# Patient Record
Sex: Male | Born: 1945
Health system: Southern US, Community
[De-identification: ages and names within clinical notes are randomized; demographics above are authoritative.]

## PROBLEM LIST (undated history)

## (undated) DIAGNOSIS — E1142 Type 2 diabetes mellitus with diabetic polyneuropathy: Secondary | ICD-10-CM

## (undated) DIAGNOSIS — G4730 Sleep apnea, unspecified: Secondary | ICD-10-CM

## (undated) DIAGNOSIS — IMO0002 Reserved for concepts with insufficient information to code with codable children: Secondary | ICD-10-CM

## (undated) DIAGNOSIS — I10 Essential (primary) hypertension: Secondary | ICD-10-CM

## (undated) DIAGNOSIS — F32A Depression, unspecified: Secondary | ICD-10-CM

## (undated) DIAGNOSIS — I2510 Atherosclerotic heart disease of native coronary artery without angina pectoris: Secondary | ICD-10-CM

## (undated) DIAGNOSIS — E669 Obesity, unspecified: Secondary | ICD-10-CM

## (undated) DIAGNOSIS — K219 Gastro-esophageal reflux disease without esophagitis: Secondary | ICD-10-CM

## (undated) DIAGNOSIS — E785 Hyperlipidemia, unspecified: Secondary | ICD-10-CM

## (undated) DIAGNOSIS — I4892 Unspecified atrial flutter: Secondary | ICD-10-CM

## (undated) DIAGNOSIS — F4329 Adjustment disorder with other symptoms: Secondary | ICD-10-CM

## (undated) DIAGNOSIS — M1990 Unspecified osteoarthritis, unspecified site: Secondary | ICD-10-CM

## (undated) DIAGNOSIS — E1165 Type 2 diabetes mellitus with hyperglycemia: Secondary | ICD-10-CM

## (undated) DIAGNOSIS — F329 Major depressive disorder, single episode, unspecified: Secondary | ICD-10-CM

## (undated) DIAGNOSIS — N529 Male erectile dysfunction, unspecified: Secondary | ICD-10-CM

## (undated) DIAGNOSIS — B0229 Other postherpetic nervous system involvement: Secondary | ICD-10-CM

## (undated) HISTORY — DX: Unspecified atrial flutter: I48.92

## (undated) HISTORY — DX: Reserved for concepts with insufficient information to code with codable children: IMO0002

## (undated) HISTORY — DX: Hyperlipidemia, unspecified: E78.5

## (undated) HISTORY — DX: Type 2 diabetes mellitus with hyperglycemia: E11.65

## (undated) HISTORY — PX: SHOULDER SURGERY: SHX246

## (undated) HISTORY — DX: Adjustment disorder with other symptoms: F43.29

## (undated) HISTORY — DX: Major depressive disorder, single episode, unspecified: F32.9

## (undated) HISTORY — DX: Other postherpetic nervous system involvement: B02.29

## (undated) HISTORY — DX: Male erectile dysfunction, unspecified: N52.9

## (undated) HISTORY — PX: REPLACEMENT TOTAL KNEE BILATERAL: SUR1225

## (undated) HISTORY — DX: Depression, unspecified: F32.A

## (undated) HISTORY — DX: Sleep apnea, unspecified: G47.30

## (undated) HISTORY — DX: Unspecified osteoarthritis, unspecified site: M19.90

## (undated) HISTORY — DX: Type 2 diabetes mellitus with diabetic polyneuropathy: E11.42

## (undated) HISTORY — DX: Obesity, unspecified: E66.9

## (undated) HISTORY — DX: Gastro-esophageal reflux disease without esophagitis: K21.9

## (undated) HISTORY — DX: Essential (primary) hypertension: I10

## (undated) HISTORY — DX: Atherosclerotic heart disease of native coronary artery without angina pectoris: I25.10

---

## 1976-11-06 ENCOUNTER — Encounter: Payer: Self-pay | Admitting: Cardiology

## 1994-03-19 HISTORY — PX: CORONARY ARTERY BYPASS GRAFT: SHX141

## 1995-03-07 ENCOUNTER — Encounter (INDEPENDENT_AMBULATORY_CARE_PROVIDER_SITE_OTHER): Payer: Self-pay | Admitting: *Deleted

## 1998-03-07 ENCOUNTER — Ambulatory Visit (HOSPITAL_COMMUNITY): Admission: RE | Admit: 1998-03-07 | Discharge: 1998-03-07 | Payer: Self-pay | Admitting: Cardiovascular Disease

## 1998-03-15 ENCOUNTER — Encounter: Payer: Self-pay | Admitting: Orthopedic Surgery

## 1998-03-22 ENCOUNTER — Inpatient Hospital Stay (HOSPITAL_COMMUNITY): Admission: RE | Admit: 1998-03-22 | Discharge: 1998-03-24 | Payer: Self-pay | Admitting: Orthopedic Surgery

## 1998-03-29 ENCOUNTER — Ambulatory Visit (HOSPITAL_COMMUNITY): Admission: RE | Admit: 1998-03-29 | Discharge: 1998-03-29 | Payer: Self-pay | Admitting: Orthopedic Surgery

## 1998-04-06 ENCOUNTER — Encounter: Admission: RE | Admit: 1998-04-06 | Discharge: 1998-07-05 | Payer: Self-pay | Admitting: Orthopedic Surgery

## 2000-03-28 LAB — HM COLONOSCOPY

## 2000-05-09 ENCOUNTER — Ambulatory Visit (HOSPITAL_COMMUNITY): Admission: RE | Admit: 2000-05-09 | Discharge: 2000-05-09 | Payer: Self-pay | Admitting: Gastroenterology

## 2000-05-09 ENCOUNTER — Encounter: Payer: Self-pay | Admitting: Gastroenterology

## 2001-07-07 ENCOUNTER — Encounter: Payer: Self-pay | Admitting: Orthopedic Surgery

## 2001-07-07 ENCOUNTER — Ambulatory Visit (HOSPITAL_COMMUNITY): Admission: RE | Admit: 2001-07-07 | Discharge: 2001-07-07 | Payer: Self-pay | Admitting: Cardiovascular Disease

## 2001-07-14 ENCOUNTER — Inpatient Hospital Stay (HOSPITAL_COMMUNITY): Admission: RE | Admit: 2001-07-14 | Discharge: 2001-07-18 | Payer: Self-pay | Admitting: Orthopedic Surgery

## 2001-08-05 ENCOUNTER — Encounter: Admission: RE | Admit: 2001-08-05 | Discharge: 2001-09-26 | Payer: Self-pay | Admitting: Orthopedic Surgery

## 2002-01-29 ENCOUNTER — Ambulatory Visit (HOSPITAL_COMMUNITY): Admission: RE | Admit: 2002-01-29 | Discharge: 2002-01-29 | Payer: Self-pay | Admitting: Cardiology

## 2002-01-29 ENCOUNTER — Encounter: Payer: Self-pay | Admitting: Cardiology

## 2002-03-04 ENCOUNTER — Encounter: Payer: Self-pay | Admitting: Internal Medicine

## 2002-03-04 ENCOUNTER — Encounter: Admission: RE | Admit: 2002-03-04 | Discharge: 2002-03-04 | Payer: Self-pay | Admitting: Internal Medicine

## 2004-05-17 HISTORY — PX: KNEE ARTHROSCOPY: SHX127

## 2004-06-05 ENCOUNTER — Ambulatory Visit: Payer: Self-pay | Admitting: Internal Medicine

## 2004-06-06 ENCOUNTER — Ambulatory Visit: Payer: Self-pay

## 2004-06-13 ENCOUNTER — Ambulatory Visit: Payer: Self-pay | Admitting: *Deleted

## 2004-06-14 ENCOUNTER — Ambulatory Visit (HOSPITAL_COMMUNITY): Admission: RE | Admit: 2004-06-14 | Discharge: 2004-06-14 | Payer: Self-pay | Admitting: Orthopedic Surgery

## 2005-02-13 ENCOUNTER — Ambulatory Visit: Payer: Self-pay | Admitting: Internal Medicine

## 2005-02-16 ENCOUNTER — Ambulatory Visit: Payer: Self-pay | Admitting: Internal Medicine

## 2005-03-14 ENCOUNTER — Ambulatory Visit: Payer: Self-pay | Admitting: *Deleted

## 2005-03-15 ENCOUNTER — Ambulatory Visit: Payer: Self-pay | Admitting: *Deleted

## 2005-03-16 ENCOUNTER — Ambulatory Visit: Payer: Self-pay | Admitting: Cardiology

## 2005-03-16 ENCOUNTER — Ambulatory Visit (HOSPITAL_COMMUNITY): Admission: RE | Admit: 2005-03-16 | Discharge: 2005-03-16 | Payer: Self-pay | Admitting: Cardiology

## 2005-03-21 ENCOUNTER — Ambulatory Visit: Payer: Self-pay | Admitting: Internal Medicine

## 2005-03-30 ENCOUNTER — Ambulatory Visit: Payer: Self-pay

## 2005-04-02 ENCOUNTER — Ambulatory Visit: Payer: Self-pay | Admitting: Internal Medicine

## 2005-04-02 ENCOUNTER — Ambulatory Visit: Admission: RE | Admit: 2005-04-02 | Discharge: 2005-04-02 | Payer: Self-pay | Admitting: Orthopedic Surgery

## 2005-04-16 ENCOUNTER — Ambulatory Visit: Payer: Self-pay | Admitting: Internal Medicine

## 2005-04-17 ENCOUNTER — Ambulatory Visit: Payer: Self-pay | Admitting: Internal Medicine

## 2005-04-26 ENCOUNTER — Ambulatory Visit: Payer: Self-pay | Admitting: Internal Medicine

## 2005-05-02 ENCOUNTER — Ambulatory Visit: Payer: Self-pay | Admitting: Internal Medicine

## 2005-05-02 ENCOUNTER — Inpatient Hospital Stay (HOSPITAL_COMMUNITY): Admission: RE | Admit: 2005-05-02 | Discharge: 2005-05-09 | Payer: Self-pay | Admitting: Orthopedic Surgery

## 2005-05-23 ENCOUNTER — Ambulatory Visit: Payer: Self-pay | Admitting: Internal Medicine

## 2005-06-05 ENCOUNTER — Ambulatory Visit: Payer: Self-pay | Admitting: Internal Medicine

## 2005-06-15 ENCOUNTER — Ambulatory Visit: Payer: Self-pay | Admitting: Internal Medicine

## 2005-07-18 ENCOUNTER — Encounter: Payer: Self-pay | Admitting: Cardiovascular Disease

## 2006-02-20 ENCOUNTER — Ambulatory Visit: Payer: Self-pay | Admitting: Internal Medicine

## 2006-02-25 ENCOUNTER — Ambulatory Visit: Payer: Self-pay | Admitting: Internal Medicine

## 2006-04-05 ENCOUNTER — Ambulatory Visit: Payer: Self-pay | Admitting: Internal Medicine

## 2006-06-10 DIAGNOSIS — E1139 Type 2 diabetes mellitus with other diabetic ophthalmic complication: Secondary | ICD-10-CM | POA: Insufficient documentation

## 2006-06-10 DIAGNOSIS — E1165 Type 2 diabetes mellitus with hyperglycemia: Secondary | ICD-10-CM

## 2006-06-17 ENCOUNTER — Ambulatory Visit: Payer: Self-pay | Admitting: Internal Medicine

## 2006-06-17 LAB — CONVERTED CEMR LAB
BUN: 13 mg/dL (ref 6–23)
CO2: 29 meq/L (ref 19–32)
Calcium: 9.1 mg/dL (ref 8.4–10.5)
Chloride: 106 meq/L (ref 96–112)
Cholesterol: 187 mg/dL (ref 0–200)
Creatinine, Ser: 0.8 mg/dL (ref 0.4–1.5)
Creatinine,U: 319.1 mg/dL
GFR calc Af Amer: 126 mL/min
GFR calc non Af Amer: 104 mL/min
Glucose, Bld: 196 mg/dL — ABNORMAL HIGH (ref 70–99)
HDL: 55.5 mg/dL (ref >39.0)
Hgb A1c MFr Bld: 12 % — ABNORMAL HIGH (ref 4.6–6.0)
LDL Cholesterol: 116 mg/dL — ABNORMAL HIGH (ref 0–99)
Microalb Creat Ratio: 13.2 mg/g (ref 0.0–30.0)
Microalb, Ur: 4.2 mg/dL — ABNORMAL HIGH (ref 0.0–1.9)
Potassium: 3.5 meq/L (ref 3.5–5.1)
Sodium: 142 meq/L (ref 135–145)
Total CHOL/HDL Ratio: 3.4
Triglycerides: 80 mg/dL (ref 0–149)
VLDL: 16 mg/dL (ref 0–40)

## 2006-10-09 ENCOUNTER — Encounter: Payer: Self-pay | Admitting: Internal Medicine

## 2006-10-09 DIAGNOSIS — E669 Obesity, unspecified: Secondary | ICD-10-CM | POA: Insufficient documentation

## 2006-10-09 DIAGNOSIS — K219 Gastro-esophageal reflux disease without esophagitis: Secondary | ICD-10-CM | POA: Insufficient documentation

## 2006-10-09 DIAGNOSIS — I10 Essential (primary) hypertension: Secondary | ICD-10-CM | POA: Insufficient documentation

## 2006-10-09 DIAGNOSIS — I2510 Atherosclerotic heart disease of native coronary artery without angina pectoris: Secondary | ICD-10-CM | POA: Insufficient documentation

## 2006-10-09 DIAGNOSIS — E785 Hyperlipidemia, unspecified: Secondary | ICD-10-CM | POA: Insufficient documentation

## 2007-02-10 ENCOUNTER — Ambulatory Visit: Payer: Self-pay | Admitting: Internal Medicine

## 2007-02-10 DIAGNOSIS — F528 Other sexual dysfunction not due to a substance or known physiological condition: Secondary | ICD-10-CM | POA: Insufficient documentation

## 2007-02-10 LAB — CONVERTED CEMR LAB
ALT: 24 units/L (ref 0–53)
AST: 19 units/L (ref 0–37)
Albumin: 4.1 g/dL (ref 3.5–5.2)
Alkaline Phosphatase: 74 units/L (ref 39–117)
BUN: 16 mg/dL (ref 6–23)
Bilirubin, Direct: 0.2 mg/dL (ref 0.0–0.3)
CO2: 29 meq/L (ref 19–32)
Calcium: 9.7 mg/dL (ref 8.4–10.5)
Chloride: 105 meq/L (ref 96–112)
Cholesterol: 169 mg/dL (ref 0–200)
Creatinine, Ser: 0.9 mg/dL (ref 0.4–1.5)
Creatinine,U: 195.4 mg/dL
GFR calc Af Amer: 110 mL/min
GFR calc non Af Amer: 91 mL/min
Glucose, Bld: 259 mg/dL — ABNORMAL HIGH (ref 70–99)
HDL: 54.9 mg/dL (ref >39.0)
Hgb A1c MFr Bld: 10 % — ABNORMAL HIGH (ref 4.6–6.0)
LDL Cholesterol: 103 mg/dL — ABNORMAL HIGH (ref 0–99)
Microalb Creat Ratio: 22.5 mg/g (ref 0.0–30.0)
Microalb, Ur: 4.4 mg/dL — ABNORMAL HIGH (ref 0.0–1.9)
Potassium: 4.7 meq/L (ref 3.5–5.1)
Sodium: 140 meq/L (ref 135–145)
TSH: 0.94 microintl units/mL (ref 0.35–5.50)
Testosterone: 466.74 ng/dL (ref 350.00–890)
Total Bilirubin: 1.1 mg/dL (ref 0.3–1.2)
Total CHOL/HDL Ratio: 3.1
Total Protein: 6.8 g/dL (ref 6.0–8.3)
Triglycerides: 54 mg/dL (ref 0–149)
VLDL: 11 mg/dL (ref 0–40)

## 2007-02-11 ENCOUNTER — Encounter: Payer: Self-pay | Admitting: Internal Medicine

## 2007-03-18 ENCOUNTER — Ambulatory Visit: Payer: Self-pay | Admitting: Internal Medicine

## 2007-05-27 ENCOUNTER — Ambulatory Visit: Payer: Self-pay | Admitting: Internal Medicine

## 2007-05-27 LAB — CONVERTED CEMR LAB
BUN: 19 mg/dL (ref 6–23)
Bacteria, UA: NEGATIVE
Bilirubin Urine: NEGATIVE
CO2: 32 meq/L (ref 19–32)
Calcium: 9.5 mg/dL (ref 8.4–10.5)
Chloride: 106 meq/L (ref 96–112)
Creatinine, Ser: 0.9 mg/dL (ref 0.4–1.5)
Crystals: NEGATIVE
GFR calc Af Amer: 110 mL/min
GFR calc non Af Amer: 91 mL/min
Glucose, Bld: 167 mg/dL — ABNORMAL HIGH (ref 70–99)
Hemoglobin, Urine: NEGATIVE
Hgb A1c MFr Bld: 8.3 % — ABNORMAL HIGH (ref 4.6–6.0)
Ketones, ur: NEGATIVE mg/dL
Leukocytes, UA: NEGATIVE
Nitrite: NEGATIVE
Potassium: 3.8 meq/L (ref 3.5–5.1)
RBC / HPF: NONE SEEN
Sodium: 143 meq/L (ref 135–145)
Specific Gravity, Urine: >=1.03 (ref 1.000–1.03)
Squamous Epithelial / LPF: NEGATIVE /lpf
Total Protein, Urine: NEGATIVE mg/dL
Urine Glucose: NEGATIVE mg/dL
Urobilinogen, UA: 0.2 (ref 0.0–1.0)
pH: 5.5 (ref 5.0–8.0)

## 2007-06-03 ENCOUNTER — Ambulatory Visit: Payer: Self-pay | Admitting: Internal Medicine

## 2007-06-12 ENCOUNTER — Telehealth: Payer: Self-pay | Admitting: Internal Medicine

## 2007-08-18 ENCOUNTER — Ambulatory Visit: Payer: Self-pay | Admitting: Internal Medicine

## 2007-08-20 ENCOUNTER — Encounter (INDEPENDENT_AMBULATORY_CARE_PROVIDER_SITE_OTHER): Payer: Self-pay | Admitting: *Deleted

## 2007-11-18 ENCOUNTER — Ambulatory Visit: Payer: Self-pay | Admitting: Internal Medicine

## 2007-11-18 DIAGNOSIS — H109 Unspecified conjunctivitis: Secondary | ICD-10-CM | POA: Insufficient documentation

## 2007-11-18 LAB — CONVERTED CEMR LAB
ALT: 28 units/L (ref 0–53)
AST: 22 units/L (ref 0–37)
BUN: 22 mg/dL (ref 6–23)
CO2: 29 meq/L (ref 19–32)
Calcium: 9.5 mg/dL (ref 8.4–10.5)
Chloride: 107 meq/L (ref 96–112)
Cholesterol: 158 mg/dL (ref 0–200)
Creatinine, Ser: 1 mg/dL (ref 0.4–1.5)
Creatinine,U: 167.5 mg/dL
GFR calc Af Amer: 97 mL/min
GFR calc non Af Amer: 80 mL/min
Glucose, Bld: 261 mg/dL — ABNORMAL HIGH (ref 70–99)
HDL: 51.5 mg/dL (ref >39.0)
LDL Cholesterol: 92 mg/dL (ref 0–99)
Microalb Creat Ratio: 4.8 mg/g (ref 0.0–30.0)
Microalb, Ur: 0.8 mg/dL (ref 0.0–1.9)
Potassium: 4.6 meq/L (ref 3.5–5.1)
Sodium: 140 meq/L (ref 135–145)
TSH: 0.9 microintl units/mL (ref 0.35–5.50)
Total CHOL/HDL Ratio: 3.1
Triglycerides: 75 mg/dL (ref 0–149)
VLDL: 15 mg/dL (ref 0–40)

## 2007-11-19 ENCOUNTER — Encounter: Payer: Self-pay | Admitting: Internal Medicine

## 2007-11-20 ENCOUNTER — Telehealth: Payer: Self-pay | Admitting: Internal Medicine

## 2008-02-09 ENCOUNTER — Telehealth (INDEPENDENT_AMBULATORY_CARE_PROVIDER_SITE_OTHER): Payer: Self-pay | Admitting: *Deleted

## 2008-02-11 ENCOUNTER — Ambulatory Visit: Payer: Self-pay | Admitting: Internal Medicine

## 2008-02-11 LAB — CONVERTED CEMR LAB: Hgb A1c MFr Bld: 8.7 % — ABNORMAL HIGH (ref 4.6–6.0)

## 2008-02-24 ENCOUNTER — Ambulatory Visit: Payer: Self-pay | Admitting: Internal Medicine

## 2008-02-24 DIAGNOSIS — E1165 Type 2 diabetes mellitus with hyperglycemia: Secondary | ICD-10-CM | POA: Insufficient documentation

## 2008-06-22 ENCOUNTER — Telehealth: Payer: Self-pay | Admitting: Internal Medicine

## 2008-07-01 ENCOUNTER — Encounter: Payer: Self-pay | Admitting: Internal Medicine

## 2008-07-02 ENCOUNTER — Encounter: Payer: Self-pay | Admitting: Internal Medicine

## 2008-08-23 ENCOUNTER — Telehealth: Payer: Self-pay | Admitting: Internal Medicine

## 2008-08-24 ENCOUNTER — Ambulatory Visit: Payer: Self-pay | Admitting: Internal Medicine

## 2008-08-24 LAB — CONVERTED CEMR LAB
ALT: 25 units/L (ref 0–53)
AST: 27 units/L (ref 0–37)
BUN: 19 mg/dL (ref 6–23)
CO2: 30 meq/L (ref 19–32)
Calcium: 8.9 mg/dL (ref 8.4–10.5)
Chloride: 113 meq/L — ABNORMAL HIGH (ref 96–112)
Cholesterol: 132 mg/dL (ref 0–200)
Creatinine, Ser: 0.8 mg/dL (ref 0.4–1.5)
GFR calc non Af Amer: 103.38 mL/min (ref >60)
Glucose, Bld: 107 mg/dL — ABNORMAL HIGH (ref 70–99)
HDL: 54.2 mg/dL (ref >39.00)
Hgb A1c MFr Bld: 8.4 % — ABNORMAL HIGH (ref 4.6–6.5)
LDL Cholesterol: 69 mg/dL (ref 0–99)
Potassium: 3.8 meq/L (ref 3.5–5.1)
Sodium: 144 meq/L (ref 135–145)
Total CHOL/HDL Ratio: 2
Triglycerides: 46 mg/dL (ref 0.0–149.0)
VLDL: 9.2 mg/dL (ref 0.0–40.0)

## 2008-08-25 ENCOUNTER — Ambulatory Visit: Payer: Self-pay | Admitting: Internal Medicine

## 2008-08-25 DIAGNOSIS — F4323 Adjustment disorder with mixed anxiety and depressed mood: Secondary | ICD-10-CM | POA: Insufficient documentation

## 2008-08-25 LAB — CONVERTED CEMR LAB
Creatinine,U: 174.4 mg/dL
Microalb Creat Ratio: 6.9 mg/g (ref 0.0–30.0)
Microalb, Ur: 1.2 mg/dL (ref 0.0–1.9)

## 2008-09-22 ENCOUNTER — Ambulatory Visit: Payer: Self-pay | Admitting: Internal Medicine

## 2008-09-22 ENCOUNTER — Ambulatory Visit (HOSPITAL_BASED_OUTPATIENT_CLINIC_OR_DEPARTMENT_OTHER): Admission: RE | Admit: 2008-09-22 | Discharge: 2008-09-22 | Payer: Self-pay | Admitting: Internal Medicine

## 2008-09-22 ENCOUNTER — Ambulatory Visit: Payer: Self-pay | Admitting: Diagnostic Radiology

## 2008-09-22 ENCOUNTER — Telehealth: Payer: Self-pay | Admitting: Internal Medicine

## 2008-09-22 DIAGNOSIS — S91109A Unspecified open wound of unspecified toe(s) without damage to nail, initial encounter: Secondary | ICD-10-CM | POA: Insufficient documentation

## 2008-11-19 ENCOUNTER — Ambulatory Visit: Payer: Self-pay | Admitting: Internal Medicine

## 2008-11-19 LAB — CONVERTED CEMR LAB
BUN: 18 mg/dL (ref 6–23)
CO2: 26 meq/L (ref 19–32)
Calcium: 9.5 mg/dL (ref 8.4–10.5)
Chloride: 104 meq/L (ref 96–112)
Creatinine, Ser: 0.88 mg/dL (ref 0.40–1.50)
Glucose, Bld: 146 mg/dL — ABNORMAL HIGH (ref 70–99)
Hgb A1c MFr Bld: 8.2 % — ABNORMAL HIGH (ref 4.6–6.1)
Microalb, Ur: 1.36 mg/dL (ref 0.00–1.89)
Potassium: 4.5 meq/L (ref 3.5–5.3)
Sodium: 142 meq/L (ref 135–145)
TSH: 1.4 microintl units/mL (ref 0.350–4.500)

## 2008-11-23 ENCOUNTER — Telehealth: Payer: Self-pay | Admitting: Internal Medicine

## 2008-11-25 ENCOUNTER — Ambulatory Visit: Payer: Self-pay | Admitting: Internal Medicine

## 2008-12-21 ENCOUNTER — Ambulatory Visit: Payer: Self-pay | Admitting: Internal Medicine

## 2008-12-21 DIAGNOSIS — L259 Unspecified contact dermatitis, unspecified cause: Secondary | ICD-10-CM | POA: Insufficient documentation

## 2008-12-24 ENCOUNTER — Ambulatory Visit: Payer: Self-pay | Admitting: Internal Medicine

## 2008-12-24 DIAGNOSIS — B029 Zoster without complications: Secondary | ICD-10-CM | POA: Insufficient documentation

## 2009-01-07 ENCOUNTER — Ambulatory Visit: Payer: Self-pay | Admitting: Internal Medicine

## 2009-01-13 ENCOUNTER — Ambulatory Visit: Payer: Self-pay | Admitting: Family

## 2009-01-13 DIAGNOSIS — B0229 Other postherpetic nervous system involvement: Secondary | ICD-10-CM | POA: Insufficient documentation

## 2009-01-27 ENCOUNTER — Telehealth: Payer: Self-pay | Admitting: Internal Medicine

## 2009-01-28 ENCOUNTER — Encounter: Admission: RE | Admit: 2009-01-28 | Discharge: 2009-01-28 | Payer: Self-pay | Admitting: Neurology

## 2009-01-30 ENCOUNTER — Emergency Department (HOSPITAL_COMMUNITY): Admission: EM | Admit: 2009-01-30 | Discharge: 2009-01-30 | Payer: Self-pay | Admitting: Emergency Medicine

## 2009-02-04 ENCOUNTER — Ambulatory Visit: Payer: Self-pay | Admitting: Internal Medicine

## 2009-02-15 ENCOUNTER — Encounter: Payer: Self-pay | Admitting: Internal Medicine

## 2009-02-16 ENCOUNTER — Encounter: Payer: Self-pay | Admitting: Internal Medicine

## 2009-02-17 ENCOUNTER — Ambulatory Visit: Payer: Self-pay | Admitting: Internal Medicine

## 2009-02-18 ENCOUNTER — Encounter: Payer: Self-pay | Admitting: Internal Medicine

## 2009-02-18 LAB — CONVERTED CEMR LAB
BUN: 16 mg/dL (ref 6–23)
CO2: 29 meq/L (ref 19–32)
Calcium: 8.9 mg/dL (ref 8.4–10.5)
Chloride: 104 meq/L (ref 96–112)
Creatinine, Ser: 0.87 mg/dL (ref 0.40–1.50)
Glucose, Bld: 178 mg/dL — ABNORMAL HIGH (ref 70–99)
Hgb A1c MFr Bld: 8.9 % — ABNORMAL HIGH (ref 4.6–6.1)
Potassium: 4.8 meq/L (ref 3.5–5.3)
Sodium: 142 meq/L (ref 135–145)

## 2009-02-24 ENCOUNTER — Ambulatory Visit: Payer: Self-pay | Admitting: Internal Medicine

## 2009-02-24 LAB — HM DIABETES FOOT EXAM

## 2009-03-15 ENCOUNTER — Telehealth: Payer: Self-pay | Admitting: Internal Medicine

## 2009-04-01 ENCOUNTER — Telehealth: Payer: Self-pay | Admitting: Internal Medicine

## 2009-05-13 ENCOUNTER — Encounter: Payer: Self-pay | Admitting: Internal Medicine

## 2009-05-17 ENCOUNTER — Ambulatory Visit: Payer: Self-pay | Admitting: Internal Medicine

## 2009-05-17 LAB — CONVERTED CEMR LAB
BUN: 17 mg/dL (ref 6–23)
CO2: 29 meq/L (ref 19–32)
Calcium: 9.2 mg/dL (ref 8.4–10.5)
Chloride: 103 meq/L (ref 96–112)
Creatinine, Ser: 0.84 mg/dL (ref 0.40–1.50)
Glucose, Bld: 89 mg/dL (ref 70–99)
Hgb A1c MFr Bld: 7.7 % — ABNORMAL HIGH (ref 4.6–6.1)
Potassium: 4.1 meq/L (ref 3.5–5.3)
Sodium: 143 meq/L (ref 135–145)

## 2009-05-24 ENCOUNTER — Ambulatory Visit: Payer: Self-pay | Admitting: Internal Medicine

## 2009-05-24 DIAGNOSIS — R0609 Other forms of dyspnea: Secondary | ICD-10-CM

## 2009-05-24 DIAGNOSIS — M79609 Pain in unspecified limb: Secondary | ICD-10-CM | POA: Insufficient documentation

## 2009-05-30 ENCOUNTER — Encounter: Payer: Self-pay | Admitting: Internal Medicine

## 2009-05-30 DIAGNOSIS — M1990 Unspecified osteoarthritis, unspecified site: Secondary | ICD-10-CM | POA: Insufficient documentation

## 2009-05-31 ENCOUNTER — Encounter: Payer: Self-pay | Admitting: Internal Medicine

## 2009-06-06 ENCOUNTER — Encounter: Payer: Self-pay | Admitting: Cardiology

## 2009-06-08 ENCOUNTER — Encounter: Payer: Self-pay | Admitting: Cardiology

## 2009-06-08 ENCOUNTER — Ambulatory Visit: Payer: Self-pay | Admitting: Cardiology

## 2009-06-08 DIAGNOSIS — R079 Chest pain, unspecified: Secondary | ICD-10-CM | POA: Insufficient documentation

## 2009-06-13 ENCOUNTER — Telehealth (INDEPENDENT_AMBULATORY_CARE_PROVIDER_SITE_OTHER): Payer: Self-pay | Admitting: *Deleted

## 2009-06-14 ENCOUNTER — Ambulatory Visit: Payer: Self-pay | Admitting: Cardiology

## 2009-06-14 ENCOUNTER — Ambulatory Visit (HOSPITAL_COMMUNITY): Admission: RE | Admit: 2009-06-14 | Discharge: 2009-06-14 | Payer: Self-pay | Admitting: Internal Medicine

## 2009-06-14 ENCOUNTER — Encounter: Payer: Self-pay | Admitting: Internal Medicine

## 2009-06-14 ENCOUNTER — Encounter (HOSPITAL_COMMUNITY): Admission: RE | Admit: 2009-06-14 | Discharge: 2009-08-17 | Payer: Self-pay | Admitting: Cardiology

## 2009-06-14 ENCOUNTER — Ambulatory Visit: Payer: Self-pay

## 2009-06-15 LAB — CONVERTED CEMR LAB: Pro B Natriuretic peptide (BNP): 31.1 pg/mL (ref 0.0–100.0)

## 2009-07-01 ENCOUNTER — Encounter: Payer: Self-pay | Admitting: Internal Medicine

## 2009-07-27 ENCOUNTER — Ambulatory Visit: Payer: Self-pay | Admitting: Internal Medicine

## 2009-07-27 ENCOUNTER — Encounter (INDEPENDENT_AMBULATORY_CARE_PROVIDER_SITE_OTHER): Payer: Self-pay | Admitting: *Deleted

## 2009-07-27 ENCOUNTER — Encounter: Payer: Self-pay | Admitting: Cardiology

## 2009-07-27 ENCOUNTER — Ambulatory Visit: Payer: Self-pay | Admitting: Cardiology

## 2009-07-27 DIAGNOSIS — N400 Enlarged prostate without lower urinary tract symptoms: Secondary | ICD-10-CM | POA: Insufficient documentation

## 2009-07-27 DIAGNOSIS — R51 Headache: Secondary | ICD-10-CM | POA: Insufficient documentation

## 2009-07-27 DIAGNOSIS — R9430 Abnormal result of cardiovascular function study, unspecified: Secondary | ICD-10-CM | POA: Insufficient documentation

## 2009-07-27 DIAGNOSIS — R519 Headache, unspecified: Secondary | ICD-10-CM | POA: Insufficient documentation

## 2009-07-29 ENCOUNTER — Encounter: Payer: Self-pay | Admitting: Cardiology

## 2009-08-12 ENCOUNTER — Ambulatory Visit (HOSPITAL_BASED_OUTPATIENT_CLINIC_OR_DEPARTMENT_OTHER): Admission: RE | Admit: 2009-08-12 | Discharge: 2009-08-12 | Payer: Self-pay | Admitting: Cardiology

## 2009-08-12 ENCOUNTER — Encounter: Payer: Self-pay | Admitting: Internal Medicine

## 2009-08-12 ENCOUNTER — Ambulatory Visit: Payer: Self-pay | Admitting: Diagnostic Radiology

## 2009-08-12 LAB — HM DIABETES EYE EXAM: HM Diabetic Eye Exam: NORMAL

## 2009-08-17 ENCOUNTER — Inpatient Hospital Stay (HOSPITAL_COMMUNITY)
Admission: RE | Admit: 2009-08-17 | Discharge: 2009-08-18 | Payer: Self-pay | Source: Home / Self Care | Admitting: Cardiovascular Disease

## 2009-08-17 ENCOUNTER — Ambulatory Visit: Payer: Self-pay | Admitting: Cardiovascular Disease

## 2009-08-17 ENCOUNTER — Encounter: Payer: Self-pay | Admitting: Cardiology

## 2009-08-17 ENCOUNTER — Encounter: Payer: Self-pay | Admitting: Internal Medicine

## 2009-08-17 ENCOUNTER — Inpatient Hospital Stay (HOSPITAL_BASED_OUTPATIENT_CLINIC_OR_DEPARTMENT_OTHER): Admission: RE | Admit: 2009-08-17 | Discharge: 2009-08-17 | Payer: Self-pay | Admitting: Cardiovascular Disease

## 2009-08-19 LAB — CONVERTED CEMR LAB
BUN: 22 mg/dL (ref 6–23)
CO2: 26 meq/L (ref 19–32)
Calcium: 9 mg/dL (ref 8.4–10.5)
Chloride: 104 meq/L (ref 96–112)
Creatinine, Ser: 0.88 mg/dL (ref 0.40–1.50)
Glucose, Bld: 241 mg/dL — ABNORMAL HIGH (ref 70–99)
HCT: 43.1 % (ref 39.0–52.0)
Hemoglobin: 14.7 g/dL (ref 13.0–17.0)
INR: 1.08 (ref <1.50)
MCHC: 34.1 g/dL (ref 30.0–36.0)
MCV: 101.9 fL — ABNORMAL HIGH (ref 78.0–100.0)
Platelets: 140 10*3/uL — ABNORMAL LOW (ref 150–400)
Potassium: 4.6 meq/L (ref 3.5–5.3)
Prothrombin Time: 13.9 s (ref 11.6–15.2)
RBC: 4.23 M/uL (ref 4.22–5.81)
RDW: 13.2 % (ref 11.5–15.5)
Sodium: 142 meq/L (ref 135–145)
WBC: 7 10*3/uL (ref 4.0–10.5)

## 2009-08-22 ENCOUNTER — Telehealth: Payer: Self-pay | Admitting: Internal Medicine

## 2009-08-22 ENCOUNTER — Telehealth: Payer: Self-pay | Admitting: Cardiovascular Disease

## 2009-08-22 ENCOUNTER — Telehealth: Payer: Self-pay | Admitting: Cardiology

## 2009-08-23 ENCOUNTER — Encounter: Payer: Self-pay | Admitting: Cardiovascular Disease

## 2009-08-26 ENCOUNTER — Ambulatory Visit: Payer: Self-pay | Admitting: Internal Medicine

## 2009-08-26 DIAGNOSIS — L723 Sebaceous cyst: Secondary | ICD-10-CM | POA: Insufficient documentation

## 2009-09-08 ENCOUNTER — Encounter (HOSPITAL_COMMUNITY): Admission: RE | Admit: 2009-09-08 | Discharge: 2009-12-07 | Payer: Self-pay | Admitting: Cardiovascular Disease

## 2009-09-12 ENCOUNTER — Ambulatory Visit (HOSPITAL_BASED_OUTPATIENT_CLINIC_OR_DEPARTMENT_OTHER): Admission: RE | Admit: 2009-09-12 | Discharge: 2009-09-12 | Payer: Self-pay | Admitting: Internal Medicine

## 2009-09-12 ENCOUNTER — Encounter: Payer: Self-pay | Admitting: Internal Medicine

## 2009-09-14 ENCOUNTER — Encounter: Payer: Self-pay | Admitting: Cardiology

## 2009-09-14 ENCOUNTER — Ambulatory Visit: Payer: Self-pay | Admitting: Cardiology

## 2009-09-14 ENCOUNTER — Encounter: Payer: Self-pay | Admitting: Internal Medicine

## 2009-09-14 DIAGNOSIS — G4733 Obstructive sleep apnea (adult) (pediatric): Secondary | ICD-10-CM | POA: Insufficient documentation

## 2009-09-23 ENCOUNTER — Telehealth (INDEPENDENT_AMBULATORY_CARE_PROVIDER_SITE_OTHER): Payer: Self-pay | Admitting: *Deleted

## 2009-09-24 ENCOUNTER — Ambulatory Visit: Payer: Self-pay | Admitting: Pulmonary Disease

## 2009-09-26 ENCOUNTER — Encounter: Payer: Self-pay | Admitting: Internal Medicine

## 2009-09-26 ENCOUNTER — Telehealth: Payer: Self-pay | Admitting: Cardiology

## 2009-10-18 ENCOUNTER — Telehealth: Payer: Self-pay | Admitting: Internal Medicine

## 2009-11-01 ENCOUNTER — Ambulatory Visit: Payer: Self-pay | Admitting: Pulmonary Disease

## 2009-11-04 ENCOUNTER — Encounter: Payer: Self-pay | Admitting: Pulmonary Disease

## 2009-11-22 ENCOUNTER — Telehealth: Payer: Self-pay | Admitting: Internal Medicine

## 2009-11-25 ENCOUNTER — Encounter: Payer: Self-pay | Admitting: Cardiology

## 2009-11-30 ENCOUNTER — Encounter: Payer: Self-pay | Admitting: Internal Medicine

## 2009-11-30 LAB — CONVERTED CEMR LAB
ALT: 17 units/L (ref 0–53)
AST: 18 units/L (ref 0–37)
BUN: 13 mg/dL (ref 6–23)
CO2: 28 meq/L (ref 19–32)
Calcium: 9 mg/dL (ref 8.4–10.5)
Chloride: 106 meq/L (ref 96–112)
Cholesterol: 141 mg/dL (ref 0–200)
Creatinine, Ser: 0.82 mg/dL (ref 0.40–1.50)
Glucose, Bld: 95 mg/dL (ref 70–99)
HDL: 46 mg/dL (ref >39)
Hgb A1c MFr Bld: 7.1 % — ABNORMAL HIGH (ref <5.7)
LDL Cholesterol: 79 mg/dL (ref 0–99)
Potassium: 4.2 meq/L (ref 3.5–5.3)
Sodium: 142 meq/L (ref 135–145)
Total CHOL/HDL Ratio: 3.1
Triglycerides: 78 mg/dL (ref <150)
VLDL: 16 mg/dL (ref 0–40)

## 2009-12-06 ENCOUNTER — Ambulatory Visit: Payer: Self-pay | Admitting: Internal Medicine

## 2009-12-08 ENCOUNTER — Encounter (HOSPITAL_COMMUNITY): Admission: RE | Admit: 2009-12-08 | Discharge: 2009-12-16 | Payer: Self-pay | Admitting: Cardiology

## 2009-12-09 ENCOUNTER — Telehealth: Payer: Self-pay | Admitting: Internal Medicine

## 2009-12-12 ENCOUNTER — Encounter: Payer: Self-pay | Admitting: Pulmonary Disease

## 2009-12-12 ENCOUNTER — Encounter: Payer: Self-pay | Admitting: Internal Medicine

## 2009-12-12 ENCOUNTER — Encounter: Payer: Self-pay | Admitting: Cardiology

## 2009-12-13 ENCOUNTER — Ambulatory Visit: Payer: Self-pay | Admitting: Pulmonary Disease

## 2009-12-17 ENCOUNTER — Encounter (HOSPITAL_COMMUNITY)
Admission: RE | Admit: 2009-12-17 | Discharge: 2010-03-17 | Payer: Self-pay | Source: Home / Self Care | Attending: Cardiology | Admitting: Cardiology

## 2009-12-19 ENCOUNTER — Telehealth (INDEPENDENT_AMBULATORY_CARE_PROVIDER_SITE_OTHER): Payer: Self-pay | Admitting: *Deleted

## 2010-01-22 ENCOUNTER — Encounter: Payer: Self-pay | Admitting: Physician Assistant

## 2010-01-25 ENCOUNTER — Encounter: Payer: Self-pay | Admitting: Pulmonary Disease

## 2010-01-26 ENCOUNTER — Telehealth: Payer: Self-pay | Admitting: Internal Medicine

## 2010-01-28 ENCOUNTER — Encounter: Payer: Self-pay | Admitting: Pulmonary Disease

## 2010-02-03 ENCOUNTER — Ambulatory Visit: Payer: Self-pay | Admitting: Internal Medicine

## 2010-02-07 ENCOUNTER — Encounter: Payer: Self-pay | Admitting: Physician Assistant

## 2010-02-07 ENCOUNTER — Ambulatory Visit: Payer: Self-pay | Admitting: Cardiovascular Disease

## 2010-02-16 ENCOUNTER — Telehealth (INDEPENDENT_AMBULATORY_CARE_PROVIDER_SITE_OTHER): Payer: Self-pay | Admitting: *Deleted

## 2010-02-16 ENCOUNTER — Telehealth: Payer: Self-pay | Admitting: Pulmonary Disease

## 2010-02-22 ENCOUNTER — Telehealth: Payer: Self-pay | Admitting: Internal Medicine

## 2010-02-27 ENCOUNTER — Encounter: Payer: Self-pay | Admitting: Internal Medicine

## 2010-02-28 ENCOUNTER — Encounter (INDEPENDENT_AMBULATORY_CARE_PROVIDER_SITE_OTHER): Payer: Self-pay | Admitting: *Deleted

## 2010-02-28 ENCOUNTER — Ambulatory Visit: Payer: Self-pay

## 2010-02-28 ENCOUNTER — Encounter: Payer: Self-pay | Admitting: Cardiology

## 2010-02-28 ENCOUNTER — Encounter (HOSPITAL_COMMUNITY)
Admission: RE | Admit: 2010-02-28 | Discharge: 2010-04-18 | Payer: Self-pay | Source: Home / Self Care | Attending: Cardiology | Admitting: Cardiology

## 2010-03-01 ENCOUNTER — Ambulatory Visit: Payer: Self-pay | Admitting: Cardiology

## 2010-03-02 ENCOUNTER — Ambulatory Visit
Admission: RE | Admit: 2010-03-02 | Discharge: 2010-03-02 | Disposition: A | Discharge status: Other Acute Inpatient Hospital | Payer: Self-pay | Source: Home / Self Care | Attending: Cardiology | Admitting: Cardiology

## 2010-03-02 ENCOUNTER — Inpatient Hospital Stay (HOSPITAL_COMMUNITY)
Admission: AD | Admit: 2010-03-02 | Discharge: 2010-03-04 | Disposition: A | Discharge status: Still In Hospital | Payer: Self-pay | Source: Home / Self Care | Attending: Cardiology | Admitting: Cardiology

## 2010-03-02 LAB — CONVERTED CEMR LAB
BUN: 21 mg/dL (ref 6–23)
Basophils Absolute: 0 10*3/uL (ref 0.0–0.1)
Basophils Relative: 0.1 % (ref 0.0–3.0)
CO2: 31 meq/L (ref 19–32)
Calcium: 9.7 mg/dL (ref 8.4–10.5)
Chloride: 103 meq/L (ref 96–112)
Creatinine, Ser: 0.8 mg/dL (ref 0.4–1.5)
Eosinophils Absolute: 0.2 10*3/uL (ref 0.0–0.7)
Eosinophils Relative: 2.5 % (ref 0.0–5.0)
GFR calc non Af Amer: 101.42 mL/min (ref >60.00)
Glucose, Bld: 259 mg/dL — ABNORMAL HIGH (ref 70–99)
HCT: 42.5 % (ref 39.0–52.0)
Hemoglobin: 14.4 g/dL (ref 13.0–17.0)
INR: 1 (ref 0.8–1.0)
Lymphocytes Relative: 20.2 % (ref 12.0–46.0)
Lymphs Abs: 1.4 10*3/uL (ref 0.7–4.0)
MCHC: 34 g/dL (ref 30.0–36.0)
MCV: 96 fL (ref 78.0–100.0)
Monocytes Absolute: 0.5 10*3/uL (ref 0.1–1.0)
Monocytes Relative: 6.9 % (ref 3.0–12.0)
Neutro Abs: 4.7 10*3/uL (ref 1.4–7.7)
Neutrophils Relative %: 70.3 % (ref 43.0–77.0)
Platelets: 131 10*3/uL — ABNORMAL LOW (ref 150.0–400.0)
Potassium: 4.5 meq/L (ref 3.5–5.1)
Prothrombin Time: 10.6 s (ref 9.7–11.8)
RBC: 4.43 M/uL (ref 4.22–5.81)
RDW: 15.4 % — ABNORMAL HIGH (ref 11.5–14.6)
Sodium: 140 meq/L (ref 135–145)
WBC: 6.7 10*3/uL (ref 4.5–10.5)

## 2010-03-03 ENCOUNTER — Encounter: Payer: Self-pay | Admitting: Cardiology

## 2010-03-08 ENCOUNTER — Encounter: Payer: Self-pay | Admitting: Cardiology

## 2010-03-08 ENCOUNTER — Ambulatory Visit: Payer: Self-pay | Admitting: Cardiology

## 2010-03-09 ENCOUNTER — Telehealth: Payer: Self-pay | Admitting: Cardiology

## 2010-03-09 ENCOUNTER — Encounter: Payer: Self-pay | Admitting: Cardiology

## 2010-03-09 ENCOUNTER — Ambulatory Visit: Payer: Self-pay | Admitting: Internal Medicine

## 2010-03-09 ENCOUNTER — Telehealth: Payer: Self-pay | Admitting: Internal Medicine

## 2010-03-09 LAB — CONVERTED CEMR LAB: Blood Glucose, Fingerstick: 255

## 2010-03-22 ENCOUNTER — Telehealth: Payer: Self-pay | Admitting: Internal Medicine

## 2010-03-23 ENCOUNTER — Telehealth: Payer: Self-pay | Admitting: Internal Medicine

## 2010-03-27 ENCOUNTER — Ambulatory Visit: Admit: 2010-03-27 | Payer: Self-pay | Admitting: Internal Medicine

## 2010-03-30 ENCOUNTER — Encounter: Payer: Self-pay | Admitting: Internal Medicine

## 2010-03-30 ENCOUNTER — Encounter: Payer: Self-pay | Admitting: Cardiology

## 2010-04-03 ENCOUNTER — Encounter: Payer: Self-pay | Admitting: Cardiology

## 2010-04-04 ENCOUNTER — Telehealth: Payer: Self-pay | Admitting: Internal Medicine

## 2010-04-07 ENCOUNTER — Telehealth: Payer: Self-pay | Admitting: Internal Medicine

## 2010-04-07 LAB — CONVERTED CEMR LAB
BUN: 17 mg/dL (ref 6–23)
CO2: 27 meq/L (ref 19–32)
Calcium: 9.2 mg/dL (ref 8.4–10.5)
Chloride: 106 meq/L (ref 96–112)
Cholesterol: 154 mg/dL (ref 0–200)
Creatinine, Ser: 0.86 mg/dL (ref 0.40–1.50)
Creatinine, Urine: 120 mg/dL
Glucose, Bld: 129 mg/dL — ABNORMAL HIGH (ref 70–99)
HDL: 44 mg/dL (ref >39)
Hgb A1c MFr Bld: 8.3 % — ABNORMAL HIGH (ref <5.7)
LDL Cholesterol: 95 mg/dL (ref 0–99)
Microalb Creat Ratio: 33.5 mg/g — ABNORMAL HIGH (ref 0.0–30.0)
Microalb, Ur: 4.02 mg/dL — ABNORMAL HIGH (ref 0.00–1.89)
Potassium: 4.6 meq/L (ref 3.5–5.3)
Sodium: 144 meq/L (ref 135–145)
Total CHOL/HDL Ratio: 3.5
Triglycerides: 75 mg/dL (ref <150)
VLDL: 15 mg/dL (ref 0–40)

## 2010-04-10 ENCOUNTER — Encounter: Payer: Self-pay | Admitting: Pulmonary Disease

## 2010-04-11 ENCOUNTER — Ambulatory Visit
Admission: RE | Admit: 2010-04-11 | Discharge: 2010-04-11 | Payer: Self-pay | Source: Home / Self Care | Attending: Internal Medicine | Admitting: Internal Medicine

## 2010-04-11 ENCOUNTER — Encounter: Payer: Self-pay | Admitting: Internal Medicine

## 2010-04-11 ENCOUNTER — Ambulatory Visit
Admission: RE | Admit: 2010-04-11 | Discharge: 2010-04-11 | Payer: Self-pay | Source: Home / Self Care | Attending: Pulmonary Disease | Admitting: Pulmonary Disease

## 2010-04-11 LAB — CONVERTED CEMR LAB: PSA: 0.24 ng/mL (ref <=4.00)

## 2010-04-19 ENCOUNTER — Encounter: Payer: Self-pay | Admitting: Cardiology

## 2010-04-19 ENCOUNTER — Ambulatory Visit (INDEPENDENT_AMBULATORY_CARE_PROVIDER_SITE_OTHER): Payer: Medicare Other | Admitting: Cardiology

## 2010-04-19 ENCOUNTER — Ambulatory Visit: Admit: 2010-04-19 | Payer: Self-pay | Admitting: Cardiology

## 2010-04-19 DIAGNOSIS — E7800 Pure hypercholesterolemia, unspecified: Secondary | ICD-10-CM

## 2010-04-19 DIAGNOSIS — I10 Essential (primary) hypertension: Secondary | ICD-10-CM

## 2010-04-19 DIAGNOSIS — I2510 Atherosclerotic heart disease of native coronary artery without angina pectoris: Secondary | ICD-10-CM

## 2010-04-20 NOTE — Assessment & Plan Note (Signed)
Summary: [facility]   Visit Type:  2 months follow up Primary Provider:  Dondra Spry DO  CC:  Some Sob.  History of Present Illness: 65 year old male with past medical history of coronary artery disease status post coronary artery bypass graft that I initially saw in March of 2011 for evaluation of dyspnea. Last cardiac catheterization in 2006 revealed an ejection fraction of 50%. There was a 50% left main. The LAD was occluded and the diagonal was severely diseased proximally. The LIMA to the LAD was patent. The circumflex had a marginal that was occluded. There was a second marginal with an 80-90% lesion. There is a totally occluded fourth marginal. The right coronary was occluded. The saphenous vein graft to the right coronary artery had a 40% lesion. The saphenous vein graft to the OM showed mild disease. Note previous carotid Dopplers in October of 2001 showed no significant stenosis. Previous abdominal ultrasound in 2003 showed no aneurysm. When I saw him previously a BNP was normal at 31. A Myoview in March of 2011revealed an ejection fraction of 47% and inferior ischemia. The RV was prominent. An echocardiogram in March of 2011 revealed normal LV function and mild left atrial enlargement. Since then he continues to have dyspnea on exertion relieved with rest. There is no orthopnea, PND or pedal edema. There is no chest pain. His dyspnea is slowly progressive.  Current Medications (verified): 1)  Lipitor 80 Mg  Tabs (Atorvastatin Calcium) .... Take 1 Tablet By Mouth Once A Day 2)  Metformin Hcl 1000 Mg  Tabs (Metformin Hcl) .... One By Mouth Bid 3)  Lantus Solostar 100 Unit/ml  Soln (Insulin Glargine) .... 30 Units in Am and 20 Units in Pm Subcutaneously 4)  Garlique 400 Mg  Tbec (Garlic) .[name].. 1 Once Daily 5)  Bd Ultra-Fine Pen Needles 29g X 12.37mm  Misc (Insulin Pen Needle) .... Use Three Times A Day As Directed 6)  Low-Dose Aspirin 81 Mg  Tabs (Aspirin) .... Take 1 Tablet By Mouth  Once A Day 7)  Lisinopril 20 Mg Tabs (Lisinopril) .... One By Mouth Once Daily 8)  Apidra Solostar 100 Unit/ml Soln (Insulin Glulisine) .... Use 10 - 15 Units Subcutaneously   Three Times A Day Before Meals 9)  Accu-Chek Aviva  Strp (Glucose Blood) .... For Three Times A Day Testing 10)  Omeprazole 20 Mg Cpdr (Omeprazole) .... One By Mouth Once Daily 11)  Cialis 20 Mg Tabs (Tadalafil) .... One By Mouth Once Daily As Needed 12)  Tamsulosin Hcl 0.4 Mg Caps (Tamsulosin Hcl) .... One By Mouth Qhs 13)  Lyrica 100 Mg Caps (Pregabalin) .... One By Mouth Three Times A Day 14)  Desipramine Hcl 25 Mg Tabs (Desipramine Hcl) .... Two At Bedtime 15)  Carbamazepine 200 Mg Tabs (Carbamazepine) .... Two Two Times A Day 16)  Miralax  Powd (Polyethylene Glycol 3350) .Marland KitchenMarland KitchenMarland Kitchen 17 Grams Mixed With Water Two Times A Day 17)  Lidoderm 5 % Ptch (Lidocaine) .... Apply Patch Q12 Hrs Once Daily At 9:00 Pm (Take Off 9:00 Am) 18)  Doxycycline Hyclate 100 Mg Tabs (Doxycycline Hyclate) .... One By Mouth Two Times A Day 19)  Finasteride 5 Mg Tabs (Finasteride) .... One By Mouth Once Daily  Allergies (verified): No Known Drug Allergies  Past History:  Past Medical History: HYPERTENSION (ICD-401.9) HYPERLIPIDEMIA (ICD-272.4) CORONARY ARTERY DISEASE (ICD-414.00) OSTEOARTHRITIS (ICD-715.90) POSTHERPETIC NEURALGIA (ICD-053.19) HERPES ZOSTER (ICD-053.9) CONTACT DERMATITIS (ICD-692.9) ADJUSTMENT DISORDER WITH MIXED FEATURES (ICD-309.28) DIABETIC PERIPHERAL NEUROPATHY (ICD-250.60) ERECTILE DYSFUNCTION (ICD-302.72) DIABETES  MELLITUS, TYPE II, UNCONTROLLED (ICD-250.02) OBESITY (ICD-278.00) GERD (ICD-530.81)   Past Surgical History: Reviewed history from 06/08/2009 and no changes required. coronary artery bypass graft in 1996 with a LIMA to the LAD, saphenous vein graft to the acute marginal, saphenous vein graft to the PDA and a saphenous vein graft to the circumflex. Right knee arthroscopic surgery in March of 2006 s/p  bilateral knee replacement Shoulder surgery  Social History: Reviewed history from 05/24/2009 and no changes required. Never Smoked Alcohol use-yes   Married Occupation: truck driver            Review of Systems       no fevers or chills, productive cough, hemoptysis, dysphasia, odynophagia, melena, hematochezia, dysuria, hematuria, rash, seizure activity, orthopnea, PND, pedal edema, claudication. Remaining systems are negative.   Vital Signs:  Patient profile:   65 year old male Height:      75 inches Weight:      253 pounds BMI:     31.74 Pulse rate:   96 / minute Pulse rhythm:   regular Resp:     18 per minute BP sitting:   136 / 80  (left arm) Cuff size:   large  Vitals Entered By: Vikki Ports (Jul 27, 2009 11:26 AM)  Physical Exam  General:  Well-developed well-nourished in no acute distress.  Skin is warm and dry.  HEENT is normal.  Neck is supple. No thyromegaly.  Chest is clear to auscultation with normal expansion.  Cardiovascular exam is regular rate and rhythm.  Abdominal exam nontender or distended. No masses palpated. Extremities show no edema. neuro grossly intact    Impression & Recommendations:  Problem # 1:  CARDIOVASCULAR STUDIES, ABNORMAL (ICD-794.30) The patient's nuclear study showed inferior ischemia. He continues to have significant dyspnea on exertion. Concerned about the possibility of anginal equivalent. I will schedule him for a right and left cardiac catheterization. The risks and benefits have been discussed and he agrees to proceed.  Problem # 2:  DYSPNEA (ICD-786.05)  Schedule right and left heart cardiac catheterization as described above. If no source identified then the patient may require pulmonary evaluation. He is being evaluated for sleep apnea. His updated medication list for this problem includes:    Low-dose Aspirin 81 Mg Tabs (Aspirin) .[name]... Take 1 tablet by mouth once a day    Lisinopril 20 Mg Tabs (Lisinopril) .....  One by mouth once daily  Orders: T-CBC No Diff ([id number]) T-Protime, Auto ([id number]) T-2 View CXR (71020TC) Cardiac Catheterization (Cardiac Cath)  Problem # 3:  HYPERTENSION (ICD-401.9)  Blood pressure controlled on present medications. Will continue. His updated medication list for this problem includes:    Low-dose Aspirin 81 Mg Tabs (Aspirin) .[name]... Take 1 tablet by mouth once a day    Lisinopril 20 Mg Tabs (Lisinopril) ..... One by mouth once daily  Orders: T-Basic Metabolic Panel [phone])  Problem # 4:  HYPERLIPIDEMIA (ICD-272.4) Continue statin. Lipids and liver monitored by primary care. His updated medication list for this problem includes:    Lipitor 80 Mg Tabs (Atorvastatin calcium) .[name]... Take 1 tablet by mouth once a day  Problem # 5:  CORONARY ARTERY DISEASE (ICD-414.00) Continue aspirin, ACE inhibitor and statin. His updated medication list for this problem includes:    Low-dose Aspirin 81 Mg Tabs (Aspirin) .[name]... Take 1 tablet by mouth once a day    Lisinopril 20 Mg Tabs (Lisinopril) ..... One by mouth once daily  Problem # 6:  DIABETES MELLITUS, TYPE II,  UNCONTROLLED (ICD-250.02) Management per primary care. His updated medication list for this problem includes:    Metformin Hcl 1000 Mg Tabs (Metformin hcl) ..... One by mouth bid    Lantus Solostar 100 Unit/ml Soln (Insulin glargine) .Marland KitchenMarland KitchenMarland KitchenMarland Kitchen 30 units in am and 20 units in pm subcutaneously    Low-dose Aspirin 81 Mg Tabs (Aspirin) .[name]... Take 1 tablet by mouth once a day    Lisinopril 20 Mg Tabs (Lisinopril) ..... One by mouth once daily    Apidra Solostar 100 Unit/ml Soln (Insulin glulisine) ..... Use 10 - 15 units subcutaneously   three times a day before meals  Patient Instructions: 1)  Your physician recommends that you schedule a follow-up appointment in: 3 MONTHS 2)  Your physician recommends that you return for lab work in: MAY 26TH 3)  A chest x-Privette takes a picture of the organs and structures  inside the chest, including the heart, lungs, and blood vessels. This test can show several things, including, whether the heart is enlarged; whether fluid is building up in the lungs; and whether pacemaker / defibrillator leads are still in place.MAY 26TH 4)  Your physician has requested that you have a cardiac catheterization.  Cardiac catheterization is used to diagnose and/or treat various heart conditions. Doctors may recommend this procedure for a number of different reasons. The most common reason is to evaluate chest pain. Chest pain can be a symptom of coronary artery disease (CAD), and cardiac catheterization can show whether plaque is narrowing or blocking your heart's arteries. This procedure is also used to evaluate the valves, as well as measure the blood flow and oxygen levels in different parts of your heart.  For further information please visit [email or web address]  Please follow instruction sheet, as given.

## 2010-04-20 NOTE — Progress Notes (Signed)
Summary: Phone note  Phone Note Call from Patient   Caller: Spouse Details for Reason: ? labs does he need to make a apt. to have done ? Summary of Call: Pt.'s wife called and made a f/u apt. but said they also need lab apt. a week before but I do not know what labs to put in the computer for. Tell me what labs to put in on this pt. and I will call pt. back and set up lab apt. or you can at [phone]. Thank you Initial call taken by: Michaelle Copas,  February 09, 2008 3:09 PM  Follow-up for Phone Call        hemoglobin A1c. Dx code: 76.02. Patient does not need to be fasting for this test. Follow-up by: Darra Lis RMA,  February 09, 2008 3:16 PM  Additional Follow-up for Phone Call Additional follow up Details #1::        I called pt. to tell him what labs he does need done and told him he does not have to fast. I seen in the computer where he has a lab apt. at [facility] office. Additional Follow-up by: Michaelle Copas,  February 10, 2008 8:42 AM    Additional Follow-up for Phone Call Additional follow up Details #2::    Noted. Follow-up by: Darra Lis RMA,  February 10, 2008 9:17 AM

## 2010-04-20 NOTE — Medication Information (Signed)
Summary: [facility] Supplies/[facility]  [facility]   Imported By: Lanelle Bal 04/12/2010 08:57:49  _____________________________________________________________________  External Attachment:    Type:   Image     Comment:   External Document

## 2010-04-20 NOTE — Progress Notes (Signed)
Summary: cant take beta blocker  Phone Note From Other Clinic Call back at [phone]   Caller: Nurse Request: Talk with Nurse, Talk with Provider Summary of Call: pt will be at cardiac rehab until 2:30 pt dosen't want to take beta blocker because it will cause erectile disfuction  Initial call taken by: Omer Jack,  September 26, 2009 1:32 PM  Follow-up for Phone Call        Patient does not want to take beta blocker. Ferman Hamming, MD, [facility]  September 26, 2009 5:15 PM  spoke with pt, he states he has taken metoprolol in the past and had trouble then. will cont to watch and call if any problems Deliah Goody, RN  September 26, 2009 5:48 PM

## 2010-04-20 NOTE — Assessment & Plan Note (Signed)
Summary: 1 month follow up/mhf--Rm 3   Vital Signs:  Patient profile:   65 year old male Height:      75 inches Weight:      245.50 pounds BMI:     30.80 Temp:     98.0 degrees F oral Pulse rate:   90 / minute Pulse rhythm:   regular Resp:     16 per minute BP sitting:   126 / 78  (left arm) Cuff size:   regular  Vitals Entered By: Mervin Kung CMA (August 26, 2009 3:12 PM) CC: 1 month follow up.  Hard knot on back x years. Needs 90 day supply on Lisinopril. Is Patient Diabetic? Yes   Primary Care Provider:  Dondra Spry DO  CC:  1 month follow up.  Hard knot on back x years. Needs 90 day supply on Lisinopril.[name]  History of Present Illness: 65 y/o white male f/u . ASA has changed to 325mg  daily.  Has started Omeprazole 20mg  1 daily and Effient 10mg  1 daily. Pt has stopped Lyrica due to headaches. States he stopped Carbamazapine because he is not having seizure symptoms any longer. post herpetic neuralgia is much better  reviewed cardiology notes  he complains knot on the middle of his back  Allergies: No Known Drug Allergies  Past History:  Past Medical History: HYPERTENSION (ICD-401.9) HYPERLIPIDEMIA (ICD-272.4) CORONARY ARTERY DISEASE (ICD-414.00)  OSTEOARTHRITIS (ICD-715.90) POSTHERPETIC NEURALGIA (ICD-053.19) HERPES ZOSTER (ICD-053.9) CONTACT DERMATITIS (ICD-692.9) ADJUSTMENT DISORDER WITH MIXED FEATURES (ICD-309.28) DIABETIC PERIPHERAL NEUROPATHY (ICD-250.60) ERECTILE DYSFUNCTION (ICD-302.72) DIABETES MELLITUS, TYPE II, UNCONTROLLED (ICD-250.02) OBESITY (ICD-278.00) GERD (ICD-530.81)   Past Surgical History: coronary artery bypass graft in 1996 with a LIMA to the LAD, saphenous vein graft to the acute marginal, saphenous vein graft to the PDA and a saphenous vein graft to the circumflex. Right knee arthroscopic surgery in March of 2006 s/p bilateral knee replacement  Shoulder surgery   Family History: Mother died of coronary artery disease at age  35 Father is alive at age 56 with coronary artery disease Half-brother who has a history of stroke              Social History: Never Smoked Alcohol use-yes    Married Occupation: truck Counsellor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   Neck:  supple and no carotid bruits.   Lungs:  normal respiratory effort and normal breath sounds.   Heart:  normal rate, regular rhythm, and no gallop.   Extremities:  trace left pedal edema and trace right pedal edema.   Neurologic:  cranial nerves II-XII intact and gait normal.   Skin:  2-3 cm cyst middle of back   Impression & Recommendations:  Problem # 1:  SEBACEOUS CYST (ICD-706.2) pt with sebaceous cyst middle of his back.  I advised to defer I & D for now considering pt on effient and aspirin.  call office if area appears infected  Complete Medication List: 1)  Crestor 40 Mg Tabs (Rosuvastatin calcium) .... One by mouth once daily 2)  Metformin Hcl 1000 Mg Tabs (Metformin hcl) .... One by mouth bid 3)  Lantus Solostar 100 Unit/ml Soln (Insulin glargine) .... 30 units in am and 20 units in pm subcutaneously 4)  Garlique 400 Mg Tbec (Garlic) .[name].. 1 once daily 5)  Bd Ultra-fine Pen Needles 29g X 12.60mm Misc (Insulin pen needle) .... Use three times a day as directed 6)  Low-dose Aspirin 81 Mg Tabs (Aspirin) .... Take 1 tablet by mouth once a day 7)  Lisinopril 20 Mg Tabs (Lisinopril) .... One by mouth once daily 8)  Apidra Solostar 100 Unit/ml Soln (Insulin glulisine) .... Use 10 - 15 units subcutaneously   three times a day before meals 9)  Accu-chek Aviva Strp (Glucose blood) .... For three times a day testing 10)  Omeprazole 20 Mg Cpdr (Omeprazole) .... One by mouth once daily 11)  Cialis 20 Mg Tabs (Tadalafil) .... One by mouth once daily as needed 12)  Tamsulosin Hcl 0.4 Mg Caps (Tamsulosin hcl) .... One by mouth qhs 13)  Desipramine Hcl 25 Mg Tabs (Desipramine hcl) .... Two at bedtime 14)   Miralax Powd (Polyethylene glycol 3350) .Marland KitchenMarland KitchenMarland Kitchen 17 grams mixed with water two times a day 15)  Lidoderm 5 % Ptch (Lidocaine) .... Apply patch q12 hrs once daily at 9:00 pm (take off 9:00 am) 16)  Doxycycline Hyclate 100 Mg Tabs (Doxycycline hyclate) .... One by mouth two times a day 17)  Finasteride 5 Mg Tabs (Finasteride) .... One by mouth once daily  Patient Instructions: 1)  Please schedule a follow-up appointment in 3 months. 2)  BMP prior to visit, ICD-9: 401.9 3)  AST, ALT Lipid Panel prior to visit, ICD-9: 272.4 4)  HbgA1C prior to visit, ICD-9: 250.02 5)  Please return for lab work one (1) week before your next appointment.  Prescriptions: CRESTOR 40 MG TABS (ROSUVASTATIN CALCIUM) one by mouth once daily  #90 x 1   Entered and Authorized by:   D. Thomos Lemons DO   Signed by:   D. Thomos Lemons DO on 08/26/2009   Method used:   Print then Give to Patient   RxID:   [id number] APIDRA SOLOSTAR 100 UNIT/ML SOLN (INSULIN GLULISINE) use 10 - 15 units subcutaneously   three times a day before meals  #90 days x 1   Entered and Authorized by:   D. Thomos Lemons DO   Signed by:   D. Thomos Lemons DO on 08/26/2009   Method used:   Print then Give to Patient   RxID:   [id number] LISINOPRIL 20 MG TABS (LISINOPRIL) one by mouth once daily  #90 x 1   Entered and Authorized by:   D. Thomos Lemons DO   Signed by:   D. Thomos Lemons DO on 08/26/2009   Method used:   Print then Give to Patient   RxID:   [id number] LANTUS SOLOSTAR 100 UNIT/ML  SOLN (INSULIN GLARGINE) 30 units in AM and 20 units in PM subcutaneously  #90 day x 1   Entered and Authorized by:   D. Thomos Lemons DO   Signed by:   D. Thomos Lemons DO on 08/26/2009   Method used:   Print then Give to Patient   RxID:   [id number]

## 2010-04-20 NOTE — Miscellaneous (Signed)
Summary: Orders Update  Clinical Lists Changes  Orders: Added new Test order of T-Basic Metabolic Panel [phone]) - Signed Added new Test order of TLB-ALT (SGPT) ([id number]) - Signed Added new Test order of TLB-AST (SGOT) ([id number]-[id number]) - Signed Added new Test order of T-Lipid Profile ([id number]) - Signed Added new Test order of T- Hemoglobin A1C ([id number]) - Signed

## 2010-04-20 NOTE — Progress Notes (Signed)
Summary: Lantus 90 Day Supply  Phone Note Refill Request Message from:  Fax from Pharmacy on August 22, 2009 12:14 PM  Refills Requested: Medication #1:  LANTUS SOLOSTAR 100 UNIT/ML  SOLN 30 units in AM and 20 units in PM subcutaneously   Dosage confirmed as above?Dosage Confirmed   Brand Name Necessary? No   Supply Requested: 3 months  Method Requested: Electronic Next Appointment Scheduled: 08/26/2009 @ [address] Artist Pais Initial call taken by: Glendell Docker CMA,  August 22, 2009 12:14 PM  Follow-up for Phone Call        Rx completed in Dr. Tiajuana Amass Follow-up by: Glendell Docker CMA,  August 22, 2009 12:15 PM    Prescriptions: LANTUS SOLOSTAR 100 UNIT/ML  SOLN (INSULIN GLARGINE) 30 units in AM and 20 units in PM subcutaneously  #90 x 1   Entered by:   Glendell Docker CMA   Authorized by:   D. Thomos Lemons DO   Signed by:   Glendell Docker CMA on 08/22/2009   Method used:   Electronically to        [facility] [address] (retail)       [address]       [place], [place]  [address]       Ph: [phone]       Fax: [phone]   RxID:   [id number]

## 2010-04-20 NOTE — Progress Notes (Signed)
Summary: Lantus Refill  Phone Note Refill Request Message from:  Fax from Pharmacy on November 23, 2008 11:58 AM  Refills Requested: Medication #1:  LANTUS SOLOSTAR 100 UNIT/ML  SOLN 30 Units in AM   Dosage confirmed as above?Dosage Confirmed   Brand Name Necessary? No   Supply Requested: 3 months  Method Requested: Electronic Next Appointment Scheduled: 11/26/2008 Initial call taken by: Glendell Docker CMA,  November 23, 2008 11:58 AM    Prescriptions: LANTUS SOLOSTAR 100 UNIT/ML  SOLN (INSULIN GLARGINE) 30 Units in AM,  20 units PM  #3 x 3   Entered by:   Glendell Docker CMA   Authorized by:   D. Thomos Lemons DO   Signed by:   Glendell Docker CMA on 11/23/2008   Method used:   Electronically to        [facility] [address] (retail)       [address]       [place], [place]  [address]       Ph: [phone]       Fax: [phone]   RxID:   [id number]

## 2010-04-20 NOTE — Assessment & Plan Note (Signed)
Summary: [facility]   Visit Type:  Post-hospital Primary Provider:  Dondra Spry DO  CC:  chest pain today.  History of Present Illness: 65 year old male with past medical history of coronary artery disease status post coronary artery bypass graft that I have seen recently for evaluation of dyspnea. Note previous carotid Dopplers in October of 2001 showed no significant stenosis. Previous abdominal ultrasound in 2003 showed no aneurysm. A recent BNP was normal at 31. A Myoview in March of 2011 revealed an ejection fraction of 47% and inferior ischemia. The RV was prominent. An echocardiogram in March of 2011 revealed normal LV function and mild left atrial enlargement. I last saw him in May of 2011 and we scheduled a cardiac catheterization which revealed triple-vessel coronary artery disease status post three-vessel CABG with 3/3 patent grafts. Severe stenosis in the proximal and distal body of the saphenous vein graft to the left-sided PDA. Normal left ventricular systolic function. Patient had PCI with a drug-eluting stent at that time. Since then he continues to have dyspnea on exertion relieved with rest. It is not associated with chest pain. There is no orthopnea, PND or pedal edema. He had a brief tingling in her left upper chest area but otherwise has not had exertional chest pain. He has been diagnosed with sleep apnea.  Current Medications (verified): 1)  Lipitor 80 Mg Tabs (Atorvastatin Calcium) .... Take One Tablet By Mouth Daily. 2)  Metformin Hcl 1000 Mg  Tabs (Metformin Hcl) .... One By Mouth Bid 3)  Lantus Solostar 100 Unit/ml  Soln (Insulin Glargine) .... 30 Units in Am and 20 Units in Pm Subcutaneously 4)  Garlique 400 Mg  Tbec (Garlic) .[name].. 1 Once Daily 5)  Bd Ultra-Fine Pen Needles 29g X 12.36mm  Misc (Insulin Pen Needle) .... Use Three Times A Day As Directed 6)  Aspirin Ec 325 Mg Tbec (Aspirin) .... Take One Tablet By Mouth Daily 7)  Lisinopril 20 Mg Tabs (Lisinopril)  .... One By Mouth Once Daily 8)  Apidra Solostar 100 Unit/ml Soln (Insulin Glulisine) .... Use 10 - 15 Units Subcutaneously   Three Times A Day Before Meals 9)  Accu-Chek Aviva  Strp (Glucose Blood) .... For Three Times A Day Testing 10)  Omeprazole 20 Mg Cpdr (Omeprazole) .... One By Mouth Once Daily 11)  Tamsulosin Hcl 0.4 Mg Caps (Tamsulosin Hcl) .... One By Mouth Qhs 12)  Desipramine Hcl 25 Mg Tabs (Desipramine Hcl) .... Two At Bedtime 13)  Miralax  Powd (Polyethylene Glycol 3350) .Marland KitchenMarland KitchenMarland Kitchen 17 Grams Mixed With Water Two Times A Day 14)  Finasteride 5 Mg Tabs (Finasteride) .... One By Mouth Once Daily 15)  Effient 10 Mg Tabs (Prasugrel Hcl) .... Take 1 Tablet By Mouth Once A Day 16)  Daily Multi  Tabs (Multiple Vitamins-Minerals) .... Take 1 Tablet By Mouth Once A Day  Allergies (verified): No Known Drug Allergies  Past History:  Past Medical History: HYPERTENSION (ICD-401.9) HYPERLIPIDEMIA (ICD-272.4) CORONARY ARTERY DISEASE (ICD-414.00)  OSTEOARTHRITIS (ICD-715.90) POSTHERPETIC NEURALGIA (ICD-053.19) HERPES ZOSTER (ICD-053.9) ADJUSTMENT DISORDER WITH MIXED FEATURES (ICD-309.28) DIABETIC PERIPHERAL NEUROPATHY (ICD-250.60) ERECTILE DYSFUNCTION (ICD-302.72) DIABETES MELLITUS, TYPE II, UNCONTROLLED (ICD-250.02) OBESITY (ICD-278.00) GERD (ICD-530.81)   Past Surgical History: Coronary artery bypass graft in 1996 with a LIMA to the LAD, saphenous vein graft to the acute marginal, saphenous vein graft to the PDA and a saphenous vein graft to the circumflex. Right knee arthroscopic surgery in March of 2006 s/p bilateral knee replacement  Shoulder surgery  Cardiac catheterization 5/11 revealed  60-70 LM and all native vessels occluded.cSaphenous vein graft to the first obtuse marginal was patent with mild irregularities in the body of the saphenous vein graft. The saphenous vein graft to the left-sided PDA was patent.  There was a severe 95% stenosis in the proximal body of the saphenous vein  graft.  There was a 90% stenosis in the distal body of the saphenous vein graft. The left internal mammary artery to the mid LAD was patent. The ejection fraction of 50%. Right heart cath revealed central aortic pressure 157/86, left ventricular pressure 150/16, left ventricular end-diastolic pressure 17, right atrial pressure 9, right ventricular pressure 29/7, right ventricular end-diastolic pressure 11, pulmonary artery pressure 31/13 with a mean of 22, pulmonary capillary wedge pressure 13, central aortic saturation 96%, pulmonary artery saturation 65%, cardiac output 4.9 L per minute. Cardiac index 2 L per minute per meter squared.  Social History: Reviewed history from 08/26/2009 and no changes required. Never Smoked Alcohol use-yes    Married Occupation: truck driver             Review of Systems       Problems with anxiety but no fevers or chills, productive cough, hemoptysis, dysphasia, odynophagia, melena, hematochezia, dysuria, hematuria, rash, seizure activity, orthopnea, PND, pedal edema, claudication. Remaining systems are negative.   Vital Signs:  Patient profile:   65 year old male Height:      75 inches Weight:      243.50 pounds BMI:     30.55 Pulse rate:   74 / minute Pulse rhythm:   regular Resp:     18 per minute BP sitting:   130 / 80  (left arm) Cuff size:   large  Vitals Entered By: Vikki Ports (September 14, 2009 12:20 PM)  Physical Exam  General:  Well-developed well-nourished in no acute distress.  Skin is warm and dry.  HEENT is normal.  Neck is supple. No thyromegaly.  Chest is clear to auscultation with normal expansion.  Cardiovascular exam is regular rate and rhythm.  Abdominal exam nontender or distended. No masses palpated. Right groin with no hematoma and no bruit. Extremities show no edema. neuro grossly intact    EKG  Procedure date:  09/14/2009  Findings:      Normal sinus rhythm at a rate of 74. Axis normal. Nonspecific ST  changes.  Impression & Recommendations:  Problem # 1:  DYSPNEA (ICD-786.05) Etiology unclear. We have now corrected with disease in his saphenous vein graft. Note his pulmonary wedge pressure was not elevated at the time of his right heart catheterization. There may be a component related to his sleep apnea. He will followup with Dr. Artist Pais for further management and may need a pulmonary evaluation. His updated medication list for this problem includes:    Aspirin Ec 325 Mg Tbec (Aspirin) .[name]... Take one tablet by mouth daily    Lisinopril 20 Mg Tabs (Lisinopril) ..... One by mouth once daily  Problem # 2:  HYPERTENSION (ICD-401.9) Blood pressure controlled on present medications. Will continue. His updated medication list for this problem includes:    Aspirin Ec 325 Mg Tbec (Aspirin) .[name]... Take one tablet by mouth daily    Lisinopril 20 Mg Tabs (Lisinopril) ..... One by mouth once daily  Problem # 3:  HYPERLIPIDEMIA (ICD-272.4) Continue statin. Lipids were monitored by primary care. His updated medication list for this problem includes:    Lipitor 80 Mg Tabs (Atorvastatin calcium) .[name]... Take one tablet by mouth daily.  Problem # 4:  CORONARY ARTERY DISEASE (ICD-414.00) Continue present medications including aspirin, effient, ACE inhibitor and statin. His updated medication list for this problem includes:    Aspirin Ec 325 Mg Tbec (Aspirin) .[name]... Take one tablet by mouth daily    Lisinopril 20 Mg Tabs (Lisinopril) ..... One by mouth once daily    Effient 10 Mg Tabs (Prasugrel hcl) .[name]... Take 1 tablet by mouth once a day  Problem # 5:  DIABETES MELLITUS, TYPE II, UNCONTROLLED (ICD-250.02)  His updated medication list for this problem includes:    Metformin Hcl 1000 Mg Tabs (Metformin hcl) ..... One by mouth bid    Lantus Solostar 100 Unit/ml Soln (Insulin glargine) .Marland KitchenMarland KitchenMarland KitchenMarland Kitchen 30 units in am and 20 units in pm subcutaneously    Aspirin Ec 325 Mg Tbec (Aspirin) .[name]... Take one tablet by mouth  daily    Lisinopril 20 Mg Tabs (Lisinopril) ..... One by mouth once daily    Apidra Solostar 100 Unit/ml Soln (Insulin glulisine) ..... Use 10 - 15 units subcutaneously   three times a day before meals  Problem # 6:  SLEEP APNEA, OBSTRUCTIVE (ICD-327.23) Will most likely need a pulmonary evaluation. He is scheduled to see primary care back.  Patient Instructions: 1)  Your physician recommends that you schedule a follow-up appointment in: 6 MONTHS Prescriptions: EFFIENT 10 MG TABS (PRASUGREL HCL) Take 1 tablet by mouth once a day  #90 x 3   Entered by:   Deliah Goody, RN   Authorized by:   Ferman Hamming, MD, [facility]   Signed by:   Deliah Goody, RN on 09/14/2009   Method used:   Print then Give to Patient   RxID:   [id number]

## 2010-04-20 NOTE — Assessment & Plan Note (Signed)
Summary: 3 mon f/u/hea   Vital Signs:  Patient profile:   65 year old male Weight:      250.25 pounds BMI:     31.39 Temp:     97.9 degrees F oral Pulse rate:   80 / minute Pulse rhythm:   regular Resp:     20 per minute BP sitting:   142 / 80  (right arm) Cuff size:   large  Vitals Entered By: Glendell Docker CMA (August 25, 2008 8:27 AM)  Primary Care Provider:  Dondra Spry DO  CC:  3 Month follow up disease management and Type 2 diabetes mellitus follow-up.  History of Present Illness: Type 2 Diabetes Mellitus Follow-Up      This is a 65 year old man who presents for Type 2 diabetes mellitus follow-up.  The patient reports self managed hypoglycemia, but denies polyuria, polydipsia, and hypoglycemia requiring help.  The patient denies the following symptoms: chest pain.  Since the last visit the patient reports poor dietary compliance, noncompliance with medications, and not exercising regularly.  The patient has been measuring capillary blood glucose before breakfast, before lunch, and at bedtime.    Hypertension Follow-Up      The patient also presents for Hypertension follow-up.  The patient denies lightheadedness, urinary frequency, and headaches.  The patient denies the following associated symptoms: chest pain.  Compliance with medications (by patient report) has been near 100%.  The patient reports that dietary compliance has been fair.  The patient reports no exercise.    Wife notes increased stress at home.    He is having trouble with tenants.  He is irritable.   He has impulsive behavior.   Wife is not happy.  ED -stable   Allergies (verified): No Known Drug Allergies  Past History:  Past Medical History: Coronary artery disease GERD  Hyperlipidemia Hypertension Diabetes mellitus, type II Erectile dysfunction    Past Surgical History: Coronary artery bypass graft Rotator cuff repair    Family History: Mother died of coronary artery disease at age 67  Father is alive at age 57 with coronary artery disease Half-brother who has a history of stroke   Social History: Never Smoked Alcohol use-yes Married Occupation: truck Hospital doctor      Review of Systems       The patient complains of weight gain.  The patient denies weight loss and chest pain.    Physical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   Head:  normocephalic and atraumatic.   Neck:  supple and no carotid bruits.   Lungs:  normal respiratory effort and normal breath sounds.   Heart:  normal rate, regular rhythm, and no gallop.   Abdomen:  soft and non-tender.   Extremities:  No lower extremity edema   Diabetes Management Exam:    Foot Exam (with socks and/or shoes not present):       Inspection:          Left foot: normal          Right foot: normal   Impression & Recommendations:  Problem # 1:  DIABETES MELLITUS, TYPE II, UNCONTROLLED (ICD-250.02) A1c is siightly improved.   He is having hypoglycemic episodes.   He does not have regular eating schedule.   He frequently indulges in sweets.  He is also not using Novolg regularly.   His wife has noticed increased irritability which may be related to hypoglycemia.    Decrease PM lantus dose.  I urged pt to improve dietary compliance and take short acting insulin before meals. His updated medication list for this problem includes:    Metformin Hcl 1000 Mg Tabs (Metformin hcl) ..... One by mouth bid    Lantus Solostar 100 Unit/ml Soln (Insulin glargine) .Marland KitchenMarland KitchenMarland KitchenMarland Kitchen 30 units in am,  20 units pm    Low-dose Aspirin 81 Mg Tabs (Aspirin) .[name]... Take 1 tablet by mouth once a day    Lisinopril 10 Mg Tabs (Lisinopril) ..... One by mouth qd    Novolog Flexpen 100 Unit/ml Soln (Insulin aspart) .Marland KitchenMarland KitchenMarland KitchenMarland Kitchen 10-15 units before dinner  Orders: Specimen Handling (04540) TLB-Microalbumin/Creat Ratio, Urine (82043-MALB)  Labs Reviewed: Creat: 0.8 (08/24/2008)     Last Eye Exam: Background diabetic retinopathy (11/19/2007)  Reviewed HgBA1c results: 8.4 (08/24/2008)  8.7 (02/11/2008)  Problem # 2:  HYPERTENSION (ICD-401.9) Pt reports BP better at home.   Maintain current medication regimen.  His updated medication list for this problem includes:    Lisinopril 10 Mg Tabs (Lisinopril) ..... One by mouth qd  BP today: 142/80 Prior BP: 128/80 (02/24/2008)  Labs Reviewed: K+: 3.8 (08/24/2008) Creat: : 0.8 (08/24/2008)   Chol: 132 (08/24/2008)   HDL: 54.20 (08/24/2008)   LDL: 69 (08/24/2008)   TG: 46.0 (08/24/2008)  Problem # 3:  ADJUSTMENT DISORDER WITH MIXED FEATURES (ICD-309.28) Assessment: Unchanged Patient experiencing increased stress due to  financial difficulty.   He has trouble sleeping. He also struggles with impulsive behavior. Patient may have attention deficit disorder. Consider start of Wellbutrin.  He has poor sleep.   I suggest he use gabapentin regularly for neuropathy symptoms which may improve sleep quality.  Problem # 4:  HYPERLIPIDEMIA (ICD-272.4) LDL at goal.   Maintain current medication regimen.  His updated medication list for this problem includes:    Lipitor 80 Mg Tabs (Atorvastatin calcium) .[name]... Take 1 tablet by mouth once a day  Labs Reviewed: SGOT: 27 (08/24/2008)   SGPT: 25 (08/24/2008)   HDL:54.20 (08/24/2008), 51.5 (11/18/2007)  LDL:69 (08/24/2008), 92 (11/18/2007)  Chol:132 (08/24/2008), 158 (11/18/2007)  Trig:46.0 (08/24/2008), 75 (11/18/2007)  Problem # 5:  ERECTILE DYSFUNCTION (ICD-302.72) Stable.   His updated medication list for this problem includes:    Cialis 20 Mg Tabs (Tadalafil) ..... One by mouth once daily as needed  Complete Medication List: 1)  Lipitor 80 Mg Tabs (Atorvastatin calcium) .... Take 1 tablet by mouth once a day 2)  Metformin Hcl 1000 Mg Tabs (Metformin hcl) .... One by mouth bid 3)  Lantus Solostar 100 Unit/ml Soln (Insulin glargine) .... 30 units in am,  20 units pm 4)  Garlique 400 Mg Tbec (Garlic) .[name].. 1 once daily 5)  Multivitamins Tabs  (Multiple vitamin) .... One by mouth once daily 6)  Bd Ultra-fine Pen Needles 29g X 12.68mm Misc (Insulin pen needle) .... Test blood sugar three times a day 7)  Cinnamon 500 Mg Caps (Cinnamon) .... Take 1 tablet by mouth two times a day 8)  Low-dose Aspirin 81 Mg Tabs (Aspirin) .... Take 1 tablet by mouth once a day 9)  Lisinopril 10 Mg Tabs (Lisinopril) .... One by mouth qd 10)  Novolog Flexpen 100 Unit/ml Soln (Insulin aspart) .[name].. 10-15 units before dinner 11)  Accu-chek Aviva Strp (Glucose blood) .... For three times a day testing 12)  Benefiber Tabs (Wheat dextrin) .... Take 1 tablet by mouth once a day 13)  Fish Oil 1000 Mg Caps (Omega-3 fatty acids) .... Take 1 tablet by mouth once a day 14)  Omeprazole  20 Mg Cpdr (Omeprazole) .... One by mouth once daily 15)  Gabapentin 300 Mg Caps (Gabapentin) .... One by mouth at bedtime prn 16)  Cialis 20 Mg Tabs (Tadalafil) .... One by mouth once daily as needed  Patient Instructions: 1)  Please schedule a follow-up appointment in 3 months. 2)  BMP prior to visit, ICD-9: 401.9 3)  HbgA1C prior to visit, ICD-9: 250.02 4)  Urine Microalbumin prior to visit, ICD-9: 250.02 5)  TSH:  272.4 6)  Please return for lab work one (1) week before your next appointment.  Prescriptions: NOVOLOG FLEXPEN 100 UNIT/ML  SOLN (INSULIN ASPART) 10-15 units before dinner  #3 x 3   Entered by:   Glendell Docker CMA   Authorized by:   D. Thomos Lemons DO   Signed by:   Glendell Docker CMA on 08/25/2008   Method used:   Faxed to ...       [facility] [address] (retail)       [address]       [place], [place]  [address]       Ph: [phone]       Fax: [phone]   RxID:   [id number] GABAPENTIN 300 MG CAPS (GABAPENTIN) one by mouth at bedtime prn  #90 x 1   Entered by:   Glendell Docker CMA   Authorized by:   D. Thomos Lemons DO   Signed by:   Glendell Docker CMA on 08/25/2008   Method used:   Faxed to ...       [facility] [address] (retail)       [address]       [place], [place]  [address]       Ph: [phone]       Fax: [phone]   RxID:   [id number] LANTUS SOLOSTAR 100 UNIT/ML  SOLN (INSULIN GLARGINE) 30 Units in AM,  20 units PM  #3 x 3   Entered by:   Glendell Docker CMA   Authorized by:   D. Thomos Lemons DO   Signed by:   Glendell Docker CMA on 08/25/2008   Method used:   Faxed to ...       [facility] [address] (retail)       [address]       [place], [place]  [address]       Ph: [phone]       Fax: [phone]   RxID:   [id number] NOVOLOG FLEXPEN 100 UNIT/ML  SOLN (INSULIN ASPART) 10-15 units before dinner  #1 month x 3   Entered and Authorized by:   D. Thomos Lemons DO   Signed by:   D. Thomos Lemons DO on 08/25/2008   Method used:   Print then Give to Patient   RxID:   [id number] LANTUS SOLOSTAR 100 UNIT/ML  SOLN (INSULIN GLARGINE) 30 Units in AM,  20 units PM  #1 month x 5   Entered and Authorized by:   D. Thomos Lemons DO   Signed by:   D. Thomos Lemons DO on 08/25/2008   Method used:   Print then Give to Patient   RxID:   [id number] CIALIS 20 MG TABS (TADALAFIL) one by mouth once daily as needed  #10 x 3   Entered and Authorized by:   D. Thomos Lemons DO   Signed by:   D. Thomos Lemons DO on 08/25/2008   Method used:   Print then Give to Patient   RxID:  [id number] OMEPRAZOLE 20 MG CPDR (OMEPRAZOLE) one by mouth once daily  #90 x 3   Entered and Authorized by:   D. Thomos Lemons DO   Signed by:   D. Thomos Lemons DO on 08/25/2008   Method used:   Print then Give to Patient   RxID:   [id number] GABAPENTIN 300 MG CAPS (GABAPENTIN) one by mouth at bedtime prn  #30 x 3   Entered and Authorized by:   D. Thomos Lemons DO   Signed by:   D. Thomos Lemons DO on 08/25/2008   Method used:   Print then Give to Patient   RxID:   [id number] LISINOPRIL 10 MG  TABS (LISINOPRIL) one by mouth qd  #90 x 3   Entered and Authorized by:   D. Thomos Lemons DO   Signed by:   D. Thomos Lemons DO on 08/25/2008   Method  used:   Print then Give to Patient   RxID:   [id number] ULTRA-FINE PEN NEEDLES 29G X 12.7MM  MISC (INSULIN PEN NEEDLE) test blood sugar three times a day  #120 x 5   Entered and Authorized by:   D. Thomos Lemons DO   Signed by:   D. Thomos Lemons DO on 08/25/2008   Method used:   Print then Give to Patient   RxID:   [id number] METFORMIN HCL 1000 MG  TABS (METFORMIN HCL) one by mouth bid  #180 x 3   Entered and Authorized by:   D. Thomos Lemons DO   Signed by:   D. Thomos Lemons DO on 08/25/2008   Method used:   Print then Give to Patient   RxID:   [id number] LIPITOR 80 MG  TABS (ATORVASTATIN CALCIUM) Take 1 tablet by mouth once a day  #90 x 3   Entered and Authorized by:   D. Thomos Lemons DO   Signed by:   D. Thomos Lemons DO on 08/25/2008   Method used:   Print then Give to Patient   RxID:   [id number]   Current Allergies (reviewed today): No known allergies

## 2010-04-20 NOTE — Medication Information (Signed)
Summary: [facility]  [facility]   Imported By: Lanelle Bal 07/06/2008 12:52:53  _____________________________________________________________________  External Attachment:    Type:   Image     Comment:   External Document

## 2010-04-20 NOTE — Assessment & Plan Note (Signed)
Summary: Cardiology Nuclear Testing  Nuclear Med Background Indications for Stress Test: Evaluation for Ischemia, Graft Patency, Stent Patency   History: CABG, Heart Catheterization, Myocardial Perfusion Study, Stents  History Comments: '96 CABG; 3/11 Echo:normal LVF; 3/11MPS:Infero-basal ischemia, EF=47%;  6/11 Stent>SVG-PDA  Symptoms: DOE, Rapid HR    Nuclear Pre-Procedure Cardiac Risk Factors: Family History - CAD, Hypertension, IDDM Type 2, Lipids, Obesity Caffeine/Decaff Intake: none NPO After: 10:00 PM IV 0.9% NS with Angio Cath: 22g     IV Site: R Wrist IV Started by: Cathlyn Parsons, RN Chest Size (in) 50     Height (in): 74 Weight (lb): 247 BMI: 31.83 Tech Comments: Patient held insulin this am but took Metformin.  BS 167 at 1215.  Nuclear Med Study 1 or 2 day study:  1 day     Stress Test Type:  Stress Reading MD:  Olga Millers, MD     Referring MD:  Olga Millers, MD Resting Radionuclide:  Technetium 22m Tetrofosmin     Resting Radionuclide Dose:  11.0 mCi  Stress Radionuclide:  Technetium 52m Tetrofosmin     Stress Radionuclide Dose:  33.0 mCi   Stress Protocol Exercise Time (min):  5:30 min     Max HR:  164 bpm     Predicted Max HR:  155 bpm  Max Systolic BP: 154 mm Hg     Percent Max HR:  105.81 %     METS: 7.2 Rate Pressure Product:  60454    Stress Test Technologist:  [employer]a [employer], CMA-N     Nuclear Technologist:  Doyne Keel, CNMT  Rest Procedure  Myocardial perfusion imaging was performed at rest 45 minutes following the intravenous administration of Technetium 73m Tetrofosmin.  Stress Procedure  The patient exercised for 5:30.  The patient stopped due to fatigue and dyspnea.  He denied any chest pain.  There were no diagnostic ST-T wave changes; only occasional PVC's and PAC's.  Technetium 28m Tetrofosmin was injected at peak exercise and myocardial perfusion imaging was performed after a brief delay.  QPS Raw Data Images:  There is no  interference from other nuclear activity. Stress Images:  There is decreased uptake in the inferior wall Rest Images:  There is decreased uptake in the inferior wall, less prominent compared to the stress images. Subtraction (SDS):  These findings are consistent with prior inferior infarct and mild peri-infarct ischemia. Transient Ischemic Dilatation:  0.97  (Normal <1.22)  Lung/Heart Ratio:  0.41  (Normal <0.45)  Quantitative Gated Spect Images QGS EDV:  124 ml QGS ESV:  65 ml QGS EF:  48 % QGS cine images:  Inferior akinesis; left ventricular enlargement.   Overall Impression  Exercise Capacity: Fair exercise capacity. BP Response: Normal blood pressure response. Clinical Symptoms: No chest pain ECG Impression: Significant ST abnormalities consistent with ischemia (transient ST elevation in the inferior leads). Overall Impression: Abnormal stress nuclear study with prior inferior infarct and mild peri-infarct ischemia.

## 2010-04-20 NOTE — Letter (Signed)
Summary: [facility]  [facility]   Imported By: Lanelle Bal 02/21/2009 12:26:44  _____________________________________________________________________  External Attachment:    Type:   Image     Comment:   External Document

## 2010-04-20 NOTE — Progress Notes (Signed)
  Phone Note Refill Request   Refills Requested: Medication #1:  BD ULTRA-FINE PEN NEEDLES 29G X 12.7MM  MISC test blood sugar three times a day  Medication #2:  NOVOLOG FLEXPEN 100 UNIT/ML  SOLN 10-15 units before dinner Initial call taken by: Lamar Sprinkles,  June 12, 2007 4:14 PM      Prescriptions: BD ULTRA-FINE PEN NEEDLES 29G X 12.7MM  MISC (INSULIN PEN NEEDLE) test blood sugar three times a day  #1 mth x 0   Entered by:   Lamar Sprinkles   Authorized by:   D. Thomos Lemons DO   Signed by:   Lamar Sprinkles on 06/12/2007   Method used:   Telephoned to ...       [facility] [phone]*       [address]       [place], [place]  [address]       Ph: [phone] or [phone]       Fax: [phone]   RxID:   [id number] NOVOLOG FLEXPEN 100 UNIT/ML  SOLN (INSULIN ASPART) 10-15 units before dinner  #53mth x 0   Entered by:   Lamar Sprinkles   Authorized by:   D. Thomos Lemons DO   Signed by:   Lamar Sprinkles on 06/12/2007   Method used:   Telephoned to ...       [facility] [phone]*       [address]       [place], [place]  [address]       Ph: [phone] or [phone]       Fax: [phone]   RxID:   [id number]

## 2010-04-20 NOTE — Assessment & Plan Note (Signed)
Summary: F/U DIABETES/$50/JK   Vital Signs:  Patient Profile:   65 Years Old Male Height:     75 inches Weight:      245.25 pounds BMI:     30.76 Temp:     97.3 degrees F oral Pulse rate:   67 / minute BP sitting:   130 / 79  (right arm)  Vitals Entered By: Glendell Docker (August 18, 2007 8:14 AM)                 Chief Complaint:  FOLLOW UP DIABETES MANAGEMENT and Type 2 diabetes mellitus follow-up.  History of Present Illness:  Type 2 Diabetes Mellitus Follow-Up      This is a 65 year old man who presents for Type 2 diabetes mellitus follow-up.  The patient reports self managed hypoglycemia, but denies hypoglycemia requiring help, weight loss, and weight gain.  The patient denies the following symptoms: neuropathic pain and chest pain.  Since the last visit the patient reports fair dietary compliance and monitoring blood glucose.  The patient has been measuring capillary blood glucose before breakfast.  His usual reading are between 150-180.    Current Allergies (reviewed today): No known allergies   Past Medical History:    Reviewed history from 03/18/2007 and no changes required:       Coronary artery disease       GERD       Hyperlipidemia       Hypertension       Diabetes mellitus, type II       Erectile dysfunction   Social History:    Reviewed history from 03/18/2007 and no changes required:       Never Smoked       Alcohol use-yes       Married       Occupation: truck Hospital doctor    Review of Systems      See HPI   Physical Exam  General:     alert, well-developed, and well-nourished.   Neck:     supple and no carotid bruits.   Lungs:     normal respiratory effort and normal breath sounds.   Heart:     normal rate, regular rhythm, and no gallop.   Abdomen:     soft and non-tender.   Extremities:     No lower extremity edema  Neurologic:     alert & oriented X3 and cranial nerves II-XII intact.   Skin:     turgor normal and color normal.    Psych:     normally interactive and good eye contact.      Impression & Recommendations:  Problem # 1:  DIABETES MELLITUS, TYPE II, UNCONTROLLED (ICD-250.02) Pt not using Novolog on a consistent basis.  I urged compliance.  Refer to Dr. Lahoma Crocker for routine diabetic eye exam.  (He does not want to go back to [name])  His updated medication list for this problem includes:    Metformin Hcl 1000 Mg Tabs (Metformin hcl) ..... One by mouth bid    Lantus Solostar 100 Unit/ml Soln (Insulin glargine) .Marland KitchenMarland KitchenMarland KitchenMarland Kitchen 30 u two times a day    Low-dose Aspirin 81 Mg Tabs (Aspirin) .[name]... Take 1 tablet by mouth once a day    Lisinopril 10 Mg Tabs (Lisinopril) ..... One by mouth qd    Novolog Flexpen 100 Unit/ml Soln (Insulin aspart) .Marland KitchenMarland KitchenMarland KitchenMarland Kitchen 10-15 units before dinner  Orders: Ophthalmology Referral (Ophthalmology)  Labs Reviewed: HgBA1c: 8.3 (05/27/2007)   Creat:  0.9 (05/27/2007)      Problem # 2:  HYPERTENSION (ICD-401.9) BP stable. Maintain current medication regimen.  His updated medication list for this problem includes:    Lisinopril 10 Mg Tabs (Lisinopril) ..... One by mouth qd  BP today: 130/79 Prior BP: 129/80 (06/03/2007)  Labs Reviewed: Creat: 0.9 (05/27/2007) Chol: 169 (02/10/2007)   HDL: 54.9 (02/10/2007)   LDL: 103 (02/10/2007)   TG: 54 (02/10/2007)   Problem # 3:  HYPERLIPIDEMIA (ICD-272.4) Continue lipitor.  LFT's and FLP before next visit.   His updated medication list for this problem includes:    Lipitor 80 Mg Tabs (Atorvastatin calcium) .[name]... Take 1 tablet by mouth once a day  Labs Reviewed: Chol: 169 (02/10/2007)   HDL: 54.9 (02/10/2007)   LDL: 103 (02/10/2007)   TG: 54 (02/10/2007) SGOT: 19 (02/10/2007)   SGPT: 24 (02/10/2007)   Complete Medication List: 1)  Lipitor 80 Mg Tabs (Atorvastatin calcium) .... Take 1 tablet by mouth once a day 2)  Metformin Hcl 1000 Mg Tabs (Metformin hcl) .... One by mouth bid 3)  Lantus Solostar 100 Unit/ml Soln (Insulin  glargine) .... 30 u two times a day 4)  Garlique 400 Mg Tbec (Garlic) .[name].. 1 once daily 5)  Multivitamins Tabs (Multiple vitamin) .... One by mouth once daily 6)  Bd Ultra-fine Pen Needles 29g X 12.1mm Misc (Insulin pen needle) .... Test blood sugar three times a day 7)  Cinnamon 500 Mg Caps (Cinnamon) .... Take 1 tablet by mouth two times a day 8)  Low-dose Aspirin 81 Mg Tabs (Aspirin) .... Take 1 tablet by mouth once a day 9)  Lisinopril 10 Mg Tabs (Lisinopril) .... One by mouth qd 10)  Novolog Flexpen 100 Unit/ml Soln (Insulin aspart) .[name].. 10-15 units before dinner 11)  Freestyle Lite Strp (Glucose blood) .... For three times a day testing   Patient Instructions: 1)  Please schedule a follow-up appointment in 3 months. 2)  BMP prior to visit, ICD-9:  401.9 3)  AST, ALTl prior to visit, ICD-9:  272.4 4)  Lipid Panel prior to visit, ICD-9: 272.4 5)  HbgA1C prior to visit, ICD-9:  250.02 6)  Urine Microalbumin prior to visit, ICD-9: 250.02   Prescriptions: NOVOLOG FLEXPEN 100 UNIT/ML  SOLN (INSULIN ASPART) 10-15 units before dinner  #35mth x 5   Entered and Authorized by:   D. Thomos Lemons DO   Signed by:   D. Thomos Lemons DO on 08/18/2007   Method used:   Print then Give to Patient   RxID:   [id number] FREESTYLE LITE   STRP (GLUCOSE BLOOD) for three times a day testing  #100 x 5   Entered and Authorized by:   D. Thomos Lemons DO   Signed by:   D. Thomos Lemons DO on 08/18/2007   Method used:   Print then Give to Patient   RxID:   [id number] LISINOPRIL 10 MG  TABS (LISINOPRIL) one by mouth qd  #90 x 3   Entered and Authorized by:   D. Thomos Lemons DO   Signed by:   D. Thomos Lemons DO on 08/18/2007   Method used:   Print then Give to Patient   RxID:   [id number] BD ULTRA-FINE PEN NEEDLES 29G X 12.7MM  MISC (INSULIN PEN NEEDLE) test blood sugar three times a day  #1 mth x 5   Entered and Authorized by:   D. Thomos Lemons DO   Signed by:   D. Thomos Lemons DO on 08/18/2007  Method  used:   Print then Give to Patient   RxID:   [id number] LANTUS SOLOSTAR 100 UNIT/ML  SOLN (INSULIN GLARGINE) 30 U two times a day  #3 boxes x 3   Entered and Authorized by:   D. Thomos Lemons DO   Signed by:   D. Thomos Lemons DO on 08/18/2007   Method used:   Print then Give to Patient   RxID:   [id number] METFORMIN HCL 1000 MG  TABS (METFORMIN HCL) one by mouth bid  #180 x 3   Entered and Authorized by:   D. Thomos Lemons DO   Signed by:   D. Thomos Lemons DO on 08/18/2007   Method used:   Print then Give to Patient   RxID:   [id number]  ] Current Allergies (reviewed today): No known allergies

## 2010-04-20 NOTE — Progress Notes (Signed)
Summary: Lisinopril Refill  Phone Note Refill Request Message from:  Fax from Pharmacy on February 22, 2010 8:04 AM  Refills Requested: Medication #1:  LISINOPRIL 20 MG TABS one by mouth once daily   Dosage confirmed as above?Dosage Confirmed   Brand Name Necessary? No   Supply Requested: 3 months   [facility]: [facility] [address] [place] [facility] [address] FAX [phone]   Method Requested: Electronic Next Appointment Scheduled: 02-28-10 CARDIOLOGY  Initial call taken by: Roselle Locus,  February 22, 2010 8:05 AM  Follow-up for Phone Call        Rx completed in Dr. Tiajuana Amass Follow-up by: Glendell Docker CMA,  February 22, 2010 8:29 AM    Prescriptions: LISINOPRIL 20 MG TABS (LISINOPRIL) one by mouth once daily  #90 x 1   Entered by:   Glendell Docker CMA   Authorized by:   D. Thomos Lemons DO   Signed by:   Glendell Docker CMA on 02/22/2010   Method used:   Electronically to        [facility] [address] [facility] [address] (retail)       [address]       [place], [place]  [address]       Ph: [phone]       Fax: [phone]   RxID:   [id number]

## 2010-04-20 NOTE — Assessment & Plan Note (Signed)
Summary: f/u - Dr. Dione Booze ( Eye Dr.)shingles in eye & face- jr   Vital Signs:  Patient profile:   65 year old male Weight:      241 pounds BMI:     30.23 Temp:     98.3 degrees F oral Pulse rate:   88 / minute Pulse rhythm:   regular Resp:     18 per minute BP sitting:   120 / 80  (right arm) Cuff size:   large  Vitals Entered By: Glendell Docker CMA (December 24, 2008 9:50 AM)  Primary Care Provider:  Dondra Spry DO  CC:  Follow up from Dr Dione Booze.  History of Present Illness: 65 y/o seen by Dr. Dione Booze.  Pt later develop right facial rash and was diagnosed with shingles. His right eye much more swollen.  He reports significant right sided fascial pain. He is on gabapentin but still having pain.   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   Head:  right sided facial rash and swelling Neck:  supple and no carotid bruits.   Lungs:  normal respiratory effort and normal breath sounds.   Heart:  normal rate, regular rhythm, and no gallop.     Impression & Recommendations:  Problem # 1:  HERPES ZOSTER (ICD-053.9) Herpes zoster of right side of face.  He developed fascial rash between last Tues and Wed.   He was started on Valtrex by Dr. Dione Booze.  Pt having significant fascial pain.  Start lyrica 75 mg by mouth two times a day.  Titrate dose q weekly as needed.  Rx for percocet provided in case of severe  unremitting pain.  He has f/u with Dr. Dione Booze next week.  Complete Medication List: 1)  Lipitor 80 Mg Tabs (Atorvastatin calcium) .... Take 1 tablet by mouth once a day 2)  Metformin Hcl 1000 Mg Tabs (Metformin hcl) .... One by mouth bid 3)  Lantus Solostar 100 Unit/ml Soln (Insulin glargine) .... 30 units in am,  20 units pm 4)  Garlique 400 Mg Tbec (Garlic) .[name].. 1 once daily 5)  Multivitamins Tabs (Multiple vitamin) .... One by mouth once daily 6)  Bd Ultra-fine Pen Needles 29g X 12.88mm Misc (Insulin pen needle) .... Use three times a day as directed 7)  Cinnamon 500 Mg Caps (Cinnamon) .... Take 1 tablet by mouth two times a day 8)  Low-dose Aspirin 81 Mg Tabs (Aspirin) .... Take 1 tablet by mouth once a day 9)  Lisinopril 10 Mg Tabs (Lisinopril) .... One by mouth qd 10)  Apidra Solostar 100 Unit/ml Soln (Insulin glulisine) .... Use 10-20 units before evening meal 11)  Accu-chek Aviva Strp (Glucose blood) .... For three times a day testing 12)  Fish Oil 1000 Mg Caps (Omega-3 fatty acids) .... Take 1 tablet by mouth once a day 13)  Omeprazole 20 Mg  Cpdr (Omeprazole) .... One by mouth once daily 14)  Cialis 20 Mg Tabs (Tadalafil) .... One by mouth once daily as needed 15)  Tamsulosin Hcl 0.4 Mg Caps (Tamsulosin hcl) .... One by mouth qhs 16)  Neomycin-polymyxin-dexameth 5-10000-0.1 Susp (Neomycin-polymyxin-dexameth) .... 2 gtts to right eye 4 x daily 17)  Triamcinolone Acetonide 0.5 % Crea (Triamcinolone acetonide) .... Apply two times a day 18)  Hydrocodone-acetaminophen 5-500 Mg Tabs (Hydrocodone-acetaminophen) .... One tablet by mouth every 4 hours as needed pain 19)  Darvocet-n 100 100-650 Mg Tabs (Propoxyphene n-apap) .[name].. 1 tablet every 4 hours by mouth as needed pain 20)  Valtrex 500 Mg Tabs (Valacyclovir hcl) .... 2 pills by mouth three times a day x 7 days 21)  Lyrica 75 Mg Caps (Pregabalin) .... One by mouth two times a day 22)  Oxycodone-acetaminophen 5-325 Mg Tabs  (Oxycodone-acetaminophen) .... One by mouth two times a day prn  Patient Instructions: 1)  Please schedule a follow-up appointment in 2 weeks. 2)  Call our office if your symptoms do not  improve or gets worse. Prescriptions: OXYCODONE-ACETAMINOPHEN 5-325 MG TABS (OXYCODONE-ACETAMINOPHEN) one by mouth two times a day prn  #30 x 0   Entered and Authorized by:   D. Thomos Lemons DO   Signed by:   D. Thomos Lemons DO on 12/24/2008   Method used:   Print then Give to Patient   RxID:   [id number] LYRICA 75 MG CAPS (PREGABALIN) one by mouth two times a day  #60 x 2   Entered and Authorized by:   D. Thomos Lemons DO   Signed by:   D. Thomos Lemons DO on 12/24/2008   Method used:   Print then Give to Patient   RxID:   [id number]   Current Allergies (reviewed today): No known allergies

## 2010-04-20 NOTE — Letter (Signed)
Summary: Cardiac Catheterization Instructions- [facility]  [facility]  [address]   [place], [place] [address]   Phone: [phone]  Fax:      07/27/2009 MRN: [id number]  [facility] [address] [place], [place]  [address]  Dear Mr. LIBERATORE,   You are scheduled for a Cardiac Catheterization on [facility] 08-17-09 with Dr. Sanjuana Kava  Please arrive to the 1st floor of the Heart and V[facility] Center at [facility] at   9:30 am  on the day of your procedure. Please do not arrive before 6:30 a.m. Call the Heart and Vascular Center at [phone] if you are unable to make your appointmnet. The Code to get into the parking garage under the building is 0100. Take the elevators to the 1st floor. You must have someone to drive you home. Someone must be with you for the first 24 hours after you arrive home. Please wear clothes that are easy to get on and off and wear slip-on shoes. Do not eat or drink after midnight except water with your medications that morning. Bring all your medications and current insurance cards with you.  ___ DO NOT take these medications before your procedure: ________________________________________________________________  ___ Make sure you take your aspirin.  ___ You may take ALL of your medications with water that morning. ________________________________________________________________________________________________________________________________  ___ DO NOT take ANY medications before your procedure.  ___ Pre-med instructions:  ________________________________________________________________________________________________________________________________  The usual length of stay after your procedure is 2 to 3 hours. This can vary.  If you have any questions, please call the office at the number listed above.   Deliah Goody, RN

## 2010-04-20 NOTE — Miscellaneous (Signed)
Summary: [facility] Cardiac Progress Note   [facility] Cardiac Progress Note   Imported By: Roderic Ovens 11/30/2009 12:07:48  _____________________________________________________________________  External Attachment:    Type:   Image     Comment:   External Document

## 2010-04-20 NOTE — Letter (Signed)
Summary: CMN for Diabetes Supplies & Shoes/[facility]  CMN for Diabetes Supplies & Shoes/[facility]   Imported By: Lanelle Bal 07/12/2009 11:05:56  _____________________________________________________________________  External Attachment:    Type:   Image     Comment:   External Document

## 2010-04-20 NOTE — Miscellaneous (Signed)
Summary: [facility] Note   [facility] Progress Note   Imported By: Roderic Ovens 09/27/2009 15:05:58  _____________________________________________________________________  External Attachment:    Type:   Image     Comment:   External Document

## 2010-04-20 NOTE — Letter (Signed)
Summary: Outpatient Coinsurance Notice  Outpatient Coinsurance Notice   Imported By: Marylou Mccoy 03/06/2010 18:44:14  _____________________________________________________________________  External Attachment:    Type:   Image     Comment:   External Document

## 2010-04-20 NOTE — Letter (Signed)
Summary: CPAP form/[facility]  CPAP form/ HealthCare   Imported By: Sherian Rein 02/01/2010 11:10:20  _____________________________________________________________________  External Attachment:    Type:   Image     Comment:   External Document

## 2010-04-20 NOTE — Letter (Signed)
Summary: [facility] - ER Instructions  [facility] - ER Instructions   Imported By: Marylou Mccoy 02/17/2010 11:12:35  _____________________________________________________________________  External Attachment:    Type:   Image     Comment:   External Document

## 2010-04-20 NOTE — Assessment & Plan Note (Signed)
Summary: 3 month follow up/mhf   Vital Signs:  Patient profile:   65 year old male Height:      75 inches Weight:      251.25 pounds BMI:     31.52 O2 Sat:      97 % on Room air Temp:     97.8 degrees F oral Pulse rate:   83 / minute Pulse rhythm:   regular Resp:     18 per minute BP sitting:   126 / 70  (left arm) Cuff size:   large  Vitals Entered By: Glendell Docker CMA (May 24, 2009 2:59 PM)  O2 Flow:  Room air CC: Rm 2- Follow up disease management Is Patient Diabetic? Yes   Primary Care Provider:  Dondra Spry DO  CC:  Rm 2- Follow up disease management.  History of Present Illness: 65 y/o white male with uncontrolled DM II, CAD, and post herpetic neuralgia for f/u low blood sugar 67 high 30  avg 140, seen by Dr Dione Booze last week and was advised that he will need blisters scraped off related to Shingles wife reports pts eating habits are not regular,  he is evening snacker  c/o increase in sob and low energy level-  it has been years since he was evaluated by cardiologist  Htn - stable     Allergies (verified): No Known Drug Allergies  Past History:  Past Medical History: Coronary artery disease GERD  Hyperlipidemia     Hypertension   Diabetes mellitus, type II Erectile dysfunction       Past Surgical History: Coronary artery bypass graft  Rotator cuff repair             Family History: Mother died of coronary artery disease at age 41 Father is alive at age 80 with coronary artery disease Half-brother who has a history of stroke            Social History: Never Smoked Alcohol use-yes   Married Occupation: truck Hospital doctor            Review of Systems       occ left heel pain  Physical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   Eyes:  mild redness of right lower eye lid Lungs:  normal respiratory effort and normal breath sounds.   Heart:  normal rate, regular rhythm, and no gallop.   Abdomen:  soft, non-tender, and  normal bowel sounds.   Msk:  left heel tenderness Extremities:  trace left pedal edema and trace right pedal edema.   Neurologic:  cranial nerves II-XII intact and gait normal.     Impression & Recommendations:  Problem # 1:  DIABETES MELLITUS, TYPE II, UNCONTROLLED (ICD-250.02) Pt still having occasional hypoglycemia.   reduce evening dose of lantus.  we discussed adjusting apidra dose based upon carb intake.  His updated medication list for this problem includes:    Metformin Hcl 1000 Mg Tabs (Metformin hcl) ..... One by mouth bid    Lantus Solostar 100 Unit/ml Soln (Insulin glargine) .Marland KitchenMarland KitchenMarland KitchenMarland Kitchen 30 units in am and 20 units in pm subcutaneously    Low-dose Aspirin 81 Mg Tabs (Aspirin) .[name]... Take 1 tablet by mouth once a day    Lisinopril 10 Mg Tabs (Lisinopril) ..... One by mouth qd    Apidra Solostar 100 Unit/ml Soln (Insulin glulisine) ..... Use 10 - 15 units subcutaneously   three times a day before meals  Problem # 2:  CORONARY ARTERY DISEASE (  ICD-414.00)  Pt having dyspnea with exertion.   He has seen cardiologist for f/u in yrs.  Arrange referral to Dr. Jens Som His updated medication list for this problem includes:    Low-dose Aspirin 81 Mg Tabs (Aspirin) .[name]... Take 1 tablet by mouth once a day    Lisinopril 10 Mg Tabs (Lisinopril) ..... One by mouth qd  Orders: Cardiology Referral (Cardiology)  Problem # 3:  POSTHERPETIC NEURALGIA (ICD-053.19) Assessment: Improved Severe facial pain is better but pt still struggling with night time symptoms.  use lidoderm patch at night. samples provided.  Problem # 4:  HYPERTENSION (ICD-401.9) well controlled.  Maintain current medication regimen.  His updated medication list for this problem includes:    Lisinopril 10 Mg Tabs (Lisinopril) ..... One by mouth qd  BP today: 126/70 Prior BP: 146/90 (02/24/2009)  Labs Reviewed: K+: 4.1 (05/17/2009) Creat: : 0.84 (05/17/2009)   Chol: 132 (08/24/2008)   HDL: 54.20 (08/24/2008)   LDL: 69  (08/24/2008)   TG: 46.0 (08/24/2008)  Problem # 5:  DYSPNEA ON EXERTION (ICD-786.09) Check 2D Echo  His updated medication list for this problem includes:    Lisinopril 10 Mg Tabs (Lisinopril) ..... One by mouth qd  Orders: Echo Referral (Echo)  Problem # 6:  HEEL PAIN, LEFT (ICD-729.5) Probable plantar fascitis.  we reviewed stretching exercises and wearing shoe inserts.  If no improvement, refer to ortho.  Complete Medication List: 1)  Lipitor 80 Mg Tabs (Atorvastatin calcium) .... Take 1 tablet by mouth once a day 2)  Metformin Hcl 1000 Mg Tabs (Metformin hcl) .... One by mouth bid 3)  Lantus Solostar 100 Unit/ml Soln (Insulin glargine) .... 30 units in am and 20 units in pm subcutaneously 4)  Garlique 400 Mg Tbec (Garlic) .[name].. 1 once daily 5)  Multivitamins Tabs (Multiple vitamin) .... One by mouth once daily 6)  Bd Ultra-fine Pen Needles 29g X 12.57mm Misc (Insulin pen needle) .... Use three times a day as directed 7)  Low-dose Aspirin 81 Mg Tabs (Aspirin) .... Take 1 tablet by mouth once a day 8)  Lisinopril 10 Mg Tabs (Lisinopril) .... One by mouth qd 9)  Apidra Solostar 100 Unit/ml Soln (Insulin glulisine) .... Use 10 - 15 units subcutaneously   three times a day before meals 10)  Accu-chek Aviva Strp (Glucose blood) .... For three times a day testing 11)  Fish Oil 1000 Mg Caps (Omega-3 fatty acids) .... Take 1 tablet by mouth once a day 12)  Omeprazole 20 Mg Cpdr (Omeprazole) .... One by mouth once daily 13)  Cialis 20 Mg Tabs (Tadalafil) .... One by mouth once daily as needed 14)  Tamsulosin Hcl 0.4 Mg Caps (Tamsulosin hcl) .... One by mouth qhs 15)  Valtrex 1 Gm Tabs (Valacyclovir hcl) .... Take 1 tablet by mouth once a day 16)  Lyrica 100 Mg Caps (Pregabalin) .... One by mouth three times a day 17)  Desipramine Hcl 25 Mg Tabs (Desipramine hcl) .... Two at bedtime 18)  Carbamazepine 200 Mg Tabs (Carbamazepine) .... Two two times a day 19)  Miralax Powd (Polyethylene glycol  3350) .Marland KitchenMarland KitchenMarland Kitchen 17 grams mixed with water two times a day 20)  Zostavax 16109 Unt/0.33ml Solr (Zoster vaccine live) .... Administer vaccine x 1 21)  Lidoderm 5 % Ptch (Lidocaine) .... Apply patch q12 hrs once daily at 9:00 pm (take off 9:00 am)  Patient Instructions: 1)  Please schedule a follow-up appointment in 2 months. Prescriptions: LIDODERM 5 % PTCH (LIDOCAINE) apply patch q12  hrs once daily at 9:00 PM (take off 9:00 AM)  #30 x 2   Entered and Authorized by:   D. Thomos Lemons DO   Signed by:   D. Thomos Lemons DO on 05/24/2009   Method used:   Print then Give to Patient   RxID:   [id number] ZOSTAVAX 32951 UNT/0.65ML SOLR (ZOSTER VACCINE LIVE) administer vaccine x 1  #1 x 0   Entered and Authorized by:   D. Thomos Lemons DO   Signed by:   D. Thomos Lemons DO on 05/24/2009   Method used:   Print then Give to Patient   RxID:   [id number] APIDRA SOLOSTAR 100 UNIT/ML SOLN (INSULIN GLULISINE) use 10 - 15 units subcutaneously   three times a day before meals  #1 month x 5   Entered and Authorized by:   D. Thomos Lemons DO   Signed by:   D. Thomos Lemons DO on 05/24/2009   Method used:   Electronically to        [facility] (retail)       [address]       [place], [place]  [address]       Ph: [phone]       Fax: [phone]   RxID:   [id number] LANTUS SOLOSTAR 100 UNIT/ML  SOLN (INSULIN GLARGINE) 30 units in AM and 20 units in PM subcutaneously  #1 month x 5   Entered and Authorized by:   D. Thomos Lemons DO   Signed by:   D. Thomos Lemons DO on 05/24/2009   Method used:   Electronically to        [facility] ([facility]       [address]       [place], [place]  [address]       Ph: [phone]       Fax: [phone]   RxID:   [id number]   Current Allergies (reviewed today): No known allergies

## 2010-04-20 NOTE — Progress Notes (Signed)
Summary: refill request  Phone Note Refill Request Message from:  Fax from Pharmacy on March 15, 2009 7:51 AM  Refills Requested: Medication #1:  LYRICA 100 MG CAPS one by mouth three times a day   Dosage confirmed as above?Dosage Confirmed   Brand Name Necessary? No   Supply Requested: 3 months  Medication #2:  BD ULTRA-FINE PEN NEEDLES 29G X 12.7MM  MISC use three times a day as directed   Dosage confirmed as above?Dosage Confirmed   Brand Name Necessary? No   Supply Requested: 3 months  Method Requested: Electronic Next Appointment Scheduled: 05-17-09 8:30 Lab  Initial call taken by: Roselle Locus,  March 15, 2009 7:52 AM  Follow-up for Phone Call        ok to refill x 3 Follow-up by: D. Thomos Lemons DO,  March 15, 2009 8:42 AM  Additional Follow-up for Phone Call Additional follow up Details #1::        Rx called to pharmacy Additional Follow-up by: Alfred Levins, CMA,  March 15, 2009 10:21 AM    Prescriptions: LYRICA 100 MG CAPS (PREGABALIN) one by mouth three times a day  #90 x 3   Entered by:   Alfred Levins, CMA   Authorized by:   D. Thomos Lemons DO   Signed by:   Alfred Levins, CMA on 03/15/2009   Method used:   Telephoned to ...       [facility] [address] (retail[facility]       [address]       [place], [place]  [address]       Ph: [phone]       Fax: [phone]   RxID:   [id number] BD ULTRA-FINE PEN NEEDLES 29G X 12.7MM  MISC (INSULIN PEN NEEDLE) use three times a day as directed  #100 x 3   Entered by:   Alfred Levins, CMA   Authorized by:   D. Thomos Lemons DO   Signed by:   Alfred Levins, CMA on 03/15/2009   Method used:   [facility] [address] [facility]       [address]       [place], [place]  [address]       Ph: [phone]       Fax: [phone]   RxID:   [id number]

## 2010-04-20 NOTE — Miscellaneous (Signed)
Summary: [facility] Cardiac Physician Order/Treatment Plan  [facility] Physician Order/Treatment Plan   Imported By: Roderic Ovens 09/06/2009 14:57:31  _____________________________________________________________________  External Attachment:    Type:   Image     Comment:   External Document

## 2010-04-20 NOTE — Cardiovascular Report (Signed)
Summary: Pre Cath Orders   Pre Cath Orders   Imported By: Roderic Ovens 09/06/2009 14:54:51  _____________________________________________________________________  External Attachment:    Type:   Image     Comment:   External Document

## 2010-04-20 NOTE — Progress Notes (Signed)
Summary: does not know whether to see you or [facility]   Phone Note Call from Patient Call back at Home Phone [phone]   Caller: patient wife Call For: Artist Pais  Summary of Call: He has been having some moderate arm pain.  He thinks maybe he hit a muscle when he put in his insulin.   He is having some mild pains in his chest.  He feels like he can get up and get around.  He is really depressed and cant get over the last episode and she does not know whether to book an appt with you or [facility] or go to the ED.   Initial call taken by: Roselle Locus,  March 23, 2010 9:42 AM  Follow-up for Phone Call        call returned to patient at [phone], patient states that the he is having pain in his arm, and toward his chest. He states its not throbbing, states that it is constant, and has been bothering him for the past 2 days. He denies other symptoms, he is not having shortness of breath, and not light headed or dizzy Follow-up by: Glendell Docker CMA,  March 23, 2010 9:55 AM  Additional Follow-up for Phone Call Additional follow up Details #1::        patient wife called back  states she rubbed biofreeze on his arm and chest and he feels better.  He wants to wait and see Roselle Locus  March 23, 2010 11:23 AM    Additional Follow-up for Phone Call Additional follow up Details #2::    I suggest pt go to ER re:  chest pain and arm pain Follow-up by: D. Thomos Lemons DO,  March 23, 2010 4:47 PM  Additional Follow-up for Phone Call Additional follow up Details #3:: Details for Additional Follow-up Action Taken: call was returned to patient he has been advised per Dr Artist Pais instructions. He states that he is whole lot better, and his symptoms, have resolved. Additional Follow-up by: Glendell Docker CMA,  March 23, 2010 4:50 PM

## 2010-04-20 NOTE — Progress Notes (Signed)
  Phone Note Outgoing Call   Summary of Call: call pt - x Coppedge of foot is negative. Initial call taken by: D. Thomos Lemons DO,  September 22, 2008 8:21 PM  Follow-up for Phone Call        called pt. and informed him that foot Xray was negative. Thank you, Michaelle Copas Follow-up by: Michaelle Copas,  September 24, 2008 9:15 AM

## 2010-04-20 NOTE — Assessment & Plan Note (Signed)
Summary: rov/fell at [facility] - er done ct scan / gd   Referring Provider:  pcp Primary Provider:  Dondra Spry DO   History of Present Illness: Primary Cardiologist:  Dr. Olga Millers  65 year old male with past medical history of CAD s/p CABG.  Prior abdominal ultrasound in 2003 showed no aneurysm.   A Myoview in March of 2011 revealed an EF of 47% and inferior ischemia.  An echo  in March of 2011 revealed normal LV function and mild left atrial enlargement.  Cath in June 2011 demonstrated 3/3 patent grafts with severe stenosis in the proximal and distal body of the SVG-PDA which was treated with PCI with a drug-eluting stent.  He continued to have dyspnea and is followed by Dr. Vassie Loll for sleep apnea.    He was in [place], [place] on 11/6.  He tripped and fell and broke rib 10 on the right.  He apparently had a chest CT and was told he needed to follow up here today for this.  We have not received any reports yet and I am having the patient sign a ROI form now to request the CT report.    The patient reports a h/o DOE for the last several mos.  He had an abnormal myoview as outlined above that prompted his PCI.  He felt much better after his PCI for a couple days.  However, since then, he has DOE with mild to mod activities.  He denies chest pain.  No arm or jaw pain.  No diaphoresis or nausea.  No syncope.  No orthopnea or PND.  No edema.  He has not noted any difference with his CPAP therapy. He does not feel like his symptoms are getting worse.    Current Medications (verified): 1)  Lipitor 80 Mg Tabs (Atorvastatin Calcium) .... Take One Tablet By Mouth Daily. 2)  Metformin Hcl 1000 Mg  Tabs (Metformin Hcl) .... One By Mouth Bid 3)  Lantus Solostar 100 Unit/ml  Soln (Insulin Glargine) .[name].. 15 Units in Am and 10 Units in Pm Subcutaneously 4)  Pen Needles 5/16" 31g X 8 Mm Misc (Insulin Pen Needle) .... Use As Directed 6 X Daily 5)  Aspirin Ec 325 Mg Tbec (Aspirin) .... Take One Tablet  By Mouth Daily 6)  Lisinopril 20 Mg Tabs (Lisinopril) .... One By Mouth Once Daily 7)  Apidra Solostar 100 Unit/ml Soln (Insulin Glulisine) .... Use 5 - 10 Units Subcutaneously   Three Times A Day Before Meals 8)  Accu-Chek Aviva  Strp (Glucose Blood) .... For Three Times A Day Testing 9)  Omeprazole 20 Mg Cpdr (Omeprazole) .... One By Mouth Once Daily 10)  Miralax  Powd (Polyethylene Glycol 3350) .Marland KitchenMarland KitchenMarland Kitchen 17 Grams Mixed With Water Two Times A Day 11)  Finasteride 5 Mg Tabs (Finasteride) .... One By Mouth Once Daily 12)  Effient 10 Mg Tabs (Prasugrel Hcl) .... Take 1 Tablet By Mouth Once A Day 13)  Bupropion Hcl 150 Mg Xr24h-Tab (Bupropion Hcl) .... One By Mouth Once Daily  Allergies (verified): No Known Drug Allergies  Past History:  Past Medical History: CORONARY ARTERY DISEASE (ICD-414.00) DIABETES MELLITUS, TYPE II, UNCONTROLLED (ICD-250.02) HYPERTENSION (ICD-401.9) HYPERLIPIDEMIA (ICD-272.4)   OSTEOARTHRITIS (ICD-715.90) POSTHERPETIC NEURALGIA (ICD-053.19)  HERPES ZOSTER (ICD-053.9) ADJUSTMENT DISORDER WITH MIXED FEATURES (ICD-309.28) DIABETIC PERIPHERAL NEUROPATHY (ICD-250.60) ERECTILE DYSFUNCTION (ICD-302.72) OBESITY (ICD-278.00) GERD (ICD-530.81)   Social History: Reviewed history from 12/06/2009 and no changes required. Never Smoked Alcohol use-yes     Married Occupation: truck  driver, retired             Review of Systems       As per  the HPI.  All other systems reviewed and negative.   Vital Signs:  Patient profile:   65 year old male Height:      74 inches Weight:      244.50 pounds BMI:     31.51 Pulse rate:   77 / minute BP sitting:   136 / 84  (right arm)  Vitals Entered By: Ollen Gross, RN, BSN (February 07, 2010 12:34 PM)  Physical Exam  General:  Well nourished, well developed, in no acute distress HEENT: normal Neck: no JVD Cardiac:  normal S1, S2; RRR; no murmur Lungs:  clear to auscultation bilaterally, no wheezing, rhonchi or rales Abd:  soft, nontender, no hepatomegaly Ext: no edema Vascular: no carotid  bruits Skin: warm and dry Neuro:  CNs 2-12 intact, no focal abnormalities noted    EKG  Procedure date:  02/07/2010  Findings:      Normal Sinus Rhythm Heart rate 77 Normal axis Q-wave in lead 3 Nonspecific ST-T wave changes No significant change since previous tracing  Impression & Recommendations:  Problem # 1:  DYSPNEA (ICD-786.05)  He continues to have dyspnea with exertion.  As noted, he noted significant improvement in his symptoms in the 2-3 days after his PCI.  However, since then he has continued to have DOE with mild to mod activities (e.g. yard work will cause significant dyspnea).  He has not noted any change in symptoms with treatment of his OSA.  He denies any significant lung disease or h/o smoking.    Orders: Nuclear Stress Test (Nuc Stress Test)  Problem # 2:  CORONARY ARTERY DISEASE (ICD-414.00)  3/3 grafts patent with PCI with DES to S-PDA in 08/2009. He remains on Effient and ASA. With continued symptoms of DOE in a diabetic, will set him up for a myoview to assess for ischemia.  He had an abnormal myoview before his stent.  So, if his myoview is negative for ischemia, this should be reassuring in r/o progression of CAD.  Orders: Nuclear Stress Test (Nuc Stress Test)  Problem # 3:  Abnormal Chest CT  Rec'd CD of CT and report. He has dense calcification of his abdominal aorta. I reviewed with Dr. Clifton James.   Images of his coronaries are nondiagnostic given the diffuse nature of his severe native vessel disease. There does not seem to be any aneurysmal dilation of his abdominal aorta. He denies any claudication symptoms. No further testing is required at this time.  Problem # 4:  SLEEP APNEA, OBSTRUCTIVE (ICD-327.23) Continue CPAP.  Problem # 5:  HYPERTENSION (ICD-401.9)  Borderline control  Problem # 6:  HYPERLIPIDEMIA (ICD-272.4) Followed by his PCP.  His updated  medication list for this problem includes:    Lipitor 80 Mg Tabs (Atorvastatin calcium) .[name]... Take one tablet by mouth daily.  Problem # 7:  DIABETES MELLITUS, TYPE II, UNCONTROLLED (ICD-250.02)  Last A1C under 8.  Patient Instructions: 1)  Your physician recommends that you schedule a follow-up appointment in: after stress myoviw with Dr. Jens Som. 2)  Your physician has requested that you have an exercise stress myoview.  For further information please visit [email or web address]  Please follow instruction sheet, as given. After Dec. 2nd/11

## 2010-04-20 NOTE — Progress Notes (Signed)
Summary: returning call  Phone Note Call from Patient Call back at Home Phone [phone]   Caller: Spouse Reason for Call: Talk to Nurse Summary of Call: returning call Initial call taken by: Migdalia Dk,  August 22, 2009 10:32 AM  Follow-up for Phone Call        Wife and patient are aware of lab results.  Mr. Mcgaugh has an appt with Dr. Artist Pais this Friday and will talk with him about lab work. Lisabeth Devoid RN

## 2010-04-20 NOTE — Progress Notes (Signed)
Summary: cardiac rehab  Phone Note Call from Patient Call back at [phone]   Caller: Patient Reason for Call: Talk to Nurse Summary of Call: needs referral for cardiac rehab, please fax [facility] [phone] or call @ [phone] Initial call taken by: Migdalia Dk,  August 22, 2009 1:39 PM  Follow-up for Phone Call        I spoke with Mr. Vroom and he would like to start Cardiac Rehab at the [facility] facility in [place], [place]. He called them and was told he needed a referral first.  Their fax # is [phone]. Phone # is [phone].  He would like to start rehab as soon as he can. I told him I would send this to Dr. Clifton James as Dr. Jens Som is out of the office this week. Lisabeth Devoid RN     Appended Document: cardiac rehab Pat, Can we help arrange this? thanks, chris  Appended Document: cardiac rehab I called and spoke with Rehab in [place] at above number. They do not provide Cardiac Rehab. I called and spoke with pt and wife and gave them this information. Pt would like to proceed with Cardiac Rehab at [facility]. Referral form completed and faxed to cardiac rehab. Pt aware cardiac rehab will call him about setting up appt times.

## 2010-04-20 NOTE — Progress Notes (Signed)
  Phone Note Outgoing Call   Reason for Call: Discuss lab or test results Summary of Call: cpap download 10/12- 01/28/10 >> excellent compliance , some residual events  (AHI 21/h, centrals 5/h ) on 10 cm, will increase to 12 cm & rechk download Initial call taken by: Comer Locket. Vassie Loll MD,  February 16, 2010 5:41 PM     Appended Document:  LMOMTCB X 1  Appended Document:  Pt spouse aware.

## 2010-04-20 NOTE — Assessment & Plan Note (Signed)
Summary: 3 month follow up/mhf   Vital Signs:  Patient profile:   65 year old male Height:      75 inches Weight:      249 pounds BMI:     31.24 O2 Sat:      96 % on Room air Temp:     97.6 degrees F oral Pulse rate:   90 / minute BP sitting:   140 / 80  (left arm) Cuff size:   large  Vitals Entered By: Payton Spark CMA (February 03, 2010 3:08 PM)  O2 Flow:  Room air CC: F/U. Discuss findings from UC last month and also cut lip while shaving 2 hours ago.    Primary Care Provider:  Dondra Spry DO  CC:  F/U. Discuss findings from UC last month and also cut lip while shaving 2 hours ago. [name]  History of Present Illness: 65 y/o white male for f/u int hx  fell while he was at [employer] missed step in dark parking lot landed on right ribs resulting in rib fracture xray findings noted incidental calcification of arteries - med records not avail for review  DM II- still having occ low blood sugar  mood swings - much better since starting wellbutrin wife has noticed signficant improvment   Current Medications (verified): 1)  Lipitor 80 Mg Tabs (Atorvastatin Calcium) .... Take One Tablet By Mouth Daily. 2)  Metformin Hcl 1000 Mg  Tabs (Metformin Hcl) .... One By Mouth Bid 3)  Lantus Solostar 100 Unit/ml  Soln (Insulin Glargine) .... 20 Units in Am and 10 Units in Pm Subcutaneously 4)  Garlique 400 Mg  Tbec (Garlic) .[name].. 1 Once Daily 5)  Bd Ultra-Fine Pen Needles 29g X 12.70mm  Misc (Insulin Pen Needle) .... Use Three Times A Day As Directed 6)  Aspirin Ec 325 Mg Tbec (Aspirin) .... Take One Tablet By Mouth Daily 7)  Lisinopril 20 Mg Tabs (Lisinopril) .... One By Mouth Once Daily 8)  Apidra Solostar 100 Unit/ml Soln (Insulin Glulisine) .... Use 10 - 15 Units Subcutaneously   Three Times A Day Before Meals 9)  Accu-Chek Aviva  Strp (Glucose Blood) .... For Three Times A Day Testing 10)  Omeprazole 20 Mg Cpdr (Omeprazole) .... One By Mouth Once Daily 11)  Miralax  Powd  (Polyethylene Glycol 3350) .Marland KitchenMarland KitchenMarland Kitchen 17 Grams Mixed With Water Two Times A Day 12)  Finasteride 5 Mg Tabs (Finasteride) .... One By Mouth Once Daily 13)  Effient 10 Mg Tabs (Prasugrel Hcl) .... Take 1 Tablet By Mouth Once A Day 14)  Daily Multi  Tabs (Multiple Vitamins-Minerals) .... Take 1 Tablet By Mouth Once A Day 15)  Bupropion Hcl 150 Mg Xr24h-Tab (Bupropion Hcl) .... One By Mouth Once Daily  Allergies (verified): No Known Drug Allergies  Past History:  Past Medical History: HYPERTENSION (ICD-401.9) HYPERLIPIDEMIA (ICD-272.4)  CORONARY ARTERY DISEASE (ICD-414.00)  OSTEOARTHRITIS (ICD-715.90) POSTHERPETIC NEURALGIA (ICD-053.19)  HERPES ZOSTER (ICD-053.9)  ADJUSTMENT DISORDER WITH MIXED FEATURES (ICD-309.28) DIABETIC PERIPHERAL NEUROPATHY (ICD-250.60) ERECTILE DYSFUNCTION (ICD-302.72) DIABETES MELLITUS, TYPE II, UNCONTROLLED (ICD-250.02) OBESITY (ICD-278.00) GERD (ICD-530.81)   Past Surgical History: Coronary artery bypass graft in 1996 with a LIMA to the LAD, saphenous vein graft to the acute marginal, saphenous vein graft to the PDA and a saphenous vein graft to the circumflex. Right knee arthroscopic surgery in March of 2006 s/p bilateral knee replacement   Shoulder surgery   Cardiac catheterization 5/11 revealed 60-70 LM and all native vessels occluded.cSaphenous vein graft to the  first obtuse marginal was patent with mild irregularities in the body of the saphenous vein graft. The saphenous vein graft to the left-sided PDA was patent.  There was a severe 95% stenosis in the proximal body of the saphenous vein graft.  There was a 90% stenosis in the distal body of the saphenous vein graft. The left internal mammary artery to the mid LAD was patent. The ejection fraction of 50%. Right heart cath revealed central aortic pressure 157/86, left ventricular pressure 150/16, left ventricular end-diastolic pressure 17, right atrial pressure 9, right ventricular pressure 29/7, right  ventricular end-diastolic pressure 11, pulmonary artery pressure 31/13 with a mean of 22, pulmonary capillary wedge pressure 13, central aortic saturation 96%, pulmonary artery saturation 65%, cardiac output 4.9 L per minute. Cardiac index 2 L per minute per meter squared.  Family History: Mother died of coronary artery disease at age 42 Father is alive at age 53 with coronary artery disease Half-brother who has a history of stroke                Social History: Never Smoked Alcohol use-yes     Married Occupation: truck Hospital doctor, retired              Physical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   Mouth:  small cut lower lip oozing blood Neck:  supple and no carotid bruits.   Lungs:  normal respiratory effort and normal breath sounds.   Heart:  normal rate, regular rhythm, and no gallop.   Extremities:  trace left pedal edema and trace right pedal edema.   Neurologic:  cranial nerves II-XII intact and gait normal.     Impression & Recommendations:  Problem # 1:  ADJUSTMENT DISORDER WITH MIXED FEATURES (ICD-309.28) Assessment Improved good reponse to wellbutrin no side effects noted continue 150 mg  Problem # 2:  DIABETES MELLITUS, TYPE II, UNCONTROLLED (ICD-250.02) pt advised to use lower lantus dose esp in AM if he plans on physical activity  His updated medication list for this problem includes:    Metformin Hcl 1000 Mg Tabs (Metformin hcl) ..... One by mouth bid    Lantus Solostar 100 Unit/ml Soln (Insulin glargine) .Marland KitchenMarland KitchenMarland KitchenMarland Kitchen 15 units in am and 10 units in pm subcutaneously    Aspirin Ec 325 Mg Tbec (Aspirin) .[name]... Take one tablet by mouth daily    Lisinopril 20 Mg Tabs (Lisinopril) ..... One by mouth once daily    Apidra Solostar 100 Unit/ml Soln (Insulin glulisine) ..... Use 5 - 10 units subcutaneously   three times a day before meals  Labs Reviewed: Creat: 0.82 (11/30/2009)     Last Eye Exam: normal (08/12/2009) Reviewed HgBA1c results: 7.1  (11/30/2009)  7.7 (05/17/2009)  Complete Medication List: 1)  Lipitor 80 Mg Tabs (Atorvastatin calcium) .... Take one tablet by mouth daily. 2)  Metformin Hcl 1000 Mg Tabs (Metformin hcl) .... One by mouth bid 3)  Lantus Solostar 100 Unit/ml Soln (Insulin glargine) .[name].. 15 units in am and 10 units in pm subcutaneously 4)  Pen Needles 5/16" 31g X 8 Mm Misc (Insulin pen needle) .... Use as directed 6 x daily 5)  Aspirin Ec 325 Mg Tbec (Aspirin) .... Take one tablet by mouth daily 6)  Lisinopril 20 Mg Tabs (Lisinopril) .... One by mouth once daily 7)  Apidra Solostar 100 Unit/ml Soln (Insulin glulisine) .... Use 5 - 10 units subcutaneously   three times a day before meals 8)  Accu-chek Aviva Strp (Glucose blood) .Marland KitchenMarland KitchenMarland Kitchen  For three times a day testing 9)  Omeprazole 20 Mg Cpdr (Omeprazole) .... One by mouth once daily 10)  Miralax Powd (Polyethylene glycol 3350) .Marland KitchenMarland KitchenMarland Kitchen 17 grams mixed with water two times a day 11)  Finasteride 5 Mg Tabs (Finasteride) .... One by mouth once daily 12)  Effient 10 Mg Tabs (Prasugrel hcl) .... Take 1 tablet by mouth once a day 13)  Bupropion Hcl 150 Mg Xr24h-tab (Bupropion hcl) .... One by mouth once daily  Patient Instructions: 1)  Please schedule a follow-up appointment in 2 months. 2)  BMP prior to visit, ICD-9:  401.9 3)  HbgA1C prior to visit, ICD-9:  250.02 4)  Please return for lab work one (1) week before your next appointment.  Prescriptions: BUPROPION HCL 150 MG XR24H-TAB (BUPROPION HCL) one by mouth once daily  #90 x 1   Entered and Authorized by:   D. Thomos Lemons DO   Signed by:   D. Thomos Lemons DO on 02/03/2010   Method used:   Print then Give to Patient   RxID:   [id number] PEN NEEDLES 5/16" 31G X 8 MM MISC (INSULIN PEN NEEDLE) use as directed 6 x daily  #200 x 5   Entered and Authorized by:   D. Thomos Lemons DO   Signed by:   D. Thomos Lemons DO on 02/03/2010   Method used:   Print then Give to Patient   RxID:   [id number] BUPROPION HCL 150 MG  XR24H-TAB (BUPROPION HCL) one by mouth once daily  #90 x 1   Entered and Authorized by:   D. Thomos Lemons DO   Signed by:   D. Thomos Lemons DO on 02/03/2010   Method used:   Electronically to        [facility] ([facility])       [address]       [place], [place]  [address]       Ph: [phone]       Fax: [phone]   RxID:   [id number] PEN NEEDLES 5/16" 31G X 8 MM MISC (INSULIN PEN NEEDLE) use as directed 6 x daily  #200 x 5   Entered and Authorized by:   D. Thomos Lemons DO   Signed by:   D. Thomos Lemons DO on 02/03/2010   Method used:   Electronically to        [facility]       [address]       [place], [place]  [address]       Ph: [phone]       Fax: [phone]   RxID:   [id number]    Orders Added: 1)  Est. Patient Level III [69485]

## 2010-04-20 NOTE — Progress Notes (Signed)
Summary: Omeprazole Refill  Phone Note Refill Request Message from:  Fax from Pharmacy on March 09, 2010 8:12 AM  Refills Requested: Medication #1:  OMEPRAZOLE 20 MG CPDR one by mouth once daily   Dosage confirmed as above?Dosage Confirmed   Brand Name Necessary? No   Supply Requested: 3 months   [facility] [address] [place] [address] fax [phone]   Method Requested: Electronic Next Appointment Scheduled: 03-09-10 11:30 Dr Artist Pais  Initial call taken by: Roselle Locus,  March 09, 2010 8:13 AM    Prescriptions: OMEPRAZOLE 20 MG CPDR (OMEPRAZOLE) one by mouth once daily  #90 x 0   Entered by:   Glendell Docker CMA   Authorized by:   D. Thomos Lemons DO   Signed by:   Glendell Docker CMA on 03/09/2010   Method used:   Electronically to        [facility] [address] [facility]       [address]       [place], [place]  [address]       Ph: [phone]       Fax: [phone]   RxID:   [id number]

## 2010-04-20 NOTE — Letter (Signed)
     February 17, 2007   [facility] [address] [place], [place] [address]  RE:  LAB RESULTS  Dear  [name],  The following is an interpretation of your most recent lab tests.  Please take note of any instructions provided or changes to medications that have resulted from your lab work.  ELECTROLYTES:  Good - no changes needed  KIDNEY FUNCTION TESTS:  Stable - no changes needed  LIVER FUNCTION TESTS:  Good - no changes needed  LIPID PANEL:  Stable - no changes needed Triglyceride: 54   Cholesterol: 169   LDL: 103   HDL: 54.9   Chol/HDL%:  3.1 CALC  THYROID STUDIES:  Thyroid studies normal TSH: 0.94     DIABETIC STUDIES:  Poor - schedule a follow-up appointment soon Blood Glucose: 259   HgbA1C: 10.0   Microalbumin/Creatinine Ratio: 22.5      Sincerely Yours,    Dr. Thomos Lemons

## 2010-04-20 NOTE — Assessment & Plan Note (Signed)
Summary: blood sugar up and down/mhf   Vital Signs:  Patient profile:   65 year old male Height:      74 inches Weight:      245 pounds BMI:     31.57 O2 Sat:      96 % on Room air Temp:     97.8 degrees F oral Pulse rate:   89 / minute Resp:     96 per minute BP sitting:   100 / 62  (right arm) Cuff size:   large  Vitals Entered By: Glendell Docker CMA (March 09, 2010 11:34 AM)  O2 Flow:  Room air CC: concerned about blood sugar Is Patient Diabetic? Yes Pain Assessment Patient in pain? no      CBG Result 255   Primary Care Provider:  Dondra Spry DO  CC:  concerned about blood sugar.  History of Present Illness:  65 y/o white male with hx of CAD, DM II for f/u int hx: "Had repeat cardiac cath in Dec of 2011 for dyspnea and f/u abnormal myoview. This revealed  and EF of 50. Continued patency internal mammary to the LAD. Occluded saphenous vein graft to the recent percutaneous intervention to the distal circumflex/PDA territory. Progressive disease in the saphenous vein graft to the OM. Patient evaluated by CVTS. Distal vessels felt to be poor targets. CABG could be considered in the future if PCI options were exhausted."  pt upset CAD got worse. he is concerned uncontrolled DM II contributed to worsening of CAD  DM II - frustrated with glucometer.  told current was defective.   can't rely on readings    Preventive Screening-Counseling & Management  Alcohol-Tobacco     Smoking Status: never  Allergies (verified): No Known Drug Allergies  Past History:  Past Medical History: CORONARY ARTERY DISEASE (ICD-414.00) DIABETES MELLITUS, TYPE II, UNCONTROLLED (ICD-250.02) HYPERTENSION (ICD-401.9) HYPERLIPIDEMIA (ICD-272.4)   OSTEOARTHRITIS (ICD-715.90) POSTHERPETIC NEURALGIA (ICD-053.19)  HERPES ZOSTER (ICD-053.9)  ADJUSTMENT DISORDER WITH MIXED FEATURES (ICD-309.28) DIABETIC PERIPHERAL NEUROPATHY (ICD-250.60) ERECTILE DYSFUNCTION (ICD-302.72) OBESITY  (ICD-278.00) GERD (ICD-530.81)   Review of Systems  The patient denies weight loss, weight gain, and chest pain.    Physical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   Head:  normocephalic and atraumatic.   Neck:  supple and no carotid bruits.   Lungs:  normal respiratory effort and normal breath sounds.   Heart:  normal rate, regular rhythm, and no gallop.   Extremities:  trace left pedal edema and trace right pedal edema.   Neurologic:  cranial nerves II-XII intact and gait normal.   Psych:  slightly anxious.     Impression & Recommendations:  Problem # 1:  DIABETES MELLITUS, TYPE II, UNCONTROLLED (ICD-250.02) Assessment Unchanged provided new glucometer. continue current insulin regimen avoid hypglycemia continue working on titrating short acting insulin dose based on carb intake  His updated medication list for this problem includes:    Metformin Hcl 1000 Mg Tabs (Metformin hcl) ..... One by mouth bid    Lantus Solostar 100 Unit/ml Soln (Insulin glargine) .Marland KitchenMarland KitchenMarland KitchenMarland Kitchen 15 units in am and 10 units in pm subcutaneously    Aspirin Ec 325 Mg Tbec (Aspirin) .[name]... Take one tablet by mouth daily    Lisinopril 10 Mg Tabs (Lisinopril) ..... One by mouth once daily    Apidra Solostar 100 Unit/ml Soln (Insulin glulisine) ..... Use 5 - 10 units subcutaneously   three times a day before meals  Labs Reviewed: Creat: 0.8 (03/01/2010)  Last Eye Exam: normal (08/12/2009) Reviewed HgBA1c results: 7.1 (11/30/2009)  7.7 (05/17/2009)  Problem # 2:  CORONARY ARTERY DISEASE (ICD-414.00) Assessment: Deteriorated pt has been reluctant to take B Blocker due to worsening of ED Trial of bystolic  The following medications were removed from the medication list:    Diltiazem Hcl Er Beads 180 Mg Xr24h-cap (Diltiazem hcl er beads) ..... One by mouth once daily His updated medication list for this problem includes:    Aspirin Ec 325 Mg Tbec (Aspirin) .[name]... Take one tablet by mouth  daily    Lisinopril 10 Mg Tabs (Lisinopril) ..... One by mouth once daily    Effient 10 Mg Tabs (Prasugrel hcl) .[name]... Take 1 tablet by mouth once a day    Bystolic 5 Mg Tabs (Nebivolol hcl) ..... One by mouth once daily  Problem # 3:  HYPERLIPIDEMIA (ICD-272.4) Assessment: Unchanged  His updated medication list for this problem includes:    Lipitor 80 Mg Tabs (Atorvastatin calcium) .[name]... Take one tablet by mouth daily.  Labs Reviewed: SGOT: 18 (11/30/2009)   SGPT: 17 (11/30/2009)   HDL:46 (11/30/2009), 54.20 (08/24/2008)  LDL:79 (11/30/2009), 69 (08/24/2008)  Chol:141 (11/30/2009), 132 (08/24/2008)  Trig:78 (11/30/2009), 46.0 (08/24/2008)  Complete Medication List: 1)  Lipitor 80 Mg Tabs (Atorvastatin calcium) .... Take one tablet by mouth daily. 2)  Metformin Hcl 1000 Mg Tabs (Metformin hcl) .... One by mouth bid 3)  Lantus Solostar 100 Unit/ml Soln (Insulin glargine) .[name].. 15 units in am and 10 units in pm subcutaneously 4)  Pen Needles 5/16" 31g X 8 Mm Misc (Insulin pen needle) .... Use as directed 6 x daily 5)  Aspirin Ec 325 Mg Tbec (Aspirin) .... Take one tablet by mouth daily 6)  Lisinopril 10 Mg Tabs (Lisinopril) .... One by mouth once daily 7)  Apidra Solostar 100 Unit/ml Soln (Insulin glulisine) .... Use 5 - 10 units subcutaneously   three times a day before meals 8)  Accu-chek Aviva Strp (Glucose blood) .... For three times a day testing 9)  Omeprazole 20 Mg Cpdr (Omeprazole) .... One by mouth once daily 10)  Miralax Powd (Polyethylene glycol 3350) .Marland KitchenMarland KitchenMarland Kitchen 17 grams mixed with water two times a day 11)  Effient 10 Mg Tabs (Prasugrel hcl) .... Take 1 tablet by mouth once a day 12)  Bupropion Hcl 150 Mg Xr24h-tab (Bupropion hcl) .... One by mouth once daily 13)  Accu-chek Aviva Strp (Glucose blood) .... Use up pt 5 x per day as directed 14)  Accu-chek Multiclix Lancets Misc (Lancets) .... Use up to 5 x daily as directed 15)  Bystolic 5 Mg Tabs (Nebivolol hcl) .... One by mouth once  daily  Patient Instructions: 1)  Please schedule a follow-up appointment in 1 month. Prescriptions: LISINOPRIL 10 MG TABS (LISINOPRIL) one by mouth once daily  #90 x 1   Entered and Authorized by:   D. Thomos Lemons DO   Signed by:   D. Thomos Lemons DO on 03/09/2010   Method used:   Print then Give to Patient   RxID:   [id number] BYSTOLIC 5 MG TABS (NEBIVOLOL HCL) one by mouth once daily  #90 x 1   Entered and Authorized by:   D. Thomos Lemons DO   Signed by:   D. Thomos Lemons DO on 03/09/2010   Method used:   Print then Give to Patient   RxID:   [id number] ACCU-CHEK MULTICLIX LANCETS  MISC (LANCETS) use up to 5 x daily as directed  #100 x 5  Entered and Authorized by:   D. Thomos Lemons DO   Signed by:   D. Thomos Lemons DO on 03/09/2010   Method used:   Print then Give to Patient   RxID:   [id number] ACCU-CHEK AVIVA  STRP (GLUCOSE BLOOD) use up pt 5 x per day as directed  #100 x 5   Entered and Authorized by:   D. Thomos Lemons DO   Signed by:   D. Thomos Lemons DO on 03/09/2010   Method used:   Print then Give to Patient   RxID:   [id number] DILTIAZEM HCL ER BEADS 180 MG XR24H-CAP (DILTIAZEM HCL ER BEADS) one by mouth once daily  #90 x 1   Entered and Authorized by:   D. Thomos Lemons DO   Signed by:   D. Thomos Lemons DO on 03/09/2010   Method used:   Print then Give to Patient   RxID:   [id number]    Orders Added: 1)  Est. Patient Level IV [01027]    Current Allergies (reviewed today): No known allergies   Laboratory Results   Blood Tests     CBG Random:: 255mg /dL

## 2010-04-20 NOTE — Progress Notes (Signed)
Summary: clinical review  Phone Note From Pharmacy   Summary of Call: [facility] message that [facility] would like to go over some clinical notes. [facility] to match with follow up appt, and to verify ifwhen pt is to rto, and to get a current dx code.. please give them a call back ref#[phone] call back # [phone] orignal rx written 02-24-08 #90 pt still has 1 refill left......................................Marland KitchenFelecia Deloach CMA  August 23, 2008 5:07 PM  Follow-up for Phone Call        refill is denied patient needs office visit Follow-up by: Glendell Docker CMA,  August 24, 2008 10:01 AM

## 2010-04-20 NOTE — Miscellaneous (Signed)
Summary: [facility]   [facility]   Imported By: Lanelle Bal 09/22/2009 12:25:12  _____________________________________________________________________  External Attachment:    Type:   Image     Comment:   External Document

## 2010-04-20 NOTE — Assessment & Plan Note (Signed)
Summary: Facial pain  right side/hea   Vital Signs:  Patient profile:   65 year old male Weight:      248 pounds BMI:     31.11 Temp:     98.3 degrees F oral Pulse rate:   20 / minute Pulse rhythm:   regular Resp:     18 per minute BP sitting:   130 / 80  (right arm) Cuff size:   large  Vitals Entered By: Glendell Docker CMA (December 21, 2008 11:52 AM)  Primary Care Provider:  Dondra Spry DO  CC:  Right Eye Pain & Poison oak, Red eye, and Rash.  History of Present Illness: Red Eye      This is a 65 year old man who presents with Red eye.  The patient reports redness of the right eye and pain, but denies blurring of vision, loss of vision, and foreign body sensation.  Sunday -  he was watching a ball game and got a real sharp pain in his right eye socket and since then he has a constant pain that has worsened since Sunday.   Rash      The patient also presents with Rash.  The patient reports macules and papules, but denies pustules and blisters.  The rash is located on the right arm and left arm.  The patient denies the following symptoms: fever, headache, facial swelling, and tongue swelling.  The patient reports a history of new topical exposure.  (he was clearing out his back yard with possible exposure to poison oak)  Allergies (verified): No Known Drug Allergies  Past History:  Past Medical History: Coronary artery disease GERD  Hyperlipidemia  Hypertension  Diabetes mellitus, type II Erectile dysfunction     Past Surgical History: Coronary artery bypass graft  Rotator cuff repair       Family History: Mother died of coronary artery disease at age 48 Father is alive at age 35 with coronary artery disease Half-brother who has a history of stroke       Social History: Never Smoked Alcohol use-yes Married Occupation: truck Administrator, [employer]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   Eyes:  right eye conjunctival injection,   pupils equal, pupils round, and pupils reactive to light.  normal visual acuity Lungs:  normal respiratory effort and normal breath sounds.   Heart:  normal rate, regular rhythm, and no gallop.   Skin:  scatterd maculopapular rash over both forearms   Impression & Recommendations:  Problem # 1:  CONJUNCTIVITIS (ICD-372.30) Right eye conjunctivitis.  Use eye drops as directed.  Red flag symptoms reviewed.  Arrange f/u with eye doctor.  He is also overdue for diabetic eye exam. Orders: Ophthalmology Referral (Ophthalmology)  Problem # 2:  CONTACT DERMATITIS (ICD-692.9) Bilateral forearm rash after cleaning out back yard.  Use topical steroids as directed.  Patient advised to call office if symptoms persist or worsen.  His updated medication list for this problem includes:    Triamcinolone Acetonide 0.5 % Crea (Triamcinolone acetonide) .[name]... Apply two times a day  Complete Medication List: 1)  Lipitor 80 Mg Tabs (Atorvastatin calcium) .... Take 1 tablet by mouth once a day 2)  Metformin Hcl 1000 Mg Tabs (Metformin hcl) .... One by mouth bid 3)  Lantus Solostar 100 Unit/ml Soln (Insulin glargine) .... 30 units in am,  20 units pm 4)  Garlique 400 Mg Tbec (Garlic) .[name].. 1 once daily  5)  Multivitamins Tabs (Multiple vitamin) .... One by mouth once daily 6)  Bd Ultra-fine Pen Needles 29g X 12.37mm Misc (Insulin pen needle) .... Use three times a day as directed 7)  Cinnamon 500 Mg Caps (Cinnamon) .... Take 1 tablet by mouth two times a day 8)  Low-dose Aspirin 81 Mg Tabs (Aspirin) .... Take 1 tablet by mouth once a day 9)  Lisinopril 10 Mg Tabs (Lisinopril) .... One by mouth qd 10)  Apidra Solostar 100 Unit/ml Soln (Insulin glulisine) .... Use 10-20 units before evening meal 11)  Accu-chek Aviva Strp (Glucose blood) .... For three times a day testing 12)  Fish Oil 1000 Mg Caps (Omega-3 fatty acids) .... Take 1 tablet by mouth once a day 13)  Omeprazole 20 Mg Cpdr (Omeprazole) .... One by mouth  once daily 14)  Gabapentin 300 Mg Caps (Gabapentin) .... One by mouth at bedtime prn 15)  Cialis 20 Mg Tabs (Tadalafil) .... One by mouth once daily as needed 16)  Tamsulosin Hcl 0.4 Mg Caps (Tamsulosin hcl) .... One by mouth qhs 17)  Neomycin-polymyxin-dexameth 5-10000-0.1 Susp (Neomycin-polymyxin-dexameth) .... 2 gtts to right eye 4 x daily 18)  Triamcinolone Acetonide 0.5 % Crea (Triamcinolone acetonide) .... Apply two times a day  Other Orders: Influenza Vaccine MCR (81191) Admin 1st Vaccine (47829)  Patient Instructions: 1)  Call our office if your symptoms do not  improve or gets worse. Prescriptions: TRIAMCINOLONE ACETONIDE 0.5 % CREA (TRIAMCINOLONE ACETONIDE) apply two times a day  #30 grams x 0   Entered and Authorized by:   D. Thomos Lemons DO   Signed by:   D. Thomos Lemons DO on 12/21/2008   Method used:   Print then Give to Patient   RxID:   [id number] NEOMYCIN-POLYMYXIN-DEXAMETH 5-10000-0.1 SUSP [facility]) 2 gtts to right eye 4 x daily  #1 week x 0   Entered and Authorized by:   D. Thomos Lemons DO   Signed by:   D. Thomos Lemons DO on 12/21/2008   Method used:   Print then Give to Patient   RxID:   [id number]   Current Allergies (reviewed today): No known allergies      Immunizations Administered:  Influenza Vaccine # 1:    Vaccine Type: Fluvax MCR    Site: left deltoid    Mfr: Gl[employer]oSmithKline    Dose: 0.5 ml    Route: IM    Given by: Glendell Docker CMA    Exp. Date: 09/15/2009    Lot #: [id number]    VIS given: 10/26/2008  Flu Vaccine Consent Questions:    Do you have a history of severe allergic reactions to this vaccine? no    Any prior history of allergic reactions to egg and/or gelatin? no    Do you have a sensitivity to the preservative Thimersol? no    Do you have a past history of Guillan-Barre Syndrome? no    Do you currently have an acute febrile illness? no    Have you ever had a severe reaction to latex? no    Vaccine  information given and explained to patient? yes

## 2010-04-20 NOTE — Progress Notes (Signed)
  Phone Note Outgoing Call   Summary of Call: call pt - I rec referral to sleep specialist .  Dr. Vassie Loll Initial call taken by: D. Thomos Lemons DO,  October 18, 2009 6:24 PM  Follow-up for Phone Call        Appt  Dr Vassie Loll   [facility]   August 18  Follow-up by: Darral Dash,  October 20, 2009 3:58 PM

## 2010-04-20 NOTE — Progress Notes (Signed)
Summary: Nuclear Pre-Procedure  Phone Note Outgoing Call   Call placed by: Milana Na, EMT-P,  June 13, 2009 3:23 PM Summary of Call: Reviewed information on Myoview Information Sheet (see scanned document for further details).  Spoke with patient.     Nuclear Med Background Indications for Stress Test: Evaluation for Ischemia, Graft Patency   History: CABG, Heart Catheterization, Myocardial Perfusion Study  History Comments: '96 CABG x3 03/06 MPS mild apical ischemia EF 51% 12/06 Heart Cath EF 50% mild vein graft DZ Med [place]  Symptoms: DOE, Fatigue, SOB    Nuclear Pre-Procedure Cardiac Risk Factors: Family History - CAD, Hypertension, IDDM Type 2, Lipids Height (in): 75  Nuclear Med Study Referring MD:  B.Crenshaw

## 2010-04-20 NOTE — Letter (Signed)
Summary: [facility]   Imported By: Debby Freiberg 09/14/2009 13:44:37  _____________________________________________________________________  External Attachment:    Type:   Image     Comment:   External Document

## 2010-04-20 NOTE — Miscellaneous (Signed)
  Clinical Lists Changes  Observations: Added new observation of NUCLEAR NOS: Final Intrepretation Probable abnormal adenosine Myoview with no diagnostic ekecttocardiograpphic changes. The scintigraphic results show apical thinning. There is a question of trivial ischemia at the apex. The remaning vascular territories are perfused normally. The gated ejection fraction was 51% and the wall motion was normal. Overall this was felt to represent a low-risk study (06/06/2004 10:04) Added new observation of ABDOM US: Impression No evidence of abdominal aneurysm segment of proximal aorta obscured by bowel gas (03/17/2002 10:12) Added new observation of NUCLEAR NOS: Final Impression Persantine Cardiolite study thinning of the inferolateral  wall and very faint redistribution in the lateral aprx. Overall this would be considered low risk study prior to orthopaedic surgery. Redistribution in the apex is very small and seen mostly in horizontal views. Overall ejection fracetion is mildly decreased in the 50% range with apical hypokinessis (04/08/2000 10:08) Added new observation of LEA DUPLEX: Findings No plaque visulaized bilaterally No ICA stenosis bilaterally Veterbral artery flow antegrade bilaterally (12/19/1999 10:10)      Nuclear Study  Procedure date:  06/06/2004  Findings:      Final Intrepretation Probable abnormal adenosine Myoview with no diagnostic ekecttocardiograpphic changes. The scintigraphic results show apical thinning. There is a question of trivial ischemia at the apex. The remaning vascular territories are perfused normally. The gated ejection fraction was 51% and the wall motion was normal. Overall this was felt to represent a low-risk study  Nuclear Study  Procedure date:  04/08/2000  Findings:      Final Impression Persantine Cardiolite study thinning of the inferolateral  wall and very faint redistribution in the lateral aprx. Overall this would be considered low risk  study prior to orthopaedic surgery. Redistribution in the apex is very small and seen mostly in horizontal views. Overall ejection fracetion is mildly decreased in the 50% range with apical hypokinessis  Arterial Doppler  Procedure date:  12/19/1999  Findings:      Findings No plaque visulaized bilaterally No ICA stenosis bilaterally Veterbral artery flow antegrade bilaterally  US of Abdomen  Procedure date:  03/17/2002  Findings:      Impression No evidence of abdominal aneurysm segment of proximal aorta obscured by bowel gas

## 2010-04-20 NOTE — Progress Notes (Signed)
Summary: Miralax Refill  Phone Note Refill Request Message from:  Fax from Pharmacy on March 22, 2010 9:06 AM  Refills Requested: Medication #1:  MIRALAX  POWD 17 grams mixed with water two times a day   Dosage confirmed as above?Dosage Confirmed   Brand Name Necessary? No   Supply Requested: 1 month   [facility] [address] [place] [address] fax [phone]   Method Requested: Electronic Next Appointment Scheduled: 03-27-10 [facility]  Initial call taken by: Roselle Locus,  March 22, 2010 9:19 AM    Prescriptions: MIRALAX  POWD (POLYETHYLENE GLYCOL 3350) 17 grams mixed with water two times a day  #1 month x 3   Entered by:   Glendell Docker CMA   Authorized by:   D. Thomos Lemons DO   Signed by:   Glendell Docker CMA on 03/22/2010   Method used:   Electronically to        [facility] [address] (retail)       [address]       [place], [place]  [address]       Ph: [phone]       Fax: [phone]   RxID:   [id number]

## 2010-04-20 NOTE — Consult Note (Signed)
Summary: [facility]  [facility]   Imported By: Lanelle Bal 02/21/2009 12:00:46  _____________________________________________________________________  External Attachment:    Type:   Image     Comment:   External Document

## 2010-04-20 NOTE — Miscellaneous (Signed)
Summary: [facility] Physician Order/Treatment Plan   [facility] Physician Order/Treatment Plan   Imported By: Roderic Ovens 03/30/2010 16:16:12  _____________________________________________________________________  External Attachment:    Type:   Image     Comment:   External Document

## 2010-04-20 NOTE — Assessment & Plan Note (Signed)
Summary: f/u   Vital Signs:  Patient Profile:   65 Years Old Male Height:     75 inches Weight:      247.50 pounds BMI:     31.05 Temp:     98.1 degrees F oral Pulse rate:   88 / minute Pulse rhythm:   regular Resp:     18 per minute BP sitting:   128 / 80  (right arm) Cuff size:   large  Vitals Entered By: Glendell Docker CMA (February 24, 2008 10:39 AM)               Vision Comments: 11/2008   PCP:  Dondra Spry DO  Chief Complaint:  Follow up disease management and Type 2 diabetes mellitus follow-up.  History of Present Illness: Follow up disease management  Type 2 Diabetes Mellitus Follow-Up      This is a 65 year old man who presents for Type 2 diabetes mellitus follow-up.  The patient denies self managed hypoglycemia and hypoglycemia requiring help.  The patient denies the following symptoms: chest pain.  Since the last visit the patient reports poor dietary compliance and noncompliance with medications.  Since the last visit, the patient reports having had eye care by an ophthalmologist.  Complications from diabetes include peripheral neuropathy.  He complains of aching sensation in his feet especially at night.  Htn - stable Hyperlipidemia - stable    Current Allergies (reviewed today): No known allergies   Past Medical History:    Coronary artery disease    GERD    Hyperlipidemia    Hypertension    Diabetes mellitus, type II    Erectile dysfunction     Social History:    Never Smoked    Alcohol use-yes    Married    Occupation: truck Hospital doctor          Review of Systems      See HPI   Physical Exam  General:     alert, well-developed, and well-nourished.   Neck:     supple and no carotid bruits.   Lungs:     normal respiratory effort and normal breath sounds.   Heart:     normal rate, regular rhythm, and no gallop.   Abdomen:     soft and non-tender.   Pulses:     dorsalis pedis and posterior tibial pulses are full and equal  bilaterally Extremities:     No lower extremity edema   Diabetes Management Exam:    Foot Exam (with socks and/or shoes not present):       Sensory-Monofilament:          Left foot: diminished          Right foot: diminished       Inspection:          Left foot: normal          Right foot: normal       Nails:          Left foot: normal          Right foot: normal    Eye Exam:       Eye Exam done elsewhere          Date: 11/19/2007          Results: Background diabetic retinopathy          Done by: Dr. Dione Booze    Impression & Recommendations:  Problem # 1:  DIABETES MELLITUS, TYPE  II, UNCONTROLLED (ICD-250.02) No significant change is A1c.   He has not used novolog on a regular basis.  I urged compliance.   He will likely need mealtime insulin with all meals.  His updated medication list for this problem includes:    Metformin Hcl 1000 Mg Tabs (Metformin hcl) ..... One by mouth bid    Lantus Solostar 100 Unit/ml Soln (Insulin glargine) .Marland KitchenMarland KitchenMarland KitchenMarland Kitchen 30 u two times a day    Low-dose Aspirin 81 Mg Tabs (Aspirin) .[name]... Take 1 tablet by mouth once a day    Lisinopril 10 Mg Tabs (Lisinopril) ..... One by mouth qd    Novolog Flexpen 100 Unit/ml Soln (Insulin aspart) .Marland KitchenMarland KitchenMarland KitchenMarland Kitchen 10-15 units before dinner  Labs Reviewed: HgBA1c: 8.7 (02/11/2008)   Creat: 1.0 (11/18/2007)   Microalbumin: 0.8 (11/18/2007)   Problem # 2:  HYPERTENSION (ICD-401.9) BP stable.  Maintain current medication regimen.  His updated medication list for this problem includes:    Lisinopril 10 Mg Tabs (Lisinopril) ..... One by mouth qd  BP today: 128/80 Prior BP: 141/87 (11/18/2007)  Labs Reviewed: Creat: 1.0 (11/18/2007) Chol: 158 (11/18/2007)   HDL: 51.5 (11/18/2007)   LDL: 92 (11/18/2007)   TG: 75 (11/18/2007)   Problem # 3:  HYPERLIPIDEMIA (ICD-272.4) Stable.   Pt tolerating lipitor.  His updated medication list for this problem includes:    Lipitor 80 Mg Tabs (Atorvastatin calcium) .[name]... Take 1 tablet by  mouth once a day  Labs Reviewed: Chol: 158 (11/18/2007)   HDL: 51.5 (11/18/2007)   LDL: 92 (11/18/2007)   TG: 75 (11/18/2007) SGOT: 22 (11/18/2007)   SGPT: 28 (11/18/2007)   Problem # 4:  DIABETIC PERIPHERAL NEUROPATHY (ICD-250.60) Pt with aching sensation in his feet.  Trial of gabapentin at bedtime.  His updated medication list for this problem includes:    Metformin Hcl 1000 Mg Tabs (Metformin hcl) ..... One by mouth bid    Lantus Solostar 100 Unit/ml Soln (Insulin glargine) .Marland KitchenMarland KitchenMarland KitchenMarland Kitchen 30 u two times a day    Low-dose Aspirin 81 Mg Tabs (Aspirin) .[name]... Take 1 tablet by mouth once a day    Lisinopril 10 Mg Tabs (Lisinopril) ..... One by mouth qd    Novolog Flexpen 100 Unit/ml Soln (Insulin aspart) .Marland KitchenMarland KitchenMarland KitchenMarland Kitchen 10-15 units before dinner   Complete Medication List: 1)  Lipitor 80 Mg Tabs (Atorvastatin calcium) .... Take 1 tablet by mouth once a day 2)  Metformin Hcl 1000 Mg Tabs (Metformin hcl) .... One by mouth bid 3)  Lantus Solostar 100 Unit/ml Soln (Insulin glargine) .... 30 u two times a day 4)  Garlique 400 Mg Tbec (Garlic) .[name].. 1 once daily 5)  Multivitamins Tabs (Multiple vitamin) .... One by mouth once daily 6)  Bd Ultra-fine Pen Needles 29g X 12.66mm Misc (Insulin pen needle) .... Test blood sugar three times a day 7)  Cinnamon 500 Mg Caps (Cinnamon) .... Take 1 tablet by mouth two times a day 8)  Low-dose Aspirin 81 Mg Tabs (Aspirin) .... Take 1 tablet by mouth once a day 9)  Lisinopril 10 Mg Tabs (Lisinopril) .... One by mouth qd 10)  Novolog Flexpen 100 Unit/ml Soln (Insulin aspart) .[name].. 10-15 units before dinner 11)  Accu-chek Aviva Strp (Glucose blood) .... For three times a day testing 12)  Benefiber Tabs (Wheat dextrin) .... Take 1 tablet by mouth once a day 13)  Fish Oil 1000 Mg Caps (Omega-3 fatty acids) .... Take 1 tablet by mouth once a day 14)  Omeprazole 20 Mg Cpdr (Omeprazole) .... One  by mouth once daily 15)  Gabapentin 300 Mg Caps (Gabapentin) .... One by mouth at  bedtime prn 16)  Cialis 20 Mg Tabs (Tadalafil) .... One by mouth once daily as needed   Patient Instructions: 1)  Please schedule a follow-up appointment in 3 months. 2)  BMP prior to visit, ICD-9:  401.9 3)  AST, ALT  prior to visit, ICD-9:  272.4 4)  Lipid Panel prior to visit, ICD-9: 272.4 5)  HbgA1C prior to visit, ICD-9:  250.02 6)  Urine Microalbumin prior to visit, ICD-9: 250.02   Prescriptions: METFORMIN HCL 1000 MG  TABS (METFORMIN HCL) one by mouth bid  #180 x 3   Entered and Authorized by:   D. Thomos Lemons DO   Signed by:   D. Thomos Lemons DO on 02/24/2008   Method used:   Print then Give to Patient   RxID:   [id number] LISINOPRIL 10 MG  TABS (LISINOPRIL) one by mouth qd  #90 x 3   Entered and Authorized by:   D. Thomos Lemons DO   Signed by:   D. Thomos Lemons DO on 02/24/2008   Method used:   Print then Give to Patient   RxID:   [id number] LIPITOR 80 MG  TABS (ATORVASTATIN CALCIUM) Take 1 tablet by mouth once a day  #90 x 3   Entered and Authorized by:   D. Thomos Lemons DO   Signed by:   D. Thomos Lemons DO on 02/24/2008   Method used:   Print then Give to Patient   RxID:   [id number] NOVOLOG FLEXPEN 100 UNIT/ML  SOLN (INSULIN ASPART) 10-15 units before dinner  #1 month x 3   Entered and Authorized by:   D. Thomos Lemons DO   Signed by:   D. Thomos Lemons DO on 02/24/2008   Method used:   Print then Give to Patient   RxID:   [id number] LANTUS SOLOSTAR 100 UNIT/ML  SOLN (INSULIN GLARGINE) 30 U two times a day  #1 month x 3   Entered and Authorized by:   D. Thomos Lemons DO   Signed by:   D. Thomos Lemons DO on 02/24/2008   Method used:   Print then Give to Patient   RxID:   [id number] CIALIS 20 MG TABS (TADALAFIL) one by mouth once daily as needed  #10 x 3   Entered and Authorized by:   D. Thomos Lemons DO   Signed by:   D. Thomos Lemons DO on 02/24/2008   Method used:   Print then Give to Patient   RxID:   [id number] ACCU-CHEK AVIVA  STRP (GLUCOSE BLOOD)  for three times a day testing  #100 x 3   Entered and Authorized by:   D. Thomos Lemons DO   Signed by:   D. Thomos Lemons DO on 02/24/2008   Method used:   Print then Give to Patient   RxID:   [id number] OMEPRAZOLE 20 MG CPDR (OMEPRAZOLE) one by mouth once daily  #90 x 3   Entered and Authorized by:   D. Thomos Lemons DO   Signed by:   D. Thomos Lemons DO on 02/24/2008   Method used:   Print then Give to Patient   RxID:   [id number] GABAPENTIN 300 MG CAPS (GABAPENTIN) one by mouth at bedtime prn  #30 x 3   Entered and Authorized by:   D. Thomos Lemons DO   Signed by:   D. Thomos Lemons DO on 02/24/2008   Method  used:   Print then Give to Patient   RxID:   [id number]  ] Current Allergies (reviewed today): No known allergies

## 2010-04-20 NOTE — Letter (Signed)
Summary: [facility] - CT ABDPEL IV CONT ONLY TR  [facility] - CT ABDPEL IV CONT ONLY TR   Imported By: Marylou Mccoy 02/17/2010 10:33:04  _____________________________________________________________________  External Attachment:    Type:   Image     Comment:   External Document

## 2010-04-20 NOTE — Assessment & Plan Note (Signed)
Summary: FU VISIT/DT   Vital Signs:  Patient profile:   65 year old male Height:      75 inches Weight:      247 pounds O2 Sat:      96 % on Room air Pulse rate:   92 / minute BP sitting:   128 / 82  (left arm) Cuff size:   regular  Vitals Entered By: Kathlene November (December 06, 2009 11:35 AM)  O2 Flow:  Room air CC: check-up. Has rash on left leg- itches, also wants his right great toe looked at    Primary Care Provider:  D. Thomos Lemons DO  CC:  check-up. Has rash on left leg- itches and also wants his right great toe looked at.  History of Present Illness: 65 y/o white male with hx of CAD, DM II uncontrolled, ED, and BPH for f/u using CPAP - wife has not noticed significant difference getting dry mouth  DM II - insulin use sporadic,  pt not checking blood sugar regularly some hypoglycemia - 3-4 x per week wife concerned he has mood swings. he can't focus.  wife thinks he has ADD  BPH - urince stream is good but notes abnormal ejaculation  Htn - stable    Current Medications (verified): 1)  Lipitor 80 Mg Tabs (Atorvastatin Calcium) .... Take One Tablet By Mouth Daily. 2)  Metformin Hcl 1000 Mg  Tabs (Metformin Hcl) .... One By Mouth Bid 3)  Lantus Solostar 100 Unit/ml  Soln (Insulin Glargine) .... 30 Units in Am and 20 Units in Pm Subcutaneously 4)  Garlique 400 Mg  Tbec (Garlic) .[name].. 1 Once Daily 5)  Bd Ultra-Fine Pen Needles 29g X 12.34mm  Misc (Insulin Pen Needle) .... Use Three Times A Day As Directed 6)  Aspirin Ec 325 Mg Tbec (Aspirin) .... Take One Tablet By Mouth Daily 7)  Lisinopril 20 Mg Tabs (Lisinopril) .... One By Mouth Once Daily 8)  Apidra Solostar 100 Unit/ml Soln (Insulin Glulisine) .... Use 10 - 15 Units Subcutaneously   Three Times A Day Before Meals 9)  Accu-Chek Aviva  Strp (Glucose Blood) .... For Three Times A Day Testing 10)  Omeprazole 20 Mg Cpdr (Omeprazole) .... One By Mouth Once Daily 11)  Tamsulosin Hcl 0.4 Mg Caps (Tamsulosin Hcl) ....  One By Mouth Qhs 12)  Miralax  Powd (Polyethylene Glycol 3350) .Marland KitchenMarland KitchenMarland Kitchen 17 Grams Mixed With Water Two Times A Day 13)  Finasteride 5 Mg Tabs (Finasteride) .... One By Mouth Once Daily 14)  Effient 10 Mg Tabs (Prasugrel Hcl) .... Take 1 Tablet By Mouth Once A Day 15)  Daily Multi  Tabs (Multiple Vitamins-Minerals) .... Take 1 Tablet By Mouth Once A Day  Allergies (verified): No Known Drug Allergies  Comments:  Nurse/Medical Assistant: The patient's medications and allergies were reviewed with the patient and were updated in the Medication and Allergy Lists. Kathlene November (December 06, 2009 11:36 AM)  Past History:  Past Medical History: HYPERTENSION (ICD-401.9) HYPERLIPIDEMIA (ICD-272.4)  CORONARY ARTERY DISEASE (ICD-414.00)  OSTEOARTHRITIS (ICD-715.90) POSTHERPETIC NEURALGIA (ICD-053.19)  HERPES ZOSTER (ICD-053.9) ADJUSTMENT DISORDER WITH MIXED FEATURES (ICD-309.28) DIABETIC PERIPHERAL NEUROPATHY (ICD-250.60) ERECTILE DYSFUNCTION (ICD-302.72) DIABETES MELLITUS, TYPE II, UNCONTROLLED (ICD-250.02) OBESITY (ICD-278.00) GERD (ICD-530.81)   Past Surgical History: Coronary artery bypass graft in 1996 with a LIMA to the LAD, saphenous vein graft to the acute marginal, saphenous vein graft to the PDA and a saphenous vein graft to the circumflex. Right knee arthroscopic surgery in March of 2006 s/p bilateral knee  replacement   Shoulder surgery  Cardiac catheterization 5/11 revealed 60-70 LM and all native vessels occluded.cSaphenous vein graft to the first obtuse marginal was patent with mild irregularities in the body of the saphenous vein graft. The saphenous vein graft to the left-sided PDA was patent.  There was a severe 95% stenosis in the proximal body of the saphenous vein graft.  There was a 90% stenosis in the distal body of the saphenous vein graft. The left internal mammary artery to the mid LAD was patent. The ejection fraction of 50%. Right heart cath revealed central aortic  pressure 157/86, left ventricular pressure 150/16, left ventricular end-diastolic pressure 17, right atrial pressure 9, right ventricular pressure 29/7, right ventricular end-diastolic pressure 11, pulmonary artery pressure 31/13 with a mean of 22, pulmonary capillary wedge pressure 13, central aortic saturation 96%, pulmonary artery saturation 65%, cardiac output 4.9 L per minute. Cardiac index 2 L per minute per meter squared. SH/Risk Factors reviewed for relevance  Family History: Mother died of coronary artery disease at age 76 Father is alive at age 77 with coronary artery disease Half-brother who has a history of stroke               Social History: Never Smoked Alcohol use-yes     Married Occupation: truck Hospital doctor, retired             Review of Systems  The patient denies chest pain and syncope.         abnormal ejaculation  Physical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   Head:  normocephalic and atraumatic.   Neck:  supple and no carotid bruits.   Lungs:  normal respiratory effort and normal breath sounds.   Heart:  normal rate, regular rhythm, and no gallop.   Abdomen:  soft, non-tender, and normal bowel sounds.   Extremities:  trace left pedal edema and trace right pedal edema.   Neurologic:  cranial nerves II-XII intact and gait normal.   Psych:  easily distracted   Impression & Recommendations:  Problem # 1:  BENIGN PROSTATIC HYPERTROPHY, WITH OBSTRUCTION (ICD-600.01) Assessment Unchanged pt c/o abnormal ejaculation.  dc tamulosin  The following medications were removed from the medication list:    Tamsulosin Hcl 0.4 Mg Caps (Tamsulosin hcl) ..... One by mouth qhs His updated medication list for this problem includes:    Finasteride 5 Mg Tabs (Finasteride) ..... One by mouth once daily  Problem # 2:  DIABETES MELLITUS, TYPE II, UNCONTROLLED (ICD-250.02) Pt having occ low blood sugars.  pt eating habits and insulin use erratic reduce  dose of lantus  His updated medication list for this problem includes:    Metformin Hcl 1000 Mg Tabs (Metformin hcl) ..... One by mouth bid    Lantus Solostar 100 Unit/ml Soln (Insulin glargine) .[name]... 20 units in am and 10 units in pm subcutaneously    Aspirin Ec 325 Mg Tbec (Aspirin) .[name]... Take one tablet by mouth daily    Lisinopril 20 Mg Tabs (Lisinopril) ..... One by mouth once daily    Apidra Solostar 100 Unit/ml Soln (Insulin glulisine) ..... Use 10 - 15 units subcutaneously   three times a day before meals  Labs Reviewed: Creat: 0.82 (11/30/2009)     Last Eye Exam: normal (08/12/2009) Reviewed HgBA1c results: 7.1 (11/30/2009)  7.7 (05/17/2009)  Problem # 3:  ADJUSTMENT DISORDER WITH MIXED FEATURES (ICD-309.28) pt with poor concentration.  moods can be somewhat erratic.  wife also concerned he may have ADD.  Trial of wellbutrin.   too risky to try stimulant with CAD  Problem # 4:  HYPERTENSION (ICD-401.9) Assessment: Unchanged  His updated medication list for this problem includes:    Lisinopril 20 Mg Tabs (Lisinopril) ..... One by mouth once daily  BP today: 128/82 Prior BP: 122/82 (11/01/2009)  Labs Reviewed: K+: 4.2 (11/30/2009) Creat: : 0.82 (11/30/2009)   Chol: 141 (11/30/2009)   HDL: 46 (11/30/2009)   LDL: 79 (11/30/2009)   TG: 78 (11/30/2009)  Complete Medication List: 1)  Lipitor 80 Mg Tabs (Atorvastatin calcium) .... Take one tablet by mouth daily. 2)  Metformin Hcl 1000 Mg Tabs (Metformin hcl) .... One by mouth bid 3)  Lantus Solostar 100 Unit/ml Soln (Insulin glargine) .... 20 units in am and 10 units in pm subcutaneously 4)  Garlique 400 Mg Tbec (Garlic) .[name].. 1 once daily 5)  Bd Ultra-fine Pen Needles 29g X 12.69mm Misc (Insulin pen needle) .... Use three times a day as directed 6)  Aspirin Ec 325 Mg Tbec (Aspirin) .... Take one tablet by mouth daily 7)  Lisinopril 20 Mg Tabs (Lisinopril) .... One by mouth once daily 8)  Apidra Solostar 100 Unit/ml Soln  (Insulin glulisine) .... Use 10 - 15 units subcutaneously   three times a day before meals 9)  Accu-chek Aviva Strp (Glucose blood) .... For three times a day testing 10)  Omeprazole 20 Mg Cpdr (Omeprazole) .... One by mouth once daily 11)  Miralax Powd (Polyethylene glycol 3350) .Marland KitchenMarland KitchenMarland Kitchen 17 grams mixed with water two times a day 12)  Finasteride 5 Mg Tabs (Finasteride) .... One by mouth once daily 13)  Effient 10 Mg Tabs (Prasugrel hcl) .... Take 1 tablet by mouth once a day 14)  Daily Multi Tabs (Multiple vitamins-minerals) .... Take 1 tablet by mouth once a day 15)  Bupropion Hcl 150 Mg Xr24h-tab (Bupropion hcl) .... One by mouth once daily  Other Orders: Flu Vaccine 33yrs + MEDICARE PATIENTS (Z6109) Administration Flu vaccine - MCR (U0454)  Patient Instructions: 1)  Please schedule a follow-up appointment in 2 months. 2)  Stop tamsulosin Prescriptions: BUPROPION HCL 150 MG XR24H-TAB (BUPROPION HCL) one by mouth once daily  #30 x 3   Entered and Authorized by:   D. Thomos Lemons DO   Signed by:   D. Thomos Lemons DO on 12/06/2009   Method used:   Electronically to        [facility] (retail)       [address]       [place], [place]  [address]       Ph: [phone]       Fax: [phone]   RxID:   [id number] LANTUS SOLOSTAR 100 UNIT/ML  SOLN (INSULIN GLARGINE) 20 units in AM and 10 units in PM subcutaneously  #1 month x 3   Entered and Authorized by:   D. Thomos Lemons DO   Signed by:   D. Thomos Lemons DO on 12/06/2009   Method used:   Electronically to        [facility] [address] (retail)       [address]       [place], [place]  [address]       Ph: [phone]       Fax: [phone]   RxID:   [id number]  Flu Vaccine Consent Questions     Do you have a history of severe allergic reactions to this vaccine? no    Any prior history of allergic  reactions to egg and/or gelatin? no    Do you have a sensitivity to the preservative Thimersol? no    Do you have a past  history of Guillan-Barre Syndrome? no    Do you currently have an acute febrile illness? no    Have you ever had a severe reaction to latex? no    Vaccine information given and explained to patient? yes    Are you currently pregnant? no    Lot Number:[id number]   Exp Date:09/16/2010   Site Given  Left Deltoid IM

## 2010-04-20 NOTE — Assessment & Plan Note (Signed)
Summary: 3 MONTHS ROV-[facility] with pt/mhf   Vital Signs:  Patient profile:   65 year old male Weight:      249.75 pounds BMI:     31.33 Temp:     97.6 degrees F oral Pulse rate:   82 / minute Pulse rhythm:   regular Resp:     18 per minute BP sitting:   130 / 80  (right arm) Cuff size:   large  Vitals Entered By: Glendell Docker CMA (November 25, 2008 2:12 PM)  Primary Care Provider:  Dondra Spry DO  CC:  3 Month Follow up and Type 2 diabetes mellitus follow-up.  History of Present Illness: 3 Month Follow up disease management  Type 2 Diabetes Mellitus Follow-Up      This is a 65 year old man who presents for Type 2 diabetes mellitus follow-up.  The patient denies self managed hypoglycemia and hypoglycemia requiring help.  The patient denies the following symptoms: chest pain.  Since the last visit the patient reports taking lantus but he freq forgets to use meal time insulin.  low blood sugar 67- high 200 avg 140  Htn - stable. (lab results reviewed)   Allergies (verified): No Known Drug Allergies  Past History:  Past Medical History: Coronary artery disease GERD  Hyperlipidemia Hypertension  Diabetes mellitus, type II Erectile dysfunction     Past Surgical History: Coronary artery bypass graft Rotator cuff repair       Family History: Mother died of coronary artery disease at age 72 Father is alive at age 59 with coronary artery disease Half-brother who has a history of stroke      Social History: Never Smoked Alcohol use-yes Married Occupation: truck Environmental manager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   Lungs:  normal respiratory effort and normal breath sounds.   Heart:  normal rate, regular rhythm, and no gallop.   Extremities:  No lower extremity edema   Diabetes Management Exam:    Foot Exam (with socks and/or shoes not present):       Inspection:          Left foot: normal          Right foot: normal    Impression & Recommendations:  Problem # 1:  DIABETES MELLITUS, TYPE II, UNCONTROLLED (ICD-250.02) A1c is slightly improved.   He is not using mealtime insulin.   He can not remember to take 15 mins before meal.   I suggest we switch to apidra and for pt to take insulin before dinner.    His updated medication list for this problem includes:    Metformin Hcl 1000 Mg Tabs (Metformin hcl) ..... One by mouth bid    Lantus Solostar 100 Unit/ml Soln (Insulin glargine) .Marland KitchenMarland KitchenMarland KitchenMarland Kitchen 30 units in am,  20 units pm    Low-dose Aspirin 81 Mg Tabs (Aspirin) .[name]... Take 1 tablet by mouth once a day    Lisinopril 10 Mg Tabs (Lisinopril) ..... One by mouth qd    Apidra Solostar 100 Unit/ml Soln (Insulin glulisine) ..... Use 10-20 units before evening meal  Labs Reviewed: Creat: 0.88 (11/19/2008)     Last Eye Exam: Background diabetic retinopathy (11/19/2007) Reviewed HgBA1c results: 8.2 (11/19/2008)  8.4 (08/24/2008)  Problem # 2:  HYPERTENSION (ICD-401.9) Stable.  Maintain current medication regimen.  His updated medication list for this problem includes:    Lisinopril 10 Mg Tabs (Lisinopril) ..... One by mouth qd  BP today: 130/80 Prior BP: 140/80 (09/22/2008)  Labs Reviewed: K+: 4.5 (11/19/2008) Creat: : 0.88 (11/19/2008)   Chol: 132 (08/24/2008)   HDL: 54.20 (08/24/2008)   LDL: 69 (08/24/2008)   TG: 46.0 (08/24/2008)  Complete Medication List: 1)  Lipitor 80 Mg Tabs (Atorvastatin calcium) .... Take 1 tablet by mouth once a day 2)  Metformin Hcl 1000 Mg Tabs (Metformin hcl) .... One by mouth bid 3)  Lantus Solostar 100 Unit/ml Soln (Insulin glargine) .... 30 units in am,  20 units pm 4)  Garlique 400 Mg Tbec (Garlic) .[name].. 1 once daily 5)  Multivitamins Tabs (Multiple vitamin) .... One by mouth once daily 6)  Bd Ultra-fine Pen Needles 29g X 12.27mm Misc (Insulin pen needle) .... Use three times a day as directed 7)  Cinnamon 500 Mg Caps (Cinnamon) .... Take 1 tablet by mouth two times a day 8)   Low-dose Aspirin 81 Mg Tabs (Aspirin) .... Take 1 tablet by mouth once a day 9)  Lisinopril 10 Mg Tabs (Lisinopril) .... One by mouth qd 10)  Apidra Solostar 100 Unit/ml Soln (Insulin glulisine) .... Use 10-20 units before evening meal 11)  Accu-chek Aviva Strp (Glucose blood) .... For three times a day testing 12)  Fish Oil 1000 Mg Caps (Omega-3 fatty acids) .... Take 1 tablet by mouth once a day 13)  Omeprazole 20 Mg Cpdr (Omeprazole) .... One by mouth once daily 14)  Gabapentin 300 Mg Caps (Gabapentin) .... One by mouth at bedtime prn 15)  Cialis 20 Mg Tabs (Tadalafil) .... One by mouth once daily as needed 16)  Tamsulosin Hcl 0.4 Mg Caps (Tamsulosin hcl) .... One by mouth qhs  Patient Instructions: 1)  Please schedule a follow-up appointment in 3 months. 2)  BMP prior to visit, ICD-9:  401.9 3)  HbgA1C prior to visit, ICD-9: 250.00 4)  Please return for lab work one (1) week before your next appointment.  Prescriptions: BD ULTRA-FINE PEN NEEDLES 29G X 12.7MM  MISC (INSULIN PEN NEEDLE) use three times a day as directed  #100 x 3   Entered and Authorized by:   D. Thomos Lemons DO   Signed by:   D. Thomos Lemons DO on 11/25/2008   Method used:   Print then Give to Patient   RxID:   [id number] GABAPENTIN 300 MG CAPS (GABAPENTIN) one by mouth at bedtime prn  #90 x 3   Entered and Authorized by:   D. Thomos Lemons DO   Signed by:   D. Thomos Lemons DO on 11/25/2008   Method used:   Print then Give to Patient   RxID:   [id number] OMEPRAZOLE 20 MG CPDR (OMEPRAZOLE) one by mouth once daily  #90 x 3   Entered and Authorized by:   D. Thomos Lemons DO   Signed by:   D. Thomos Lemons DO on 11/25/2008   Method used:   Print then Give to Patient   RxID:   [id number] LISINOPRIL 10 MG  TABS (LISINOPRIL) one by mouth qd  #90 x 3   Entered and Authorized by:   D. Thomos Lemons DO   Signed by:   D. Thomos Lemons DO on 11/25/2008   Method used:   Print then Give to Patient   RxID:   [id number]  LANTUS SOLOSTAR 100 UNIT/ML  SOLN (INSULIN GLARGINE) 30 Units in AM,  20 units PM  #3 month x 3   Entered and Authorized by:   D. Thomos Lemons DO   Signed by:  Dondra Spry DO on 11/25/2008   Method used:   Print then Give to Patient   RxID:   [id number] METFORMIN HCL 1000 MG  TABS (METFORMIN HCL) one by mouth bid  #180 x 3   Entered and Authorized by:   D. Thomos Lemons DO   Signed by:   D. Thomos Lemons DO on 11/25/2008   Method used:   Print then Give to Patient   RxID:   [id number] LIPITOR 80 MG  TABS (ATORVASTATIN CALCIUM) Take 1 tablet by mouth once a day  #90 x 3   Entered and Authorized by:   D. Thomos Lemons DO   Signed by:   D. Thomos Lemons DO on 11/25/2008   Method used:   Print then Give to Patient   RxID:   [id number] TAMSULOSIN HCL 0.4 MG CAPS (TAMSULOSIN HCL) one by mouth qhs  #90 x 3   Entered and Authorized by:   D. Thomos Lemons DO   Signed by:   D. Thomos Lemons DO on 11/25/2008   Method used:   Print then Give to Patient   RxID:   [id number] APIDRA SOLOSTAR 100 UNIT/ML SOLN (INSULIN GLULISINE) use 10-20 units before evening meal  #3 month x 3   Entered and Authorized by:   D. Thomos Lemons DO   Signed by:   D. Thomos Lemons DO on 11/25/2008   Method used:   Print then Give to Patient   RxID:   [id number]   Current Allergies (reviewed today): No known allergies

## 2010-04-20 NOTE — Assessment & Plan Note (Signed)
Summary: 3 MONTH FOLLOW UP/MHF   Vital Signs:  Patient profile:   65 year old male Weight:      250 pounds BMI:     31.36 O2 Sat:      97 % on Room air Temp:     97.7 degrees F oral Pulse rate:   94 / minute Pulse rhythm:   regular Resp:     18 per minute BP sitting:   146 / 90  (left arm) Cuff size:   large  Vitals Entered By: Glendell Docker CMA (February 24, 2009 3:31 PM)  O2 Flow:  Room air  Primary Care Provider:  D. Thomos Lemons DO  CC:   3 Month Follow up.  History of Present Illness: 3 Month Follow up disease management  65 year old white male for followup regarding  diabetes and postherpetic neuralgia. Patient states  right sided facial pain improved but not resolved. During episodes of severe pain patient did not take his insulin. Now that his pain is better his blood sugars have been better controlled with use of insulin.  low blood sugar 75 high 441 avg 200 - this am 107 prior to meal.  Preventive Screening-Counseling & Management  Alcohol-Tobacco     Smoking Status: never  Allergies (verified): No Known Drug Allergies  Past History:  Past Medical History: Coronary artery disease GERD  Hyperlipidemia     Hypertension  Diabetes mellitus, type II Erectile dysfunction       Past Surgical History: Coronary artery bypass graft  Rotator cuff repair            Family History: Mother died of coronary artery disease at age 49 Father is alive at age 81 with coronary artery disease Half-brother who has a history of stroke           Social History: Never Smoked Alcohol use-yes  Married Occupation: truck Hospital doctor            Review of Systems       constipation  Physical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   Head:  normocephalic and atraumatic.   Eyes:  R sclera with mild redness Lungs:  normal respiratory effort and normal breath sounds.   Heart:  normal rate, regular rhythm, and no gallop.    Diabetes Management Exam:  Foot Exam (with socks and/or shoes not present):       Inspection:          Left foot: normal          Right foot: normal   Impression & Recommendations:  Problem # 1:  POSTHERPETIC NEURALGIA (ICD-053.19) Assessment Improved continue Lyrica and carbamazepine.   Pt advised to reduce dose of Desipramine if he experiences excessive somnolence.  Problem # 2:  DIABETES MELLITUS, TYPE II, UNCONTROLLED (ICD-250.02) Assessment: Improved Pt notes improved compliance with insulin since facial pain is improved.  His updated medication list for this problem includes:    Metformin Hcl 1000 Mg Tabs (Metformin hcl) ..... One by mouth bid    Lantus Solostar 100 Unit/ml Soln (Insulin glargine) .Marland KitchenMarland KitchenMarland KitchenMarland Kitchen 35 units two times a day    Low-dose Aspirin 81 Mg Tabs (Aspirin) .[name]... Take 1 tablet by mouth once a day    Lisinopril 10 Mg Tabs (Lisinopril) ..... One by mouth qd    Apidra Solostar 100 Unit/ml Soln (Insulin glulisine) ..... Use 10-20 units three times a day before meals  Labs Reviewed: Creat: 0.87 (02/18/2009)     Last Eye Exam:  Background diabetic retinopathy (11/19/2007) Reviewed HgBA1c results: 8.9 (02/18/2009)  8.2 (11/19/2008)  Complete Medication List: 1)  Lipitor 80 Mg Tabs (Atorvastatin calcium) .... Take 1 tablet by mouth once a day 2)  Metformin Hcl 1000 Mg Tabs (Metformin hcl) .... One by mouth bid 3)  Lantus Solostar 100 Unit/ml Soln (Insulin glargine) .... 35 units two times a day 4)  Garlique 400 Mg Tbec (Garlic) .[name].. 1 once daily 5)  Multivitamins Tabs (Multiple vitamin) .... One by mouth once daily 6)  Bd Ultra-fine Pen Needles 29g X 12.12mm Misc (Insulin pen needle) .... Use three times a day as directed 7)  Cinnamon 500 Mg Caps (Cinnamon) .... Take 1 tablet by mouth two times a day 8)  Low-dose Aspirin 81 Mg Tabs (Aspirin) .... Take 1 tablet by mouth once a day 9)  Lisinopril 10 Mg Tabs (Lisinopril) .... One by mouth qd 10)  Apidra Solostar 100 Unit/ml Soln (Insulin glulisine)  .... Use 10-20 units three times a day before meals 11)  Accu-chek Aviva Strp (Glucose blood) .... For three times a day testing 12)  Fish Oil 1000 Mg Caps (Omega-3 fatty acids) .... Take 1 tablet by mouth once a day 13)  Omeprazole 20 Mg Cpdr (Omeprazole) .... One by mouth once daily 14)  Cialis 20 Mg Tabs (Tadalafil) .... One by mouth once daily as needed 15)  Tamsulosin Hcl 0.4 Mg Caps (Tamsulosin hcl) .... One by mouth qhs 16)  Valtrex 1 Gm Tabs (Valacyclovir hcl) .... Take 1 tablet by mouth once a day 17)  Lyrica 100 Mg Caps (Pregabalin) .... One by mouth three times a day 18)  Oxycodone-acetaminophen 5-325 Mg Tabs (Oxycodone-acetaminophen) .... One by mouth every 6 hours as needed for severe breakthrough pain 19)  Prednisolone Acetate 1 % Susp (Prednisolone acetate) .... One drop into right eye four times a day 20)  Timolol Maleate 0.5 % Solg (Timolol maleate) .... One drop right eye two times a day 21)  Lidocaine Hcl 2 % Gel (Lidocaine hcl) .... Apply small amout two times a day as needed 22)  Desipramine Hcl 25 Mg Tabs (Desipramine hcl) .... Two at bedtime 23)  Carbamazepine 200 Mg Tabs (Carbamazepine) .... Two two times a day 24)  Miralax Powd (Polyethylene glycol 3350) .Marland KitchenMarland KitchenMarland Kitchen 17 grams mixed with water two times a day  Patient Instructions: 1)  Please schedule a follow-up appointment in 3 months. 2)  BMP prior to visit, ICD-9:  401.9 3)  HbgA1C prior to visit, ICD-9: 250.02 4)  Please return for lab work one (1) week before your next appointment.  Prescriptions: MIRALAX  POWD (POLYETHYLENE GLYCOL 3350) 17 grams mixed with water two times a day  #1 month x 3   Entered and Authorized by:   D. Thomos Lemons DO   Signed by:   D. Thomos Lemons DO on 02/24/2009   Method used:   [id number]       [address]       [place], [place]  [address]       Ph: [phone]       Fax: [phone]   RxID:   [id number] TAMSULOSIN HCL 0.4 MG CAPS  (TAMSULOSIN HCL) one by mouth qhs  #90 x 3   Entered and Authorized by:   D. Thomos Lemons DO   Signed by:   D. Thomos Lemons DO on 02/24/2009   Method used:   Print then Give  to Patient   RxID:   [id number] OMEPRAZOLE 20 MG CPDR (OMEPRAZOLE) one by mouth once daily  #90 x 3   Entered and Authorized by:   D. Thomos Lemons DO   Signed by:   D. Thomos Lemons DO on 02/24/2009   Method used:   Print then Give to Patient   RxID:   [id number] ACCU-CHEK AVIVA  STRP (GLUCOSE BLOOD) for three times a day testing  #300 x 3   Entered and Authorized by:   D. Thomos Lemons DO   Signed by:   D. Thomos Lemons DO on 02/24/2009   Method used:   Print then Give to Patient   RxID:   [id number] APIDRA SOLOSTAR 100 UNIT/ML SOLN (INSULIN GLULISINE) use 10-20 units three times a day before meals  #3 month x 3   Entered and Authorized by:   D. Thomos Lemons DO   Signed by:   D. Thomos Lemons DO on 02/24/2009   Method used:   Print then Give to Patient   RxID:   [id number] LISINOPRIL 10 MG  TABS (LISINOPRIL) one by mouth qd  #90 x 3   Entered and Authorized by:   D. Thomos Lemons DO   Signed by:   D. Thomos Lemons DO on 02/24/2009   Method used:   Print then Give to Patient   RxID:   [id number] LANTUS SOLOSTAR 100 UNIT/ML  SOLN (INSULIN GLARGINE) 35 units two times a day  #3 month x 3   Entered and Authorized by:   D. Thomos Lemons DO   Signed by:   D. Thomos Lemons DO on 02/24/2009   Method used:   Print then Give to Patient   RxID:   [id number] METFORMIN HCL 1000 MG  TABS (METFORMIN HCL) one by mouth bid  #180 x 3   Entered and Authorized by:   D. Thomos Lemons DO   Signed by:   D. Thomos Lemons DO on 02/24/2009   Method used:   Print then Give to Patient   RxID:   [id number] LIPITOR 80 MG  TABS (ATORVASTATIN CALCIUM) Take 1 tablet by mouth once a day  #90 x 3   Entered and Authorized by:   D. Thomos Lemons DO   Signed by:   D. Thomos Lemons DO on 02/24/2009   Method used:   Print then Give to Patient    RxID:   [id number]   Current Allergies (reviewed today): No known allergies

## 2010-04-20 NOTE — Progress Notes (Signed)
Summary: Lab Results  Phone Note Outgoing Call   Summary of Call: call pt - labs stable.  I rec OV in 2 months.  Have pt bring blood sugar readings.  A1c should be scheduled before next ov  250.02. Initial call taken by: D. Thomos Lemons DO,  November 20, 2007 9:53 AM  Follow-up for Phone Call        patient informed per Dr Artist Pais Instructions, He states he will call back to schedule follow up appointment Follow-up by: Glendell Docker CMA,  November 20, 2007 4:58 PM

## 2010-04-20 NOTE — Assessment & Plan Note (Signed)
Summary: FU-STC  Medications Added MULTIVITAMINS   TABS (MULTIPLE VITAMIN) one by mouth once daily        Vital Signs:  Patient Profile:   65 Years Old Male Height:     75 inches Weight:      241.13 pounds Temp:     97.4 degrees F oral Pulse rate:   69 / minute BP sitting:   147 / 88  (right arm)  Vitals Entered By: Glendell Docker (February 10, 2007 2:20 PM)                 Chief Complaint:  Return office visit refil on  meds and Type 2 diabetes mellitus follow-up.  History of Present Illness:  Type 2 Diabetes Mellitus Follow-Up      This is a 65 year old man who presents for Type 2 diabetes mellitus follow-up.  The patient denies self managed hypoglycemia.  The patient denies the following symptoms: chest pain, vision loss, and foot ulcer.  Since the last visit the patient reports poor dietary compliance.  Since the last visit, the patient reports having had eye care by an ophthalmologist.  Complications from diabetes include peripheral neuropathy and sexual dysfunction.  Patient has history of poor compliance in the past.  His A1c in December of 2007 was 13.8. This improved to 12.0 in March of 2008.   We have had previous discussions regarding long-term complications of uncontrolled type 2 diabetes.  He also reports ongoing issues with erectile dysfunction.  He reports normal libido but previous attempts to improve erectile quality has been unsuccessful with various agents such as Viagra or cialis.  Current Allergies: No known allergies    Family History:    Reviewed history and no changes required:  Social History:    Reviewed history and no changes required:    Review of Systems      See HPI   Physical Exam  General:     Well-developed,well-nourished,in no acute distress; alert,appropriate and cooperative throughout examination Neck:     No deformities, masses, or tenderness noted. Lungs:     Normal respiratory effort, chest expands symmetrically. Lungs  are clear to auscultation, no crackles or wheezes. Heart:     Normal rate and regular rhythm. S1 and S2 normal without gallop, murmur, click, rub or other extra sounds. Pulses:     dorsalis pedis and posterior tibial pulses are full and equal bilaterally Extremities:     Decreased sensation to monofilament.  Neurologic:     No cranial nerve deficits noted. Station and gait are normal. Sensory, motor and coordinative functions appear intact. Psych:     Cognition and judgment appear intact. Alert and cooperative with normal attention span and concentration.    Impression & Recommendations:  Problem # 1:  DIABETES MELLITUS, TYPE II, UNCONTROLLED (ICD-250.02) Patient is not checking his blood sugars.  He has poor insight and poor dietary compliance.  We have tried referral to diabetic educator in the past.  Patient is willing to consider repeat referral to diabetic educator.  Compliance with medication is questionable.  Repeat A1c and microalbumin today. Updated with influenza and pneumovax today.   His updated medication list for this problem includes:    Metformin Hcl 500 Mg Tabs (Metformin hcl) .[name]... 1 two times a day    Lantus Solostar 100 Unit/ml Soln (Insulin glargine) .Marland KitchenMarland KitchenMarland KitchenMarland Kitchen 30 u two times a day  Labs Reviewed: HgBA1c: 12.0 (06/17/2006)   Creat: 0.8 (06/17/2006)      Problem #  2:  ERECTILE DYSFUNCTION (ICD-302.72) Poor response to viagra, cialis etc.  Refer to urology for other options.  Orders: Urology Referral (Urology) TLB-Testosterone, Total (84403-TESTO) T- * Misc. Laboratory test [phone])   Problem # 3:  HYPERLIPIDEMIA (ICD-272.4) Stressed importance of compliance.  His updated medication list for this problem includes:    Lipitor 80 Mg Tabs (Atorvastatin calcium) .[name]... 1 once daily  Labs Reviewed: Chol: 187 (06/17/2006)   HDL: 55.5 (06/17/2006)   LDL: 116 (06/17/2006)   TG: 80 (06/17/2006)   Complete Medication List: 1)  Lipitor 80 Mg Tabs (Atorvastatin  calcium) .[name].. 1 once daily 2)  Metformin Hcl 500 Mg Tabs (Metformin hcl) .[name].. 1 two times a day 3)  Lantus Solostar 100 Unit/ml Soln (Insulin glargine) .... 30 u two times a day 4)  Garlique 400 Mg Tbec (Garlic) .[name].. 1 once daily 5)  Multivitamins Tabs (Multiple vitamin) .... One by mouth once daily  Other Orders: Flu Vaccine 17yrs + (62694) Admin 1st Vaccine (85462) Pneumococcal Vaccine (70350) Admin of Any Addtl Vaccine (09381)   Patient Instructions: 1)  Please schedule a follow-up appointment in 6 weeks.    Prescriptions: LANTUS SOLOSTAR 100 UNIT/ML  SOLN (INSULIN GLARGINE) 30 U two times a day  #1 month x 2   Entered and Authorized by:   Dondra Spry DO   Signed by:   Dondra Spry DO on 02/10/2007   Method used:   [facility] ...       [facility] [phone][facility]       [address]       [place], [place]  [address]       Ph: [phone] or [phone]       Fax: [phone]   RxID:   [id number] METFORMIN HCL 500 MG  TABS (METFORMIN HCL) 1 two times a day  #60 x 2   Entered and Authorized by:   Dondra Spry DO   Signed by:   Dondra Spry DO on 02/10/2007   Method used:   Electronically sent to ...       [facility] [phone]*       [address]       [place], [place]  [address]       Ph: [phone] or [phone]       Fax: [phone]   RxID:   [id number] LIPITOR 80 MG  TABS (ATORVASTATIN CALCIUM) 1 once daily  #30 x 2   Entered and Authorized by:   Dondra Spry DO   Signed by:   Dondra Spry DO on 02/10/2007   Method used:   Electronically sent to ...       [facility] [phone]*       [address]       [place], [place]  [address]       Ph: [phone] or [phone]       Fax: [phone]   RxID:   [id number]  ]  Pneumovax Vaccine    Vaccine Type: Pneumovax    Site: left deltoid    Mfr: Merck    Dose: 0.5 ml    Route:  IM    Given by: Glendell Docker    Exp. Date: 08/09/2008    Lot #: [id number]    VIS given:  10/15/95 version given February 10, 2007.  Influenza Vaccine    Vaccine Type: Fluvax 3+    Site: right deltoid    Mfr: Sanofi Pasteur    Dose: 0.5 ml    Route: IM    Given by: Glendell Docker    Exp. Date: 09/16/2007    Lot #: [id number]    VIS given: 10/10/06 version given February 10, 2007.  Flu Vaccine Consent Questions    Do you have a history of severe allergic reactions to this vaccine? no    Any prior history of allergic reactions to egg and/or gelatin? no    Do you have a sensitivity to the preservative Thimersol? no    Do you have a past history of Guillan-Barre Syndrome? no    Do you currently have an acute febrile illness? no    Have you ever had a severe reaction to latex? no    Vaccine information given and explained to patient? yes

## 2010-04-20 NOTE — Progress Notes (Signed)
  Phone Note Call from Patient   Reason for Call: Refill Medication Details for Reason: Insulin needles Summary of Call: Please Refill for some insulin needles AS soon as possible. Pt. is about to run out. [facility] [place] [phone] Call pt. with any problems at [phone] Initial call taken by: Michaelle Copas,  June 22, 2008 9:28 AM  Follow-up for Phone Call        refill x 2.   Needs ov for more refills Follow-up by: D. Thomos Lemons DO,  June 22, 2008 12:37 PM  Additional Follow-up for Phone Call Additional follow up Details #1::        patient advised per Dr Artist Pais instructions Additional Follow-up by: Glendell Docker CMA,  June 22, 2008 2:21 PM

## 2010-04-20 NOTE — Assessment & Plan Note (Signed)
Summary: Cardiology Nuclear Study  Nuclear Med Background Indications for Stress Test: Evaluation for Ischemia, Graft Patency   History: CABG, Heart Catheterization, Myocardial Perfusion Study  History Comments: '96 CABG x3 03/06 MPS mild apical ischemia EF 51% 12/06 Heart Cath EF 50% mild vein graft DZ Med TX  Symptoms: Chest Pain, DOE, Fatigue, Palpitations, SOB    Nuclear Pre-Procedure Cardiac Risk Factors: Family History - CAD, Hypertension, IDDM Type 2, Lipids Caffeine/Decaff Intake: None NPO After: 8:30 AM Lungs: clear IV 0.9% NS with Angio Cath: 18g     IV Site: (R) AC IV Started by: Stanton Kidney EMT-P Chest Size (in) 46     Height (in): 75 Weight (lb): 250 BMI: 31.36 Tech Comments: CBG= 140 today after breakfast, per Patient.  Nuclear Med Study 1 or 2 day study:  1 day     Stress Test Type:  Stress Reading MD:  Marca Ancona, MD     Referring MD:  B.Crenshaw Resting Radionuclide:  Technetium 29m Tetrofosmin     Resting Radionuclide Dose:  11 mCi  Stress Radionuclide:  Technetium 58m Tetrofosmin     Stress Radionuclide Dose:  33 mCi   Stress Protocol Exercise Time (min):  4:15 min     Max HR:  144 bpm     Predicted Max HR:  155 bpm  Max Systolic BP: 189 mm Hg     Percent Max HR:  92.90 %     METS: 6.10 Rate Pressure Product:  [address]    Stress Test Technologist:  Milana Na EMT-P     Nuclear Technologist:  Domenic Polite CNMT  Rest Procedure  Myocardial perfusion imaging was performed at rest 45 minutes following the intravenous administration of Myoview Technetium 47m Tetrofosmin.  Stress Procedure  The patient exercised for 4:15. The patient stopped due to fatigue, sob, and denied any chest pain.  There were no significant ST-T wave changes, and rare pvcs.  Myoview was injected at peak exercise and myocardial perfusion imaging was performed after a brief delay.  QPS Raw Data Images:  Normal; no motion artifact; normal heart/lung ratio. Stress Images:   Basal to mid inferior and basal inferoseptal perfusion defect.  Rest Images:  Normal homogeneous uptake in all areas of the myocardium. Subtraction (SDS):  Reversible basal to mid inferior and basal inferoseptal perfusion defect.  Transient Ischemic Dilatation:  .94  (Normal <1.22)  Lung/Heart Ratio:  .36  (Normal <0.45)  Quantitative Gated Spect Images QGS EDV:  118 ml QGS ESV:  62 ml QGS EF:  47 % QGS cine images:  Inferior and septal hypokinesis.    Overall Impression  Exercise Capacity: Below average. BP Response: Normal blood pressure response. Clinical Symptoms: Short of breath, fatigued ECG Impression: No significant ST segment change suggestive of ischemia. Overall Impression: Reversible basal to mid inferior and basal inferoseptal perfusion defect suggestive of ischemia.  Mildly decreased LV systolic function with inferior and septal hypokinesis.  Overall Impression Comments: Prominent RV on planar images.   Appended Document: Cardiology Nuclear Study schedule f/u ov  Appended Document: Cardiology Nuclear Study PT AWARE./CY

## 2010-04-20 NOTE — Medication Information (Signed)
Summary: CPAP Supplies/[facility]  CPAP Supplies/[facility]   Imported By: Sherian Rein 11/08/2009 08:48:17  _____________________________________________________________________  External Attachment:    Type:   Image     Comment:   External Document

## 2010-04-20 NOTE — Miscellaneous (Signed)
Summary: Zostavax  Clinical Lists Changes  Observations: Added new observation of ZOSTAVAX: Zostavax (05/30/2009 10:13)      Immunization History:  Zostavax History:    Zostavax # 1:  zostavax (05/30/2009)

## 2010-04-20 NOTE — Letter (Signed)
Summary: [facility] - ER Report  [facility] - ER Report   Imported By: Marylou Mccoy 02/17/2010 10:31:27  _____________________________________________________________________  External Attachment:    Type:   Image     Comment:   External Document

## 2010-04-20 NOTE — Letter (Signed)
Summary: [facility] Consult Scheduled Letter  [facility]  [address] [place], [place] [address]   Phone: [phone]  Fax: [phone]      08/20/2007 MRN: [id number]  [facility] [address] [place], [place]  [address]    Dear Mr. MANCUSO,      We have scheduled an appointment for you.  At the recommendation of Dr.Yoo, we have scheduled you a consult with Dr Nile Riggs on 09/01/07 at 9:20am.  Their phone number is [phone].  If this appointment day and time is not convenient for you, please feel free to call the office of the doctor you are being referred to at the number listed above and reschedule the appointment.    [facility] [address] [place] [address]   *Please make sure that your blood sugar is under control because it will affect your eye exam.If not under control please call 24 hrs a head to reschedule.*    Thank you,   Patient Care Coordinator [facility]

## 2010-04-20 NOTE — Letter (Signed)
Summary: [facility] - Chest AP/PA  [facility] - Chest AP/PA   Imported By: Marylou Mccoy 02/17/2010 11:15:35  _____________________________________________________________________  External Attachment:    Type:   Image     Comment:   External Document

## 2010-04-20 NOTE — Progress Notes (Signed)
Summary: Medication Clarification  Phone Note From Pharmacy   [phone] Call For: Dr Artist Pais  Summary of Call: [facility] [place] [facility] for clarification of patients prostate medication. She states patient picked up a rx for Flomax 0.4 mg, and had a rx transferred from [place] in [place] for Finasteride 5mg  once daily . She states if patient is to take both medication he will need a refill on the Finasteride. Initial call taken by: Glendell Docker CMA,  December 09, 2009 4:29 PM  Follow-up for Phone Call        pt advised to stop tamsulosin ok to refill finasteride Follow-up by: D. Thomos Lemons DO,  December 11, 2009 8:04 PM  Additional Follow-up for Phone Call Additional follow up Details #1::        Phone call completed, Pharmacist called, rx sent  Additional Follow-up by: Glendell Docker CMA,  December 12, 2009 8:41 AM    Prescriptions: FINASTERIDE 5 MG TABS (FINASTERIDE) one by mouth once daily  #30 x 3   Entered by:   Glendell Docker CMA   Authorized by:   D. Thomos Lemons DO   Signed by:   Glendell Docker CMA on 12/12/2009   Method used:   Telephoned to ...       [facility] [address] [facility] [address] ([facility]ail)       [address]       [place], [place]  [address]       Ph: [phone]       Fax: [phone]   RxID:   [id number]

## 2010-04-20 NOTE — Letter (Signed)
Summary: [facility]  [facility]   Imported By: Lanelle Bal 08/24/2009 13:24:35  _____________________________________________________________________  External Attachment:    Type:   Image     Comment:   External Document

## 2010-04-20 NOTE — Assessment & Plan Note (Signed)
Summary: [facility]      Allergies Added: NKDA  Visit Type:  Initial Consult Primary Provider:  Dondra Spry DO  CC:  CA Sob and Tiredness.  History of Present Illness: 65 year old male with past medical history of coronary artery disease status post coronary artery bypass graft for evaluation of dyspnea. Last cardiac catheterization in 2006 revealed an ejection fraction of 50%. There was a 50% left main. The LAD was occluded and the diagonal was severely diseased proximally. The LIMA to the LAD was patent. The circumflex had a marginal that was occluded. There was a second marginal with an 80-90% lesion. There is a totally occluded fourth marginal. The right coronary was occluded. The saphenous vein graft to the right coronary artery had a 40% lesion. The saphenous vein graft to the OM showed mild disease. Note previous carotid Dopplers in October of 2001 showed no significant stenosis. Previous abdominal ultrasound in 2003 showed no aneurysm. He has not been seen in the [facility] office since January of 2007. The patient states that for the past 6 months he has noticed increasing dyspnea on exertion relieved with rest. There is no associated chest pain. There is no orthopnea, PND, pedal edema, syncope. He occasionally feels a discomfort in his upper chest after working. They last for several minutes and resolve spontaneously. It does not radiate.  Current Medications (verified): 1)  Lipitor 80 Mg  Tabs (Atorvastatin Calcium) .... Take 1 Tablet By Mouth Once A Day 2)  Metformin Hcl 1000 Mg  Tabs (Metformin Hcl) .... One By Mouth Bid 3)  Lantus Solostar 100 Unit/ml  Soln (Insulin Glargine) .... 30 Units in Am and 20 Units in Pm Subcutaneously 4)  Garlique 400 Mg  Tbec (Garlic) .[name].. 1 Once Daily 5)  Multivitamins   Tabs (Multiple Vitamin) .... One By Mouth Once Daily 6)  Bd Ultra-Fine Pen Needles 29g X 12.72mm  Misc (Insulin Pen Needle) .... Use Three Times A Day As Directed 7)  Low-Dose  Aspirin 81 Mg  Tabs (Aspirin) .... Take 1 Tablet By Mouth Once A Day 8)  Lisinopril 10 Mg  Tabs (Lisinopril) .... One By Mouth Qd 9)  Apidra Solostar 100 Unit/ml Soln (Insulin Glulisine) .... Use 10 - 15 Units Subcutaneously   Three Times A Day Before Meals 10)  Accu-Chek Aviva  Strp (Glucose Blood) .... For Three Times A Day Testing 11)  Fish Oil 1000 Mg Caps (Omega-3 Fatty Acids) .... Take 1 Tablet By Mouth Once A Day 12)  Omeprazole 20 Mg Cpdr (Omeprazole) .... One By Mouth Once Daily 13)  Cialis 20 Mg Tabs (Tadalafil) .... One By Mouth Once Daily As Needed 14)  Tamsulosin Hcl 0.4 Mg Caps (Tamsulosin Hcl) .... One By Mouth Qhs 15)  Valtrex 1 Gm Tabs (Valacyclovir Hcl) .... Take 1 Tablet By Mouth Once A Day 16)  Lyrica 100 Mg Caps (Pregabalin) .... One By Mouth Three Times A Day 17)  Desipramine Hcl 25 Mg Tabs (Desipramine Hcl) .... Two At Bedtime 18)  Carbamazepine 200 Mg Tabs (Carbamazepine) .... Two Two Times A Day 19)  Miralax  Powd (Polyethylene Glycol 3350) .Marland KitchenMarland KitchenMarland Kitchen 17 Grams Mixed With Water Two Times A Day 20)  Zostavax 16109 Unt/0.38ml Solr (Zoster Vaccine Live) .... Administer Vaccine X 1 21)  Lidoderm 5 % Ptch (Lidocaine) .... Apply Patch Q12 Hrs Once Daily At 9:00 Pm (Take Off 9:00 Am)  Allergies (verified): No Known Drug Allergies  Past History:  Past Medical History: Current Problems:  HYPERTENSION (ICD-401.9)  HYPERLIPIDEMIA (ICD-272.4) CORONARY ARTERY DISEASE (ICD-414.00) OSTEOARTHRITIS (ICD-715.90) POSTHERPETIC NEURALGIA (ICD-053.19) HERPES ZOSTER (ICD-053.9) CONTACT DERMATITIS (ICD-692.9) ADJUSTMENT DISORDER WITH MIXED FEATURES (ICD-309.28) DIABETIC PERIPHERAL NEUROPATHY (ICD-250.60) ERECTILE DYSFUNCTION (ICD-302.72) DIABETES MELLITUS, TYPE II, UNCONTROLLED (ICD-250.02) OBESITY (ICD-278.00) GERD (ICD-530.81)   Past Surgical History: coronary artery bypass graft in 1996 with a LIMA to the LAD, saphenous vein graft to the acute marginal, saphenous vein graft to the  PDA and a saphenous vein graft to the circumflex. Right knee arthroscopic surgery in March of 2006 s/p bilateral knee replacement Shoulder surgery  Family History: Reviewed history from 05/24/2009 and no changes required. Mother died of coronary artery disease at age 74 Father is alive at age 38 with coronary artery disease Half-brother who has a history of stroke            Social History: Reviewed history from 05/24/2009 and no changes required. Never Smoked Alcohol use-yes   Married Occupation: truck driver            Review of Systems       no fevers or chills, productive cough, hemoptysis, dysphasia, odynophagia, melena, hematochezia, dysuria, hematuria, rash, seizure activity, orthopnea, PND, pedal edema, claudication. Remaining systems are negative.   Vital Signs:  Patient profile:   65 year old male Height:      75 inches Weight:      251.75 pounds BMI:     31.58 Pulse rate:   88 / minute Pulse rhythm:   regular Resp:     18 per minute BP sitting:   146 / 84  (left arm) Cuff size:   large  Vitals Entered By: Vikki Ports (June 08, 2009 10:49 AM)  Physical Exam  General:  Well developed/well nourished in NAD Skin warm/dry Patient not depressed No peripheral clubbing Back-normal HEENT-normal/normal eyelids Neck supple/normal carotid upstroke bilaterally; no bruits; no JVD; no thyromegaly chest - CTA/ normal expansion CV - RRR/normal S1 and S2; no murmurs, rubs or gallops;  PMI nondisplaced Abdomen -NT/ND, no HSM, no mass, + bowel sounds, no bruit 2+ femoral pulses, no bruits Ext-no edema, chords, 2+ DP Neuro-grossly nonfocal     EKG  Procedure date:  06/08/2009  Findings:      Sinus rhythm at a rate of 88. Axis normal. Cannot rule out prior septal infarct. Nonspecific ST changes.  Impression & Recommendations:  Problem # 1:  DYSPNEA (ICD-786.05) Patient not volume overloaded on examination. However he has multiple risk factors and his grafts  are now 65 years old. I will schedule a Myoview for risk stratification. We will have a low threshold for cardiac catheterization. I will schedule an echocardiogram to quantify LV function and to exclude valvular disease although I do not hear murmurs on examination. I will check a BNP. His updated medication list for this problem includes:    Low-dose Aspirin 81 Mg Tabs (Aspirin) .[name]... Take 1 tablet by mouth once a day    Lisinopril 10 Mg Tabs (Lisinopril) ..... One by mouth qd  Problem # 2:  CHEST PAIN (ICD-786.50) Symptoms atypical. Myoview for risk stratification as described above. His updated medication list for this problem includes:    Low-dose Aspirin 81 Mg Tabs (Aspirin) .[name]... Take 1 tablet by mouth once a day    Lisinopril 10 Mg Tabs (Lisinopril) ..... One by mouth qd  Problem # 3:  HYPERTENSION (ICD-401.9) His blood pressure is mildly elevated. I've asked him to follow this at home and keep records. When he returns we will review this and  increase his lisinopril as needed. His updated medication list for this problem includes:    Low-dose Aspirin 81 Mg Tabs (Aspirin) .[name]... Take 1 tablet by mouth once a day    Lisinopril 10 Mg Tabs (Lisinopril) ..... One by mouth qd  Problem # 4:  HYPERLIPIDEMIA (ICD-272.4) Continue statin. Lipids and liver monitored by his primary care. His updated medication list for this problem includes:    Lipitor 80 Mg Tabs (Atorvastatin calcium) .[name]... Take 1 tablet by mouth once a day  Problem # 5:  CORONARY ARTERY DISEASE (ICD-414.00) Continue aspirin, ACE inhibitor and statin. His updated medication list for this problem includes:    Low-dose Aspirin 81 Mg Tabs (Aspirin) .[name]... Take 1 tablet by mouth once a day    Lisinopril 10 Mg Tabs (Lisinopril) ..... One by mouth qd  Orders: Nuclear Stress Test (Nuc Stress Test)  Problem # 6:  HERPES ZOSTER (ICD-053.9)  Problem # 7:  DIABETES MELLITUS, TYPE II, UNCONTROLLED (ICD-250.02) Management per primary  care. His updated medication list for this problem includes:    Metformin Hcl 1000 Mg Tabs (Metformin hcl) ..... One by mouth bid    Lantus Solostar 100 Unit/ml Soln (Insulin glargine) .Marland KitchenMarland KitchenMarland KitchenMarland Kitchen 30 units in am and 20 units in pm subcutaneously    Low-dose Aspirin 81 Mg Tabs (Aspirin) .[name]... Take 1 tablet by mouth once a day    Lisinopril 10 Mg Tabs (Lisinopril) ..... One by mouth qd    Apidra Solostar 100 Unit/ml Soln (Insulin glulisine) ..... Use 10 - 15 units subcutaneously   three times a day before meals  Problem # 8:  GERD (ICD-530.81)  His updated medication list for this problem includes:    Omeprazole 20 Mg Cpdr (Omeprazole) ..... One by mouth once daily  Other Orders: T-BNP  (B Natriuretic Peptide) ([id number])  Patient Instructions: 1)  Your physician recommends that you schedule a follow-up appointment in: 4-6 WEEKS 2)  Your physician has requested that you have an echocardiogram.  Echocardiography is a painless test that uses sound waves to create images of your heart. It provides your doctor with information about the size and shape of your heart and how well your heart's chambers and valves are working.  This procedure takes approximately one hour. There are no restrictions for this procedure. 3)  Your physician has requested that you have an exercise stress myoview.  For further information please visit [email or web address]  Please follow instruction sheet, as given.

## 2010-04-20 NOTE — Progress Notes (Signed)
Summary: Pen needle refills  Phone Note Refill Request Message from:  Patient on April 01, 2009 9:54 AM  Refills Requested: Medication #1:  BD ULTRA-FINE PEN NEEDLES 29G X 12.7MM  MISC use three times a day as directed   Dosage confirmed as above?Dosage Confirmed   Supply Requested: 3 months pt. states that he only has 2 needles left so he needs this called in today, and due to his insurance he needs a 90 day supply with 3 refills. Thank you  Next Appointment Scheduled: 05/17/09 [place] [facility] appt. Initial call taken by: Michaelle Copas,  April 01, 2009 9:55 AM    Prescriptions: BD ULTRA-FINE PEN NEEDLES 29G X 12.7MM  MISC (INSULIN PEN NEEDLE) use three times a day as directed  #270 x 1   Entered by:   Glendell Docker CMA   Authorized by:   D. Thomos Lemons DO   Signed by:   Glendell Docker CMA on 04/01/2009   Method used:   Electronically to        [facility] [address] (retail)       [address]       [place], [place]  [address]       Ph: [phone]       Fax: [phone]   RxID:   [id number]

## 2010-04-20 NOTE — Progress Notes (Signed)
Summary: results  Phone Note Call from Patient Call back at [phone]   Caller: Spouse glenna Call For: alva Summary of Call: returning nurse call back on results. Initial call taken by: Rickard Patience,  December 19, 2009 3:35 PM  Follow-up for Phone Call        Pt informed of results as stated in append from 9.28.11 by RA.  He verbalized understanding.  Gweneth Dimitri RN  December 19, 2009 3:44 PM

## 2010-04-20 NOTE — Assessment & Plan Note (Signed)
Summary: [facility]   Visit Type:  Follow-up Referring Provider:  pcp Primary Provider:  Dondra Spry DO  CC:  Weakness and Sob.  History of Present Illness: Pleasant male with past medical history of coronary artery disease status post coronary artery bypass graft that I have seen previously for evaluation of dyspnea. Note previous carotid Dopplers in October of 2001 showed no significant stenosis. Previous abdominal ultrasound in 2003 showed no aneurysm. A previous BNP was normal at 31. A Myoview in March of 2011 revealed an ejection fraction of 47% and inferior ischemia. The RV was prominent. An echocardiogram in March of 2011 revealed normal LV function and mild left atrial enlargement. A cardiac catheterization in May of 2011 revealed triple-vessel coronary artery disease status post three-vessel CABG with 3/3 patent grafts. Severe stenosis in the proximal and distal body of the saphenous vein graft to the left-sided PDA. Normal left ventricular systolic function. Patient had PCI with a drug-eluting stent at that time. Had repeat cardiac cath in Dec of 2011 for dyspnea and f/u abnormal myoview. This revealed  and EF of 50. Continued patency internal mammary to the LAD. Occluded saphenous vein graft to the recent percutaneous intervention to the distal circumflex/PDA territory. Progressive disease in the saphenous vein graft to the OM. Patient evaluated by CVTS. Distal vessels felt to be poor targets. CABG could be considered in the future if PCI options were exhausted. Patient had PCI of the saphenous vein graft to the obtuse marginal. Since then, he continues to have dyspnea on exertion. There is no orthopnea, PND, pedal edema, syncope or chest pain. He notices significant fatigue and general malaise.   Current Medications (verified): 1)  Lipitor 80 Mg Tabs (Atorvastatin Calcium) .... Take One Tablet By Mouth Daily. 2)  Metformin Hcl 1000 Mg  Tabs (Metformin Hcl) .... One By Mouth  Bid 3)  Lantus Solostar 100 Unit/ml  Soln (Insulin Glargine) .[name].. 15 Units in Am and 10 Units in Pm Subcutaneously 4)  Pen Needles 5/16" 31g X 8 Mm Misc (Insulin Pen Needle) .... Use As Directed 6 X Daily 5)  Aspirin Ec 325 Mg Tbec (Aspirin) .... Take One Tablet By Mouth Daily 6)  Lisinopril 20 Mg Tabs (Lisinopril) .... One By Mouth Once Daily 7)  Apidra Solostar 100 Unit/ml Soln (Insulin Glulisine) .... Use 5 - 10 Units Subcutaneously   Three Times A Day Before Meals 8)  Accu-Chek Aviva  Strp (Glucose Blood) .... For Three Times A Day Testing 9)  Omeprazole 20 Mg Cpdr (Omeprazole) .... One By Mouth Once Daily 10)  Miralax  Powd (Polyethylene Glycol 3350) .Marland KitchenMarland KitchenMarland Kitchen 17 Grams Mixed With Water Two Times A Day 11)  Finasteride 5 Mg Tabs (Finasteride) .... One By Mouth Once Daily 12)  Effient 10 Mg Tabs (Prasugrel Hcl) .... Take 1 Tablet By Mouth Once A Day 13)  Bupropion Hcl 150 Mg Xr24h-Tab (Bupropion Hcl) .... One By Mouth Once Daily 14)  Diltiazem Hcl Cr 240 Mg Xr24h-Cap (Diltiazem Hcl) .... Take 1 Capsule By Mouth Once A Day 15)  Tamsulosin Hcl 0.4 Mg Caps (Tamsulosin Hcl) .... Take 1 Capsule By Mouth Once A Day  Allergies (verified): No Known Drug Allergies  Vital Signs:  Patient profile:   65 year old male Height:      74 inches Weight:      247 pounds BMI:     31.83 Pulse rate:   81 / minute Pulse rhythm:   regular Resp:     20 per  minute BP sitting:   110 / 70  (right arm) Cuff size:   large  Vitals Entered By: Vikki Ports (March 08, 2010 3:20 PM)   Past History:  Past Medical History: Reviewed history from 02/07/2010 and no changes required. CORONARY ARTERY DISEASE (ICD-414.00) DIABETES MELLITUS, TYPE II, UNCONTROLLED (ICD-250.02) HYPERTENSION (ICD-401.9) HYPERLIPIDEMIA (ICD-272.4)   OSTEOARTHRITIS (ICD-715.90) POSTHERPETIC NEURALGIA (ICD-053.19)  HERPES ZOSTER (ICD-053.9) ADJUSTMENT DISORDER WITH MIXED FEATURES (ICD-309.28) DIABETIC PERIPHERAL NEUROPATHY  (ICD-250.60) ERECTILE DYSFUNCTION (ICD-302.72) OBESITY (ICD-278.00) GERD (ICD-530.81)   Past Surgical History: Coronary artery bypass graft in 1996 with a LIMA to the LAD, saphenous vein graft to the acute marginal, saphenous vein graft to the PDA and a saphenous vein graft to the circumflex. Right knee arthroscopic surgery in March of 2006 s/p bilateral knee replacement   Shoulder surgery  Social History: Reviewed history from 02/03/2010 and no changes required. Never Smoked Alcohol use-yes     Married Occupation: truck Hospital doctor, retired              Review of Systems       no fevers or chills, productive cough, hemoptysis, dysphasia, odynophagia, melena, hematochezia, dysuria, hematuria, rash, seizure activity, orthopnea, PND, pedal edema, claudication. Remaining systems are negative.   Vital Signs:  Patient profile:   65 year old male Height:      74 inches Weight:      247 pounds BMI:     31.83 Pulse rate:   81 / minute Pulse rhythm:   regular Resp:     20 per minute BP sitting:   110 / 70  (right arm) Cuff size:   large  Vitals Entered By: Vikki Ports (March 08, 2010 3:20 PM)  Physical Exam  General:  Well-developed well-nourished in no acute distress.  Skin is warm and dry.  HEENT is normal.  Neck is supple. No thyromegaly.  Chest is clear to auscultation with normal expansion.  Cardiovascular exam is regular rate and rhythm.  Abdominal exam nontender or distended. No masses palpated. Mild ecchymosis but no bruit bilaterally. Extremities show no edema. neuro grossly intact    EKG  Procedure date:  03/08/2010  Findings:      Sinus rhythm at a rate of 8 and one. Axis normal. Nonspecific ST changes.  Impression & Recommendations:  Problem # 1:  DYSPNEA (ICD-786.05) The patient continues to have dyspnea. I am not convinced this is all cardiac related. Plan medical therapy at this point. He potentially could have redo bypass surgery in the future but  he would be higher risk given that he would be a redo and his distal vessels are not great targets. He is complaining of fatigue. I wonder if his blood pressure is running low. I will decrease his Cardizem CD to 120 mg p.o. daily. Check TSH. His updated medication list for this problem includes:    Aspirin Ec 325 Mg Tbec (Aspirin) .[name]... Take one tablet by mouth daily    Lisinopril 20 Mg Tabs (Lisinopril) ..... One by mouth once daily    Diltiazem Hcl Cr 120 Mg Xr12h-cap (Diltiazem hcl) .[name]... 1 qd  Problem # 2:  HYPERTENSION (ICD-401.9) Decrease Cardizem as described above. His updated medication list for this problem includes:    Aspirin Ec 325 Mg Tbec (Aspirin) .[name]... Take one tablet by mouth daily    Lisinopril 20 Mg Tabs (Lisinopril) ..... One by mouth once daily    Diltiazem Hcl Cr 120 Mg Xr12h-cap (Diltiazem hcl) .[name]... 1 qd  His updated medication  list for this problem includes:    Aspirin Ec 325 Mg Tbec (Aspirin) .[name]... Take one tablet by mouth daily    Lisinopril 20 Mg Tabs (Lisinopril) ..... One by mouth once daily    Diltiazem Hcl Cr 240 Mg Xr24h-cap (Diltiazem hcl) .[name]... Take 1 capsule by mouth once a day  Problem # 3:  HYPERLIPIDEMIA (ICD-272.4)  Continue statin. His updated medication list for this problem includes:    Lipitor 80 Mg Tabs (Atorvastatin calcium) .[name]... Take one tablet by mouth daily.  His updated medication list for this problem includes:    Lipitor 80 Mg Tabs (Atorvastatin calcium) .[name]... Take one tablet by mouth daily.  Problem # 4:  CORONARY ARTERY DISEASE (ICD-414.00) Continue aspirin, effient, ACE inhibitor and statin. His updated medication list for this problem includes:    Aspirin Ec 325 Mg Tbec (Aspirin) .[name]... Take one tablet by mouth daily    Lisinopril 20 Mg Tabs (Lisinopril) ..... One by mouth once daily    Effient 10 Mg Tabs (Prasugrel hcl) .[name]... Take 1 tablet by mouth once a day    Diltiazem Hcl Cr 120 Mg Xr12h-cap (Diltiazem hcl) .[name]... 1  qd  Problem # 5:  DIABETES MELLITUS, TYPE II, UNCONTROLLED (ICD-250.02)  His updated medication list for this problem includes:    Metformin Hcl 1000 Mg Tabs (Metformin hcl) ..... One by mouth bid    Lantus Solostar 100 Unit/ml Soln (Insulin glargine) .Marland KitchenMarland KitchenMarland KitchenMarland Kitchen 15 units in am and 10 units in pm subcutaneously    Aspirin Ec 325 Mg Tbec (Aspirin) .[name]... Take one tablet by mouth daily    Lisinopril 20 Mg Tabs (Lisinopril) ..... One by mouth once daily    Apidra Solostar 100 Unit/ml Soln (Insulin glulisine) ..... Use 5 - 10 units subcutaneously   three times a day before meals  His updated medication list for this problem includes:    Metformin Hcl 1000 Mg Tabs (Metformin hcl) ..... One by mouth bid    Lantus Solostar 100 Unit/ml Soln (Insulin glargine) .Marland KitchenMarland KitchenMarland KitchenMarland Kitchen 15 units in am and 10 units in pm subcutaneously    Aspirin Ec 325 Mg Tbec (Aspirin) .[name]... Take one tablet by mouth daily    Lisinopril 20 Mg Tabs (Lisinopril) ..... One by mouth once daily    Apidra Solostar 100 Unit/ml Soln (Insulin glulisine) ..... Use 5 - 10 units subcutaneously   three times a day before meals  Other Orders: EKG w/ Interpretation (93000)  Patient Instructions: 1)  Your physician recommends that you schedule a follow-up appointment in: 3 MONTHS WITH DOCTOR CRENSHAW 2)  Your physician has recommended you make the following change in your medication: DECREASE CARDIZEM TO 120MG  EVERY DAY 3)  Your physician recommends that you return for lab work in: TODAY  TSH  Prescriptions: DILTIAZEM HCL CR 120 MG XR12H-CAP (DILTIAZEM HCL) 1 qd  #90 x 3   Entered by:   Scherrie Bateman, LPN   Authorized by:   Ferman Hamming, MD, [facility]   Signed by:   Scherrie Bateman, LPN on 16/12/9602   Method used:   Print then Give to Patient   RxID:   [id number]

## 2010-04-20 NOTE — Assessment & Plan Note (Signed)
Summary: pain from shingles/mhf   Vital Signs:  Patient profile:   65 year old male Weight:      234.50 pounds BMI:     29.42 Temp:     98.2 degrees F oral Pulse rate:   110 / minute Pulse rhythm:   regular BP sitting:   162 / 90  (left arm) Cuff size:   large  Vitals Entered By: Glendell Docker CMA (January 13, 2009 1:19 PM)  Primary Care Provider:  Dondra Spry DO  CC:  Head Pain.  History of Present Illness: Head pain,feels pain inthe back of head, radiates tto eye socket and end of his nose.  Pain starts out like a pin sticking sensation, last 10- 15 minutes and then his head feels like his head is going to explode due to severe head pain. Face becomes real pale and after the attack his face beomes real red. He state his symptoms are relieved by the coolness.  Tried the lidocaine gel, however it got in his eye and mouth by accident and he complains that it caused severe burning.    He was evaluted by eye Dr yesterday and was to follow up with Pcp for his unresolved head pain. He was instructed to complete the prescribed medication and follow up in 30 days. Patient is requesting MRI.  Allergies (verified): No Known Drug Allergies  Review of Systems  The patient denies muscle weakness.         Denies visual changes in right eye.  Denies rash or lesions- resolved.  Physical Exam  Eyes:  pupils equal, pupils round, pupils reactive to light, no no retinal abnormalitiies noted on exam. R sclera with mild redness Lungs:  Normal respiratory effort, chest expands symmetrically. Lungs are clear to auscultation, no crackles or wheezes. Heart:  Normal rate and regular rhythm. S1 and S2 normal without gallop, murmur, click, rub or other extra sounds. Abdomen:  Bowel sounds positive,abdomen soft and non-tender without masses, organomegaly or hernias noted.   Impression & Recommendations:  Problem # 1:  POSTHERPETIC NEURALGIA (ICD-053.19)  Case discussed with Dr. Artist Pais,  we will  increase his lyrica to 100mg  by mouth Q8 hrs and continue to use percocet for breakthrough pain.  I explained to him that there are no neurologic changes on exam today that would be an indication for MRI.  We will however refer the patient to neurology for further evaluation of his postherpetic neuralgia.    Orders: Neurology Referral (Neuro)  Problem # 2:  HYPERTENSION (ICD-401.9) BP is up today.  I suspect that this is due to severe pain.  Plan to review BP next visit. If BP remains elevated consider adding second agent.   His updated medication list for this problem includes:    Lisinopril 10 Mg Tabs (Lisinopril) ..... One by mouth qd  Complete Medication List: 1)  Lipitor 80 Mg Tabs (Atorvastatin calcium) .... Take 1 tablet by mouth once a day 2)  Metformin Hcl 1000 Mg Tabs (Metformin hcl) .... One by mouth bid 3)  Lantus Solostar 100 Unit/ml Soln (Insulin glargine) .... 30 units in am,  20 units pm 4)  Garlique 400 Mg Tbec (Garlic) .[name].. 1 once daily 5)  Multivitamins Tabs (Multiple vitamin) .... One by mouth once daily 6)  Bd Ultra-fine Pen Needles 29g X 12.85mm Misc (Insulin pen needle) .... Use three times a day as directed 7)  Cinnamon 500 Mg Caps (Cinnamon) .... Take 1 tablet by mouth two times a day  8)  Low-dose Aspirin 81 Mg Tabs (Aspirin) .... Take 1 tablet by mouth once a day 9)  Lisinopril 10 Mg Tabs (Lisinopril) .... One by mouth qd 10)  Apidra Solostar 100 Unit/ml Soln (Insulin glulisine) .... Use 10-20 units before evening meal 11)  Accu-chek Aviva Strp (Glucose blood) .... For three times a day testing 12)  Fish Oil 1000 Mg Caps (Omega-3 fatty acids) .... Take 1 tablet by mouth once a day 13)  Omeprazole 20 Mg Cpdr (Omeprazole) .... One by mouth once daily 14)  Cialis 20 Mg Tabs (Tadalafil) .... One by mouth once daily as needed 15)  Tamsulosin Hcl 0.4 Mg Caps (Tamsulosin hcl) .... One by mouth qhs 16)  Hydrocodone-acetaminophen 5-500 Mg Tabs (Hydrocodone-acetaminophen)  .... One tablet by mouth every 4 hours as needed pain 17)  Darvocet-n 100 100-650 Mg Tabs (Propoxyphene n-apap) .[name].. 1 tablet every 4 hours by mouth as needed pain 18)  Valtrex 500 Mg Tabs (Valacyclovir hcl) .... 2 pills by mouth three times a day x 7 days 19)  Lyrica 100 Mg Caps (Pregabalin) .... One by mouth three times a day 20)  Oxycodone-acetaminophen 5-325 Mg Tabs (Oxycodone-acetaminophen) .... One by mouth every 6 hours as needed for severe breakthrough pain 21)  Prednisolone Acetate 1 % Susp (Prednisolone acetate) .... One drop into right eye four times a day 22)  Timolol Maleate 0.5 % Solg (Timolol maleate) .... One drop right eye two times a day 23)  Lidocaine Hcl 2 % Gel (Lidocaine hcl) .... Apply small amout two times a day as needed  Patient Instructions: 1)  Take the Lyrica every 8 hours.  If you have severe breakthrough pain you may take one percocet every 6 hours as needed.    We will refer you to neurology and they will contact you with an appointment.   Call us if your symptoms don't improve.  F/u 11/19 as scheduled.  Prescriptions: OXYCODONE-ACETAMINOPHEN 5-325 MG TABS (OXYCODONE-ACETAMINOPHEN) one by mouth every 6 hours as needed for severe breakthrough pain  #30 x 0   Entered and Authorized by:   Lemont Fillers FNP   Signed by:   Lemont Fillers FNP on 01/13/2009   Method used:   Print then Give to Patient   RxID:   [id number] LYRICA 100 MG CAPS (PREGABALIN) one by mouth three times a day  #90 x 0   Entered and Authorized by:   Lemont Fillers FNP   Signed by:   Lemont Fillers FNP on 01/13/2009   Method used:   Print then Give to Patient   RxID:   [id number]   Current Allergies (reviewed today): No known allergies

## 2010-04-20 NOTE — Miscellaneous (Signed)
Summary: [facility]  [facility]   Imported By: Lanelle Bal 06/06/2009 10:25:10  _____________________________________________________________________  External Attachment:    Type:   Image     Comment:   External Document

## 2010-04-20 NOTE — Assessment & Plan Note (Signed)
Summary: SWOLLEN & BLOOD SHOT EYE   Vital Signs:  Patient Profile:   65 Years Old Male Height:     75 inches Weight:      239.50 pounds BMI:     30.04 Temp:     97.7 degrees F oral Pulse rate:   80 / minute Pulse rhythm:   regular Resp:     20 per minute BP sitting:   141 / 87  (left arm) Cuff size:   regular  Vitals Entered By: Glendell Docker CMA (November 18, 2007 2:09 PM)                 Chief Complaint:  Evaluation of left eye.  History of Present Illness: 65 y/o white male with PMhx of Diabetes and hypertension for follow up.  Pt reports working hard fixing up a house in [facility].   His wife describes episode of severe diaphoresis and transient confusion.  Wife gave him something to eat and checked CBG 30 min later and CBG was 120's.  His symptoms resolved.   Pt also reports left eye swelling and redness.  His eye is much better today.   No change in vision or severe pain.  Pt has appt with eye doctor tomorrow.  He is planning to ultimately retire with his wife to is home in [place].    Current Allergies (reviewed today): No known allergies   Past Medical History:    Coronary artery disease    GERD    Hyperlipidemia    Hypertension    Diabetes mellitus, type II    Erectile dysfunction   Past Surgical History:    Coronary artery bypass graft    Rotator cuff repair    Social History:    Never Smoked    Alcohol use-yes    Married    Occupation: truck driver         Review of Systems      See HPI       All other systems negative.    Physical Exam  General:     alert, well-developed, and well-nourished.   Head:     normocephalic and atraumatic.   Eyes:     vision grossly intact, pupils equal, pupils round, and pupils reactive to light.  mild injection of left conjunctiva Mouth:     Oral mucosa and oropharynx without lesions or exudates.   Neck:     supple and no carotid bruits.   Lungs:     normal respiratory effort and normal breath  sounds.   Heart:     normal rate, regular rhythm, and no gallop.   Abdomen:     soft and non-tender.   Psych:     normally interactive, good eye contact, not anxious appearing, and not depressed appearing.    Diabetes Management Exam:    Foot Exam (with socks and/or shoes not present):       Sensory-Monofilament:          Left foot: diminished          Right foot: diminished       Inspection:          Left foot: normal          Right foot: normal       Nails:          Left foot: normal          Right foot: normal    Impression & Recommendations:  Problem #  1:  CONJUNCTIVITIS (ICD-372.30) Pt reports left eye redness and swelling.  His eye looks much better today.  He has f/u with Dr. Dione Booze in AM.    Problem # 2:  DIABETES MELLITUS, TYPE II, UNCONTROLLED (ICD-250.02) Pt has been working hard fixing up a house in [facility].   His wife describes episode of severe diaphoresis and transient confusion.  Wife gave him something to eat and checked CBG 30 min later and CBG was 120's.  I suspect he had significant hypoglycemia.  I stressed importance of checking his blood sugar more frequently with strenuous physical activity.  He was also advised to decrease his lantus to 15 units two times a day when he plans on strenuous activity.  Pt having trouble using his current meter.  Free style freedom lite meter provided with instruction.  His updated medication list for this problem includes:    Metformin Hcl 1000 Mg Tabs (Metformin hcl) ..... One by mouth bid    Lantus Solostar 100 Unit/ml Soln (Insulin glargine) .Marland KitchenMarland KitchenMarland KitchenMarland Kitchen 30 u two times a day    Low-dose Aspirin 81 Mg Tabs (Aspirin) .[name]... Take 1 tablet by mouth once a day    Lisinopril 10 Mg Tabs (Lisinopril) ..... One by mouth qd    Novolog Flexpen 100 Unit/ml Soln (Insulin aspart) .Marland KitchenMarland KitchenMarland KitchenMarland Kitchen 10-15 units before dinner  Orders: TLB-AST (SGOT) (84450-SGOT) TLB-ALT (SGPT) (84460-ALT) TLB-BMP (Basic Metabolic Panel-BMET) (80048-METABOL) TLB-Lipid Panel  (80061-LIPID) TLB-Microalbumin/Creat Ratio, Urine (82043-MALB) TLB-TSH (Thyroid Stimulating Hormone) (84443-TSH)   Problem # 3:  HYPERTENSION (ICD-401.9) BP slightly higher today.  Home BP readings are better.  Maintain current medication regimen.  His updated medication list for this problem includes:    Lisinopril 10 Mg Tabs (Lisinopril) ..... One by mouth qd  Orders: TLB-AST (SGOT) (84450-SGOT) TLB-ALT (SGPT) (84460-ALT) TLB-BMP (Basic Metabolic Panel-BMET) (80048-METABOL) TLB-Lipid Panel (80061-LIPID) TLB-Microalbumin/Creat Ratio, Urine (82043-MALB) TLB-TSH (Thyroid Stimulating Hormone) (84443-TSH)  BP today: 141/87 Prior BP: 130/79 (08/18/2007)  Labs Reviewed: Creat: 0.9 (05/27/2007) Chol: 169 (02/10/2007)   HDL: 54.9 (02/10/2007)   LDL: 103 (02/10/2007)   TG: 54 (02/10/2007)   Complete Medication List: 1)  Lipitor 80 Mg Tabs (Atorvastatin calcium) .... Take 1 tablet by mouth once a day 2)  Metformin Hcl 1000 Mg Tabs (Metformin hcl) .... One by mouth bid 3)  Lantus Solostar 100 Unit/ml Soln (Insulin glargine) .... 30 u two times a day 4)  Garlique 400 Mg Tbec (Garlic) .[name].. 1 once daily 5)  Multivitamins Tabs (Multiple vitamin) .... One by mouth once daily 6)  Bd Ultra-fine Pen Needles 29g X 12.82mm Misc (Insulin pen needle) .... Test blood sugar three times a day 7)  Cinnamon 500 Mg Caps (Cinnamon) .... Take 1 tablet by mouth two times a day 8)  Low-dose Aspirin 81 Mg Tabs (Aspirin) .... Take 1 tablet by mouth once a day 9)  Lisinopril 10 Mg Tabs (Lisinopril) .... One by mouth qd 10)  Novolog Flexpen 100 Unit/ml Soln (Insulin aspart) .[name].. 10-15 units before dinner 11)  Freestyle Lite Strp (Glucose blood) .... For three times a day testing   Patient Instructions: 1)  Please schedule a follow-up appointment in 3 months.   ] Current Allergies (reviewed today): No known allergies

## 2010-04-20 NOTE — Letter (Signed)
Summary: [facility]  [facility]   Imported By: Lanelle Bal 05/17/2009 11:23:51  _____________________________________________________________________  External Attachment:    Type:   Image     Comment:   External Document

## 2010-04-20 NOTE — Progress Notes (Signed)
Summary: Diabetic Testing Supplies  Phone Note Outgoing Call   Call placed by: Glendell Docker CMA,  April 04, 2010 11:18 AM Call placed to: Patient Summary of Call: call placed to patient at [phone], no answer. A voice message was left for patinet to return call. Received a request for diabetic testing supplies from [facility]. Dr Artist Pais would like to know if patient is still using Accu Chek meter. Initial call taken by: Glendell Docker CMA,  April 04, 2010 11:19 AM  Follow-up for Phone Call        call placed to patient at [phone], no answer. A detailed voice message was left for patiet to return call Follow-up by: Glendell Docker CMA,  April 05, 2010 12:06 PM  Additional Follow-up for Phone Call Additional follow up Details #1::        patients wife returned phone call stating they were out of town, and they could now be reached at home. Additional Follow-up by: Glendell Docker CMA,  April 06, 2010 5:30 PM    Additional Follow-up for Phone Call Additional follow up Details #2::    call placed to patient at [phone], his wife states patient is still using the Accu-Chek meter. She stated they received a phone call from [facility] regarding patients diabetic testing supplies.  Follow-up by: Glendell Docker CMA,  April 07, 2010 9:30 AM  Additional Follow-up for Phone Call Additional follow up Details #3:: Details for Additional Follow-up Action Taken: I advise pt use accu chek meter.   ok to send refill to local pharm  D. Thomos Lemons DO,  April 10, 2010 5:09 PM  Pt advised per Dr Olegario Messier recommendation. Pt states he wants to use [facility] for his diabetic testing supplies and will discuss with Dr Artist Pais at his appt. today. Nicki Guadalajara Fergerson CMA (AAMA)  April 11, 2010 8:40 AM

## 2010-04-20 NOTE — Letter (Signed)
Summary: [facility]  [facility]   Imported By: Lanelle Bal 10/04/2009 09:04:04  _____________________________________________________________________  External Attachment:    Type:   Image     Comment:   External Document

## 2010-04-20 NOTE — Letter (Signed)
Summary: [facility] for Diabetes Supplies/[facility]  [facility] Diabetes Supplies/[facility]   Imported By: Lanelle Bal 07/05/2008 12:02:57  _____________________________________________________________________  External Attachment:    Type:   Image     Comment:   External Document

## 2010-04-20 NOTE — Miscellaneous (Signed)
Summary: Appointment Canceled  Appointment status changed to canceled by [employer] on 02/27/2010 11:01 AM.  Cancellation Comments --------------------- r/s myoview wt 244.8/dx 794.30/medicare/bcbs  Appointment Information ----------------------- Appt Type:  CARDIOLOGY NUCLEAR TESTING      Date:  Tuesday, February 28, 2010      Time:  11:30 AM for 15 min   Urgency:  Routine   Made By:  Pearson Grippe  To Visit:  [id number]-[id number]    Reason:  r/s myoview wt 244.8/dx 794.30/medicare/bcbs  Appt Comments ------------- -- 02/27/10 11:01: ([facility]) CANCELED -- r/s myoview wt 244.8/dx 794.30/medicare/bcbs -- 02/10/10 10:08: ([facility]) BOOKED -- Routine CARDIOLOGY NUCLEAR TESTING at 02/28/2010 11:30 AM for 15 min r/s myoview wt 244.8/dx 794.30/medicare/bcbs -- 02/07/10 14:23

## 2010-04-20 NOTE — Miscellaneous (Signed)
Summary: diabetic eye exam  Clinical Lists Changes  Observations: Added new observation of DMEYEEXAMNXT: 08/2010 (08/17/2009 9:39) Added new observation of DMEYEEXMRES: normal (08/12/2009 9:42) Added new observation of EYE EXAM BY: Ernesto Rutherford, MD [phone] (08/12/2009 9:42) Added new observation of DIAB EYE EX: normal (08/12/2009 9:42)        Diabetes Management Exam:    Eye Exam:       Eye Exam done elsewhere          Date: 08/12/2009          Results: normal          Done by: Ernesto Rutherford, MD [phone]

## 2010-04-20 NOTE — Consult Note (Signed)
Summary: [facility]  [facility]   Imported By: Lanelle Bal 11/28/2007 12:47:18  _____________________________________________________________________  External Attachment:    Type:   Image     Comment:   External Document

## 2010-04-20 NOTE — Progress Notes (Signed)
  Phone Note Outgoing Call   Call placed by: Deliah Goody, RN,  September 23, 2009 4:41 PM Summary of Call: report from cardiac rehab shows PVC's. per dr Jens Som pt to start metoprolol. pt to call with any problems Deliah Goody, RN  September 23, 2009 4:43 PM     New/Updated Medications: METOPROLOL SUCCINATE 25 MG XR24H-TAB (METOPROLOL SUCCINATE) Take one tablet by mouth daily Prescriptions: METOPROLOL SUCCINATE 25 MG XR24H-TAB (METOPROLOL SUCCINATE) Take one tablet by mouth daily  #30 x 12   Entered by:   Deliah Goody, RN   Authorized by:   Ferman Hamming, MD, [facility]   Signed by:   Deliah Goody, RN on 09/23/2009   Method used:   Electronically to        [id number] (retail)       [address]       [place], [place]  [address]       Ph: [phone]       Fax: [phone]   RxID:   [id number]

## 2010-04-20 NOTE — Assessment & Plan Note (Signed)
Summary: 1 month follow up in HP//jwr   Visit Type:  Follow-up Copy to:  pcp Primary Provider/Referring Provider:  Dondra Spry DO  CC:  Pt here for follow up.  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 [name] reports loud snoring especially on his back & witnessed apneas & non refreshing sleep. Epworth Sleepiness Score is 21/24 as reported by wife. Bedtime around midnight, latency about 30 mins(chronic) , sleeps on his back with 2-3 pillows, 2-4 waakenings incl noctuira, wakes up at 0800 tired. Naps x 1 hr daily in bed, drinks coffee x 2 cups in am & decaffeinated tea. There is no history suggestive of cataplexy, sleep paralysis or parasomnias  PSG 6/27 /11  - wt 246 lbs -showed severe obstructive sleep apnea with AHI 50/h & desaturation to 87%, corrected by CPAP 10 cm - he did not tolerate a fullf ace mask well due to shingles . This has improved but still has neuropathic pain. He reports dyspne aon exertion, walking up hills or stairs. Recent stents placed - evaluation by dr Janeann Forehand, echo 3/11 showed RVSP 25, nml LV funtion. He has been on 20 mg lisnorpil , will NOT take beta blocker, denies cough or throat clearing.  CXR 08/12/09 nml  December 13, 2009 12:01 PM  download 8/23-9/26 >> good compliance, avg pr 10 cm - residual AHI 15/h, central 4/h, leak ++ He still has awakenings but somewhat better rested. No dryness, discussed humidifier settings  Preventive Screening-Counseling & Management  Alcohol-Tobacco     Smoking Status: never  Current Medications (verified): 1)  Lipitor 80 Mg Tabs (Atorvastatin Calcium) .... Take One Tablet By Mouth Daily. 2)  Metformin Hcl 1000 Mg  Tabs (Metformin Hcl) .... One By Mouth Bid 3)  Lantus Solostar 100 Unit/ml  Soln (Insulin Glargine) .... 20 Units in Am and 10 Units in Pm Subcutaneously 4)  Garlique 400 Mg  Tbec (Garlic) .[name].. 1 Once  Daily 5)  Bd Ultra-Fine Pen Needles 29g X 12.41mm  Misc (Insulin Pen Needle) .... Use Three Times A Day As Directed 6)  Aspirin Ec 325 Mg Tbec (Aspirin) .... Take One Tablet By Mouth Daily 7)  Lisinopril 20 Mg Tabs (Lisinopril) .... One By Mouth Once Daily 8)  Apidra Solostar 100 Unit/ml Soln (Insulin Glulisine) .... Use 10 - 15 Units Subcutaneously   Three Times A Day Before Meals 9)  Accu-Chek Aviva  Strp (Glucose Blood) .... For Three Times A Day Testing 10)  Omeprazole 20 Mg Cpdr (Omeprazole) .... One By Mouth Once Daily 11)  Miralax  Powd (Polyethylene Glycol 3350) .Marland KitchenMarland KitchenMarland Kitchen 17 Grams Mixed With Water Two Times A Day 12)  Finasteride 5 Mg Tabs (Finasteride) .... One By Mouth Once Daily 13)  Effient 10 Mg Tabs (Prasugrel Hcl) .... Take 1 Tablet By Mouth Once A Day 14)  Daily Multi  Tabs (Multiple Vitamins-Minerals) .... Take 1 Tablet By Mouth Once A Day 15)  Bupropion Hcl 150 Mg Xr24h-Tab (Bupropion Hcl) .... One By Mouth Once Daily  Allergies (verified): No Known Drug Allergies  Past History:  Past Medical History: Last updated: 09/14/2009 HYPERTENSION (ICD-401.9) HYPERLIPIDEMIA (ICD-272.4) CORONARY ARTERY DISEASE (ICD-414.00)  OSTEOARTHRITIS (ICD-715.90) POSTHERPETIC NEURALGIA (ICD-053.19) HERPES ZOSTER (ICD-053.9) ADJUSTMENT DISORDER WITH MIXED FEATURES (ICD-309.28) DIABETIC PERIPHERAL NEUROPATHY (ICD-250.60) ERECTILE DYSFUNCTION (ICD-302.72) DIABETES MELLITUS, TYPE II, UNCONTROLLED (ICD-250.02) OBESITY (ICD-278.00) GERD (ICD-530.81)   Social History: Last updated: 11/01/2009 Never Smoked Alcohol use-yes  Married Occupation: truck Hospital doctor, retired             Review of Systems  The patient denies anorexia, fever, weight loss, weight gain, vision loss, decreased hearing, hoarseness, chest pain, syncope, dyspnea on exertion, peripheral edema, prolonged cough, headaches, hemoptysis, abdominal pain, melena, hematochezia, severe indigestion/heartburn, hematuria, muscle  weakness, suspicious skin lesions, difficulty walking, depression, unusual weight change, abnormal bleeding, enlarged lymph nodes, and angioedema.    Vital Signs:  Patient profile:   65 year old male Height:      75 inches Weight:      246.5 pounds BMI:     30.92 O2 Sat:      98 % on Room air Temp:     97.9 degrees F oral Pulse rate:   92 / minute BP sitting:   110 / 72  (left arm) Cuff size:   large  Vitals Entered By: Zackery Barefoot CMA (December 13, 2009 11:54 AM)  O2 Flow:  Room air CC: Pt here for follow up Comments Medications reviewed with patient Verified contact number and pharmacy with patient Zackery Barefoot [facility]  December 13, 2009 11:54 AM    Physical Exam  Additional Exam:  wt 246 December 14, 2009  Gen. Pleasant, well-nourished, in no distress, normal affect ENT - no lesions, no post nasal drip, class 2 airway Neck: No JVD, no thyromegaly, no carotid bruits Lungs: no use of accessory muscles, no dullness to percussion, clear without rales or rhonchi  Cardiovascular: Rhythm regular, heart sounds  normal, no murmurs or gallops, no peripheral edema Musculoskeletal: No deformities, no cyanosis or clubbing      Impression & Recommendations:  Problem # 1:  SLEEP APNEA, OBSTRUCTIVE (ICD-327.23)  Compliance encouraged, wt loss emphasized, asked to avoid meds with sedative side effects, cautioned against driving when sleepy.  ct CPAP 10 cm, obtain better mask fit.  Orders: Est. Patient Level III (04540) DME Referral (DME)  Patient Instructions: 1)  Copy sent to: 2)  Please schedule a follow-up appointment in 4 months.    Appended Document: 1 month follow up in HP//jwr let him know - pressure good, events reduced by machine, some leak from around his nose prongs, no changes to pressure  Appended Document: 1 month follow up in HP//jwr LMOMTCB X 1. JWR  Appended Document: 1 month follow up in HP//jwr Pt returned call to [facility].  He was informed of  above results per RA and verbalized understanding.  Appended Document: 1 month follow up in HP//jwr pl fax note to hometown [facility]  Appended Document: 1 month follow up in HP//jwr OV note faxed to [facility].

## 2010-04-20 NOTE — Miscellaneous (Signed)
Summary: [facility]   [facility]   Imported By: Roderic Ovens 12/27/2009 15:40:16  _____________________________________________________________________  External Attachment:    Type:   Image     Comment:   External Document

## 2010-04-20 NOTE — Progress Notes (Signed)
Summary: DVD received from [facility]  DVD of CT chest and CT Abdomen & Pelvis from 01/22/10. Placed in black box on top shelf. Luis Yu  February 16, 2010 4:56 PM  Appended Document:  LMOMTCB X 1

## 2010-04-20 NOTE — Progress Notes (Signed)
Summary: Apidra Refill  Phone Note Refill Request Message from:  Fax from Pharmacy on November 22, 2009 9:18 AM  Refills Requested: Medication #1:  APIDRA SOLOSTAR 100 UNIT/ML SOLN use 10 - 15 units subcutaneously   three times a day before meals   Dosage confirmed as above?Dosage Confirmed   Brand Name Necessary? No   Supply Requested: 3 months last fill not shown on fax    Method Requested: Electronic Next Appointment Scheduled: 11-28-09 3pm Dr Artist Pais  Initial call taken by: Roselle Locus,  November 22, 2009 9:20 AM    Prescriptions: APIDRA SOLOSTAR 100 UNIT/ML SOLN (INSULIN GLULISINE) use 10 - 15 units subcutaneously   three times a day before meals  #90 days x 1   Entered by:   Glendell Docker CMA   Authorized by:   D. Thomos Lemons DO   Signed by:   Glendell Docker CMA on 11/22/2009   Method used:   Electronically to        [facility] [address] (retail)       [address]       [place], [place]  [address]       Ph: [phone]       Fax: [phone]   RxID:   [id number]

## 2010-04-20 NOTE — Assessment & Plan Note (Signed)
Summary: FU/NWS  Medications Added LIPITOR 80 MG  TABS (ATORVASTATIN CALCIUM) Take 1 tablet by mouth once a day METFORMIN HCL 1000 MG  TABS (METFORMIN HCL) one by mouth bid BD ULTRA-FINE PEN NEEDLES 29G X 12.7MM  MISC (INSULIN PEN NEEDLE) test blood sugar three times a day CINNAMON 500 MG  CAPS (CINNAMON) Take 1 tablet by mouth two times a day LOW-DOSE ASPIRIN 81 MG  TABS (ASPIRIN) Take 1 tablet by mouth once a day LISINOPRIL 10 MG  TABS (LISINOPRIL) one by mouth qd      Allergies Added: NKDA  Vital Signs:  Patient Profile:   65 Years Old Male Height:     75 inches Weight:      240.13 pounds BMI:     30.12 Temp:     97.1 degrees F oral Pulse rate:   81 / minute BP sitting:   140 / 91  (left arm)  Vitals Entered By: Glendell Docker (March 18, 2007 10:19 AM)                 Chief Complaint:  Disease management follow up and Type 2 diabetes mellitus follow-up.  History of Present Illness:  Type 2 Diabetes Mellitus Follow-Up      This is a 65 year old man who presents for Type 2 diabetes mellitus follow-up.  The patient denies self managed hypoglycemia and weight gain.  The patient denies the following symptoms: chest pain.  Since the last visit the patient reports good dietary compliance.  The patient has been measuring capillary blood glucose before breakfast.  Since the last visit, the patient reports having had eye care by an ophthalmologist.    Current Allergies (reviewed today): No known allergies   Past Medical History:    Reviewed history from 10/09/2006 and no changes required:       Coronary artery disease       GERD       Hyperlipidemia       Hypertension       Diabetes mellitus, type II       Erectile dysfunction   Family History:    Mother died of coronary artery disease at age 20    Father is alive at age 55 with coronary artery disease    Half-brother who has a history of stroke  Social History:    Never Smoked    Alcohol use-yes    Married   Occupation: truck Hospital doctor   Risk Factors:  Tobacco use:  never Alcohol use:  yes   Review of Systems      See HPI   Physical Exam  General:     alert, well-developed, and well-nourished.   Neck:     No deformities, masses, or tenderness noted.no carotid bruits.   Lungs:     Normal respiratory effort, chest expands symmetrically. Lungs are clear to auscultation, no crackles or wheezes. Heart:     normal rate, regular rhythm, and no murmur.   Extremities:     No lower extremity edema  Psych:     Cognition and judgment appear intact. Alert and cooperative with normal attention span and concentration.     Impression & Recommendations:  Problem # 1:  DIABETES MELLITUS, TYPE II, UNCONTROLLED (ICD-250.02) Patient reports consultation with diabetic educator.  He has made an earnest effort to change his diet.  His blood sugars are between 110 and 150 in the morning. A1c and microalbumin to creatinine ratio before next follow-up visit. Last diabetic eye exam  July of 2008 ( mild diabetic retinopathy both eyes )  His updated medication list for this problem includes:    Metformin Hcl 1000 Mg Tabs (Metformin hcl) ..... One by mouth bid    Lantus Solostar 100 Unit/ml Soln (Insulin glargine) .Marland KitchenMarland KitchenMarland KitchenMarland Kitchen 30 u two times a day    Low-dose Aspirin 81 Mg Tabs (Aspirin) .[name]... Take 1 tablet by mouth once a day    Lisinopril 10 Mg Tabs (Lisinopril) ..... One by mouth qd  Labs Reviewed: HgBA1c: 10.0 (02/10/2007)   Creat: 0.9 (02/10/2007)      Problem # 2:  HYPERTENSION (ICD-401.9) Assessment: Deteriorated Start ACE inhibitor.  Monitor BMET.  His updated medication list for this problem includes:    Lisinopril 10 Mg Tabs (Lisinopril) ..... One by mouth qd  BP today: 140/91 Prior BP: 147/88 (02/10/2007)  Labs Reviewed: Creat: 0.9 (02/10/2007) Chol: 169 (02/10/2007)   HDL: 54.9 (02/10/2007)   LDL: 103 (02/10/2007)   TG: 54 (02/10/2007)   Problem # 3:  HYPERLIPIDEMIA (ICD-272.4)  Encouraged compliance with Lipitor.  Samples provided. Monitor LFTs and fasting lipid profile. His updated medication list for this problem includes:    Lipitor 80 Mg Tabs (Atorvastatin calcium) .[name]... Take 1 tablet by mouth once a day  Labs Reviewed: Chol: 169 (02/10/2007)   HDL: 54.9 (02/10/2007)   LDL: 103 (02/10/2007)   TG: 54 (02/10/2007) SGOT: 19 (02/10/2007)   SGPT: 24 (02/10/2007)   Complete Medication List: 1)  Lipitor 80 Mg Tabs (Atorvastatin calcium) .... Take 1 tablet by mouth once a day 2)  Metformin Hcl 1000 Mg Tabs (Metformin hcl) .... One by mouth bid 3)  Lantus Solostar 100 Unit/ml Soln (Insulin glargine) .... 30 u two times a day 4)  Garlique 400 Mg Tbec (Garlic) .[name].. 1 once daily 5)  Multivitamins Tabs (Multiple vitamin) .... One by mouth once daily 6)  Bd Ultra-fine Pen Needles 29g X 12.35mm Misc (Insulin pen needle) .... Test blood sugar three times a day 7)  Cinnamon 500 Mg Caps (Cinnamon) .... Take 1 tablet by mouth two times a day 8)  Low-dose Aspirin 81 Mg Tabs (Aspirin) .... Take 1 tablet by mouth once a day 9)  Lisinopril 10 Mg Tabs (Lisinopril) .... One by mouth qd   Patient Instructions: 1)  Please schedule a follow-up appointment in 2 months. 2)  Please return for lab work one (1) week before your next appointment.  3)  BMP prior to visit, ICD-9:  401.9 4)  HbgA1C prior to visit, ICD-9:  250.02 5)  Urine Microalbumin prior to visit, ICD-9:  250.02     Prescriptions: BD ULTRA-FINE PEN NEEDLES 29G X 12.7MM  MISC (INSULIN PEN NEEDLE) test blood sugar three times a day  #300 x 3   Entered and Authorized by:   Dondra Spry DO   Signed by:   Dondra Spry DO on 03/18/2007   Method used:   Print then Give to Patient   RxID:   [id number] LANTUS SOLOSTAR 100 UNIT/ML  SOLN (INSULIN GLARGINE) 30 U two times a day  #3 boxes x 3   Entered and Authorized by:   Dondra Spry DO   Signed by:   Dondra Spry DO on 03/18/2007   Method used:   Print then Give to  Patient   RxID:   [id number] LISINOPRIL 10 MG  TABS (LISINOPRIL) one by mouth qd  #30 x 3   Entered and Authorized by:   Dondra Spry DO  Signed by:   Dondra Spry DO on 03/18/2007   Method used:   Electronically sent to ...       [facility] [phone][facility]       [address]       [place], [place]  [address]       Ph: [phone] or [phone]       Fax: [phone]   RxID:   [id number] METFORMIN HCL 1000 MG  TABS (METFORMIN HCL) one by mouth bid  #180 x 3   Entered and Authorized by:   Dondra Spry DO   Signed by:   Dondra Spry DO on 03/18/2007   Method used:   Print then Give to Patient   RxID:   [id number] LIPITOR 80 MG  TABS (ATORVASTATIN CALCIUM) Take 1 tablet by mouth once a day  #90 x 3   Entered and Authorized by:   Dondra Spry DO   Signed by:   Dondra Spry DO on 03/18/2007   Method used:   Print then Give to Patient   RxID:   [id number] BD ULTRA-FINE PEN NEEDLES 29G X 12.7MM  MISC (INSULIN PEN NEEDLE) test blood sugar three times a day  #300 x 3   Entered by:   Glendell Docker   Authorized by:   Dondra Spry DO   Signed by:   Glendell Docker on 03/18/2007   Method used:   Print then Give to Patient   RxID:   [id number] LIPITOR 80 MG  TABS (ATORVASTATIN CALCIUM) Take 1 tablet by mouth once a day  #90 x 3   Entered by:   Glendell Docker   Authorized by:   Dondra Spry DO   Signed by:   Glendell Docker on 03/18/2007   Method used:   Print then Give to Patient   RxID:   [id number]  ]

## 2010-04-20 NOTE — Assessment & Plan Note (Signed)
Summary: Diabetic with Hurt toe- JR   Vital Signs:  Patient profile:   65 year old male Height:      75 inches Weight:      248.25 pounds BMI:     31.14 Temp:     98.0 degrees F oral Pulse rate:   80 / minute Pulse rhythm:   regular Resp:     18 per minute BP sitting:   140 / 80  (left arm) Cuff size:   large  Vitals Entered By: Glendell Docker CMA (September 22, 2008 10:58 AM) CC: Right toe injury   Primary Care Provider:  Dondra Spry DO  CC:  Right toe injury.  History of Present Illness: 65 y/o male with hx of poorly controlled DM II c/o right toe injury.  He states he hit his toe on something and the nail pulled back 1 week agol. Pain to touch is severe. Patient states 2 nights ago he got it hung on his sheets aggravating pain.  Some redness.  No fever or chills.  Allergies: No Known Drug Allergies  Past History:  Past Medical History: Coronary artery disease GERD  Hyperlipidemia Hypertension  Diabetes mellitus, type II Erectile dysfunction    Past Surgical History: Coronary artery bypass graft Rotator cuff repair     Family History: Mother died of coronary artery disease at age 98 Father is alive at age 3 with coronary artery disease Half-brother who has a history of stroke     Social History: Never Smoked Alcohol use-yes Married Occupation: truck Biomedical scientist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   Neck:  supple and no carotid bruits.   Lungs:  normal respiratory effort and normal breath sounds.   Heart:  normal rate, regular rhythm, and no gallop.   Skin:  partially avulsed right great toe nail.   Crusted blood.  No active bleeding.   right toe tender and slightly red.   Impression & Recommendations:  Problem # 1:  OPEN WOUND TOE WITHOUT MENTION COMPLICATION (ICD-893.0) Assessment New 65 y/o diabetic with right toe trauma and partial toe nail avulsion.     R/O fracture.   Keflex to help avoid infection.    Refer  to podiatry for further  treatment.  Orders: T-Foot Right (73630TC) Podiatry Referral (Podiatry)  Complete Medication List: 1)  Lipitor 80 Mg Tabs (Atorvastatin calcium) .... Take 1 tablet by mouth once a day 2)  Metformin Hcl 1000 Mg Tabs (Metformin hcl) .... One by mouth bid 3)  Lantus Solostar 100 Unit/ml Soln (Insulin glargine) .... 30 units in am,  20 units pm 4)  Garlique 400 Mg Tbec (Garlic) .[name].. 1 once daily 5)  Multivitamins Tabs (Multiple vitamin) .... One by mouth once daily 6)  Bd Ultra-fine Pen Needles 29g X 12.61mm Misc (Insulin pen needle) .... Test blood sugar three times a day 7)  Cinnamon 500 Mg Caps (Cinnamon) .... Take 1 tablet by mouth two times a day 8)  Low-dose Aspirin 81 Mg Tabs (Aspirin) .... Take 1 tablet by mouth once a day 9)  Lisinopril 10 Mg Tabs (Lisinopril) .... One by mouth qd 10)  Novolog Flexpen 100 Unit/ml Soln (Insulin aspart) .[name].. 10-15 units before dinner 11)  Accu-chek Aviva Strp (Glucose blood) .... For three times a day testing 12)  Benefiber Tabs (Wheat dextrin) .... Take 1 tablet by mouth once a day 13)  Fish Oil 1000 Mg Caps (Omega-3 fatty acids) .... Take  1 tablet by mouth once a day 14)  Omeprazole 20 Mg Cpdr (Omeprazole) .... One by mouth once daily 15)  Gabapentin 300 Mg Caps (Gabapentin) .... One by mouth at bedtime prn 16)  Cialis 20 Mg Tabs (Tadalafil) .... One by mouth once daily as needed 17)  Cephalexin 500 Mg Caps (Cephalexin) .... 2 caps by mouth bid  Patient Instructions: 1)  Our office will contact you re:  referral to podiatrist. 2)  Call our office if your symptoms do not  improve or gets worse. Prescriptions: CEPHALEXIN 500 MG CAPS (CEPHALEXIN) 2 caps by mouth bid  #28 x 0   Entered and Authorized by:   D. Thomos Lemons DO   Signed by:   D. Thomos Lemons DO on 09/22/2008   Method used:   Print then Give to Patient   RxID:   [id number]

## 2010-04-20 NOTE — Progress Notes (Signed)
Summary: chart coming from heartcare told pt to rsc to Tuesday   Phone Note Call from Patient Call back at Home Phone [phone]   Caller: PT WIFE LIVE Call For: Gera Inboden  Complaint: Urinary/GYN Problems Summary of Call: HE IS DUE TO HAVE AN MRI TOMORROW AT 9:30 AM  [facility] WANTS TO KNOW WHAT TYPE OF STINTS HE HAS.  [facility] IS WAITING FOR HER TO CALL WITH THIS INFORMATION SO THEY KNOW WHAT MACHINE TO SCHEDULE FOR HIM FOR TOMORROW.  Initial call taken by: Roselle Locus,  January 27, 2009 10:30 AM  Follow-up for Phone Call        call Dr. Rosalyn Charters office to find out what type of stent pt has Follow-up by: D. Thomos Lemons DO,  January 27, 2009 11:12 AM  Additional Follow-up for Phone Call Additional follow up Details #1::        Dr Rosalyn Charters office faxed a copy of his cardiac caths from e chart.  Put them on your desk to see if they list the type of stent Roselle Locus  January 27, 2009 1:28 PM    Additional Follow-up for Phone Call Additional follow up Details #2::    patient is  to have his scan in the am @ 9 and needs to let them know before 5 today what type of stents he has Roselle Locus  January 27, 2009 4:03 PM Called medical records at [facility] they will send the chart to Dr Artist Pais.  Called patient and advised he should reschedule on Tuesday Roselle Locus  January 27, 2009 4:50 PM  Additional Follow-up for Phone Call Additional follow up Details #3:: Details for Additional Follow-up Action Taken: call pt - does he have a card given to him by cardiologist describing heart stent.  the medical records I have scanned so far does note stent placement  spoke with patient he states he was not given a card by his cardiologist Glendell Docker CMA  January 31, 2009 5:34 PM   I reviewed pt's paper chart.  I could not find any information about stent.  Dondra Spry DO  February 03, 2009 5:25 PM  Additional Follow-up by: D. Thomos Lemons DO,  January 31, 2009 1:18 PM

## 2010-04-20 NOTE — Progress Notes (Signed)
Summary: lab order  ---- Converted from flag ---- ---- 04/07/2010 9:52 AM, D. Thomos Lemons DO wrote: BMET 250.02 A1c:  250.02 microalb / cr ratio:  250.02 FLP:  272.4 ------------------------------  Phone Note Other Incoming   Summary of Call: Order enterd and faxed to the lab. Nicki Guadalajara Fergerson CMA Duncan Dull)  April 07, 2010 9:56 AM

## 2010-04-20 NOTE — Assessment & Plan Note (Signed)
Summary: 3 month follow up/mhf   Vital Signs:  Patient profile:   65 year old male Height:      75 inches Weight:      252.75 pounds BMI:     31.71 O2 Sat:      96 % on Room air Temp:     98.2 degrees F oral Pulse rate:   98 / minute Pulse rhythm:   regular Resp:     20 per minute BP sitting:   142 / 80  (right arm) Cuff size:   large  Vitals Entered By: Glendell Docker CMA (Jul 27, 2009 10:40 AM)  O2 Flow:  Room air CC: Rm 3- 3 Month Folow up disease management, Headaches Is Patient Diabetic? Yes Did you bring your meter with you today? No   Primary Care Provider:  Dondra Spry DO  CC:  Rm 3- 3 Month Folow up disease management and Headaches.  History of Present Illness:  Headaches      This is a 65 year old man who presents with Headaches.  The patient denies nausea and vomiting.  The headache is described as intermittent.  The location of the pain is unilateral on the left.  The patient denies the following high-risk features: vision loss or change.  wife had noticed loud snoring  he has rash under left arm.  he was bitten by tick  poor sleep due to nocturia.  urine stream is weak  htn - stable  DM II - blood sugars still somewhat labile.  wife trying her best to remind pt to use insulin  Allergies (verified): No Known Drug Allergies  Past History:  Past Medical History: Current Problems:  HYPERTENSION (ICD-401.9) HYPERLIPIDEMIA (ICD-272.4)  CORONARY ARTERY DISEASE (ICD-414.00) OSTEOARTHRITIS (ICD-715.90) POSTHERPETIC NEURALGIA (ICD-053.19) HERPES ZOSTER (ICD-053.9) CONTACT DERMATITIS (ICD-692.9) ADJUSTMENT DISORDER WITH MIXED FEATURES (ICD-309.28) DIABETIC PERIPHERAL NEUROPATHY (ICD-250.60) ERECTILE DYSFUNCTION (ICD-302.72) DIABETES MELLITUS, TYPE II, UNCONTROLLED (ICD-250.02) OBESITY (ICD-278.00) GERD (ICD-530.81)   Past Surgical History: coronary artery bypass graft in 1996 with a LIMA to the LAD, saphenous vein graft to the acute marginal,  saphenous vein graft to the PDA and a saphenous vein graft to the circumflex. Right knee arthroscopic surgery in March of 2006 s/p bilateral knee replacement Shoulder surgery   Family History: Mother died of coronary artery disease at age 99 Father is alive at age 51 with coronary artery disease Half-brother who has a history of stroke             Social History: Never Smoked Alcohol use-yes   Married Occupation: truck Hospital doctor             Review of Systems  The patient denies vision loss, transient blindness, and difficulty walking.    Physical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   Eyes:  pupils equal, pupils round, and pupils reactive to light.   Mouth:  Oral mucosa and oropharynx without lesions or exudates.   Neck:  supple and no carotid bruits.   Lungs:  normal respiratory effort and normal breath sounds.   Heart:  normal rate, regular rhythm, and no gallop.   Extremities:  trace left pedal edema and trace right pedal edema.   Neurologic:  cranial nerves II-XII intact and gait normal.   Skin:  erythematous patch near left axilla Psych:  normally interactive and good eye contact.     Impression & Recommendations:  Problem # 1:  HEADACHE (ICD-784.0)  Pt c/o nocturnal headache.  symptoms are left sided.  I doubt related to shingles pain consider OSA arrange sleep study His updated medication list for this problem includes:    Low-dose Aspirin 81 Mg Tabs (Aspirin) .[name]... Take 1 tablet by mouth once a day  Orders: Sleep Disorder Referral (Sleep Disorder)  Problem # 2:  HYPERTENSION (ICD-401.9) Maintain current medication regimen.  His updated medication list for this problem includes:    Lisinopril 20 Mg Tabs (Lisinopril) ..... One by mouth once daily  BP today: 142/80 Prior BP: 146/84 (06/08/2009)  Labs Reviewed: K+: 4.1 (05/17/2009) Creat: : 0.84 (05/17/2009)   Chol: 132 (08/24/2008)   HDL: 54.20 (08/24/2008)   LDL: 69 (08/24/2008)    TG: 46.0 (08/24/2008)  Problem # 3:  TICK BITE (ICD-E906.4) tx with doxy  Problem # 4:  BENIGN PROSTATIC HYPERTROPHY, WITH OBSTRUCTION (ICD-600.01) suboptimal response with just flomax.  add finasteride  His updated medication list for this problem includes:    Tamsulosin Hcl 0.4 Mg Caps (Tamsulosin hcl) ..... One by mouth qhs    Finasteride 5 Mg Tabs (Finasteride) ..... One by mouth once daily  Complete Medication List: 1)  Lipitor 80 Mg Tabs (Atorvastatin calcium) .... Take 1 tablet by mouth once a day 2)  Metformin Hcl 1000 Mg Tabs (Metformin hcl) .... One by mouth bid 3)  Lantus Solostar 100 Unit/ml Soln (Insulin glargine) .... 30 units in am and 20 units in pm subcutaneously 4)  Garlique 400 Mg Tbec (Garlic) .[name].. 1 once daily 5)  Bd Ultra-fine Pen Needles 29g X 12.77mm Misc (Insulin pen needle) .... Use three times a day as directed 6)  Low-dose Aspirin 81 Mg Tabs (Aspirin) .... Take 1 tablet by mouth once a day 7)  Lisinopril 20 Mg Tabs (Lisinopril) .... One by mouth once daily 8)  Apidra Solostar 100 Unit/ml Soln (Insulin glulisine) .... Use 10 - 15 units subcutaneously   three times a day before meals 9)  Accu-chek Aviva Strp (Glucose blood) .... For three times a day testing 10)  Omeprazole 20 Mg Cpdr (Omeprazole) .... One by mouth once daily 11)  Cialis 20 Mg Tabs (Tadalafil) .... One by mouth once daily as needed 12)  Tamsulosin Hcl 0.4 Mg Caps (Tamsulosin hcl) .... One by mouth qhs 13)  Lyrica 100 Mg Caps (Pregabalin) .... One by mouth three times a day 14)  Desipramine Hcl 25 Mg Tabs (Desipramine hcl) .... Two at bedtime 15)  Carbamazepine 200 Mg Tabs (Carbamazepine) .... Two two times a day 16)  Miralax Powd (Polyethylene glycol 3350) .Marland KitchenMarland KitchenMarland Kitchen 17 grams mixed with water two times a day 17)  Lidoderm 5 % Ptch (Lidocaine) .... Apply patch q12 hrs once daily at 9:00 pm (take off 9:00 am) 18)  Doxycycline Hyclate 100 Mg Tabs (Doxycycline hyclate) .... One by mouth two times a  day 19)  Finasteride 5 Mg Tabs (Finasteride) .... One by mouth once daily  Patient Instructions: 1)  Please schedule a follow-up appointment in 1 month. Prescriptions: FINASTERIDE 5 MG TABS (FINASTERIDE) one by mouth once daily  #30 x 2   Entered and Authorized by:   D. Thomos Lemons DO   Signed by:   D. Thomos Lemons DO on 07/27/2009   Method used:   Electronically to        [facility] [address] ([facility]ail)       [address]       [place], [place]  [address]       Ph: [phone]       Fax: [phone]  RxID:   [id number] DOXYCYCLINE HYCLATE 100 MG TABS (DOXYCYCLINE HYCLATE) one by mouth two times a day  #20 x 0   Entered and Authorized by:   D. Thomos Lemons DO   Signed by:   D. Thomos Lemons DO on 07/27/2009   Method used:   Electronically to        [facility]       [address]       [place], [place]  [address]       Ph: [phone]       Fax: [phone]   RxID:   [id number] LISINOPRIL 20 MG TABS (LISINOPRIL) one by mouth once daily  #30 x 3   Entered and Authorized by:   D. Thomos Lemons DO   Signed by:   D. Thomos Lemons DO on 07/27/2009   Method used:   Electronically to        [facility]       [address]       [place], [place]  [address]       Ph: [phone]       Fax: [phone]   RxID:   [id number]   Current Allergies (reviewed today): No known allergies    Immunization History:  Zostavax History:    Zostavax # 1:  zostavax (05/31/2009)

## 2010-04-20 NOTE — Assessment & Plan Note (Signed)
Summary: rov/discuss myoview/dm   Referring Provider:  pcp Primary Provider:  Dondra Spry DO   History of Present Illness: Pleasant male with past medical history of coronary artery disease status post coronary artery bypass graft that I have seen previously for evaluation of dyspnea. Note previous carotid Dopplers in October of 2001 showed no significant stenosis. Previous abdominal ultrasound in 2003 showed no aneurysm. A previous BNP was normal at 31. A Myoview in March of 2011 revealed an ejection fraction of 47% and inferior ischemia. The RV was prominent. An echocardiogram in March of 2011 revealed normal LV function and mild left atrial enlargement. A cardiac catheterization in May of 2011 revealed triple-vessel coronary artery disease status post three-vessel CABG with 3/3 patent grafts. Severe stenosis in the proximal and distal body of the saphenous vein graft to the left-sided PDA. Normal left ventricular systolic function. Patient had PCI with a drug-eluting stent at that time. he was seen by Tereso Newcomer recently with dyspnea and he ordered one on December 13. He had a prior inferior infarct with mild peri-infarct ischemia and his ejection fraction 48%. Note there was transient ST segment elevation inferiorly with exercise. Since then he complains of dyspnea on exertion but no orthopnea, PND, pedal edema, palpitations, syncope or chest pain.  Current Medications (verified): 1)  Lipitor 80 Mg Tabs (Atorvastatin Calcium) .... Take One Tablet By Mouth Daily. 2)  Metformin Hcl 1000 Mg  Tabs (Metformin Hcl) .... One By Mouth Bid 3)  Lantus Solostar 100 Unit/ml  Soln (Insulin Glargine) .[name].. 15 Units in Am and 10 Units in Pm Subcutaneously 4)  Pen Needles 5/16" 31g X 8 Mm Misc (Insulin Pen Needle) .... Use As Directed 6 X Daily 5)  Aspirin Ec 325 Mg Tbec (Aspirin) .... Take One Tablet By Mouth Daily 6)  Lisinopril 20 Mg Tabs (Lisinopril) .... One By Mouth Once Daily 7)  Apidra Solostar 100  Unit/ml Soln (Insulin Glulisine) .... Use 5 - 10 Units Subcutaneously   Three Times A Day Before Meals 8)  Accu-Chek Aviva  Strp (Glucose Blood) .... For Three Times A Day Testing 9)  Omeprazole 20 Mg Cpdr (Omeprazole) .... One By Mouth Once Daily 10)  Miralax  Powd (Polyethylene Glycol 3350) .Marland KitchenMarland KitchenMarland Kitchen 17 Grams Mixed With Water Two Times A Day 11)  Finasteride 5 Mg Tabs (Finasteride) .... One By Mouth Once Daily 12)  Effient 10 Mg Tabs (Prasugrel Hcl) .... Take 1 Tablet By Mouth Once A Day 13)  Bupropion Hcl 150 Mg Xr24h-Tab (Bupropion Hcl) .... One By Mouth Once Daily  Allergies: No Known Drug Allergies  Past History:  Past Medical History: Reviewed history from 02/07/2010 and no changes required. CORONARY ARTERY DISEASE (ICD-414.00) DIABETES MELLITUS, TYPE II, UNCONTROLLED (ICD-250.02) HYPERTENSION (ICD-401.9) HYPERLIPIDEMIA (ICD-272.4)   OSTEOARTHRITIS (ICD-715.90) POSTHERPETIC NEURALGIA (ICD-053.19)  HERPES ZOSTER (ICD-053.9) ADJUSTMENT DISORDER WITH MIXED FEATURES (ICD-309.28) DIABETIC PERIPHERAL NEUROPATHY (ICD-250.60) ERECTILE DYSFUNCTION (ICD-302.72) OBESITY (ICD-278.00) GERD (ICD-530.81)   Past Surgical History: Reviewed history from 02/03/2010 and no changes required. Coronary artery bypass graft in 1996 with a LIMA to the LAD, saphenous vein graft to the acute marginal, saphenous vein graft to the PDA and a saphenous vein graft to the circumflex. Right knee arthroscopic surgery in March of 2006 s/p bilateral knee replacement   Shoulder surgery   Cardiac catheterization 5/11 revealed 60-70 LM and all native vessels occluded.cSaphenous vein graft to the first obtuse marginal was patent with mild irregularities in the body of the saphenous vein graft. The saphenous vein  graft to the left-sided PDA was patent.  There was a severe 95% stenosis in the proximal body of the saphenous vein graft.  There was a 90% stenosis in the distal body of the saphenous vein graft. The left  internal mammary artery to the mid LAD was patent. The ejection fraction of 50%. Right heart cath revealed central aortic pressure 157/86, left ventricular pressure 150/16, left ventricular end-diastolic pressure 17, right atrial pressure 9, right ventricular pressure 29/7, right ventricular end-diastolic pressure 11, pulmonary artery pressure 31/13 with a mean of 22, pulmonary capillary wedge pressure 13, central aortic saturation 96%, pulmonary artery saturation 65%, cardiac output 4.9 L per minute. Cardiac index 2 L per minute per meter squared.  Social History: Reviewed history from 02/03/2010 and no changes required. Never Smoked Alcohol use-yes     Married Occupation: truck Hospital doctor, retired              Review of Systems       Some pain from recent rib fracture but no fevers or chills, productive cough, hemoptysis, dysphasia, odynophagia, melena, hematochezia, dysuria, hematuria, rash, seizure activity, orthopnea, PND, pedal edema, claudication. Remaining systems are negative.   Vital Signs:  Patient profile:   65 year old male Height:      74 inches Weight:      247 pounds BMI:     31.83 Pulse rate:   87 / minute Resp:     16 per minute BP sitting:   136 / 82  (left arm)  Vitals Entered By: Kem Parkinson (March 01, 2010 12:19 PM)  Physical Exam  General:  Well-developed well-nourished in no acute distress.  Skin is warm and dry.  HEENT is normal.  Neck is supple. No thyromegaly.  Chest is clear to auscultation with normal expansion.  Cardiovascular exam is regular rate and rhythm.  Abdominal exam nontender or distended. No masses palpated. Extremities show no edema. neuro grossly intact    Impression & Recommendations:  Problem # 1:  CARDIOVASCULAR STUDIES, ABNORMAL (ICD-794.30) I have reviewed the patient's nuclear study with him. There appears to be a new inferior infarct with mild peri-infarct ischemia. This is worse compared to previous. He also had  transient ST elevation in the inferior leads with exercise. Given his dyspnea we will proceed with cardiac catheterization. The recently placed stents to his saphenous graft may have occluded. The risks and benefits including but not limited to stroke, myocardial infarction and death were explained and the patient agrees to proceed.  Problem # 2:  HYPERTENSION (ICD-401.9) Blood pressure controlled on present medications. Will continue. His updated medication list for this problem includes:    Aspirin Ec 325 Mg Tbec (Aspirin) .[name]... Take one tablet by mouth daily    Lisinopril 20 Mg Tabs (Lisinopril) ..... One by mouth once daily  Orders: TLB-BMP (Basic Metabolic Panel-BMET) (80048-METABOL)  Problem # 3:  HYPERLIPIDEMIA (ICD-272.4) Continue statin. His updated medication list for this problem includes:    Lipitor 80 Mg Tabs (Atorvastatin calcium) .[name]... Take one tablet by mouth daily.  Problem # 4:  CORONARY ARTERY DISEASE (ICD-414.00) Continue aspirin, ACE inhibitor and statin. His updated medication list for this problem includes:    Aspirin Ec 325 Mg Tbec (Aspirin) .[name]... Take one tablet by mouth daily    Lisinopril 20 Mg Tabs (Lisinopril) ..... One by mouth once daily    Effient 10 Mg Tabs (Prasugrel hcl) .[name]... Take 1 tablet by mouth once a day  Problem # 5:  DIABETES MELLITUS,  TYPE II, UNCONTROLLED (ICD-250.02)  His updated medication list for this problem includes:    Metformin Hcl 1000 Mg Tabs (Metformin hcl) ..... One by mouth bid    Lantus Solostar 100 Unit/ml Soln (Insulin glargine) .Marland KitchenMarland KitchenMarland KitchenMarland Kitchen 15 units in am and 10 units in pm subcutaneously    Aspirin Ec 325 Mg Tbec (Aspirin) .[name]... Take one tablet by mouth daily    Lisinopril 20 Mg Tabs (Lisinopril) ..... One by mouth once daily    Apidra Solostar 100 Unit/ml Soln (Insulin glulisine) ..... Use 5 - 10 units subcutaneously   three times a day before meals  Other Orders: TLB-CBC Platelet - w/Differential (85025-CBCD) TLB-PT (Protime)  (85610-PTP) Cardiac Catheterization (Cardiac Cath)  Patient Instructions: 1)  Your physician recommends that you schedule a follow-up appointment in: 8 WEEKS IN [place] 2)  Your physician has requested that you have a cardiac catheterization.  Cardiac catheterization is used to diagnose and/or treat various heart conditions. Doctors may recommend this procedure for a number of different reasons. The most common reason is to evaluate chest pain. Chest pain can be a symptom of coronary artery disease (CAD), and cardiac catheterization can show whether plaque is narrowing or blocking your heart's arteries. This procedure is also used to evaluate the valves, as well as measure the blood flow and oxygen levels in different parts of your heart.  For further information please visit [email or web address]  Please follow instruction sheet, as given.

## 2010-04-20 NOTE — Progress Notes (Signed)
Summary: question on meds  Phone Note Call from Patient Call back at cell#[phone]   Caller: Spouse/glena [phone] Reason for Call: Talk to Nurse Summary of Call: pt has question on meds. pt states prescription is not correct. Initial call taken by: [name],  March 09, 2010 10:32 AM  Follow-up for Phone Call        RN s/w Pt's Wife - [name] re: diltiazem dose. Pt was discharged on 180 Mg and had been taking that dose. EMR (incorrectly) shows Pt was previously on 240 Mg.  RN advised that Pt should take the new Rx for 120Mg  and to call back if additional questions. Pt's Wife verbalizes understanding.  Bernita Raisin, RN, BSN  March 09, 2010 11:47 AM

## 2010-04-20 NOTE — Progress Notes (Signed)
Summary: Burpropion Refill  Phone Note Refill Request Message from:  Fax from Pharmacy on January 26, 2010 9:18 AM  Refills Requested: Medication #1:  BUPROPION HCL 150 MG XR24H-TAB one by mouth once daily.   Dosage confirmed as above?Dosage Confirmed   Brand Name Necessary? No   Supply Requested: 3 months  Method Requested: Electronic Next Appointment Scheduled: 02-03-2010 dr Artist Pais  Initial call taken by: Roselle Locus,  January 26, 2010 9:19 AM  Follow-up for Phone Call        [facility] [place] Spoke with Dennard Nip  he states rx is on file from 9/20.  Follow-up by: Glendell Docker CMA,  January 26, 2010 10:50 AM     Appended Document: Burpropion Refill Received voice message from [place] [facility] [place] [phone]. He states pt is requesting a 90 day supply on Bupropion XL. Called back and spoke to [place]. Gave authorization to dispense 90 day supply x no refills and to cancel refill on previous script.

## 2010-04-20 NOTE — Letter (Signed)
Summary: CPAP Form  [facility]  [address]   [place], [place] [address]   Phone: [phone]  Fax: [phone]    01/25/2010   MEDICARE COMPLIANCE FORM POST-PAP SET UP  Gaige Yankee [birth year]        Setup Date__ MRN: [id number]  Required Visit Date_     between 31 and 91 days of inital PAP therapy  Documentation of Use of PAP Therapy Over a 30 day period, our patient has used CPAP or Bilevel PAP for = 4 hours/night ________ % of the time.  [Medicare requires 4+ hours/ night of use = 70% of the nights in 30 consecutive days for continued coverage for PAP therapy.  Documentation of Symptomatic Improvement   Medicare also requires the documentation of benefit from PAP treatment of obstructive sleep apnea.  Date of face-to-face reevaluation with treating physician:     Documenting one of the following symptomatic improvements:  ___   Improved ___   Daytime sleepiness/fatigue  ___   Observed apneas/choking/gasping during sleep  ___   Morning headache  ___   Not improved ___     Other:_____   DR.  NPI #____  This letter has been electronically signed by the physician.

## 2010-04-20 NOTE — Progress Notes (Signed)
Summary: patient's insurance requires a 90 day supply for lyrica  Phone Note From Pharmacy   [facility] Call For: Markeya Mincy   Summary of Call: patients insurance requires a 90 day supply for the lyrica  Initial call taken by: Roselle Locus,  March 15, 2009 1:34 PM  Follow-up for Phone Call        90 day script called in Follow-up by: Alfred Levins, CMA,  March 15, 2009 4:47 PM    Prescriptions: LYRICA 100 MG CAPS (PREGABALIN) one by mouth three times a day  #270 x 1   Entered by:   Alfred Levins, CMA   Authorized by:   D. Thomos Lemons DO   Signed by:   Alfred Levins, CMA on 03/15/2009   Method used:   Telephoned to ...       [facility] [address] [facility]ail[facility]       [address]       [place], [place]  [address]       Ph: [phone]       Fax: [phone]   RxID:   [id number]

## 2010-04-20 NOTE — Assessment & Plan Note (Addendum)
Summary: 4 month follow up in HP//jwr   Copy to:  pcp Primary Provider/Referring Provider:  Dondra Spry DO  CC:  follow-up visit.  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 [name] reports loud snoring especially on his back & witnessed apneas & non refreshing sleep. Epworth Sleepiness Score is 21/24 as reported by wife.  PSG 6/27 /11  - wt 246 lbs -showed severe obstructive sleep apnea with AHI 50/h & desaturation to 87%, corrected by CPAP 10 cm - he did not tolerate a full face mask well due to shingles . This has improved but still has neuropathic pain. He reports dyspne aon exertion, walking up hills or stairs. Recent stents placed - evaluation by dr Janeann Forehand, echo 3/11 showed RVSP 25, nml LV funtion. He has been on 20 mg lisnorpil , will NOT take beta blocker.   CXR 08/12/09 nml  December 13, 2009 12:01 PM  download 8/23-9/26 >> good compliance, avg pr 10 cm - residual AHI 15/h, central 4/h, leak ++  April 11, 2010 3:33 PM  shingles pain gone now ,cpap download 10/12- 01/28/10 >> excellent compliance , some residual events  (AHI 21/h, centrals 5/h ) on 10 cm, will increase to 12 cm & rechk, leak ++ on download, c/o dry mouth Wife still concerned about apneas Interim- required another stent download on 12 cm shows leak ++, residual AHI 21/h,centrals 5/h,  good usage  Current Medications (verified): 1)  Lipitor 80 Mg Tabs (Atorvastatin Calcium) .... Take One Tablet By Mouth Daily. 2)  Metformin Hcl 1000 Mg  Tabs (Metformin Hcl) .... One By Mouth Bid 3)  Lantus Solostar 100 Unit/ml  Soln (Insulin Glargine) .[name].. 15 Units in Am and 10 Units in Pm Subcutaneously 4)  Pen Needles 5/16" 31g X 8 Mm Misc (Insulin Pen Needle) .... Use As Directed 6 X Daily 5)  Aspirin Ec 325 Mg Tbec (Aspirin) .... Take One Tablet By Mouth Daily 6)  Lisinopril 20 Mg Tabs (Lisinopril) .... One By Mouth Once  Daily 7)  Apidra Solostar 100 Unit/ml Soln (Insulin Glulisine) .... Use 5 - 10 Units Subcutaneously   Three Times A Day Before Meals 8)  Accu-Chek Aviva  Strp (Glucose Blood) .... For Three Times A Day Testing 9)  Miralax  Powd (Polyethylene Glycol 3350) .Marland KitchenMarland KitchenMarland Kitchen 17 Grams Mixed With Water Two Times A Day 10)  Effient 10 Mg Tabs (Prasugrel Hcl) .... Take 1 Tablet By Mouth Once A Day 11)  Bupropion Hcl 150 Mg Xr24h-Tab (Bupropion Hcl) .... One By Mouth Once Daily 12)  Accu-Chek Aviva  Strp (Glucose Blood) .... Use Up Pt 5 X Per Day As Directed 13)  Accu-Chek Multiclix Lancets  Misc (Lancets) .... Use Up To 5 X Daily As Directed 14)  Bystolic 2.5 Mg Tabs (Nebivolol Hcl) .... One By Mouth Once Daily 15)  Pantoprazole Sodium 40 Mg Tbec (Pantoprazole Sodium) .... One By Mouth Once Daily 16)  Gabapentin 300 Mg Caps (Gabapentin) .... One By Mouth At Bedtime As Needed For Neuropathy Pain  Allergies (verified): No Known Drug Allergies  Past History:  Past Medical History: Last updated: 03/09/2010 CORONARY ARTERY DISEASE (ICD-414.00) DIABETES MELLITUS, TYPE II, UNCONTROLLED (ICD-250.02) HYPERTENSION (ICD-401.9) HYPERLIPIDEMIA (ICD-272.4)   OSTEOARTHRITIS (ICD-715.90) POSTHERPETIC NEURALGIA (ICD-053.19)  HERPES ZOSTER (ICD-053.9)  ADJUSTMENT DISORDER WITH MIXED FEATURES (ICD-309.28) DIABETIC PERIPHERAL NEUROPATHY (ICD-250.60) ERECTILE DYSFUNCTION (ICD-302.72) OBESITY (ICD-278.00) GERD (ICD-530.81)   Social History: Last updated:  02/03/2010 Never Smoked Alcohol use-yes     Married Occupation: Naval architect, retired              Review of Systems  The patient denies anorexia, fever, weight loss, weight gain, vision loss, decreased hearing, hoarseness, chest pain, syncope, dyspnea on exertion, peripheral edema, prolonged cough, headaches, hemoptysis, abdominal pain, melena, hematochezia, severe indigestion/heartburn, hematuria, muscle weakness, suspicious skin lesions, difficulty walking,  depression, unusual weight change, abnormal bleeding, enlarged lymph nodes, and angioedema.    Vital Signs:  Patient profile:   65 year old male Height:      74 inches Weight:      249 pounds BMI:     32.09 O2 Sat:      96 % on Room air Temp:     97.8 degrees F oral Pulse rate:   51 / minute BP sitting:   128 / 72  (left arm) Cuff size:   large  Vitals Entered By: Zackery Barefoot CMA (April 11, 2010 4:07 PM)  O2 Flow:  Room air CC: follow-up visit Comments Medications reviewed with patient Verified contact number and pharmacy with patient Zackery Barefoot [facility]  April 11, 2010 4:07 PM    Physical Exam  Additional Exam:  wt 246 December 14, 2009 , 249 April 11, 2010  Gen. Pleasant, well-nourished, in no distress, normal affect ENT - no lesions, no post nasal drip, class 2 airway Neck: No JVD, no thyromegaly, no carotid bruits Lungs: no use of accessory muscles, no dullness to percussion, clear without rales or rhonchi  Cardiovascular: Rhythm regular, heart sounds  normal, no murmurs or gallops, no peripheral edema Musculoskeletal: No deformities, no cyanosis or clubbing      Impression & Recommendations:  Problem # 1:  SLEEP APNEA, OBSTRUCTIVE (ICD-327.23)  Chk download on 12 cm Trial of chin strap to decrease leak from mouth, if unable then will need full face mask Compliance encouraged, wt loss emphasized, asked to avoid meds with sedative side effects, cautioned against driving when sleepy.  Orders: Est. Patient Level III ([address])  Patient Instructions: 1)  Copy sent to: 2)  Please schedule a follow-up appointment in 3 months. 3)  Trial of chin strap 4)  If does not work, then full face mask 5)  If still not OK, will need repeat in lab PAP titration  Appended Document: 4 month follow up in HP//jwr let him know that download showed leak from mouth breathing - chin strap or full f ace mask recommended.  Appended Document: 4 month follow up in HP//jwr Pt  informed and stated he and RA discussed trying chin strap first and would like for RA to order same.  Appended Document: Orders Update Orders: Added new Referral order of DME Referral (DME) - Signedtrial of chin strap, if unable to use or persistent leak then use full face mask    Clinical Lists Changes  Orders: Added new Referral order of DME Referral (DME) - Signed

## 2010-04-20 NOTE — Assessment & Plan Note (Signed)
Summary: sleep apnea obstruction/jd   Visit Type:  Initial Consult Copy to:  pcp Primary Provider/Referring Provider:  Dondra Spry DO  CC:  Sleep ocnsult.  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 [name] reports loud snoring especially on his back & witnessed apneas & non refreshing sleep. Epworth Sleepiness Score is 21/24 as reported by wife. Bedtime around midnight, latency about 30 mins(chronic) , sleeps on his back with 2-3 pillows, 2-4 waakenings incl noctuira, wakes up at 0800 tired. Naps x 1 hr daily in bed, drinks coffee x 2 cups in am & decaffeinated tea. There is no history suggestive of cataplexy, sleep paralysis or parasomnias  PSG 6/27 /11  - wt 246 lbs -showed severe obstructive sleep apnea with AHI 50/h & desaturation to 87%, corrected by CPAP 10 cm - he did not tolerate a fullf ace mask well due to shingles . This has improved but still has neuropathic pain. He reports dyspne aon exertion, walking up hills or stairs. Recent stents placed - evaluation by dr Janeann Forehand, echo 3/11 showed RVSP 25, nml LV funtion. He has been on 20 mg lisnorpil , will NOT take beta blocker, denies cough or throat clearing.  CXR 08/12/09 nml  Preventive Screening-Counseling & Management  Alcohol-Tobacco     Smoking Status: never   History of Present Illness: Heavy snoring dry mouth only sleeps aboout 2 hours and then wakes up. Never feel rested  What time do you typically go to bed?(between what hours): 12:00a  How long does it take you to fall asleep? 30 min to 1 hour  How many times during the night do you wake up? 4  What time do you get out of bed to start your day? 8:00  Do you drive or operate heavy machinery in your occupation? no  How much has your weight changed (up or down) over the past two years? (in pounds): 5-10 pounds up and down  Have you ever had a sleep study before?   If yes,when and where: yes September 14, 2009  Do you currently use CPAP ? If so , at what pressure? no  Do you wear oxygen at any time? If yes, how many liters per minute? no Current Medications (verified): 1)  Lipitor 80 Mg Tabs (Atorvastatin Calcium) .... Take One Tablet By Mouth Daily. 2)  Metformin Hcl 1000 Mg  Tabs (Metformin Hcl) .... One By Mouth Bid 3)  Lantus Solostar 100 Unit/ml  Soln (Insulin Glargine) .... 30 Units in Am and 20 Units in Pm Subcutaneously 4)  Garlique 400 Mg  Tbec (Garlic) .[name].. 1 Once Daily 5)  Bd Ultra-Fine Pen Needles 29g X 12.72mm  Misc (Insulin Pen Needle) .... Use Three Times A Day As Directed 6)  Aspirin Ec 325 Mg Tbec (Aspirin) .... Take One Tablet By Mouth Daily 7)  Lisinopril 20 Mg Tabs (Lisinopril) .... One By Mouth Once Daily 8)  Apidra Solostar 100 Unit/ml Soln (Insulin Glulisine) .... Use 10 - 15 Units Subcutaneously   Three Times A Day Before Meals 9)  Accu-Chek Aviva  Strp (Glucose Blood) .... For Three Times A Day Testing 10)  Omeprazole 20 Mg Cpdr (Omeprazole) .... One By Mouth Once Daily 11)  Tamsulosin Hcl 0.4 Mg Caps (Tamsulosin Hcl) .... One By Mouth Qhs 12)  Miralax  Powd (Polyethylene Glycol 3350) .Marland KitchenMarland KitchenMarland Kitchen 17 Grams Mixed With Water Two Times A Day 13)  Finasteride  5 Mg Tabs (Finasteride) .... One By Mouth Once Daily 14)  Effient 10 Mg Tabs (Prasugrel Hcl) .... Take 1 Tablet By Mouth Once A Day 15)  Daily Multi  Tabs (Multiple Vitamins-Minerals) .... Take 1 Tablet By Mouth Once A Day  Allergies (verified): No Known Drug Allergies  Past History:  Past Medical History: Last updated: 09/14/2009 HYPERTENSION (ICD-401.9) HYPERLIPIDEMIA (ICD-272.4) CORONARY ARTERY DISEASE (ICD-414.00)  OSTEOARTHRITIS (ICD-715.90) POSTHERPETIC NEURALGIA (ICD-053.19) HERPES ZOSTER (ICD-053.9) ADJUSTMENT DISORDER WITH MIXED FEATURES (ICD-309.28) DIABETIC PERIPHERAL NEUROPATHY (ICD-250.60) ERECTILE DYSFUNCTION (ICD-302.72) DIABETES MELLITUS, TYPE II, UNCONTROLLED  (ICD-250.02) OBESITY (ICD-278.00) GERD (ICD-530.81)   Past Surgical History: Last updated: 09/14/2009 Coronary artery bypass graft in 1996 with a LIMA to the LAD, saphenous vein graft to the acute marginal, saphenous vein graft to the PDA and a saphenous vein graft to the circumflex. Right knee arthroscopic surgery in March of 2006 s/p bilateral knee replacement  Shoulder surgery  Cardiac catheterization 5/11 revealed 60-70 LM and all native vessels occluded.cSaphenous vein graft to the first obtuse marginal was patent with mild irregularities in the body of the saphenous vein graft. The saphenous vein graft to the left-sided PDA was patent.  There was a severe 95% stenosis in the proximal body of the saphenous vein graft.  There was a 90% stenosis in the distal body of the saphenous vein graft. The left internal mammary artery to the mid LAD was patent. The ejection fraction of 50%. Right heart cath revealed central aortic pressure 157/86, left ventricular pressure 150/16, left ventricular end-diastolic pressure 17, right atrial pressure 9, right ventricular pressure 29/7, right ventricular end-diastolic pressure 11, pulmonary artery pressure 31/13 with a mean of 22, pulmonary capillary wedge pressure 13, central aortic saturation 96%, pulmonary artery saturation 65%, cardiac output 4.9 L per minute. Cardiac index 2 L per minute per meter squared.  Family History: Last updated: 2009/09/13 Mother died of coronary artery disease at age 62 Father is alive at age 60 with coronary artery disease Half-brother who has a history of stroke              Social History: Last updated: 11/01/2009 Never Smoked Alcohol use-yes    Married Occupation: truck Hospital doctor, retired             Risk Factors: Smoking Status: never (11/01/2009)  Social History: Never Smoked Alcohol use-yes    Married Occupation: Naval architect, retired             Review of Systems       The patient complains of shortness  of breath with activity, shortness of breath at rest, acid heartburn, indigestion, headaches, and anxiety.  The patient denies productive cough, non-productive cough, coughing up blood, chest pain, irregular heartbeats, loss of appetite, weight change, abdominal pain, difficulty swallowing, sore throat, tooth/dental problems, nasal congestion/difficulty breathing through nose, sneezing, itching, ear ache, depression, hand/feet swelling, joint stiffness or pain, rash, change in color of mucus, and fever.    Vital Signs:  Patient profile:   65 year old male Height:      75 inches Weight:      246 pounds O2 Sat:      96 % on Room air Temp:     98.0 degrees F oral Pulse rate:   80 / minute BP sitting:   122 / 82  (left arm) Cuff size:   large  Vitals Entered By: Zackery Barefoot CMA (November 01, 2009 3:29 PM)  O2 Flow:  Room air CC: Sleep ocnsult Comments Medications reviewed  with patient Verified contact number and pharmacy with patient Zackery Barefoot CMA  November 01, 2009 3:29 PM    Physical Exam  Additional Exam:  Gen. Pleasant, well-nourished, in no distress, normal affect ENT - no lesions, no post nasal drip, class 2 airway Neck: No JVD, no thyromegaly, no carotid bruits Lungs: no use of accessory muscles, no dullness to percussion, clear without rales or rhonchi  Cardiovascular: Rhythm regular, heart sounds  normal, no murmurs or gallops, no peripheral edema Abdomen: soft and non-tender, no hepatosplenomegaly, BS normal. Musculoskeletal: No deformities, no cyanosis or clubbing Neuro:  alert, non focal     Impression & Recommendations:  Problem # 1:  SLEEP APNEA, OBSTRUCTIVE (ICD-327.23) Severe obstructive sleep apnea on PSG with sleep fragmentation & desaturation corrected by 10 cm Initiate CPAP at this level with nasal pilows- due to neuropathic pain of shingles - once resolved, can try fulf ace mask in future -if significant mouth breathing. Compliance encouraged, wt loss  emphasized, asked to avoid meds with sedative side effects, cautioned against driving when sleepy.  Orders: Consultation Level III (13086) DME Referral (DME) Consultation Level IV (57846)  Problem # 2:  DYSPNEA (ICD-786.05) In this non smoker, doubt airways disease.No pulm htn on echo. No allergies.  Doubt lsisnopril induced. Will observe for now - expect some improvement post CPAP use. Orders: Consultation Level IV (96295)  Patient Instructions: 1)  Copy sent to: dr Artist Pais 2)  Please schedule a follow-up appointment in 1 month. 3)  Start CPAP machine with nasal prongs 4)  Turn in chip after 4 weeks 5)  Weight loss

## 2010-04-20 NOTE — Medication Information (Signed)
Summary: Fax Regarding Use of Aspirin Containing Meds/[facility]  Fax Regarding Use of Aspirin Containing Meds/[facility]   Imported By: Lanelle Bal 12/20/2009 09:21:04  _____________________________________________________________________  External Attachment:    Type:   Image     Comment:   External Document

## 2010-04-21 ENCOUNTER — Telehealth: Payer: Self-pay | Admitting: Internal Medicine

## 2010-04-21 NOTE — Op Note (Signed)
Summary: Coronary Artery Bypass  Coronary Artery Bypass   Imported By: Lenard Forth 01/27/2009 17:25:59  _____________________________________________________________________  External Attachment:    Type:   Image     Comment:   External Document

## 2010-04-21 NOTE — Cardiovascular Report (Signed)
Summary: Pre Cath Orders  Pre Cath Orders   Imported By: Roderic Ovens 08/17/2009 14:08:32  _____________________________________________________________________  External Attachment:    Type:   Image     Comment:   External Document

## 2010-04-26 NOTE — Assessment & Plan Note (Signed)
Summary: [facility]   Visit Type:  Follow-up Referring Provider:  pcp Primary Provider:  Dondra Spry DO  CC:  near-syncope. Pt. did not decrease Bystolic to 2.5 yet. Still taking 5mg  daily.  History of Present Illness: Pleasant male with past medical history of coronary artery disease status post coronary artery bypass graft that I have seen previously for evaluation of dyspnea. Note previous carotid Dopplers in October of 2001 showed no significant stenosis. Previous abdominal ultrasound in 2003 showed no aneurysm. A previous BNP was normal at 31. A Myoview in March of 2011 revealed an ejection fraction of 47% and inferior ischemia. The RV was prominent. An echocardiogram in March of 2011 revealed normal LV function and mild left atrial enlargement. A cardiac catheterization in May of 2011 revealed triple-vessel coronary artery disease status post three-vessel CABG with 3/3 patent grafts. Severe stenosis in the proximal and distal body of the saphenous vein graft to the left-sided PDA. Normal left ventricular systolic function. Patient had PCI with a drug-eluting stent at that time. Had repeat cardiac cath in Dec of 2011 for dyspnea and f/u abnormal myoview. This revealed  and EF of 50. Continued patency internal mammary to the LAD. Occluded saphenous vein graft to the recent percutaneous intervention to the distal circumflex/PDA territory. Progressive disease in the saphenous vein graft to the OM. Patient evaluated by CVTS. Distal vessels felt to be poor targets. CABG could be considered in the future if PCI options were exhausted. Patient had PCI of the saphenous vein graft to the obtuse marginal. I last saw him 12/11. Since then, he has dyspnea with more moderate activities but not with routine activities. It is relieved with rest. There is no orthopnea, PND, pedal edema, palpitations, syncope or chest pain.  Current Medications (verified): 1)  Lipitor 80 Mg Tabs (Atorvastatin Calcium)  .... Take One Tablet By Mouth Daily. 2)  Metformin Hcl 1000 Mg  Tabs (Metformin Hcl) .... One By Mouth Bid 3)  Lantus Solostar 100 Unit/ml  Soln (Insulin Glargine) .[name].. 15 Units in Am and 10 Units in Pm Subcutaneously 4)  Pen Needles 5/16" 31g X 8 Mm Misc (Insulin Pen Needle) .... Use As Directed 6 X Daily 5)  Aspirin Ec 325 Mg Tbec (Aspirin) .... Take One Tablet By Mouth Daily 6)  Lisinopril 20 Mg Tabs (Lisinopril) .... One By Mouth Once Daily 7)  Apidra Solostar 100 Unit/ml Soln (Insulin Glulisine) .... Use 5 - 10 Units Subcutaneously   Three Times A Day Before Meals 8)  Accu-Chek Aviva  Strp (Glucose Blood) .... For Three Times A Day Testing 9)  Miralax  Powd (Polyethylene Glycol 3350) .Marland KitchenMarland KitchenMarland Kitchen 17 Grams Mixed With Water Two Times A Day 10)  Effient 10 Mg Tabs (Prasugrel Hcl) .... Take 1 Tablet By Mouth Once A Day 11)  Bupropion Hcl 150 Mg Xr24h-Tab (Bupropion Hcl) .... One By Mouth Once Daily 12)  Accu-Chek Aviva  Strp (Glucose Blood) .... Use Up Pt 5 X Per Day As Directed 13)  Accu-Chek Multiclix Lancets  Misc (Lancets) .... Use Up To 5 X Daily As Directed 14)  Bystolic 2.5 Mg Tabs (Nebivolol Hcl) .... One By Mouth Once Daily 15)  Pantoprazole Sodium 40 Mg Tbec (Pantoprazole Sodium) .... One By Mouth Once Daily 16)  Gabapentin 300 Mg Caps (Gabapentin) .... One By Mouth At Bedtime As Needed For Neuropathy Pain  Allergies (verified): No Known Drug Allergies  Past History:  Past Medical History: Reviewed history from 03/09/2010 and no changes required. CORONARY  ARTERY DISEASE (ICD-414.00) DIABETES MELLITUS, TYPE II, UNCONTROLLED (ICD-250.02) HYPERTENSION (ICD-401.9) HYPERLIPIDEMIA (ICD-272.4)   OSTEOARTHRITIS (ICD-715.90) POSTHERPETIC NEURALGIA (ICD-053.19)  HERPES ZOSTER (ICD-053.9)  ADJUSTMENT DISORDER WITH MIXED FEATURES (ICD-309.28) DIABETIC PERIPHERAL NEUROPATHY (ICD-250.60) ERECTILE DYSFUNCTION (ICD-302.72) OBESITY (ICD-278.00) GERD (ICD-530.81)   Past Surgical  History: Reviewed history from 03/08/2010 and no changes required. Coronary artery bypass graft in 1996 with a LIMA to the LAD, saphenous vein graft to the acute marginal, saphenous vein graft to the PDA and a saphenous vein graft to the circumflex. Right knee arthroscopic surgery in March of 2006 s/p bilateral knee replacement   Shoulder surgery  Social History: Reviewed history from 02/03/2010 and no changes required. Never Smoked Alcohol use-yes     Married Occupation: truck Hospital doctor, retired              Review of Systems       Problems with ED but no fevers or chills, productive cough, hemoptysis, dysphasia, odynophagia, melena, hematochezia, dysuria, hematuria, rash, seizure activity, orthopnea, PND, pedal edema, claudication. Remaining systems are negative.   Vital Signs:  Patient profile:   65 year old male Height:      74 inches Weight:      249 pounds BMI:     32.09 Pulse rate:   52 / minute Pulse rhythm:   regular Resp:     18 per minute BP sitting:   148 / 76  (left arm) Cuff size:   large  Vitals Entered By: Vikki Ports (April 19, 2010 11:30 AM)  Physical Exam  General:  Well-developed well-nourished in no acute distress.  Skin is warm and dry.  HEENT is normal.  Neck is supple. No thyromegaly.  Chest is clear to auscultation with normal expansion.  Cardiovascular exam is regular rate and rhythm.  Abdominal exam nontender or distended. No masses palpated. Extremities show no edema. neuro grossly intact    Impression & Recommendations:  Problem # 1:  DYSPNEA (ICD-786.05) He continues to have some problems with dyspnea. I am not convinced this is completely cardiac related. Redo surgery would be at increased risk. Plan medical therapy for now.  His updated medication list for this problem includes:    Aspirin Ec 325 Mg Tbec (Aspirin) .[name]... Take one tablet by mouth daily    Lisinopril 20 Mg Tabs (Lisinopril) ..... One by mouth once daily    Bystolic  2.5 Mg Tabs (Nebivolol hcl) ..... One by mouth once daily  Problem # 2:  SLEEP APNEA, OBSTRUCTIVE (ICD-327.23) Management per pulmonary.  Problem # 3:  HYPERTENSION (ICD-401.9) Blood pressure mildly elevated. We will follow this and we will increase medications as needed. His updated medication list for this problem includes:    Aspirin Ec 325 Mg Tbec (Aspirin) .[name]... Take one tablet by mouth daily    Lisinopril 20 Mg Tabs (Lisinopril) ..... One by mouth once daily    Bystolic 2.5 Mg Tabs (Nebivolol hcl) ..... One by mouth once daily  Problem # 4:  HYPERLIPIDEMIA (ICD-272.4) Continue statin. Lipids and liver monitored by primary care. His updated medication list for this problem includes:    Lipitor 80 Mg Tabs (Atorvastatin calcium) .[name]... Take one tablet by mouth daily.  Problem # 5:  CORONARY ARTERY DISEASE (ICD-414.00) Continue aspirin, beta blocker, ACE inhibitor and statin. Continue effient. His updated medication list for this problem includes:    Aspirin Ec 325 Mg Tbec (Aspirin) .[name]... Take one tablet by mouth daily    Lisinopril 20 Mg Tabs (Lisinopril) ..... One  by mouth once daily    Effient 10 Mg Tabs (Prasugrel hcl) .[name]... Take 1 tablet by mouth once a day    Bystolic 2.5 Mg Tabs (Nebivolol hcl) ..... One by mouth once daily  Problem # 6:  DIABETES MELLITUS, TYPE II, UNCONTROLLED (ICD-250.02)  His updated medication list for this problem includes:    Metformin Hcl 1000 Mg Tabs (Metformin hcl) ..... One by mouth bid    Lantus Solostar 100 Unit/ml Soln (Insulin glargine) .Marland KitchenMarland KitchenMarland KitchenMarland Kitchen 15 units in am and 10 units in pm subcutaneously    Aspirin Ec 325 Mg Tbec (Aspirin) .[name]... Take one tablet by mouth daily    Lisinopril 20 Mg Tabs (Lisinopril) ..... One by mouth once daily    Apidra Solostar 100 Unit/ml Soln (Insulin glulisine) ..... Use 5 - 10 units subcutaneously   three times a day before meals  Patient Instructions: 1)  Your physician wants you to follow-up in: 6 MONTHS  You will  receive a reminder letter in the mail two months in advance. If you don't receive a letter, please call our office to schedule the follow-up appointment.

## 2010-04-26 NOTE — Progress Notes (Signed)
Summary: [facility]  Phone Note Outgoing Call Call back at [phone]   Call placed by: Glendell Docker CMA,  April 21, 2010 9:49 AM Call placed to: [facility] Summary of Call: Faxed recieved for request of patient information, for office note and lab results from [facility]. Call was placed to [phone], spoke with Jesusita Oka he was informed before patient information can be released we will need signed authorization from the patient on file. He stated they did not have one from the patient,and are waiting for the patient to return the authorization. He was informed once authorization was recieved to release information, the requested information would be sent.  Initial call taken by: Glendell Docker CMA,  April 21, 2010 9:49 AM

## 2010-04-27 ENCOUNTER — Encounter: Payer: Self-pay | Admitting: Cardiology

## 2010-05-03 ENCOUNTER — Ambulatory Visit (INDEPENDENT_AMBULATORY_CARE_PROVIDER_SITE_OTHER): Payer: Medicare Other | Admitting: Internal Medicine

## 2010-05-03 ENCOUNTER — Encounter: Payer: Self-pay | Admitting: Internal Medicine

## 2010-05-03 DIAGNOSIS — I10 Essential (primary) hypertension: Secondary | ICD-10-CM

## 2010-05-04 ENCOUNTER — Telehealth: Payer: Self-pay | Admitting: Internal Medicine

## 2010-05-04 NOTE — Miscellaneous (Signed)
Summary: [facility] Report   [facility]   Imported By: Roderic Ovens 04/24/2010 14:09:46  _____________________________________________________________________  External Attachment:    Type:   Image     Comment:   External Document

## 2010-05-04 NOTE — Miscellaneous (Signed)
Summary: [facility]    [facility] Progress Report   Imported By: Roderic Ovens 04/24/2010 14:21:23  _____________________________________________________________________  External Attachment:    Type:   Image     Comment:   External Document

## 2010-05-04 NOTE — Medication Information (Signed)
Summary: [facility]  [facility]   Imported By: Lanelle Bal 04/25/2010 12:52:45  _____________________________________________________________________  External Attachment:    Type:   Image     Comment:   External Document

## 2010-05-04 NOTE — Assessment & Plan Note (Signed)
Summary: Medicare wellness/dt   Vital Signs:  Patient profile:   65 year old male Height:      74 inches Weight:      249 pounds BMI:     32.09 O2 Sat:      97 % on Room air Temp:     97.4 degrees F oral Pulse rate:   54 / minute Resp:     18 per minute BP sitting:   140 / 70  (right arm) Cuff size:   large  Vitals Entered By: Glendell Docker CMA (April 11, 2010 1:32 PM)  O2 Flow:  Room air CC: CPX Is Patient Diabetic? Yes Did you bring your meter with you today? Yes Pain Assessment Patient in pain? no      Comments acid reflux has worsened-burning senstation in chest, increase in belching, used over the counter Zantac with relief, questions about diabetic supplies-from another company   Primary Care Provider:  D. Thomos Lemons DO  CC:  CPX.  History of Present Illness: 65 y/o male for routine cpx  few days after starting bystolic   pt noticed left arm pain pt had been working around the house wife put "biofreeze " on left shoulder and symptoms resolved.  pt never stopped bystolic pt denies significant worsening ED  pt has been experiencing increasing heartburn with more belching  DM II 7 day avg - 152 14 day avg - 161 30 day avg - 152     Preventive Screening-Counseling & Management  Alcohol-Tobacco     Alcohol drinks/day: 0     Smoking Status: never  Caffeine-Diet-Exercise     Caffeine use/day: 1-2 beveraage daily     Does Patient Exercise: no  Allergies (verified): No Known Drug Allergies  Past History:  Past Medical History: CORONARY ARTERY DISEASE (ICD-414.00) DIABETES MELLITUS, TYPE II, UNCONTROLLED (ICD-250.02) HYPERTENSION (ICD-401.9) HYPERLIPIDEMIA (ICD-272.4)    OSTEOARTHRITIS (ICD-715.90) POSTHERPETIC NEURALGIA (ICD-053.19)  HERPES ZOSTER (ICD-053.9)  ADJUSTMENT DISORDER WITH MIXED FEATURES (ICD-309.28) DIABETIC PERIPHERAL NEUROPATHY (ICD-250.60) ERECTILE DYSFUNCTION (ICD-302.72) OBESITY (ICD-278.00) GERD (ICD-530.81)     Past Surgical History: Coronary artery bypass graft in 1996 with a LIMA to the LAD, saphenous vein graft to the acute marginal, saphenous vein graft to the PDA and a saphenous vein graft to the circumflex. Right knee arthroscopic surgery in March of 2006 s/p bilateral knee replacement   Shoulder surgery    Family History: Mother died of coronary artery disease at age 6 Father is alive at age 20 with coronary artery disease Half-brother who has a history of stroke                  Social History: Never Smoked Alcohol use-yes     Married Occupation: truck Hospital doctor, retired              Does Patient Exercise:  no Caffeine use/day:  1-2 beveraage daily  Review of Systems  The patient denies weight loss, chest pain, syncope, dyspnea on exertion, prolonged cough, melena, hematochezia, fever, abdominal pain, and severe indigestion/heartburn.         poor erection quality feet feel numb.  ache at night denies depressive symptoms no change in memory  Physical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   Head:  normocephalic and atraumatic.   Eyes:  pupils equal, pupils round, and pupils reactive to light.   Mouth:  pharynx pink and moist.   Neck:  supple and no carotid bruits.   Lungs:  normal respiratory effort and  normal breath sounds.   Heart:  normal rate, regular rhythm, and no gallop.   Abdomen:  soft, non-tender, normal bowel sounds, no masses, no hepatomegaly, and no splenomegaly.   Rectal:  no external abnormalities.  heme negative Genitalia:  circumcised, no varicocele, no scrotal masses, and no cutaneous lesions.   Prostate:  no nodules, no asymmetry, and 1+ enlarged.   Extremities:  No lower extremity edema Neurologic:  cranial nerves II-XII intact and gait normal.   Psych:  normally interactive, good eye contact, not anxious appearing, and not depressed appearing.     Impression & Recommendations:  Problem # 1:  PREVENTIVE HEALTH CARE  (ICD-V70.0)  Reviewed adult health maintenance protocols.  Colonoscopy: Done (03/20/2000) Td Booster: Historical (03/20/2001)   Flu Vax: Fluvax 3+ (12/06/2009)   Pneumovax: Pneumovax (02/10/2007) Chol: 154 (04/07/2010)   HDL: 44 (04/07/2010)   LDL: 95 (04/07/2010)   TG: 75 (04/07/2010) TSH: 1.400 (11/19/2008)   HgbA1C: 8.3 (04/07/2010)     Orders: EKG w/ Interpretation (93000)  Problem # 2:  HYPERTENSION (ICD-401.9) low pulse with start of bystolic.  reduce to 2.5 mg.  resume prev dose of lisinopril 20 mg  His updated medication list for this problem includes:    Lisinopril 20 Mg Tabs (Lisinopril) ..... One by mouth once daily    Bystolic 2.5 Mg Tabs (Nebivolol hcl) ..... One by mouth once daily  BP today: 140/70 Prior BP: 100/62 (03/09/2010)  Labs Reviewed: K+: 4.6 (04/07/2010) Creat: : 0.86 (04/07/2010)   Chol: 154 (04/07/2010)   HDL: 44 (04/07/2010)   LDL: 95 (04/07/2010)   TG: 75 (04/07/2010)  Future Orders: T-Basic Metabolic Panel [id number]) ... 06/05/2010  Problem # 3:  GERD (ICD-530.81) Assessment: Deteriorated  The following medications were removed from the medication list:    Omeprazole 20 Mg Cpdr (Omeprazole) ..... One by mouth once daily His updated medication list for this problem includes:    Pantoprazole Sodium 40 Mg Tbec (Pantoprazole sodium) ..... One by mouth once daily  Problem # 4:  DIABETIC PERIPHERAL NEUROPATHY (ICD-250.60) Assessment: Improved use gabapentin as directed  His updated medication list for this problem includes:    Metformin Hcl 1000 Mg Tabs (Metformin hcl) ..... One by mouth bid    Lantus Solostar 100 Unit/ml Soln (Insulin glargine) .Marland KitchenMarland KitchenMarland KitchenMarland Kitchen 15 units in am and 10 units in pm subcutaneously    Aspirin Ec 325 Mg Tbec (Aspirin) .[name]... Take one tablet by mouth daily    Lisinopril 20 Mg Tabs (Lisinopril) ..... One by mouth once daily    Apidra Solostar 100 Unit/ml Soln (Insulin glulisine) ..... Use 5 - 10 units subcutaneously   three times  a day before meals  Labs Reviewed: Creat: 0.86 (04/07/2010)     Last Eye Exam: normal (08/12/2009) Reviewed HgBA1c results: 8.3 (04/07/2010)  7.1 (11/30/2009)  Complete Medication List: 1)  Lipitor 80 Mg Tabs (Atorvastatin calcium) .... Take one tablet by mouth daily. 2)  Metformin Hcl 1000 Mg Tabs (Metformin hcl) .... One by mouth bid 3)  Lantus Solostar 100 Unit/ml Soln (Insulin glargine) .[name].. 15 units in am and 10 units in pm subcutaneously 4)  Pen Needles 5/16" 31g X 8 Mm Misc (Insulin pen needle) .... Use as directed 6 x daily 5)  Aspirin Ec 325 Mg Tbec (Aspirin) .... Take one tablet by mouth daily 6)  Lisinopril 20 Mg Tabs (Lisinopril) .... One by mouth once daily 7)  Apidra Solostar 100 Unit/ml Soln (Insulin glulisine) .... Use 5 - 10 units subcutaneously  three times a day before meals 8)  Accu-chek Aviva Strp (Glucose blood) .... For three times a day testing 9)  Miralax Powd (Polyethylene glycol 3350) .Marland KitchenMarland KitchenMarland Kitchen 17 grams mixed with water two times a day 10)  Effient 10 Mg Tabs (Prasugrel hcl) .... Take 1 tablet by mouth once a day 11)  Bupropion Hcl 150 Mg Xr24h-tab (Bupropion hcl) .... One by mouth once daily 12)  Accu-chek Aviva Strp (Glucose blood) .... Use up pt 5 x per day as directed 13)  Accu-chek Multiclix Lancets Misc (Lancets) .... Use up to 5 x daily as directed 14)  Bystolic 2.5 Mg Tabs (Nebivolol hcl) .... One by mouth once daily 15)  Pantoprazole Sodium 40 Mg Tbec (Pantoprazole sodium) .... One by mouth once daily 16)  Gabapentin 300 Mg Caps (Gabapentin) .... One by mouth at bedtime as needed for neuropathy pain  Other Orders: Future Orders: T- Hemoglobin A1C ([id number]) ... 06/05/2010  Patient Instructions: 1)  Please schedule a follow-up appointment in 3 months. 2)  BMP prior to visit, ICD-9:  401.9 3)  HbgA1C prior to visit, ICD-9: 250.02 4)  Please return for lab work one (1) week before your next appointment.  Prescriptions: GABAPENTIN 300 MG CAPS  (GABAPENTIN) one by mouth at bedtime as needed for neuropathy pain  #90 x 1   Entered and Authorized by:   D. Thomos Lemons DO   Signed by:   D. Thomos Lemons DO on 04/11/2010   Method used:   Print then Give to Patient   RxID:   [id number] ACCU-CHEK AVIVA  STRP (GLUCOSE BLOOD) use up pt 5 x per day as directed  #300 x 3   Entered and Authorized by:   D. Thomos Lemons DO   Signed by:   D. Thomos Lemons DO on 04/11/2010   Method used:   Print then Give to Patient   RxID:   [id number] BYSTOLIC 2.5 MG TABS (NEBIVOLOL HCL) one by mouth once daily  #90 x 1   Entered and Authorized by:   D. Thomos Lemons DO   Signed by:   D. Thomos Lemons DO on 04/11/2010   Method used:   Print then Give to Patient   RxID:   [id number] LISINOPRIL 20 MG TABS (LISINOPRIL) one by mouth once daily  #90 x 1   Entered and Authorized by:   D. Thomos Lemons DO   Signed by:   D. Thomos Lemons DO on 04/11/2010   Method used:   Print then Give to Patient   RxID:   [id number] PANTOPRAZOLE SODIUM 40 MG TBEC (PANTOPRAZOLE SODIUM) one by mouth once daily  #90 x 3   Entered and Authorized by:   D. Thomos Lemons DO   Signed by:   D. Thomos Lemons DO on 04/11/2010   Method used:   Print then Give to Patient   RxID:   [id number]    Orders Added: 1)  EKG w/ Interpretation [93000] 2)  T-Basic Metabolic Panel [[id number]] 3)  T- Hemoglobin A1C [83036-23375] 4)  Est. Patient 65& > [99397] 5)  Est. Patient Level III [30160]     Current Allergies (reviewed today): No known allergies

## 2010-05-05 ENCOUNTER — Ambulatory Visit (INDEPENDENT_AMBULATORY_CARE_PROVIDER_SITE_OTHER): Payer: Medicare Other | Admitting: Internal Medicine

## 2010-05-05 ENCOUNTER — Encounter: Payer: Self-pay | Admitting: Internal Medicine

## 2010-05-05 DIAGNOSIS — S335XXA Sprain of ligaments of lumbar spine, initial encounter: Secondary | ICD-10-CM

## 2010-05-05 DIAGNOSIS — M549 Dorsalgia, unspecified: Secondary | ICD-10-CM

## 2010-05-05 LAB — CONVERTED CEMR LAB
Bilirubin Urine: NEGATIVE
Blood in Urine, dipstick: NEGATIVE
Glucose, Urine, Semiquant: NEGATIVE
Ketones, urine, test strip: NEGATIVE
Nitrite: NEGATIVE
Specific Gravity, Urine: 1.01
Urobilinogen, UA: 0.2
WBC Urine, dipstick: NEGATIVE
pH: 5

## 2010-05-10 ENCOUNTER — Telehealth (INDEPENDENT_AMBULATORY_CARE_PROVIDER_SITE_OTHER): Payer: Self-pay | Admitting: *Deleted

## 2010-05-10 NOTE — Progress Notes (Signed)
Summary: refill-metformin  Phone Note Refill Request Message from:  Fax from Pharmacy on May 04, 2010 9:05 AM  Refills Requested: Medication #1:  METFORMIN HCL 1000 MG  TABS one by mouth bid   Dosage confirmed as above?Dosage Confirmed   Brand Name Necessary? No   Supply Requested: 3 months   Last Ref[facility]: [facility] [address] [place] [address] fax [phone]   Method Requested: Electronic Next Appointment Scheduled: none Initial call taken by: Elba Barman,  May 04, 2010 9:07 AM  Follow-up for Phone Call        Rx completed in Dr. Tiajuana Amass Follow-up by: Glendell Docker CMA,  May 04, 2010 9:09 AM    Prescriptions: METFORMIN HCL 1000 MG  TABS (METFORMIN HCL) one by mouth bid  #180 x 1   Entered by:   Glendell Docker CMA   Authorized by:   D. Thomos Lemons DO   Signed by:   Glendell Docker CMA on 05/04/2010   Method used:   Electronically to        [facility] [address] (retail)       [address]       [place], [place]  [address]       Ph: [phone]       Fax: [phone]   RxID:   [id number]

## 2010-05-10 NOTE — Cardiovascular Report (Addendum)
Summary: [facility] Cardiac Cath   [facility] Cardiac Cath   Imported By: Earl Many 05/01/2010 10:11:03  _____________________________________________________________________  External Attachment:    Type:   Image     Comment:   External Document  Appended Document: [facility] Cardiac Cath  The procedure was successful.  I am not sure why it said otherwise.  TS>

## 2010-05-15 ENCOUNTER — Ambulatory Visit: Payer: Medicare Other | Admitting: Internal Medicine

## 2010-05-16 NOTE — Progress Notes (Signed)
Summary: ? download<<<order sent  Phone Note Call from Patient Call back at Home Phone [phone]   Caller: Spouse/[name] Call For: ALVA Summary of Call: Patients spouse [name] phoned stated that they saw Dr Vassie Loll in Jan and they told him to try a chin strap because he was losing air and stated that if the chin strap didnt work they could try the full mask. The chin strap isnt working and they would like to try the full mask also she had to call [facility] becasue his machine is not working. They need a prescription for the full mask sent to [facility]. she can be reached at [phone] Initial call taken by: Vedia Coffer,  May 10, 2010 11:43 AM  Follow-up for Phone Call        Pt wants to know if [facility] needs to send a sd card for RA to download. Follow-up by: Darletta Moll,  May 10, 2010 12:00 PM  Additional Follow-up for Phone Call Additional follow up Details #1::        Called, spoke with [name].  Per [name], pt trialed chin strap but did not work.  States [facility] scheduled to come out on March 2 but she thinks they need an order for the full face mask.  Also, would like to know if RA wants a download to see if pressure needs to be adjusted.  Per orders from Apr 18, 2010, order was placed to [facility] to trial chin strap and if did not work or persistant leaks try full face mask.  Called [facility], spoke with Robin.  States pt scheduled for mask refit on March 2, and they do have order from Jan 31.  No orders or anything needed from our office on pt at this time.  Dr. Vassie Loll, pls advise if you would like download on pt.  Thanks!  Additional Follow-up by: Gweneth Dimitri RN,  May 10, 2010 2:20 PM    Additional Follow-up for Phone Call Additional follow up Details #2::    yes pl, download  Follow-up by: Comer Locket. Vassie Loll MD,  May 10, 2010 9:15 PM  Additional Follow-up for Phone Call Additional follow up Details #3:: Details for  Additional Follow-up Action Taken: order for 4 week downlosd sent to [facility] and asked that this be done after pt receives new full face mask. Additional Follow-up by: Michel Bickers CMA,  May 11, 2010 10:51 AM

## 2010-05-25 NOTE — Assessment & Plan Note (Signed)
Summary: lower back pain/ss   Vital Signs:  Patient profile:   65 year old male Height:      74 inches Weight:      249.50 pounds BMI:     32.15 O2 Sat:      97 % on Room air Temp:     97.5 degrees F oral Pulse rate:   69 / minute Resp:     20 per minute BP sitting:   142 / 80  (right arm) Cuff size:   large  Vitals Entered By: Glendell Docker CMA (May 05, 2010 10:48 AM)  O2 Flow:  Room air CC: Back Pain Is Patient Diabetic? Yes Pain Assessment Patient in pain? yes     Location: lower back Intensity: 9 Type: Throbbing Onset of pain  Constant   Primary Care Provider:  Dondra Spry DO  CC:  Back Pain.  History of Present Illness:  65 year old male complains of low back pain onset 3-4 days ago Denies specific injury or trauma Patient describes sharp intense intermittent pain He was seen by his chiropractor twice this week but was unable to get relief with spinal manipulation pain is worse with standing and walking Mild improvement with laying on his back  He denies fever or chills Denies urinary symptoms   Preventive Screening-Counseling & Management  Alcohol-Tobacco     Smoking Status: never  Allergies (verified): No Known Drug Allergies  Past History:  Past Medical History: CORONARY ARTERY DISEASE (ICD-414.00) DIABETES MELLITUS, TYPE II, UNCONTROLLED (ICD-250.02) HYPERTENSION (ICD-401.9) HYPERLIPIDEMIA (ICD-272.4)    OSTEOARTHRITIS (ICD-715.90) POSTHERPETIC NEURALGIA (ICD-053.19)   HERPES ZOSTER (ICD-053.9)  ADJUSTMENT DISORDER WITH MIXED FEATURES (ICD-309.28) DIABETIC PERIPHERAL NEUROPATHY (ICD-250.60) ERECTILE DYSFUNCTION (ICD-302.72) OBESITY (ICD-278.00) GERD (ICD-530.81)    Past Surgical History: Coronary artery bypass graft in 1996 with a LIMA to the LAD, saphenous vein graft to the acute marginal, saphenous vein graft to the PDA and a saphenous vein graft to the circumflex. Right knee arthroscopic surgery in March of 2006 s/p  bilateral knee replacement   Shoulder surgery     Physical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   Lungs:  normal respiratory effort and normal breath sounds.   Heart:  normal rate, regular rhythm, and no gallop.   Neurologic:  cranial nerves II-XII intact, gait normal, and DTRs symmetrical and normal.     Impression & Recommendations:  Problem # 1:  LUMBAR STRAIN, ACUTE (ICD-847.2) possible discogenic pain versus pulled muscles He has not responded well to narcotics in the past Avoid chiropractic treatments for now Use muscle relaxers as directed  Complete Medication List: 1)  Lipitor 80 Mg Tabs (Atorvastatin calcium) .... Take one tablet by mouth daily. 2)  Metformin Hcl 1000 Mg Tabs (Metformin hcl) .... One by mouth bid 3)  Lantus Solostar 100 Unit/ml Soln (Insulin glargine) .[name].. 15 units in am and 10 units in pm subcutaneously 4)  Pen Needles 5/16" 31g X 8 Mm Misc (Insulin pen needle) .... Use as directed 6 x daily 5)  Aspirin Ec 325 Mg Tbec (Aspirin) .... Take one tablet by mouth daily 6)  Lisinopril-hydrochlorothiazide 20-12.5 Mg Tabs (Lisinopril-hydrochlorothiazide) .... One by mouth once daily 7)  Apidra Solostar 100 Unit/ml Soln (Insulin glulisine) .... Use 5 - 10 units subcutaneously   three times a day before meals 8)  Accu-chek Aviva Strp (Glucose blood) .... For three times a day testing 9)  Miralax Powd (Polyethylene glycol 3350) .Marland KitchenMarland KitchenMarland Kitchen 17 grams mixed with water two times a day 10)  Effient 10 Mg Tabs (Prasugrel hcl) .... Take 1 tablet by mouth once a day 11)  Bupropion Hcl 150 Mg Xr24h-tab (Bupropion hcl) .... One by mouth once daily 12)  Accu-chek Aviva Strp (Glucose blood) .... Use up pt 5 x per day as directed 13)  Accu-chek Multiclix Lancets Misc (Lancets) .... Use up to 5 x daily as directed 14)  Bystolic 2.5 Mg Tabs (Nebivolol hcl) .... One by mouth once daily 15)  Pantoprazole Sodium 40 Mg Tbec (Pantoprazole sodium) .... One by mouth once  daily 16)  Gabapentin 300 Mg Caps (Gabapentin) .... One by mouth at bedtime as needed for neuropathy pain 17)  Cyclobenzaprine Hcl 10 Mg Tabs (Cyclobenzaprine hcl) .... One by mouth two times a day as needed  Other Orders: UA Dipstick W/ Micro (manual) (47829)  Patient Instructions: 1)  Call our office if your symptoms do not  improve or gets worse. Prescriptions: CYCLOBENZAPRINE HCL 10 MG TABS (CYCLOBENZAPRINE HCL) one by mouth two times a day as needed  #30 x 0   Entered and Authorized by:   D. Thomos Lemons DO   Signed by:   D. Thomos Lemons DO on 05/05/2010   Method used:   Print then Give to Patient   RxID:   [id number]    Orders Added: 1)  UA Dipstick W/ Micro (manual) [81000] 2)  Est. Patient Level III [95284]    Current Allergies (reviewed today): No known allergies    Laboratory Results   Urine Tests    Routine Urinalysis   Color: straw Appearance: Clear Glucose: negative   (Normal Range: Negative) Bilirubin: negative   (Normal Range: Negative) Ketone: negative   (Normal Range: Negative) Spec. Gravity: 1.010   (Normal Range: 1.003-1.035) Blood: negative   (Normal Range: Negative) pH: 5.0   (Normal Range: 5.0-8.0) Protein: trace   (Normal Range: Negative) Urobilinogen: 0.2   (Normal Range: 0-1) Nitrite: negative   (Normal Range: Negative) Leukocyte Esterace: negative   (Normal Range: Negative)

## 2010-05-25 NOTE — Assessment & Plan Note (Signed)
Summary: elevated bp/ss   Vital Signs:  Patient profile:   65 year old male Height:      74 inches Weight:      252 pounds BMI:     32.47 O2 Sat:      97 % on Room air Temp:     97.6 degrees F oral Pulse rate:   60 / minute Resp:     18 per minute BP sitting:   150 / 80  (right arm) Cuff size:   large  Vitals Entered By: Glendell Docker CMA (May 03, 2010 9:54 AM)  O2 Flow:  Room air CC: Elevated Blood Pressure Is Patient Diabetic? Yes Pain Assessment Patient in pain? no        Primary Care Provider:  DThomos Lemons DO  CC:  Elevated Blood Pressure.  History of Present Illness: 65 y/o male reports BP higher than normal recently no new meds denies OTC supplements   Preventive Screening-Counseling & Management  Alcohol-Tobacco     Smoking Status: never  Allergies (verified): No Known Drug Allergies  Past History:  Past Medical History: CORONARY ARTERY DISEASE (ICD-414.00) DIABETES MELLITUS, TYPE II, UNCONTROLLED (ICD-250.02) HYPERTENSION (ICD-401.9) HYPERLIPIDEMIA (ICD-272.4)     OSTEOARTHRITIS (ICD-715.90) POSTHERPETIC NEURALGIA (ICD-053.19)  HERPES ZOSTER (ICD-053.9)  ADJUSTMENT DISORDER WITH MIXED FEATURES (ICD-309.28) DIABETIC PERIPHERAL NEUROPATHY (ICD-250.60) ERECTILE DYSFUNCTION (ICD-302.72) OBESITY (ICD-278.00) GERD (ICD-530.81)    Physical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   Lungs:  normal respiratory effort and normal breath sounds.   Heart:  normal rate, regular rhythm, and no gallop.   Neurologic:  cranial nerves II-XII intact and gait normal.     Impression & Recommendations:  Problem # 1:  HYPERTENSION (ICD-401.9) Assessment Deteriorated add diuretic  His updated medication list for this problem includes:    Lisinopril-hydrochlorothiazide 20-12.5 Mg Tabs (Lisinopril-hydrochlorothiazide) ..... One by mouth once daily    Bystolic 2.5 Mg Tabs (Nebivolol hcl) ..... One by mouth once daily  BP today:  150/80 Prior BP: 148/76 (04/19/2010)  Labs Reviewed: K+: 4.6 (04/07/2010) Creat: : 0.86 (04/07/2010)   Chol: 154 (04/07/2010)   HDL: 44 (04/07/2010)   LDL: 95 (04/07/2010)   TG: 75 (04/07/2010)  Complete Medication List: 1)  Lipitor 80 Mg Tabs (Atorvastatin calcium) .... Take one tablet by mouth daily. 2)  Metformin Hcl 1000 Mg Tabs (Metformin hcl) .... One by mouth bid 3)  Lantus Solostar 100 Unit/ml Soln (Insulin glargine) .[name].. 15 units in am and 10 units in pm subcutaneously 4)  Pen Needles 5/16" 31g X 8 Mm Misc (Insulin pen needle) .... Use as directed 6 x daily 5)  Aspirin Ec 325 Mg Tbec (Aspirin) .... Take one tablet by mouth daily 6)  Lisinopril-hydrochlorothiazide 20-12.5 Mg Tabs (Lisinopril-hydrochlorothiazide) .... One by mouth once daily 7)  Apidra Solostar 100 Unit/ml Soln (Insulin glulisine) .... Use 5 - 10 units subcutaneously   three times a day before meals 8)  Accu-chek Aviva Strp (Glucose blood) .... For three times a day testing 9)  Miralax Powd (Polyethylene glycol 3350) .Marland KitchenMarland KitchenMarland Kitchen 17 grams mixed with water two times a day 10)  Effient 10 Mg Tabs (Prasugrel hcl) .... Take 1 tablet by mouth once a day 11)  Bupropion Hcl 150 Mg Xr24h-tab (Bupropion hcl) .... One by mouth once daily 12)  Accu-chek Aviva Strp (Glucose blood) .... Use up pt 5 x per day as directed 13)  Accu-chek Multiclix Lancets Misc (Lancets) .... Use up to 5 x daily as directed 14)  Bystolic  2.5 Mg Tabs (Nebivolol hcl) .... One by mouth once daily 15)  Pantoprazole Sodium 40 Mg Tbec (Pantoprazole sodium) .... One by mouth once daily 16)  Gabapentin 300 Mg Caps (Gabapentin) .... One by mouth at bedtime as needed for neuropathy pain 17)  Cyclobenzaprine Hcl 10 Mg Tabs (Cyclobenzaprine hcl) .... One by mouth two times a day as needed  Patient Instructions: 1)  Please schedule a follow-up appointment in 1 month. 2)  BMP prior to visit, ICD-9:  401.9 3)  Please return for lab work one (1) week before your next  appointment.  Prescriptions: LISINOPRIL-HYDROCHLOROTHIAZIDE 20-12.5 MG TABS (LISINOPRIL-HYDROCHLOROTHIAZIDE) one by mouth once daily  #30 x 1   Entered and Authorized by:   D. Thomos Lemons DO   Signed by:   D. Thomos Lemons DO on 05/03/2010   Method used:   Print then Give to Patient   RxID:   [id number]    Orders Added: 1)  Est. Patient Level III [14782]    Current Allergies (reviewed today): No known allergies

## 2010-05-29 ENCOUNTER — Telehealth: Payer: Self-pay | Admitting: Pulmonary Disease

## 2010-05-29 ENCOUNTER — Encounter: Payer: Self-pay | Admitting: Internal Medicine

## 2010-05-29 LAB — BASIC METABOLIC PANEL WITH GFR
BUN: 11 mg/dL (ref 6–23)
CO2: 27 meq/L (ref 19–32)
Calcium: 8.8 mg/dL (ref 8.4–10.5)
Chloride: 107 meq/L (ref 96–112)
Creatinine, Ser: 0.94 mg/dL (ref 0.4–1.5)
GFR calc non Af Amer: >60 mL/min
Glucose, Bld: 314 mg/dL — ABNORMAL HIGH (ref 70–99)
Potassium: 4.1 meq/L (ref 3.5–5.1)
Sodium: 139 meq/L (ref 135–145)

## 2010-05-29 LAB — BASIC METABOLIC PANEL
BUN: 10 mg/dL (ref 6–23)
CO2: 30 mEq/L (ref 19–32)
Calcium: 9.2 mg/dL (ref 8.4–10.5)
Chloride: 106 mEq/L (ref 96–112)
Creatinine, Ser: 0.84 mg/dL (ref 0.4–1.5)
GFR calc Af Amer: >60 mL/min (ref >60)
GFR calc non Af Amer: >60 mL/min (ref >60)
Glucose, Bld: 194 mg/dL — ABNORMAL HIGH (ref 70–99)
Potassium: 4.2 mEq/L (ref 3.5–5.1)
Sodium: 141 mEq/L (ref 135–145)

## 2010-05-29 LAB — CBC
HCT: 41 % (ref 39.0–52.0)
Hemoglobin: 13.7 g/dL (ref 13.0–17.0)
MCH: 31.1 pg (ref 26.0–34.0)
MCHC: 33.4 g/dL (ref 30.0–36.0)
MCV: 93.2 fL (ref 78.0–100.0)
Platelets: 125 K/uL — ABNORMAL LOW (ref 150–400)
RBC: 4.4 MIL/uL (ref 4.22–5.81)
RDW: 13.9 % (ref 11.5–15.5)
WBC: 6.7 K/uL (ref 4.0–10.5)

## 2010-05-29 LAB — GLUCOSE, CAPILLARY
Glucose-Capillary: 147 mg/dL — ABNORMAL HIGH (ref 70–99)
Glucose-Capillary: 166 mg/dL — ABNORMAL HIGH (ref 70–99)
Glucose-Capillary: 172 mg/dL — ABNORMAL HIGH (ref 70–99)
Glucose-Capillary: 188 mg/dL — ABNORMAL HIGH (ref 70–99)
Glucose-Capillary: 225 mg/dL — ABNORMAL HIGH (ref 70–99)
Glucose-Capillary: 233 mg/dL — ABNORMAL HIGH (ref 70–99)
Glucose-Capillary: 249 mg/dL — ABNORMAL HIGH (ref 70–99)
Glucose-Capillary: 258 mg/dL — ABNORMAL HIGH (ref 70–99)
Glucose-Capillary: 290 mg/dL — ABNORMAL HIGH (ref 70–99)
Glucose-Capillary: 292 mg/dL — ABNORMAL HIGH (ref 70–99)
Glucose-Capillary: 311 mg/dL — ABNORMAL HIGH (ref 70–99)

## 2010-05-29 LAB — POCT I-STAT GLUCOSE
Glucose, Bld: 153 mg/dL — ABNORMAL HIGH (ref 70–99)
Operator id: 141321

## 2010-05-30 NOTE — Miscellaneous (Signed)
Summary: [facility] Cardiac Progress Report   [facility] Cardiac Progress Report   Imported By: Roderic Ovens 05/23/2010 15:42:39  _____________________________________________________________________  External Attachment:    Type:   Image     Comment:   External Document

## 2010-05-31 ENCOUNTER — Ambulatory Visit: Payer: Self-pay | Admitting: Cardiology

## 2010-05-31 LAB — GLUCOSE, CAPILLARY
Glucose-Capillary: 173 mg/dL — ABNORMAL HIGH (ref 70–99)
Glucose-Capillary: 231 mg/dL — ABNORMAL HIGH (ref 70–99)
Glucose-Capillary: 172 mg/dL — ABNORMAL HIGH (ref 70–99)

## 2010-06-01 LAB — GLUCOSE, CAPILLARY
Glucose-Capillary: 141 mg/dL — ABNORMAL HIGH (ref 70–99)
Glucose-Capillary: 171 mg/dL — ABNORMAL HIGH (ref 70–99)
Glucose-Capillary: 207 mg/dL — ABNORMAL HIGH (ref 70–99)
Glucose-Capillary: 210 mg/dL — ABNORMAL HIGH (ref 70–99)
Glucose-Capillary: 235 mg/dL — ABNORMAL HIGH (ref 70–99)
Glucose-Capillary: 120 mg/dL — ABNORMAL HIGH (ref 70–99)
Glucose-Capillary: 122 mg/dL — ABNORMAL HIGH (ref 70–99)
Glucose-Capillary: 158 mg/dL — ABNORMAL HIGH (ref 70–99)
Glucose-Capillary: 168 mg/dL — ABNORMAL HIGH (ref 70–99)
Glucose-Capillary: 172 mg/dL — ABNORMAL HIGH (ref 70–99)
Glucose-Capillary: 173 mg/dL — ABNORMAL HIGH (ref 70–99)
Glucose-Capillary: 176 mg/dL — ABNORMAL HIGH (ref 70–99)

## 2010-06-02 ENCOUNTER — Ambulatory Visit: Payer: Medicare Other | Admitting: Internal Medicine

## 2010-06-02 LAB — GLUCOSE, CAPILLARY
Glucose-Capillary: 105 mg/dL — ABNORMAL HIGH (ref 70–99)
Glucose-Capillary: 165 mg/dL — ABNORMAL HIGH (ref 70–99)
Glucose-Capillary: 179 mg/dL — ABNORMAL HIGH (ref 70–99)
Glucose-Capillary: 62 mg/dL — ABNORMAL LOW (ref 70–99)
Glucose-Capillary: 94 mg/dL (ref 70–99)

## 2010-06-02 LAB — CONVERTED CEMR LAB
BUN: 17 mg/dL (ref 6–23)
CO2: 29 meq/L (ref 19–32)
Calcium: 9.2 mg/dL (ref 8.4–10.5)
Chloride: 104 meq/L (ref 96–112)
Creatinine, Ser: 0.93 mg/dL (ref 0.40–1.50)
Glucose, Bld: 132 mg/dL — ABNORMAL HIGH (ref 70–99)
Hgb A1c MFr Bld: 8.2 % — ABNORMAL HIGH (ref <5.7)
Potassium: 4 meq/L (ref 3.5–5.3)
Sodium: 141 meq/L (ref 135–145)

## 2010-06-03 LAB — GLUCOSE, CAPILLARY
Glucose-Capillary: 128 mg/dL — ABNORMAL HIGH (ref 70–99)
Glucose-Capillary: 157 mg/dL — ABNORMAL HIGH (ref 70–99)
Glucose-Capillary: 210 mg/dL — ABNORMAL HIGH (ref 70–99)
Glucose-Capillary: 82 mg/dL (ref 70–99)
Glucose-Capillary: 94 mg/dL (ref 70–99)
Glucose-Capillary: 94 mg/dL (ref 70–99)
Glucose-Capillary: 95 mg/dL (ref 70–99)

## 2010-06-04 LAB — GLUCOSE, CAPILLARY
Glucose-Capillary: 108 mg/dL — ABNORMAL HIGH (ref 70–99)
Glucose-Capillary: 108 mg/dL — ABNORMAL HIGH (ref 70–99)
Glucose-Capillary: 114 mg/dL — ABNORMAL HIGH (ref 70–99)
Glucose-Capillary: 223 mg/dL — ABNORMAL HIGH (ref 70–99)
Glucose-Capillary: 224 mg/dL — ABNORMAL HIGH (ref 70–99)
Glucose-Capillary: 251 mg/dL — ABNORMAL HIGH (ref 70–99)
Glucose-Capillary: 243 mg/dL — ABNORMAL HIGH (ref 70–99)
Glucose-Capillary: 299 mg/dL — ABNORMAL HIGH (ref 70–99)

## 2010-06-05 ENCOUNTER — Ambulatory Visit (INDEPENDENT_AMBULATORY_CARE_PROVIDER_SITE_OTHER): Payer: Medicare Other | Admitting: Internal Medicine

## 2010-06-05 ENCOUNTER — Ambulatory Visit (HOSPITAL_BASED_OUTPATIENT_CLINIC_OR_DEPARTMENT_OTHER)
Admission: RE | Admit: 2010-06-05 | Discharge: 2010-06-05 | Disposition: A | Discharge status: Home / Self Care | Payer: Medicare Other | Source: Ambulatory Visit | Attending: Internal Medicine | Admitting: Internal Medicine

## 2010-06-05 ENCOUNTER — Telehealth: Payer: Self-pay | Admitting: Internal Medicine

## 2010-06-05 ENCOUNTER — Other Ambulatory Visit: Payer: Self-pay | Admitting: Internal Medicine

## 2010-06-05 DIAGNOSIS — M25519 Pain in unspecified shoulder: Secondary | ICD-10-CM

## 2010-06-05 DIAGNOSIS — G4733 Obstructive sleep apnea (adult) (pediatric): Secondary | ICD-10-CM

## 2010-06-05 DIAGNOSIS — I10 Essential (primary) hypertension: Secondary | ICD-10-CM

## 2010-06-05 LAB — CBC
HCT: 39.4 % (ref 39.0–52.0)
Hemoglobin: 13.9 g/dL (ref 13.0–17.0)
MCHC: 35.2 g/dL (ref 30.0–36.0)
MCV: 103 fL — ABNORMAL HIGH (ref 78.0–100.0)
Platelets: 119 10*3/uL — ABNORMAL LOW (ref 150–400)
RBC: 3.82 MIL/uL — ABNORMAL LOW (ref 4.22–5.81)
RDW: 13.5 % (ref 11.5–15.5)
WBC: 5.6 10*3/uL (ref 4.0–10.5)

## 2010-06-05 LAB — POCT I-STAT 3, ART BLOOD GAS (G3+)
Acid-Base Excess: 1 mmol/L (ref 0.0–2.0)
Bicarbonate: 26.3 mEq/L — ABNORMAL HIGH (ref 20.0–24.0)
O2 Saturation: 96 %
TCO2: 28 mmol/L (ref 0–100)
pCO2 arterial: 41.5 mmHg (ref 35.0–45.0)
pH, Arterial: 7.41 (ref 7.350–7.450)
pO2, Arterial: 83 mmHg (ref 80.0–100.0)

## 2010-06-05 LAB — GLUCOSE, CAPILLARY
Glucose-Capillary: 121 mg/dL — ABNORMAL HIGH (ref 70–99)
Glucose-Capillary: 159 mg/dL — ABNORMAL HIGH (ref 70–99)
Glucose-Capillary: 279 mg/dL — ABNORMAL HIGH (ref 70–99)

## 2010-06-05 LAB — MRSA PCR SCREENING: MRSA by PCR: NEGATIVE

## 2010-06-05 LAB — PLATELET INHIBITION P2Y12
P2Y12 % Inhibition: 6 %
Platelet Function  P2Y12: 274 [PRU] (ref 194–418)
Platelet Function Baseline: 291 [PRU] (ref 194–418)

## 2010-06-05 LAB — POCT I-STAT 3, VENOUS BLOOD GAS (G3P V)
Acid-Base Excess: 3 mmol/L — ABNORMAL HIGH (ref 0.0–2.0)
Bicarbonate: 28.8 mEq/L — ABNORMAL HIGH (ref 20.0–24.0)
O2 Saturation: 65 %
TCO2: 30 mmol/L (ref 0–100)
pCO2, Ven: 48.8 mmHg (ref 45.0–50.0)
pH, Ven: 7.379 — ABNORMAL HIGH (ref 7.250–7.300)
pO2, Ven: 35 mmHg (ref 30.0–45.0)

## 2010-06-05 LAB — BASIC METABOLIC PANEL
BUN: 12 mg/dL (ref 6–23)
CO2: 27 mEq/L (ref 19–32)
Calcium: 8.6 mg/dL (ref 8.4–10.5)
Chloride: 108 mEq/L (ref 96–112)
Creatinine, Ser: 0.79 mg/dL (ref 0.4–1.5)
GFR calc Af Amer: >60 mL/min (ref >60)
GFR calc non Af Amer: >60 mL/min (ref >60)
Glucose, Bld: 244 mg/dL — ABNORMAL HIGH (ref 70–99)
Potassium: 3.8 mEq/L (ref 3.5–5.1)
Sodium: 139 mEq/L (ref 135–145)

## 2010-06-05 LAB — CARDIAC PANEL(CRET KIN+CKTOT+MB+TROPI)
CK, MB: 4.6 ng/mL — ABNORMAL HIGH (ref 0.3–4.0)
Relative Index: 3.2 — ABNORMAL HIGH (ref 0.0–2.5)
Total CK: 142 U/L (ref 7–232)
Troponin I: 0.03 ng/mL (ref 0.00–0.06)

## 2010-06-05 LAB — POCT I-STAT GLUCOSE
Glucose, Bld: 141 mg/dL — ABNORMAL HIGH (ref 70–99)
Operator id: 122531

## 2010-06-06 NOTE — Progress Notes (Signed)
Summary: mask issues  Phone Note Outgoing Call   Summary of Call: Called pt to schedule follow up in [place]. Pt states received new full face mask March 2nd. c/o not sleeping well since, dry mouth is worse, straps are not adjusting correctly, hard to drink water at night for the dry mouth. States has a humidifier on same set on 83, still no help. Pt scheduled to come in April 10 in [place] for follow. Please advise. Thanks. Initial call taken by: Zackery Barefoot CMA,  May 29, 2010 9:11 AM  Follow-up for Phone Call        Options include -make appt with sleep lab for full face mask fit at NO additional cost  -go back to nasal mask Follow-up by: Comer Locket. Vassie Loll MD,  May 29, 2010 5:25 PM  Additional Follow-up for Phone Call Additional follow up Details #1::        Pt would like to try going to sleep lab for fit. States can not do today and later in the day this week will be fine. Zackery Barefoot CMA  May 30, 2010 10:10 AM     Additional Follow-up for Phone Call Additional follow up Details #2::    Pt scheduled for 2pm. pt's spouse informed. Zackery Barefoot CMA  May 30, 2010 4:47 PM

## 2010-06-12 ENCOUNTER — Ambulatory Visit: Payer: Self-pay | Admitting: Internal Medicine

## 2010-06-15 NOTE — Progress Notes (Signed)
Summary: Xray Results  Phone Note Outgoing Call   Summary of Call: call pt - x Hatler of shoulder negative for acute changes.  it shows evidence of arthritis and tendinitis  I suggest he follows up with orthopedic physician.  does he want to go back to [facility] or she physician in [place]? Initial call taken by: D. Thomos Lemons DO,  June 05, 2010 5:31 PM  Follow-up for Phone Call        call placed to patient at [phone], patient/wife informed per Dr Artist Pais instructions. Patient states that he had his knee surgery done with [facility] and would like to be scheduled with them. He was informed the appointment would be arranged by our office and he would be called with the appointment date and time Follow-up by: Glendell Docker CMA,  June 06, 2010 9:02 AM  Additional Follow-up for Phone Call Additional follow up Details #1::        Appt  Tomasita Crumble  DR  [facility]  March  29 th pt aware  Darral Dash  June 08, 2010 8:15 AM  Additional Follow-up by: Darral Dash,  June 08, 2010 8:15 AM

## 2010-06-16 ENCOUNTER — Telehealth: Payer: Self-pay | Admitting: *Deleted

## 2010-06-16 NOTE — Telephone Encounter (Signed)
Patients wife called and left voice message stating patient was seen by Dr Juliene Pina with [facility] C[facility]ble and was given a rx for Indocin 25 mg  to take twice a day for his tendonitis in his shoulder . He was advised to check with Dr Artist Pais to see if it was ok to take the medication.

## 2010-06-20 ENCOUNTER — Other Ambulatory Visit (INDEPENDENT_AMBULATORY_CARE_PROVIDER_SITE_OTHER): Payer: Medicare Other | Admitting: Internal Medicine

## 2010-06-20 DIAGNOSIS — E785 Hyperlipidemia, unspecified: Secondary | ICD-10-CM

## 2010-06-21 ENCOUNTER — Ambulatory Visit: Payer: Medicare Other | Attending: Orthopedic Surgery | Admitting: Physical Therapy

## 2010-06-21 DIAGNOSIS — M25619 Stiffness of unspecified shoulder, not elsewhere classified: Secondary | ICD-10-CM | POA: Insufficient documentation

## 2010-06-21 DIAGNOSIS — R293 Abnormal posture: Secondary | ICD-10-CM | POA: Insufficient documentation

## 2010-06-21 DIAGNOSIS — R5381 Other malaise: Secondary | ICD-10-CM | POA: Insufficient documentation

## 2010-06-21 DIAGNOSIS — M25519 Pain in unspecified shoulder: Secondary | ICD-10-CM | POA: Insufficient documentation

## 2010-06-21 DIAGNOSIS — IMO0001 Reserved for inherently not codable concepts without codable children: Secondary | ICD-10-CM | POA: Insufficient documentation

## 2010-06-21 DIAGNOSIS — Z96659 Presence of unspecified artificial knee joint: Secondary | ICD-10-CM | POA: Insufficient documentation

## 2010-06-21 LAB — URINALYSIS, ROUTINE W REFLEX MICROSCOPIC
Bilirubin Urine: NEGATIVE
Glucose, UA: >1000 mg/dL — AB
Hgb urine dipstick: NEGATIVE
Ketones, ur: NEGATIVE mg/dL
Leukocytes, UA: NEGATIVE
Nitrite: NEGATIVE
Protein, ur: NEGATIVE mg/dL
Specific Gravity, Urine: 1.035 — ABNORMAL HIGH (ref 1.005–1.030)
Urobilinogen, UA: 0.2 mg/dL (ref 0.0–1.0)
pH: 5.5 (ref 5.0–8.0)

## 2010-06-21 LAB — GLUCOSE, CAPILLARY
Glucose-Capillary: 198 mg/dL — ABNORMAL HIGH (ref 70–99)
Glucose-Capillary: 313 mg/dL — ABNORMAL HIGH (ref 70–99)
Glucose-Capillary: 346 mg/dL — ABNORMAL HIGH (ref 70–99)

## 2010-06-21 LAB — COMPREHENSIVE METABOLIC PANEL
ALT: 39 U/L (ref 0–53)
AST: 23 U/L (ref 0–37)
Albumin: 3.4 g/dL — ABNORMAL LOW (ref 3.5–5.2)
Alkaline Phosphatase: 98 U/L (ref 39–117)
BUN: 17 mg/dL (ref 6–23)
CO2: 26 mEq/L (ref 19–32)
Calcium: 9.2 mg/dL (ref 8.4–10.5)
Chloride: 101 mEq/L (ref 96–112)
Creatinine, Ser: 0.73 mg/dL (ref 0.4–1.5)
GFR calc Af Amer: >60 mL/min (ref >60)
GFR calc non Af Amer: >60 mL/min (ref >60)
Glucose, Bld: 362 mg/dL — ABNORMAL HIGH (ref 70–99)
Potassium: 4.3 mEq/L (ref 3.5–5.1)
Sodium: 135 mEq/L (ref 135–145)
Total Bilirubin: 0.4 mg/dL (ref 0.3–1.2)
Total Protein: 6.2 g/dL (ref 6.0–8.3)

## 2010-06-21 LAB — CBC
HCT: 45.7 % (ref 39.0–52.0)
Hemoglobin: 15.9 g/dL (ref 13.0–17.0)
MCHC: 34.9 g/dL (ref 30.0–36.0)
MCV: 98 fL (ref 78.0–100.0)
Platelets: 105 10*3/uL — ABNORMAL LOW (ref 150–400)
RBC: 4.66 MIL/uL (ref 4.22–5.81)
RDW: 16.5 % — ABNORMAL HIGH (ref 11.5–15.5)
WBC: 5.8 10*3/uL (ref 4.0–10.5)

## 2010-06-21 LAB — DIFFERENTIAL
Basophils Absolute: 0 10*3/uL (ref 0.0–0.1)
Basophils Relative: 0 % (ref 0–1)
Eosinophils Absolute: 0.2 10*3/uL (ref 0.0–0.7)
Eosinophils Relative: 4 % (ref 0–5)
Lymphocytes Relative: 17 % (ref 12–46)
Lymphs Abs: 1 10*3/uL (ref 0.7–4.0)
Monocytes Absolute: 0.3 10*3/uL (ref 0.1–1.0)
Monocytes Relative: 5 % (ref 3–12)
Neutro Abs: 4.3 10*3/uL (ref 1.7–7.7)
Neutrophils Relative %: 73 % (ref 43–77)

## 2010-06-21 LAB — URINE MICROSCOPIC-ADD ON

## 2010-06-21 NOTE — Telephone Encounter (Signed)
rx refill sent to pharmacy for Lipitor

## 2010-06-22 ENCOUNTER — Ambulatory Visit: Payer: Medicare Other | Admitting: Physical Therapy

## 2010-06-26 ENCOUNTER — Ambulatory Visit: Payer: Medicare Other | Admitting: Physical Therapy

## 2010-06-26 ENCOUNTER — Encounter: Payer: Self-pay | Admitting: Pulmonary Disease

## 2010-06-27 ENCOUNTER — Encounter: Payer: Self-pay | Admitting: Pulmonary Disease

## 2010-06-27 ENCOUNTER — Ambulatory Visit: Payer: Medicare Other | Admitting: Physical Therapy

## 2010-06-27 ENCOUNTER — Ambulatory Visit (INDEPENDENT_AMBULATORY_CARE_PROVIDER_SITE_OTHER): Payer: Medicare Other | Admitting: Pulmonary Disease

## 2010-06-27 VITALS — BP 132/72 | HR 66 | Temp 97.6°F | Ht 75.0 in | Wt 249.0 lb

## 2010-06-27 DIAGNOSIS — G4733 Obstructive sleep apnea (adult) (pediatric): Secondary | ICD-10-CM

## 2010-06-27 NOTE — Progress Notes (Signed)
  Subjective:    Patient ID: Luis Yu, male    DOB: 1946/02/21, 65 y.o.   MRN: [id number]  HPI 64/M, diabetic, CAD s/p CABg, retired [employer] for FU of obstructive sleep apnea &dyspnea. [name] reports loud snoring especially on his back & witnessed apneas & non refreshing sleep. Epworth Sleepiness Score is 21/24 as reported by wife.  PSG 6/27 /11  - wt 246 lbs -showed severe obstructive sleep apnea with AHI 50/h & desaturation to 87%, corrected by CPAP 10 cm - he did not tolerate a full face mask well due to shingles . This has improved but still has neuropathic pain. He reports dyspne aon exertion, walking up hills or stairs. Recent stents placed - evaluation by dr Janeann Forehand, echo 3/11 showed RVSP 25, nml LV funtion. He has been on 20 mg lisnorpil , will NOT take beta blocker.   CXR 08/12/09 nml  December 13, 2009 12:01 PM  download 8/23-9/26 >> good compliance, avg pr 10 cm - residual AHI 15/h, central 4/h, leak ++  April 11, 2010 3:33 PM  shingles pain gone now ,cpap download 10/12- 01/28/10 >> excellent compliance , some residual events  (AHI 21/h, centrals 5/h ) on 10 cm, will increase to 12 cm & rechk, leak ++ on download, c/o dry mouth Wife still concerned about apneas Interim- required another stent download on 12 cm shows leak ++, residual AHI 21/h,centrals 5/h,  good usage   06/27/2010 Phone call >>received new full face mask March 2nd. c/o not sleeping well since, dry mouth is worse, straps are not adjusting correctly, hard to drink water at night for the dry mouth. States has a humidifier on same set  Options include -make appt with sleep lab for full face mask fit at NO additional cost  -go back to nasal mask Finally went back to nasal pillows with chin strap Downalod on 12 cm 3/6-06/20/10 shows increased centrals 11/h, with AHI 28/h, hypopneas 11/h   Review of Systems Pt denies any significant  nasal congestion or excess secretions, fever, chills,  sweats, unintended wt loss, pleuritic or exertional cp, orthopnea pnd or leg swelling.  Pt also denies any obvious fluctuation in symptoms with weather or environmental change or other alleviating or aggravating factors.    Pt denies any increase in rescue therapy over baseline, denies waking up needing it or having early am exacerbations or coughing/wheezing/ or dyspnea       Objective:   Physical Exam Gen. Pleasant, well-nourished, in no distress ENT - no lesions, no post nasal drip Neck: No JVD, no thyromegaly, no carotid bruits Lungs: no use of accessory muscles, no dullness to percussion, clear without rales or rhonchi  Cardiovascular: Rhythm regular, heart sounds  normal, no murmurs or gallops, no peripheral edema Musculoskeletal: No deformities, no cyanosis or clubbing         Assessment & Plan:

## 2010-06-27 NOTE — Patient Instructions (Signed)
I will lower pressure back down to 10 cm OK to take upto 5mg  melatonin at 7pm

## 2010-06-27 NOTE — Assessment & Plan Note (Signed)
Go back donw to 10 cm , ct nasal pillows with chin strap Will accept some residual symptoms as long as not overly symptomatic Weight loss encouraged, compliance with goal of at least 4-6 hrs every night is the expectation. Advised against medications with sedative side effects Cautioned against driving when sleepy - understanding that sleepiness will vary on a day to day basis  OK to use 5 mg melatonin 2-3 hrs prior to bedtime Other causes of somnolence include neurontin

## 2010-06-28 ENCOUNTER — Ambulatory Visit: Payer: Medicare Other | Admitting: Physical Therapy

## 2010-06-28 ENCOUNTER — Encounter: Payer: Medicare Other | Admitting: Physical Therapy

## 2010-06-29 ENCOUNTER — Ambulatory Visit: Payer: Medicare Other | Admitting: Physical Therapy

## 2010-07-04 ENCOUNTER — Encounter: Payer: Self-pay | Admitting: Pulmonary Disease

## 2010-07-04 NOTE — Telephone Encounter (Signed)
I advise pt only use short term - 3-5 days as needed

## 2010-07-05 NOTE — Telephone Encounter (Signed)
Call placed to patient at [phone], no answer, a detailed voice message was left informing patient per Dr Artist Pais instructions.

## 2010-08-04 NOTE — Op Note (Signed)
NAMEHERB, BELTRE NO.:  [id number]   MEDICAL RECORD NO.:  [id number]          PATIENT TYPE:  INP   LOCATION:  1512                         FACILITY:  [facility]   PHYSICIAN:  Ollen Gross, M.D.    DATE OF BIRTH:  Jan 08, 1946   DATE OF PROCEDURE:  05/02/2005  DATE OF DISCHARGE:                                 OPERATIVE REPORT   PREOPERATIVE DIAGNOSIS:  Osteoarthritis right knee.   POSTOPERATIVE DIAGNOSIS:  Osteoarthritis right knee.   PROCEDURE:  Right total knee arthroplasty computer navigated.   SURGEON:  Ollen Gross, M.D.   ASSISTANT:  Avel Peace, PA-C.   ANESTHESIA:  General with postop Marcaine pain pump.   ESTIMATED BLOOD LOSS:  Minimal.   DRAINS:  Hemovac x1.   TOURNIQUET TIME:  74 minutes at 300 mmHg.   COMPLICATIONS:  None.   CONDITION:  Stable to recovery.   BRIEF CLINICAL NOTE:  [name] is a 65 year old male who had an on-the-job injury  over a year ago to his right knee. He underwent arthroscopy showing  significant degenerative change. He has gone on to develop recurrent  effusions and persistent pain. He presents now for total knee arthroplasty.   PROCEDURE IN DETAIL:  After successful administration of general anesthetic,  a tourniquet was placed high on the right thigh and right lower extremity is  prepped and draped in the usual sterile fashion. Extremity is wrapped in  Esmarch, knee flexed and tourniquet inflated to 300 mmHg. The midline  incision is made with a 10 blade through the subcutaneous tissue to the  level of the extensor mechanism. A fresh blade is used to make a medial  parapatellar arthrotomy and then the soft tissue over the proximal medial  tibia is subperiosteally elevated to the joint line with a knife and into  the semimembranosus bursa with a Cobb elevator. The soft tissue over the  proximal lateral tibia is also elevated with attention being paid to  avoiding the patellar tendon on the tibial tubercle. The  patella was then  everted, knee flexed 90 degrees and ACL and PCL removed. The two Schanz  screws were placed into the tibia and 2 into the femur for placement of the  computerized arrays. The anatomic data is then input into the computer and  the femoral and tibial models were generated. The alignment is about 6  degrees of valgus and about an 8 degree flexion contracture.   We placed a distal femoral cutting block and placed it to make a 10 mm  resection in neutral varus-valgus and neutral flexion/extension. The block  is pinned and resection made with an oscillating saw. The verification  device was placed and a cut was made as planned. A size 5 is the size  generated by the computer which confirms intraop measurement. We did our  rotation off the epicondylar axis and the size 5 cutting block is placed and  the anterior, posterior and chamfer cuts were made.   The tibia is subluxed forward and the menisci removed. Under computer  guidance, we placed a tibial cutting block  to remove 10 mm off the tibial  surface in neutral varus-valgus and about 2 degrees posterior slope. The  block is pinned and resection is made with an oscillating saw. Size 5 is the  most appropriate tibial component and the proximal tibia is prepared with  the modular drill and keel punch for a size 5 . Femoral preparation is  completed with the intercondylar cut for the 5.   The size 5 mobile bearing tibial trial with the size 5 posterior stabilized  femoral trial and a 10 mm posterior stabilized rotating platform insert  trial are placed. With the 10, we were within about 5 degrees of full  extension. There was also a little bit of tightness in flexion and so we  ended up taking an additional 2 mm off the tibial surface and with the  verification device confirmed that it was in neutral varus-valgus and about  2 degrees posterior slope. We placed a tibial trial again and this time with  a 10 full extension was  achieved with excellent varus and valgus balance  throughout full range of motion. The alignment is 0.5 degrees of varus. We  achieved full extension. The patella was then everted and thickness measured  to be 25 mm. Freehand resection is taken to 14 mm, 41 template is placed,  lug holes are drilled, trial patella is placed and it tracks normally. The  osteophytes are then removed off the posterior femur with the trial in  place. All trials are removed and the cut bone surfaces prepared with  pulsatile lavage. The cement is mixed and once ready for implantation the  size 5 mobile bearing tibial tray, size 5 posterior stabilized femur and 41  patella are cemented into place and patella is held with a clamp. A trial 10  mm insert is placed, knee held in full extension and all extruded cement  removed. We did note that on the medial side the femoral component lifted up  a little bit so we ended up taking the component off and recementing. We  ended up having a perfect fit and there was no lift-off at all. All extruded  cement is removed, the knee is reduced. The wound is copiously irrigated  with saline solution and the extensor mechanism closed over a Hemovac drain  with interrupted #1 PDS. Flexion against gravity was 135 degrees.  Tourniquets is released with a total time of 74 minutes. Minor bleeding is  stopped with cautery. Subcu was closed with interrupted 2-0 Vicryl and  subcuticular running 4-0 Monocryl. The catheter for the Marcaine pain pump  is placed and the pump is initiated. Steri-Strips and a bulky sterile  dressing applied. The hemovac was hooked to suction, he was placed into a  knee immobilizer, awakened and transported to recovery stable condition.      Ollen Gross, M.D.  Electronically Signed     FA/MEDQ  D:  05/02/2005  T:  05/03/2005  Job:  [id number]

## 2010-08-04 NOTE — Discharge Summary (Signed)
Luis Yu, Luis Yu                  ACCOUNT NO.:  [id number]   MEDICAL RECORD NO.:  [id number]          PATIENT TYPE:  INP   LOCATION:  1512                         [facility]ACILITY[facility]   PHYSICIAN:  Ollen Gross, M.D.    DATE OF BIRTH:  09-08-1945   DATE OF ADMISSION:  05/02/2005  DATE OF DISCHARGE:  05/09/2005                                 DISCHARGE SUMMARY   ADMISSION DIAGNOSES:  1.  Osteoarthritis right knee.  2.  Coronary arterial disease.  3.  Hypertension.  4.  Gastroesophageal reflux disease.  5.  Non-insulin-dependent diabetes mellitus.  6.  Hyperlipidemia.   DISCHARGE DIAGNOSES:  1.  Osteoarthritis right knee status post right total knee arthroplasty      computer navigation assisted.  2.  Postoperative blood loss anemia that did not require transfusion.  3.  Postoperative hyponatremia improving.  4.  Postoperative chest pain/chest tightness noncardiac in nature.  5.  Postoperative ileus resolved.  6.  Coronary arterial disease.  7.  Hypertension.  8.  Gastroesophageal reflux disease.  9.  Non-insulin-dependent diabetes mellitus.  10. Hyperlipidemia.   PROCEDURE:  May 02, 2005 right total knee, computer navigation  assisted. Surgeon Dr. Lequita Halt, assistant Avel Peace, PA-C, anesthesia  general with postop Marcaine pain pump. Tourniquet time 74 minutes.   CONSULTATIONS:  1.  Cardiology, Dr. Dietrich Pates.  2.  Internal medicine, Dr. Amador Cunas.   BRIEF HISTORY:  Luis Yu is a 65 year old male with an on the job injury over a  year ago to his right knee. He underwent arthroscopy with significant  degenerative changes. He has gone on to develop recurrent effusion and pain  and now presents for a total knee.   LABORATORY DATA:  Preop CBC, hemoglobin 15.2, hematocrit of 44.3, normal  white count. Postop hemoglobin 11.1, drifted down to 10.3, last noted H&H  9.6 and 27.9. PT and PTT preop 12.5 and 26 respectively. Serial pro times  followed. Last noted PT/INR 18.9 and  1.6. Chem panel on admission elevated  glucose of 159, remaining chem panel all within normal limits. Glucose went  up to 235 back down to 203, potassium remained normal, sodium however  dropped from 139 to 133 back up to 134. Cardiac enzymes taken x2, first set  CK normal 95, CK-MB normal 1.5, troponin 0.01. Second set CK normal 106, CK-  MB normal at 1.4, relative index normal at 2.3, troponin 0.01. Preop UA  glucose otherwise negative. Followup UA positive for glucose otherwise  negative. Blood group type O+.   EKG dated May 03, 2005 normal sinus rhythm, nonspecific T wave  abnormalities. When compared no significant change since last tracing,  confirmed by Arvilla Meres, M.D.  Abdominal acute with chest May 04, 2005.   IMPRESSION:  Impression of the abdominal series abnormal large and small  bowel distention consistent with ileus versus distal large bowel  obstruction. Chest, lungs are suboptimally inflated but clear. Followup  abdominal series on May 05, 2005 increased gaseous colonic distention  consistent with ileus, small bowel air is stable, no free air. Abdomen on  May 06, 2005 no change since colonic ileus.   HOSPITAL COURSE:  Admitted to [facility], tolerated procedure  well, later went to the recovery room and then to the orthopedic floor.  Started on PCA and p.o. analgesics for pain control following surgery. Did  not get much rest on the night of surgery due to pain. Was doing fairly well  in the morning of day 1 however actually had some asymptomatic hypotension  although did have good urinary output. His blood pressure medications were  held. Unfortunately developed some chest pain later that day requiring  cardiology consult. The patient was seen in consultation by Dr. Dietrich Pates.  After evaluation it was felt the chest pain, chest tightness was not cardiac  in etiology. It was felt it would be more from his reflux, his Nexium was   increased, given p.r.n. Maalox. By day 2 he was doing better, denied any  chest pain or shortness of breath. Dressing was changed, incision looked  good but noted to have some blisters from the tape. Used Tegaderm for a  covering. His cardiac enzymes which were ordered were all negative for any  type of ischemic changes. Unfortunately however he developed some increased  abdominal distention and nausea by day 2 requiring internal medicine  consult. The patient was seen by Dr. Amador Cunas. Abdominal series showed  colonic ileus versus obstruction. Eventually he did require NG tube  placement and made n.p.o. Placed on intermittent nasogastric suction. This  did help decompress and relieve some of his abdominal distention and pain by  day 4. His therapy had been held because of the above issues but he was  feeling much better by day 4 with decreased pain and only some mild  soreness. He was starting to pass flatus and also starting to move his  bowels. After he improved clinically the NG tube ws removed, therapy ws  reinitiated. By day 5, he was feeling much better, abdomen was softer, NG  tube had been out, started getting with physical therapy. He continued with  daily dressing changes, increased his diet. He continued to slowly progress  with physical therapy and received daily therapy. By day 6, he was  ambulating approximately 60 feet and then later 160 feet. He continued to  improve and by day May 09, 2005, he was sitting up in bed, voiding  well, moving his bowels, no complaints of chest pain or tightness. His  abdominal discomfort had resolved, ileus had improved. He was doing well and  was discharged home.   PLAN:  1.  The patient was discharged home on May 09, 2005.  2.  Discharge diagnoses please see above.  3.  Discharged medications - Percocet, Robaxin, Coumadin.  4.  Diet - diabetic diet, cardiac diet.  5.  Followup 2 weeks from surgery. 6.  Activity - home health  PT and home health nursing, weightbearing as      tolerated, total knee protocol.   DISPOSITION:  Home.   CONDITION ON DISCHARGE:  Improved.      Alexzandrew L. Julien Girt, P.A.      Ollen Gross, M.D.  Electronically Signed    ALP/MEDQ  D:  06/07/2005  T:  06/08/2005  Job:  [id number]   cc:   Thomos Lemons, D.O. [facility]  [address] [place], [place] [address]   Gordy Savers, M.D. [facility]  [address] [place]  [place] [address]   Pricilla Riffle, M.D.  [address]  [address]  [place]  [place] [address]

## 2010-08-04 NOTE — Cardiovascular Report (Signed)
Luis Yu, Luis Yu NO.:  [id number]   MEDICAL RECORD NO.:  [id number]          PATIENT TYPE:  OIB   LOCATION:  2899                         FACILITY:  [facility]   PHYSICIAN:  Arturo Morton. Riley Kill, M.D. [facility] OF BIRTH:  11-24-1945   DATE OF PROCEDURE:  03/16/2005  DATE OF DISCHARGE:                              CARDIAC CATHETERIZATION   INDICATIONS:  Luis Yu is a 65 year old gentleman with previous bypass  surgery. He has had some exertional chest tightness since March of this past  year. He needs to have a total knee. He has had previous revascularizations  surgery with internal mammary to the LAD, saphenous vein graft to the distal  right coronary and saphenous vein graft to the OM. He was last studied in  2003 by Dr. Samule Ohm; at times grafts were patent but he had a fair amount of  native disease especially involving the circumflex. Current study was done  to reassess coronary anatomy.   PROCEDURE:  1.  Left heart catheterization.  2.  Selective coronary arteriography.  3.  Selective left ventriculography.  4.  Saphenous vein graft angiography.  5.  Selective left internal mammary angiography.   DESCRIPTION OF PROCEDURE:  The patient was taken to the catheterization  laboratory, prepped and draped in the usual fashion. Through an anterior  puncture, the right femoral artery was easily entered. A 6-French sheath was  placed. Views of the left and right coronary arteries were obtained in  multiple angiographic projections. Central aortic and left ventricular  pressures were measured with pigtail. Ventriculography was performed in the  RAO projection. Saphenous vein grafts were injected with the RCA catheter as  was the internal mammary. The patient tolerated the procedure well and was  taken to holding area in satisfactory clinical condition. Prior to the  procedure, he did have significant hyperglycemia.   HEMODYNAMIC DATA:  1.  Central aortic pressure  160/87.  2.  Left ventricular pressure 161/14.  3.  No gradient on pullback across the aortic valve.   ANGIOGRAPHIC DATA:  1.  Ventriculography was done in the RAO projection. Overall systolic      function was preserved. Ejection fraction was felt to be in excess of      50%.  2.  Left main demonstrates about 50% narrowing in the distal most aspect. It      does appear relatively smooth.  3.  The LAD is totally occluded after diagonal and is severely diseased      proximally.  4.  The left internal mammary to the distal LAD appears to be widely patent.      It fills the LAD retrograde as well as antegrade. There is fair amount      of diffuse luminal irregularity in the LAD proper. The retrograde      filling provides a diagonal.  5.  The circumflex proper demonstrates a first marginal branch which is      totally occluded. This vessel was grafted. There is a second marginal      prior to an 80-90% focal stenosis in the  vessel. Following this, there      is a second, third marginal and a fourth marginal that is totally      occluded distally.  6.  The right coronary artery is severely diseased and totally occluded.  7.  The saphenous vein graft to the right coronary artery demonstrates about      40% narrowing proximally. This perhaps has slightly progressed from the      previous study, although it is difficult to be sure. It inserts distally      into the distal right coronary with some segmental plaquing of 40-50%      before it leads into a posterior descending or two posterolateral      branches.  8.  The saphenous vein graft to the OM also appears to demonstrate mild      progression of disease proximally. It inserts into a moderately diseased      circumflex marginal.   CONCLUSION:  1.  Preserved left ventricular function.  2.  Continued patency internal mammary to the left anterior descending      artery.  3.  Saphenous vein grafts to the obtuse marginal and posterior  descending      artery with some mild progression of disease in the saphenous vein graft      but not critical narrowing.   DISPOSITION:  The patient has had poor medical compliance. He says that he  does not have insurance plan and has not been able to afford his medicines.  As result, his glucoses has been out of control. I have explained to him  that long-term outcome may be related to how well he does with regard to  medical compliance. There are some mild changes in the vein grafts, although  there not critical and a continued program with medical therapy would be  warranted. We will have him follow up with Dr. Corinda Gubler in the office.      Arturo Morton. Riley Kill, M.D. [facility]  Electronically Signed     TDS/MEDQ  D:  03/16/2005  T:  03/16/2005  Job:  [id number]   cc:   Corinda Gubler, M.D.   CV Laboratory   Patient's medical record
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
## 2010-08-04 NOTE — Consult Note (Signed)
NAMEAMRON, GUERRETTE NO.:  [id number]   MEDICAL RECORD NO.:  [id number]          PATIENT TYPE:  INP   LOCATION:  1512                         [facility]ACILITY[facility]   PHYSICIAN:  Pricilla Riffle, M.D.    DATE OF BIRTH:  1945-08-31   DATE OF CONSULTATION:  05/03/2005  DATE OF DISCHARGE:                                   CONSULTATION   IDENTIFICATION:  Mr. Wolford is a 65 year old gentleman, who we are asked to see  regarding chest pain.   HISTORY OF PRESENT ILLNESS:  The patient is followed by Dr. Glennon Hamilton here  in cardiology clinic.  He is status post CABG in 1996 with LIMA to LAD; SVG  to OM, SVG to RCA/PDA.  Last cardiac catheterization in December 2006 shows  patent grafts.  Still had some residual disease.  There was 100% OM with  patent graft to first OM with mild __________  and proximal disease.  Second  OM had an 80-90% focal stenosis.  Third OM and fourth OM were occluded.  RCA  was occluded and the vein graft to the RCA had a 40% narrowing proximally.  Plan was for medical therapy.   The patient says if he over exerts himself and he mainly gets shortness of  breath.  He really does not get a lot of chest pain.   The patient, yesterday, underwent a right total knee replacement.  Last  night he did not get much sleep, had epigastric pain that started after  dinner.  He said it was treated with Maalox and ginger ale.  The pain was  helped with belching.  It was 8/10 initially, then 4-5/10 and now gone.  The  patient said again it is very different to his previous symptoms.   ALLERGIES:  NONE.   MEDICATIONS PRIOR TO ADMISSION:  1.  Actos 45.  2.  Lipitor 80.  3.  Aspirin 81 mg.  4.  Zetia 10 mg.  5.  Glyburide 10 mg.  6.  Metformin 1 g b.i.d.  7.  Lisinopril/hydrochlorothiazide 20/12.5 daily.  8.  Imdur 30 mg daily.  9.  Prandin 2 mg, 2 t.i.d.  10. Skelaxin at 25 t.i.d.  11. Nexium 40 daily.  12. Percocet.  13. Dilaudid p.r.n.   PAST MEDICAL  HISTORY:  1.  Coronary artery disease.  2.  Hypertension.  3.  Diabetes.  4.  Dyslipidemia.  5.  GE reflux.   PAST SURGICAL HISTORY:  Status post knee replace, status post arthroscopy  and status post a left knee replacement, and status post CABG.   SOCIAL HISTORY:  The patient lives in [place], does not smoke and does not  drink.   FAMILY HISTORY:  Significant again for CAD in parents.   REVIEW OF SYSTEMS:  All systems reviewed negative to the above problem  except as noted.   PHYSICAL EXAMINATION:  VITAL SIGNS:  Blood pressure earlier today 90/52,  with IV fluids 125/72, pulse is 105-88 on my check, temperature is 98.6.  O2  sat on room air:  94% on room air.  GENERAL:  The patient is in no acute distress.  Denies chest pain.  HEENT:  Normocephalic, atraumatic.  EOMI.  PERRL.  NECK:  No bruits, JVP is normal.  LUNGS:  Clear to auscultation anteriorly.  CARDIAC EXAM:  Regular rate and rhythm, S1 and S2, grade 1/6 systolic murmur  heard best at the base.  ABDOMEN:  Benign.  EXTREMITIES:  2+ distal pulses and no edema.  Right knee with status post  replacement, dressed.   LABORATORY:  Hemoglobin 11.1, WBC of 9.1, platelets of 132, BUN and  creatinine of 19 and 1.2, potassium of 4.4.  EKG normal sinus rhythm at a  rate of 92 beats per minute.  Nonspecific ST-T wave changes.  Small QN3.   IMPRESSION:  The patient is a 65 year old with known coronary artery disease  and has had chest pain last night.  It does not appear to be cardiac.  It is  different from what he has had, would plan to increase Nexium for reflux and  add p.r.n. Maalox.  Continue telemetry and add 2 sets cardiac enzymes.  Check EKG in the a.m.  Again, the patient has known disease and may have  intermittent ischemia, but his symptoms are different.   Would cut back on his lisinopril/hydrochlorothiazide to allow him to  tolerate his pain medicine from a blood pressure perspective.  Would keep  Imdur at current  dose.  I will continue to follow.           ______________________________  Pricilla Riffle, M.D.     PVR/MEDQ  D:  05/03/2005  T:  05/03/2005  Job:  [id number]

## 2010-08-04 NOTE — Cardiovascular Report (Signed)
NAME:  KREED, KAUFFMAN                            ACCOUNT NO.:  [id number]   MEDICAL RECORD NO.:  [id number]                   PATIENT TYPE:  OIB   LOCATION:  2899                                 FACILITY:  [facility]   PHYSICIAN:  Salvadore Farber, M.D. [facility]         DATE OF BIRTH:  22-Mar-1945   DATE OF PROCEDURE:  01/29/2002  DATE OF DISCHARGE:                              CARDIAC CATHETERIZATION   PROCEDURE:  Left heart catheterization, left ventriculography, coronary  angiography, saphenous vein graft angiography x2, left subclavian  angiography, left internal mammary artery angiography, distal aortogram.   CARDIOLOGIST:  Salvadore Farber, M.D. [facility]   INDICATIONS:  The patient is a 65 year old gentleman who underwent coronary  artery bypass grafting five years ago. He has done well until approximately  six weeks ago when he developed the onset of recurrent angina. He is,  therefore, referred for diagnostic angiography.   DIAGNOSTIC TECHNIQUE:  Informed consent was obtained. Under 1% lidocaine  local anesthesia, a 6-French sheath was placed in the right femoral artery  using the modified Seldinger technique. Diagnostic angiography was performed  using JL4 catheter for the native left, JR4 catheter for the native right,  JR4 catheter for the saphenous vein graft to the obtuse marginal, LIMA  catheter for the subclavian and LIMA, and AR2 catheter for the vein graft to  the left PDA.  A pigtail catheter was used for ventriculography and with  heart catheterization.   COMPLICATIONS:  None.   FINDINGS:  1. Left ventricle:  152/13/25.  2. Ejection fraction equals 50-55% without regional wall motion abnormality.  3. No aortic stenosis or mitral regurgitation.  4. Left main:  Angiographically normal.  5. Left anterior descending:  The LAD has a 90% stenosis after the take off     of the first diagonal branch and is occluded in its mid section. The LIMA     to LAD is widely patent. The  distal LAD is a relatively small vessel. The     LIMA graft fills the mid LAD in retrograde fashion to supply a moderate     size second diagonal branch. His diagonal has an 80% ostial stenosis.  6. Circumflex:  The circumflex is a large dominant vessel, giving rise to     four obtuse marginal branches and the PDA. The first obtuse marginal     branch is small and has an 80% stenosis. The fourth obtuse marginal     branch is occluded at its ostium. It is supplied via a widely patent     saphenous vein graft. There is an 80% stenosis of the mid portion of the     circumflex overlying the take off of the second obtuse marginal branch.     The saphenous vein graft to the left PDA is widely patent. It fills the     AV groove circumflex in a retrograde fashion.  7. Right coronary  artery:  Small nondominant vessel with 80% ostial     stenosis.  8. Patent left subclavian without stenosis.  9. Aortography demonstrates normal abdominal aorta with normal bilateral     single renal arteries. No evidence of abdominal aortic aneurysm, though     the sensitivity is low.   IMPRESSION AND PLAN:  The patient has widely patent grafts. Ischemia is  likely due to the second obtuse marginal which is filled in the retrograde  fashion from the territory of the left anterior descending supplied by the  left internal mammary artery. It is possible that obtuse marginal-3 is also  a territory of ischemia. It has supplied the native circumflex to an 80%  stenosis; however, it also appears supplied via retrograde filling from the  saphenous  vein graft to the left posterior descending artery.  Therefore, I think this  is a less likely cause of ischemia. Would favor a plan of augmented medical  therapy. Should this not provide satisfactory relief, exercise testing with  nuclear imaging could be performed to guide further invasive therapy.                                               Salvadore Farber, M.D.  [facility]    WED/MEDQ  D:  01/29/2002  T:  01/30/2002  Job:  [id number]   cc:   Cecil Cranker, M.D. [facility]
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
## 2010-08-04 NOTE — Op Note (Signed)
NAMEHAMMAD, Luis Yu NO.:  [id number]   MEDICAL RECORD NO.:  [id number]          PATIENT TYPE:  AMB   LOCATION:  DAY                          FACILITY:  [facility]   PHYSICIAN:  Ollen Gross, M.D.    DATE OF BIRTH:  09/19/1945   DATE OF PROCEDURE:  06/14/2004  DATE OF DISCHARGE:                                 OPERATIVE REPORT   PREOPERATIVE DIAGNOSIS:  Right knee medial meniscal tear.   POSTOPERATIVE DIAGNOSIS:  Right knee medial meniscal tear plus lateral  meniscal tear and chondral defect, medial and lateral femoral condyles.   PROCEDURE:  Right knee arthroscopy with meniscal debridement, medial and  lateral, and chondroplasty.   SURGEON:  Ollen Gross, M.D.   ANESTHESIA:  Local with MAC.   ESTIMATED BLOOD LOSS:  Minimal.   DRAINS:  None.   COMPLICATIONS:  None.   CONDITION:  Stable to the recovery room.   CLINICAL NOTE:  Luis Yu is a 65 year old male who had an on-the-job injury  approximately two months ago to his right knee.  He sustained at least a  medial meniscal tear.  He has had significant pain throughout the knee and  mechanical symptoms.  He presents now for arthroscopy and debridement.   PROCEDURE IN DETAIL:  After successful administration of local with MAC  anesthetic, a tourniquet is placed high on the right thigh and right lower  extremity prepped and draped in the usual sterile fashion.  A standard  superomedial and inferolateral incisions are made.  The inflow cannula  passed superomedial and the camera passed inferolateral.  Arthroscopic  visualization proceeds.  He has a fair amount of loose bodies floating  around throughout the joint.  There is slight synovitis.  The surface of the  patella looks normal with some grade II change.  The trochlea looks normal.  Medial and lateral gutters are visualized, and there is synovitis and no  evidence of loose bodies.  Flexion and valgus force is applied to the knee,  and the medial  compartment is entered.  Upon entering the medial  compartment, I noted that there is a small defect on the medial femoral  condyle as well as a degenerative tear in the body and posterior horn of the  medial meniscus.  The spinal needle was utilized to localize the inferior  medial portal.  A small incision made.  The dilator placed, and a probe  placed.  The meniscus is unstable.  It is debrided back to a stable base  with baskets and a 4.2 mm shaver.  There was about a 1 x 1 cm chondral  defect, and we debrided that back to a stable bony base with stable  cartilaginous edges.  The end of the chondral notch is visualized.  The ACL  was slightly attenuated but is intact.  The lateral compartment is entered,  and there is a pretty significant tear throughout the body and posterior  horn of the lateral meniscus, which is vertical and horizontal in nature.  It is debrided back to a stable base with baskets and a 4.2  mm shaver.  There is also, again, a 1.1 cm chondral defect, not all the way down to  bone.  It is debrided back to a stable cartilaginous base with a 4.2 shaver.  The patellofemoral joint is again visualized, and there is no evidence of  any unstable cartilage that needs to be addressed.  The arthroscopic  equipment is then removed from the inferior portals, which were closed with  interrupted 4-0 nylon.  Then 20 ml of 0.25% Marcaine with epi are injected  through the inflow cannula.  After that, the cannula was removed, and that  portal closed with nylon.  A bulky sterile dressing is applied, and he is  awakened and transported to recovery in stable condition.      FA/MEDQ  D:  06/14/2004  T:  06/14/2004  Job:  [id number]

## 2010-08-04 NOTE — H&P (Signed)
Luis Yu, Luis Yu NO.:  [id number]   MEDICAL RECORD NO.:  [id number]            PATIENT TYPE:   LOCATION:                                 FACILITY:   PHYSICIAN:  Ollen Gross, M.D.         DATE OF BIRTH:   DATE OF ADMISSION:  05/02/2005  DATE OF DISCHARGE:                                HISTORY & PHYSICAL   DATE OF OFFICE VISIT HISTORY AND PHYSICAL:  April 17, 2005.   CHIEF COMPLAINT:  Right knee pain.   HISTORY OF PRESENT ILLNESS:  A 65 year old male seen by Dr. Lequita Halt for  ongoing right knee pain.  He has undergone previous scope with meniscal  debridement, chondroplasty, and multiple defects.  He continues to have pain  and effusions with the knee and significant arthritic changes.  He has done  work conditioning in the past, and working extremely hard; however, he  continues to have pain interfering with his daily activities and work.  It  is felt due to the lack of improvement and the advancement of his arthritis  that he would benefit from undergoing knee replacement.  Risks and benefits  have been discussed, and the patient has elected to proceed with surgery.   ALLERGIES:  No known drug allergies.   CURRENT MEDICATIONS:  1.  Metformin 1000 mg twice a day.  2.  Glyburide 5 mg daily.  3.  Lisinopril/hydrochlorothiazide 20/12.5 daily.  4.  Isosorbide 30 mg daily.  5.  Actos 45 mg daily.  6.  Prandin 2 mg two tablets three times a day.  7.  Lipitor 80 mg daily.  8.  Glyset 25 mg three times a day.  9.  Zetia 10 mg daily.  10. Aspirin 81 mg daily.  11. Nexium 40 mg daily.  12. Garlique.  13. Multivitamin.   PAST MEDICAL HISTORY:  1.  Hypertension.  2.  Gastroesophageal reflux disease.  3.  Noninsulin-dependent diabetes mellitus.  4.  Hyperlipidemia.  5.  Coronary arterial disease.   PAST SURGICAL HISTORY:  1.  Right knee arthroscopy in March of 2006.  2.  Left total knee arthroplasty in April of 2003.  3.  Coronary artery bypass  grafting, 3 vessels, in 1996.  4.  Left and right knee arthroscopies done around 2000.   SOCIAL HISTORY:  Single.  [employer].  Nonsmoker.  No  alcohol.  Two children.   FAMILY HISTORY:  Father with a history of heart disease and hypertension.  Mother deceased at age 85 with heart disease.  Grandparents with diabetes.   REVIEW OF SYSTEMS:  GENERAL:  No fevers, chills or night sweats.  NEUROLOGIC:  No seizures, syncope or paralysis.  RESPIRATORY:  He does get a  little shortness of breath on exertion.  No shortness of breath at rest.  No  productive cough or hemoptysis.  CARDIOVASCULAR:  No chest pain or angina or  palpitations.  GI:  No nausea, vomiting, diarrhea or constipation.  GU:  A  little bit of nocturia.  No dysuria, hematuria or discharge.  MUSCULOSKELETAL:  Right knee.   PHYSICAL EXAMINATION:  VITAL SIGNS:  Pulse 88, respirations 12, blood  pressure 118/62.  GENERAL:  A 65 year old white male, well-nourished, well-developed, in no  acute distress.  He is alert, oriented and cooperative.  Very pleasant.  A  good historian.  HEENT:  Normocephalic and atraumatic.  Pupils equal, round and reactive.  Oropharynx clear.  Extraocular movements intact.  NECK:  Supple.  CHEST:  Clear, anterior and posterior chest walls.  No rhonchi, rales or  wheezing.  HEART:  Regular rate and rhythm.  No murmur.  ABDOMEN:  Soft, nontender.  Bowel sounds present.  Slightly round.  RECTAL/BREAST/GENITALIA:  Not done.  Not pertinent to present illness.  EXTREMITIES:  Right knee shows mild to moderate effusion.  Range of motion  of 5 to 115 degrees.  Tender more medial than lateral.   IMPRESSION:  1.  Osteoarthritis, right knee.  2.  Coronary arterial disease.  3.  Hypertension.  4.  Gastroesophageal reflux disease.  5.  Noninsulin-dependent diabetes mellitus.  6.  Hyperlipidemia.   PLAN:  The patient admitted to [facility] to undergo right total  knee arthroplasty.   Surgery will be performed by Dr. Ollen Gross.  His  medical doctor is Dr. Thomos Lemons with [facility]    hospitalist will be consulted to assist with medical management of the  patient postoperatively.      Alexzandrew L. Julien Girt, P.A.      Ollen Gross, M.D.  Electronically Signed    ALP/MEDQ  D:  05/01/2005  T:  05/01/2005  Job:  [id number]   cc:   Thomos Lemons, D.O. [facility]  [address] [place], [place] [address]

## 2010-08-04 NOTE — Consult Note (Signed)
NAMEEUGINE, BUBB NO.:  [id number]   MEDICAL RECORD NO.:  [id number]          PATIENT TYPE:  INP   LOCATION:  1512                         FACILITY:  [facility]   PHYSICIAN:  Gordy Savers, M.D. LHCDATE OF BIRTH:  03/05/1946   DATE OF CONSULTATION:  05/04/2002  DATE OF DISCHARGE:                                   CONSULTATION   HISTORY OF PRESENT ILLNESS:  The patient is a 65 year old gentleman, who is  2 days status post right total knee replacement surgery.  Postoperatively he  has developed increasing abdominal distention, nausea and vomiting, and has  been intolerant to any p.o. intake.  He has multiple medical problems  including coronary artery disease, status post CABG, diabetes, hypertension,  and hyperlipidemia.  Also has a history of gastroesophageal reflux disease.  His cardiac status has been stable.  The patient has no prior history of  abdominal surgery or history of ileus or bowel obstruction.   PHYSICAL EXAMINATION:  VITAL SIGNS:  T-max 99.7.  GENERAL:  Well-developed male, nausea but no acute distress, a nasogastric  tube in place.  NECK:  There is no neck vein distension.  CHEST:  Clear anterolaterally.  CARDIOVASCULAR:  Regular rhythm without murmur.  There is no tachycardia.  ABDOMEN:  Distended, tympanitic, and slightly tender, especially in the  lower abdominal regions.  Bowel sounds were high pitched.  EXTREMITIES:  Status post right total knee replacement surgery.  There is no  right leg edema.   Acute obstructive series revealed distention of both large and small bowel.   IMPRESSION:  Ileus versus obstruction.   RECOMMENDATIONS:  We will make the patient NPO and support with IV fluids.  A nasogastric tube has been placed, and we will suction intermittently.  We  will follow capillary blood sugars every 6 hours and cover with Humulog  sliding-scale insulin.  A final obstructive series will be rechecked in the  morning.  We will  attempt to minimize narcotics and also follow serial  electrolytes.  We will consider a CT abdominal scan and/or surgical consult  if there is not prompt clinical improvement.  At the present time, it is  felt that the patient will respond to conservative medical management,  maintained on Coumadin deep venous thrombosis prophylaxis since surgery  seems unlikely at the present time. We will follow along with you.  Thank  you for the opportunity to participate in Mr. Ramer's care.           ______________________________  Gordy Savers, M.D. [facility]     PFK/MEDQ  D:  05/04/2005  T:  05/05/2005  Job:  [id number]

## 2010-08-04 NOTE — Op Note (Signed)
[facility]  Patient:    Luis Yu, Luis Yu Visit Number: [id number] MRN: [id number]          Service Type: SUR Location: 4W 0477 01 Attending Physician:  Loanne Drilling Dictated by:   Ollen Gross, M.D. Proc. Date: 07/14/01 Admit Date:  07/14/2001                             Operative Report  PREOPERATIVE DIAGNOSIS:  Osteoarthritis left knee.  POSTOPERATIVE DIAGNOSIS:  Osteoarthritis left knee.  PROCEDURE:  Left total knee arthroplasty.  SURGEON:  Ollen Gross, M.D.  ASSISTANT:  Alexzandrew L. Perkins, P.A.-C.  ANESTHESIA:  Spinal.  ESTIMATED BLOOD LOSS:  Minimal.  DRAIN:  Hemovac x 1.  TOURNIQUET TIME:  54 minutes at 350 mmHg.  COMPLICATIONS:  None.  CONDITION:  Stable to recovery.  BRIEF CLINICAL NOTE:  Mr. Seith is a 65 year old male with significant osteoarthritis of his left knee with pain refractory to nonoperative management including injections. He has had recurrent effusions, has significant medial and patellofemoral disease and presents now for total knee arthroplasty.  PROCEDURE IN DETAIL:  After successful administration of spinal anesthetic, the patient is placed supine on the operating table and a tourniquet placed high on his left thigh. Left lower extremity is then prepped and draped in the usual sterile fashion. Extremity is wrapped in Esmarch, knee flexed and tourniquet inflated 350 mmHg. Standard midline incision is made with a #10 blade through subcutaneous tissue to the level of the extensor mechanism. A fresh blade is used to make a medial parapatellar arthrotomy. Soft tissue of the proximomedial tibia is subperiosteally elevated to the joint line with the knife and to the semimembranosus bursa with a curved osteotome. Soft tissue over the proximolateral tibia is also elevated with attention being paid to avoiding the patellar tendon on tibial tubercle. Lateral patellofemoral ligament is cut, patella everted, knee  flexed 90 degrees and ACL and PCL removed.  Drill is used to create a starting hole in the distal femur, canal is irrigated, and a five degree left valgus alignment guide is placed. Referencing off the posterior condyles rotation is marked and block pinned to remove 10 mm off the distal femur. Distal femoral resection is made with an oscillating saw. The sizing block is placed and a size 5 is most appropriate. Rotation is marked with epicondylar axis and AP block pinned and anterior and posterior cuts made. The tibia is then subluxed forward and the menisci removed. Extramedullary tibial alignment guide is placed referencing proximally at the medial aspect of the tibial tubercle and distally along the second metatarsal axis and tibial crest. This block is pinned to remove 10 mm off the nondeficient lateral side. Tibial resection is then made with an oscillating saw. Size 5 is the most appropriate tibia and then the proximal tibia is prepared with the modular drill and keel punch. The size 5 cutting block is placed for the intercondylar and chamfer cuts on the distal femur and those cuts are subsequently made. Size 5 posterior stabilized femoral trial, size 5 mobile bearing tibial tray, 10 mm posterior stabilized rotating platform insert are all placed. He has a tiny bit of varus-valgus play. We went to a 12.5 and he has got full extension and great stability in varus-valgus direction all the way past 120 degrees of flexion. The knee was balanced extremely well. The patella is then everted, thickness measured to be 25 mm, free hand  resection taken down to 14 mm then a 41 template is placed, lug holes are drilled, trial patella placed and tracks normally. The trials are removed except for the femoral trial and then the osteophytes are taken off the posterior femur with the trial in place. The trial is then removed, cut bone surfaces are prepared with pulsatile lavage. Once the cement  is ready, then the size 5 mobile bearing tibial tray, size 5 posterior stabilized femur and 41 patella are cemented into place, and patella is held with a clamp. A 12.5 mm trial is placed, knee held in full extension, all extruded cement removed. Once the cement is fully hardened, then the permanent 12.5 mm posterior stabilized rotating platform insert is placed into the trial tray. Once again, there was excellent balance throughout range of motion. The wound is copiously irrigated with antibiotic solution and the extensor mechanism closed over a Hemovac drain with interrupted #1 PDS. Subcutaneous is closed with interrupted 2-0 Vicryl, subcuticular with running 4-0 Monocryl. Incision clean and dry and Steri-Strips and a bulky sterile dressing applied. He is placed into a knee immobilizer, awakened, and transported to recovery in stable condition. Dictated by:   Ollen Gross, M.D. Attending Physician:  Loanne Drilling DD:  07/14/01 TD:  07/14/01 Job: [id number]

## 2010-08-24 ENCOUNTER — Other Ambulatory Visit: Payer: Self-pay | Admitting: *Deleted

## 2010-08-24 ENCOUNTER — Other Ambulatory Visit: Payer: Self-pay | Admitting: Internal Medicine

## 2010-08-24 LAB — HEMOGLOBIN A1C
Hgb A1c MFr Bld: 8.3 % — ABNORMAL HIGH (ref <5.7)
Mean Plasma Glucose: 192 mg/dL — ABNORMAL HIGH (ref <117)

## 2010-08-29 ENCOUNTER — Ambulatory Visit: Payer: Medicare Other | Admitting: Internal Medicine

## 2010-08-29 ENCOUNTER — Encounter: Payer: Self-pay | Admitting: Internal Medicine

## 2010-08-29 ENCOUNTER — Encounter: Payer: Self-pay | Admitting: Family

## 2010-08-29 ENCOUNTER — Ambulatory Visit (INDEPENDENT_AMBULATORY_CARE_PROVIDER_SITE_OTHER): Payer: Medicare Other | Admitting: Family

## 2010-08-29 VITALS — BP 116/74 | HR 72 | Temp 98.0°F | Resp 20 | Wt 249.0 lb

## 2010-08-29 DIAGNOSIS — K219 Gastro-esophageal reflux disease without esophagitis: Secondary | ICD-10-CM

## 2010-08-29 DIAGNOSIS — E785 Hyperlipidemia, unspecified: Secondary | ICD-10-CM

## 2010-08-29 DIAGNOSIS — S30860A Insect bite (nonvenomous) of lower back and pelvis, initial encounter: Secondary | ICD-10-CM

## 2010-08-29 DIAGNOSIS — B354 Tinea corporis: Secondary | ICD-10-CM

## 2010-08-29 MED ORDER — BUPROPION HCL ER (XL) 150 MG PO TB24: 150.0000 mg | ORAL_TABLET | Freq: Every day | ORAL | Status: DC

## 2010-08-29 MED ORDER — GABAPENTIN 300 MG PO CAPS: 300.0000 mg | ORAL_CAPSULE | Freq: Every evening | ORAL | Status: DC | PRN

## 2010-08-29 MED ORDER — METFORMIN HCL 1000 MG PO TABS: 1000.0000 mg | ORAL_TABLET | Freq: Two times a day (BID) | ORAL | Status: DC

## 2010-08-29 MED ORDER — BUTENAFINE HCL 1 % EX CREA: TOPICAL_CREAM | CUTANEOUS | Status: DC

## 2010-08-29 MED ORDER — LISINOPRIL-HYDROCHLOROTHIAZIDE 20-12.5 MG PO TABS: 1.0000 | ORAL_TABLET | Freq: Every day | ORAL | Status: DC

## 2010-08-29 MED ORDER — ATORVASTATIN CALCIUM 80 MG PO TABS: 80.0000 mg | ORAL_TABLET | Freq: Every day | ORAL | Status: DC

## 2010-08-29 MED ORDER — OMEPRAZOLE 40 MG PO CPDR: 40.0000 mg | DELAYED_RELEASE_CAPSULE | Freq: Every day | ORAL | Status: DC

## 2010-08-29 NOTE — Progress Notes (Signed)
Subjective:    Patient ID: Luis Yu, male    DOB: 09-29-1945, 65 y.o.   MRN: [id number]  HPI  DM2-  Reports that he has a poor diet.  Likes to eat sweets.  Using lantus 20units in the AM 10HS.  Apidra- 15 units before eating.  He takes this 3x a day.  He had a sugar of 76 on Sunday- felt shakey- this happened in the afternoon.  Fasting sugars about 145.  Sometimes he forgets to take his Apidra.  Skin rash on his right buttock  GERD- wants to go back on generic prilosec.    Insect bite-  Honey bees stung him.  Using benadryl.  No tongue/lip swelling.  Review of Systems See history of present illness  Past Medical History  Diagnosis Date  . Depression     type II, uncontrolled  . Hypertension   . Hyperlipidemia   . GERD (gastroesophageal reflux disease)   . CAD (coronary artery disease)   . Osteoarthritis   . Postherpetic neuralgia   . Herpes zoster   . Adjustment disorder with mixed emotional features   . Diabetic peripheral neuropathy   . Erectile dysfunction   . Obesity     History   Social History  . Marital Status: Married    Spouse Name: N/A    Number of Children: N/A  . Years of Education: N/A   Occupational History  . Retired Naval architect    Social History Main Topics  . Smoking status: Never Smoker   . Smokeless tobacco: Not on file  . Alcohol Use: Yes  . Drug Use: Not on file  . Sexually Active: Not on file   Other Topics Concern  . Not on file   Social History Narrative   married    Past Surgical History  Procedure Date  . Coronary artery bypass graft 1996    eith a LIMA to the LAD, saphenous vein graft to the acute marginal, saphenous vein graft to the PDA and saphenous vein graft to the circumflex.  . Knee arthroscopy 05/2004    right knee  . Replacement total knee bilateral   . Shoulder surgery     Family History  Problem Relation Age of Onset  . Coronary artery disease Mother     deceased at 33  . Coronary artery disease Father       alive  . Stroke      half brother  . Stroke Brother     No Known Allergies  Current Outpatient Prescriptions on File Prior to Visit  Medication Sig Dispense Refill  . aspirin 325 MG EC tablet Take 325 mg by mouth daily.        [name] BYSTOLIC 2.5 MG tablet 2.5 mg daily.       [name] glucose blood (ACCU-CHEK AVIVA) test strip Use to check blood sugar 5 times a day as instructed       . insulin glargine (LANTUS SOLOSTAR) 100 UNIT/ML injection Inject 20 units in the morning and 15 units in the evening subcutaneously.      . insulin glulisine (APIDRA SOLOSTAR) 100 UNIT/ML injection Inject 5-10 Units into the skin 3 (three) times daily before meals.        . Insulin Pen Needle (PEN NEEDLES 31GX5/16") 31G X 8 MM MISC Use to inject insulin 5 times a day.       . Lancets (ACCU-CHEK MULTICLIX) lancets by Other route. Use up to 5 times a day to check blood  sugar.       . polyethylene glycol (MIRALAX / GLYCOLAX) packet Take 17 g by mouth daily. 17 grams mixed with water two times a day.       . prasugrel (EFFIENT) 10 MG TABS Take 10 mg by mouth daily.          BP 116/74  Pulse 72  Temp(Src) 98 F (36.7 C) (Oral)  Resp 20  Wt 249 lb (112.946 kg)       Objective:   Physical Exam  Constitutional: He appears well-developed and well-nourished.  Cardiovascular: Normal rate and regular rhythm.   Pulmonary/Chest: Effort normal and breath sounds normal.  Skin:       Annular lesion noted on left buttock with an erythematous ring. He is known to have several white lesions on chest which appear to be healing sting marks.          Assessment & Plan:  lantus solostar pen samples given 2- boxes [phone]

## 2010-08-29 NOTE — Patient Instructions (Signed)
Follow up with Dr. Artist Pais in 3 months.

## 2010-09-05 ENCOUNTER — Encounter: Payer: Self-pay | Admitting: Family

## 2010-09-05 ENCOUNTER — Ambulatory Visit (INDEPENDENT_AMBULATORY_CARE_PROVIDER_SITE_OTHER): Payer: Medicare Other | Admitting: Family

## 2010-09-05 VITALS — BP 136/74 | HR 66 | Temp 97.9°F | Resp 16 | Ht 74.0 in | Wt 247.0 lb

## 2010-09-05 DIAGNOSIS — T148XXA Other injury of unspecified body region, initial encounter: Secondary | ICD-10-CM

## 2010-09-05 DIAGNOSIS — Z5181 Encounter for therapeutic drug level monitoring: Secondary | ICD-10-CM

## 2010-09-05 DIAGNOSIS — T148 Other injury of unspecified body region: Secondary | ICD-10-CM

## 2010-09-05 DIAGNOSIS — W57XXXA Bitten or stung by nonvenomous insect and other nonvenomous arthropods, initial encounter: Secondary | ICD-10-CM

## 2010-09-05 DIAGNOSIS — B354 Tinea corporis: Secondary | ICD-10-CM | POA: Insufficient documentation

## 2010-09-05 LAB — BASIC METABOLIC PANEL
BUN: 13 mg/dL (ref 6–23)
CO2: 26 mEq/L (ref 19–32)
Calcium: 9.5 mg/dL (ref 8.4–10.5)
Chloride: 104 mEq/L (ref 96–112)
Creat: 0.92 mg/dL (ref 0.50–1.35)
Glucose, Bld: 88 mg/dL (ref 70–99)
Potassium: 4.7 mEq/L (ref 3.5–5.3)
Sodium: 142 mEq/L (ref 135–145)

## 2010-09-05 LAB — HEPATIC FUNCTION PANEL
ALT: 16 U/L (ref 0–53)
AST: 19 U/L (ref 0–37)
Albumin: 4.3 g/dL (ref 3.5–5.2)
Alkaline Phosphatase: 74 U/L (ref 39–117)
Bilirubin, Direct: 0.2 mg/dL (ref 0.0–0.3)
Indirect Bilirubin: 0.5 mg/dL (ref 0.0–0.9)
Total Bilirubin: 0.7 mg/dL (ref 0.3–1.2)
Total Protein: 6.5 g/dL (ref 6.0–8.3)

## 2010-09-05 MED ORDER — TERBINAFINE HCL 250 MG PO TABS: 250.0000 mg | ORAL_TABLET | Freq: Every day | ORAL | Status: DC

## 2010-09-05 MED ORDER — DOXYCYCLINE HYCLATE 100 MG PO CAPS: 100.0000 mg | ORAL_CAPSULE | Freq: Two times a day (BID) | ORAL | Status: AC

## 2010-09-05 NOTE — Assessment & Plan Note (Signed)
Plan to generic Prilosec.

## 2010-09-05 NOTE — Assessment & Plan Note (Signed)
We'll plan to treat with topical Lamisil.

## 2010-09-05 NOTE — Assessment & Plan Note (Signed)
No improvement with topical Lamisil. Will start oral terbinafine. Check baseline creatinine and LFTs today.

## 2010-09-05 NOTE — Patient Instructions (Signed)
Call if you develop fever, joint pain, or increased redness surrounding your tick bite. Call if your ring worm is not improved in 2 weeks. Follow up in 1 month.

## 2010-09-05 NOTE — Progress Notes (Signed)
Subjective:    Patient ID: Luis Yu, male    DOB: Jan 18, 1946, 65 y.o.   MRN: [id number]  HPI  [name] is a 65 yr old male who presents today for revevaluation of his ring worm and to have a tick bite evaluated.  Tick bite top of right leg Saturday night.  Now site is sore and painful. He denies rashes- except ringworm.    Ringworm- he has been applying topical lamisil twice daily to the affected area on his left buttock without improvement.   Review of Systems    see HPI  Past Medical History  Diagnosis Date  . Depression     type II, uncontrolled  . Hypertension   . Hyperlipidemia   . GERD (gastroesophageal reflux disease)   . CAD (coronary artery disease)   . Osteoarthritis   . Postherpetic neuralgia   . Herpes zoster   . Adjustment disorder with mixed emotional features   . Diabetic peripheral neuropathy   . Erectile dysfunction   . Obesity     History   Social History  . Marital Status: Married    Spouse Name: N/A    Number of Children: N/A  . Years of Education: N/A   Occupational History  . Retired Naval architect    Social History Main Topics  . Smoking status: Never Smoker   . Smokeless tobacco: Not on file  . Alcohol Use: Yes  . Drug Use: Not on file  . Sexually Active: Not on file   Other Topics Concern  . Not on file   Social History Narrative   married    Past Surgical History  Procedure Date  . Coronary artery bypass graft 1996    eith a LIMA to the LAD, saphenous vein graft to the acute marginal, saphenous vein graft to the PDA and saphenous vein graft to the circumflex.  . Knee arthroscopy 05/2004    right knee  . Replacement total knee bilateral   . Shoulder surgery     Family History  Problem Relation Age of Onset  . Coronary artery disease Mother     deceased at 73  . Coronary artery disease Father     alive  . Stroke      half brother  . Stroke Brother     No Known Allergies  Current Outpatient Prescriptions on File  Prior to Visit  Medication Sig Dispense Refill  . aspirin 325 MG EC tablet Take 325 mg by mouth daily.        [name] atorvastatin (LIPITOR) 80 MG tablet Take 1 tablet (80 mg total) by mouth daily.  90 tablet  0  . buPROPion (WELLBUTRIN XL) 150 MG 24 hr tablet Take 1 tablet (150 mg total) by mouth daily.  90 tablet  0  . Butenafine HCl (LOTRIMIN ULTRA) 1 % cream Apply twice daily to the affected area for 2-3 weeks.  24 g  0  . BYSTOLIC 2.5 MG tablet 2.5 mg daily.       [name] gabapentin (NEURONTIN) 300 MG capsule Take 1 capsule (300 mg total) by mouth at bedtime as needed. For neuropathy.  90 capsule  0  . glucose blood (ACCU-CHEK AVIVA) test strip Use to check blood sugar 5 times a day as instructed       . insulin glargine (LANTUS SOLOSTAR) 100 UNIT/ML injection Inject 20 units in the morning and 15 units in the evening subcutaneously.      . insulin glulisine (APIDRA SOLOSTAR) 100  UNIT/ML injection Inject 5-10 Units into the skin 3 (three) times daily before meals.        . Insulin Pen Needle (PEN NEEDLES 31GX5/16") 31G X 8 MM MISC Use to inject insulin 5 times a day.       . Lancets (ACCU-CHEK MULTICLIX) lancets by Other route. Use up to 5 times a day to check blood sugar.       . lisinopril-hydrochlorothiazide (PRINZIDE,ZESTORETIC) 20-12.5 MG per tablet Take 1 tablet by mouth daily.  90 tablet  0  . metFORMIN (GLUCOPHAGE) 1000 MG tablet Take 1 tablet (1,000 mg total) by mouth 2 (two) times daily with a meal.  180 tablet  0  . omeprazole (PRILOSEC) 40 MG capsule Take 1 capsule (40 mg total) by mouth daily.  90 capsule  0  . polyethylene glycol (MIRALAX / GLYCOLAX) packet Take 17 g by mouth daily. 17 grams mixed with water two times a day.       . prasugrel (EFFIENT) 10 MG TABS Take 10 mg by mouth daily.          BP 136/74  Pulse 66  Temp(Src) 97.9 F (36.6 C) (Oral)  Resp 16  Ht 6\' 2"  (1.88 m)  Wt 247 lb 0.6 oz (112.057 kg)  BMI 31.72 kg/m2    Objective:   Physical Exam  Constitutional: He  appears well-developed and well-nourished.  Cardiovascular: Normal rate and regular rhythm.   Pulmonary/Chest: Effort normal.  Skin:          1inch wide (aprox) area of erythema at top of right leg (anterior).  Small skin abrasion on right shin with slight surrounding erythema.   Anular lesion on left buttock remains erythematous now with slightly larger diameter.   Psychiatric: He has a normal mood and affect. His speech is normal.          Assessment & Plan:

## 2010-09-05 NOTE — Assessment & Plan Note (Signed)
suspect local infection at the site of tick bite which is mild at this point. He does have an abrasion on the right shin with mild surrounding erythema which appears to be a very early cellulitis. Will plan to treat him with doxycycline to help with both of these lesions. He is instructed to contact us should his symptoms worsen or not improve

## 2010-09-06 ENCOUNTER — Telehealth: Payer: Self-pay | Admitting: Internal Medicine

## 2010-09-06 ENCOUNTER — Encounter: Payer: Self-pay | Admitting: Family

## 2010-09-06 NOTE — Telephone Encounter (Signed)
Call placed to patient at [phone], he was informed per Sandford Craze instructions.

## 2010-09-06 NOTE — Telephone Encounter (Signed)
Patient would like lab results.

## 2010-09-06 NOTE — Telephone Encounter (Signed)
Liver and kidney function is normal.  Please notify patient.

## 2010-10-10 ENCOUNTER — Ambulatory Visit (INDEPENDENT_AMBULATORY_CARE_PROVIDER_SITE_OTHER): Payer: Medicare Other | Admitting: Family

## 2010-10-10 ENCOUNTER — Encounter: Payer: Self-pay | Admitting: Family

## 2010-10-10 DIAGNOSIS — F4323 Adjustment disorder with mixed anxiety and depressed mood: Secondary | ICD-10-CM

## 2010-10-10 DIAGNOSIS — B354 Tinea corporis: Secondary | ICD-10-CM

## 2010-10-10 MED ORDER — INSULIN GLULISINE 100 UNIT/ML ~~LOC~~ SOLN: 5.0000 [IU] | Freq: Three times a day (TID) | SUBCUTANEOUS | Status: DC

## 2010-10-10 MED ORDER — NEBIVOLOL HCL 2.5 MG PO TABS: 2.5000 mg | ORAL_TABLET | Freq: Every day | ORAL | Status: DC

## 2010-10-10 MED ORDER — INSULIN GLARGINE 100 UNIT/ML ~~LOC~~ SOLN: SUBCUTANEOUS | Status: DC

## 2010-10-10 MED ORDER — BUPROPION HCL ER (XL) 300 MG PO TB24: 300.0000 mg | ORAL_TABLET | Freq: Every day | ORAL | Status: DC

## 2010-10-10 NOTE — Assessment & Plan Note (Signed)
Anxiety/depression- deteriorated.  Will try increasing wellbutrin to 300mg  daily.  Patient's wife tells me that he has been on SSRIs in the past and did not see much improvement. She also tells me that he has been treated in the past but that he became "zombie like". The patient is instructed to followup in one month. 15 minutes are spent with the patient today greater than 50% of this time was spent counseling the patient on his anxiety and depression.

## 2010-10-10 NOTE — Patient Instructions (Signed)
Please follow up in 1 month.  

## 2010-10-10 NOTE — Progress Notes (Signed)
Subjective:    Patient ID: Luis Yu, male    DOB: Jul 17, 1945, 65 y.o.   MRN: [id number]  HPI  Ring worm- resolved with terbinafine.  Anxiety/depression-  Feels like wellbutrin is not working well.  Feels more irritable.  He has been on wellbutrin since 9/11.  Wife reports mood swings.  Notes + anxiety,  Has trouble staying focused on things.  Sleeping poorly,  Takes 30-60 minutes to fall asleep.  He has not been able to tolerate his CPAP.  Appetite- good, has lost 4 pounds intentionally.    Review of Systems See HPI  Past Medical History  Diagnosis Date  . Depression     type II, uncontrolled  . Hypertension   . Hyperlipidemia   . GERD (gastroesophageal reflux disease)   . CAD (coronary artery disease)   . Osteoarthritis   . Postherpetic neuralgia   . Herpes zoster   . Adjustment disorder with mixed emotional features   . Diabetic peripheral neuropathy   . Erectile dysfunction   . Obesity     History   Social History  . Marital Status: Married    Spouse Name: N/A    Number of Children: N/A  . Years of Education: N/A   Occupational History  . Retired Naval architect    Social History Main Topics  . Smoking status: Never Smoker   . Smokeless tobacco: Not on file  . Alcohol Use: Yes  . Drug Use: Not on file  . Sexually Active: Not on file   Other Topics Concern  . Not on file   Social History Narrative   married    Past Surgical History  Procedure Date  . Coronary artery bypass graft 1996    eith a LIMA to the LAD, saphenous vein graft to the acute marginal, saphenous vein graft to the PDA and saphenous vein graft to the circumflex.  . Knee arthroscopy 05/2004    right knee  . Replacement total knee bilateral   . Shoulder surgery     Family History  Problem Relation Age of Onset  . Coronary artery disease Mother     deceased at 71  . Coronary artery disease Father     alive  . Stroke      half brother  . Stroke Brother     No Known  Allergies  Current Outpatient Prescriptions on File Prior to Visit  Medication Sig Dispense Refill  . aspirin 325 MG EC tablet Take 325 mg by mouth daily.        [name] atorvastatin (LIPITOR) 80 MG tablet Take 1 tablet (80 mg total) by mouth daily.  90 tablet  0  . buPROPion (WELLBUTRIN XL) 150 MG 24 hr tablet Take 1 tablet (150 mg total) by mouth daily.  90 tablet  0  . Butenafine HCl (LOTRIMIN ULTRA) 1 % cream Apply twice daily to the affected area for 2-3 weeks.  24 g  0  . gabapentin (NEURONTIN) 300 MG capsule Take 1 capsule (300 mg total) by mouth at bedtime as needed. For neuropathy.  90 capsule  0  . glucose blood (ACCU-CHEK AVIVA) test strip Use to check blood sugar 5 times a day as instructed       . Insulin Pen Needle (PEN NEEDLES 31GX5/16") 31G X 8 MM MISC Use to inject insulin 5 times a day.       . Lancets (ACCU-CHEK MULTICLIX) lancets by Other route. Use up to 5 times a day to check blood  sugar.       . lisinopril-hydrochlorothiazide (PRINZIDE,ZESTORETIC) 20-12.5 MG per tablet Take 1 tablet by mouth daily.  90 tablet  0  . metFORMIN (GLUCOPHAGE) 1000 MG tablet Take 1 tablet (1,000 mg total) by mouth 2 (two) times daily with a meal.  180 tablet  0  . omeprazole (PRILOSEC) 40 MG capsule Take 1 capsule (40 mg total) by mouth daily.  90 capsule  0  . polyethylene glycol (MIRALAX / GLYCOLAX) packet Take 17 g by mouth daily. 17 grams mixed with water two times a day.       . prasugrel (EFFIENT) 10 MG TABS Take 10 mg by mouth daily.          BP 120/76  Pulse 84  Temp(Src) 97.7 F (36.5 C) (Oral)  Resp 16  Ht 6\' 2"  (1.88 m)  Wt 243 lb 1.3 oz (110.26 kg)  BMI 31.21 kg/m2  .    Objective:   Physical Exam  Constitutional: He appears well-developed and well-nourished.  Cardiovascular: Normal rate and regular rhythm.   Pulmonary/Chest: Effort normal and breath sounds normal.  Psychiatric: He has a normal mood and affect. His behavior is normal. Judgment and thought content normal.           Assessment & Plan:

## 2010-10-10 NOTE — Assessment & Plan Note (Signed)
Resolved

## 2010-10-11 ENCOUNTER — Telehealth: Payer: Self-pay | Admitting: *Deleted

## 2010-10-11 MED ORDER — INSULIN GLULISINE 100 UNIT/ML ~~LOC~~ SOLN: 5.0000 [IU] | Freq: Three times a day (TID) | SUBCUTANEOUS | Status: DC

## 2010-10-11 MED ORDER — INSULIN GLARGINE 100 UNIT/ML ~~LOC~~ SOLN: SUBCUTANEOUS | Status: DC

## 2010-10-11 NOTE — Telephone Encounter (Signed)
Spoke to [facility] at [facility] and changed Apidra quantity to 45mL and lantus quantity to 45mL with 1 refill each.

## 2010-10-11 NOTE — Telephone Encounter (Signed)
Message copied by Kathi Simpers on Wed Oct 11, 2010  2:33 PM ------      Message from: O'SULLIVAN, [name]      Created: Tue Oct 10, 2010  5:13 PM       Would you mind pls calling [facility] and changing the apidra and lantus to a 90 day supply.  I sent 30 by accident. thanks

## 2010-11-10 ENCOUNTER — Ambulatory Visit: Payer: Medicare Other | Admitting: Family

## 2010-11-15 ENCOUNTER — Other Ambulatory Visit: Payer: Self-pay | Admitting: *Deleted

## 2010-11-15 MED ORDER — PRASUGREL HCL 10 MG PO TABS: 10.0000 mg | ORAL_TABLET | Freq: Every day | ORAL | Status: DC

## 2010-11-15 NOTE — Telephone Encounter (Signed)
Pt is currently at [facility]. [facility] [address] requesting refill for pt's Effient.

## 2010-11-16 NOTE — Telephone Encounter (Signed)
[facility] and verified that they can pull refills [facility] in [place] [facility] as refills were sent to pt's home pharmacy.

## 2010-11-30 ENCOUNTER — Ambulatory Visit: Payer: Medicare Other | Admitting: Internal Medicine

## 2010-12-04 ENCOUNTER — Telehealth: Payer: Self-pay | Admitting: Family

## 2010-12-04 ENCOUNTER — Telehealth: Payer: Self-pay | Admitting: Internal Medicine

## 2010-12-04 DIAGNOSIS — E785 Hyperlipidemia, unspecified: Secondary | ICD-10-CM

## 2010-12-04 DIAGNOSIS — I10 Essential (primary) hypertension: Secondary | ICD-10-CM

## 2010-12-04 MED ORDER — METFORMIN HCL 1000 MG PO TABS: 1000.0000 mg | ORAL_TABLET | Freq: Two times a day (BID) | ORAL | Status: DC

## 2010-12-04 NOTE — Telephone Encounter (Signed)
Refill-metformin hcl 1000mg  tablet. Take one tablet by mouth twice a day. Qty 180. Last fill 6.23.12

## 2010-12-04 NOTE — Telephone Encounter (Signed)
Patient made a follow up visit with Dr. Artist Pais on 12/15/10.

## 2010-12-04 NOTE — Telephone Encounter (Signed)
Please call pt to arrange f/u. He was advised to f/u in August and is past due. Refill has been sent to pharmacy for Metformin.

## 2010-12-04 NOTE — Telephone Encounter (Signed)
Patient made an appt with Dr. Artist Pais on 12/15/10. Patient would like to know if he needs labs prior to this appt?

## 2010-12-05 NOTE — Telephone Encounter (Signed)
BMET, A1c - 250.02 FLP - 272.4

## 2010-12-05 NOTE — Telephone Encounter (Signed)
Future orders entered, pt needs to make lab appt

## 2010-12-08 ENCOUNTER — Other Ambulatory Visit (INDEPENDENT_AMBULATORY_CARE_PROVIDER_SITE_OTHER): Payer: Medicare Other

## 2010-12-08 DIAGNOSIS — I10 Essential (primary) hypertension: Secondary | ICD-10-CM

## 2010-12-08 DIAGNOSIS — E785 Hyperlipidemia, unspecified: Secondary | ICD-10-CM

## 2010-12-08 LAB — LIPID PANEL
Cholesterol: 133 mg/dL (ref 0–200)
HDL: 48.2 mg/dL (ref >39.00)
LDL Cholesterol: 72 mg/dL (ref 0–99)
Total CHOL/HDL Ratio: 3
Triglycerides: 65 mg/dL (ref 0.0–149.0)
VLDL: 13 mg/dL (ref 0.0–40.0)

## 2010-12-08 LAB — BASIC METABOLIC PANEL
BUN: 21 mg/dL (ref 6–23)
CO2: 28 mEq/L (ref 19–32)
Calcium: 8.8 mg/dL (ref 8.4–10.5)
Chloride: 106 mEq/L (ref 96–112)
Creatinine, Ser: 1 mg/dL (ref 0.4–1.5)
GFR: 75.83 mL/min (ref >60.00)
Glucose, Bld: 108 mg/dL — ABNORMAL HIGH (ref 70–99)
Potassium: 3.9 mEq/L (ref 3.5–5.1)
Sodium: 143 mEq/L (ref 135–145)

## 2010-12-08 LAB — HEMOGLOBIN A1C: Hgb A1c MFr Bld: 8.3 % — ABNORMAL HIGH (ref 4.6–6.5)

## 2010-12-08 NOTE — Progress Notes (Signed)
Addended by: Rita Ohara R on: 12/08/2010 09:54 AM   Modules accepted: Orders

## 2010-12-15 ENCOUNTER — Other Ambulatory Visit: Payer: Self-pay | Admitting: *Deleted

## 2010-12-15 ENCOUNTER — Encounter: Payer: Self-pay | Admitting: Internal Medicine

## 2010-12-15 ENCOUNTER — Ambulatory Visit (INDEPENDENT_AMBULATORY_CARE_PROVIDER_SITE_OTHER): Payer: Medicare Other | Admitting: Internal Medicine

## 2010-12-15 ENCOUNTER — Other Ambulatory Visit: Payer: Self-pay | Admitting: Family

## 2010-12-15 VITALS — BP 138/80 | Temp 98.0°F | Wt 244.0 lb

## 2010-12-15 DIAGNOSIS — Z23 Encounter for immunization: Secondary | ICD-10-CM

## 2010-12-15 DIAGNOSIS — F4323 Adjustment disorder with mixed anxiety and depressed mood: Secondary | ICD-10-CM

## 2010-12-15 DIAGNOSIS — IMO0001 Reserved for inherently not codable concepts without codable children: Secondary | ICD-10-CM

## 2010-12-15 DIAGNOSIS — I10 Essential (primary) hypertension: Secondary | ICD-10-CM

## 2010-12-15 DIAGNOSIS — R0602 Shortness of breath: Secondary | ICD-10-CM

## 2010-12-15 DIAGNOSIS — E785 Hyperlipidemia, unspecified: Secondary | ICD-10-CM

## 2010-12-15 MED ORDER — ATORVASTATIN CALCIUM 80 MG PO TABS: 80.0000 mg | ORAL_TABLET | Freq: Every day | ORAL | Status: DC

## 2010-12-15 MED ORDER — GABAPENTIN 300 MG PO CAPS: 300.0000 mg | ORAL_CAPSULE | Freq: Every evening | ORAL | Status: DC | PRN

## 2010-12-15 MED ORDER — BUPROPION HCL ER (XL) 300 MG PO TB24: 300.0000 mg | ORAL_TABLET | Freq: Every day | ORAL | Status: DC

## 2010-12-15 MED ORDER — INSULIN GLARGINE 100 UNIT/ML ~~LOC~~ SOLN: SUBCUTANEOUS | Status: DC

## 2010-12-15 MED ORDER — LISINOPRIL-HYDROCHLOROTHIAZIDE 20-12.5 MG PO TABS: 1.0000 | ORAL_TABLET | Freq: Every day | ORAL | Status: DC

## 2010-12-15 MED ORDER — INSULIN GLULISINE 100 UNIT/ML ~~LOC~~ SOLN: 10.0000 [IU] | Freq: Three times a day (TID) | SUBCUTANEOUS | Status: DC

## 2010-12-15 MED ORDER — METFORMIN HCL 1000 MG PO TABS: 1000.0000 mg | ORAL_TABLET | Freq: Two times a day (BID) | ORAL | Status: DC

## 2010-12-15 MED ORDER — OMEPRAZOLE 40 MG PO CPDR: 40.0000 mg | DELAYED_RELEASE_CAPSULE | Freq: Every day | ORAL | Status: DC

## 2010-12-15 MED ORDER — NEBIVOLOL HCL 2.5 MG PO TABS: 2.5000 mg | ORAL_TABLET | Freq: Every day | ORAL | Status: DC

## 2010-12-15 NOTE — Assessment & Plan Note (Signed)
Pt still having trouble with concept of adjusting insulin dose to match carb intake.  Reviewed with wife.  I advised pt take 20 units of apidra esp when he dining out with friends and having dessert.  A1c is stable.  I don't think A1c goal < 7 is realistic.  I suggest A1c goal of < 8.   We discussed importance of avoiding hypoglycemia.  Pt advised to eat snack (complex carb) when he works outside.  Lab Results  Component Value Date   HGBA1C 8.3* 12/08/2010

## 2010-12-15 NOTE — Progress Notes (Signed)
Subjective:    Patient ID: Luis Yu, male    DOB: 1945-04-05, 65 y.o.   MRN: [id number]  Diabetes He presents for his follow-up diabetic visit. He has type 2 diabetes mellitus. His disease course has been stable. Hypoglycemia symptoms include hunger and mood changes. Pertinent negatives for diabetes include no blurred vision and no visual change. There are no hypoglycemic complications. Symptoms are stable. Diabetic complications include heart disease and impotence. Current diabetic treatment includes insulin injections, diet and oral agent (monotherapy). He is compliant with treatment most of the time. His weight is decreasing steadily. He is following a generally healthy diet. He participates in exercise intermittently. His breakfast blood glucose range is generally 130-140 mg/dl. An ACE inhibitor/angiotensin II receptor blocker is being taken.  Hypertension Pertinent negatives include no blurred vision.   Pt is using apidra 15 units tid.  He is not adjusting dose based upon carbohydrate intake.  Occasional low blood sugars occur in there afternoon after he has been working outside.    His large carb meal is at night.  When he dines with friends he always has dessert.  CAD - he denies chest pain but he reports intermittent dyspnea. He is due for f/u with Dr. Jens Som  Adjustment d/o - he is doing much better since using higher dose of wellbutrin.  Hyperlipidemia - stable.  Tolerating lipitor.   Lab results reviewed.  Review of Systems  Eyes: Negative for blurred vision.  Genitourinary: Positive for impotence.   Past Medical History  Diagnosis Date  . Depression     type II, uncontrolled  . Hypertension   . Hyperlipidemia   . GERD (gastroesophageal reflux disease)   . CAD (coronary artery disease)   . Osteoarthritis   . Postherpetic neuralgia   . Herpes zoster   . Adjustment disorder with mixed emotional features   . Diabetic peripheral neuropathy   . Erectile dysfunction     . Obesity     History   Social History  . Marital Status: Married    Spouse Name: N/A    Number of Children: N/A  . Years of Education: N/A   Occupational History  . Retired Naval architect    Social History Main Topics  . Smoking status: Never Smoker   . Smokeless tobacco: Not on file  . Alcohol Use: Yes  . Drug Use: Not on file  . Sexually Active: Not on file   Other Topics Concern  . Not on file   Social History Narrative   married    Past Surgical History  Procedure Date  . Coronary artery bypass graft 1996    eith a LIMA to the LAD, saphenous vein graft to the acute marginal, saphenous vein graft to the PDA and saphenous vein graft to the circumflex.  . Knee arthroscopy 05/2004    right knee  . Replacement total knee bilateral   . Shoulder surgery     Family History  Problem Relation Age of Onset  . Coronary artery disease Mother     deceased at 70  . Coronary artery disease Father     alive  . Stroke      half brother  . Stroke Brother     No Known Allergies  Current Outpatient Prescriptions on File Prior to Visit  Medication Sig Dispense Refill  . aspirin 325 MG EC tablet Take 325 mg by mouth daily.        [name] glucose blood (ACCU-CHEK AVIVA) test strip Use to  check blood sugar 5 times a day as instructed       . Insulin Pen Needle (PEN NEEDLES 31GX5/16") 31G X 8 MM MISC Use to inject insulin 5 times a day.       . Lancets (ACCU-CHEK MULTICLIX) lancets by Other route. Use up to 5 times a day to check blood sugar.       . polyethylene glycol (MIRALAX / GLYCOLAX) packet Take 17 g by mouth daily. 17 grams mixed with water two times a day.       . prasugrel (EFFIENT) 10 MG TABS Take 1 tablet (10 mg total) by mouth daily.  30 tablet  12    BP 138/80  Temp(Src) 98 F (36.7 C) (Oral)  Wt 244 lb (110.678 kg)     Objective:   Physical Exam   Constitutional: Appears well-developed and well-nourished. No distress.  Head: Normocephalic and atraumatic.   Neck: Normal range of motion. Neck supple. No thyromegaly present. No carotid bruit Cardiovascular: Normal rate, regular rhythm and normal heart sounds.  Exam reveals no gallop and no friction rub.   No murmur heard. Pulmonary/Chest: Effort normal and breath sounds normal.  No wheezes. No rales.  Abdominal: Soft. Bowel sounds are normal. No mass. There is no tenderness.  Neurological: Alert. No cranial nerve deficit.  Skin: Skin is warm and dry.  Psychiatric: Normal mood and affect. Behavior is normal.        Assessment & Plan:

## 2010-12-15 NOTE — Assessment & Plan Note (Signed)
Pt advised to follow up with his cardiologist

## 2010-12-15 NOTE — Assessment & Plan Note (Signed)
Improved with higher dose of wellbutrin.  Wife also notes mild improvement in his concentration

## 2010-12-15 NOTE — Assessment & Plan Note (Signed)
BP with manual cuff 122/70.  I suggest pt keep home BP log. Consider increase bystolic to 5 mg No change in ED since starting bystolic BP: 138/80 mmHg

## 2010-12-15 NOTE — Assessment & Plan Note (Addendum)
LDL goal < 70.  He is pretty close.  Continue lipitor Lab Results  Component Value Date   CHOL 133 12/08/2010   CHOL 154 04/07/2010   CHOL 141 11/30/2009   Lab Results  Component Value Date   HDL 48.20 12/08/2010   HDL 44 04/07/2010   HDL 46 2/95/6213   Lab Results  Component Value Date   LDLCALC 72 12/08/2010   LDLCALC 95 04/07/2010   LDLCALC 79 11/30/2009   Lab Results  Component Value Date   TRIG 65.0 12/08/2010   TRIG 75 04/07/2010   TRIG 78 11/30/2009   Lab Results  Component Value Date   CHOLHDL 3 12/08/2010   CHOLHDL 3.5 Ratio 04/07/2010   CHOLHDL 3.1 Ratio 11/30/2009   No results found for this basename: LDLDIRECT

## 2010-12-15 NOTE — Patient Instructions (Signed)
Keep blood pressure and pulse log and bring with you to your next appointment Take 10 units of insulin with noon meal and up to 20 units with evening meal especially if you have dessert

## 2010-12-19 ENCOUNTER — Telehealth: Payer: Self-pay | Admitting: Cardiology

## 2010-12-19 MED ORDER — PRASUGREL HCL 10 MG PO TABS: 10.0000 mg | ORAL_TABLET | Freq: Every day | ORAL | Status: DC

## 2010-12-19 NOTE — Telephone Encounter (Signed)
meds effient 10 mg requesting 90 day supply

## 2010-12-19 NOTE — Telephone Encounter (Signed)
Refill sent to pharmacy. Effient.

## 2010-12-19 NOTE — Telephone Encounter (Signed)
Pt out of effient needs refill asap

## 2011-01-26 ENCOUNTER — Ambulatory Visit: Payer: Medicare Other | Admitting: Internal Medicine

## 2011-02-07 ENCOUNTER — Encounter: Payer: Self-pay | Admitting: Internal Medicine

## 2011-02-07 ENCOUNTER — Ambulatory Visit (INDEPENDENT_AMBULATORY_CARE_PROVIDER_SITE_OTHER): Payer: Medicare Other | Admitting: Internal Medicine

## 2011-02-07 VITALS — BP 124/74 | HR 74 | Temp 98.0°F | Ht 75.0 in | Wt 250.0 lb

## 2011-02-07 DIAGNOSIS — E785 Hyperlipidemia, unspecified: Secondary | ICD-10-CM

## 2011-02-07 MED ORDER — OMEPRAZOLE 40 MG PO CPDR: 40.0000 mg | DELAYED_RELEASE_CAPSULE | Freq: Every day | ORAL | Status: DC

## 2011-02-07 MED ORDER — INSULIN GLULISINE 100 UNIT/ML ~~LOC~~ SOLN: 10.0000 [IU] | Freq: Three times a day (TID) | SUBCUTANEOUS | Status: DC

## 2011-02-07 MED ORDER — NEBIVOLOL HCL 5 MG PO TABS: 5.0000 mg | ORAL_TABLET | Freq: Every day | ORAL | Status: DC

## 2011-02-07 MED ORDER — GABAPENTIN 300 MG PO CAPS: 300.0000 mg | ORAL_CAPSULE | Freq: Every evening | ORAL | Status: DC | PRN

## 2011-02-07 MED ORDER — ATORVASTATIN CALCIUM 80 MG PO TABS: 80.0000 mg | ORAL_TABLET | Freq: Every day | ORAL | Status: DC

## 2011-02-07 MED ORDER — METFORMIN HCL 1000 MG PO TABS: 1000.0000 mg | ORAL_TABLET | Freq: Two times a day (BID) | ORAL | Status: DC

## 2011-02-07 MED ORDER — INSULIN GLARGINE 100 UNIT/ML ~~LOC~~ SOLN: SUBCUTANEOUS | Status: DC

## 2011-02-07 MED ORDER — NEBIVOLOL HCL 2.5 MG PO TABS: 2.5000 mg | ORAL_TABLET | Freq: Every day | ORAL | Status: DC

## 2011-02-07 MED ORDER — LISINOPRIL-HYDROCHLOROTHIAZIDE 20-12.5 MG PO TABS: 1.0000 | ORAL_TABLET | Freq: Every day | ORAL | Status: DC

## 2011-02-07 MED ORDER — BUPROPION HCL ER (XL) 300 MG PO TB24: 300.0000 mg | ORAL_TABLET | Freq: Every day | ORAL | Status: DC

## 2011-02-07 MED ORDER — "PEN NEEDLES 5/16"" 31G X 8 MM MISC": Status: DC

## 2011-02-07 NOTE — Progress Notes (Signed)
Subjective:    Patient ID: Doretha Imus, male    DOB: 03/16/1946, 65 y.o.   MRN: [id number]  HPI  65 year old white male with history of coronary disease and uncontrolled type 2 diabetes for followup. Despite extensive counseling patient still having difficulty using his short acting insulin on a regular basis. He has spent the last 6 weeks at [facility]. Wife reports that he has had 1 or 2 hypoglycemic episodes.  She is trying her best to remind him to use his short acting insulin before supper and also before bedtime snacks.  His morning blood sugars are usually in the 140s.  Review of Systems Negative for chest pain,  negative for changes in vision, negative for foot ulcers Past Medical History  Diagnosis Date  . Depression     type II, uncontrolled  . Hypertension   . Hyperlipidemia   . GERD (gastroesophageal reflux disease)   . CAD (coronary artery disease)   . Osteoarthritis   . Postherpetic neuralgia   . Herpes zoster   . Adjustment disorder with mixed emotional features   . Diabetic peripheral neuropathy   . Erectile dysfunction   . Obesity     History   Social History  . Marital Status: Married    Spouse Name: N/A    Number of Children: N/A  . Years of Education: N/A   Occupational History  . Retired Naval architect    Social History Main Topics  . Smoking status: Never Smoker   . Smokeless tobacco: Not on file  . Alcohol Use: Yes  . Drug Use: Not on file  . Sexually Active: Not on file   Other Topics Concern  . Not on file   Social History Narrative   married    Past Surgical History  Procedure Date  . Coronary artery bypass graft 1996    eith a LIMA to the LAD, saphenous vein graft to the acute marginal, saphenous vein graft to the PDA and saphenous vein graft to the circumflex.  . Knee arthroscopy 05/2004    right knee  . Replacement total knee bilateral   . Shoulder surgery     Family History  Problem Relation Age of Onset  . Coronary  artery disease Mother     deceased at 97  . Coronary artery disease Father     alive  . Stroke      half brother  . Stroke Brother     No Known Allergies  Current Outpatient Prescriptions on File Prior to Visit  Medication Sig Dispense Refill  . aspirin 325 MG EC tablet Take 325 mg by mouth daily.        [name] glucose blood (ACCU-CHEK AVIVA) test strip Use to check blood sugar 5 times a day as instructed       . Lancets (ACCU-CHEK MULTICLIX) lancets by Other route. Use up to 5 times a day to check blood sugar.       . polyethylene glycol (MIRALAX / GLYCOLAX) packet Take 17 g by mouth daily. 17 grams mixed with water two times a day.       . prasugrel (EFFIENT) 10 MG TABS Take 1 tablet (10 mg total) by mouth daily.  90 tablet  0    BP 124/74  Pulse 74  Temp(Src) 98 F (36.7 C) (Oral)  Ht 6\' 3"  (1.905 m)  Wt 250 lb (113.399 kg)  BMI 31.25 kg/m2       Objective:   Physical Exam  Constitutional:  Appears well-developed and well-nourished. No distress.  Head: Normocephalic and atraumatic.  Neck: Normal range of motion. Neck supple. No thyromegaly present. No carotid bruit Cardiovascular: Normal rate, regular rhythm and normal heart sounds. Exam reveals no gallop and no friction rub.  No murmur heard.  Pulmonary/Chest: Effort normal and breath sounds normal. No wheezes. No rales.  Abdominal: Soft. Bowel sounds are normal. No mass. There is no tenderness.  Neurological: Alert. No cranial nerve deficit.  Skin: Skin is warm and dry.  Psychiatric: Normal mood and affect. Behavior is normal.        Assessment & Plan:

## 2011-02-07 NOTE — Patient Instructions (Signed)
Please complete the following lab tests before your next follow up appointment:  BMET, A1c - 250.02 

## 2011-02-07 NOTE — Assessment & Plan Note (Signed)
Diabetes control limited by poor patient compliance.  His wife has agreed to try her best to assist patient in using short acting insulin before evening meals.  A1c before next OV

## 2011-02-12 ENCOUNTER — Ambulatory Visit: Payer: Medicare Other | Admitting: Internal Medicine

## 2011-03-10 ENCOUNTER — Other Ambulatory Visit: Payer: Self-pay | Admitting: Family

## 2011-03-12 NOTE — Telephone Encounter (Signed)
rx sent in electronically 

## 2011-03-15 ENCOUNTER — Telehealth: Payer: Self-pay | Admitting: Cardiology

## 2011-03-15 NOTE — Telephone Encounter (Signed)
New Msg: Pt wife calling wanting to know if Dr. Jens Som wants pt to continue taking effient. Pt is going to run our of RX prior to pt appt with Dr. Jens Som on 1/9 in [place] and if Dr. Jens Som doesn't want pt to continue taking medication pt doesn't want to get a refill bc RX is expensive. Please return pt wife call to discuss further.   Alt # [phone]

## 2011-03-15 NOTE — Telephone Encounter (Signed)
Spoke with pt, they want to know if he needs to cont effient or not. Will forward for dr Jens Som review. Pt has a follow up in [place] 03-28-11.

## 2011-03-16 NOTE — Telephone Encounter (Signed)
Follow up from previous call:  Please advise on effient . Need to know something before the 12/31 due to insurance.

## 2011-03-16 NOTE — Telephone Encounter (Signed)
F/U from previous call:   Returning nurse called from yesterday.

## 2011-03-19 ENCOUNTER — Other Ambulatory Visit: Payer: Self-pay | Admitting: *Deleted

## 2011-03-19 MED ORDER — PRASUGREL HCL 10 MG PO TABS: 10.0000 mg | ORAL_TABLET | Freq: Every day | ORAL | Status: DC

## 2011-03-19 NOTE — Telephone Encounter (Signed)
Spoke with pt wife, aware dr Jens Som will decide about effient when he sees the pt. Refill called to pharm

## 2011-03-19 NOTE — Telephone Encounter (Signed)
FU Call: pt calling needing to know if pt is going to be instructed to continue taking effient or not. Pt needs to know today. Pt only has two pills left and insurance changes tomorrow, which will double the cost of medication. Please call this rx in asap if pt needs to continue taking medication.  Please call medication into [facility] in [place] in refill is needed.

## 2011-03-28 ENCOUNTER — Ambulatory Visit: Payer: Medicare Other | Admitting: Cardiology

## 2011-03-28 ENCOUNTER — Ambulatory Visit (INDEPENDENT_AMBULATORY_CARE_PROVIDER_SITE_OTHER): Payer: Medicare Other | Admitting: Cardiology

## 2011-03-28 ENCOUNTER — Encounter: Payer: Self-pay | Admitting: Cardiology

## 2011-03-28 DIAGNOSIS — E785 Hyperlipidemia, unspecified: Secondary | ICD-10-CM | POA: Diagnosis not present

## 2011-03-28 DIAGNOSIS — I10 Essential (primary) hypertension: Secondary | ICD-10-CM

## 2011-03-28 DIAGNOSIS — I2510 Atherosclerotic heart disease of native coronary artery without angina pectoris: Secondary | ICD-10-CM

## 2011-03-28 NOTE — Assessment & Plan Note (Signed)
Continue aspirin and statin. Discontinue prasugrel. He continues to have some problems with dyspnea. I am not convinced this is completely cardiac related. Redo surgery would be at increased risk. Plan medical therapy for now.

## 2011-03-28 NOTE — Progress Notes (Signed)
HPI: Pleasant male with past medical history of coronary artery disease status post coronary artery bypass graft for fu. I have seen previously for dyspnea. Note previous carotid Dopplers in October of 2001 showed no significant stenosis. Previous abdominal ultrasound in 2003 showed no aneurysm. A previous BNP was normal at 31. A Myoview in March of 2011 revealed an ejection fraction of 47% and inferior ischemia. The RV was prominent. An echocardiogram in March of 2011 revealed normal LV function and mild left atrial enlargement. A cardiac catheterization in May of 2011 revealed triple-vessel coronary artery disease status post three-vessel CABG with 3/3 patent grafts. Severe stenosis in the proximal and distal body of the saphenous vein graft to the left-sided PDA. Normal left ventricular systolic function. Patient had PCI with a drug-eluting stent at that time. Had repeat cardiac cath in Dec of 2011 for dyspnea and f/u abnormal myoview. This revealed  an EF of 50. Continued patency internal mammary to the LAD. Occluded saphenous vein graft to the recent percutaneous intervention to the distal circumflex/PDA territory. Progressive disease in the saphenous vein graft to the OM. Patient evaluated by CVTS. Distal vessels felt to be poor targets. CABG could be considered in the future if PCI options were exhausted. Patient had PCI of the saphenous vein graft to the obtuse marginal. I last saw him Feb 2012. Since then, he has dyspnea with more moderate activities but not with routine activities. It is relieved with rest. There is no orthopnea, PND, pedal edema, palpitations, syncope or chest pain.  Current Outpatient Prescriptions  Medication Sig Dispense Refill  . aspirin 325 MG EC tablet Take 325 mg by mouth daily.        [name] atorvastatin (LIPITOR) 80 MG tablet Take 1 tablet (80 mg total) by mouth daily.  90 tablet  3  . buPROPion (WELLBUTRIN XL) 300 MG 24 hr tablet Take 1 tablet (300 mg total) by mouth daily.   90 tablet  3  . gabapentin (NEURONTIN) 300 MG capsule Take 1 capsule (300 mg total) by mouth at bedtime as needed. For neuropathy.  90 capsule  3  . glucose blood (ACCU-CHEK AVIVA) test strip Use to check blood sugar 5 times a day as instructed       . insulin glargine (LANTUS SOLOSTAR) 100 UNIT/ML injection Inject 20 units in the morning and 15 units in the evening subcutaneously.  45 mL  3  . insulin glulisine (APIDRA SOLOSTAR) 100 UNIT/ML injection Inject 10-20 Units into the skin 3 (three) times daily before meals.  45 mL  3  . Insulin Pen Needle (PEN NEEDLES 31GX5/16") 31G X 8 MM MISC Use to inject insulin 5 times a day.  100 each  11  . Lancets (ACCU-CHEK MULTICLIX) lancets by Other route. Use up to 5 times a day to check blood sugar.       . lisinopril-hydrochlorothiazide (PRINZIDE,ZESTORETIC) 20-12.5 MG per tablet Take 1 tablet by mouth daily.  90 tablet  1  . metFORMIN (GLUCOPHAGE) 1000 MG tablet Take 1 tablet (1,000 mg total) by mouth 2 (two) times daily with a meal.  180 tablet  1  . nebivolol (BYSTOLIC) 2.5 MG tablet Take 2.5 mg by mouth daily.      [name] omeprazole (PRILOSEC) 40 MG capsule TAKE ONE CAPSULE BY MOUTH EVERY DAY  90 capsule  0  . polyethylene glycol (MIRALAX / GLYCOLAX) packet Take 17 g by mouth daily. 17 grams mixed with water two times a day.       [name]  prasugrel (EFFIENT) 10 MG TABS Take 1 tablet (10 mg total) by mouth daily.  90 tablet  4     Past Medical History  Diagnosis Date  . Depression     type II, uncontrolled  . Hypertension   . Hyperlipidemia   . GERD (gastroesophageal reflux disease)   . CAD (coronary artery disease)   . Osteoarthritis   . Postherpetic neuralgia   . Herpes zoster   . Adjustment disorder with mixed emotional features   . Diabetic peripheral neuropathy   . Erectile dysfunction   . Obesity     Past Surgical History  Procedure Date  . Coronary artery bypass graft 1996    eith a LIMA to the LAD, saphenous vein graft to the acute  marginal, saphenous vein graft to the PDA and saphenous vein graft to the circumflex.  . Knee arthroscopy 05/2004    right knee  . Replacement total knee bilateral   . Shoulder surgery     History   Social History  . Marital Status: Married    Spouse Name: N/A    Number of Children: N/A  . Years of Education: N/A   Occupational History  . Retired Naval architect    Social History Main Topics  . Smoking status: Never Smoker   . Smokeless tobacco: Not on file  . Alcohol Use: Yes  . Drug Use: Not on file  . Sexually Active: Not on file   Other Topics Concern  . Not on file   Social History Narrative   married    ROS: no fevers or chills, productive cough, hemoptysis, dysphasia, odynophagia, melena, hematochezia, dysuria, hematuria, rash, seizure activity, orthopnea, PND, pedal edema, claudication. Remaining systems are negative.  Physical Exam: Well-developed well-nourished in no acute distress.  Skin is warm and dry.  HEENT is normal.  Neck is supple. No thyromegaly.  Chest is clear to auscultation with normal expansion.  Cardiovascular exam is regular rate and rhythm.  Abdominal exam nontender or distended. No masses palpated. Extremities show no edema. neuro grossly intact  ECG normal sinus rhythm at a rate of 72. Nonspecific ST changes.

## 2011-03-28 NOTE — Assessment & Plan Note (Signed)
Continue statin. Lipids and liver monitored by primary care. 

## 2011-03-28 NOTE — Assessment & Plan Note (Signed)
Blood pressure controlled. Continue present medications. 

## 2011-03-28 NOTE — Patient Instructions (Addendum)
Your physician wants you to follow-up in: ONE YEAR You will receive a reminder letter in the mail two months in advance. If you don't receive a letter, please call our office to schedule the follow-up appointment.   STOP EFFIENT 

## 2011-05-04 ENCOUNTER — Other Ambulatory Visit: Payer: Medicare Other

## 2011-05-08 ENCOUNTER — Other Ambulatory Visit (INDEPENDENT_AMBULATORY_CARE_PROVIDER_SITE_OTHER): Payer: Medicare Other

## 2011-05-08 LAB — BASIC METABOLIC PANEL
BUN: 16 mg/dL (ref 6–23)
CO2: 25 mEq/L (ref 19–32)
Calcium: 9 mg/dL (ref 8.4–10.5)
Chloride: 107 mEq/L (ref 96–112)
Creatinine, Ser: 0.9 mg/dL (ref 0.4–1.5)
GFR: 87.25 mL/min (ref >60.00)
Glucose, Bld: 103 mg/dL — ABNORMAL HIGH (ref 70–99)
Potassium: 4.3 mEq/L (ref 3.5–5.1)
Sodium: 142 mEq/L (ref 135–145)

## 2011-05-08 LAB — HEMOGLOBIN A1C: Hgb A1c MFr Bld: 7.9 % — ABNORMAL HIGH (ref 4.6–6.5)

## 2011-05-11 ENCOUNTER — Ambulatory Visit (INDEPENDENT_AMBULATORY_CARE_PROVIDER_SITE_OTHER): Payer: Medicare Other | Admitting: Internal Medicine

## 2011-05-11 ENCOUNTER — Encounter: Payer: Self-pay | Admitting: Internal Medicine

## 2011-05-11 VITALS — BP 100/70 | HR 60 | Temp 98.1°F | Wt 249.0 lb

## 2011-05-11 DIAGNOSIS — E785 Hyperlipidemia, unspecified: Secondary | ICD-10-CM

## 2011-05-11 MED ORDER — ATORVASTATIN CALCIUM 80 MG PO TABS: 80.0000 mg | ORAL_TABLET | Freq: Every day | ORAL | Status: DC

## 2011-05-11 MED ORDER — INSULIN GLULISINE 100 UNIT/ML ~~LOC~~ SOLN: 10.0000 [IU] | Freq: Three times a day (TID) | SUBCUTANEOUS | Status: DC

## 2011-05-11 MED ORDER — LISINOPRIL-HYDROCHLOROTHIAZIDE 20-12.5 MG PO TABS: 1.0000 | ORAL_TABLET | Freq: Every day | ORAL | Status: DC

## 2011-05-11 MED ORDER — NEBIVOLOL HCL 2.5 MG PO TABS: 2.5000 mg | ORAL_TABLET | Freq: Every day | ORAL | Status: DC

## 2011-05-11 MED ORDER — METFORMIN HCL 1000 MG PO TABS: 1000.0000 mg | ORAL_TABLET | Freq: Two times a day (BID) | ORAL | Status: DC

## 2011-05-11 MED ORDER — OMEPRAZOLE 40 MG PO CPDR: 40.0000 mg | DELAYED_RELEASE_CAPSULE | Freq: Every day | ORAL | Status: DC

## 2011-05-11 MED ORDER — INSULIN GLARGINE 100 UNIT/ML ~~LOC~~ SOLN: SUBCUTANEOUS | Status: DC

## 2011-05-11 NOTE — Progress Notes (Signed)
Subjective:    Patient ID: Luis Yu, male    DOB: [birth year], 66 y.o.   MRN: [id number]  HPI  66 year old white male with history of coronary artery disease, uncontrolled diabetes and hypertension for followup. Patient continues to have labile blood sugars and intermittent episodes of hypoglycemia. His wife estimates that he has had at least 10 episodes of hypoglycemia since his previous visit. They usually occur in the afternoon. His meals are irregular as is his activity level.  He often forgets to eat lunch or eats a light lunch.  Review of Systems Dyspnea,  Negative for chest pain  Past Medical History  Diagnosis Date  . Depression     type II, uncontrolled  . Hypertension   . Hyperlipidemia   . GERD (gastroesophageal reflux disease)   . CAD (coronary artery disease)   . Osteoarthritis   . Postherpetic neuralgia   . Herpes zoster   . Adjustment disorder with mixed emotional features   . Diabetic peripheral neuropathy   . Erectile dysfunction   . Obesity     History   Social History  . Marital Status: Married    Spouse Name: N/A    Number of Children: N/A  . Years of Education: N/A   Occupational History  . Retired Naval architect    Social History Main Topics  . Smoking status: Never Smoker   . Smokeless tobacco: Not on file  . Alcohol Use: Yes  . Drug Use: Not on file  . Sexually Active: Not on file   Other Topics Concern  . Not on file   Social History Narrative   married    Past Surgical History  Procedure Date  . Coronary artery bypass graft 1996    eith a LIMA to the LAD, saphenous vein graft to the acute marginal, saphenous vein graft to the PDA and saphenous vein graft to the circumflex.  . Knee arthroscopy 05/2004    right knee  . Replacement total knee bilateral   . Shoulder surgery     Family History  Problem Relation Age of Onset  . Coronary artery disease Mother     deceased at 77  . Coronary artery disease Father     alive  .  Stroke      half brother  . Stroke Brother     No Known Allergies  Current Outpatient Prescriptions on File Prior to Visit  Medication Sig Dispense Refill  . aspirin 325 MG EC tablet Take 325 mg by mouth daily.        [name] buPROPion (WELLBUTRIN XL) 300 MG 24 hr tablet Take 1 tablet (300 mg total) by mouth daily.  90 tablet  3  . gabapentin (NEURONTIN) 300 MG capsule Take 1 capsule (300 mg total) by mouth at bedtime as needed. For neuropathy.  90 capsule  3  . glucose blood (ACCU-CHEK AVIVA) test strip Use to check blood sugar 5 times a day as instructed       . Insulin Pen Needle (PEN NEEDLES 31GX5/16") 31G X 8 MM MISC Use to inject insulin 5 times a day.  100 each  11  . Lancets (ACCU-CHEK MULTICLIX) lancets by Other route. Use up to 5 times a day to check blood sugar.       . polyethylene glycol (MIRALAX / GLYCOLAX) packet Take 17 g by mouth daily. 17 grams mixed with water two times a day.         Temperature 98.1, weight 249 pounds, blood  pressure 100/70, pulse ox 97%, pulse 60      Objective:   Physical Exam  Constitutional: He appears well-developed and well-nourished.  Cardiovascular: Normal rate, regular rhythm and normal heart sounds.   Pulmonary/Chest: Effort normal and breath sounds normal. He has no wheezes. He has no rales.  Musculoskeletal: He exhibits no edema.  Skin: Skin is warm and dry.  Psychiatric: He has a normal mood and affect. His behavior is normal.       Assessment & Plan:

## 2011-05-11 NOTE — Patient Instructions (Signed)
Plan your meals for the week as directed You must eat more carbohydrates with your afternoon meal.

## 2011-05-11 NOTE — Assessment & Plan Note (Addendum)
Patient having intermittent hypoglycemic episodes. This is mainly attributable to irregular eating habits.  Patient advised to start meal planning.  I stressed importance of eating snacks before significant physical activity.   If persistent hypoglycemia, consider decrease lantus dose.

## 2011-06-08 ENCOUNTER — Ambulatory Visit (INDEPENDENT_AMBULATORY_CARE_PROVIDER_SITE_OTHER): Payer: Medicare Other | Admitting: Internal Medicine

## 2011-06-08 ENCOUNTER — Encounter: Payer: Self-pay | Admitting: Internal Medicine

## 2011-06-08 VITALS — BP 162/90 | HR 79 | Temp 98.0°F | Wt 247.0 lb

## 2011-06-08 DIAGNOSIS — I10 Essential (primary) hypertension: Secondary | ICD-10-CM

## 2011-06-08 MED ORDER — NEBIVOLOL HCL 5 MG PO TABS: 5.0000 mg | ORAL_TABLET | Freq: Every day | ORAL | Status: DC

## 2011-06-08 NOTE — Assessment & Plan Note (Signed)
The patient having much less issues with hypoglycemia with meal planning. However hypoglycemic episodes have not been completely eliminated. Patient advised to discontinue a.m. dose of Lantus and only take Lantus at bedtime (20 units)

## 2011-06-08 NOTE — Assessment & Plan Note (Signed)
Blood pressure with manual cuff is 140/80. Increase bystolic to 5 mg once daily.

## 2011-06-08 NOTE — Progress Notes (Signed)
Subjective:    Patient ID: Luis Yu, male    DOB: 1945-06-05, 66 y.o.   MRN: [id number]  HPI  This 66 year old white male with uncontrolled type 2 diabetes, coronary artery disease and hypertension in for followup. Since previous visit patient has had less frequent episodes of hypoglycemia. His wife has tried to plan his meals.  He has had 3 minor hypoglycemic episodes which occur usually in the afternoon.  Htn - good medication compliance  Review of Systems Negative for chest pain.  Wife notes problems with short term memory  Past Medical History  Diagnosis Date  . Depression     type II, uncontrolled  . Hypertension   . Hyperlipidemia   . GERD (gastroesophageal reflux disease)   . CAD (coronary artery disease)   . Osteoarthritis   . Postherpetic neuralgia   . Herpes zoster   . Adjustment disorder with mixed emotional features   . Diabetic peripheral neuropathy   . Erectile dysfunction   . Obesity     History   Social History  . Marital Status: Married    Spouse Name: N/A    Number of Children: N/A  . Years of Education: N/A   Occupational History  . Retired Naval architect    Social History Main Topics  . Smoking status: Never Smoker   . Smokeless tobacco: Not on file  . Alcohol Use: Yes  . Drug Use: Not on file  . Sexually Active: Not on file   Other Topics Concern  . Not on file   Social History Narrative   married    Past Surgical History  Procedure Date  . Coronary artery bypass graft 1996    eith a LIMA to the LAD, saphenous vein graft to the acute marginal, saphenous vein graft to the PDA and saphenous vein graft to the circumflex.  . Knee arthroscopy 05/2004    right knee  . Replacement total knee bilateral   . Shoulder surgery     Family History  Problem Relation Age of Onset  . Coronary artery disease Mother     deceased at 48  . Coronary artery disease Father     alive  . Stroke      half brother  . Stroke Brother     No Known  Allergies  Current Outpatient Prescriptions on File Prior to Visit  Medication Sig Dispense Refill  . aspirin 325 MG EC tablet Take 325 mg by mouth daily.        [name] atorvastatin (LIPITOR) 80 MG tablet Take 1 tablet (80 mg total) by mouth daily.  90 tablet  1  . buPROPion (WELLBUTRIN XL) 300 MG 24 hr tablet Take 1 tablet (300 mg total) by mouth daily.  90 tablet  3  . gabapentin (NEURONTIN) 300 MG capsule Take 1 capsule (300 mg total) by mouth at bedtime as needed. For neuropathy.  90 capsule  3  . glucose blood (ACCU-CHEK AVIVA) test strip Use to check blood sugar 5 times a day as instructed       . insulin glulisine (APIDRA SOLOSTAR) 100 UNIT/ML injection Inject 10-20 Units into the skin 3 (three) times daily before meals.  45 mL  3  . Insulin Pen Needle (PEN NEEDLES 31GX5/16") 31G X 8 MM MISC Use to inject insulin 5 times a day.  100 each  11  . Lancets (ACCU-CHEK MULTICLIX) lancets by Other route. Use up to 5 times a day to check blood sugar.       [name]  lisinopril-hydrochlorothiazide (PRINZIDE,ZESTORETIC) 20-12.5 MG per tablet Take 1 tablet by mouth daily.  90 tablet  1  . metFORMIN (GLUCOPHAGE) 1000 MG tablet Take 1 tablet (1,000 mg total) by mouth 2 (two) times daily with a meal.  180 tablet  1  . omeprazole (PRILOSEC) 40 MG capsule Take 1 capsule (40 mg total) by mouth daily.  90 capsule  1  . polyethylene glycol (MIRALAX / GLYCOLAX) packet Take 17 g by mouth daily. 17 grams mixed with water two times a day.         BP 162/90  Pulse 79  Temp(Src) 98 F (36.7 C) (Oral)  Wt 247 lb (112.038 kg)       Objective:   Physical Exam  Constitutional: He appears well-developed and well-nourished.  Cardiovascular: Normal rate, regular rhythm and normal heart sounds.   Pulmonary/Chest: Effort normal and breath sounds normal.  Neurological: He is alert.  Psychiatric: He has a normal mood and affect. His behavior is normal.          Assessment & Plan:

## 2011-06-08 NOTE — Patient Instructions (Signed)
Please complete the following lab tests before your next follow up appointment:  BMET, A1c - 250.02 

## 2011-06-27 DIAGNOSIS — H6120 Impacted cerumen, unspecified ear: Secondary | ICD-10-CM | POA: Diagnosis not present

## 2011-07-25 ENCOUNTER — Ambulatory Visit (INDEPENDENT_AMBULATORY_CARE_PROVIDER_SITE_OTHER): Payer: Medicare Other | Admitting: Internal Medicine

## 2011-07-25 ENCOUNTER — Encounter: Payer: Self-pay | Admitting: Internal Medicine

## 2011-07-25 VITALS — BP 130/80 | HR 72 | Temp 98.0°F | Ht 74.0 in | Wt 247.0 lb

## 2011-07-25 DIAGNOSIS — I10 Essential (primary) hypertension: Secondary | ICD-10-CM

## 2011-07-25 DIAGNOSIS — Z23 Encounter for immunization: Secondary | ICD-10-CM | POA: Diagnosis not present

## 2011-07-25 DIAGNOSIS — IMO0001 Reserved for inherently not codable concepts without codable children: Secondary | ICD-10-CM | POA: Diagnosis not present

## 2011-07-25 MED ORDER — INSULIN GLULISINE 100 UNIT/ML ~~LOC~~ SOLN: 15.0000 [IU] | Freq: Three times a day (TID) | SUBCUTANEOUS | Status: DC

## 2011-07-25 NOTE — Assessment & Plan Note (Signed)
Improved.  Hypoglycemia resolved.  Pt advised to titrate apidra to 15-30 units before meals.  We discussed using less insulin for afternoon meals if he plans to work around the house.

## 2011-07-25 NOTE — Progress Notes (Signed)
Addended by: Alfred Levins D on: 07/25/2011 04:34 PM   Modules accepted: Orders

## 2011-07-25 NOTE — Patient Instructions (Signed)
Please complete the following lab tests before your next follow up appointment: BMET - 401.9 A1c - 250.02 

## 2011-07-25 NOTE — Assessment & Plan Note (Signed)
Improved with higher dose of bystolic.  No change in medication regimen. BP: 130/80 mmHg

## 2011-07-25 NOTE — Progress Notes (Signed)
Subjective:    Patient ID: Luis Yu, male    DOB: 1945-12-03, 66 y.o.   MRN: [id number]  HPI  66 year old white male with uncontrolled type 2 diabetes for followup. At previous visit patient was advised to discontinue his morning dose of Lantus. He is having much less hypoglycemia. His postprandial blood sugars are usually greater than 200. He still has some difficulty remembering to take his mealtime insulin on a consistent basis.  Hypertension - tolerating higher dose of bystolic.  He denies dizziness or lightheadedness.  Review of Systems No chest pain  Past Medical History  Diagnosis Date  . Depression     type II, uncontrolled  . Hypertension   . Hyperlipidemia   . GERD (gastroesophageal reflux disease)   . CAD (coronary artery disease)   . Osteoarthritis   . Postherpetic neuralgia   . Herpes zoster   . Adjustment disorder with mixed emotional features   . Diabetic peripheral neuropathy   . Erectile dysfunction   . Obesity     History   Social History  . Marital Status: Married    Spouse Name: N/A    Number of Children: N/A  . Years of Education: N/A   Occupational History  . Retired Naval architect    Social History Main Topics  . Smoking status: Never Smoker   . Smokeless tobacco: Not on file  . Alcohol Use: Yes  . Drug Use: Not on file  . Sexually Active: Not on file   Other Topics Concern  . Not on file   Social History Narrative   married    Past Surgical History  Procedure Date  . Coronary artery bypass graft 1996    eith a LIMA to the LAD, saphenous vein graft to the acute marginal, saphenous vein graft to the PDA and saphenous vein graft to the circumflex.  . Knee arthroscopy 05/2004    right knee  . Replacement total knee bilateral   . Shoulder surgery     Family History  Problem Relation Age of Onset  . Coronary artery disease Mother     deceased at 5  . Coronary artery disease Father     alive  . Stroke      half brother  .  Stroke Brother     No Known Allergies  Current Outpatient Prescriptions on File Prior to Visit  Medication Sig Dispense Refill  . aspirin 325 MG EC tablet Take 325 mg by mouth daily.        [name] atorvastatin (LIPITOR) 80 MG tablet Take 1 tablet (80 mg total) by mouth daily.  90 tablet  1  . buPROPion (WELLBUTRIN XL) 300 MG 24 hr tablet Take 1 tablet (300 mg total) by mouth daily.  90 tablet  3  . gabapentin (NEURONTIN) 300 MG capsule Take 1 capsule (300 mg total) by mouth at bedtime as needed. For neuropathy.  90 capsule  3  . glucose blood (ACCU-CHEK AVIVA) test strip Use to check blood sugar 5 times a day as instructed       . insulin glargine (LANTUS SOLOSTAR) 100 UNIT/ML injection Inject 20 units in the evening subcutaneously.  45 mL  3  . Insulin Pen Needle (PEN NEEDLES 31GX5/16") 31G X 8 MM MISC Use to inject insulin 5 times a day.  100 each  11  . Lancets (ACCU-CHEK MULTICLIX) lancets by Other route. Use up to 5 times a day to check blood sugar.       [name]  lisinopril-hydrochlorothiazide (PRINZIDE,ZESTORETIC) 20-12.5 MG per tablet Take 1 tablet by mouth daily.  90 tablet  1  . metFORMIN (GLUCOPHAGE) 1000 MG tablet Take 1 tablet (1,000 mg total) by mouth 2 (two) times daily with a meal.  180 tablet  1  . nebivolol (BYSTOLIC) 5 MG tablet Take 1 tablet (5 mg total) by mouth daily.  90 tablet  1  . omeprazole (PRILOSEC) 40 MG capsule Take 1 capsule (40 mg total) by mouth daily.  90 capsule  1  . polyethylene glycol (MIRALAX / GLYCOLAX) packet Take 17 g by mouth daily. 17 grams mixed with water two times a day.       [name] DISCONTD: insulin glulisine (APIDRA SOLOSTAR) 100 UNIT/ML injection Inject 10-20 Units into the skin 3 (three) times daily before meals.  45 mL  3    BP 130/80  Pulse 72  Temp(Src) 98 F (36.7 C) (Oral)  Ht 6\' 2"  (1.88 m)  Wt 247 lb (112.038 kg)  BMI 31.71 kg/m2       Objective:   Physical Exam  Constitutional: He is oriented to person, place, and time. He appears  well-developed and well-nourished.  Cardiovascular: Normal rate, regular rhythm and normal heart sounds.   Pulmonary/Chest: Effort normal and breath sounds normal. He has no wheezes. He has no rales.  Musculoskeletal: He exhibits no edema.  Neurological: He is alert and oriented to person, place, and time.  Skin: Skin is warm and dry.  Psychiatric: He has a normal mood and affect. His behavior is normal.       Assessment & Plan:

## 2011-08-17 ENCOUNTER — Ambulatory Visit: Payer: Medicare Other | Admitting: Internal Medicine

## 2011-08-28 ENCOUNTER — Other Ambulatory Visit: Payer: Self-pay | Admitting: Gastroenterology

## 2011-08-28 DIAGNOSIS — IMO0001 Reserved for inherently not codable concepts without codable children: Secondary | ICD-10-CM

## 2011-08-28 DIAGNOSIS — I10 Essential (primary) hypertension: Secondary | ICD-10-CM

## 2011-08-29 ENCOUNTER — Other Ambulatory Visit: Payer: Medicare Other

## 2011-08-29 ENCOUNTER — Other Ambulatory Visit (INDEPENDENT_AMBULATORY_CARE_PROVIDER_SITE_OTHER): Payer: Medicare Other

## 2011-08-29 DIAGNOSIS — I10 Essential (primary) hypertension: Secondary | ICD-10-CM | POA: Diagnosis not present

## 2011-08-29 LAB — HEMOGLOBIN A1C: Hgb A1c MFr Bld: 8.2 % — ABNORMAL HIGH (ref 4.6–6.5)

## 2011-08-29 LAB — BASIC METABOLIC PANEL
BUN: 18 mg/dL (ref 6–23)
CO2: 28 mEq/L (ref 19–32)
Calcium: 9 mg/dL (ref 8.4–10.5)
Chloride: 102 mEq/L (ref 96–112)
Creatinine, Ser: 1 mg/dL (ref 0.4–1.5)
GFR: 77.38 mL/min (ref >60.00)
Glucose, Bld: 154 mg/dL — ABNORMAL HIGH (ref 70–99)
Potassium: 3.8 mEq/L (ref 3.5–5.1)
Sodium: 139 mEq/L (ref 135–145)

## 2011-09-05 ENCOUNTER — Encounter: Payer: Self-pay | Admitting: Internal Medicine

## 2011-09-05 ENCOUNTER — Ambulatory Visit (INDEPENDENT_AMBULATORY_CARE_PROVIDER_SITE_OTHER): Payer: Medicare Other | Admitting: Internal Medicine

## 2011-09-05 VITALS — BP 110/68 | HR 76 | Temp 97.7°F | Ht 74.0 in | Wt 246.0 lb

## 2011-09-05 DIAGNOSIS — H6120 Impacted cerumen, unspecified ear: Secondary | ICD-10-CM | POA: Diagnosis not present

## 2011-09-05 NOTE — Progress Notes (Signed)
Subjective:    Patient ID: Luis Yu, male    DOB: [birth year], 66 y.o.   MRN: [id number]  HPI  66 year old white male with type 2 diabetes uncontrolled, coronary artery disease and hypertension for routine followup. Patient still struggling with occasional episodes of hypoglycemia. This usually occurs when he is visiting a friend and forgetting to eat his afternoon meal. His A1c is unchanged at 8.2.  Wife reports patient is more irritable than usual when his blood sugars are low.  Patient also wears bilateral hearing aid and complaints of cerumen impaction.  Review of Systems Negative for chest pain, hearing loss  Past Medical History  Diagnosis Date  . Depression     type II, uncontrolled  . Hypertension   . Hyperlipidemia   . GERD (gastroesophageal reflux disease)   . CAD (coronary artery disease)   . Osteoarthritis   . Postherpetic neuralgia   . Herpes zoster   . Adjustment disorder with mixed emotional features   . Diabetic peripheral neuropathy   . Erectile dysfunction   . Obesity     History   Social History  . Marital Status: Married    Spouse Name: N/A    Number of Children: N/A  . Years of Education: N/A   Occupational History  . Retired Naval architect    Social History Main Topics  . Smoking status: Never Smoker   . Smokeless tobacco: Not on file  . Alcohol Use: Yes  . Drug Use: Not on file  . Sexually Active: Not on file   Other Topics Concern  . Not on file   Social History Narrative   married    Past Surgical History  Procedure Date  . Coronary artery bypass graft 1996    eith a LIMA to the LAD, saphenous vein graft to the acute marginal, saphenous vein graft to the PDA and saphenous vein graft to the circumflex.  . Knee arthroscopy 05/2004    right knee  . Replacement total knee bilateral   . Shoulder surgery     Family History  Problem Relation Age of Onset  . Coronary artery disease Mother     deceased at 57  . Coronary artery  disease Father     alive  . Stroke      half brother  . Stroke Brother     No Known Allergies  Current Outpatient Prescriptions on File Prior to Visit  Medication Sig Dispense Refill  . aspirin 325 MG EC tablet Take 325 mg by mouth daily.        [name] atorvastatin (LIPITOR) 80 MG tablet Take 1 tablet (80 mg total) by mouth daily.  90 tablet  1  . buPROPion (WELLBUTRIN XL) 300 MG 24 hr tablet Take 1 tablet (300 mg total) by mouth daily.  90 tablet  3  . gabapentin (NEURONTIN) 300 MG capsule Take 1 capsule (300 mg total) by mouth at bedtime as needed. For neuropathy.  90 capsule  3  . glucose blood (ACCU-CHEK AVIVA) test strip Use to check blood sugar 5 times a day as instructed       . insulin glargine (LANTUS SOLOSTAR) 100 UNIT/ML injection Inject 20 units in the evening subcutaneously.  45 mL  3  . insulin glulisine (APIDRA SOLOSTAR) 100 UNIT/ML injection Inject 15-30 Units into the skin 3 (three) times daily before meals.  45 mL  3  . Insulin Pen Needle (PEN NEEDLES 31GX5/16") 31G X 8 MM MISC Use to inject insulin 5  times a day.  100 each  11  . Lancets (ACCU-CHEK MULTICLIX) lancets by Other route. Use up to 5 times a day to check blood sugar.       . lisinopril-hydrochlorothiazide (PRINZIDE,ZESTORETIC) 20-12.5 MG per tablet Take 1 tablet by mouth daily.  90 tablet  1  . metFORMIN (GLUCOPHAGE) 1000 MG tablet Take 1 tablet (1,000 mg total) by mouth 2 (two) times daily with a meal.  180 tablet  1  . nebivolol (BYSTOLIC) 5 MG tablet Take 1 tablet (5 mg total) by mouth daily.  90 tablet  1  . omeprazole (PRILOSEC) 40 MG capsule Take 1 capsule (40 mg total) by mouth daily.  90 capsule  1  . polyethylene glycol (MIRALAX / GLYCOLAX) packet Take 17 g by mouth daily. 17 grams mixed with water two times a day.         BP 110/68  Pulse 76  Temp 97.7 F (36.5 C) (Oral)  Ht 6\' 2"  (1.88 m)  Wt 246 lb (111.585 kg)  BMI 31.58 kg/m2       Objective:   Physical Exam  Constitutional: He is  oriented to person, place, and time. He appears well-developed and well-nourished.  HENT:  Head: Normocephalic and atraumatic.       Bilateral cerumen impaction  Cardiovascular: Normal rate, regular rhythm and normal heart sounds.   No murmur heard. Pulmonary/Chest: Effort normal. He has no wheezes.  Neurological: He is alert and oriented to person, place, and time.  Skin: Skin is warm and dry.  Psychiatric: He has a normal mood and affect. His behavior is normal.          Assessment & Plan:

## 2011-09-05 NOTE — Patient Instructions (Addendum)
You are due for your annual eye exam.  Please follow up with your opthalmologist. Please complete the following lab tests before your next follow up appointment: BMET, A1c - 250.02 Lipid panel, LFTs - 272.4

## 2011-09-05 NOTE — Assessment & Plan Note (Signed)
Both ear canals were irrigated to remove cerumen plug. No complications.

## 2011-09-05 NOTE — Assessment & Plan Note (Signed)
Patient still struggling with adequate blood sugar control. He has difficulty planning meals due to impulsive behavior. He often forgets to eat his afternoon meal. Patient also having difficulty remembering to take his mealtime insulin with dinner. Blood sugar control limited by patient compliance.  I encouraged his wife to help patient with meal time insulin as much as possible.  Lab Results  Component Value Date   HGBA1C 8.2* 08/29/2011   Lab Results  Component Value Date   CREATININE 1.0 08/29/2011

## 2011-10-10 ENCOUNTER — Telehealth: Payer: Self-pay | Admitting: Pulmonary Disease

## 2011-10-10 NOTE — Telephone Encounter (Signed)
Spoke to pt 10/10/11 to tell him it is time to make his next ov with RA.  He did not wish to make this appt @ this time.  He will call us @ a later date to do this

## 2011-10-11 DIAGNOSIS — E11319 Type 2 diabetes mellitus with unspecified diabetic retinopathy without macular edema: Secondary | ICD-10-CM | POA: Diagnosis not present

## 2011-10-11 DIAGNOSIS — H2510 Age-related nuclear cataract, unspecified eye: Secondary | ICD-10-CM | POA: Diagnosis not present

## 2011-10-11 DIAGNOSIS — Z961 Presence of intraocular lens: Secondary | ICD-10-CM | POA: Diagnosis not present

## 2011-10-11 DIAGNOSIS — E119 Type 2 diabetes mellitus without complications: Secondary | ICD-10-CM | POA: Diagnosis not present

## 2011-10-31 ENCOUNTER — Telehealth: Payer: Self-pay | Admitting: Internal Medicine

## 2011-10-31 NOTE — Telephone Encounter (Signed)
Opened in error

## 2011-11-05 DIAGNOSIS — H2510 Age-related nuclear cataract, unspecified eye: Secondary | ICD-10-CM | POA: Diagnosis not present

## 2011-11-12 DIAGNOSIS — H2510 Age-related nuclear cataract, unspecified eye: Secondary | ICD-10-CM | POA: Diagnosis not present

## 2011-11-12 DIAGNOSIS — H269 Unspecified cataract: Secondary | ICD-10-CM | POA: Diagnosis not present

## 2011-11-12 DIAGNOSIS — H21569 Pupillary abnormality, unspecified eye: Secondary | ICD-10-CM | POA: Diagnosis not present

## 2011-11-28 ENCOUNTER — Other Ambulatory Visit (INDEPENDENT_AMBULATORY_CARE_PROVIDER_SITE_OTHER): Payer: Medicare Other

## 2011-11-28 DIAGNOSIS — E785 Hyperlipidemia, unspecified: Secondary | ICD-10-CM | POA: Diagnosis not present

## 2011-11-28 DIAGNOSIS — E119 Type 2 diabetes mellitus without complications: Secondary | ICD-10-CM | POA: Diagnosis not present

## 2011-11-28 LAB — BASIC METABOLIC PANEL
BUN: 20 mg/dL (ref 6–23)
CO2: 29 mEq/L (ref 19–32)
Calcium: 9.1 mg/dL (ref 8.4–10.5)
Chloride: 105 mEq/L (ref 96–112)
Creatinine, Ser: 1.1 mg/dL (ref 0.4–1.5)
GFR: 73.17 mL/min (ref >60.00)
Glucose, Bld: 143 mg/dL — ABNORMAL HIGH (ref 70–99)
Potassium: 4.3 mEq/L (ref 3.5–5.1)
Sodium: 141 mEq/L (ref 135–145)

## 2011-11-28 LAB — HEPATIC FUNCTION PANEL
ALT: 17 U/L (ref 0–53)
AST: 21 U/L (ref 0–37)
Albumin: 3.8 g/dL (ref 3.5–5.2)
Alkaline Phosphatase: 74 U/L (ref 39–117)
Bilirubin, Direct: 0.1 mg/dL (ref 0.0–0.3)
Total Bilirubin: 0.6 mg/dL (ref 0.3–1.2)
Total Protein: 6.4 g/dL (ref 6.0–8.3)

## 2011-11-28 LAB — HEMOGLOBIN A1C: Hgb A1c MFr Bld: 7.5 % — ABNORMAL HIGH (ref 4.6–6.5)

## 2011-11-28 LAB — LIPID PANEL
Cholesterol: 136 mg/dL (ref 0–200)
HDL: 51.8 mg/dL (ref >39.00)
LDL Cholesterol: 71 mg/dL (ref 0–99)
Total CHOL/HDL Ratio: 3
Triglycerides: 65 mg/dL (ref 0.0–149.0)
VLDL: 13 mg/dL (ref 0.0–40.0)

## 2011-12-05 ENCOUNTER — Encounter: Payer: Self-pay | Admitting: Internal Medicine

## 2011-12-05 ENCOUNTER — Ambulatory Visit (INDEPENDENT_AMBULATORY_CARE_PROVIDER_SITE_OTHER): Payer: Medicare Other | Admitting: Internal Medicine

## 2011-12-05 VITALS — BP 122/78 | HR 76 | Temp 97.6°F | Wt 246.0 lb

## 2011-12-05 DIAGNOSIS — E785 Hyperlipidemia, unspecified: Secondary | ICD-10-CM

## 2011-12-05 DIAGNOSIS — I10 Essential (primary) hypertension: Secondary | ICD-10-CM

## 2011-12-05 DIAGNOSIS — IMO0001 Reserved for inherently not codable concepts without codable children: Secondary | ICD-10-CM

## 2011-12-05 DIAGNOSIS — R413 Other amnesia: Secondary | ICD-10-CM

## 2011-12-05 LAB — VITAMIN B12: Vitamin B-12: 328 pg/mL (ref 211–911)

## 2011-12-05 LAB — T4, FREE: Free T4: 0.89 ng/dL (ref 0.60–1.60)

## 2011-12-05 LAB — TSH: TSH: 1.62 u[IU]/mL (ref 0.35–5.50)

## 2011-12-05 MED ORDER — OMEPRAZOLE 40 MG PO CPDR: 40.0000 mg | DELAYED_RELEASE_CAPSULE | Freq: Every day | ORAL | Status: DC

## 2011-12-05 MED ORDER — INSULIN GLARGINE 100 UNIT/ML ~~LOC~~ SOLN: SUBCUTANEOUS | Status: DC

## 2011-12-05 MED ORDER — INSULIN GLULISINE 100 UNIT/ML ~~LOC~~ SOLN: 15.0000 [IU] | Freq: Three times a day (TID) | SUBCUTANEOUS | Status: DC

## 2011-12-05 MED ORDER — LISINOPRIL-HYDROCHLOROTHIAZIDE 20-12.5 MG PO TABS: 1.0000 | ORAL_TABLET | Freq: Every day | ORAL | Status: DC

## 2011-12-05 MED ORDER — ATORVASTATIN CALCIUM 80 MG PO TABS: 80.0000 mg | ORAL_TABLET | Freq: Every day | ORAL | Status: DC

## 2011-12-05 MED ORDER — NEBIVOLOL HCL 5 MG PO TABS: 5.0000 mg | ORAL_TABLET | Freq: Every day | ORAL | Status: DC

## 2011-12-05 MED ORDER — BUPROPION HCL ER (XL) 300 MG PO TB24: 300.0000 mg | ORAL_TABLET | Freq: Every day | ORAL | Status: DC

## 2011-12-05 MED ORDER — METFORMIN HCL 1000 MG PO TABS: 1000.0000 mg | ORAL_TABLET | Freq: Two times a day (BID) | ORAL | Status: DC

## 2011-12-05 NOTE — Assessment & Plan Note (Signed)
A1c improved but at expense of hypoglycemic episodes.  Decrease lantus dose to 15 units per day.  Patient advised to adjust his Apidra dose based upon carbohydrate intake.

## 2011-12-05 NOTE — Progress Notes (Signed)
Subjective:    Patient ID: Luis Yu, male    DOB: Sep 08, 1945, 66 y.o.   MRN: [id number]  HPI  66 y/o white male for f/u re: DM II.  A1c has improved.  However, his wife reports he has been experiencing frequent low blood sugars.  He often forgets to eat afternoon meal.  Also, he is often using 30 units of Apidra even with low carb meal.  He is not adjusting his insulin dose based upon carbohydrate intake.  Wife also complains patient's short term memory is getting worse.  She has noticed it more and more over last 2 yrs.  His friends also have noticed a change.  He still manages his own finances.  Wife thinks he may ADD.  Review of Systems Negative for chest pain,  Periodic diaphoresis ( wife attributes to low blood sugars)  Past Medical History  Diagnosis Date  . Depression     type II, uncontrolled  . Hypertension   . Hyperlipidemia   . GERD (gastroesophageal reflux disease)   . CAD (coronary artery disease)   . Osteoarthritis   . Postherpetic neuralgia   . Herpes zoster   . Adjustment disorder with mixed emotional features   . Diabetic peripheral neuropathy   . Erectile dysfunction   . Obesity     History   Social History  . Marital Status: Married    Spouse Name: N/A    Number of Children: N/A  . Years of Education: N/A   Occupational History  . Retired Naval architect    Social History Main Topics  . Smoking status: Never Smoker   . Smokeless tobacco: Not on file  . Alcohol Use: Yes  . Drug Use: Not on file  . Sexually Active: Not on file   Other Topics Concern  . Not on file   Social History Narrative   married    Past Surgical History  Procedure Date  . Coronary artery bypass graft 1996    eith a LIMA to the LAD, saphenous vein graft to the acute marginal, saphenous vein graft to the PDA and saphenous vein graft to the circumflex.  . Knee arthroscopy 05/2004    right knee  . Replacement total knee bilateral   . Shoulder surgery     Family  History  Problem Relation Age of Onset  . Coronary artery disease Mother     deceased at 106  . Coronary artery disease Father     alive  . Stroke      half brother  . Stroke Brother     No Known Allergies  Current Outpatient Prescriptions on File Prior to Visit  Medication Sig Dispense Refill  . aspirin 325 MG EC tablet Take 325 mg by mouth daily.        [name] gabapentin (NEURONTIN) 300 MG capsule Take 1 capsule (300 mg total) by mouth at bedtime as needed. For neuropathy.  90 capsule  3  . glucose blood (ACCU-CHEK AVIVA) test strip Use to check blood sugar 5 times a day as instructed       . Insulin Pen Needle (PEN NEEDLES 31GX5/16") 31G X 8 MM MISC Use to inject insulin 5 times a day.  100 each  11  . Lancets (ACCU-CHEK MULTICLIX) lancets by Other route. Use up to 5 times a day to check blood sugar.       . polyethylene glycol (MIRALAX / GLYCOLAX) packet Take 17 g by mouth daily. 17 grams mixed with water  two times a day.       [name] DISCONTD: atorvastatin (LIPITOR) 80 MG tablet Take 1 tablet (80 mg total) by mouth daily.  90 tablet  1  . DISCONTD: buPROPion (WELLBUTRIN XL) 300 MG 24 hr tablet Take 1 tablet (300 mg total) by mouth daily.  90 tablet  3  . DISCONTD: insulin glargine (LANTUS SOLOSTAR) 100 UNIT/ML injection Inject 20 units in the evening subcutaneously.  45 mL  3  . DISCONTD: insulin glulisine (APIDRA SOLOSTAR) 100 UNIT/ML injection Inject 15-30 Units into the skin 3 (three) times daily before meals.  45 mL  3  . DISCONTD: lisinopril-hydrochlorothiazide (PRINZIDE,ZESTORETIC) 20-12.5 MG per tablet Take 1 tablet by mouth daily.  90 tablet  1  . DISCONTD: metFORMIN (GLUCOPHAGE) 1000 MG tablet Take 1 tablet (1,000 mg total) by mouth 2 (two) times daily with a meal.  180 tablet  1  . DISCONTD: nebivolol (BYSTOLIC) 5 MG tablet Take 1 tablet (5 mg total) by mouth daily.  90 tablet  1  . DISCONTD: omeprazole (PRILOSEC) 40 MG capsule Take 1 capsule (40 mg total) by mouth daily.  90 capsule   1    BP 122/78  Pulse 76  Temp 97.6 F (36.4 C) (Oral)  Wt 246 lb (111.585 kg)       Objective:   Physical Exam  Constitutional: He is oriented to person, place, and time. He appears well-developed and well-nourished.  HENT:  Head: Normocephalic and atraumatic.  Cardiovascular: Normal rate, regular rhythm and normal heart sounds.   No murmur heard. Pulmonary/Chest: Effort normal and breath sounds normal. He has no wheezes.  Musculoskeletal: He exhibits no edema.  Neurological: He is alert and oriented to person, place, and time. No cranial nerve deficit.  Skin: Skin is warm and dry.  Psychiatric: He has a normal mood and affect. His behavior is normal.          Assessment & Plan:

## 2011-12-05 NOTE — Assessment & Plan Note (Signed)
Wife has noticed decline in ST memory loss especially of last 2 yrs.  Check TSH, B12 and RPR.  I suspect ADD may be playing a role but he is certainly not a candidate for stimulants.  Refer for neuropsych testing.  Untreated OSA and hypoglycemic episodes may also be playing a role.

## 2011-12-05 NOTE — Assessment & Plan Note (Signed)
Well controlled.  no change in BP medication regimen.  BP: 122/78 mmHg  Lab Results  Component Value Date   CREATININE 1.1 11/28/2011

## 2011-12-06 LAB — RPR

## 2011-12-07 ENCOUNTER — Other Ambulatory Visit: Payer: Self-pay | Admitting: *Deleted

## 2011-12-07 MED ORDER — "PEN NEEDLES 5/16"" 31G X 8 MM MISC": Status: DC

## 2011-12-10 ENCOUNTER — Ambulatory Visit: Payer: Medicare Other | Admitting: Internal Medicine

## 2011-12-18 DIAGNOSIS — S025XXA Fracture of tooth (traumatic), initial encounter for closed fracture: Secondary | ICD-10-CM | POA: Diagnosis not present

## 2011-12-18 DIAGNOSIS — Y92009 Unspecified place in unspecified non-institutional (private) residence as the place of occurrence of the external cause: Secondary | ICD-10-CM | POA: Diagnosis not present

## 2011-12-18 DIAGNOSIS — K1370 Unspecified lesions of oral mucosa: Secondary | ICD-10-CM | POA: Diagnosis not present

## 2012-01-02 DIAGNOSIS — E11319 Type 2 diabetes mellitus with unspecified diabetic retinopathy without macular edema: Secondary | ICD-10-CM | POA: Diagnosis not present

## 2012-01-11 ENCOUNTER — Telehealth: Payer: Self-pay | Admitting: Internal Medicine

## 2012-01-11 NOTE — Telephone Encounter (Signed)
Pt needs new rx for DM shoes. Pt is aware MD out of office today

## 2012-01-13 ENCOUNTER — Other Ambulatory Visit: Payer: Self-pay | Admitting: Internal Medicine

## 2012-01-14 NOTE — Telephone Encounter (Signed)
Per pt please mail, mailed to pt

## 2012-01-14 NOTE — Telephone Encounter (Signed)
rx written for pt pick up

## 2012-01-25 ENCOUNTER — Telehealth: Payer: Self-pay | Admitting: Internal Medicine

## 2012-01-25 NOTE — Telephone Encounter (Signed)
Has been faxed back

## 2012-01-25 NOTE — Telephone Encounter (Signed)
[facility] called to check on status of req for Diabetic Testing supply refills.

## 2012-02-04 ENCOUNTER — Ambulatory Visit: Payer: Medicare Other | Admitting: Internal Medicine

## 2012-02-06 ENCOUNTER — Ambulatory Visit: Payer: Medicare Other | Admitting: Internal Medicine

## 2012-02-08 ENCOUNTER — Ambulatory Visit: Payer: Medicare Other | Admitting: Internal Medicine

## 2012-02-13 ENCOUNTER — Telehealth: Payer: Self-pay | Admitting: Internal Medicine

## 2012-02-13 MED ORDER — METFORMIN HCL 1000 MG PO TABS: 1000.0000 mg | ORAL_TABLET | Freq: Two times a day (BID) | ORAL | Status: DC

## 2012-02-13 NOTE — Telephone Encounter (Signed)
rx sent in electronically 

## 2012-02-13 NOTE — Telephone Encounter (Signed)
Refill- metformin hcl 1000mg  tablet. Take one tablet by mouth twice a day with a meal. Qty 180 last fill 4.16.13

## 2012-03-05 ENCOUNTER — Ambulatory Visit: Payer: Medicare Other | Admitting: Internal Medicine

## 2012-03-18 ENCOUNTER — Ambulatory Visit: Payer: Medicare Other | Admitting: Internal Medicine

## 2012-03-18 ENCOUNTER — Ambulatory Visit (INDEPENDENT_AMBULATORY_CARE_PROVIDER_SITE_OTHER): Payer: Medicare Other | Admitting: Internal Medicine

## 2012-03-18 ENCOUNTER — Encounter: Payer: Self-pay | Admitting: Internal Medicine

## 2012-03-18 VITALS — BP 128/80 | Temp 97.5°F | Wt 248.0 lb

## 2012-03-18 DIAGNOSIS — H6120 Impacted cerumen, unspecified ear: Secondary | ICD-10-CM

## 2012-03-18 DIAGNOSIS — I10 Essential (primary) hypertension: Secondary | ICD-10-CM

## 2012-03-18 MED ORDER — "PEN NEEDLES 5/16"" 31G X 8 MM MISC": Status: DC

## 2012-03-18 NOTE — Assessment & Plan Note (Signed)
Patient experiencing less hypoglycemia would reduced dose of Lantus.  Continue same insulin regimen.  Monitor A1c before next OV.

## 2012-03-18 NOTE — Progress Notes (Signed)
Subjective:    Patient ID: Luis Yu, male    DOB: [birth year], 66 y.o.   MRN: [id number]  HPI  66 year old white male with uncontrolled type 2 diabetes, hypertension and short-term memory loss for followup. Patient reports less hypoglycemic episode since decreasing dose of Lantus. [place] He forgot his glucometer has not been checking his blood sugar. He does report using his mealtime insulin with regularity.  He wears hearing aids. He complains of ear wax in right ear.   Review of Systems Negative for hypoglycemia  Past Medical History  Diagnosis Date  . Depression     type II, uncontrolled  . Hypertension   . Hyperlipidemia   . GERD (gastroesophageal reflux disease)   . CAD (coronary artery disease)   . Osteoarthritis   . Postherpetic neuralgia   . Herpes zoster   . Adjustment disorder with mixed emotional features   . Diabetic peripheral neuropathy   . Erectile dysfunction   . Obesity     History   Social History  . Marital Status: Married    Spouse Name: N/A    Number of Children: N/A  . Years of Education: N/A   Occupational History  . Retired Naval architect    Social History Main Topics  . Smoking status: Never Smoker   . Smokeless tobacco: Not on file  . Alcohol Use: Yes  . Drug Use: Not on file  . Sexually Active: Not on file   Other Topics Concern  . Not on file   Social History Narrative   married    Past Surgical History  Procedure Date  . Coronary artery bypass graft 1996    eith a LIMA to the LAD, saphenous vein graft to the acute marginal, saphenous vein graft to the PDA and saphenous vein graft to the circumflex.  . Knee arthroscopy 05/2004    right knee  . Replacement total knee bilateral   . Shoulder surgery     Family History  Problem Relation Age of Onset  . Coronary artery disease Mother     deceased at 38  . Coronary artery disease Father     alive  . Stroke      half  brother  . Stroke Brother     No Known Allergies  Current Outpatient Prescriptions on File Prior to Visit  Medication Sig Dispense Refill  . aspirin 325 MG EC tablet Take 325 mg by mouth daily.        [name] atorvastatin (LIPITOR) 80 MG tablet Take 1 tablet (80 mg total) by mouth daily.  90 tablet  1  . buPROPion (WELLBUTRIN XL) 300 MG 24 hr tablet TAKE 1 TABLET BY MOUTH EVERY DAY  90 tablet  1  . gabapentin (NEURONTIN) 300 MG capsule Take 1 capsule (300 mg total) by mouth at bedtime as needed. For neuropathy.  90 capsule  3  . glucose blood (ACCU-CHEK AVIVA) test strip Use to check blood sugar 5 times a day as instructed       . insulin glargine (LANTUS SOLOSTAR) 100 UNIT/ML injection Inject 15 units in the evening subcutaneously.  45 mL  3  . insulin glulisine (APIDRA SOLOSTAR) 100 UNIT/ML injection Inject 15-30 Units into the skin 3 (three) times daily before meals.  45 mL  3  . Lancets (ACCU-CHEK MULTICLIX) lancets by Other route. Use up to 5 times a day to check blood sugar.       [name]  lisinopril-hydrochlorothiazide (PRINZIDE,ZESTORETIC) 20-12.5 MG per tablet Take 1 tablet by mouth daily.  90 tablet  1  . metFORMIN (GLUCOPHAGE) 1000 MG tablet Take 1 tablet (1,000 mg total) by mouth 2 (two) times daily with a meal.  180 tablet  1  . nebivolol (BYSTOLIC) 5 MG tablet Take 1 tablet (5 mg total) by mouth daily.  90 tablet  1  . omeprazole (PRILOSEC) 40 MG capsule Take 1 capsule (40 mg total) by mouth daily.  90 capsule  1  . polyethylene glycol (MIRALAX / GLYCOLAX) packet Take 17 g by mouth daily. 17 grams mixed with water two times a day.         BP 128/80  Temp 97.5 F (36.4 C) (Oral)  Wt 248 lb (112.492 kg)       Objective:   Physical Exam  Constitutional: He is oriented to person, place, and time. He appears well-developed and well-nourished.  HENT:  Head: Normocephalic and atraumatic.       Right cerumen impaction  Cardiovascular: Normal rate, regular rhythm and normal heart sounds.    Pulmonary/Chest: Effort normal and breath sounds normal. He has no wheezes.  Neurological: He is alert and oriented to person, place, and time.  Psychiatric: He has a normal mood and affect. His behavior is normal.          Assessment & Plan:

## 2012-03-18 NOTE — Assessment & Plan Note (Signed)
Right ear irrigated and utilized curette to remove cerumen plug. No complications.

## 2012-03-19 LAB — BASIC METABOLIC PANEL
BUN: 21 mg/dL (ref 6–23)
CO2: 26 mEq/L (ref 19–32)
Calcium: 9.1 mg/dL (ref 8.4–10.5)
Chloride: 101 mEq/L (ref 96–112)
Creat: 1.24 mg/dL (ref 0.50–1.35)
Glucose, Bld: 350 mg/dL — ABNORMAL HIGH (ref 70–99)
Potassium: 4.4 mEq/L (ref 3.5–5.3)
Sodium: 140 mEq/L (ref 135–145)

## 2012-03-19 LAB — HEMOGLOBIN A1C
Hgb A1c MFr Bld: 8.7 % — ABNORMAL HIGH (ref <5.7)
Mean Plasma Glucose: 203 mg/dL — ABNORMAL HIGH (ref <117)

## 2012-03-21 DIAGNOSIS — E1149 Type 2 diabetes mellitus with other diabetic neurological complication: Secondary | ICD-10-CM | POA: Diagnosis not present

## 2012-03-21 DIAGNOSIS — B351 Tinea unguium: Secondary | ICD-10-CM | POA: Diagnosis not present

## 2012-03-27 ENCOUNTER — Encounter: Payer: Self-pay | Admitting: Internal Medicine

## 2012-03-27 ENCOUNTER — Ambulatory Visit (INDEPENDENT_AMBULATORY_CARE_PROVIDER_SITE_OTHER): Payer: Medicare Other | Admitting: Internal Medicine

## 2012-03-27 ENCOUNTER — Telehealth: Payer: Self-pay | Admitting: Internal Medicine

## 2012-03-27 VITALS — BP 162/98 | Temp 97.6°F | Wt 246.0 lb

## 2012-03-27 DIAGNOSIS — IMO0001 Reserved for inherently not codable concepts without codable children: Secondary | ICD-10-CM | POA: Diagnosis not present

## 2012-03-27 DIAGNOSIS — J069 Acute upper respiratory infection, unspecified: Secondary | ICD-10-CM | POA: Diagnosis not present

## 2012-03-27 DIAGNOSIS — M791 Myalgia, unspecified site: Secondary | ICD-10-CM

## 2012-03-27 LAB — POCT INFLUENZA A/B
Influenza A, POC: NEGATIVE
Influenza B, POC: NEGATIVE

## 2012-03-27 NOTE — Progress Notes (Signed)
Subjective:    Patient ID: Luis Yu, male    DOB: December 04, 1945, 67 y.o.   MRN: [id number]  HPI  67 year old white male with history of coronary artery disease and uncontrolled type 2 diabetes complains of right nose, chills, and body aches for last 4-5 days. His symptoms started after seeing his foot doctor. Patient's wife reports foot doctor had flulike symptoms.  Uncontrolled type 2 diabetes-patient's A1c worse at 8.7. However he is having less hypoglycemia. His morning blood sugars have been in the 160s - 200. Patient does not always adjust mealtime insulin dose based on carbohydrate intake.  He has restarted checking his blood sugars regularly.  Review of Systems Negative for shortness of breath  Past Medical History  Diagnosis Date  . Depression     type II, uncontrolled  . Hypertension   . Hyperlipidemia   . GERD (gastroesophageal reflux disease)   . CAD (coronary artery disease)   . Osteoarthritis   . Postherpetic neuralgia   . Herpes zoster   . Adjustment disorder with mixed emotional features   . Diabetic peripheral neuropathy   . Erectile dysfunction   . Obesity     History   Social History  . Marital Status: Married    Spouse Name: N/A    Number of Children: N/A  . Years of Education: N/A   Occupational History  . Retired Naval architect    Social History Main Topics  . Smoking status: Never Smoker   . Smokeless tobacco: Not on file  . Alcohol Use: Yes  . Drug Use: Not on file  . Sexually Active: Not on file   Other Topics Concern  . Not on file   Social History Narrative   married    Past Surgical History  Procedure Date  . Coronary artery bypass graft 1996    eith a LIMA to the LAD, saphenous vein graft to the acute marginal, saphenous vein graft to the PDA and saphenous vein graft to the circumflex.  . Knee arthroscopy 05/2004    right knee  . Replacement total knee bilateral   . Shoulder surgery     Family History  Problem Relation Age  of Onset  . Coronary artery disease Mother     deceased at 66  . Coronary artery disease Father     alive  . Stroke      half brother  . Stroke Brother     No Known Allergies  Current Outpatient Prescriptions on File Prior to Visit  Medication Sig Dispense Refill  . aspirin 325 MG EC tablet Take 325 mg by mouth daily.        [name] atorvastatin (LIPITOR) 80 MG tablet Take 1 tablet (80 mg total) by mouth daily.  90 tablet  1  . buPROPion (WELLBUTRIN XL) 300 MG 24 hr tablet TAKE 1 TABLET BY MOUTH EVERY DAY  90 tablet  1  . gabapentin (NEURONTIN) 300 MG capsule Take 1 capsule (300 mg total) by mouth at bedtime as needed. For neuropathy.  90 capsule  3  . glucose blood (ACCU-CHEK AVIVA) test strip Use to check blood sugar 5 times a day as instructed       . insulin glargine (LANTUS SOLOSTAR) 100 UNIT/ML injection Inject 15 units in the evening subcutaneously.  45 mL  3  . insulin glulisine (APIDRA SOLOSTAR) 100 UNIT/ML injection Inject 15-30 Units into the skin 3 (three) times daily before meals.  45 mL  3  . Insulin Pen Needle (  PEN NEEDLES 31GX5/16") 31G X 8 MM MISC Use to inject insulin 5 times a day.  100 each  11  . Lancets (ACCU-CHEK MULTICLIX) lancets by Other route. Use up to 5 times a day to check blood sugar.       . lisinopril-hydrochlorothiazide (PRINZIDE,ZESTORETIC) 20-12.5 MG per tablet Take 1 tablet by mouth daily.  90 tablet  1  . metFORMIN (GLUCOPHAGE) 1000 MG tablet Take 1 tablet (1,000 mg total) by mouth 2 (two) times daily with a meal.  180 tablet  1  . nebivolol (BYSTOLIC) 5 MG tablet Take 1 tablet (5 mg total) by mouth daily.  90 tablet  1  . omeprazole (PRILOSEC) 40 MG capsule Take 1 capsule (40 mg total) by mouth daily.  90 capsule  1  . polyethylene glycol (MIRALAX / GLYCOLAX) packet Take 17 g by mouth daily. 17 grams mixed with water two times a day.         BP 162/98  Temp 97.6 F (36.4 C) (Oral)  Wt 246 lb (111.585 kg)       Objective:   Physical Exam    Constitutional: He is oriented to person, place, and time. He appears well-developed and well-nourished.  HENT:  Head: Normocephalic and atraumatic.  Right Ear: External ear normal.  Left Ear: External ear normal.       Oropharyngeal erythema  Neck: Neck supple.       No neck tenderness  Cardiovascular: Normal rate, regular rhythm and normal heart sounds.   Pulmonary/Chest: Effort normal and breath sounds normal. He has no wheezes.  Neurological: He is alert and oriented to person, place, and time. No cranial nerve deficit.  Skin: Skin is warm and dry.  Psychiatric: He has a normal mood and affect. His behavior is normal.          Assessment & Plan:

## 2012-03-27 NOTE — Telephone Encounter (Signed)
[facility] Triage Call Report Triage Record Num: [id number] Operator: Jeraldine Loots Patient Name: Luis Yu Call Date & Time: 03/26/2012 5:48:08PM Patient Phone: [phone] PCP: Thomos Lemons Patient Gender: Male PCP Fax : [phone] Patient DOB: Jan 28, 1946 [facility] Name[facility] Reason for Call: Caller: Floyce Stakes; PCP: Ethelene Hal Molly Maduro) (Adults only); CB#: [phone]; Call regarding sinus h/a and congestion; body aches. Triaged per Flu like sx. Sent to [facility] this evening for evaluation. Protocol(s) Used: Flu-Like Symptoms Protocol(s) Used: Upper Respiratory Infection (URI) Recommended Outcome per Protocol: Call Provider within 8 Hours Reason for Outcome: Sudden onset of flu-like symptoms New OR worsening flu-like illness AND in high risk group for complications Care Advice: ~ INFECTION CONTROL 01/08/

## 2012-03-27 NOTE — Assessment & Plan Note (Signed)
Blood sugar control worse. Patient encouraged to check his blood sugars regularly. He understands postprandial blood sugar goal is approximately 150. Increase Lantus to 18 units at bedtime. Reassess in 4 weeks.

## 2012-03-27 NOTE — Assessment & Plan Note (Signed)
67 year old white male with signs and symptoms of possible flulike illness. Rapid influenza testing negative. It's been already 4-5 days since symptoms first started. I doubt Tamiflu would be helpful. His chest is clear. We discussed monitoring for complications of influenza. Symptomatic care for now.

## 2012-03-29 DIAGNOSIS — Z79899 Other long term (current) drug therapy: Secondary | ICD-10-CM | POA: Diagnosis not present

## 2012-03-29 DIAGNOSIS — Z794 Long term (current) use of insulin: Secondary | ICD-10-CM | POA: Diagnosis not present

## 2012-03-29 DIAGNOSIS — Z7982 Long term (current) use of aspirin: Secondary | ICD-10-CM | POA: Diagnosis not present

## 2012-03-29 DIAGNOSIS — E119 Type 2 diabetes mellitus without complications: Secondary | ICD-10-CM | POA: Diagnosis not present

## 2012-03-29 DIAGNOSIS — I10 Essential (primary) hypertension: Secondary | ICD-10-CM | POA: Diagnosis not present

## 2012-03-29 DIAGNOSIS — E785 Hyperlipidemia, unspecified: Secondary | ICD-10-CM | POA: Diagnosis not present

## 2012-03-29 DIAGNOSIS — H109 Unspecified conjunctivitis: Secondary | ICD-10-CM | POA: Diagnosis not present

## 2012-03-29 DIAGNOSIS — I2510 Atherosclerotic heart disease of native coronary artery without angina pectoris: Secondary | ICD-10-CM | POA: Diagnosis not present

## 2012-04-01 DIAGNOSIS — H0019 Chalazion unspecified eye, unspecified eyelid: Secondary | ICD-10-CM | POA: Diagnosis not present

## 2012-04-11 DIAGNOSIS — Z96659 Presence of unspecified artificial knee joint: Secondary | ICD-10-CM | POA: Diagnosis not present

## 2012-04-15 ENCOUNTER — Other Ambulatory Visit (HOSPITAL_COMMUNITY): Payer: Self-pay | Admitting: Orthopedic Surgery

## 2012-04-15 DIAGNOSIS — M25562 Pain in left knee: Secondary | ICD-10-CM

## 2012-04-15 DIAGNOSIS — T84039A Mechanical loosening of unspecified internal prosthetic joint, initial encounter: Secondary | ICD-10-CM

## 2012-04-23 ENCOUNTER — Encounter (HOSPITAL_COMMUNITY)
Admission: RE | Admit: 2012-04-23 | Discharge: 2012-04-23 | Disposition: A | Discharge status: Home / Self Care | Payer: Medicare Other | Source: Ambulatory Visit | Attending: Orthopedic Surgery | Admitting: Orthopedic Surgery

## 2012-04-23 DIAGNOSIS — Z96659 Presence of unspecified artificial knee joint: Secondary | ICD-10-CM | POA: Insufficient documentation

## 2012-04-23 DIAGNOSIS — M25469 Effusion, unspecified knee: Secondary | ICD-10-CM | POA: Insufficient documentation

## 2012-04-23 DIAGNOSIS — T84039A Mechanical loosening of unspecified internal prosthetic joint, initial encounter: Secondary | ICD-10-CM

## 2012-04-23 DIAGNOSIS — M25569 Pain in unspecified knee: Secondary | ICD-10-CM | POA: Insufficient documentation

## 2012-04-23 DIAGNOSIS — M25562 Pain in left knee: Secondary | ICD-10-CM

## 2012-04-23 MED ORDER — TECHNETIUM TC 99M MEDRONATE IV KIT
25.0000 | PACK | Freq: Once | INTRAVENOUS | Status: AC | PRN
  Administered 2012-04-23: 25 via INTRAVENOUS

## 2012-04-24 ENCOUNTER — Ambulatory Visit (INDEPENDENT_AMBULATORY_CARE_PROVIDER_SITE_OTHER): Payer: Medicare Other | Admitting: Internal Medicine

## 2012-04-24 ENCOUNTER — Encounter: Payer: Self-pay | Admitting: Internal Medicine

## 2012-04-24 VITALS — BP 154/90 | HR 64 | Temp 98.3°F | Wt 251.0 lb

## 2012-04-24 DIAGNOSIS — IMO0001 Reserved for inherently not codable concepts without codable children: Secondary | ICD-10-CM

## 2012-04-24 NOTE — Assessment & Plan Note (Signed)
Patient still experiencing intermittent hypoglycemic episodes.  These occur in the afternoon. Maintain same dose of Lantus. Decrease Apridra dose with AM meal. Increase Apridra dose with evening meal.  Fasting AM blood sugars >200.

## 2012-04-24 NOTE — Progress Notes (Signed)
Subjective:    Patient ID: Luis Yu, male    DOB: 11/18/1945, 67 y.o.   MRN: [id number]  HPI  67 year old white male with uncontrolled type 2 diabetes, coronary artery disease and hypertension for followup. Patient has had 2 - 3 hypoglycemic episodes since previous visit. Low blood sugar readings usually occur in the afternoon. Patient reports using up to 30 units of Apidra up with morning meal if he has a large breakfast. Patient is currently using 18 units with evening meal. Wife reports he does not eat lunch or has small snack.  Review of Systems Negative for chest pain or shortness of breath  Past Medical History  Diagnosis Date  . Depression     type II, uncontrolled  . Hypertension   . Hyperlipidemia   . GERD (gastroesophageal reflux disease)   . CAD (coronary artery disease)   . Osteoarthritis   . Postherpetic neuralgia   . Herpes zoster   . Adjustment disorder with mixed emotional features   . Diabetic peripheral neuropathy   . Erectile dysfunction   . Obesity     History   Social History  . Marital Status: Married    Spouse Name: N/A    Number of Children: N/A  . Years of Education: N/A   Occupational History  . Retired Naval architect    Social History Main Topics  . Smoking status: Never Smoker   . Smokeless tobacco: Not on file  . Alcohol Use: Yes  . Drug Use: Not on file  . Sexually Active: Not on file   Other Topics Concern  . Not on file   Social History Narrative   married    Past Surgical History  Procedure Date  . Coronary artery bypass graft 1996    eith a LIMA to the LAD, saphenous vein graft to the acute marginal, saphenous vein graft to the PDA and saphenous vein graft to the circumflex.  . Knee arthroscopy 05/2004    right knee  . Replacement total knee bilateral   . Shoulder surgery     Family History  Problem Relation Age of Onset  . Coronary artery disease Mother     deceased at 63  . Coronary artery disease Father    alive  . Stroke      half brother  . Stroke Brother     No Known Allergies  Current Outpatient Prescriptions on File Prior to Visit  Medication Sig Dispense Refill  . aspirin 325 MG EC tablet Take 325 mg by mouth daily.        [name] atorvastatin (LIPITOR) 80 MG tablet Take 1 tablet (80 mg total) by mouth daily.  90 tablet  1  . buPROPion (WELLBUTRIN XL) 300 MG 24 hr tablet TAKE 1 TABLET BY MOUTH EVERY DAY  90 tablet  1  . gabapentin (NEURONTIN) 300 MG capsule Take 1 capsule (300 mg total) by mouth at bedtime as needed. For neuropathy.  90 capsule  3  . glucose blood (ACCU-CHEK AVIVA) test strip Use to check blood sugar 5 times a day as instructed       . insulin glargine (LANTUS SOLOSTAR) 100 UNIT/ML injection Inject 18 units in the evening subcutaneously.  45 mL  3  . insulin glulisine (APIDRA SOLOSTAR) 100 UNIT/ML injection Inject 15-30 Units into the skin 3 (three) times daily before meals.  45 mL  3  . Insulin Pen Needle (PEN NEEDLES 31GX5/16") 31G X 8 MM MISC Use to inject insulin 5 times  a day.  100 each  11  . Lancets (ACCU-CHEK MULTICLIX) lancets by Other route. Use up to 5 times a day to check blood sugar.       . lisinopril-hydrochlorothiazide (PRINZIDE,ZESTORETIC) 20-12.5 MG per tablet Take 1 tablet by mouth daily.  90 tablet  1  . metFORMIN (GLUCOPHAGE) 1000 MG tablet Take 1 tablet (1,000 mg total) by mouth 2 (two) times daily with a meal.  180 tablet  1  . nebivolol (BYSTOLIC) 5 MG tablet Take 1 tablet (5 mg total) by mouth daily.  90 tablet  1  . omeprazole (PRILOSEC) 40 MG capsule Take 1 capsule (40 mg total) by mouth daily.  90 capsule  1  . polyethylene glycol (MIRALAX / GLYCOLAX) packet Take 17 g by mouth daily. 17 grams mixed with water two times a day.         BP 154/90  Pulse 64  Temp 98.3 F (36.8 C) (Oral)  Wt 251 lb (113.853 kg)       Objective:   Physical Exam  Constitutional: He is oriented to person, place, and time. He appears well-developed and  well-nourished.  Cardiovascular: Normal rate, regular rhythm and normal heart sounds.   No murmur heard. Pulmonary/Chest: Effort normal and breath sounds normal. He has no wheezes.  Neurological: He is alert and oriented to person, place, and time. No cranial nerve deficit.  Skin: Skin is warm and dry.       Foot exam-no cracks or fissures, normal pulses  Psychiatric: He has a normal mood and affect. His behavior is normal.          Assessment & Plan:

## 2012-04-24 NOTE — Patient Instructions (Addendum)
Decrease your apidra use for your AM meal to 20 units Increase your apidra use for your PM meal to 25-30 units Please complete the following lab tests before your next follow up appointment: BMET, A1c - 250.02

## 2012-04-29 ENCOUNTER — Other Ambulatory Visit: Payer: Self-pay | Admitting: Internal Medicine

## 2012-05-22 DIAGNOSIS — Z96659 Presence of unspecified artificial knee joint: Secondary | ICD-10-CM | POA: Diagnosis not present

## 2012-05-26 ENCOUNTER — Other Ambulatory Visit: Payer: Self-pay | Admitting: Internal Medicine

## 2012-06-13 ENCOUNTER — Ambulatory Visit: Payer: Medicare Other | Admitting: Internal Medicine

## 2012-06-13 ENCOUNTER — Other Ambulatory Visit: Payer: Medicare Other

## 2012-06-16 ENCOUNTER — Ambulatory Visit: Payer: Medicare Other | Admitting: Internal Medicine

## 2012-06-23 ENCOUNTER — Other Ambulatory Visit: Payer: Medicare Other

## 2012-06-24 ENCOUNTER — Other Ambulatory Visit (INDEPENDENT_AMBULATORY_CARE_PROVIDER_SITE_OTHER): Payer: Medicare Other

## 2012-06-24 DIAGNOSIS — E1110 Type 2 diabetes mellitus with ketoacidosis without coma: Secondary | ICD-10-CM | POA: Diagnosis not present

## 2012-06-24 LAB — BASIC METABOLIC PANEL
BUN: 24 mg/dL — ABNORMAL HIGH (ref 6–23)
CO2: 29 mEq/L (ref 19–32)
Calcium: 9 mg/dL (ref 8.4–10.5)
Chloride: 102 mEq/L (ref 96–112)
Creatinine, Ser: 1 mg/dL (ref 0.4–1.5)
GFR: 78.07 mL/min (ref >60.00)
Glucose, Bld: 168 mg/dL — ABNORMAL HIGH (ref 70–99)
Potassium: 3.9 mEq/L (ref 3.5–5.1)
Sodium: 139 mEq/L (ref 135–145)

## 2012-06-24 LAB — HEMOGLOBIN A1C: Hgb A1c MFr Bld: 8.7 % — ABNORMAL HIGH (ref 4.6–6.5)

## 2012-06-27 ENCOUNTER — Ambulatory Visit: Payer: Medicare Other | Admitting: Internal Medicine

## 2012-06-27 ENCOUNTER — Ambulatory Visit (INDEPENDENT_AMBULATORY_CARE_PROVIDER_SITE_OTHER): Payer: Medicare Other | Admitting: Internal Medicine

## 2012-06-27 VITALS — BP 136/80 | HR 68 | Temp 97.9°F | Wt 246.0 lb

## 2012-06-27 DIAGNOSIS — E1142 Type 2 diabetes mellitus with diabetic polyneuropathy: Secondary | ICD-10-CM | POA: Diagnosis not present

## 2012-06-27 DIAGNOSIS — R413 Other amnesia: Secondary | ICD-10-CM | POA: Diagnosis not present

## 2012-06-27 DIAGNOSIS — E1149 Type 2 diabetes mellitus with other diabetic neurological complication: Secondary | ICD-10-CM

## 2012-06-27 MED ORDER — OMEPRAZOLE 40 MG PO CPDR: 40.0000 mg | DELAYED_RELEASE_CAPSULE | Freq: Every day | ORAL | Status: DC

## 2012-06-27 MED ORDER — BUPROPION HCL ER (XL) 300 MG PO TB24: 300.0000 mg | ORAL_TABLET | Freq: Every day | ORAL | Status: DC

## 2012-06-27 MED ORDER — INSULIN GLARGINE 100 UNIT/ML ~~LOC~~ SOLN: SUBCUTANEOUS | Status: DC

## 2012-06-27 MED ORDER — LISINOPRIL-HYDROCHLOROTHIAZIDE 20-12.5 MG PO TABS: 1.0000 | ORAL_TABLET | Freq: Every day | ORAL | Status: DC

## 2012-06-27 MED ORDER — ATORVASTATIN CALCIUM 80 MG PO TABS: 80.0000 mg | ORAL_TABLET | Freq: Every day | ORAL | Status: DC

## 2012-06-27 MED ORDER — INSULIN GLULISINE 100 UNIT/ML ~~LOC~~ SOLN: 15.0000 [IU] | Freq: Three times a day (TID) | SUBCUTANEOUS | Status: DC

## 2012-06-27 MED ORDER — NEBIVOLOL HCL 5 MG PO TABS: 5.0000 mg | ORAL_TABLET | Freq: Every day | ORAL | Status: DC

## 2012-06-27 MED ORDER — GABAPENTIN 300 MG PO CAPS: 300.0000 mg | ORAL_CAPSULE | Freq: Every evening | ORAL | Status: DC | PRN

## 2012-06-27 MED ORDER — METFORMIN HCL 1000 MG PO TABS: 1000.0000 mg | ORAL_TABLET | Freq: Two times a day (BID) | ORAL | Status: DC

## 2012-06-27 NOTE — Patient Instructions (Addendum)
Please complete the following lab tests before your next follow up appointment: BMET, A1c - 250.02 FLP, LFTs, TSH - 272.4 Vitamin B12 - 414.00

## 2012-06-27 NOTE — Assessment & Plan Note (Signed)
Patient's A1c is unchanged. However he is experiencing less hypoglycemia. His morning blood sugar is 160. Titrate Lantus dose higher to 22 units.  Reassess in 3 months Lab Results  Component Value Date   HGBA1C 8.7* 06/24/2012

## 2012-06-27 NOTE — Progress Notes (Signed)
Subjective:    Patient ID: Luis Yu, male    DOB: 1945-08-08, 67 y.o.   MRN: [id number]  HPI  67 year old white male with uncontrolled type 2 diabetes, coronary artery disease and hypertension for followup. At previous visit patient's Apidra dose was adjusted. He was advised to take lower dose with morning meal and high dose with the evening meal. He reports less issues with hypoglycemia. His A1c is unchanged.  His morning blood sugars are usually around 160.  Review of Systems Negative for chest pain.  Patient reports getting over a cold recently  Past Medical History  Diagnosis Date  . Depression     type II, uncontrolled  . Hypertension   . Hyperlipidemia   . GERD (gastroesophageal reflux disease)   . CAD (coronary artery disease)   . Osteoarthritis   . Postherpetic neuralgia   . Herpes zoster   . Adjustment disorder with mixed emotional features   . Diabetic peripheral neuropathy   . Erectile dysfunction   . Obesity     History   Social History  . Marital Status: Married    Spouse Name: N/A    Number of Children: N/A  . Years of Education: N/A   Occupational History  . Retired Naval architect    Social History Main Topics  . Smoking status: Never Smoker   . Smokeless tobacco: Not on file  . Alcohol Use: Yes  . Drug Use: Not on file  . Sexually Active: Not on file   Other Topics Concern  . Not on file   Social History Narrative   married    Past Surgical History  Procedure Laterality Date  . Coronary artery bypass graft  1996    eith a LIMA to the LAD, saphenous vein graft to the acute marginal, saphenous vein graft to the PDA and saphenous vein graft to the circumflex.  . Knee arthroscopy  05/2004    right knee  . Replacement total knee bilateral    . Shoulder surgery      Family History  Problem Relation Age of Onset  . Coronary artery disease Mother     deceased at 39  . Coronary artery disease Father     alive  . Stroke      half brother   . Stroke Brother     No Known Allergies  Current Outpatient Prescriptions on File Prior to Visit  Medication Sig Dispense Refill  . aspirin 325 MG EC tablet Take 325 mg by mouth daily.        [name] glucose blood (ACCU-CHEK AVIVA) test strip Use to check blood sugar 5 times a day as instructed       . Insulin Pen Needle (PEN NEEDLES 31GX5/16") 31G X 8 MM MISC Use to inject insulin 5 times a day.  100 each  11  . Lancets (ACCU-CHEK MULTICLIX) lancets by Other route. Use up to 5 times a day to check blood sugar.       . polyethylene glycol (MIRALAX / GLYCOLAX) packet Take 17 g by mouth daily. 17 grams mixed with water two times a day.        No current facility-administered medications on file prior to visit.    BP 136/80  Pulse 68  Temp(Src) 97.9 F (36.6 C) (Oral)  Wt 246 lb (111.585 kg)  BMI 31.57 kg/m2       Objective:   Physical Exam  Constitutional: He is oriented to person, place, and time. He appears well-developed  and well-nourished.  Cardiovascular: Normal rate, regular rhythm and normal heart sounds.   Pulmonary/Chest: Effort normal. He has no wheezes.  Neurological: He is alert and oriented to person, place, and time. No cranial nerve deficit.  Psychiatric: He has a normal mood and affect. His behavior is normal.          Assessment & Plan:

## 2012-06-27 NOTE — Assessment & Plan Note (Addendum)
Patient's TSH, B12 and RPR was normal.  Patient has not completed neuro psych testing.

## 2012-07-12 DIAGNOSIS — M25519 Pain in unspecified shoulder: Secondary | ICD-10-CM | POA: Diagnosis not present

## 2012-07-18 ENCOUNTER — Other Ambulatory Visit: Payer: Self-pay | Admitting: Internal Medicine

## 2012-07-27 DIAGNOSIS — L02419 Cutaneous abscess of limb, unspecified: Secondary | ICD-10-CM | POA: Diagnosis not present

## 2012-07-27 DIAGNOSIS — J069 Acute upper respiratory infection, unspecified: Secondary | ICD-10-CM | POA: Diagnosis not present

## 2012-08-05 ENCOUNTER — Other Ambulatory Visit: Payer: Self-pay | Admitting: Internal Medicine

## 2012-08-21 ENCOUNTER — Other Ambulatory Visit: Payer: Self-pay | Admitting: Internal Medicine

## 2012-08-22 DIAGNOSIS — M62838 Other muscle spasm: Secondary | ICD-10-CM | POA: Diagnosis not present

## 2012-08-22 DIAGNOSIS — M542 Cervicalgia: Secondary | ICD-10-CM | POA: Diagnosis not present

## 2012-08-22 DIAGNOSIS — R7309 Other abnormal glucose: Secondary | ICD-10-CM | POA: Diagnosis not present

## 2012-08-22 DIAGNOSIS — M5030 Other cervical disc degeneration, unspecified cervical region: Secondary | ICD-10-CM | POA: Diagnosis not present

## 2012-08-22 DIAGNOSIS — H938X9 Other specified disorders of ear, unspecified ear: Secondary | ICD-10-CM | POA: Diagnosis not present

## 2012-08-22 DIAGNOSIS — S139XXA Sprain of joints and ligaments of unspecified parts of neck, initial encounter: Secondary | ICD-10-CM | POA: Diagnosis not present

## 2012-08-25 DIAGNOSIS — R072 Precordial pain: Secondary | ICD-10-CM | POA: Diagnosis not present

## 2012-08-25 DIAGNOSIS — I25810 Atherosclerosis of coronary artery bypass graft(s) without angina pectoris: Secondary | ICD-10-CM | POA: Diagnosis not present

## 2012-08-25 DIAGNOSIS — M47812 Spondylosis without myelopathy or radiculopathy, cervical region: Secondary | ICD-10-CM | POA: Diagnosis not present

## 2012-08-25 DIAGNOSIS — M542 Cervicalgia: Secondary | ICD-10-CM | POA: Diagnosis not present

## 2012-08-25 DIAGNOSIS — G589 Mononeuropathy, unspecified: Secondary | ICD-10-CM | POA: Diagnosis present

## 2012-08-25 DIAGNOSIS — E119 Type 2 diabetes mellitus without complications: Secondary | ICD-10-CM | POA: Diagnosis not present

## 2012-08-25 DIAGNOSIS — E7800 Pure hypercholesterolemia, unspecified: Secondary | ICD-10-CM | POA: Diagnosis not present

## 2012-08-25 DIAGNOSIS — Z794 Long term (current) use of insulin: Secondary | ICD-10-CM | POA: Diagnosis not present

## 2012-08-25 DIAGNOSIS — E785 Hyperlipidemia, unspecified: Secondary | ICD-10-CM | POA: Diagnosis not present

## 2012-08-25 DIAGNOSIS — M25519 Pain in unspecified shoulder: Secondary | ICD-10-CM | POA: Diagnosis not present

## 2012-08-25 DIAGNOSIS — Z9861 Coronary angioplasty status: Secondary | ICD-10-CM | POA: Diagnosis not present

## 2012-08-25 DIAGNOSIS — IMO0002 Reserved for concepts with insufficient information to code with codable children: Secondary | ICD-10-CM | POA: Diagnosis present

## 2012-08-25 DIAGNOSIS — I10 Essential (primary) hypertension: Secondary | ICD-10-CM | POA: Diagnosis not present

## 2012-08-25 DIAGNOSIS — S139XXA Sprain of joints and ligaments of unspecified parts of neck, initial encounter: Secondary | ICD-10-CM | POA: Diagnosis present

## 2012-08-25 DIAGNOSIS — R079 Chest pain, unspecified: Secondary | ICD-10-CM | POA: Diagnosis not present

## 2012-08-25 DIAGNOSIS — I2510 Atherosclerotic heart disease of native coronary artery without angina pectoris: Secondary | ICD-10-CM | POA: Diagnosis not present

## 2012-08-25 DIAGNOSIS — R0602 Shortness of breath: Secondary | ICD-10-CM | POA: Diagnosis not present

## 2012-08-27 ENCOUNTER — Telehealth: Payer: Self-pay | Admitting: Cardiology

## 2012-08-27 DIAGNOSIS — E785 Hyperlipidemia, unspecified: Secondary | ICD-10-CM | POA: Diagnosis not present

## 2012-08-27 DIAGNOSIS — E119 Type 2 diabetes mellitus without complications: Secondary | ICD-10-CM | POA: Diagnosis not present

## 2012-08-27 DIAGNOSIS — I10 Essential (primary) hypertension: Secondary | ICD-10-CM | POA: Diagnosis not present

## 2012-08-27 DIAGNOSIS — M25519 Pain in unspecified shoulder: Secondary | ICD-10-CM | POA: Diagnosis not present

## 2012-08-27 NOTE — Telephone Encounter (Signed)
New problem    Pt is in the hospital in [facility]they are wanting to do a cath due to limited blood flow at the bottom of his heart but pts wife wants to speak to someone before they have this done because they would prefer to have it done here in [place]-pts wife aware that dr Jens Som nor his nurse are in the office today

## 2012-08-27 NOTE — Telephone Encounter (Signed)
Pt went to ED / [facility]. Pt was seen for neck pain, denied cp/sob. Stress test was done per wife since one was not done in 2 years. Dr is wanting to proceed with a heart cath. Wife upset, wants pt to have testing done here. Advised to speak with cardiologist; see if can transfer hospital to hospital, see if can be driven home, told her to call back with any records that are needed, name/ fax numbers provided. Wife agreed to plan.

## 2012-08-27 NOTE — Telephone Encounter (Signed)
Dr Ellis Parents called from [facility], faxed records to him @ [phone].

## 2012-09-02 DIAGNOSIS — M542 Cervicalgia: Secondary | ICD-10-CM | POA: Diagnosis not present

## 2012-09-02 DIAGNOSIS — M5030 Other cervical disc degeneration, unspecified cervical region: Secondary | ICD-10-CM | POA: Diagnosis not present

## 2012-09-05 ENCOUNTER — Other Ambulatory Visit: Payer: Self-pay | Admitting: Internal Medicine

## 2012-09-10 DIAGNOSIS — E11319 Type 2 diabetes mellitus with unspecified diabetic retinopathy without macular edema: Secondary | ICD-10-CM | POA: Diagnosis not present

## 2012-09-10 DIAGNOSIS — M542 Cervicalgia: Secondary | ICD-10-CM | POA: Diagnosis not present

## 2012-09-10 DIAGNOSIS — E119 Type 2 diabetes mellitus without complications: Secondary | ICD-10-CM | POA: Diagnosis not present

## 2012-09-10 DIAGNOSIS — H02409 Unspecified ptosis of unspecified eyelid: Secondary | ICD-10-CM | POA: Diagnosis not present

## 2012-09-10 DIAGNOSIS — Z961 Presence of intraocular lens: Secondary | ICD-10-CM | POA: Diagnosis not present

## 2012-09-16 DIAGNOSIS — M5030 Other cervical disc degeneration, unspecified cervical region: Secondary | ICD-10-CM | POA: Diagnosis not present

## 2012-09-23 ENCOUNTER — Other Ambulatory Visit: Payer: Medicare Other

## 2012-09-25 ENCOUNTER — Other Ambulatory Visit: Payer: Self-pay

## 2012-09-26 ENCOUNTER — Ambulatory Visit: Payer: Medicare Other | Admitting: Internal Medicine

## 2012-10-09 ENCOUNTER — Other Ambulatory Visit: Payer: Medicare Other

## 2012-10-13 ENCOUNTER — Ambulatory Visit: Payer: Medicare Other | Admitting: Family Medicine

## 2012-10-21 ENCOUNTER — Other Ambulatory Visit: Payer: Self-pay | Admitting: Internal Medicine

## 2012-10-29 ENCOUNTER — Other Ambulatory Visit (INDEPENDENT_AMBULATORY_CARE_PROVIDER_SITE_OTHER): Payer: Medicare Other

## 2012-10-29 DIAGNOSIS — I2510 Atherosclerotic heart disease of native coronary artery without angina pectoris: Secondary | ICD-10-CM

## 2012-10-29 DIAGNOSIS — E785 Hyperlipidemia, unspecified: Secondary | ICD-10-CM | POA: Diagnosis not present

## 2012-10-29 DIAGNOSIS — IMO0001 Reserved for inherently not codable concepts without codable children: Secondary | ICD-10-CM

## 2012-10-29 LAB — BASIC METABOLIC PANEL
BUN: 20 mg/dL (ref 6–23)
CO2: 28 mEq/L (ref 19–32)
Calcium: 9.4 mg/dL (ref 8.4–10.5)
Chloride: 103 mEq/L (ref 96–112)
Creatinine, Ser: 1.1 mg/dL (ref 0.4–1.5)
GFR: 72.19 mL/min (ref >60.00)
Glucose, Bld: 200 mg/dL — ABNORMAL HIGH (ref 70–99)
Potassium: 4.4 mEq/L (ref 3.5–5.1)
Sodium: 138 mEq/L (ref 135–145)

## 2012-10-29 LAB — LIPID PANEL
Cholesterol: 149 mg/dL (ref 0–200)
HDL: 48.3 mg/dL (ref >39.00)
LDL Cholesterol: 81 mg/dL (ref 0–99)
Total CHOL/HDL Ratio: 3
Triglycerides: 100 mg/dL (ref 0.0–149.0)
VLDL: 20 mg/dL (ref 0.0–40.0)

## 2012-10-29 LAB — HEPATIC FUNCTION PANEL
ALT: 22 U/L (ref 0–53)
AST: 22 U/L (ref 0–37)
Albumin: 3.9 g/dL (ref 3.5–5.2)
Alkaline Phosphatase: 74 U/L (ref 39–117)
Bilirubin, Direct: 0.1 mg/dL (ref 0.0–0.3)
Total Bilirubin: 0.9 mg/dL (ref 0.3–1.2)
Total Protein: 6.8 g/dL (ref 6.0–8.3)

## 2012-10-29 LAB — TSH: TSH: 2.63 u[IU]/mL (ref 0.35–5.50)

## 2012-10-29 LAB — HEMOGLOBIN A1C: Hgb A1c MFr Bld: 8.9 % — ABNORMAL HIGH (ref 4.6–6.5)

## 2012-10-29 LAB — VITAMIN B12: Vitamin B-12: 386 pg/mL (ref 211–911)

## 2012-11-03 ENCOUNTER — Ambulatory Visit (INDEPENDENT_AMBULATORY_CARE_PROVIDER_SITE_OTHER): Payer: Medicare Other | Admitting: Internal Medicine

## 2012-11-03 ENCOUNTER — Encounter: Payer: Self-pay | Admitting: Internal Medicine

## 2012-11-03 VITALS — BP 130/70 | HR 80 | Temp 97.4°F | Wt 246.0 lb

## 2012-11-03 DIAGNOSIS — R413 Other amnesia: Secondary | ICD-10-CM

## 2012-11-03 DIAGNOSIS — E1149 Type 2 diabetes mellitus with other diabetic neurological complication: Secondary | ICD-10-CM

## 2012-11-03 DIAGNOSIS — E1142 Type 2 diabetes mellitus with diabetic polyneuropathy: Secondary | ICD-10-CM | POA: Diagnosis not present

## 2012-11-03 DIAGNOSIS — I2510 Atherosclerotic heart disease of native coronary artery without angina pectoris: Secondary | ICD-10-CM | POA: Diagnosis not present

## 2012-11-03 DIAGNOSIS — F4323 Adjustment disorder with mixed anxiety and depressed mood: Secondary | ICD-10-CM | POA: Diagnosis not present

## 2012-11-03 NOTE — Assessment & Plan Note (Signed)
Patient was evaluated for severe neck pain in June 2014 at medical center at [facility].  He reportedly had EKG change and abnormal stress test.  Arrange follow up with Dr. Jens Som.

## 2012-11-03 NOTE — Assessment & Plan Note (Signed)
Still awaiting neuro psych testing.

## 2012-11-03 NOTE — Progress Notes (Signed)
Subjective:    Patient ID: Luis Yu, male    DOB: 01-06-1946, 67 y.o.   MRN: [id number]  HPI  67 year old white male with uncontrolled type 2 diabetes, coronary artery disease and adjustment disorder for followup. Patient's blood sugar control/A1c is worse. He attributes to stress in early June. He has been spending most of the summer at his vacation home in [place]. He experienced episode of severe neck pain from June 9 through June 11. His symptoms progressively worsen to the point where he needed to be hospitalized. His blood sugar at that time was greater than 600.  Patient also apparently had EKG changes and he was seen by cardiologist. Cardiac cath was deferred because he has known stent restenosis.  Patient only checks blood sugars intermittently (mainly in the morning).  His wife reports he has labile mood. He usually does not check his blood sugar during "agitation" episodes.  Review of Systems Negative for chest pain. Neck pain has improved.    Past Medical History  Diagnosis Date  . Depression     type II, uncontrolled  . Hypertension   . Hyperlipidemia   . GERD (gastroesophageal reflux disease)   . CAD (coronary artery disease)   . Osteoarthritis   . Postherpetic neuralgia   . Herpes zoster   . Adjustment disorder with mixed emotional features   . Diabetic peripheral neuropathy   . Erectile dysfunction   . Obesity     History   Social History  . Marital Status: Married    Spouse Name: N/A    Number of Children: N/A  . Years of Education: N/A   Occupational History  . Retired Naval architect    Social History Main Topics  . Smoking status: Never Smoker   . Smokeless tobacco: Not on file  . Alcohol Use: Yes  . Drug Use: Not on file  . Sexual Activity: Not on file   Other Topics Concern  . Not on file   Social History Narrative   married    Past Surgical History  Procedure Laterality Date  . Coronary artery bypass graft  1996    eith a LIMA  to the LAD, saphenous vein graft to the acute marginal, saphenous vein graft to the PDA and saphenous vein graft to the circumflex.  . Knee arthroscopy  05/2004    right knee  . Replacement total knee bilateral    . Shoulder surgery      Family History  Problem Relation Age of Onset  . Coronary artery disease Mother     deceased at 92  . Coronary artery disease Father     alive  . Stroke      half brother  . Stroke Brother     No Known Allergies  Current Outpatient Prescriptions on File Prior to Visit  Medication Sig Dispense Refill  . aspirin 325 MG EC tablet Take 325 mg by mouth daily.        [name] atorvastatin (LIPITOR) 80 MG tablet Take 1 tablet (80 mg total) by mouth daily.  90 tablet  3  . buPROPion (WELLBUTRIN XL) 300 MG 24 hr tablet TAKE 1 TABLET BY MOUTH EVERY DAY  90 tablet  1  . BYSTOLIC 5 MG tablet TAKE 1 TABLET EVERY DAY  90 tablet  2  . gabapentin (NEURONTIN) 300 MG capsule Take 1 capsule (300 mg total) by mouth at bedtime as needed. For neuropathy.  90 capsule  3  . glucose blood (ACCU-CHEK AVIVA)  test strip Use to check blood sugar 5 times a day as instructed       . insulin glargine (LANTUS SOLOSTAR) 100 UNIT/ML injection Inject 22 units in the evening subcutaneously.  45 mL  3  . insulin glulisine (APIDRA SOLOSTAR) 100 UNIT/ML injection Inject 15-30 Units into the skin 3 (three) times daily before meals.  45 mL  3  . Insulin Pen Needle (PEN NEEDLES 31GX5/16") 31G X 8 MM MISC Use to inject insulin 5 times a day.  100 each  11  . Lancets (ACCU-CHEK MULTICLIX) lancets by Other route. Use up to 5 times a day to check blood sugar.       . lisinopril-hydrochlorothiazide (PRINZIDE,ZESTORETIC) 20-12.5 MG per tablet TAKE 1 TABLET BY MOUTH DAILY  90 tablet  1  . metFORMIN (GLUCOPHAGE) 1000 MG tablet Take 1 tablet (1,000 mg total) by mouth 2 (two) times daily with a meal.  180 tablet  1  . omeprazole (PRILOSEC) 40 MG capsule TAKE ONE CAPSULE BY MOUTH DAILY  90 capsule  1  .  polyethylene glycol (MIRALAX / GLYCOLAX) packet Take 17 g by mouth daily. 17 grams mixed with water two times a day.        No current facility-administered medications on file prior to visit.    BP 130/70  Pulse 80  Temp(Src) 97.4 F (36.3 C) (Oral)  Wt 246 lb (111.585 kg)  BMI 31.57 kg/m2    Objective:   Physical Exam  Constitutional: He is oriented to person, place, and time. He appears well-developed and well-nourished. No distress.  HENT:  Head: Normocephalic and atraumatic.  Neck: Neck supple.  Cardiovascular: Normal rate, regular rhythm and normal heart sounds.   No murmur heard. Pulmonary/Chest: Effort normal and breath sounds normal. He has no wheezes.  Musculoskeletal: He exhibits no edema.  Neurological: He is alert and oriented to person, place, and time. No cranial nerve deficit.  Skin: Skin is warm and dry.  Psychiatric: He has a normal mood and affect. His behavior is normal.          Assessment & Plan:

## 2012-11-03 NOTE — Assessment & Plan Note (Signed)
Maintain same dose of Wellbutrin.

## 2012-11-03 NOTE — Assessment & Plan Note (Signed)
Worsening blood sugar control.  It is unclear whether episodes of "agitation" exacerbated by hypoglycemia.  He is non compliant with checking blood sugars.  He may benefit from continuous blood sugar monitoring.  Refer to Dr. Elvera Lennox.

## 2012-11-05 DIAGNOSIS — M5030 Other cervical disc degeneration, unspecified cervical region: Secondary | ICD-10-CM | POA: Diagnosis not present

## 2012-11-05 DIAGNOSIS — M542 Cervicalgia: Secondary | ICD-10-CM | POA: Diagnosis not present

## 2012-12-04 ENCOUNTER — Encounter: Payer: Self-pay | Admitting: Cardiology

## 2012-12-04 ENCOUNTER — Ambulatory Visit (INDEPENDENT_AMBULATORY_CARE_PROVIDER_SITE_OTHER): Payer: Medicare Other | Admitting: Cardiology

## 2012-12-04 VITALS — BP 124/68 | HR 57 | Ht 74.0 in | Wt 240.8 lb

## 2012-12-04 DIAGNOSIS — E785 Hyperlipidemia, unspecified: Secondary | ICD-10-CM

## 2012-12-04 DIAGNOSIS — I10 Essential (primary) hypertension: Secondary | ICD-10-CM | POA: Diagnosis not present

## 2012-12-04 DIAGNOSIS — I2510 Atherosclerotic heart disease of native coronary artery without angina pectoris: Secondary | ICD-10-CM

## 2012-12-04 DIAGNOSIS — R079 Chest pain, unspecified: Secondary | ICD-10-CM | POA: Diagnosis not present

## 2012-12-04 NOTE — Patient Instructions (Addendum)
Your physician wants you to follow-up in: ONE YEAR WITH DR CRENSHAW You will receive a reminder letter in the mail two months in advance. If you don't receive a letter, please call our office to schedule the follow-up appointment.  

## 2012-12-04 NOTE — Progress Notes (Signed)
HPI: Pleasant male with past medical history of coronary artery disease status post coronary artery bypass graft for fu. Note previous carotid Dopplers in October of 2001 showed no significant stenosis. Previous abdominal ultrasound in 2003 showed no aneurysm. A previous BNP was normal at 31. A Myoview in March of 2011 revealed an ejection fraction of 47% and inferior ischemia. The RV was prominent. An echocardiogram in March of 2011 revealed normal LV function and mild left atrial enlargement. A cardiac catheterization in May of 2011 revealed triple-vessel coronary artery disease status post three-vessel CABG with 3/3 patent grafts. Severe stenosis in the proximal and distal body of the saphenous vein graft to the left-sided PDA. Normal left ventricular systolic function. Patient had PCI with a drug-eluting stent at that time. Had repeat cardiac cath in Dec of 2011 for dyspnea and f/u abnormal myoview. This revealed an EF of 50. Continued patency internal mammary to the LAD. Occluded saphenous vein graft to the recent percutaneous intervention to the distal circumflex/PDA territory. Progressive disease in the saphenous vein graft to the OM. Patient evaluated by CVTS. Distal vessels felt to be poor targets. CABG could be considered in the future if PCI options were exhausted. Patient had PCI of the saphenous vein graft to the obtuse marginal. I last saw him Jan 2013. Since then, he was admitted to a hospital in [facility] with neck and shoulder pain. He had a nuclear study and apparently a cardiac catheterization was recommended. However after obtaining records from here this was canceled. Apparently the images were not different from her previous images. His pain was felt to be musculoskeletal. He is not having exertional chest pain. He has some dyspnea on exertion which is unchanged. No orthopnea, PND or pedal edema.   Current Outpatient Prescriptions  Medication Sig Dispense Refill  . aspirin 325 MG EC  tablet Take 325 mg by mouth daily.        [name] atorvastatin (LIPITOR) 80 MG tablet Take 1 tablet (80 mg total) by mouth daily.  90 tablet  3  . buPROPion (WELLBUTRIN XL) 300 MG 24 hr tablet TAKE 1 TABLET BY MOUTH EVERY DAY  90 tablet  1  . BYSTOLIC 5 MG tablet TAKE 1 TABLET EVERY DAY  90 tablet  2  . gabapentin (NEURONTIN) 300 MG capsule Take 1 capsule (300 mg total) by mouth at bedtime as needed. For neuropathy.  90 capsule  3  . glucose blood (ACCU-CHEK AVIVA) test strip Use to check blood sugar 5 times a day as instructed       . insulin glargine (LANTUS SOLOSTAR) 100 UNIT/ML injection Inject 22 units in the evening subcutaneously.  45 mL  3  . insulin glulisine (APIDRA SOLOSTAR) 100 UNIT/ML injection Inject 15-30 Units into the skin 3 (three) times daily before meals.  45 mL  3  . Insulin Pen Needle (PEN NEEDLES 31GX5/16") 31G X 8 MM MISC Use to inject insulin 5 times a day.  100 each  11  . Lancets (ACCU-CHEK MULTICLIX) lancets by Other route. Use up to 5 times a day to check blood sugar.       . lisinopril-hydrochlorothiazide (PRINZIDE,ZESTORETIC) 20-12.5 MG per tablet TAKE 1 TABLET BY MOUTH DAILY  90 tablet  1  . metFORMIN (GLUCOPHAGE) 1000 MG tablet Take 1 tablet (1,000 mg total) by mouth 2 (two) times daily with a meal.  180 tablet  1  . omeprazole (PRILOSEC) 40 MG capsule TAKE ONE CAPSULE BY MOUTH DAILY  90 capsule  1  . polyethylene glycol (MIRALAX / GLYCOLAX) packet Take 17 g by mouth daily. 17 grams mixed with water two times a day.        No current facility-administered medications for this visit.     Past Medical History  Diagnosis Date  . Depression     type II, uncontrolled  . Hypertension   . Hyperlipidemia   . GERD (gastroesophageal reflux disease)   . CAD (coronary artery disease)   . Osteoarthritis   . Postherpetic neuralgia   . Herpes zoster   . Adjustment disorder with mixed emotional features   . Diabetic peripheral neuropathy   . Erectile dysfunction   .  Obesity     Past Surgical History  Procedure Laterality Date  . Coronary artery bypass graft  1996    eith a LIMA to the LAD, saphenous vein graft to the acute marginal, saphenous vein graft to the PDA and saphenous vein graft to the circumflex.  . Knee arthroscopy  05/2004    right knee  . Replacement total knee bilateral    . Shoulder surgery      History   Social History  . Marital Status: Married    Spouse Name: N/A    Number of Children: N/A  . Years of Education: N/A   Occupational History  . Retired Naval architect    Social History Main Topics  . Smoking status: Never Smoker   . Smokeless tobacco: Not on file  . Alcohol Use: Yes  . Drug Use: Not on file  . Sexual Activity: Not on file   Other Topics Concern  . Not on file   Social History Narrative   married    ROS: no fevers or chills, productive cough, hemoptysis, dysphasia, odynophagia, melena, hematochezia, dysuria, hematuria, rash, seizure activity, orthopnea, PND, pedal edema, claudication. Remaining systems are negative.  Physical Exam: Well-developed well-nourished in no acute distress.  Skin is warm and dry.  HEENT is normal.  Neck is supple.  Chest is clear to auscultation with normal expansion.  Cardiovascular exam is regular rate and rhythm.  Abdominal exam nontender or distended. No masses palpated. Extremities show no edema. neuro grossly intact  ECG sinus rhythm at a rate of 57. Nonspecific ST changes. Occasional PAC.

## 2012-12-04 NOTE — Assessment & Plan Note (Signed)
Continue aspirin and statin. He has some dyspnea which is unchanged. I am not convinced this is completely cardiac related. Redo surgery would be at increased risk. Plan medical therapy for now. We will obtain results from his recent nuclear study in [place].

## 2012-12-04 NOTE — Assessment & Plan Note (Signed)
Continue statin. 

## 2012-12-04 NOTE — Assessment & Plan Note (Signed)
Blood pressure controlled. Continue present medications. 

## 2012-12-08 ENCOUNTER — Ambulatory Visit (INDEPENDENT_AMBULATORY_CARE_PROVIDER_SITE_OTHER): Payer: Medicare Other | Admitting: Internal Medicine

## 2012-12-08 ENCOUNTER — Encounter: Payer: Self-pay | Admitting: Internal Medicine

## 2012-12-08 VITALS — BP 122/62 | HR 68 | Temp 98.1°F | Resp 12 | Ht 74.5 in | Wt 240.0 lb

## 2012-12-08 DIAGNOSIS — E1142 Type 2 diabetes mellitus with diabetic polyneuropathy: Secondary | ICD-10-CM

## 2012-12-08 DIAGNOSIS — E1149 Type 2 diabetes mellitus with other diabetic neurological complication: Secondary | ICD-10-CM

## 2012-12-08 NOTE — Patient Instructions (Addendum)
Please try to take the insulin every time.  Please check sugars at least 3x a day, and write them down.  Try to limit snacking at night.  Continue Lantus at 22 units daily. Take Apidra as follows: - 15 units with a smaller meal - 20 units for a medium meal - 25 units for a larger meal.    PATIENT INSTRUCTIONS FOR TYPE 2 DIABETES:  **Please join MyChart!** - see attached instructions about how to join   DIET AND EXERCISE Diet and exercise is an important part of diabetic treatment.  We recommended aerobic exercise in the form of brisk walking (working between 40-60% of maximal aerobic capacity, similar to brisk walking) for 150 minutes per week (such as 30 minutes five days per week) along with 3 times per week performing 'resistance' training (using various gauge rubber tubes with handles) 5-10 exercises involving the major muscle groups (upper body, lower body and core) performing 10-15 repetitions (or near fatigue) each exercise. Start at half the above goal but build slowly to reach the above goals. If limited by weight, joint pain, or disability, we recommend daily walking in a swimming pool with water up to waist to reduce pressure from joints while allow for adequate exercise.    BLOOD GLUCOSES Monitoring your blood glucoses is important for continued management of your diabetes. Please check your blood glucoses 2-4 times a day: fasting, before meals and at bedtime (you can rotate these measurements - e.g. one day check before the 3 meals, the next day check before 2 of the meals and before bedtime, etc.   HYPOGLYCEMIA (low blood sugar) Hypoglycemia is usually a reaction to not eating, exercising, or taking too much insulin/ other diabetes drugs.  Symptoms include tremors, sweating, hunger, confusion, headache, etc. Treat IMMEDIATELY with 15 grams of Carbs:   4 glucose tablets    cup regular juice/soda   2 tablespoons raisins   4 teaspoons sugar   1 tablespoon honey Recheck  blood glucose in 15 mins and repeat above if still symptomatic/blood glucose <100. Please contact our office at [phone] if you have questions about how to next handle your insulin.  RECOMMENDATIONS TO REDUCE YOUR RISK OF DIABETIC COMPLICATIONS: * Take your prescribed MEDICATION(S). * Follow a DIABETIC diet: Complex carbs, fiber rich foods, heart healthy fish twice weekly, (monounsaturated and polyunsaturated) fats * AVOID saturated/trans fats, high fat foods, >2,300 mg salt per day. * EXERCISE at least 5 times a week for 30 minutes or preferably daily.  * DO NOT SMOKE OR DRINK more than 1 drink a day. * Check your FEET every day. Do not wear tightfitting shoes. Contact us if you develop an ulcer * See your EYE doctor once a year or more if needed * Get a FLU shot once a year * Get a PNEUMONIA vaccine once before and once after age 30 years  GOALS:  * Your Hemoglobin A1c of <7%  * fasting sugars need to be <130 * after meals sugars need to be <180 (2h after you start eating) * Your Systolic BP should be 140 or lower  * Your Diastolic BP should be 80 or lower  * Your HDL (Good Cholesterol) should be 40 or higher  * Your LDL (Bad Cholesterol) should be 100 or lower  * Your Triglycerides should be 150 or lower  * Your Urine microalbumin (kidney function) should be <30 * Your Body Mass Index should be 25 or lower   We will be glad to help  you achieve these goals. Our telephone number is: [phone].

## 2012-12-08 NOTE — Progress Notes (Signed)
Patient ID: Luis Yu, male   DOB: 15-Sep-1945, 67 y.o.   MRN: [id number]  HPI: Luis Yu is a 67 y.o.-year-old male, referred by his PCP, Dr. Artist Pais, for management of DM2, insulin-dependent, uncontrolled, with  Complications (CAD - s/p [id number], PN, ED). He is here with his wife, who offers most of the history.  Patient has been diagnosed with diabetes in 2004; he started insulin 2009. Last hemoglobin A1c was: Lab Results  Component Value Date   HGBA1C 8.9* 10/29/2012  Before that, 8.7%.  Pt is on a regimen of: - Metformin 1000 mg po bid - Lantus 22 units qhs - never misses this - pens - Apidra 15-30 units tid ac - takes it after he eats!. Dose depends on size of the meal. He might miss 5-6x a week - pens  Pt checks his sugars 3x a day and they are: - am: 140-190 - 2h after b'fast: 170-180 - before dinner: 140-150 - bedtime: 180-190 Has occasional lows. Lowest sugar was 60s - mostly in pm - when skips a meal; he has hypoglycemia awareness at 100. Highest sugar was 600 in June when in hospital in [place]. Highest 270 after this.  Pt's meals are: - Breakfast: egg sandwich + coffee with cream and sugar; sometimes grits, oatmeal, cornflakes + 2% milk - Lunch: may skip or have a sandwich - Dinner: meat + potatoes or rice + vegetables/salads/corn - Snacks: cheese crackers, cake - grazes especially at night, he HAS to eat constantly when he watches TV, mostly sweats.  Usually has larger 2 meals a day. No sodas. Drinks diet juice/tea. Sometimes drinks OJ.   - mild CKD, last BUN/creatinine:  Lab Results  Component Value Date   BUN 20 10/29/2012   CREATININE 1.1 10/29/2012  he is on lisinopril - last set of lipids: Lab Results  Component Value Date   CHOL 149 10/29/2012   HDL 48.30 10/29/2012   LDLCALC 81 10/29/2012   TRIG 100.0 10/29/2012   CHOLHDL 3 10/29/2012  he is on atorvastatin. - last eye exam was in 08/2012. + DR OU. (Dr Dione Booze) - no numbness and tingling in his feet, but  has burning pain at night (mostly toes) He is on aspirin 325. I reviewed his chart and he also has a history of hypertension, hyperlipidemia, osteoarthritis (had bilateral knee replacement), GERD, depression, obesity, OSA - does not wear CPAP, history of herpes zoster with postherpetic neuralgia.  Pt has FH of DM in [place].   ROS: Constitutional: no weight gain/loss, + fatigue, no subjective hyperthermia/hypothermia, nocturia x6, frequent urination Eyes: no blurry vision, no xerophthalmia ENT: no sore throat, no nodules palpated in throat, no dysphagia/odynophagia, no hoarseness Cardiovascular: no CP/SOB/palpitations/leg swelling Respiratory: no cough/SOB Gastrointestinal: no N/V/D/C Musculoskeletal: no muscle/joint aches Skin: no rashes Neurological: no tremors/numbness/tingling/dizziness Psychiatric: no depression/anxiety  Past Medical History  Diagnosis Date  . Depression     type II, uncontrolled  . Hypertension   . Hyperlipidemia   . GERD (gastroesophageal reflux disease)   . CAD (coronary artery disease)   . Osteoarthritis   . Postherpetic neuralgia   . Herpes zoster   . Adjustment disorder with mixed emotional features   . Diabetic peripheral neuropathy   . Erectile dysfunction   . Obesity     Past Surgical History  Procedure Laterality Date  . Coronary artery bypass graft  1996    eith a LIMA to the LAD, saphenous vein graft to the acute marginal, saphenous vein graft to  the PDA and saphenous vein graft to the circumflex.  . Knee arthroscopy  05/2004    right knee  . Replacement total knee bilateral    . Shoulder surgery      History   Social History  . Marital Status: Married    Spouse Name: N/A    Number of Children: 1   Occupational History  . Retired Naval architect    Social History Main Topics  . Smoking status: Never Smoker   . Smokeless tobacco: Not on file  . Alcohol Use: No  . Drug Use: No  . Sexual Activity: Yes    Partners: Female   Social  History Narrative   Married   Regular exercise: none   Caffeine use: cup of coffee daily         Current Outpatient Prescriptions on File Prior to Visit  Medication Sig Dispense Refill  . aspirin 325 MG EC tablet Take 325 mg by mouth daily.        [name] atorvastatin (LIPITOR) 80 MG tablet Take 1 tablet (80 mg total) by mouth daily.  90 tablet  3  . buPROPion (WELLBUTRIN XL) 300 MG 24 hr tablet TAKE 1 TABLET BY MOUTH EVERY DAY  90 tablet  1  . BYSTOLIC 5 MG tablet TAKE 1 TABLET EVERY DAY  90 tablet  2  . gabapentin (NEURONTIN) 300 MG capsule Take 1 capsule (300 mg total) by mouth at bedtime as needed. For neuropathy.  90 capsule  3  . glucose blood (ACCU-CHEK AVIVA) test strip Use to check blood sugar 5 times a day as instructed       . insulin glargine (LANTUS SOLOSTAR) 100 UNIT/ML injection Inject 22 units in the evening subcutaneously.  45 mL  3  . insulin glulisine (APIDRA SOLOSTAR) 100 UNIT/ML injection Inject 15-30 Units into the skin 3 (three) times daily before meals.  45 mL  3  . Insulin Pen Needle (PEN NEEDLES 31GX5/16") 31G X 8 MM MISC Use to inject insulin 5 times a day.  100 each  11  . Lancets (ACCU-CHEK MULTICLIX) lancets by Other route. Use up to 5 times a day to check blood sugar.       . lisinopril-hydrochlorothiazide (PRINZIDE,ZESTORETIC) 20-12.5 MG per tablet TAKE 1 TABLET BY MOUTH DAILY  90 tablet  1  . metFORMIN (GLUCOPHAGE) 1000 MG tablet Take 1 tablet (1,000 mg total) by mouth 2 (two) times daily with a meal.  180 tablet  1  . omeprazole (PRILOSEC) 40 MG capsule TAKE ONE CAPSULE BY MOUTH DAILY  90 capsule  1  . polyethylene glycol (MIRALAX / GLYCOLAX) packet Take 17 g by mouth daily. 17 grams mixed with water two times a day.        No current facility-administered medications on file prior to visit.   No Known Allergies  Family History  Problem Relation Age of Onset  . Coronary artery disease Mother     deceased at 28  . Coronary artery disease Father     alive   . Stroke      half brother  . Stroke Brother    PE: BP 122/62  Pulse 68  Temp(Src) 98.1 F (36.7 C) (Oral)  Resp 12  Ht 6' 2.5" (1.892 m)  Wt 240 lb (108.863 kg)  BMI 30.41 kg/m2  SpO2 96% Wt Readings from Last 3 Encounters:  12/08/12 240 lb (108.863 kg)  12/04/12 240 lb 12.8 oz (109.226 kg)  11/03/12 246 lb (111.585 kg)  Constitutional: overweight, in NAD Eyes: PERRLA, EOMI, no exophthalmos ENT: moist mucous membranes, no thyromegaly, no cervical lymphadenopathy Cardiovascular: RRR, No MRG Respiratory: CTA B Gastrointestinal: abdomen soft, NT, ND, BS+ Musculoskeletal: no deformities, strength intact in all 4 Skin: moist, warm, no rashes Neurological: no tremor with outstretched hands, DTR normal in all 4  ASSESSMENT: 1. DM2, insulin-dependent, uncontrolled, with complications - CAD, status post CABG in 1996, status post PCI, with restenosis - Dr. Jens Som - Peripheral neuropathy - ED  PLAN:  1. Patient with long-standing, uncontrolled diabetes, on insulin regimen, but noncompliant with the regimen (rapid-acting insulin) as he forgets to take it ~0% time. - We discussed about options for treatment, and I suggested to:   Inject the rapid acting insulin 15 minutes before a meal, not after a meal  Inject only in the abdomen, rotating checks (now he rotates checks in abd and thighs  Keep the needle in for 10 seconds after injecting the last unit of insulin  Limit his snacks and grazing at night  Check sugars at least 3 times a day and write them down, pt the list on the fridge, and make a sign if he actually injected insulin with that meal  keep the Lantus at 22 units at night  Inject 15 units of Apidra with a small meal, 20 units with a medium meal and 25 units with a large meal  Return to see me in a month with a sugar log - given sugar log and advised how to fill it and to bring it at next appt  - given foot care handout and explained the principles  - given  instructions for hypoglycemia management "15-15 rule"  - advised for yearly eye exams ( he is up-to-date on this)

## 2012-12-10 ENCOUNTER — Other Ambulatory Visit: Payer: Self-pay | Admitting: Internal Medicine

## 2012-12-10 DIAGNOSIS — M79609 Pain in unspecified limb: Secondary | ICD-10-CM | POA: Diagnosis not present

## 2012-12-10 DIAGNOSIS — E1149 Type 2 diabetes mellitus with other diabetic neurological complication: Secondary | ICD-10-CM | POA: Diagnosis not present

## 2012-12-10 DIAGNOSIS — B351 Tinea unguium: Secondary | ICD-10-CM | POA: Diagnosis not present

## 2013-01-05 ENCOUNTER — Ambulatory Visit: Payer: Medicare Other | Admitting: Internal Medicine

## 2013-01-08 ENCOUNTER — Ambulatory Visit: Payer: Medicare Other | Admitting: Internal Medicine

## 2013-02-06 ENCOUNTER — Ambulatory Visit: Payer: Medicare Other | Admitting: Internal Medicine

## 2013-02-09 ENCOUNTER — Encounter: Payer: Self-pay | Admitting: Internal Medicine

## 2013-02-09 ENCOUNTER — Ambulatory Visit (INDEPENDENT_AMBULATORY_CARE_PROVIDER_SITE_OTHER): Payer: Medicare Other | Admitting: Internal Medicine

## 2013-02-09 VITALS — BP 130/70 | HR 80 | Temp 98.4°F | Ht 74.5 in | Wt 245.0 lb

## 2013-02-09 DIAGNOSIS — R270 Ataxia, unspecified: Secondary | ICD-10-CM

## 2013-02-09 DIAGNOSIS — E1149 Type 2 diabetes mellitus with other diabetic neurological complication: Secondary | ICD-10-CM

## 2013-02-09 DIAGNOSIS — R269 Unspecified abnormalities of gait and mobility: Secondary | ICD-10-CM | POA: Insufficient documentation

## 2013-02-09 DIAGNOSIS — R279 Unspecified lack of coordination: Secondary | ICD-10-CM

## 2013-02-09 DIAGNOSIS — Z23 Encounter for immunization: Secondary | ICD-10-CM | POA: Diagnosis not present

## 2013-02-09 DIAGNOSIS — I10 Essential (primary) hypertension: Secondary | ICD-10-CM | POA: Diagnosis not present

## 2013-02-09 DIAGNOSIS — E1142 Type 2 diabetes mellitus with diabetic polyneuropathy: Secondary | ICD-10-CM | POA: Diagnosis not present

## 2013-02-09 MED ORDER — INSULIN GLULISINE 100 UNIT/ML SOLOSTAR PEN: 15.0000 [IU] | PEN_INJECTOR | Freq: Three times a day (TID) | SUBCUTANEOUS | Status: DC

## 2013-02-09 MED ORDER — METFORMIN HCL 1000 MG PO TABS: 1000.0000 mg | ORAL_TABLET | Freq: Two times a day (BID) | ORAL | Status: DC

## 2013-02-09 MED ORDER — ATORVASTATIN CALCIUM 80 MG PO TABS: ORAL_TABLET | ORAL | Status: DC

## 2013-02-09 MED ORDER — LISINOPRIL-HYDROCHLOROTHIAZIDE 20-12.5 MG PO TABS: ORAL_TABLET | ORAL | Status: DC

## 2013-02-09 MED ORDER — NEBIVOLOL HCL 5 MG PO TABS: ORAL_TABLET | ORAL | Status: DC

## 2013-02-09 MED ORDER — OMEPRAZOLE 40 MG PO CPDR: 40.0000 mg | DELAYED_RELEASE_CAPSULE | Freq: Every day | ORAL | Status: DC

## 2013-02-09 MED ORDER — INSULIN GLARGINE 100 UNIT/ML ~~LOC~~ SOLN: SUBCUTANEOUS | Status: DC

## 2013-02-09 MED ORDER — BUPROPION HCL ER (XL) 300 MG PO TB24: ORAL_TABLET | ORAL | Status: DC

## 2013-02-09 NOTE — Progress Notes (Signed)
Subjective:    Patient ID: Luis Yu, male    DOB: [birth year], 67 y.o.   MRN: [id number]  HPI  67 year old white male with hx of uncontrolled type II diabetes, hypertension, and CAD for follow up.   Patient was seen by endocrinologist.  Patient found 25 units of Apidra not enough for large meal.  He is using 30 units.  He denies hypoglycemia.  He is accompanied by supportive wife.  She reports his gait is progressively getting worse.  He has had occasional falls.  She says he has difficulty with stairs and has shuffling gait.  He has mild tremor.  He feels unsteady.  He last saw neurologist 3-4 years ago when he had severe case of facial shingles.  MRI of Brain was completed 2010 - interpretation by [facility].  Hypertension - stable.   Review of Systems    negative for chest pain or shortness of breath.  Occasional urinary incontinence. Past Medical History  Diagnosis Date  . Depression     type II, uncontrolled  . Hypertension   . Hyperlipidemia   . GERD (gastroesophageal reflux disease)   . CAD (coronary artery disease)   . Osteoarthritis   . Postherpetic neuralgia   . Herpes zoster   . Adjustment disorder with mixed emotional features   . Diabetic peripheral neuropathy   . Erectile dysfunction   . Obesity     History   Social History  . Marital Status: Married    Spouse Name: N/A    Number of Children: N/A  . Years of Education: N/A   Occupational History  . Retired Naval architect    Social History Main Topics  . Smoking status: Never Smoker   . Smokeless tobacco: Not on file  . Alcohol Use: No  . Drug Use: No  . Sexual Activity: Yes    Partners: Female   Other Topics Concern  . Not on file   Social History Narrative   Married   Regular exercise: none   Caffeine use: cup of coffee daily          Past Surgical History  Procedure Laterality Date  . Coronary artery bypass graft  1996    eith a LIMA to the LAD, saphenous vein graft to the acute marginal,  saphenous vein graft to the PDA and saphenous vein graft to the circumflex.  . Knee arthroscopy  05/2004    right knee  . Replacement total knee bilateral    . Shoulder surgery      Family History  Problem Relation Age of Onset  . Coronary artery disease Mother     deceased at 77  . Coronary artery disease Father     alive  . Stroke      half brother  . Stroke Brother     No Known Allergies  Current Outpatient Prescriptions on File Prior to Visit  Medication Sig Dispense Refill  . aspirin 325 MG EC tablet Take 325 mg by mouth daily.        [name] gabapentin (NEURONTIN) 300 MG capsule Take 1 capsule (300 mg total) by mouth at bedtime as needed. For neuropathy.  90 capsule  3  . glucose blood (ACCU-CHEK AVIVA) test strip Use to check blood sugar 5 times a day as instructed       . Insulin Pen Needle (PEN NEEDLES 31GX5/16") 31G X 8 MM MISC Use to inject insulin 5 times a day.  100 each  11  .  Lancets (ACCU-CHEK MULTICLIX) lancets by Other route. Use up to 5 times a day to check blood sugar.       . polyethylene glycol (MIRALAX / GLYCOLAX) packet Take 17 g by mouth daily. 17 grams mixed with water two times a day.        No current facility-administered medications on file prior to visit.    BP 150/90  Pulse 80  Temp(Src) 98.4 F (36.9 C) (Oral)  Ht 6' 2.5" (1.892 m)  Wt 245 lb (111.131 kg)  BMI 31.05 kg/m2    Objective:   Physical Exam  Constitutional: He is oriented to person, place, and time. He appears well-developed and well-nourished. No distress.  HENT:  Head: Normocephalic and atraumatic.  Right Ear: External ear normal.  Left cerumen impaction  Neck: Neck supple.  No carotid bruit  Cardiovascular: Normal rate, regular rhythm and normal heart sounds.   Pulmonary/Chest: Effort normal and breath sounds normal. He has no wheezes.  Musculoskeletal: He exhibits no edema.  Neurological: He is alert and oriented to person, place, and time. He displays normal reflexes. No  cranial nerve deficit. He exhibits abnormal muscle tone.  Dysmetria.  No pronator drift.  Positive romberg. Decreased sensation to temperature and vibration of feet. Unsteady gait  Skin: Skin is warm and dry.  Psychiatric: He has a normal mood and affect. His behavior is normal.          Assessment & Plan:

## 2013-02-09 NOTE — Assessment & Plan Note (Signed)
Stable.  No change in medication regimen. 

## 2013-02-09 NOTE — Assessment & Plan Note (Addendum)
Increase mealtime Apidra dosing.  Follow up with Dr. Elvera Lennox. Screen for hemochromatosis.

## 2013-02-09 NOTE — Assessment & Plan Note (Signed)
He has unsteady gait.  Question cogwheel rigidity.  Repeat MRI of Brain.  He is also experiencing intermittent urinary incontinence.  Consider NPH.  Refer to Dr. Allena Katz for further evaluation.  Please TSH and B12 normal.  New onset Parkinson's is another consideration  Check ceruloplasmin.

## 2013-02-09 NOTE — Progress Notes (Signed)
Pre visit review using our clinic review tool, if applicable. No additional management support is needed unless otherwise documented below in the visit note. 

## 2013-02-10 ENCOUNTER — Other Ambulatory Visit: Payer: Medicare Other

## 2013-02-10 DIAGNOSIS — R279 Unspecified lack of coordination: Secondary | ICD-10-CM | POA: Diagnosis not present

## 2013-02-10 LAB — BASIC METABOLIC PANEL
BUN: 21 mg/dL (ref 6–23)
CO2: 27 mEq/L (ref 19–32)
Calcium: 9 mg/dL (ref 8.4–10.5)
Chloride: 104 mEq/L (ref 96–112)
Creatinine, Ser: 1.1 mg/dL (ref 0.4–1.5)
GFR: 67.76 mL/min (ref >60.00)
Glucose, Bld: 176 mg/dL — ABNORMAL HIGH (ref 70–99)
Potassium: 3.6 mEq/L (ref 3.5–5.1)
Sodium: 140 mEq/L (ref 135–145)

## 2013-02-10 LAB — MICROALBUMIN / CREATININE URINE RATIO
Creatinine,U: 276.1 mg/dL
Microalb Creat Ratio: 2.2 mg/g (ref 0.0–30.0)
Microalb, Ur: 6.2 mg/dL — ABNORMAL HIGH (ref 0.0–1.9)

## 2013-02-10 LAB — FERRITIN: Ferritin: 28.5 ng/mL (ref 22.0–322.0)

## 2013-02-10 LAB — IBC PANEL
Iron: 62 ug/dL (ref 42–165)
Saturation Ratios: 19.5 % — ABNORMAL LOW (ref 20.0–50.0)
Transferrin: 227.4 mg/dL (ref 212.0–360.0)

## 2013-02-10 LAB — HEMOGLOBIN A1C: Hgb A1c MFr Bld: 8.5 % — ABNORMAL HIGH (ref 4.6–6.5)

## 2013-02-11 LAB — CERULOPLASMIN: Ceruloplasmin: 27 mg/dL (ref 20–60)

## 2013-02-16 ENCOUNTER — Ambulatory Visit
Admission: RE | Admit: 2013-02-16 | Discharge: 2013-02-16 | Disposition: A | Discharge status: Home / Self Care | Payer: Medicare Other | Source: Ambulatory Visit | Attending: Internal Medicine | Admitting: Internal Medicine

## 2013-02-16 DIAGNOSIS — R269 Unspecified abnormalities of gait and mobility: Secondary | ICD-10-CM | POA: Diagnosis not present

## 2013-02-16 DIAGNOSIS — R270 Ataxia, unspecified: Secondary | ICD-10-CM

## 2013-02-18 DIAGNOSIS — R05 Cough: Secondary | ICD-10-CM | POA: Diagnosis not present

## 2013-02-18 DIAGNOSIS — R52 Pain, unspecified: Secondary | ICD-10-CM | POA: Diagnosis not present

## 2013-02-18 DIAGNOSIS — J329 Chronic sinusitis, unspecified: Secondary | ICD-10-CM | POA: Diagnosis not present

## 2013-02-18 DIAGNOSIS — J40 Bronchitis, not specified as acute or chronic: Secondary | ICD-10-CM | POA: Diagnosis not present

## 2013-02-18 DIAGNOSIS — R079 Chest pain, unspecified: Secondary | ICD-10-CM | POA: Diagnosis not present

## 2013-02-20 NOTE — Addendum Note (Signed)
Addended by: Florene Glen on: 02/20/2013 03:15 PM   Modules accepted: Orders

## 2013-02-23 ENCOUNTER — Encounter: Payer: Self-pay | Admitting: Neurology

## 2013-02-24 ENCOUNTER — Other Ambulatory Visit: Payer: Self-pay | Admitting: *Deleted

## 2013-02-24 ENCOUNTER — Encounter: Payer: Self-pay | Admitting: Neurology

## 2013-02-24 ENCOUNTER — Ambulatory Visit (INDEPENDENT_AMBULATORY_CARE_PROVIDER_SITE_OTHER): Payer: Medicare Other | Admitting: Neurology

## 2013-02-24 VITALS — BP 139/76 | HR 70 | Ht 74.0 in | Wt 244.0 lb

## 2013-02-24 DIAGNOSIS — G609 Hereditary and idiopathic neuropathy, unspecified: Secondary | ICD-10-CM

## 2013-02-24 DIAGNOSIS — M545 Low back pain, unspecified: Secondary | ICD-10-CM

## 2013-02-24 MED ORDER — GLUCOSE BLOOD VI STRP: ORAL_STRIP | Status: DC

## 2013-02-24 NOTE — Progress Notes (Signed)
[facility]  PATIENT: Luis Yu DOB: May 21, 1945  HISTORICAL  Luis Yu is a 67 years old right-handed Caucasian male, accompanied by his wife, referred by his primary care physician Dr. Donato Yu evaluation of mild gait difficulty, frequent falling  He had a past medical history of hypertension, hyperlipidemia, coronary artery disease, diabetes, which is under suboptimal control, most recent  A1c is 8.5, he is a retired [employer] architect since 2625, has been very sedentary, sit down playing cards, watching TV most of the time,  I saw him previously in 2011 for right V1 shingles, which has much improved  Over the past few years, he has been complaining bilateral feet numbness and tingling, getting worse over the past one year, recent few months, he had been falling a few times, he also had a history of bilateral knee replacement, he seems to feel right leg give out underneath him more often.  he also complains of low back pain, occasionally radiating pain to his right leg, he denies bowel and bladder incontinence.    REVIEW OF SYSTEMS: Full 14 system review of systems performed and notable only for loss, tremor, decreased We have reviewed MRI of the brain together, which has demonstrates periventricular white matter disease, no acute lesions, mild atrophy,energy, gait difficulty  ALLERGIES: No Known Allergies  HOME MEDICATIONS: Outpatient Prescriptions Prior to Visit  Medication Sig Dispense Refill  . aspirin 325 MG EC tablet Take 325 mg by mouth daily.        [name] atorvastatin (LIPITOR) 80 MG tablet TAKE 1 TABLET BY MOUTH EVERY DAY  90 tablet  1  . buPROPion (WELLBUTRIN XL) 300 MG 24 hr tablet TAKE 1 TABLET BY MOUTH EVERY DAY  90 tablet  1  . Cyanocobalamin (B-12) 5000 MCG SUBL Place 1 tablet under the tongue once a week.      . gabapentin (NEURONTIN) 300 MG capsule Take 1 capsule (300 mg total) by mouth at bedtime as needed. For neuropathy.  90 capsule  3  . glucose blood  (ACCU-CHEK AVIVA) test strip Use to check blood sugar 5 times a day as instructed       . insulin glargine (LANTUS) 100 UNIT/ML injection Inject 22 units in the evening subcutaneously.  45 mL  3  . Insulin Glulisine (APIDRA SOLOSTAR) 100 UNIT/ML SOPN Inject 15-30 Units into the skin 3 (three) times daily.  3 mL  3  . Insulin Pen Needle (PEN NEEDLES 31GX5/16") 31G X 8 MM MISC Use to inject insulin 5 times a day.  100 each  11  . Lancets (ACCU-CHEK MULTICLIX) lancets by Other route. Use up to 5 times a day to check blood sugar.       . lisinopril-hydrochlorothiazide (PRINZIDE,ZESTORETIC) 20-12.5 MG per tablet TAKE 1 TABLET BY MOUTH DAILY  90 tablet  1  . metFORMIN (GLUCOPHAGE) 1000 MG tablet Take 1 tablet (1,000 mg total) by mouth 2 (two) times daily with a meal.  180 tablet  1  . nebivolol (BYSTOLIC) 5 MG tablet TAKE 1 TABLET EVERY DAY  90 tablet  1  . omeprazole (PRILOSEC) 40 MG capsule Take 1 capsule (40 mg total) by mouth daily.  90 capsule  1  . polyethylene glycol (MIRALAX / GLYCOLAX) packet Take 17 g by mouth daily. 17 grams mixed with water two times a day.        No facility-administered medications prior to visit.    PAST MEDICAL HISTORY: Past Medical History  Diagnosis Date  .  Depression     type II, uncontrolled  . Hypertension   . Hyperlipidemia   . GERD (gastroesophageal reflux disease)   . CAD (coronary artery disease)   . Osteoarthritis   . Postherpetic neuralgia   . Herpes zoster   . Adjustment disorder with mixed emotional features   . Diabetic peripheral neuropathy   . Erectile dysfunction   . Obesity     PAST SURGICAL HISTORY: Past Surgical History  Procedure Laterality Date  . Coronary artery bypass graft  1996    eith a LIMA to the LAD, saphenous vein graft to the acute marginal, saphenous vein graft to the PDA and saphenous vein graft to the circumflex.  . Knee arthroscopy  05/2004    right knee  . Replacement total knee bilateral    . Shoulder surgery  right      FAMILY HISTORY: Family History  Problem Relation Age of Onset  . Coronary artery disease Mother     deceased at 60  . Coronary artery disease Father     alive  . Stroke      half brother  . Stroke Brother     SOCIAL HISTORY:  History   Social History  . Marital Status: Married    Spouse Name: Luis Yu    Number of Children: 1  . Years of Education: 13   Occupational History  . Retired Naval architect    Social History Main Topics  . Smoking status: Never Smoker   . Smokeless tobacco: Never Used  . Alcohol Use: 1.0 oz/week    2 drink(s) per week     Comment: twice a month.  . Drug Use: No  . Sexual Activity: Yes    Partners: Female   Other Topics Concern  . Not on file   Social History Narrative   Married Luis Yu)   Regular exercise: none   Caffeine use: cup of coffee daily    Retired    Caffeine- twice daily     PHYSICAL EXAM   Filed Vitals:   02/24/13 1033  BP: 139/76  Pulse: 70  Height: 6\' 2"  (1.88 m)  Weight: 244 lb (110.678 kg)     Body mass index is 31.31 kg/(m^2).   Generalized: In no acute distress  Neck: Supple, no carotid bruits   Cardiac: Regular rate rhythm  Pulmonary: Clear to auscultation bilaterally  Musculoskeletal: No deformity  Neurological examination  Mentation: Alert oriented to time, place, history taking, and causual conversation  Cranial nerve II-XII: Pupils were equal round reactive to light extraocular movements were full, Visual field were full on confrontational test. Bilateral fundi were sharp.  Facial sensation and strength were normal. Hearing was intact to finger rubbing bilaterally. Uvula tongue midline.  head turning and shoulder shrug and were normal and symmetric.Tongue protrusion into cheek strength was normal.  Motor:  he has mild left shoulder abduction, external rotation, left elbow flexion weakness, mild fixation of left upper extremity upon rapid rotating movement, length dependent decreased  fine touch, pinprick to ankle level, absent toe vibratory sensation, preserved toe vibratory and proprioception.  Coordination: Normal finger to nose, heel-to-shin bilaterally there was no truncal ataxia  Gait: Rising up from seated position without assistance, normal stance, without trunk ataxia, moderate stride, good arm swing, smooth turning, he has difficulty perform tiptoe, and heel walking  Romberg signs: Negative  Deep tendon reflexes: Brachioradialis 1/1, biceps 1/1, triceps 1/1, patellar absent, Achilles absent, plantar responses were flexor bilaterally.   DIAGNOSTIC DATA (LABS, IMAGING,  TESTING) - I reviewed patient records, labs, notes, testing and imaging myself where available.  Lab Results  Component Value Date   WBC 6.7 03/04/2010   HGB 13.7 03/04/2010   HCT 41.0 03/04/2010   MCV 93.2 03/04/2010   PLT 125* 03/04/2010      Component Value Date/Time   NA 140 02/10/2013 1136   K 3.6 02/10/2013 1136   CL 104 02/10/2013 1136   CO2 27 02/10/2013 1136   GLUCOSE 176* 02/10/2013 1136   BUN 21 02/10/2013 1136   CREATININE 1.1 02/10/2013 1136   CREATININE 1.24 03/18/2012 1635   CALCIUM 9.0 02/10/2013 1136   PROT 6.8 10/29/2012 1054   ALBUMIN 3.9 10/29/2012 1054   AST 22 10/29/2012 1054   ALT 22 10/29/2012 1054   ALKPHOS 74 10/29/2012 1054   BILITOT 0.9 10/29/2012 1054   GFRNONAA >60 03/04/2010 0400   GFRAA  Value: >60        The eGFR has been calculated using the MDRD equation. This calculation has not been validated in all clinical situations. eGFR's persistently <60 mL/min signify possible Chronic Kidney Disease. 03/04/2010 0400   Lab Results  Component Value Date   CHOL 149 10/29/2012   HDL 48.30 10/29/2012   LDLCALC 81 10/29/2012   TRIG 100.0 10/29/2012   CHOLHDL 3 10/29/2012   Lab Results  Component Value Date   HGBA1C 8.5* 02/10/2013   Lab Results  Component Value Date   VITAMINB12 386 10/29/2012   Lab Results  Component Value Date   TSH 2.63 10/29/2012    ASSESSMENT AND PLAN   67 years old Caucasian male, with past medical history of coronary artery disease, hypertension, diabetes poorly controlled most recent A1c 8 point 5, hyperlipidemia, coronary artery disease, chronic low back pain, now presenting with bilateral feet paresthesia, mild gait difficulty,  1, his gait difficulty are likely multifactor, his big body habitus, deconditioning, diabetic peripheral neuropathy, also need to rule out lumbar radiculopathy 2, MRI of lumbar spine 3, EMG nerve conduction study 4. I have suggested moderate exercise, daily walking, he refused physical therapy     Levert Feinstein, M.D. Ph.D.  [facility] [address] [place], [place] [address] [phone]

## 2013-03-14 DIAGNOSIS — J40 Bronchitis, not specified as acute or chronic: Secondary | ICD-10-CM | POA: Diagnosis not present

## 2013-03-14 DIAGNOSIS — J329 Chronic sinusitis, unspecified: Secondary | ICD-10-CM | POA: Diagnosis not present

## 2013-03-14 DIAGNOSIS — H6120 Impacted cerumen, unspecified ear: Secondary | ICD-10-CM | POA: Diagnosis not present

## 2013-03-24 ENCOUNTER — Encounter: Payer: Medicare Other | Admitting: Neurology

## 2013-04-14 DIAGNOSIS — H938X9 Other specified disorders of ear, unspecified ear: Secondary | ICD-10-CM | POA: Diagnosis not present

## 2013-04-17 ENCOUNTER — Ambulatory Visit: Payer: Medicare Other | Admitting: Internal Medicine

## 2013-04-24 ENCOUNTER — Ambulatory Visit (INDEPENDENT_AMBULATORY_CARE_PROVIDER_SITE_OTHER): Payer: Medicare Other | Admitting: Internal Medicine

## 2013-04-24 ENCOUNTER — Encounter: Payer: Self-pay | Admitting: Internal Medicine

## 2013-04-24 VITALS — BP 136/84 | HR 72 | Temp 97.9°F | Ht 74.0 in | Wt 245.0 lb

## 2013-04-24 DIAGNOSIS — R279 Unspecified lack of coordination: Secondary | ICD-10-CM | POA: Diagnosis not present

## 2013-04-24 DIAGNOSIS — E1149 Type 2 diabetes mellitus with other diabetic neurological complication: Secondary | ICD-10-CM

## 2013-04-24 DIAGNOSIS — R270 Ataxia, unspecified: Secondary | ICD-10-CM

## 2013-04-24 NOTE — Assessment & Plan Note (Signed)
Continue keeping blood sugar log.  Review at next office visit.

## 2013-04-24 NOTE — Progress Notes (Signed)
Subjective:    Patient ID: Luis Yu, male    DOB: March 22, 1945, 68 y.o.   MRN: [id number]  HPI  68 year old white male with history of coronary artery disease, uncontrolled type 2 diabetes, hypertension and diabetic polyneuropathy for followup. Patient referred to neurologist for evaluation of abnormal gait.  Repeat MRI of brain was essentially unchanged from previous MRI. Consultants notes reviewed. Patient's gait abnormality though to be multifactorial. MRI of lumbar spine and EMG recommended. Patient reluctant to have these procedures. He has not proceeded with recommendation for physical therapy.  Uncontrolled type 2 diabetes-no change in insulin therapy. Patient reports blood sugars are fairly stable.  Wife is helping patient keep blood sugar log.  CAD - negative for chest pain   Review of Systems Negative for chest pain or shortness of breath.  No significant change in mood     Past Medical History  Diagnosis Date  . Depression     type II, uncontrolled  . Hypertension   . Hyperlipidemia   . GERD (gastroesophageal reflux disease)   . CAD (coronary artery disease)   . Osteoarthritis   . Postherpetic neuralgia   . Herpes zoster   . Adjustment disorder with mixed emotional features   . Diabetic peripheral neuropathy   . Erectile dysfunction   . Obesity     History   Social History  . Marital Status: Married    Spouse Name: [name]    Number of Children: 1  . Years of Education: 13   Occupational History  . Retired Naval architecttruck driver    Social History Main Topics  . Smoking status: Never Smoker   . Smokeless tobacco: Never Used  . Alcohol Use: 1.0 oz/week    2 drink(s) per week     Comment: twice a month.  . Drug Use: No  . Sexual Activity: Yes    Partners: Female   Other Topics Concern  . Not on file   Social History Narrative   Married [name]([name])   Regular exercise: none   Caffeine use: cup of coffee daily    Retired    Caffeine- twice daily              Past Surgical History  Procedure Laterality Date  . Coronary artery bypass graft  1996    eith a LIMA to the LAD, saphenous vein graft to the acute marginal, saphenous vein graft to the PDA and saphenous vein graft to the circumflex.  . Knee arthroscopy  05/2004    right knee  . Replacement total knee bilateral    . Shoulder surgery      Family History  Problem Relation Age of Onset  . Coronary artery disease Mother     deceased at 6175  . Coronary artery disease Father     alive  . Stroke      half brother  . Stroke Brother     No Known Allergies  Current Outpatient Prescriptions on File Prior to Visit  Medication Sig Dispense Refill  . aspirin 325 MG EC tablet Take 325 mg by mouth daily.        [name]. atorvastatin (LIPITOR) 80 MG tablet TAKE 1 TABLET BY MOUTH EVERY DAY  90 tablet  1  . buPROPion (WELLBUTRIN XL) 300 MG 24 hr tablet TAKE 1 TABLET BY MOUTH EVERY DAY  90 tablet  1  . Cyanocobalamin (B-12) 5000 MCG SUBL Place 1 tablet under the tongue once a week.      .[name]  gabapentin (NEURONTIN) 300 MG capsule Take 1 capsule (300 mg total) by mouth at bedtime as needed. For neuropathy.  90 capsule  3  . glucose blood (ACCU-CHEK ACTIVE STRIPS) test strip Use as instructed  100 each  12  . insulin glargine (LANTUS) 100 UNIT/ML injection Inject 22 units in the evening subcutaneously.  45 mL  3  . Insulin Glulisine (APIDRA SOLOSTAR) 100 UNIT/ML SOPN Inject 15-30 Units into the skin 3 (three) times daily.  3 mL  3  . Insulin Pen Needle (PEN NEEDLES 31GX5/16") 31G X 8 MM MISC Use to inject insulin 5 times a day.  100 each  11  . Lancets (ACCU-CHEK MULTICLIX) lancets by Other route. Use up to 5 times a day to check blood sugar.       . lisinopril-hydrochlorothiazide (PRINZIDE,ZESTORETIC) 20-12.5 MG per tablet TAKE 1 TABLET BY MOUTH DAILY  90 tablet  1  . metFORMIN (GLUCOPHAGE) 1000 MG tablet Take 1 tablet (1,000 mg total) by mouth 2 (two) times daily with a meal.  180 tablet  1  . nebivolol  (BYSTOLIC) 5 MG tablet TAKE 1 TABLET EVERY DAY  90 tablet  1  . omeprazole (PRILOSEC) 40 MG capsule Take 1 capsule (40 mg total) by mouth daily.  90 capsule  1  . polyethylene glycol (MIRALAX / GLYCOLAX) packet Take 17 g by mouth daily. 17 grams mixed with water two times a day.        No current facility-administered medications on file prior to visit.    BP 136/84  Pulse 72  Temp(Src) 97.9 F (36.6 C) (Oral)  Ht 6\' 2"  (1.88 m)  Wt 245 lb (111.131 kg)  BMI 31.44 kg/m2    Objective:   Physical Exam  Constitutional: He is oriented to person, place, and time. He appears well-developed and well-nourished. No distress.  Neck: Neck supple.  Cardiovascular: Normal rate, regular rhythm and normal heart sounds.   No murmur heard. Pulmonary/Chest: Effort normal and breath sounds normal. He has no wheezes.  Musculoskeletal: He exhibits no edema.  Lymphadenopathy:    He has no cervical adenopathy.  Neurological: He is alert and oriented to person, place, and time. No cranial nerve deficit.  Psychiatric: He has a normal mood and affect. His behavior is normal.          Assessment & Plan:

## 2013-04-24 NOTE — Progress Notes (Signed)
Pre visit review using our clinic review tool, if applicable. No additional management support is needed unless otherwise documented below in the visit note. 

## 2013-04-24 NOTE — Assessment & Plan Note (Signed)
Patient seen by Dr. Terrace ArabiaYan. Repeat MRI of brain was unremarkable. His abnormal gait thought to be multifactorial.  He is reluctant to have MRI of lumbar spine and EMG. I doubt patient will be surgical candidate considering his multiple comorbidities. I recommend patient proceed with physical therapy for gait instability.

## 2013-04-29 ENCOUNTER — Telehealth: Payer: Self-pay

## 2013-04-29 NOTE — Telephone Encounter (Signed)
Relevant patient education assigned to patient using Emmi. ° °

## 2013-04-30 ENCOUNTER — Other Ambulatory Visit: Payer: Self-pay | Admitting: Internal Medicine

## 2013-05-18 DIAGNOSIS — B356 Tinea cruris: Secondary | ICD-10-CM | POA: Diagnosis not present

## 2013-05-21 DIAGNOSIS — Z79899 Other long term (current) drug therapy: Secondary | ICD-10-CM | POA: Diagnosis not present

## 2013-05-21 DIAGNOSIS — Z7982 Long term (current) use of aspirin: Secondary | ICD-10-CM | POA: Diagnosis not present

## 2013-05-21 DIAGNOSIS — I2510 Atherosclerotic heart disease of native coronary artery without angina pectoris: Secondary | ICD-10-CM | POA: Diagnosis not present

## 2013-05-21 DIAGNOSIS — Z794 Long term (current) use of insulin: Secondary | ICD-10-CM | POA: Diagnosis not present

## 2013-05-21 DIAGNOSIS — L02818 Cutaneous abscess of other sites: Secondary | ICD-10-CM | POA: Diagnosis not present

## 2013-05-21 DIAGNOSIS — IMO0002 Reserved for concepts with insufficient information to code with codable children: Secondary | ICD-10-CM | POA: Diagnosis not present

## 2013-05-21 DIAGNOSIS — N498 Inflammatory disorders of other specified male genital organs: Secondary | ICD-10-CM | POA: Diagnosis not present

## 2013-05-21 DIAGNOSIS — Z951 Presence of aortocoronary bypass graft: Secondary | ICD-10-CM | POA: Diagnosis not present

## 2013-05-21 DIAGNOSIS — E119 Type 2 diabetes mellitus without complications: Secondary | ICD-10-CM | POA: Diagnosis not present

## 2013-05-29 ENCOUNTER — Encounter: Payer: Self-pay | Admitting: Internal Medicine

## 2013-05-29 ENCOUNTER — Ambulatory Visit (INDEPENDENT_AMBULATORY_CARE_PROVIDER_SITE_OTHER): Payer: Medicare Other | Admitting: Internal Medicine

## 2013-05-29 VITALS — BP 122/74 | HR 68 | Temp 97.5°F | Ht 74.0 in | Wt 244.0 lb

## 2013-05-29 DIAGNOSIS — L02219 Cutaneous abscess of trunk, unspecified: Secondary | ICD-10-CM

## 2013-05-29 DIAGNOSIS — E1149 Type 2 diabetes mellitus with other diabetic neurological complication: Secondary | ICD-10-CM | POA: Diagnosis not present

## 2013-05-29 DIAGNOSIS — L03319 Cellulitis of trunk, unspecified: Secondary | ICD-10-CM

## 2013-05-29 DIAGNOSIS — L02214 Cutaneous abscess of groin: Secondary | ICD-10-CM

## 2013-05-29 MED ORDER — DOXYCYCLINE HYCLATE 100 MG PO TABS: 100.0000 mg | ORAL_TABLET | Freq: Two times a day (BID) | ORAL | Status: DC

## 2013-05-29 MED ORDER — METFORMIN HCL 1000 MG PO TABS: 1000.0000 mg | ORAL_TABLET | Freq: Two times a day (BID) | ORAL | Status: DC

## 2013-05-29 NOTE — Assessment & Plan Note (Signed)
Patient strongly encouraged to use his insulins as directed. He understands hyperglycemia may adversely affect his immune system.

## 2013-05-29 NOTE — Progress Notes (Signed)
Pre visit review using our clinic review tool, if applicable. No additional management support is needed unless otherwise documented below in the visit note. 

## 2013-05-29 NOTE — Progress Notes (Signed)
Subjective:    Patient ID: Luis Yu, male    DOB: 09-Apr-1945, 68 y.o.   MRN: [id number]  HPI  68 year old white male with history of uncontrolled type 2 diabetes, coronary artery disease and hypertension for emergency room followup. Patient is accompanied by supportive wife. She reports she was seen at urgent care near [facility]. Patient treated for possible ringworm near right upper thigh. Patient also had a small pimple near right groin. Patient reports abscess got larger and larger. He sought emergency medical care on 05/29/2013. Patient diagnosed with groin abscess. Wound culture positive for MRSA. Patient treated with Bactrim for 7 days. Patient reports abscess started off size of a tennis ball but pain and swelling have significantly improved. He has persistent area of redness and induration right groin.  DM II uncontrolled - blood sugars are poorly controlled.  His wife reports he is not checking his blood sugars on regular basis.   Review of Systems Negative for fever or chills.  Elevated blood sugars.    Past Medical History  Diagnosis Date  . Depression     type II, uncontrolled  . Hypertension   . Hyperlipidemia   . GERD (gastroesophageal reflux disease)   . CAD (coronary artery disease)   . Osteoarthritis   . Postherpetic neuralgia   . Herpes zoster   . Adjustment disorder with mixed emotional features   . Diabetic peripheral neuropathy   . Erectile dysfunction   . Obesity     History   Social History  . Marital Status: Married    Spouse Name: [name]    Number of Children: 1  . Years of Education: 13   Occupational History  . Retired Naval architect    Social History Main Topics  . Smoking status: Never Smoker   . Smokeless tobacco: Never Used  . Alcohol Use: 1.0 oz/week    2 drink(s) per week     Comment: twice a month.  . Drug Use: No  . Sexual Activity: Yes    Partners: Female   Other Topics Concern  . Not on file   Social History Narrative     Married [name])   Regular exercise: none   Caffeine use: cup of coffee daily    Retired    Caffeine- twice daily             Past Surgical History  Procedure Laterality Date  . Coronary artery bypass graft  1996    eith a LIMA to the LAD, saphenous vein graft to the acute marginal, saphenous vein graft to the PDA and saphenous vein graft to the circumflex.  . Knee arthroscopy  05/2004    right knee  . Replacement total knee bilateral    . Shoulder surgery      Family History  Problem Relation Age of Onset  . Coronary artery disease Mother     deceased at 59  . Coronary artery disease Father     alive  . Stroke      half brother  . Stroke Brother     No Known Allergies  Current Outpatient Prescriptions on File Prior to Visit  Medication Sig Dispense Refill  . aspirin 325 MG EC tablet Take 325 mg by mouth daily.        [name] atorvastatin (LIPITOR) 80 MG tablet TAKE 1 TABLET BY MOUTH EVERY DAY  90 tablet  1  . buPROPion (WELLBUTRIN XL) 300 MG 24 hr tablet TAKE 1 TABLET BY MOUTH EVERY  DAY  90 tablet  1  . Cyanocobalamin (B-12) 5000 MCG SUBL Place 1 tablet under the tongue once a week.      . gabapentin (NEURONTIN) 300 MG capsule Take 1 capsule (300 mg total) by mouth at bedtime as needed. For neuropathy.  90 capsule  3  . glucose blood (ACCU-CHEK ACTIVE STRIPS) test strip Use as instructed  100 each  12  . insulin glargine (LANTUS) 100 UNIT/ML injection Inject 22 units in the evening subcutaneously.  45 mL  3  . Insulin Glulisine (APIDRA SOLOSTAR) 100 UNIT/ML SOPN Inject 15-30 Units into the skin 3 (three) times daily.  3 mL  3  . Insulin Pen Needle (PEN NEEDLES 31GX5/16") 31G X 8 MM MISC Use to inject insulin 5 times a day.  100 each  11  . Lancets (ACCU-CHEK MULTICLIX) lancets by Other route. Use up to 5 times a day to check blood sugar.       . lisinopril-hydrochlorothiazide (PRINZIDE,ZESTORETIC) 20-12.5 MG per tablet TAKE 1 TABLET BY MOUTH DAILY  90 tablet  1  . metFORMIN  (GLUCOPHAGE) 1000 MG tablet Take 1 tablet (1,000 mg total) by mouth 2 (two) times daily with a meal.  180 tablet  1  . nebivolol (BYSTOLIC) 5 MG tablet TAKE 1 TABLET EVERY DAY  90 tablet  1  . omeprazole (PRILOSEC) 40 MG capsule TAKE ONE CAPSULE BY MOUTH DAILY  90 capsule  0  . polyethylene glycol (MIRALAX / GLYCOLAX) packet Take 17 g by mouth daily. 17 grams mixed with water two times a day.        No current facility-administered medications on file prior to visit.    BP 122/74  Pulse 68  Temp(Src) 97.5 F (36.4 C) (Oral)  Ht 6\' 2"  (1.88 m)  Wt 244 lb (110.678 kg)  BMI 31.31 kg/m2    Objective:   Physical Exam  Constitutional: He appears well-developed and well-nourished. No distress.  HENT:  Head: Normocephalic and atraumatic.  Neck: Neck supple.  Cardiovascular: Normal rate, regular rhythm and normal heart sounds.   No murmur heard. Pulmonary/Chest: Effort normal and breath sounds normal. He has no wheezes.  Genitourinary: Penis normal.  Lymphadenopathy:    He has no cervical adenopathy.  Skin:  Oblong 2 x 4 cm open abscess right groin with mild surrounding erythema and induration  Psychiatric: He has a normal mood and affect. His behavior is normal.       Assessment & Plan:

## 2013-05-29 NOTE — Patient Instructions (Signed)
Soak in epsom salt bath as directed Contact our office if your abscess gets worse or you develop fever or chills.

## 2013-05-29 NOTE — Assessment & Plan Note (Signed)
Patient has right groin abscess positive for MRSA. Patient treated with course of Bactrim. Patient has persistent area of redness and induration (2x4 cm). Mild purulent drainage. Treat with doxycycline 100 mg twice daily for additional 10 days.  Reassess in 1 week.  Patient understands to contact our office should he develops fever or chills.  He may need incision and drainage.

## 2013-06-05 ENCOUNTER — Encounter: Payer: Self-pay | Admitting: Internal Medicine

## 2013-06-05 ENCOUNTER — Ambulatory Visit (INDEPENDENT_AMBULATORY_CARE_PROVIDER_SITE_OTHER): Payer: Medicare Other | Admitting: Internal Medicine

## 2013-06-05 VITALS — BP 122/74 | Temp 97.9°F | Ht 74.0 in | Wt 244.0 lb

## 2013-06-05 DIAGNOSIS — L02219 Cutaneous abscess of trunk, unspecified: Secondary | ICD-10-CM | POA: Diagnosis not present

## 2013-06-05 DIAGNOSIS — S50312A Abrasion of left elbow, initial encounter: Secondary | ICD-10-CM

## 2013-06-05 DIAGNOSIS — L03319 Cellulitis of trunk, unspecified: Secondary | ICD-10-CM

## 2013-06-05 DIAGNOSIS — L02214 Cutaneous abscess of groin: Secondary | ICD-10-CM

## 2013-06-05 DIAGNOSIS — IMO0002 Reserved for concepts with insufficient information to code with codable children: Secondary | ICD-10-CM | POA: Diagnosis not present

## 2013-06-05 MED ORDER — NEBIVOLOL HCL 5 MG PO TABS: ORAL_TABLET | ORAL | Status: DC

## 2013-06-05 MED ORDER — GLUCOSE BLOOD VI STRP: ORAL_STRIP | Status: DC

## 2013-06-05 MED ORDER — ATORVASTATIN CALCIUM 80 MG PO TABS: ORAL_TABLET | ORAL | Status: DC

## 2013-06-05 MED ORDER — "PEN NEEDLES 5/16"" 31G X 8 MM MISC": Status: DC

## 2013-06-05 MED ORDER — OMEPRAZOLE 40 MG PO CPDR: 40.0000 mg | DELAYED_RELEASE_CAPSULE | Freq: Every day | ORAL | Status: DC

## 2013-06-05 MED ORDER — INSULIN GLARGINE 100 UNIT/ML ~~LOC~~ SOLN: SUBCUTANEOUS | Status: DC

## 2013-06-05 MED ORDER — GABAPENTIN 300 MG PO CAPS: 300.0000 mg | ORAL_CAPSULE | Freq: Every evening | ORAL | Status: DC | PRN

## 2013-06-05 MED ORDER — INSULIN GLULISINE 100 UNIT/ML SOLOSTAR PEN: 15.0000 [IU] | PEN_INJECTOR | Freq: Three times a day (TID) | SUBCUTANEOUS | Status: DC

## 2013-06-05 MED ORDER — ACCU-CHEK MULTICLIX LANCETS MISC: Status: DC

## 2013-06-05 MED ORDER — BUPROPION HCL ER (XL) 300 MG PO TB24: ORAL_TABLET | ORAL | Status: DC

## 2013-06-05 MED ORDER — LISINOPRIL-HYDROCHLOROTHIAZIDE 20-12.5 MG PO TABS: ORAL_TABLET | ORAL | Status: DC

## 2013-06-05 NOTE — Progress Notes (Signed)
Subjective:    Patient ID: Luis Yu, male    DOB: 21-Jul-1945, 68 y.o.   MRN: [id number]  HPI  68 year old white male with history of coronary artery disease, hypertension and uncontrolled type 2 diabetes for followup regarding right groin (MRSA) abscess.  Patient was initially treated with course of Bactrim after ER evaluation. Patient still had area of redness and minimal drainage at previous visit. Patient is on day 7 of doxycycline 100 mg twice daily (7 of 10). Patient also used Epsom salt baths. Area of induration is much smaller. He denies any tenderness.  Patient also complains of left elbow abrasion. He fell doing yard work approximately one week ago.   Review of Systems Negative for fever or chills.    Past Medical History  Diagnosis Date  . Depression     type II, uncontrolled  . Hypertension   . Hyperlipidemia   . GERD (gastroesophageal reflux disease)   . CAD (coronary artery disease)   . Osteoarthritis   . Postherpetic neuralgia   . Herpes zoster   . Adjustment disorder with mixed emotional features   . Diabetic peripheral neuropathy   . Erectile dysfunction   . Obesity     History   Social History  . Marital Status: Married    Spouse Name: [name]    Number of Children: 1  . Years of Education: 13   Occupational History  . Retired Naval architect    Social History Main Topics  . Smoking status: Never Smoker   . Smokeless tobacco: Never Used  . Alcohol Use: 1.0 oz/week    2 drink(s) per week     Comment: twice a month.  . Drug Use: No  . Sexual Activity: Yes    Partners: Female   Other Topics Concern  . Not on file   Social History Narrative   Married [name])   Regular exercise: none   Caffeine use: cup of coffee daily    Retired    Caffeine- twice daily             Past Surgical History  Procedure Laterality Date  . Coronary artery bypass graft  1996    eith a LIMA to the LAD, saphenous vein graft to the acute marginal, saphenous  vein graft to the PDA and saphenous vein graft to the circumflex.  . Knee arthroscopy  05/2004    right knee  . Replacement total knee bilateral    . Shoulder surgery      Family History  Problem Relation Age of Onset  . Coronary artery disease Mother     deceased at 66  . Coronary artery disease Father     alive  . Stroke      half brother  . Stroke Brother     No Known Allergies  Current Outpatient Prescriptions on File Prior to Visit  Medication Sig Dispense Refill  . aspirin 325 MG EC tablet Take 325 mg by mouth daily.        . Cyanocobalamin (B-12) 5000 MCG SUBL Place 1 tablet under the tongue once a week.      . doxycycline (VIBRA-TABS) 100 MG tablet Take 1 tablet (100 mg total) by mouth 2 (two) times daily.  20 tablet  0  . metFORMIN (GLUCOPHAGE) 1000 MG tablet Take 1 tablet (1,000 mg total) by mouth 2 (two) times daily with a meal.  180 tablet  1  . polyethylene glycol (MIRALAX / GLYCOLAX) packet Take 17 g by  mouth daily. 17 grams mixed with water two times a day.        No current facility-administered medications on file prior to visit.    BP 122/74  Temp(Src) 97.9 F (36.6 C) (Oral)  Ht 6\' 2"  (1.88 m)  Wt 244 lb (110.678 kg)  BMI 31.31 kg/m2    Objective:   Physical Exam  Constitutional: He is oriented to person, place, and time. He appears well-developed and well-nourished. No distress.  HENT:  Head: Normocephalic and atraumatic.  Eyes: EOM are normal. Pupils are equal, round, and reactive to light.  Cardiovascular: Normal rate, regular rhythm and normal heart sounds.   No murmur heard. Pulmonary/Chest: Effort normal and breath sounds normal. He has no wheezes.  No left sided rib tenderness.  No sign of contusion  Abdominal: Soft. Bowel sounds are normal. There is no tenderness.  Musculoskeletal: He exhibits no edema.  Neurological: He is alert and oriented to person, place, and time. No cranial nerve deficit.  Skin: Skin is warm and dry.  Right groin  abcess smaller and less red.  Mild inderation.  No purulent drainage.   Left elbow abrasion.  Mild surrounding redness.  No drainage.  Psychiatric: He has a normal mood and affect. His behavior is normal.          Assessment & Plan:

## 2013-06-05 NOTE — Progress Notes (Signed)
Pre visit review using our clinic review tool, if applicable. No additional management support is needed unless otherwise documented below in the visit note. 

## 2013-06-05 NOTE — Assessment & Plan Note (Signed)
Patient reports falling while doing yard work. He suffered abrasion of left elbow. No evidence of secondary cellulitis. Patient advised to keep area clean and change dressing for next 7-10 days.  Patient advised to call office if symptoms persist or worsen.

## 2013-06-05 NOTE — Patient Instructions (Signed)
Use wet to dry dressing changes for left elbow Please contact our office if your symptoms do not improve or gets worse. Keep you next lab and office visit appointment Bring your blood sugar log to your next follow up visit.

## 2013-06-05 NOTE — Assessment & Plan Note (Signed)
Improved.  Patient initially treated with course of Bactrim and doxycycline. Area of induration is much smaller. No active drainage. Continue local care for additional 1 to 2 weeks.

## 2013-06-08 ENCOUNTER — Ambulatory Visit: Payer: Medicare Other | Admitting: Internal Medicine

## 2013-06-18 ENCOUNTER — Other Ambulatory Visit: Payer: Self-pay | Admitting: Internal Medicine

## 2013-06-26 ENCOUNTER — Other Ambulatory Visit (INDEPENDENT_AMBULATORY_CARE_PROVIDER_SITE_OTHER): Payer: Medicare Other

## 2013-06-26 DIAGNOSIS — E1139 Type 2 diabetes mellitus with other diabetic ophthalmic complication: Secondary | ICD-10-CM | POA: Diagnosis not present

## 2013-06-26 DIAGNOSIS — IMO0001 Reserved for inherently not codable concepts without codable children: Secondary | ICD-10-CM

## 2013-06-26 DIAGNOSIS — E119 Type 2 diabetes mellitus without complications: Secondary | ICD-10-CM | POA: Diagnosis not present

## 2013-06-26 DIAGNOSIS — Z961 Presence of intraocular lens: Secondary | ICD-10-CM | POA: Diagnosis not present

## 2013-06-26 DIAGNOSIS — H02409 Unspecified ptosis of unspecified eyelid: Secondary | ICD-10-CM | POA: Diagnosis not present

## 2013-06-26 DIAGNOSIS — E11329 Type 2 diabetes mellitus with mild nonproliferative diabetic retinopathy without macular edema: Secondary | ICD-10-CM | POA: Diagnosis not present

## 2013-06-26 DIAGNOSIS — E1165 Type 2 diabetes mellitus with hyperglycemia: Principal | ICD-10-CM

## 2013-06-26 LAB — BASIC METABOLIC PANEL
BUN: 21 mg/dL (ref 6–23)
CO2: 29 mEq/L (ref 19–32)
Calcium: 9.2 mg/dL (ref 8.4–10.5)
Chloride: 103 mEq/L (ref 96–112)
Creatinine, Ser: 1.2 mg/dL (ref 0.4–1.5)
GFR: 67.01 mL/min (ref >60.00)
Glucose, Bld: 142 mg/dL — ABNORMAL HIGH (ref 70–99)
Potassium: 4.4 mEq/L (ref 3.5–5.1)
Sodium: 139 mEq/L (ref 135–145)

## 2013-06-26 LAB — HEMOGLOBIN A1C: Hgb A1c MFr Bld: 8.5 % — ABNORMAL HIGH (ref 4.6–6.5)

## 2013-06-26 LAB — HM DIABETES EYE EXAM

## 2013-07-03 ENCOUNTER — Encounter: Payer: Self-pay | Admitting: Internal Medicine

## 2013-07-03 ENCOUNTER — Ambulatory Visit (INDEPENDENT_AMBULATORY_CARE_PROVIDER_SITE_OTHER): Payer: Medicare Other | Admitting: Internal Medicine

## 2013-07-03 VITALS — BP 122/74 | HR 76 | Temp 97.9°F | Ht 74.0 in | Wt 246.0 lb

## 2013-07-03 DIAGNOSIS — L03319 Cellulitis of trunk, unspecified: Secondary | ICD-10-CM | POA: Diagnosis not present

## 2013-07-03 DIAGNOSIS — L02214 Cutaneous abscess of groin: Secondary | ICD-10-CM

## 2013-07-03 DIAGNOSIS — H6120 Impacted cerumen, unspecified ear: Secondary | ICD-10-CM | POA: Diagnosis not present

## 2013-07-03 DIAGNOSIS — R269 Unspecified abnormalities of gait and mobility: Secondary | ICD-10-CM | POA: Diagnosis not present

## 2013-07-03 DIAGNOSIS — E1149 Type 2 diabetes mellitus with other diabetic neurological complication: Secondary | ICD-10-CM

## 2013-07-03 DIAGNOSIS — L02219 Cutaneous abscess of trunk, unspecified: Secondary | ICD-10-CM | POA: Diagnosis not present

## 2013-07-03 MED ORDER — INSULIN GLULISINE 100 UNIT/ML SOLOSTAR PEN: 15.0000 [IU] | PEN_INJECTOR | Freq: Four times a day (QID) | SUBCUTANEOUS | Status: DC

## 2013-07-03 NOTE — Patient Instructions (Signed)
Bring your blood sugar log to your next appointment

## 2013-07-03 NOTE — Progress Notes (Signed)
Subjective:    Patient ID: Luis Yu, male    DOB: Jun 01, 1945, 68 y.o.   MRN: [id number]  HPI  68 year old white male with history of uncontrolled type 2 diabetes, coronary artery disease and gait instability for routine followup. Patient previously seen for right groin abscess.  He finished full course of doxycycline and local wound care. His abscess completely resolved. No residual drainage or redness.  Patient also reports left elbow well-healed after suffering abrasion.  Type 2 diabetes-his blood sugar log reviewed in detail. Patient's morning blood sugars are usually between 120 and 180. Patient's wife reports he frequently snacks after her evening meal.  He does not use any short acting insulin with evening snacks.  Lab Results  Component Value Date   HGBA1C 8.5* 06/26/2013     History of bilateral hearing aids-he reports wax buildup in both ears  Review of Systems Negative for chest pain, no fall since previous visit    Past Medical History  Diagnosis Date  . Depression     type II, uncontrolled  . Hypertension   . Hyperlipidemia   . GERD (gastroesophageal reflux disease)   . CAD (coronary artery disease)   . Osteoarthritis   . Postherpetic neuralgia   . Herpes zoster   . Adjustment disorder with mixed emotional features   . Diabetic peripheral neuropathy   . Erectile dysfunction   . Obesity     History   Social History  . Marital Status: Married    Spouse Name: Luis Yu    Number of Children: 1  . Years of Education: 13   Occupational History  . Retired Naval architecttruck driver    Social History Main Topics  . Smoking status: Never Smoker   . Smokeless tobacco: Never Used  . Alcohol Use: 1.0 oz/week    2 drink(s) per week     Comment: twice a month.  . Drug Use: No  . Sexual Activity: Yes    Partners: Female   Other Topics Concern  . Not on file   Social History Narrative   Married Luis Butt([name])   Regular exercise: none   Caffeine use: cup of coffee daily      Retired    Caffeine- twice daily             Past Surgical History  Procedure Laterality Date  . Coronary artery bypass graft  1996    eith a LIMA to the LAD, saphenous vein graft to the acute marginal, saphenous vein graft to the PDA and saphenous vein graft to the circumflex.  . Knee arthroscopy  05/2004    right knee  . Replacement total knee bilateral    . Shoulder surgery      Family History  Problem Relation Age of Onset  . Coronary artery disease Mother     deceased at 3975  . Coronary artery disease Father     alive  . Stroke      half brother  . Stroke Brother     No Known Allergies  Current Outpatient Prescriptions on File Prior to Visit  Medication Sig Dispense Refill  . aspirin 325 MG EC tablet Take 325 mg by mouth daily.        [name]. atorvastatin (LIPITOR) 80 MG tablet TAKE 1 TABLET BY MOUTH EVERY DAY  90 tablet  1  . buPROPion (WELLBUTRIN XL) 300 MG 24 hr tablet TAKE 1 TABLET BY MOUTH EVERY DAY  90 tablet  1  . BYSTOLIC 5 MG tablet  TAKE 1 TABLET BY MOUTH EVERY DAY  90 tablet  0  . Cyanocobalamin (B-12) 5000 MCG SUBL Place 1 tablet under the tongue once a week.      . gabapentin (NEURONTIN) 300 MG capsule Take 1 capsule (300 mg total) by mouth at bedtime as needed. For neuropathy.  90 capsule  3  . glucose blood (ACCU-CHEK ACTIVE STRIPS) test strip Use as instructed  100 each  12  . insulin glargine (LANTUS) 100 UNIT/ML injection Inject 22 units in the evening subcutaneously.  50 mL  5  . Insulin Pen Needle (PEN NEEDLES 31GX5/16") 31G X 8 MM MISC Use to inject insulin 5 times a day.  100 each  11  . Lancets (ACCU-CHEK MULTICLIX) lancets Use up to 5 times a day to check blood sugar.  100 each  5  . lisinopril-hydrochlorothiazide (PRINZIDE,ZESTORETIC) 20-12.5 MG per tablet TAKE 1 TABLET BY MOUTH DAILY  90 tablet  1  . metFORMIN (GLUCOPHAGE) 1000 MG tablet Take 1 tablet (1,000 mg total) by mouth 2 (two) times daily with a meal.  180 tablet  1  . omeprazole (PRILOSEC)  40 MG capsule Take 1 capsule (40 mg total) by mouth daily.  90 capsule  1  . polyethylene glycol (MIRALAX / GLYCOLAX) packet Take 17 g by mouth daily. 17 grams mixed with water two times a day.        No current facility-administered medications on file prior to visit.    BP 122/74  Pulse 76  Temp(Src) 97.9 F (36.6 C) (Oral)  Ht 6\' 2"  (1.88 m)  Wt 246 lb (111.585 kg)  BMI 31.57 kg/m2      Objective:   Physical Exam  Constitutional: He is oriented to person, place, and time. He appears well-developed and well-nourished.  HENT:  Head: Normocephalic and atraumatic.  Bilateral cerumen  Neck: Neck supple.  No carotid bruit  Cardiovascular: Normal rate and regular rhythm.   Pulmonary/Chest: Effort normal and breath sounds normal. He has no wheezes.  Musculoskeletal:  Trace lower extremity edema  Lymphadenopathy:    He has no cervical adenopathy.  Neurological: He is alert and oriented to person, place, and time. No cranial nerve deficit.  Skin:  Right groin abscess well-healed,  Left elbow abrasion well healed          Assessment & Plan:

## 2013-07-03 NOTE — Assessment & Plan Note (Signed)
Utilized to curette to remove cerumen in right ear.

## 2013-07-03 NOTE — Assessment & Plan Note (Signed)
Arrange physical therapy in [place] near his [place] home.

## 2013-07-03 NOTE — Assessment & Plan Note (Signed)
Patient's A1c not improved. I suspect poor control secondary to postprandial hyperglycemia. Patient advised to use short acting insulin with evening snacks.  Continue blood sugar log.   Reassess in 8 weeks.

## 2013-07-03 NOTE — Progress Notes (Signed)
Pre visit review using our clinic review tool, if applicable. No additional management support is needed unless otherwise documented below in the visit note. 

## 2013-07-03 NOTE — Assessment & Plan Note (Signed)
Resolved with doxycycline and local wound care.

## 2013-07-10 ENCOUNTER — Ambulatory Visit: Payer: Medicare Other | Admitting: Internal Medicine

## 2013-07-16 ENCOUNTER — Telehealth: Payer: Self-pay | Admitting: Internal Medicine

## 2013-07-16 NOTE — Telephone Encounter (Signed)
Can you have one of the other physicians sign paper rx for PT re: gaiting and balance strengthening.

## 2013-07-16 NOTE — Telephone Encounter (Signed)
Mae from [facility] called about script for outpatient pt for gait  and balance training need to faxed to [phone]  Phone number; [phone] pt has an appt at 3pm on 07/17/13 and office need script by 3 pm

## 2013-07-17 DIAGNOSIS — IMO0002 Reserved for concepts with insufficient information to code with codable children: Secondary | ICD-10-CM | POA: Diagnosis not present

## 2013-07-17 DIAGNOSIS — R262 Difficulty in walking, not elsewhere classified: Secondary | ICD-10-CM | POA: Diagnosis not present

## 2013-07-17 DIAGNOSIS — M6281 Muscle weakness (generalized): Secondary | ICD-10-CM | POA: Diagnosis not present

## 2013-07-17 NOTE — Telephone Encounter (Signed)
Order faxed.

## 2013-07-20 DIAGNOSIS — IMO0002 Reserved for concepts with insufficient information to code with codable children: Secondary | ICD-10-CM | POA: Diagnosis not present

## 2013-07-20 DIAGNOSIS — M6281 Muscle weakness (generalized): Secondary | ICD-10-CM | POA: Diagnosis not present

## 2013-07-20 DIAGNOSIS — R262 Difficulty in walking, not elsewhere classified: Secondary | ICD-10-CM | POA: Diagnosis not present

## 2013-07-22 DIAGNOSIS — R262 Difficulty in walking, not elsewhere classified: Secondary | ICD-10-CM | POA: Diagnosis not present

## 2013-07-22 DIAGNOSIS — M6281 Muscle weakness (generalized): Secondary | ICD-10-CM | POA: Diagnosis not present

## 2013-07-22 DIAGNOSIS — IMO0002 Reserved for concepts with insufficient information to code with codable children: Secondary | ICD-10-CM | POA: Diagnosis not present

## 2013-07-24 DIAGNOSIS — IMO0002 Reserved for concepts with insufficient information to code with codable children: Secondary | ICD-10-CM | POA: Diagnosis not present

## 2013-07-24 DIAGNOSIS — R262 Difficulty in walking, not elsewhere classified: Secondary | ICD-10-CM | POA: Diagnosis not present

## 2013-07-24 DIAGNOSIS — M6281 Muscle weakness (generalized): Secondary | ICD-10-CM | POA: Diagnosis not present

## 2013-07-27 DIAGNOSIS — R262 Difficulty in walking, not elsewhere classified: Secondary | ICD-10-CM | POA: Diagnosis not present

## 2013-07-27 DIAGNOSIS — IMO0002 Reserved for concepts with insufficient information to code with codable children: Secondary | ICD-10-CM | POA: Diagnosis not present

## 2013-07-27 DIAGNOSIS — M6281 Muscle weakness (generalized): Secondary | ICD-10-CM | POA: Diagnosis not present

## 2013-07-29 DIAGNOSIS — R262 Difficulty in walking, not elsewhere classified: Secondary | ICD-10-CM | POA: Diagnosis not present

## 2013-07-29 DIAGNOSIS — IMO0002 Reserved for concepts with insufficient information to code with codable children: Secondary | ICD-10-CM | POA: Diagnosis not present

## 2013-07-29 DIAGNOSIS — M6281 Muscle weakness (generalized): Secondary | ICD-10-CM | POA: Diagnosis not present

## 2013-07-30 DIAGNOSIS — M1610 Unilateral primary osteoarthritis, unspecified hip: Secondary | ICD-10-CM | POA: Diagnosis not present

## 2013-07-30 DIAGNOSIS — M169 Osteoarthritis of hip, unspecified: Secondary | ICD-10-CM | POA: Diagnosis not present

## 2013-07-30 DIAGNOSIS — M25559 Pain in unspecified hip: Secondary | ICD-10-CM | POA: Diagnosis not present

## 2013-07-31 DIAGNOSIS — R262 Difficulty in walking, not elsewhere classified: Secondary | ICD-10-CM | POA: Diagnosis not present

## 2013-07-31 DIAGNOSIS — M6281 Muscle weakness (generalized): Secondary | ICD-10-CM | POA: Diagnosis not present

## 2013-07-31 DIAGNOSIS — IMO0002 Reserved for concepts with insufficient information to code with codable children: Secondary | ICD-10-CM | POA: Diagnosis not present

## 2013-08-02 ENCOUNTER — Other Ambulatory Visit: Payer: Self-pay | Admitting: Internal Medicine

## 2013-08-03 DIAGNOSIS — IMO0002 Reserved for concepts with insufficient information to code with codable children: Secondary | ICD-10-CM | POA: Diagnosis not present

## 2013-08-03 DIAGNOSIS — M6281 Muscle weakness (generalized): Secondary | ICD-10-CM | POA: Diagnosis not present

## 2013-08-03 DIAGNOSIS — R262 Difficulty in walking, not elsewhere classified: Secondary | ICD-10-CM | POA: Diagnosis not present

## 2013-08-05 DIAGNOSIS — R262 Difficulty in walking, not elsewhere classified: Secondary | ICD-10-CM | POA: Diagnosis not present

## 2013-08-05 DIAGNOSIS — M6281 Muscle weakness (generalized): Secondary | ICD-10-CM | POA: Diagnosis not present

## 2013-08-05 DIAGNOSIS — IMO0002 Reserved for concepts with insufficient information to code with codable children: Secondary | ICD-10-CM | POA: Diagnosis not present

## 2013-08-07 DIAGNOSIS — R262 Difficulty in walking, not elsewhere classified: Secondary | ICD-10-CM | POA: Diagnosis not present

## 2013-08-07 DIAGNOSIS — M6281 Muscle weakness (generalized): Secondary | ICD-10-CM | POA: Diagnosis not present

## 2013-08-07 DIAGNOSIS — IMO0002 Reserved for concepts with insufficient information to code with codable children: Secondary | ICD-10-CM | POA: Diagnosis not present

## 2013-08-12 DIAGNOSIS — R262 Difficulty in walking, not elsewhere classified: Secondary | ICD-10-CM | POA: Diagnosis not present

## 2013-08-12 DIAGNOSIS — M6281 Muscle weakness (generalized): Secondary | ICD-10-CM | POA: Diagnosis not present

## 2013-08-12 DIAGNOSIS — IMO0002 Reserved for concepts with insufficient information to code with codable children: Secondary | ICD-10-CM | POA: Diagnosis not present

## 2013-08-13 DIAGNOSIS — M6281 Muscle weakness (generalized): Secondary | ICD-10-CM | POA: Diagnosis not present

## 2013-08-13 DIAGNOSIS — IMO0002 Reserved for concepts with insufficient information to code with codable children: Secondary | ICD-10-CM | POA: Diagnosis not present

## 2013-08-13 DIAGNOSIS — R262 Difficulty in walking, not elsewhere classified: Secondary | ICD-10-CM | POA: Diagnosis not present

## 2013-08-20 DIAGNOSIS — M79609 Pain in unspecified limb: Secondary | ICD-10-CM | POA: Diagnosis not present

## 2013-08-20 DIAGNOSIS — M6281 Muscle weakness (generalized): Secondary | ICD-10-CM | POA: Diagnosis not present

## 2013-08-20 DIAGNOSIS — M5137 Other intervertebral disc degeneration, lumbosacral region: Secondary | ICD-10-CM | POA: Diagnosis not present

## 2013-08-20 DIAGNOSIS — IMO0002 Reserved for concepts with insufficient information to code with codable children: Secondary | ICD-10-CM | POA: Diagnosis not present

## 2013-08-28 ENCOUNTER — Ambulatory Visit (INDEPENDENT_AMBULATORY_CARE_PROVIDER_SITE_OTHER): Payer: Medicare Other | Admitting: Internal Medicine

## 2013-08-28 ENCOUNTER — Encounter: Payer: Self-pay | Admitting: Internal Medicine

## 2013-08-28 VITALS — BP 130/80 | HR 68 | Temp 97.9°F | Wt 242.0 lb

## 2013-08-28 DIAGNOSIS — E1149 Type 2 diabetes mellitus with other diabetic neurological complication: Secondary | ICD-10-CM

## 2013-08-28 DIAGNOSIS — R269 Unspecified abnormalities of gait and mobility: Secondary | ICD-10-CM

## 2013-08-28 DIAGNOSIS — M79609 Pain in unspecified limb: Secondary | ICD-10-CM

## 2013-08-28 DIAGNOSIS — M79651 Pain in right thigh: Secondary | ICD-10-CM

## 2013-08-28 DIAGNOSIS — M5137 Other intervertebral disc degeneration, lumbosacral region: Secondary | ICD-10-CM | POA: Diagnosis not present

## 2013-08-28 MED ORDER — DICLOFENAC SODIUM 1 % TD GEL: 2.0000 g | Freq: Three times a day (TID) | TRANSDERMAL | Status: DC

## 2013-08-28 NOTE — Assessment & Plan Note (Addendum)
68 year old white male followed by orthopedic specialist for right side pain of unclear etiology. On exam his symptoms most consistent with quadricep muscle strain. Proceed with MRI. I doubt his symptoms related to his previous issues with right groin abscess. However if persistent symptoms, consider obtaining CBC with differential. MRI of  thigh should provide additional information.  I doubt his symptoms secondary to lumbar radiculopathy.  Discontinue ibuprofen as patient is much higher risk for renal insufficiency. Trial of topical Voltaren gel 3 times a day.

## 2013-08-28 NOTE — Progress Notes (Signed)
Pre visit review using our clinic review tool, if applicable. No additional management support is needed unless otherwise documented below in the visit note. 

## 2013-08-28 NOTE — Progress Notes (Signed)
Subjective:    Patient ID: Luis Yu, male    DOB: [birth year], 68 y.o.   MRN: [id number]  HPI  68 year old white male with history of uncontrolled type 2 diabetes, coronary artery disease include instability for followup. [place] he has been experiencing right upper thigh pain of unclear etiology. His symptoms started after suffering fall while he was working in his yard. He also complains of mild right posterior hip pain. He was seen by physical therapy and local orthopedic specialist. Etiology of right thigh pain not entirely clear. Patient reports MRI of affected area planned.  Unclear if MRI of lumbar spine also planned.  He denies severe back pain.  His symptoms worse with walking/weightbearing. His symptoms did not improve with physical therapy.   DM II uncontrolled - he has not kept BP log due to issues with right thigh pain.  Review of Systems Negative for right lower ext pain, negative for fever or chills.    Past Medical History  Diagnosis Date  . Depression   . Hypertension   . Hyperlipidemia   . GERD (gastroesophageal reflux disease)   . CAD (coronary artery disease)   . Osteoarthritis   . Postherpetic neuralgia   . Herpes zoster   . Adjustment disorder with mixed emotional features   . Diabetic peripheral neuropathy   . Erectile dysfunction   . Obesity   . Diabetes mellitus type II, uncontrolled     History   Social History  . Marital Status: Married    Spouse Name: [name]    Number of Children: 1  . Years of Education: 13   Occupational History  . Retired Naval architecttruck driver    Social History Main Topics  . Smoking status: Never Smoker   . Smokeless tobacco: Never Used  . Alcohol Use: 1.0 oz/week    2 drink(s) per week     Comment: twice a month.  . Drug Use: No  . Sexual Activity: Yes    Partners: Female   Other Topics Concern  . Not on file   Social History Narrative   Married [name]([name])   Regular exercise: none     Caffeine use: cup of coffee daily    Retired    Caffeine- twice daily             Past Surgical History  Procedure Laterality Date  . Coronary artery bypass graft  1996    eith a LIMA to the LAD, saphenous vein graft to the acute marginal, saphenous vein graft to the PDA and saphenous vein graft to the circumflex.  . Knee arthroscopy  05/2004    right knee  . Replacement total knee bilateral    . Shoulder surgery      Family History  Problem Relation Age of Onset  . Coronary artery disease Mother     deceased at 1875  . Coronary artery disease Father     alive  . Stroke      half brother  . Stroke Brother     No Known Allergies  Current Outpatient Prescriptions on File Prior to Visit  Medication Sig Dispense Refill  . aspirin 325 MG EC tablet Take 325 mg by mouth daily.        [name]. atorvastatin (LIPITOR) 80 MG tablet TAKE 1 TABLET BY MOUTH EVERY DAY  90 tablet  1  . buPROPion (WELLBUTRIN XL) 300 MG 24 hr tablet TAKE 1 TABLET BY MOUTH EVERY DAY  90 tablet  1  . BYSTOLIC 5 MG tablet TAKE 1 TABLET BY MOUTH EVERY DAY  90 tablet  0  . Cyanocobalamin (B-12) 5000 MCG SUBL Place 1 tablet under the tongue once a week.      . fluticasone (FLONASE) 50 MCG/ACT nasal spray Place 2 sprays into both nostrils daily.      [name]. gabapentin (NEURONTIN) 300 MG capsule Take 1 capsule (300 mg total) by mouth at bedtime as needed. For neuropathy.  90 capsule  3  . glucose blood (ACCU-CHEK ACTIVE STRIPS) test strip Use as instructed  100 each  12  . insulin glargine (LANTUS) 100 UNIT/ML injection Inject 22 units in the evening subcutaneously.  50 mL  5  . Insulin Glulisine (APIDRA SOLOSTAR) 100 UNIT/ML Solostar Pen Inject 15-30 Units into the skin 4 (four) times daily.  15 mL  5  . Insulin Pen Needle (PEN NEEDLES 31GX5/16") 31G X 8 MM MISC Use to inject insulin 5 times a day.  100 each  11  . Lancets (ACCU-CHEK MULTICLIX) lancets Use up to 5 times a day to check blood sugar.  100 each  5  .  lisinopril-hydrochlorothiazide (PRINZIDE,ZESTORETIC) 20-12.5 MG per tablet TAKE 1 TABLET BY MOUTH DAILY  90 tablet  1  . metFORMIN (GLUCOPHAGE) 1000 MG tablet Take 1 tablet (1,000 mg total) by mouth 2 (two) times daily with a meal.  180 tablet  1  . omeprazole (PRILOSEC) 40 MG capsule TAKE ONE CAPSULE BY MOUTH DAILY  90 capsule  0  . polyethylene glycol (MIRALAX / GLYCOLAX) packet Take 17 g by mouth daily. 17 grams mixed with water two times a day.        No current facility-administered medications on file prior to visit.    BP 130/80  Pulse 68  Temp(Src) 97.9 F (36.6 C) (Oral)  Wt 242 lb (109.77 kg)    Objective:   Physical Exam  Constitutional: He is oriented to person, place, and time. He appears well-developed and well-nourished. No distress.  HENT:  Head: Normocephalic and atraumatic.  Cardiovascular: Normal rate, regular rhythm, normal heart sounds and intact distal pulses.   No murmur heard. Pulmonary/Chest: Effort normal and breath sounds normal. He has no wheezes.  Musculoskeletal: He exhibits no edema.  Right upper quadricep tenderness, pain elicited with leg extension  Neurological: He is alert and oriented to person, place, and time. No cranial nerve deficit.  Skin: Skin is warm and dry.  Psychiatric: He has a normal mood and affect. His behavior is normal.          Assessment & Plan:

## 2013-08-28 NOTE — Patient Instructions (Addendum)
Please complete the following lab tests before your next follow up appointment: BMET, A1c - 250.02 FLP, LFTs, CPK - 272.4

## 2013-08-28 NOTE — Assessment & Plan Note (Signed)
Hold PT until right thigh pain resolved.

## 2013-08-28 NOTE — Assessment & Plan Note (Signed)
Monitor A1c before next OV.

## 2013-08-29 ENCOUNTER — Other Ambulatory Visit: Payer: Self-pay | Admitting: Internal Medicine

## 2013-09-03 ENCOUNTER — Other Ambulatory Visit: Payer: Self-pay | Admitting: Internal Medicine

## 2013-09-03 DIAGNOSIS — IMO0002 Reserved for concepts with insufficient information to code with codable children: Secondary | ICD-10-CM | POA: Diagnosis not present

## 2013-09-03 DIAGNOSIS — M48061 Spinal stenosis, lumbar region without neurogenic claudication: Secondary | ICD-10-CM | POA: Diagnosis not present

## 2013-09-03 DIAGNOSIS — M5126 Other intervertebral disc displacement, lumbar region: Secondary | ICD-10-CM | POA: Diagnosis not present

## 2013-09-03 DIAGNOSIS — M6281 Muscle weakness (generalized): Secondary | ICD-10-CM | POA: Diagnosis not present

## 2013-09-08 DIAGNOSIS — T148 Other injury of unspecified body region: Secondary | ICD-10-CM | POA: Diagnosis not present

## 2013-09-08 DIAGNOSIS — W57XXXA Bitten or stung by nonvenomous insect and other nonvenomous arthropods, initial encounter: Secondary | ICD-10-CM | POA: Diagnosis not present

## 2013-09-08 DIAGNOSIS — M25559 Pain in unspecified hip: Secondary | ICD-10-CM | POA: Diagnosis not present

## 2013-10-07 ENCOUNTER — Other Ambulatory Visit: Payer: Medicare Other

## 2013-10-12 DIAGNOSIS — S92919A Unspecified fracture of unspecified toe(s), initial encounter for closed fracture: Secondary | ICD-10-CM | POA: Diagnosis not present

## 2013-10-14 ENCOUNTER — Ambulatory Visit: Payer: Medicare Other | Admitting: Internal Medicine

## 2013-10-16 ENCOUNTER — Telehealth: Payer: Self-pay | Admitting: *Deleted

## 2013-10-16 DIAGNOSIS — E1149 Type 2 diabetes mellitus with other diabetic neurological complication: Secondary | ICD-10-CM

## 2013-10-16 NOTE — Telephone Encounter (Signed)
Appts already scheduled, 8/7(lab) & 8/14(ov)

## 2013-10-16 NOTE — Telephone Encounter (Signed)
Left message on machine for patient to schedule an appointment to follow up with diabetes A1c, bmet, micro albumin Diabetic bundle 

## 2013-10-23 ENCOUNTER — Other Ambulatory Visit: Payer: Medicare Other

## 2013-10-23 ENCOUNTER — Other Ambulatory Visit (INDEPENDENT_AMBULATORY_CARE_PROVIDER_SITE_OTHER): Payer: Medicare Other

## 2013-10-23 DIAGNOSIS — E785 Hyperlipidemia, unspecified: Secondary | ICD-10-CM | POA: Diagnosis not present

## 2013-10-23 DIAGNOSIS — E1149 Type 2 diabetes mellitus with other diabetic neurological complication: Secondary | ICD-10-CM | POA: Diagnosis not present

## 2013-10-23 LAB — HEPATIC FUNCTION PANEL
ALT: 17 U/L (ref 0–53)
AST: 24 U/L (ref 0–37)
Albumin: 3.7 g/dL (ref 3.5–5.2)
Alkaline Phosphatase: 81 U/L (ref 39–117)
Bilirubin, Direct: 0 mg/dL (ref 0.0–0.3)
Total Bilirubin: 0.6 mg/dL (ref 0.2–1.2)
Total Protein: 6.4 g/dL (ref 6.0–8.3)

## 2013-10-23 LAB — LIPID PANEL
Cholesterol: 134 mg/dL (ref 0–200)
HDL: 40.7 mg/dL (ref >39.00)
LDL Cholesterol: 75 mg/dL (ref 0–99)
NonHDL: 93.3
Total CHOL/HDL Ratio: 3
Triglycerides: 92 mg/dL (ref 0.0–149.0)
VLDL: 18.4 mg/dL (ref 0.0–40.0)

## 2013-10-23 LAB — MICROALBUMIN / CREATININE URINE RATIO
Creatinine,U: 308.7 mg/dL
Microalb Creat Ratio: 1.2 mg/g (ref 0.0–30.0)
Microalb, Ur: 3.6 mg/dL — ABNORMAL HIGH (ref 0.0–1.9)

## 2013-10-23 LAB — BASIC METABOLIC PANEL
BUN: 21 mg/dL (ref 6–23)
CO2: 26 mEq/L (ref 19–32)
Calcium: 9.2 mg/dL (ref 8.4–10.5)
Chloride: 105 mEq/L (ref 96–112)
Creatinine, Ser: 1.1 mg/dL (ref 0.4–1.5)
GFR: 72.75 mL/min (ref >60.00)
Glucose, Bld: 212 mg/dL — ABNORMAL HIGH (ref 70–99)
Potassium: 4.4 mEq/L (ref 3.5–5.1)
Sodium: 141 mEq/L (ref 135–145)

## 2013-10-23 LAB — HEMOGLOBIN A1C: Hgb A1c MFr Bld: 9 % — ABNORMAL HIGH (ref 4.6–6.5)

## 2013-10-26 DIAGNOSIS — M5137 Other intervertebral disc degeneration, lumbosacral region: Secondary | ICD-10-CM | POA: Diagnosis not present

## 2013-10-26 DIAGNOSIS — M48061 Spinal stenosis, lumbar region without neurogenic claudication: Secondary | ICD-10-CM | POA: Diagnosis not present

## 2013-10-28 ENCOUNTER — Ambulatory Visit: Payer: Medicare Other | Admitting: Internal Medicine

## 2013-10-30 ENCOUNTER — Ambulatory Visit: Payer: Medicare Other | Admitting: Internal Medicine

## 2013-11-02 ENCOUNTER — Other Ambulatory Visit: Payer: Self-pay | Admitting: Internal Medicine

## 2013-11-02 DIAGNOSIS — M545 Low back pain, unspecified: Secondary | ICD-10-CM | POA: Diagnosis not present

## 2013-11-02 DIAGNOSIS — M48061 Spinal stenosis, lumbar region without neurogenic claudication: Secondary | ICD-10-CM | POA: Diagnosis not present

## 2013-11-02 DIAGNOSIS — M5137 Other intervertebral disc degeneration, lumbosacral region: Secondary | ICD-10-CM | POA: Diagnosis not present

## 2013-11-04 DIAGNOSIS — M545 Low back pain, unspecified: Secondary | ICD-10-CM | POA: Diagnosis not present

## 2013-11-04 DIAGNOSIS — M5137 Other intervertebral disc degeneration, lumbosacral region: Secondary | ICD-10-CM | POA: Diagnosis not present

## 2013-11-04 DIAGNOSIS — M48061 Spinal stenosis, lumbar region without neurogenic claudication: Secondary | ICD-10-CM | POA: Diagnosis not present

## 2013-11-06 DIAGNOSIS — M48061 Spinal stenosis, lumbar region without neurogenic claudication: Secondary | ICD-10-CM | POA: Diagnosis not present

## 2013-11-06 DIAGNOSIS — M545 Low back pain, unspecified: Secondary | ICD-10-CM | POA: Diagnosis not present

## 2013-11-06 DIAGNOSIS — M5137 Other intervertebral disc degeneration, lumbosacral region: Secondary | ICD-10-CM | POA: Diagnosis not present

## 2013-11-10 ENCOUNTER — Ambulatory Visit (INDEPENDENT_AMBULATORY_CARE_PROVIDER_SITE_OTHER): Payer: Medicare Other | Admitting: Internal Medicine

## 2013-11-10 ENCOUNTER — Encounter: Payer: Self-pay | Admitting: Internal Medicine

## 2013-11-10 VITALS — BP 124/70 | HR 68 | Temp 98.7°F | Ht 74.0 in | Wt 238.0 lb

## 2013-11-10 DIAGNOSIS — I10 Essential (primary) hypertension: Secondary | ICD-10-CM | POA: Diagnosis not present

## 2013-11-10 DIAGNOSIS — E1149 Type 2 diabetes mellitus with other diabetic neurological complication: Secondary | ICD-10-CM | POA: Diagnosis not present

## 2013-11-10 DIAGNOSIS — R269 Unspecified abnormalities of gait and mobility: Secondary | ICD-10-CM | POA: Diagnosis not present

## 2013-11-10 DIAGNOSIS — E785 Hyperlipidemia, unspecified: Secondary | ICD-10-CM

## 2013-11-10 DIAGNOSIS — H6121 Impacted cerumen, right ear: Secondary | ICD-10-CM

## 2013-11-10 DIAGNOSIS — H6120 Impacted cerumen, unspecified ear: Secondary | ICD-10-CM | POA: Diagnosis not present

## 2013-11-10 MED ORDER — INSULIN ASPART 100 UNIT/ML FLEXPEN: PEN_INJECTOR | SUBCUTANEOUS | Status: DC

## 2013-11-10 MED ORDER — INSULIN GLARGINE 100 UNIT/ML SOLOSTAR PEN: PEN_INJECTOR | SUBCUTANEOUS | Status: DC

## 2013-11-10 MED ORDER — INSULIN GLARGINE 300 UNIT/ML ~~LOC~~ SOPN: 22.0000 [IU] | PEN_INJECTOR | Freq: Every day | SUBCUTANEOUS | Status: DC

## 2013-11-10 NOTE — Assessment & Plan Note (Signed)
Samples of insulin provided.  He needs help with adjusting mealtime insulin dose.  Arrange consultation with Cristy Folks. Lab Results  Component Value Date   HGBA1C 9.0* 10/23/2013   HGBA1C 8.5* 06/26/2013   HGBA1C 8.5* 02/10/2013   Lab Results  Component Value Date   MICROALBUR 3.6* 10/23/2013   LDLCALC 75 10/23/2013   CREATININE 1.1 10/23/2013

## 2013-11-10 NOTE — Assessment & Plan Note (Signed)
Acceptable.  Continue max dose of atorvastatin. Lab Results  Component Value Date   CHOL 134 10/23/2013   HDL 40.70 10/23/2013   LDLCALC 75 10/23/2013   TRIG 92.0 10/23/2013   CHOLHDL 3 10/23/2013   Lab Results  Component Value Date   ALT 17 10/23/2013   AST 24 10/23/2013   ALKPHOS 81 10/23/2013   BILITOT 0.6 10/23/2013

## 2013-11-10 NOTE — Progress Notes (Signed)
Pre visit review using our clinic review tool, if applicable. No additional management support is needed unless otherwise documented below in the visit note. 

## 2013-11-10 NOTE — Assessment & Plan Note (Signed)
MRI of lumbar spine showed bilateral foraminal stenosis, spondylosis and significant spinal stenosis. Unclear whether his symptoms secondary to diabetic polyneuropathy versus lumbar spinal stenosis. Patient scheduled to have nerve conduction study. I agree he should procee. Patient to also consider second opinion from neurosurgeon.  I recommended against using epidural steroid injection for now as his diabetes is poorly controlled and unclear benefit.

## 2013-11-10 NOTE — Assessment & Plan Note (Signed)
Well controlled.  Continue same medication regimen. BP: 124/70 mmHg

## 2013-11-10 NOTE — Progress Notes (Signed)
Subjective:    Patient ID: Luis Yu, male    DOB: 04-Jul-1945, 68 y.o.   MRN: [id number]  HPI  68 year old white male with history of type 2 diabetes, coronary artery disease and decreased ability for followup. Interval medical history-patient underwent evaluation by orthopedic specialist. It included MRI of lumbar spine. It showed severe spondylosis and anterolisthesis. Patient has significant bilateral foraminal stenosis between L4 and L5.  Patient scheduled to undergo nerve conduction studies to differentiate whether patient's lower extremity pain and gait instability secondary to spinal stenosis versus chronic diabetic nephropathy.  Patient's considering epidural steroid injection.  Type 2 diabetes-his blood sugar control is worse. His A1c now 9.0. Patient checks his fasting blood sugars. They're usually in the 120s to 160s. He tries to use his mealtime insulin when he remembers. He is accompanied by his supportive wife who tries to help him. He usually uses 15 units of Apidra before meals.  Patient in medicare "donut hole"  and prescription for Lantus and Apidra are cost prohibitive.  Hypertension-stable   Review of Systems Right-sided back pain and thigh pain, unsteady gait, no bowel or bladder incontinence   Past Medical History  Diagnosis Date  . Depression   . Hypertension   . Hyperlipidemia   . GERD (gastroesophageal reflux disease)   . CAD (coronary artery disease)   . Osteoarthritis   . Postherpetic neuralgia   . Herpes zoster   . Adjustment disorder with mixed emotional features   . Diabetic peripheral neuropathy   . Erectile dysfunction   . Obesity   . Diabetes mellitus type II, uncontrolled     History   Social History  . Marital Status: Married    Spouse Name: [name]    Number of Children: 1  . Years of Education: 13   Occupational History  . Retired Naval architect    Social History Main Topics  . Smoking status: Never Smoker   . Smokeless  tobacco: Never Used  . Alcohol Use: 1.0 oz/week    2 drink(s) per week     Comment: twice a month.  . Drug Use: No  . Sexual Activity: Yes    Partners: Female   Other Topics Concern  . Not on file   Social History Narrative   Married [name])   Regular exercise: none   Caffeine use: cup of coffee daily    Retired    Caffeine- twice daily             Past Surgical History  Procedure Laterality Date  . Coronary artery bypass graft  1996    eith a LIMA to the LAD, saphenous vein graft to the acute marginal, saphenous vein graft to the PDA and saphenous vein graft to the circumflex.  . Knee arthroscopy  05/2004    right knee  . Replacement total knee bilateral    . Shoulder surgery      Family History  Problem Relation Age of Onset  . Coronary artery disease Mother     deceased at 80  . Coronary artery disease Father     alive  . Stroke      half brother  . Stroke Brother     No Known Allergies  Current Outpatient Prescriptions on File Prior to Visit  Medication Sig Dispense Refill  . aspirin 325 MG EC tablet Take 325 mg by mouth daily.        [name] atorvastatin (LIPITOR) 80 MG tablet TAKE 1 TABLET BY MOUTH EVERY  DAY  90 tablet  1  . buPROPion (WELLBUTRIN XL) 300 MG 24 hr tablet TAKE 1 TABLET BY MOUTH EVERY DAY  90 tablet  2  . BYSTOLIC 5 MG tablet TAKE 1 TABLET BY MOUTH EVERY DAY  90 tablet  0  . Cyanocobalamin (B-12) 5000 MCG SUBL Place 1 tablet under the tongue once a week.      . fluticasone (FLONASE) 50 MCG/ACT nasal spray Place 2 sprays into both nostrils daily.      [name] gabapentin (NEURONTIN) 300 MG capsule Take 1 capsule (300 mg total) by mouth at bedtime as needed. For neuropathy.  90 capsule  3  . glucose blood (ACCU-CHEK ACTIVE STRIPS) test strip Use as instructed  100 each  12  . Insulin Pen Needle (PEN NEEDLES 31GX5/16") 31G X 8 MM MISC Use to inject insulin 5 times a day.  100 each  11  . Lancets (ACCU-CHEK MULTICLIX) lancets Use up to 5 times a day to check  blood sugar.  100 each  5  . lisinopril-hydrochlorothiazide (PRINZIDE,ZESTORETIC) 20-12.5 MG per tablet TAKE 1 TABLET BY MOUTH EVERY DAY  90 tablet  1  . metFORMIN (GLUCOPHAGE) 1000 MG tablet Take 1 tablet (1,000 mg total) by mouth 2 (two) times daily with a meal.  180 tablet  1  . omeprazole (PRILOSEC) 40 MG capsule TAKE ONE CAPSULE BY MOUTH DAILY  90 capsule  2  . polyethylene glycol (MIRALAX / GLYCOLAX) packet Take 17 g by mouth daily. 17 grams mixed with water two times a day.        No current facility-administered medications on file prior to visit.    BP 124/70  Pulse 68  Temp(Src) 98.7 F (37.1 C) (Oral)  Ht  (1.88 m)  Wt 238 lb (107.956 kg)  BMI 30.54 kg/m2      Objective:   Physical Exam  Constitutional: He is oriented to person, place, and time. He appears well-developed and well-nourished.  HENT:  Head: Normocephalic and atraumatic.  Right cerumen impaction  Neck: Neck supple.  Cardiovascular: Normal rate, regular rhythm and normal heart sounds.   No murmur heard. Pulmonary/Chest: Effort normal. He has no wheezes.  Musculoskeletal: He exhibits no edema.  Lymphadenopathy:    He has no cervical adenopathy.  Neurological: He is alert and oriented to person, place, and time. No cranial nerve deficit.  Slight unsteady gait  Psychiatric: He has a normal mood and affect. His behavior is normal.          Assessment & Plan:

## 2013-11-10 NOTE — Assessment & Plan Note (Signed)
Irrigated and utilized curette to remove cerumen plug from right ear.  No complications.

## 2013-11-12 ENCOUNTER — Ambulatory Visit (INDEPENDENT_AMBULATORY_CARE_PROVIDER_SITE_OTHER): Payer: Medicare Other | Admitting: Podiatrist

## 2013-11-12 DIAGNOSIS — M79609 Pain in unspecified limb: Secondary | ICD-10-CM

## 2013-11-12 DIAGNOSIS — B351 Tinea unguium: Secondary | ICD-10-CM | POA: Diagnosis not present

## 2013-11-12 DIAGNOSIS — E1140 Type 2 diabetes mellitus with diabetic neuropathy, unspecified: Secondary | ICD-10-CM

## 2013-11-12 DIAGNOSIS — E1149 Type 2 diabetes mellitus with other diabetic neurological complication: Secondary | ICD-10-CM | POA: Diagnosis not present

## 2013-11-12 DIAGNOSIS — E1142 Type 2 diabetes mellitus with diabetic polyneuropathy: Secondary | ICD-10-CM

## 2013-11-12 NOTE — Progress Notes (Signed)
   Subjective:    Patient ID: Luis Yu, male    DOB: 04/10/1945, 68 y.o.   MRN: [id number]  HPI Toenails trim.   Review of Systems  All other systems reviewed and are negative.      Objective:   Physical Exam Patient is awake, alert, and oriented x 3.  In no acute distress.  Vascular status is intact with palpable pedal pulses at 2/4 DP and PT bilateral and capillary refill time within normal limits. Neurological sensation is decreased bilaterally via Semmes Weinstein monofilament at 1/5 sites. Light touch, vibratory sensation decreased.  Dermatological exam reveals skin color, turger and texture as normal. No open lesions present.  Toenails thick, discolored, discolored x 10.  Previous broken toe of the right fifth digit present.  Musculature intact with dorsiflexion, plantarflexion, inversion, eversion.       Assessment & Plan:  Diabetes, neuropathy, symptomatic toenails  Plan:  Debridement of toenails are accomplished without complication.  Recommended diabetic shoes due to his neuropathy and risk for ulcer development.  Will contact his primary care doctor for approval of the shoes.   

## 2013-11-12 NOTE — Patient Instructions (Addendum)
DIABETIC SHOES-  Diabetic shoes and accomidative inserts are indicated for those persons who have Diabetes and who are at an increased for a foot ulceration.  This requires patients to have decreased feeling in the feet, circulation problems, as well as an abnormal foot structure, or a combination of these disorders.  As podiatrists, we can recommend a diabetic shoe to your primary care doctor or endocrinologist HOWEVER, it is up to them to decide if you are eligible or in need of these shoes.  Before we can take measurements or do the ordering of a diabetic shoe and accomidative insert, it is required that we get a signed authorization from your physician both confirming you have Diabetes and that you are at risk for an ulceration.  Please note, if you physician will not sign for the shoes, there is no way we can go around him or her.  You are welcome to place an order for the shoes, however the financial responsibility is up to you and your insurance cannot be billed.   Once, approval is granted for the diabetic shoes and inserts we will make you a separate appointment to be measured, casted, and for you to choose the diabetic shoe.  We will then call you when they are ready for dispensing and you will be seen to make sure the shoes and inserts fit your foot properly.  As this is a multi-step process, it generally takes 4-8 weeks from start to finish.  You cooperation is appreciated.  If it has been more than 6 weeks from the date you ordered your diabetic shoe and you have not heard from our office, please give us a call.      Diabetes and Foot Care Diabetes may cause you to have problems because of poor blood supply (circulation) to your feet and legs. This may cause the skin on your feet to become thinner, break easier, and heal more slowly. Your skin may become dry, and the skin may peel and crack. You may also have nerve damage in your legs and feet causing decreased feeling in them. You may not  notice minor injuries to your feet that could lead to infections or more serious problems. Taking care of your feet is one of the most important things you can do for yourself.  HOME CARE INSTRUCTIONS  Wear shoes at all times, even in the house. Do not go barefoot. Bare feet are easily injured.  Check your feet daily for blisters, cuts, and redness. If you cannot see the bottom of your feet, use a mirror or ask someone for help.  Wash your feet with warm water (do not use hot water) and mild soap. Then pat your feet and the areas between your toes until they are completely dry. Do not soak your feet as this can dry your skin.  Apply a moisturizing lotion or petroleum jelly (that does not contain alcohol and is unscented) to the skin on your feet and to dry, brittle toenails. Do not apply lotion between your toes.  Trim your toenails straight across. Do not dig under them or around the cuticle. File the edges of your nails with an emery board or nail file.  Do not cut corns or calluses or try to remove them with medicine.  Wear clean socks or stockings every day. Make sure they are not too tight. Do not wear knee-high stockings since they may decrease blood flow to your legs.  Wear shoes that fit properly and have enough   cushioning. To break in new shoes, wear them for just a few hours a day. This prevents you from injuring your feet. Always look in your shoes before you put them on to be sure there are no objects inside.  Do not cross your legs. This may decrease the blood flow to your feet.  If you find a minor scrape, cut, or break in the skin on your feet, keep it and the skin around it clean and dry. These areas may be cleansed with mild soap and water. Do not cleanse the area with peroxide, alcohol, or iodine.  When you remove an adhesive bandage, be sure not to damage the skin around it.  If you have a wound, look at it several times a day to make sure it is healing.  Do not use  heating pads or hot water bottles. They may burn your skin. If you have lost feeling in your feet or legs, you may not know it is happening until it is too late.  Make sure your health care provider performs a complete foot exam at least annually or more often if you have foot problems. Report any cuts, sores, or bruises to your health care provider immediately. SEEK MEDICAL CARE IF:   You have an injury that is not healing.  You have cuts or breaks in the skin.  You have an ingrown nail.  You notice redness on your legs or feet.  You feel burning or tingling in your legs or feet.  You have pain or cramps in your legs and feet.  Your legs or feet are numb.  Your feet always feel cold. SEEK IMMEDIATE MEDICAL CARE IF:   There is increasing redness, swelling, or pain in or around a wound.  There is a red line that goes up your leg.  Pus is coming from a wound.  You develop a fever or as directed by your health care provider.  You notice a bad smell coming from an ulcer or wound. Document Released: 03/02/2000 Document Revised: 11/05/2012 Document Reviewed: 08/12/2012 ExitCare Patient Information 2015 [employer]. This information is not intended to replace advice given to you by your health care provider. Make sure you discuss any questions you have with your health care provider.  

## 2013-11-13 DIAGNOSIS — M545 Low back pain, unspecified: Secondary | ICD-10-CM | POA: Diagnosis not present

## 2013-11-13 DIAGNOSIS — M48061 Spinal stenosis, lumbar region without neurogenic claudication: Secondary | ICD-10-CM | POA: Diagnosis not present

## 2013-11-13 DIAGNOSIS — M5137 Other intervertebral disc degeneration, lumbosacral region: Secondary | ICD-10-CM | POA: Diagnosis not present

## 2013-11-16 DIAGNOSIS — M5137 Other intervertebral disc degeneration, lumbosacral region: Secondary | ICD-10-CM | POA: Diagnosis not present

## 2013-11-16 DIAGNOSIS — M545 Low back pain, unspecified: Secondary | ICD-10-CM | POA: Diagnosis not present

## 2013-11-16 DIAGNOSIS — M48061 Spinal stenosis, lumbar region without neurogenic claudication: Secondary | ICD-10-CM | POA: Diagnosis not present

## 2013-11-18 DIAGNOSIS — M48061 Spinal stenosis, lumbar region without neurogenic claudication: Secondary | ICD-10-CM | POA: Diagnosis not present

## 2013-11-18 DIAGNOSIS — M545 Low back pain, unspecified: Secondary | ICD-10-CM | POA: Diagnosis not present

## 2013-11-18 DIAGNOSIS — M5137 Other intervertebral disc degeneration, lumbosacral region: Secondary | ICD-10-CM | POA: Diagnosis not present

## 2013-11-19 ENCOUNTER — Encounter: Payer: Medicare Other | Attending: Internal Medicine | Admitting: Dietician

## 2013-11-19 ENCOUNTER — Encounter: Payer: Self-pay | Admitting: Dietician

## 2013-11-19 VITALS — Ht 74.0 in | Wt 236.5 lb

## 2013-11-19 DIAGNOSIS — E1149 Type 2 diabetes mellitus with other diabetic neurological complication: Secondary | ICD-10-CM | POA: Diagnosis not present

## 2013-11-19 DIAGNOSIS — Z713 Dietary counseling and surveillance: Secondary | ICD-10-CM | POA: Insufficient documentation

## 2013-11-19 DIAGNOSIS — Z794 Long term (current) use of insulin: Secondary | ICD-10-CM | POA: Insufficient documentation

## 2013-11-19 NOTE — Patient Instructions (Signed)
Goals:  Follow Diabetes Meal Plan as instructed  Eat 3 meals and 2 snacks, every 3-5 hrs  Have protein the size of the palm of your hand  Add lean protein foods to meals/snacks  Monitor glucose levels as instructed by your doctor  Bring food record and glucose log to your next nutrition visit  Limit milk to 1-2 glasses per day  Exercise as tolerated

## 2013-11-19 NOTE — Progress Notes (Signed)
Appt start time: 1415 end time:  1515.  Assessment:  Patient was seen on  11/19/2013 for individual diabetes education. Luis Yu is here today since Dr. Artist Pais is here his Hgb A1c has gone up recently. Wife is at appointment today and states that he overeats. Don asked that his wife answer most of the questions since he as trouble hearing. Wife stated that Luis Yu has ADD and trouble focusing. Wife states that his "memory is gone" and Luis Yu says he cannot remember things.   Testing blood sugar every morning and averaging 135-150 mg/dl. Feeling "wobbly" below 140 and states that Dr. Artist Pais wants him to stay between 140-150 mg. Feels really bad if he is below 100 mg/dl. Does not write down blood sugar.   Wife does the meal planning at home. Skips at least 6 meals per week and eats about 5 x month. Stays up late and sleeps in.  Patient Education Plan per assessed needs and concerns is to attend individual session for Diabetes Self Management Education.  Current HbA1c: 9.0% on 10/23/2013  Preferred Learning Style:   Auditory  Visual  Hands on  No preference indicated   Learning Readiness:   Not ready  Contemplating  Ready  Change in progress  MEDICATIONS:metformin, Lantus, Apidra  DIETARY INTAKE:  Usual eating pattern includes 2-3 meals and 2 snacks per day.  Avoided foods include: none  24-hr recall:  B (10 AM): sausage/eggs or oatmeal, grits with coffee or juice  Snk ( AM): none  L (2-3 PM): leftovers or sandwich or skips Snk ( PM): sweets D ( PM): country style steak, rice, green beans Snk ( PM): sweets Beverages: coffee, juice, 2% milk (1 gallon in 3 days)  Usual physical activity: aquatic therapy 3 x week, can't exercise much since he gets tired and has poor balance  Estimated energy needs: 2000 calories 225 g carbohydrates 150 g protein 56 g fat  Progress Towards Goal(s):  In progress.   Nutritional Diagnosis:  Luis Yu-2.1 Inpaired nutrition utilization As related to glucose  metabolism.  As evidenced by Hgb A1c of 9.0%.    Intervention:  Nutrition counseling provided.  Discussed diabetes disease process and treatment options.  Discussed physiology of diabetes and role of obesity on insulin resistance.  Encouraged moderate weight reduction to improve glucose levels.  Discussed role of medications and diet in glucose control  Provided education on macronutrients on glucose levels.  Provided education on carb counting, importance of regularly scheduled meals/snacks, and meal planning  Discussed effects of physical activity on glucose levels and long-term glucose control.  Recommended 150 minutes of physical activity/week.  Discussed blood glucose monitoring and interpretation.  Discussed recommended target ranges and individual ranges.    Described short-term complications: hyper- and hypo-glycemia.  Discussed causes,symptoms, and treatment options.  Discussed prevention, detection, and treatment of long-term complications.  Discussed the role of prolonged elevated glucose levels on body systems.   Information to be covered at next appointment:   Discussed role of stress on blood glucose levels and discussed strategies to manage psychosocial issues.  Discussed recommendations for long-term diabetes self-care.  Established checklist for medical, dental, and emotional self-care.  Reviewed patient medications.  Discussed role of medication on blood glucose and possible side effects  Teaching Method Utilized:  Visual Auditory Hands on  Handouts given during visit include:  Living Well With Diabetes  Yellow Card  15 g CHO Snacks  MyPlate Handout  Barriers to learning/adherence to lifestyle change: memory problem, other chronic conditions, unable to  exercise regularly  Diabetes self-care support plan:   [facility] support group  wife  Demonstrated degree of understanding via:  Teach Back   Monitoring/Evaluation:  Dietary intake, exercise, and body  weight in 6 week(s).

## 2013-12-07 ENCOUNTER — Encounter: Payer: Self-pay | Admitting: Internal Medicine

## 2013-12-08 ENCOUNTER — Ambulatory Visit: Payer: Medicare Other | Admitting: Internal Medicine

## 2013-12-14 ENCOUNTER — Telehealth: Payer: Self-pay | Admitting: *Deleted

## 2013-12-14 DIAGNOSIS — M48061 Spinal stenosis, lumbar region without neurogenic claudication: Secondary | ICD-10-CM | POA: Diagnosis not present

## 2013-12-14 NOTE — Telephone Encounter (Signed)
I'm calling to check on the status of his Diabetic shoes.  Make sure Dr. Corliss Blacker has sent over the prescription and whatever he might have to do through Medicare.  If you would give [place] a call.  We'd appreciate it.

## 2013-12-16 DIAGNOSIS — M545 Low back pain, unspecified: Secondary | ICD-10-CM | POA: Diagnosis not present

## 2013-12-16 DIAGNOSIS — M5137 Other intervertebral disc degeneration, lumbosacral region: Secondary | ICD-10-CM | POA: Diagnosis not present

## 2013-12-16 DIAGNOSIS — M48061 Spinal stenosis, lumbar region without neurogenic claudication: Secondary | ICD-10-CM | POA: Diagnosis not present

## 2013-12-18 NOTE — Telephone Encounter (Signed)
I called and left the patient a message.  I apologized to him for the delay in calling him back.  I was trying to research the matter.  I had Dr. Irving ShowsEgerton to fill out an order for me.  Dr. Artist PaisYoo should receive it either today or Monday.  We will call you once we get the authorization from Dr. Artist PaisYoo.  I apologized for any inconvenience this may have caused.

## 2013-12-21 ENCOUNTER — Telehealth: Payer: Self-pay | Admitting: Internal Medicine

## 2013-12-21 DIAGNOSIS — M5136 Other intervertebral disc degeneration, lumbar region: Secondary | ICD-10-CM | POA: Diagnosis not present

## 2013-12-21 DIAGNOSIS — M5410 Radiculopathy, site unspecified: Secondary | ICD-10-CM | POA: Diagnosis not present

## 2013-12-21 DIAGNOSIS — E0842 Diabetes mellitus due to underlying condition with diabetic polyneuropathy: Secondary | ICD-10-CM | POA: Diagnosis not present

## 2013-12-21 MED ORDER — INSULIN GLARGINE 300 UNIT/ML ~~LOC~~ SOPN: 22.0000 [IU] | PEN_INJECTOR | Freq: Every day | SUBCUTANEOUS | Status: DC

## 2013-12-21 NOTE — Telephone Encounter (Signed)
tujeo samples given, no novolog or apidra.  Pt already picked up

## 2013-12-21 NOTE — Telephone Encounter (Signed)
Pt was supposed to have a follow up with Dr Artist PaisYoo tomorrow and was r/s to 11/19. Wife would like to know if we have any insulin samples to get him through?

## 2013-12-22 ENCOUNTER — Ambulatory Visit: Payer: Medicare Other | Admitting: Internal Medicine

## 2014-01-25 ENCOUNTER — Ambulatory Visit: Payer: Medicare Other | Admitting: Dietician

## 2014-01-29 ENCOUNTER — Ambulatory Visit: Payer: Medicare Other

## 2014-01-29 ENCOUNTER — Ambulatory Visit: Payer: Medicare Other | Admitting: Podiatrist

## 2014-02-01 ENCOUNTER — Ambulatory Visit: Payer: Medicare Other | Admitting: Internal Medicine

## 2014-02-04 ENCOUNTER — Ambulatory Visit: Payer: Medicare Other | Admitting: Internal Medicine

## 2014-02-05 ENCOUNTER — Ambulatory Visit: Payer: Medicare Other | Admitting: Podiatry

## 2014-02-05 ENCOUNTER — Ambulatory Visit: Payer: Medicare Other

## 2014-02-08 ENCOUNTER — Ambulatory Visit (INDEPENDENT_AMBULATORY_CARE_PROVIDER_SITE_OTHER): Payer: Medicare Other | Admitting: Internal Medicine

## 2014-02-08 ENCOUNTER — Encounter: Payer: Self-pay | Admitting: Internal Medicine

## 2014-02-08 VITALS — BP 114/72 | HR 73 | Temp 98.0°F | Ht 74.0 in | Wt 232.0 lb

## 2014-02-08 DIAGNOSIS — R269 Unspecified abnormalities of gait and mobility: Secondary | ICD-10-CM

## 2014-02-08 DIAGNOSIS — B354 Tinea corporis: Secondary | ICD-10-CM

## 2014-02-08 DIAGNOSIS — I10 Essential (primary) hypertension: Secondary | ICD-10-CM | POA: Diagnosis not present

## 2014-02-08 DIAGNOSIS — Z23 Encounter for immunization: Secondary | ICD-10-CM | POA: Diagnosis not present

## 2014-02-08 DIAGNOSIS — E1149 Type 2 diabetes mellitus with other diabetic neurological complication: Secondary | ICD-10-CM

## 2014-02-08 LAB — HEMOGLOBIN A1C: Hgb A1c MFr Bld: 9.6 % — ABNORMAL HIGH (ref 4.6–6.5)

## 2014-02-08 MED ORDER — TERBINAFINE HCL 250 MG PO TABS: 250.0000 mg | ORAL_TABLET | Freq: Every day | ORAL | Status: DC

## 2014-02-08 MED ORDER — GABAPENTIN 300 MG PO CAPS: 300.0000 mg | ORAL_CAPSULE | Freq: Three times a day (TID) | ORAL | Status: DC

## 2014-02-08 NOTE — Progress Notes (Signed)
Subjective:    Patient ID: Luis Yu, male    DOB: 06-Jul-1945, 68 y.o.   MRN: [id number]  HPI  68 year old white male with history of coronary artery disease, type 2 diabetes and diabetic neuropathy for follow-up. Interval medical history patient evaluated by orthopedic specialist and he completed nerve conduction study. Patient's wife reports it showed combination of diabetic nephropathy and changes secondary to spinal stenosis.  Patient has persistent symptoms despite using gabapentin 300 mg at bedtime. He has used Lyrica in the past but it caused excessive somnolence.  Diabetes mellitus type 2-blood sugar still not well controlled.  His main issue is postprandial hyperglycemia. He was seen by Cristy FolksLinda Spagnola.  He has follow up appointment.  Patient also complains of refractory rash on left buttock. He has persistent symptoms despite using over-the-counter Lamisil for 2-3 weeks. Review of Systems Negative for chest pain.  No new lower extremity muscle weakness    Past Medical History  Diagnosis Date  . Depression   . Hypertension   . Hyperlipidemia   . GERD (gastroesophageal reflux disease)   . CAD (coronary artery disease)   . Osteoarthritis   . Postherpetic neuralgia   . Herpes zoster   . Adjustment disorder with mixed emotional features   . Diabetic peripheral neuropathy   . Erectile dysfunction   . Obesity   . Diabetes mellitus type II, uncontrolled     History   Social History  . Marital Status: Married    Spouse Name: [name]    Number of Children: 1  . Years of Education: 13   Occupational History  . Retired Naval architecttruck driver    Social History Main Topics  . Smoking status: Never Smoker   . Smokeless tobacco: Never Used  . Alcohol Use: 1.0 oz/week    2 drink(s) per week     Comment: twice a month.  . Drug Use: No  . Sexual Activity:    Partners: Female   Other Topics Concern  . Not on file   Social History Narrative   Married [name]([name])   Regular  exercise: none   Caffeine use: cup of coffee daily    Retired    Caffeine- twice daily             Past Surgical History  Procedure Laterality Date  . Coronary artery bypass graft  1996    eith a LIMA to the LAD, saphenous vein graft to the acute marginal, saphenous vein graft to the PDA and saphenous vein graft to the circumflex.  . Knee arthroscopy  05/2004    right knee  . Replacement total knee bilateral    . Shoulder surgery      Family History  Problem Relation Age of Onset  . Coronary artery disease Mother     deceased at 3275  . Coronary artery disease Father     alive  . Stroke      half brother  . Stroke Brother     No Known Allergies  Current Outpatient Prescriptions on File Prior to Visit  Medication Sig Dispense Refill  . aspirin 325 MG EC tablet Take 325 mg by mouth daily.      [name]. atorvastatin (LIPITOR) 80 MG tablet TAKE 1 TABLET BY MOUTH EVERY DAY 90 tablet 1  . buPROPion (WELLBUTRIN XL) 300 MG 24 hr tablet TAKE 1 TABLET BY MOUTH EVERY DAY 90 tablet 2  . BYSTOLIC 5 MG tablet TAKE 1 TABLET BY MOUTH EVERY DAY 90 tablet 0  .  Cyanocobalamin (B-12) 5000 MCG SUBL Place 1 tablet under the tongue once a week.    . fluticasone (FLONASE) 50 MCG/ACT nasal spray Place 2 sprays into both nostrils daily.    [name]. glucose blood (ACCU-CHEK ACTIVE STRIPS) test strip Use as instructed 100 each 12  . insulin aspart (NOVOLOG) 100 UNIT/ML FlexPen Use 15 to 30 units 15 minutes before meals 3 times daily 3 pen 0  . Insulin Glargine (LANTUS SOLOSTAR) 100 UNIT/ML Solostar Pen Use 22 units at bedtime 15 mL 5  . Insulin Glargine (TOUJEO SOLOSTAR) 300 UNIT/ML SOPN Inject 22 Units into the skin at bedtime. 2 pen 0  . Insulin Pen Needle (PEN NEEDLES 31GX5/16") 31G X 8 MM MISC Use to inject insulin 5 times a day. 100 each 11  . Lancets (ACCU-CHEK MULTICLIX) lancets Use up to 5 times a day to check blood sugar. 100 each 5  . lisinopril-hydrochlorothiazide (PRINZIDE,ZESTORETIC) 20-12.5 MG per  tablet TAKE 1 TABLET BY MOUTH EVERY DAY 90 tablet 1  . metFORMIN (GLUCOPHAGE) 1000 MG tablet Take 1 tablet (1,000 mg total) by mouth 2 (two) times daily with a meal. 180 tablet 1  . omeprazole (PRILOSEC) 40 MG capsule TAKE ONE CAPSULE BY MOUTH DAILY 90 capsule 2  . polyethylene glycol (MIRALAX / GLYCOLAX) packet Take 17 g by mouth daily. 17 grams mixed with water two times a day.      No current facility-administered medications on file prior to visit.    BP 114/72 mmHg  Pulse 73  Temp(Src) 98 F (36.7 C) (Oral)  Ht 6\' 2"  (1.88 m)  Wt 232 lb (105.235 kg)  BMI 29.77 kg/m2    Objective:   Physical Exam  Constitutional: He is oriented to person, place, and time. He appears well-developed and well-nourished. No distress.  HENT:  Head: Normocephalic and atraumatic.  Cardiovascular: Normal rate, regular rhythm and normal heart sounds.   Pulmonary/Chest: Effort normal and breath sounds normal.  Musculoskeletal: He exhibits no edema.  Neurological: He is alert and oriented to person, place, and time. No cranial nerve deficit.  Skin:  Annular erythematous lesion left buttock  Psychiatric: He has a normal mood and affect. His behavior is normal.          Assessment & Plan:

## 2014-02-08 NOTE — Assessment & Plan Note (Signed)
Continue to work with diabetic educator to optimize meal time insulin dosing.

## 2014-02-08 NOTE — Assessment & Plan Note (Signed)
Patient reports using over-the-counter antifungals for 2-3 weeks. No improvement in buttock rash. Treat with generic Lamisil 250 mg once daily for 1 week.

## 2014-02-08 NOTE — Assessment & Plan Note (Signed)
Patient completed EMG. Official copy and available for review. His wife reports EMG showed diabetic polyneuropathy and signs of spinal stenosis. Patient's symptoms may be secondary to diabetic amyotrophy.  Increase dose of gabapentin.  Patient advised to slowly increase from bid to tid over 2-4 weeks.

## 2014-02-08 NOTE — Assessment & Plan Note (Signed)
Stable. BP: 114/72 mmHg

## 2014-02-08 NOTE — Progress Notes (Signed)
Pre visit review using our clinic review tool, if applicable. No additional management support is needed unless otherwise documented below in the visit note. 

## 2014-02-09 ENCOUNTER — Ambulatory Visit (INDEPENDENT_AMBULATORY_CARE_PROVIDER_SITE_OTHER): Payer: Medicare Other | Admitting: Internal Medicine

## 2014-02-09 ENCOUNTER — Encounter: Payer: Self-pay | Admitting: Internal Medicine

## 2014-02-09 VITALS — BP 118/60 | HR 67 | Temp 98.1°F | Resp 12 | Wt 231.8 lb

## 2014-02-09 DIAGNOSIS — E1165 Type 2 diabetes mellitus with hyperglycemia: Secondary | ICD-10-CM | POA: Diagnosis not present

## 2014-02-09 DIAGNOSIS — E1139 Type 2 diabetes mellitus with other diabetic ophthalmic complication: Secondary | ICD-10-CM

## 2014-02-09 DIAGNOSIS — IMO0002 Reserved for concepts with insufficient information to code with codable children: Secondary | ICD-10-CM

## 2014-02-09 LAB — BASIC METABOLIC PANEL
BUN: 28 mg/dL — ABNORMAL HIGH (ref 6–23)
CO2: 27 mEq/L (ref 19–32)
Calcium: 9.1 mg/dL (ref 8.4–10.5)
Chloride: 97 mEq/L (ref 96–112)
Creatinine, Ser: 1.5 mg/dL (ref 0.4–1.5)
GFR: 49.61 mL/min — ABNORMAL LOW (ref >60.00)
Glucose, Bld: 388 mg/dL — ABNORMAL HIGH (ref 70–99)
Potassium: 4.3 mEq/L (ref 3.5–5.1)
Sodium: 134 mEq/L — ABNORMAL LOW (ref 135–145)

## 2014-02-09 MED ORDER — INSULIN GLARGINE 300 UNIT/ML ~~LOC~~ SOPN: 26.0000 [IU] | PEN_INJECTOR | Freq: Every day | SUBCUTANEOUS | Status: DC

## 2014-02-09 NOTE — Progress Notes (Signed)
Patient ID: Luis ImusDonald W Acevedo, male   DOB: [birth year], 68 y.o.   MRN: [id number]  HPI: Luis Yu is a 68 y.o.-year-old male, returning for DM2, dx 2004, insulin-dependent since 2009, uncontrolled, with  Complications (CAD - s/p [id number], PN, ED). He is here with his wife, who offers most of the history. Last visit 1 year and 2 mo ago!  He feels bad today >> cannot describe - almost fell en route to the clinic. Somnolent during the appt. He had an episode last week >> could have taken Gabapentin before the episode then.  He just started to take the Gabapentin 2x a day yesterday.   He had a MRSA skin infection >> tx with ABx.  Last hemoglobin A1c was: Lab Results  Component Value Date   HGBA1C 9.6* 02/08/2014   HGBA1C 9.0* 10/23/2013   HGBA1C 8.5* 06/26/2013  Prev. 8.7%.  Pt is on a regimen of: - Metformin 1000 >> 500 mg po bid - decreased today 2/2 Cr increase - Toujeo 22 units qhs - never misses this - pens - NovoLog 10-30 units tid ac - Dose depends on size of the meal. He might miss doses - pens  Pt checks his sugars 1-2x a day and they are: - am: 140-190 >> 130-150 - 2h after b'fast: 170-180 >> n/c - 2h after dinner: n/c - before dinner: 140-150 >> n/c - bedtime: 180-190 >> n/c  Has occasional lows. Lowest sugar was 87; he has hypoglycemia awareness at 100.  Highest sugar was 300s yesterday.  Pt's meals are (per today's check): - Breakfast: egg sandwich + coffee with cream and sugar; sometimes grits, oatmeal, cornflakes + 2% milk - Lunch: may skip or have a sandwich - Dinner: meat + potatoes or rice + vegetables/salads/corn - Snacks: cheese crackers, cake - grazes especially at night Usually has larger 2 meals a day. No sodas. Drinks diet juice/tea.  - + CKD, last BUN/creatinine:  Lab Results  Component Value Date   BUN 28* 02/08/2014   CREATININE 1.5 02/08/2014  he is on lisinopril - last set of lipids: Lab Results  Component Value Date   CHOL 134 10/23/2013   HDL  40.70 10/23/2013   LDLCALC 75 10/23/2013   TRIG 92.0 10/23/2013   CHOLHDL 3 10/23/2013  he is on atorvastatin. - last eye exam was in 05/2013 + DR OU. (Dr Dione BoozeGroat) - no numbness and tingling in his feet, but has burning pain at night (mostly toes)  Healso has a history of hypertension, hyperlipidemia, osteoarthritis (had bilateral knee replacement), GERD, depression, obesity, OSA - does not wear CPAP, history of herpes zoster with postherpetic neuralgia.  ROS: Constitutional: + weight loss, + decreased appetite, + fatigue, no subjective hyperthermia/hypothermia, nocturia Eyes: no blurry vision, no xerophthalmia ENT: no sore throat, no nodules palpated in throat, no dysphagia/odynophagia, no hoarseness, + hypoacusis Cardiovascular: no CP/SOB/palpitations/leg swelling Respiratory: no cough/SOB Gastrointestinal: no N/V/D/C Musculoskeletal: + muscle/+ joint aches Skin: no rashes Neurological: no tremors/numbness/tingling/dizziness  PE: BP 118/60 mmHg  Pulse 67  Temp(Src) 98.1 F (36.7 C) (Oral)  Resp 12  Wt 231 lb 12.8 oz (105.144 kg)  SpO2 97% Wt Readings from Last 3 Encounters:  02/09/14 231 lb 12.8 oz (105.144 kg)  02/08/14 232 lb (105.235 kg)  11/19/13 236 lb 8 oz (107.276 kg)   Constitutional: overweight, in NAD, falling asleep during exam Eyes: PERRLA, EOMI, no exophthalmos ENT: moist mucous membranes, no thyromegaly, no cervical lymphadenopathy Cardiovascular: RRR, No MRG Respiratory: CTA B Gastrointestinal: abdomen  soft, NT, ND, BS+ Musculoskeletal: no deformities, strength intact in all 4 Skin: moist, warm, no rashes Neurological: + tremor with outstretched hand  ASSESSMENT: 1. DM2, insulin-dependent, uncontrolled, with complications - CAD, status post CABG in 1996, status post PCI, with restenosis - Dr. Jens Somrenshaw - Peripheral neuropathy - ED  2. Somnolence  PLAN:  1. Patient with long-standing, uncontrolled diabetes, returning after a long absence. He is on  Metformin + basal-bolus regimen, having med noncompliance - unclear if she skips insulin or not, wife not at home during the day to watch him. He is too somnolent today to give me any info, all hx obtained from wife.  - We discussed about options for treatment, and I suggested to:  Patient Instructions  Please continue: - Metformin 500 mg 2x a day (decreased today after Cr returned at 1.5) Please increase: - Toujeo to 26 units at bedtime Please change NovoLog as follows: - 10-30 units depending on the size of the meal  Please return in 1 month with your sugar log.   - given new sugar logs and advised how to fill it and to bring it at next appt >> we absolutely need a log to be able to adjust his insulins further. - no labs today - advised for yearly eye exams ( he is up-to-date on this)  2. Somnolence - per hx from wife, this is pronounced after he increased Neurontin to bid. He took Neurontin at 12 pm and he tripped and fell when coming to the appt (scheduled at 4:30 pm) and is falling asleep throughout the visit. - advised to only take neurontin at night and be very careful about trips to the BR at night - however, tonight skip 2nd neurontin dose - also, if not better after neurontin out of the system, may need to go to the hospital - wife agrees to plan - he also has severe OSA and he has a CPAP at home >> not using >> advised to start using it

## 2014-02-09 NOTE — Patient Instructions (Signed)
Please continue: - Metformin 500 mg 2x a day Please increase: - Toujeo to 26 units at bedtime Please change NovoLog as follows: - 10-30 units depending on the size of the meal  Please return in 1 month with your sugar log.

## 2014-02-22 ENCOUNTER — Other Ambulatory Visit (INDEPENDENT_AMBULATORY_CARE_PROVIDER_SITE_OTHER): Payer: Medicare Other

## 2014-02-22 DIAGNOSIS — I10 Essential (primary) hypertension: Secondary | ICD-10-CM | POA: Diagnosis not present

## 2014-02-22 LAB — BASIC METABOLIC PANEL
BUN: 20 mg/dL (ref 6–23)
CO2: 28 mEq/L (ref 19–32)
Calcium: 9 mg/dL (ref 8.4–10.5)
Chloride: 104 mEq/L (ref 96–112)
Creatinine, Ser: 1.1 mg/dL (ref 0.4–1.5)
GFR: 71.15 mL/min (ref >60.00)
Glucose, Bld: 248 mg/dL — ABNORMAL HIGH (ref 70–99)
Potassium: 4.1 mEq/L (ref 3.5–5.1)
Sodium: 140 mEq/L (ref 135–145)

## 2014-02-24 ENCOUNTER — Encounter: Payer: Self-pay | Admitting: Podiatrist

## 2014-02-24 ENCOUNTER — Encounter: Payer: Self-pay | Admitting: Internal Medicine

## 2014-02-24 ENCOUNTER — Ambulatory Visit (INDEPENDENT_AMBULATORY_CARE_PROVIDER_SITE_OTHER): Payer: Medicare Other | Admitting: Internal Medicine

## 2014-02-24 ENCOUNTER — Ambulatory Visit (INDEPENDENT_AMBULATORY_CARE_PROVIDER_SITE_OTHER): Payer: Medicare Other | Admitting: Podiatrist

## 2014-02-24 VITALS — BP 144/84 | Temp 97.8°F | Ht 74.0 in | Wt 244.0 lb

## 2014-02-24 DIAGNOSIS — I10 Essential (primary) hypertension: Secondary | ICD-10-CM | POA: Diagnosis not present

## 2014-02-24 DIAGNOSIS — R270 Ataxia, unspecified: Secondary | ICD-10-CM | POA: Diagnosis not present

## 2014-02-24 DIAGNOSIS — R2681 Unsteadiness on feet: Secondary | ICD-10-CM

## 2014-02-24 DIAGNOSIS — B351 Tinea unguium: Secondary | ICD-10-CM

## 2014-02-24 DIAGNOSIS — E1140 Type 2 diabetes mellitus with diabetic neuropathy, unspecified: Secondary | ICD-10-CM

## 2014-02-24 DIAGNOSIS — R269 Unspecified abnormalities of gait and mobility: Secondary | ICD-10-CM

## 2014-02-24 DIAGNOSIS — R278 Other lack of coordination: Secondary | ICD-10-CM | POA: Insufficient documentation

## 2014-02-24 DIAGNOSIS — M79609 Pain in unspecified limb: Principal | ICD-10-CM

## 2014-02-24 MED ORDER — LISINOPRIL-HYDROCHLOROTHIAZIDE 10-12.5 MG PO TABS: 1.0000 | ORAL_TABLET | Freq: Every day | ORAL | Status: DC

## 2014-02-24 MED ORDER — GABAPENTIN 300 MG PO CAPS: 300.0000 mg | ORAL_CAPSULE | Freq: Three times a day (TID) | ORAL | Status: DC

## 2014-02-24 NOTE — Progress Notes (Signed)
Pre visit review using our clinic review tool, if applicable. No additional management support is needed unless otherwise documented below in the visit note. 

## 2014-02-24 NOTE — Assessment & Plan Note (Signed)
Patient may be poor candidate for pain management. Use of narcotics may exacerbate his gait instability and confusion. We discussed possible referral to neurosurgeon for epidural steroid injection.  Neurology input would be helpful.

## 2014-02-24 NOTE — Assessment & Plan Note (Signed)
Patient experienced transient rise in serum creatinine. I suspect this was secondary to significant hyperglycemia causing secondary dehydration. I stressed importance of compliance with mealtime insulin and lowering his blood sugars. Change to lisinopril hydrochlorothiazide dose to 10/2.5 mg vs 1/2 tab of 20/12.5.  Consider switch to ramipril.  BP: (!) 144/84 mmHg

## 2014-02-24 NOTE — Assessment & Plan Note (Addendum)
68 year old white male with multiple risk factors had transient episode of uncoordinated movements and confusion 02/09/2014.  Question possible TIA.  Refer to neurology for further evaluation.  Continue full dose aspirin and aggressive risk factor management.

## 2014-02-24 NOTE — Progress Notes (Signed)
   Subjective:    Patient ID: Luis Yu, male    DOB: 1945/09/29, 68 y.o.   MRN: [id number]  HPI Toenails trim.   Review of Systems  All other systems reviewed and are negative.      Objective:   Physical Exam Patient is awake, alert, and oriented x 3.  In no acute distress.  Vascular status is intact with palpable pedal pulses at 2/4 DP and PT bilateral and capillary refill time within normal limits. Neurological sensation is decreased bilaterally via Semmes Weinstein monofilament at 1/5 sites. Light touch, vibratory sensation decreased.  Dermatological exam reveals skin color, turger and texture as normal. No open lesions present.  Toenails thick, discolored, discolored x 10.  Previous broken toe of the right fifth digit present.  Musculature intact with dorsiflexion, plantarflexion, inversion, eversion.       Assessment & Plan:  Diabetes, neuropathy, symptomatic toenails  Plan:  Debridement of toenails are accomplished without complication.  Recommended diabetic shoes due to his neuropathy and risk for ulcer development.  Will contact his primary care doctor for approval of the shoes.

## 2014-02-24 NOTE — Progress Notes (Signed)
Subjective:    Patient ID: Luis Yu, male    DOB: Aug 09, 1945, 68 y.o.   MRN: [id number]  HPI  68 year old white male with history of uncontrolled type 2 diabetes, coronary artery disease and  Gait instability for follow-up. Interval medical history-patient seen by endocrinologist. Patient accompanied by supportive wife. He apparently had issues with somnolence and confusion during visit with endocrinologist.  He was taking gabapentin 300 mg twice daily as usual. His symptoms seem to last for less than 24 hours.  Wife reports he had mild difficulty with moving right side of body.  He was uncoordinated.  Patient's metformin dose was decreased to 500 mg twice daily. His serum creatinine rose to 1.5. After aggressive oral hydration and lowering his blood sugars his serum creatinine returned to baseline.  Patient's lisinopril hydrochlorothiazide dose was reduced by half.  Patient continues to struggle with right leg pain/back pain. He was referred to pain management-Dr. Philips.  Review of Systems Negative for hypoglycemia,  Unsteady gait.    Past Medical History  Diagnosis Date  . Depression   . Hypertension   . Hyperlipidemia   . GERD (gastroesophageal reflux disease)   . CAD (coronary artery disease)   . Osteoarthritis   . Postherpetic neuralgia   . Herpes zoster   . Adjustment disorder with mixed emotional features   . Diabetic peripheral neuropathy   . Erectile dysfunction   . Obesity   . Diabetes mellitus type II, uncontrolled     History   Social History  . Marital Status: Married    Spouse Name: [name]    Number of Children: 1  . Years of Education: 13   Occupational History  . Retired Naval architecttruck driver    Social History Main Topics  . Smoking status: Never Smoker   . Smokeless tobacco: Never Used  . Alcohol Use: 1.0 oz/week    2 drink(s) per week     Comment: twice a month.  . Drug Use: No  . Sexual Activity:    Partners: Female   Other Topics Concern  . Not  on file   Social History Narrative   Married [name]([name])   Regular exercise: none   Caffeine use: cup of coffee daily    Retired    Caffeine- twice daily             Past Surgical History  Procedure Laterality Date  . Coronary artery bypass graft  1996    eith a LIMA to the LAD, saphenous vein graft to the acute marginal, saphenous vein graft to the PDA and saphenous vein graft to the circumflex.  . Knee arthroscopy  05/2004    right knee  . Replacement total knee bilateral    . Shoulder surgery      Family History  Problem Relation Age of Onset  . Coronary artery disease Mother     deceased at 5275  . Coronary artery disease Father     alive  . Stroke      half brother  . Stroke Brother     No Known Allergies  Current Outpatient Prescriptions on File Prior to Visit  Medication Sig Dispense Refill  . aspirin 325 MG EC tablet Take 325 mg by mouth daily.      [name]. atorvastatin (LIPITOR) 80 MG tablet TAKE 1 TABLET BY MOUTH EVERY DAY 90 tablet 1  . buPROPion (WELLBUTRIN XL) 300 MG 24 hr tablet TAKE 1 TABLET BY MOUTH EVERY DAY 90 tablet 2  .  BYSTOLIC 5 MG tablet TAKE 1 TABLET BY MOUTH EVERY DAY 90 tablet 0  . Cyanocobalamin (B-12) 5000 MCG SUBL Place 1 tablet under the tongue once a week.    . fluticasone (FLONASE) 50 MCG/ACT nasal spray Place 2 sprays into both nostrils daily.    [name]. glucose blood (ACCU-CHEK ACTIVE STRIPS) test strip Use as instructed 100 each 12  . insulin aspart (NOVOLOG) 100 UNIT/ML FlexPen Use 15 to 30 units 15 minutes before meals 3 times daily 3 pen 0  . Insulin Glargine (TOUJEO SOLOSTAR) 300 UNIT/ML SOPN Inject 26 Units into the skin at bedtime. 5 pen 0  . Insulin Pen Needle (PEN NEEDLES 31GX5/16") 31G X 8 MM MISC Use to inject insulin 5 times a day. 100 each 11  . Lancets (ACCU-CHEK MULTICLIX) lancets Use up to 5 times a day to check blood sugar. 100 each 5  . metFORMIN (GLUCOPHAGE) 1000 MG tablet Take 1 tablet (1,000 mg total) by mouth 2 (two) times daily  with a meal. (Patient taking differently: Take 500 mg by mouth 2 (two) times daily with a meal. ) 180 tablet 1  . omeprazole (PRILOSEC) 40 MG capsule TAKE ONE CAPSULE BY MOUTH DAILY 90 capsule 2  . polyethylene glycol (MIRALAX / GLYCOLAX) packet Take 17 g by mouth daily. 17 grams mixed with water two times a day.      No current facility-administered medications on file prior to visit.    BP 144/84 mmHg  Temp(Src) 97.8 F (36.6 C) (Oral)  Ht 6\' 2"  (1.88 m)  Wt 244 lb (110.678 kg)  BMI 31.31 kg/m2    Objective:   Physical Exam  Constitutional: He is oriented to person, place, and time. He appears well-developed and well-nourished.  Cardiovascular: Normal rate, regular rhythm and normal heart sounds.   No murmur heard. Pulmonary/Chest: Effort normal and breath sounds normal. He has no wheezes.  Musculoskeletal: He exhibits no edema.  Neurological: He is alert and oriented to person, place, and time. No cranial nerve deficit.  Unstable gait  Skin: Skin is warm and dry.  Psychiatric: He has a normal mood and affect. His behavior is normal.          Assessment & Plan:

## 2014-03-03 ENCOUNTER — Ambulatory Visit: Payer: Medicare Other | Admitting: Podiatrist

## 2014-03-16 ENCOUNTER — Ambulatory Visit: Payer: Medicare Other | Admitting: Internal Medicine

## 2014-03-16 DIAGNOSIS — M4726 Other spondylosis with radiculopathy, lumbar region: Secondary | ICD-10-CM | POA: Diagnosis not present

## 2014-03-16 DIAGNOSIS — G894 Chronic pain syndrome: Secondary | ICD-10-CM | POA: Diagnosis not present

## 2014-03-16 DIAGNOSIS — E1142 Type 2 diabetes mellitus with diabetic polyneuropathy: Secondary | ICD-10-CM | POA: Diagnosis not present

## 2014-03-16 DIAGNOSIS — Z79891 Long term (current) use of opiate analgesic: Secondary | ICD-10-CM | POA: Diagnosis not present

## 2014-03-16 DIAGNOSIS — M47812 Spondylosis without myelopathy or radiculopathy, cervical region: Secondary | ICD-10-CM | POA: Diagnosis not present

## 2014-03-17 ENCOUNTER — Other Ambulatory Visit: Payer: Self-pay | Admitting: Internal Medicine

## 2014-03-18 ENCOUNTER — Ambulatory Visit: Payer: Medicare Other | Admitting: Internal Medicine

## 2014-03-20 ENCOUNTER — Other Ambulatory Visit: Payer: Self-pay | Admitting: Internal Medicine

## 2014-03-20 ENCOUNTER — Telehealth: Payer: Self-pay | Admitting: Internal Medicine

## 2014-03-22 ENCOUNTER — Telehealth: Payer: Self-pay | Admitting: Internal Medicine

## 2014-03-22 ENCOUNTER — Other Ambulatory Visit: Payer: Self-pay | Admitting: Internal Medicine

## 2014-03-22 MED ORDER — TERBINAFINE HCL 250 MG PO TABS: 250.0000 mg | ORAL_TABLET | Freq: Every day | ORAL | Status: DC

## 2014-03-22 MED ORDER — NEBIVOLOL HCL 5 MG PO TABS: 5.0000 mg | ORAL_TABLET | Freq: Every day | ORAL | Status: DC

## 2014-03-22 NOTE — Telephone Encounter (Signed)
rx for generic lamisil [facility] to [facility] [place])

## 2014-03-22 NOTE — Telephone Encounter (Signed)
PLEASE NOTE: All timestamps contained within this report are represented as [place] Standard Time. CONFIDENTIALTY NOTICE: This fax transmission is intended only for the addressee. It contains information that is legally privileged, confidential or otherwise protected from use or disclosure. If you are not the intended recipient, you are strictly prohibited from reviewing, disclosing, copying using or disseminating any of this information or taking any action in reliance on or regarding this information. If you have received this fax in error, please notify us immediately by telephone so that we can arrange for its return to [place]. Phone: [phone], Toll-Free: [phone], Fax: [phone] Page: 1 of 4 Call Id: [id number] [facility] - Client TELEPHONE ADVICE RECORD [facility] Patient Name: Luis Yu Gender: Male DOB: [birth year] Age: 69 Y 9 M 4 D Return Phone Number: [phone] (Primary), [phone] (Secondary) [place] [address] Client [facility] - Client Client Site [facility] - Night Physician Artist Pais, Rob Contact Type Call Call Type Triage / Clinical Caller Name [name] Relationship To Patient Spouse Return Phone Number [phone] (Primary) Chief Complaint BREATHING - shortness of breath or sounds breathless Initial Comment Caller states her husband is out of his BP medicine. Has not had medicine since Thursday. He is having shortness of breath, Nurse Assessment Nurse: Anner Crete, RN, Olegario Messier Date/Time (Eastern Time): 03/20/2014 1:10:11 PM Confirm and document reason for call. If symptomatic, describe symptoms. ---Caller states her husband is out of his BP medicine ( Bystolic ) . He had a prescription for it but cannot find it anywhere. He is totally out of his medication and gets short of breath at times when he has to bend over. He is not complaining of any SOB now  though. Has the patient traveled out of the country within the last 30 days? ---Not Applicable Does the patient require triage? ---No Please document clinical information provided and list any resource used. ---I advised caller that i would [facility] and call enough in to last until Monday when office reopens. Nurse: Anner Crete, RN, Olegario Messier Date/Time Lamount Cohen Time): 03/20/2014 2:03:08 PM Please select the assessment type ---Verbal order / New medication order Does the client directives allow for assistance with medications after hours? ---Yes Other current medications? ---Unknown Medication allergies? ---Unknown Pharmacy name and phone number. [id number] Does the client directive allow for RN to call in the medication order to the pharmacy? ---Yes Guidelines Guideline Title Affirmed Question Affirmed Notes Nurse Date/Time Lamount Cohen Time) PLEASE NOTE: All timestamps contained within this report are represented as [place] Standard Time. CONFIDENTIALTY NOTICE: This fax transmission is intended only for the addressee. It contains information that is legally privileged, confidential or otherwise protected from use or disclosure. If you are not the intended recipient, you are strictly prohibited from reviewing, disclosing, copying using or disseminating any of this information or taking any action in reliance on or regarding this information. If you have received this fax in error, please notify us immediately by telephone so that we can arrange for its return to [place]. Phone: [phone], Toll-Free: [phone], Fax: [phone] Page: 2 of 4 Call Id: [id number] Disp. Time Lamount Cohen Time) Disposition Final User 03/20/2014 1:06:51 PM Send to Urgent Queue Toppins, Dava 03/20/2014 1:33:52 PM Paged On Call back to [facility], RN, Olegario Messier 03/20/2014 1:37:47 PM Pharmacy Call Anner Crete, RN, Olegario Messier Reason: [phone] to call in a small  amount of Bystolic for this patient. Pharmacist  does not have a doctor by the name of Dr. Alfredo Batty and requests MD NPI number. She states that she has a Dr. Alfredo Batty in [place] or a Dr. Ethelene Hal here in [place]. I did not feel comfortable calling this med in at this point ( and no NPI number to confirm correct MD ) so i advised to hold this refill for now. Pharmacist verbalized understanding. 03/20/2014 1:39:41 PM Send To RN Personal Anner Crete, RN, Olegario Messier 03/20/2014 2:04:27 PM Pharmacy Call Anner Crete, RN, Olegario Messier Reason: Bystolic [facility] [phone] ) 03/20/2014 2:09:11 PM Send To RN Personal Anner Crete, RN, Olegario Messier 03/20/2014 2:58:40 PM Call Completed Anner Crete, RN, Olegario Messier 03/20/2014 1:34:04 PM Clinical Call Yes Anner Crete, RN, Olegario Messier After Care Instructions Given Call Event Type User Date / Time Description Verbal Orders/Maintenance Medications Medication Refill Route Dosage Regime Duration Admin Instructions User Name Bystolic 5 mg Oral once daily # 3 no refills Anner Crete, RN, Olegario Messier Comments User: Alphonzo Cruise, RN Date/Time (Eastern Time): 03/20/2014 1:39:20 PM I advised caller that I could not fill prescription at this time but advised her I would speak to the on call MD about this ( Dr. Earma Reading ) and get back with her soon. User: Alphonzo Cruise, RN Date/Time Lamount Cohen Time): 03/20/2014 2:08:41 PM advised caller that I had called in enough Bystolic for her husband for this weekend and to follow up with Dr Artist Pais on Monday, she verbalized understanding Paging DoctorName DoctorPhone DateTime Result/Outcome Notes Santiago Bumpers [phone] 03/20/2014 1:33:52 PM Called On Call Provider - Left Message PLEASE NOTE: All timestamps contained within this report are represented as [place] Standard Time. CONFIDENTIALTY NOTICE: This fax transmission is intended only for the addressee. It contains information that is legally privileged, confidential or otherwise protected from use or  disclosure. If you are not the intended recipient, you are strictly prohibited from reviewing, disclosing, copying using or disseminating any of this information or taking any action in reliance on or regarding this information. If you have received this fax in error, please notify us immediately by telephone so that we can arrange for its return to [place]. Phone: [phone], Toll-Free: [phone], Fax: [phone] Page: 3 of 4 Call Id: [id number] Paging DoctorName DoctorPhone DateTime Result/Outcome Notes Santiago Bumpers 03/20/2014 2:02:47 PM Spoke with On Call - General I explained to Dr. Milinda Cave ( on call ) that I did not have Dr. Olegario Messier NPI number and he agreed to have me call in a refill for this patient under his name for this patient PLEASE NOTE: All timestamps contained within this report are represented as [place] Standard Time. CONFIDENTIALTY NOTICE: This fax transmission is intended only for the addressee. It contains information that is legally privileged, confidential or otherwise protected from use or disclosure. If you are not the intended recipient, you are strictly prohibited from reviewing, disclosing, copying using or disseminating any of this information or taking any action in reliance on or regarding this information. If you have received this fax in error, please notify us immediately by telephone so that we can arrange for its return to [place]. Phone: [phone], Toll-Free: [phone], Fax: [phone] Page: 4 of 4 Call Id: [phone] [facility] [address], [address] [place], [place] [address] [phone] Fax: [phone] MEDICATION ORDER [facility] Night - Client [facility] - Night Date: 03/20/2014 From: QI Department To: Artist Pais, Rob Please sign the order for the approved drug(s) given by our call  center nurse on your behalf. Fax to [phone] within 5 business days. Thank  you. Date Lamount Cohen Time): 03/20/2014 1:02:57 PM Triage RN: Alphonzo Cruise, RN NAME: Simmie Davies PHONE NUMBER: [phone] (Primary), [phone] (Secondary) BIRTHDATE: 1945-09-13 ADDRESS: CITY/STATE/ZIP: [name] [address] CALLER: Spouse NAME: [name] Rx Given Medication Refill Route Dosage Regime Duration Admin Instructions Bystolic 5 mg Oral once daily # 3 no refills MD Signature Date

## 2014-03-22 NOTE — Telephone Encounter (Signed)
Duplicate

## 2014-03-22 NOTE — Telephone Encounter (Signed)
Pt was seen on 12/9  An was given rx for ringworms. Pt lost rx. Pt also needs refill on [facility]stolic [facility] [place]. Pt is out

## 2014-03-22 NOTE — Telephone Encounter (Signed)
rx sent in electronically for bystolic.  Please advise the rx for ringworms.  I don't see on it med list

## 2014-03-24 ENCOUNTER — Encounter: Payer: Self-pay | Admitting: Podiatrist

## 2014-03-24 ENCOUNTER — Ambulatory Visit (INDEPENDENT_AMBULATORY_CARE_PROVIDER_SITE_OTHER): Payer: Medicare Other | Admitting: Podiatrist

## 2014-03-24 DIAGNOSIS — E1140 Type 2 diabetes mellitus with diabetic neuropathy, unspecified: Secondary | ICD-10-CM | POA: Diagnosis not present

## 2014-03-24 NOTE — Progress Notes (Signed)
   Subjective:    Patient ID: Luis Yu, male    DOB: [birth year], 69 y.o.   MRN: [id number]  HPI Comments: Pt is fitted in his new diabetic shoes [employer] [id number] [employer] Curb[employer] 13 wide and 3 custom molded and given oral and written instructions.  Diabetes      Review of Systems     Objective:   Physical Exam        Assessment & Plan:

## 2014-03-24 NOTE — Patient Instructions (Signed)

## 2014-03-29 NOTE — Progress Notes (Signed)
Patient presented today to pick up his diabetic shoes and inserts. He relates the shoes are comfortable and free of defect. He was given instructions on wear and use of the inserts every 4 months. He will be seen back for his routine care appointment and will call if any concerns arise prior to that visit.

## 2014-04-08 ENCOUNTER — Other Ambulatory Visit: Payer: Self-pay | Admitting: Internal Medicine

## 2014-04-19 ENCOUNTER — Ambulatory Visit (INDEPENDENT_AMBULATORY_CARE_PROVIDER_SITE_OTHER): Payer: Medicare Other | Admitting: Neurology

## 2014-04-19 ENCOUNTER — Ambulatory Visit: Payer: Medicare Other | Admitting: Neurology

## 2014-04-19 ENCOUNTER — Encounter: Payer: Self-pay | Admitting: Neurology

## 2014-04-19 VITALS — BP 140/78 | HR 67 | Ht 74.0 in | Wt 235.6 lb

## 2014-04-19 DIAGNOSIS — M6289 Other specified disorders of muscle: Secondary | ICD-10-CM | POA: Diagnosis not present

## 2014-04-19 DIAGNOSIS — R404 Transient alteration of awareness: Secondary | ICD-10-CM

## 2014-04-19 DIAGNOSIS — R531 Weakness: Secondary | ICD-10-CM

## 2014-04-19 MED ORDER — LEVETIRACETAM 500 MG PO TABS: 500.0000 mg | ORAL_TABLET | Freq: Two times a day (BID) | ORAL | Status: DC

## 2014-04-19 NOTE — Progress Notes (Signed)
NEUROLOGY CONSULTATION NOTE  Luis Yu MRN: [id number] DOB: 1945-12-28  Referring provider: Dr. Artist Pais Primary care provider: Dr. Artist Pais  Reason for consult:  Evaluate for TIA  HISTORY OF PRESENT ILLNESS: Luis Yu is a 69 year old right-handed man with hypertension, hyperlipidemia, coronary artery disease, uncontrolled type II diabetes with peripheral neuropathy, osteoarthritis, GERD, lumbar spinal stenosis and history of bilateral knee replacement and post-herpetic neuralgia who presents for questionable TIAs. Records, labs and MRI of brain were reviewed.  He is accompanied by his wife who provides some history.  For a year and a half, he has had about 10 transient episodes.  He would experience a "weird" feeling and would begin dragging his right arm and leg.  He is conscious during the spell, but he cannot communicate and not completely lucid.  Sometimes, he will have some visual hallucinations and would start grabbing in the air for flowers or try to pick up money that he thinks he sees on the floor.  There is no associated epigastric rising, deja vu, or abnormal movements.  There is no associated headache.  He has never checked his sugars during a spell.  He did have an MRI of the brain performed in December 2014, which showed small vessel ischemic changes, but no mass lesion or large infarct.  He denies history of seizures.  Hgb A1c from 02/08/14 was 9.6. Lipid panel from 10/23/13 showed cholesterol of 134, TG 92, HDL 40.70 and LDL 75. He does take aspirin daily.  PAST MEDICAL HISTORY: Past Medical History  Diagnosis Date  . Depression   . Hypertension   . Hyperlipidemia   . GERD (gastroesophageal reflux disease)   . CAD (coronary artery disease)   . Osteoarthritis   . Postherpetic neuralgia   . Herpes zoster   . Adjustment disorder with mixed emotional features   . Diabetic peripheral neuropathy   . Erectile dysfunction   . Obesity   . Diabetes mellitus type II, uncontrolled      PAST SURGICAL HISTORY: Past Surgical History  Procedure Laterality Date  . Coronary artery bypass graft  1996    eith a LIMA to the LAD, saphenous vein graft to the acute marginal, saphenous vein graft to the PDA and saphenous vein graft to the circumflex.  . Knee arthroscopy  05/2004    right knee  . Replacement total knee bilateral    . Shoulder surgery      MEDICATIONS: Current Outpatient Prescriptions on File Prior to Visit  Medication Sig Dispense Refill  . aspirin 325 MG EC tablet Take 325 mg by mouth daily.      [name] atorvastatin (LIPITOR) 80 MG tablet TAKE 1 TABLET BY MOUTH EVERY DAY 90 tablet 0  . buPROPion (WELLBUTRIN XL) 300 MG 24 hr tablet TAKE 1 TABLET BY MOUTH EVERY DAY 90 tablet 2  . Cyanocobalamin (B-12) 5000 MCG SUBL Place 1 tablet under the tongue once a week.    . fluticasone (FLONASE) 50 MCG/ACT nasal spray Place 2 sprays into both nostrils daily.    [name] gabapentin (NEURONTIN) 300 MG capsule Take 1 capsule (300 mg total) by mouth 3 (three) times daily. For neuropathy. 270 capsule 1  . glucose blood (ACCU-CHEK ACTIVE STRIPS) test strip Use as instructed 100 each 12  . insulin aspart (NOVOLOG) 100 UNIT/ML FlexPen Use 15 to 30 units 15 minutes before meals 3 times daily 3 pen 0  . Insulin Glargine (TOUJEO SOLOSTAR) 300 UNIT/ML SOPN Inject 26 Units into the skin at  bedtime. 5 pen 0  . Insulin Pen Needle (PEN NEEDLES 31GX5/16") 31G X 8 MM MISC Use to inject insulin 5 times a day. 100 each 11  . Lancets (ACCU-CHEK MULTICLIX) lancets Use up to 5 times a day to check blood sugar. 100 each 5  . lisinopril-hydrochlorothiazide (PRINZIDE,ZESTORETIC) 10-12.5 MG per tablet Take 1 tablet by mouth daily. 90 tablet 1  . metFORMIN (GLUCOPHAGE) 1000 MG tablet TAKE ONE TABLET BY MOUTH TWICE A DAY WITH MEALS. 60 tablet 0  . nebivolol (BYSTOLIC) 5 MG tablet Take 1 tablet (5 mg total) by mouth daily. 90 tablet 0  . omeprazole (PRILOSEC) 40 MG capsule TAKE ONE CAPSULE BY MOUTH DAILY 90  capsule 2  . polyethylene glycol (MIRALAX / GLYCOLAX) packet Take 17 g by mouth daily. 17 grams mixed with water two times a day.     . terbinafine (LAMISIL) 250 MG tablet Take 1 tablet (250 mg total) by mouth daily. 7 tablet 0   No current facility-administered medications on file prior to visit.    ALLERGIES: No Known Allergies  FAMILY HISTORY: Family History  Problem Relation Age of Onset  . Coronary artery disease Mother     deceased at 63  . Coronary artery disease Father     alive  . Stroke      half brother  . Stroke Brother     SOCIAL HISTORY: History   Social History  . Marital Status: Married    Spouse Name: [name]    Number of Children: 1  . Years of Education: 13   Occupational History  . Retired Naval architect    Social History Main Topics  . Smoking status: Never Smoker   . Smokeless tobacco: Never Used  . Alcohol Use: 1.2 oz/week    2 Not specified per week     Comment: twice a month.  . Drug Use: No  . Sexual Activity:    Partners: Female   Other Topics Concern  . Not on file   Social History Narrative   Married Counselling psychologist)   Regular exercise: none   Caffeine use: cup of coffee daily    Retired Naval architect   Caffeine- twice daily             REVIEW OF SYSTEMS: Constitutional: No fevers, chills, or sweats, no generalized fatigue, change in appetite Eyes: No visual changes, double vision, eye pain Ear, nose and throat: No hearing loss, ear pain, nasal congestion, sore throat Cardiovascular: No chest pain, palpitations Respiratory:  No shortness of breath at rest or with exertion, wheezes GastrointestinaI: No nausea, vomiting, diarrhea, abdominal pain, fecal incontinence Genitourinary:  No dysuria, urinary retention or frequency Musculoskeletal:  No neck pain, back pain Integumentary: No rash, pruritus, skin lesions Neurological: as above Psychiatric: No depression, insomnia, anxiety Endocrine: No palpitations, fatigue, diaphoresis, mood  swings, change in appetite, change in weight, increased thirst Hematologic/Lymphatic:  No anemia, purpura, petechiae. Allergic/Immunologic: no itchy/runny eyes, nasal congestion, recent allergic reactions, rashes  PHYSICAL EXAM: Filed Vitals:   04/19/14 1510  BP: 140/78  Pulse: 67   General: No acute distress Head:  Normocephalic/atraumatic Eyes:  fundi unremarkable, without vessel changes, exudates, hemorrhages or papilledema. Neck: supple, no paraspinal tenderness, full range of motion Back: No paraspinal tenderness Heart: regular rate and rhythm Lungs: Clear to auscultation bilaterally. Vascular: No carotid bruits. Neurological Exam: Mental status: alert and oriented to person, place, and time, recent and remote memory intact, fund of knowledge intact, attention and concentration intact, speech fluent  and not dysarthric, language intact. Cranial nerves: CN I: not tested CN II: pupils equal, round and reactive to light, visual fields intact, fundi unremarkable, without vessel changes, exudates, hemorrhages or papilledema. CN III, IV, VI:  full range of motion, no nystagmus, no ptosis CN V: facial sensation intact CN VII: upper and lower face symmetric CN VIII: hearing intact CN IX, X: gag intact, uvula midline CN XI: sternocleidomastoid and trapezius muscles intact CN XII: tongue midline Bulk & Tone: normal, no fasciculations. Motor:  5-/5 in both deltoids and right proximal leg. Sensation:  Pinprick sensation intact.  Reduced vibration in feet. Deep Tendon Reflexes:  1+ in upper extremities, absent in lower extremities.  Toes downgoing. Finger to nose testing:  No dysmetria Gait:  Wide-based, right leg limp. Romberg with mild sway.  IMPRESSION: Recurrent episodes of right sided weakness and altered sensorium.  TIA and complex partial seizure are most likely on the differential.  Given that the episodes are clinically habitual and have been occuring for over a year, seizure is  more likely suspected.  Another consideration is episodes of hypoglycemia.  PLAN: 1.  Will start Keppra 500mg  twice daily 2.  Will check EEG 3.  Will get MRI of brain as well as MRA of head and carotid doppler to evaluate for focal intracranial or extracranial stenosis 4.  Check blood sugar next time it occurs. 5.  Follow up in 3 months.  Thank you for allowing me to take part in the care of this patient.  Shon MilletAdam Judithe Keetch, DO  CC:  Ethelene Haloe-Hyun Yoo, MD

## 2014-04-19 NOTE — Patient Instructions (Addendum)
I think the episodes are more likely seizure rather than TIA. 1.  Therefore, we will start a seizure medication called levetiracetam (Keppra) 500mg  twice daily 2.  We will also check MRI of the brain, MRA of the head and carotid doppler 3.  We will check routine EEG 4.  Next time it happens, check blood sugars. 5.  Follow up in 3 months.

## 2014-04-20 ENCOUNTER — Encounter: Payer: Self-pay | Admitting: Internal Medicine

## 2014-04-20 ENCOUNTER — Telehealth: Payer: Self-pay | Admitting: Internal Medicine

## 2014-04-20 ENCOUNTER — Ambulatory Visit (INDEPENDENT_AMBULATORY_CARE_PROVIDER_SITE_OTHER): Payer: Medicare Other | Admitting: Internal Medicine

## 2014-04-20 VITALS — BP 114/68 | HR 58 | Temp 97.5°F | Resp 12 | Wt 238.0 lb

## 2014-04-20 DIAGNOSIS — E1139 Type 2 diabetes mellitus with other diabetic ophthalmic complication: Secondary | ICD-10-CM

## 2014-04-20 DIAGNOSIS — E1165 Type 2 diabetes mellitus with hyperglycemia: Secondary | ICD-10-CM

## 2014-04-20 DIAGNOSIS — IMO0002 Reserved for concepts with insufficient information to code with codable children: Secondary | ICD-10-CM

## 2014-04-20 MED ORDER — INSULIN GLARGINE 300 UNIT/ML ~~LOC~~ SOPN: 30.0000 [IU] | PEN_INJECTOR | Freq: Every day | SUBCUTANEOUS | Status: DC

## 2014-04-20 NOTE — Telephone Encounter (Signed)
Pt needs samples of novolog flex pen or humalog flex pen. Pt also needs toujeo

## 2014-04-20 NOTE — Patient Instructions (Addendum)
Please increase Toujeo to 30 units at bedtime. Continue: - Metformin 1000 mg 2x a day - NovoLog 15 units 3x a day before meals except 30 units when you have a large meal  Please return in 1.5 months with your sugar log.

## 2014-04-20 NOTE — Telephone Encounter (Signed)
Samples are in refrigerator on IM side.  Pt aware

## 2014-04-20 NOTE — Progress Notes (Signed)
Patient ID: Luis Yu, male   DOB: [birth year], 69 y.o.   MRN: [id number]  HPI: Luis Yu is a 69 y.o.-year-old male, returning for DM2, dx 2004, insulin-dependent since 2009, uncontrolled, with  Complications (CAD - s/p [id number], PN, ED). He is here with his wife, who offers most of the history. Last visit 2.5 mo ago.  He has intermittent weakness on R side of body and disorientation >> saw Dr. Everlena Cooper yesterday >> ? Seizures >> started on Keppra.  Last hemoglobin A1c was: Lab Results  Component Value Date   HGBA1C 9.6* 02/08/2014   HGBA1C 9.0* 10/23/2013   HGBA1C 8.5* 06/26/2013  Prev. 8.7%.  Pt is on a regimen of: - Metformin 1000 >> 500 mg po bid - decreased 2/2 Cr increase >> now back on 1000 mg 2x a day - Toujeo 22 >> 26 units qhs  - NovoLog usually 15 units tid ac, 30 units when goes to a buffet. He might miss doses - pens  Pt checks his sugars 1-2x a day and they are: - am: 140-190 >> 130-150 >> 84-170 - 2h after b'fast: 170-180 >> n/c >> n/c - before lunch: 101, 118-186 - 2h after dinner: n/c  - before dinner: 140-150 >> n/c >> 78, 115-160, 175 - bedtime: 180-190 >> n/c  Has occasional lows. Lowest sugar was 78 (before dinner); he has hypoglycemia awareness at 100.  Highest sugar was 300 >> 190 now.  Pt's meals are (per today's check): - Breakfast: egg sandwich + coffee with cream and sugar; sometimes grits, oatmeal, cornflakes + 2% milk - Lunch: may skip or have a sandwich - Dinner: meat + potatoes or rice + vegetables/salads/corn - Snacks: cheese crackers, cake - grazes especially at night Usually has larger 2 meals a day. No sodas. Drinks diet juice/tea.  - + CKD, last BUN/creatinine:  Lab Results  Component Value Date   BUN 20 02/22/2014   CREATININE 1.1 02/22/2014  he is on lisinopril - last set of lipids: Lab Results  Component Value Date   CHOL 134 10/23/2013   HDL 40.70 10/23/2013   LDLCALC 75 10/23/2013   TRIG 92.0 10/23/2013   CHOLHDL 3  10/23/2013  he is on atorvastatin. - last eye exam was in 05/2013 + DR OU. (Dr Dione Booze) - no numbness and tingling in his feet, but has burning pain at night (mostly toes)  Healso has a history of hypertension, hyperlipidemia, osteoarthritis (had bilateral knee replacement), GERD, depression, obesity, OSA - does not wear CPAP, history of herpes zoster with postherpetic neuralgia.  I reviewed pt's medications, allergies, PMH, social hx, family hx, and changes were documented in the history of present illness. Otherwise, unchanged from my initial visit note.  ROS: Constitutional: no weight gain/loss, no fatigue, no subjective hyperthermia/hypothermia Eyes: no blurry vision, no xerophthalmia ENT: no sore throat, no nodules palpated in throat, no dysphagia/odynophagia, no hoarseness Cardiovascular: no CP/SOB/palpitations/leg swelling Respiratory: no cough/SOB Gastrointestinal: no N/V/D/C Musculoskeletal: no muscle/joint aches Skin: no rashes Neurological: no tremors/numbness/tingling/dizziness  PE: BP 114/68 mmHg  Pulse 58  Temp(Src) 97.5 F (36.4 C) (Oral)  Resp 12  Wt 238 lb (107.956 kg)  SpO2 98% Wt Readings from Last 3 Encounters:  04/20/14 238 lb (107.956 kg)  04/19/14 235 lb 9 oz (106.85 kg)  02/24/14 244 lb (110.678 kg)   Constitutional: overweight, in NAD Eyes: PERRLA, EOMI, no exophthalmos ENT: moist mucous membranes, no thyromegaly, no cervical lymphadenopathy Cardiovascular: RRR, No MRG Respiratory: CTA B Gastrointestinal: abdomen soft, NT,  ND, BS+ Musculoskeletal: no deformities, strength intact in all 4 Skin: moist, warm, no rashes Neurological: + tremor with outstretched hand  ASSESSMENT: 1. DM2, insulin-dependent, uncontrolled, with complications - CAD, status post CABG in 1996, status post PCI, with restenosis - Dr. Jens Somrenshaw - Peripheral neuropathy - ED  PLAN:  1. Patient with long-standing, uncontrolled diabetes, on basal-bolus insulin and Metformin.  Sugars fluctuating, not very far from goal. Since all higher >> will increase Toujeo to 30 units.  - We discussed about options for treatment, and I suggested to:  Patient Instructions  Please increase Toujeo to 30 units at bedtime. Continue: - Metformin 1000 mg 2x a day - NovoLog 15 units 3x a day before meals except 30 units when you have a large meal  Please return in 1.5 months with your sugar log.   - given new sugar logs and advised how to fill it and to bring it at next appt  - no labs today - will have HbA1c at next visit with PCP  - advised for yearly eye exams (he is up-to-date on this)

## 2014-04-21 ENCOUNTER — Ambulatory Visit: Payer: Medicare Other | Admitting: Internal Medicine

## 2014-04-26 DIAGNOSIS — M4726 Other spondylosis with radiculopathy, lumbar region: Secondary | ICD-10-CM | POA: Diagnosis not present

## 2014-04-26 DIAGNOSIS — M47812 Spondylosis without myelopathy or radiculopathy, cervical region: Secondary | ICD-10-CM | POA: Diagnosis not present

## 2014-04-26 DIAGNOSIS — G894 Chronic pain syndrome: Secondary | ICD-10-CM | POA: Diagnosis not present

## 2014-04-26 DIAGNOSIS — E1142 Type 2 diabetes mellitus with diabetic polyneuropathy: Secondary | ICD-10-CM | POA: Diagnosis not present

## 2014-04-29 ENCOUNTER — Telehealth: Payer: Self-pay | Admitting: Pulmonary Disease

## 2014-04-29 ENCOUNTER — Encounter: Payer: Self-pay | Admitting: Pulmonary Disease

## 2014-04-29 ENCOUNTER — Ambulatory Visit (INDEPENDENT_AMBULATORY_CARE_PROVIDER_SITE_OTHER): Payer: Medicare Other | Admitting: Pulmonary Disease

## 2014-04-29 VITALS — BP 142/76 | HR 67 | Ht 74.0 in | Wt 233.0 lb

## 2014-04-29 DIAGNOSIS — G4733 Obstructive sleep apnea (adult) (pediatric): Secondary | ICD-10-CM

## 2014-04-29 NOTE — Progress Notes (Signed)
Subjective:    Patient ID: Luis Yu, male    DOB: 10/14/1945, 69 y.o.   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 Sleep Consult    Pt not wearing cpap anymore, wants to restart cpap.  Epworth score 20.,   [name] reports loud snoring especially on his back & witnessed apneas & non refreshing sleep. Epworth Sleepiness Score is 20  He use EPAP for a few months and then abandoned it. He could not use a full face mask at all-he struggled with nasal mask, Finally went back to nasal pillows with chin strap  Bedtime is around midnight, sleep latency about an hour, reports 5-6 nocturnal awakenings, out of bed by 10 AM feeling tired, with dryness of mouth. He reports nocturia-which he attributes to the uncontrolled diabetes, seeing endocrine. He is also being evaluated by neurology for seizures.  Weight loss encouraged, compliance with goal of at least 4-6 hrs every night is the expectation. Advised against medications with sedative side effects Cautioned against driving when sleepy - understanding that sleepiness will vary on a day to day basis   Past Medical History  Diagnosis Date  . Depression   . Hypertension   . Hyperlipidemia   . GERD (gastroesophageal reflux disease)   . CAD (coronary artery disease)   . Osteoarthritis   .  Postherpetic neuralgia   . Herpes zoster   . Adjustment disorder with mixed emotional features   . Diabetic peripheral neuropathy   . Erectile dysfunction   . Obesity   . Diabetes mellitus type II, uncontrolled     Past Surgical History  Procedure Laterality Date  . Coronary artery bypass graft  1996    eith a LIMA to the LAD, saphenous vein graft to the acute marginal, saphenous vein graft to the PDA and saphenous vein graft to the circumflex.  . Knee arthroscopy  05/2004    right knee  . Replacement total knee bilateral    . Shoulder surgery      No Known Allergies  History   Social History  . Marital Status: Married    Spouse Name: [name]  . Number of Children: 1  . Years of Education: 13   Occupational History  . Retired Naval architecttruck driver    Social History Main Topics  . Smoking status: Never Smoker   . Smokeless tobacco: Never Used  . Alcohol Use: 1.2 oz/week    2 Standard drinks or equivalent per week     Comment: twice a month.  . Drug Use: No  . Sexual Activity:    Partners: Female   Other Topics Concern  . Not on file   Social History Narrative   Married [name]([name])   Regular exercise: none   Caffeine use: cup of coffee daily  Retired Naval architect   Caffeine- twice daily             Family History  Problem Relation Age of Onset  . Coronary artery disease Mother     deceased at 38  . Coronary artery disease Father     alive  . Stroke      half brother  . Stroke Brother       Review of Systems  Constitutional: Negative for fever and unexpected weight change.  HENT: Negative for congestion, dental problem, ear pain, nosebleeds, postnasal drip, rhinorrhea, sinus pressure, sneezing, sore throat and trouble swallowing.   Eyes: Negative for redness and itching.  Respiratory: Negative for cough, chest tightness, shortness of breath and wheezing.   Cardiovascular: Negative for palpitations and leg swelling.  Gastrointestinal: Negative for nausea and  vomiting.  Genitourinary: Negative for dysuria.  Musculoskeletal: Negative for joint swelling.  Skin: Negative for rash.  Neurological: Negative for headaches.  Hematological: Does not bruise/bleed easily.  Psychiatric/Behavioral: Negative for dysphoric mood. The patient is not nervous/anxious.        Objective:   Physical Exam  Gen. Pleasant, obese, in no distress, normal affect ENT - no lesions, no post nasal drip, class 2-3 airway Neck: No JVD, no thyromegaly, no carotid bruits Lungs: no use of accessory muscles, no dullness to percussion, decreased without rales or rhonchi  Cardiovascular: Rhythm regular, heart sounds  normal, no murmurs or gallops, no peripheral edema Abdomen: soft and non-tender, no hepatosplenomegaly, BS normal. Musculoskeletal: No deformities, no cyanosis or clubbing Neuro:  alert, non focal, no tremors        Assessment & Plan:

## 2014-04-29 NOTE — Patient Instructions (Signed)
We will send Rx for a new CPAP Call sleep lab [phone] for mask fitting session We will try to get you a DME closer to home

## 2014-04-29 NOTE — Telephone Encounter (Signed)
[facility] reports Luis Yu is not able to get a new CPAP until 11/18/2014. Also it is documented in his note he is non compliant. Per Efraim KaufmannMelissa it would be best if we place Luis Yu back on his current unit and bring him back in a few months and document compliance so no problems will be made when he tries to get a new machine in September. Please advise RA thanks

## 2014-04-29 NOTE — Assessment & Plan Note (Addendum)
We will send Rx for a new CPAP & rechk download 1 mnth after using Call sleep lab [phone] for mask fitting session We will try to get you a DME closer to home The persistent centrals that he had on C Pap 10-12 cm, makes me wonder about complex sleep apnea. We will see how he does this time -he seems motivated and hopefully we can get better compliance  Weight loss encouraged, compliance with goal of at least 4-6 hrs every night is the expectation. Advised against medications with sedative side effects Cautioned against driving when sleepy - understanding that sleepiness will vary on a day to day basis

## 2014-04-30 NOTE — Telephone Encounter (Signed)
He does not have a unit now. He was non compliant before but is willing to retry cpap now Pl let pt know  - if he wants sooner he may have to pay out of pocket

## 2014-05-03 ENCOUNTER — Other Ambulatory Visit (HOSPITAL_BASED_OUTPATIENT_CLINIC_OR_DEPARTMENT_OTHER): Payer: Medicare Other

## 2014-05-03 ENCOUNTER — Other Ambulatory Visit: Payer: Medicare Other

## 2014-05-03 ENCOUNTER — Inpatient Hospital Stay: Admission: RE | Admit: 2014-05-03 | Payer: Medicare Other | Source: Ambulatory Visit

## 2014-05-03 NOTE — Telephone Encounter (Signed)
Patient notified.  No questions or concerns at this time. Nothing further needed.   

## 2014-05-04 ENCOUNTER — Telehealth: Payer: Self-pay | Admitting: Internal Medicine

## 2014-05-04 NOTE — Telephone Encounter (Signed)
Pt saw dr Everlena Cooperjaffe and was put on keppa.  Dr mark phillip  Per pt wife said pt can not take keppa and gabapentin together and that pt needs to be on vit ?b . Please advise

## 2014-05-05 ENCOUNTER — Encounter: Payer: Self-pay | Admitting: Podiatrist

## 2014-05-05 ENCOUNTER — Ambulatory Visit (HOSPITAL_BASED_OUTPATIENT_CLINIC_OR_DEPARTMENT_OTHER): Payer: Medicare Other | Attending: Pulmonary Disease | Admitting: Radiology

## 2014-05-05 ENCOUNTER — Ambulatory Visit (INDEPENDENT_AMBULATORY_CARE_PROVIDER_SITE_OTHER): Payer: Medicare Other | Admitting: Podiatrist

## 2014-05-05 DIAGNOSIS — B351 Tinea unguium: Secondary | ICD-10-CM | POA: Diagnosis not present

## 2014-05-05 DIAGNOSIS — G4733 Obstructive sleep apnea (adult) (pediatric): Secondary | ICD-10-CM

## 2014-05-05 DIAGNOSIS — M79609 Pain in unspecified limb: Principal | ICD-10-CM

## 2014-05-05 NOTE — Patient Instructions (Signed)
Diabetes and Foot Care Diabetes may cause you to have problems because of poor blood supply (circulation) to your feet and legs. This may cause the skin on your feet to become thinner, break easier, and heal more slowly. Your skin may become dry, and the skin may peel and crack. You may also have nerve damage in your legs and feet causing decreased feeling in them. You may not notice minor injuries to your feet that could lead to infections or more serious problems. Taking care of your feet is one of the most important things you can do for yourself.  HOME CARE INSTRUCTIONS  Wear shoes at all times, even in the house. Do not go barefoot. Bare feet are easily injured.  Check your feet daily for blisters, cuts, and redness. If you cannot see the bottom of your feet, use a mirror or ask someone for help.  Wash your feet with warm water (do not use hot water) and mild soap. Then pat your feet and the areas between your toes until they are completely dry. Do not soak your feet as this can dry your skin.  Apply a moisturizing lotion or petroleum jelly (that does not contain alcohol and is unscented) to the skin on your feet and to dry, brittle toenails. Do not apply lotion between your toes.  Trim your toenails straight across. Do not dig under them or around the cuticle. File the edges of your nails with an emery board or nail file.  Do not cut corns or calluses or try to remove them with medicine.  Wear clean socks or stockings every day. Make sure they are not too tight. Do not wear knee-high stockings since they may decrease blood flow to your legs.  Wear shoes that fit properly and have enough cushioning. To break in new shoes, wear them for just a few hours a day. This prevents you from injuring your feet. Always look in your shoes before you put them on to be sure there are no objects inside.  Do not cross your legs. This may decrease the blood flow to your feet.  If you find a minor scrape,  cut, or break in the skin on your feet, keep it and the skin around it clean and dry. These areas may be cleansed with mild soap and water. Do not cleanse the area with peroxide, alcohol, or iodine.  When you remove an adhesive bandage, be sure not to damage the skin around it.  If you have a wound, look at it several times a day to make sure it is healing.  Do not use heating pads or hot water bottles. They may burn your skin. If you have lost feeling in your feet or legs, you may not know it is happening until it is too late.  Make sure your health care provider performs a complete foot exam at least annually or more often if you have foot problems. Report any cuts, sores, or bruises to your health care provider immediately. SEEK MEDICAL CARE IF:   You have an injury that is not healing.  You have cuts or breaks in the skin.  You have an ingrown nail.  You notice redness on your legs or feet.  You feel burning or tingling in your legs or feet.  You have pain or cramps in your legs and feet.  Your legs or feet are numb.  Your feet always feel cold. SEEK IMMEDIATE MEDICAL CARE IF:   There is increasing redness,   swelling, or pain in or around a wound.  There is a red line that goes up your leg.  Pus is coming from a wound.  You develop a fever or as directed by your health care provider.  You notice a bad smell coming from an ulcer or wound. Document Released: 03/02/2000 Document Revised: 11/05/2012 Document Reviewed: 08/12/2012 Exit[employer] Patient Information 2015 [employer]. This information is not intended to replace advice given to you by your health care provider. Make sure you discuss any questions you have with your health care provider.  

## 2014-05-07 ENCOUNTER — Other Ambulatory Visit: Payer: Self-pay | Admitting: Internal Medicine

## 2014-05-10 ENCOUNTER — Ambulatory Visit
Admission: RE | Admit: 2014-05-10 | Discharge: 2014-05-10 | Disposition: A | Discharge status: Home / Self Care | Payer: Medicare Other | Source: Ambulatory Visit | Attending: Neurology | Admitting: Neurology

## 2014-05-10 DIAGNOSIS — R404 Transient alteration of awareness: Secondary | ICD-10-CM

## 2014-05-10 DIAGNOSIS — R531 Weakness: Secondary | ICD-10-CM

## 2014-05-10 DIAGNOSIS — I6523 Occlusion and stenosis of bilateral carotid arteries: Secondary | ICD-10-CM | POA: Diagnosis not present

## 2014-05-11 ENCOUNTER — Telehealth: Payer: Self-pay | Admitting: *Deleted

## 2014-05-11 NOTE — Telephone Encounter (Addendum)
Pt lisinopril was decrease to  10-12.5 mg  From 20-12.5 mg. Pt wife would like to know which mg should pt be on.

## 2014-05-11 NOTE — Telephone Encounter (Signed)
Patient is aware that Doppler was normal

## 2014-05-12 ENCOUNTER — Ambulatory Visit: Payer: Medicare Other | Admitting: Internal Medicine

## 2014-05-12 NOTE — Telephone Encounter (Signed)
Patient was recently seen by Dr. Elvera LennoxGherghe.  At office visit his blood pressure low well controlled.  Was this on 1/2 dose or full dose?

## 2014-05-12 NOTE — Progress Notes (Signed)
   Subjective:    Patient ID: Doretha Imusonald W Kuipers, male    DOB: 11-18-1945, 69 y.o.   MRN: [id number]   Chief Complaint  Patient presents with  . Debridement    Trim toenails       Objective:   Physical Exam Patient is awake, alert, and oriented x 3.  In no acute distress.  Vascular status is intact with palpable pedal pulses at 2/4 DP and PT bilateral and capillary refill time within normal limits. Neurological sensation is decreased bilaterally via Semmes Weinstein monofilament at 1/5 sites. Light touch, vibratory sensation decreased.  Dermatological exam reveals skin color, turger and texture as normal. No open lesions present.  Toenails thick, discolored, distrophic, mycotic and painful x 10.  Previous broken toe of the right fifth digit present.  Musculature intact with dorsiflexion, plantarflexion, inversion, eversion.    Assessment & Plan:  Diabetes, neuropathy, symptomatic toenails  Plan:  Debridement of toenails are accomplished without complication.  He will return in 3 months for continued diabetic foot care.

## 2014-05-12 NOTE — Telephone Encounter (Signed)
Left message for pt to call back  °

## 2014-05-13 ENCOUNTER — Other Ambulatory Visit: Payer: Self-pay | Admitting: Internal Medicine

## 2014-05-13 ENCOUNTER — Telehealth: Payer: Self-pay | Admitting: Internal Medicine

## 2014-05-13 MED ORDER — OMEPRAZOLE 20 MG PO CPDR: 20.0000 mg | DELAYED_RELEASE_CAPSULE | Freq: Two times a day (BID) | ORAL | Status: DC

## 2014-05-13 NOTE — Telephone Encounter (Addendum)
Pt has a bottle of both meds.  Wife states that he is taking the 10-12.5 mg but she does not if she believes him.  I told them he probably need to be seen but [place] [facility] [place].  Pt also wants to know if he should be taking Keppra?  They were told that they can not take the gabapentin and Keppra together so he stopped the Keppra.  They want Dr Olegario MessierYoo's advice on that.

## 2014-05-13 NOTE — Telephone Encounter (Signed)
Pt request refill of the following: Insulin Glargine (TOUJEO SOLOSTAR) 300 UNIT/ML SOPN,, HUMALOG,    Pt said he has been getting samples from us and now he need a rx   [facility]

## 2014-05-13 NOTE — Addendum Note (Signed)
Addended by: Alfred LevinsWYRICK, CINDY D on: 05/13/2014 09:40 AM   Modules accepted: Orders, Medications

## 2014-05-13 NOTE — Telephone Encounter (Signed)
Pt was instructed to pick up tomorrow 2/26

## 2014-05-13 NOTE — Telephone Encounter (Signed)
Opened in error

## 2014-05-13 NOTE — Telephone Encounter (Signed)
Keep taking lisinopril hctz 10/12.5 mg and monitor BP at home.  I am not sure whether information about not being able to take Keppra and gabapentin is correct.  They should call Dr. Moises BloodJaffe's office to confirm.

## 2014-05-14 ENCOUNTER — Telehealth: Payer: Self-pay | Admitting: Internal Medicine

## 2014-05-14 MED ORDER — INSULIN GLARGINE 300 UNIT/ML ~~LOC~~ SOPN: 30.0000 [IU] | PEN_INJECTOR | Freq: Every day | SUBCUTANEOUS | Status: DC

## 2014-05-14 NOTE — Addendum Note (Signed)
Addended by: Alfred LevinsWYRICK, CINDY D on: 05/14/2014 10:18 AM   Modules accepted: Orders

## 2014-05-14 NOTE — Telephone Encounter (Signed)
Pt just pick up insulin and wife can not understand the note left on insulin

## 2014-05-14 NOTE — Telephone Encounter (Signed)
See previous note

## 2014-05-14 NOTE — Telephone Encounter (Signed)
Pt aware.

## 2014-05-24 ENCOUNTER — Ambulatory Visit: Payer: Medicare Other | Admitting: Internal Medicine

## 2014-05-26 ENCOUNTER — Ambulatory Visit: Payer: Medicare Other | Admitting: Podiatrist

## 2014-05-28 ENCOUNTER — Other Ambulatory Visit: Payer: Medicare Other

## 2014-05-28 ENCOUNTER — Inpatient Hospital Stay: Admission: RE | Admit: 2014-05-28 | Payer: Medicare Other | Source: Ambulatory Visit

## 2014-06-01 ENCOUNTER — Telehealth: Payer: Self-pay | Admitting: Internal Medicine

## 2014-06-01 ENCOUNTER — Ambulatory Visit: Payer: Medicare Other | Admitting: Internal Medicine

## 2014-06-01 MED ORDER — INSULIN ASPART 100 UNIT/ML FLEXPEN: PEN_INJECTOR | SUBCUTANEOUS | Status: DC

## 2014-06-01 MED ORDER — INSULIN GLARGINE 300 UNIT/ML ~~LOC~~ SOPN: 30.0000 [IU] | PEN_INJECTOR | Freq: Every day | SUBCUTANEOUS | Status: DC

## 2014-06-01 NOTE — Telephone Encounter (Signed)
rx sent electronically. 

## 2014-06-01 NOTE — Telephone Encounter (Signed)
Pt request refill of the following: insulin aspart (NOVOLOG) 100 UNIT/ML FlexPen , Insulin Glargine (TOUJEO SOLOSTAR) 300 UNIT/ML SOPN  Pt said need short term and long term   [facility] [address] [place]

## 2014-06-01 NOTE — Addendum Note (Signed)
Addended by: Alfred LevinsWYRICK, CINDY D on: 06/01/2014 04:18 PM   Modules accepted: Orders

## 2014-06-04 ENCOUNTER — Ambulatory Visit: Payer: Medicare Other | Admitting: Internal Medicine

## 2014-06-12 ENCOUNTER — Other Ambulatory Visit: Payer: Self-pay | Admitting: Internal Medicine

## 2014-06-16 ENCOUNTER — Ambulatory Visit: Payer: Medicare Other | Admitting: Internal Medicine

## 2014-06-18 ENCOUNTER — Other Ambulatory Visit: Payer: Self-pay | Admitting: Internal Medicine

## 2014-06-23 ENCOUNTER — Encounter: Payer: Self-pay | Admitting: Internal Medicine

## 2014-06-23 ENCOUNTER — Ambulatory Visit (INDEPENDENT_AMBULATORY_CARE_PROVIDER_SITE_OTHER): Payer: Medicare Other | Admitting: Internal Medicine

## 2014-06-23 VITALS — BP 144/80 | HR 73 | Temp 98.0°F | Ht 74.0 in | Wt 236.0 lb

## 2014-06-23 DIAGNOSIS — I10 Essential (primary) hypertension: Secondary | ICD-10-CM

## 2014-06-23 DIAGNOSIS — M5441 Lumbago with sciatica, right side: Secondary | ICD-10-CM | POA: Diagnosis not present

## 2014-06-23 DIAGNOSIS — E1139 Type 2 diabetes mellitus with other diabetic ophthalmic complication: Secondary | ICD-10-CM | POA: Diagnosis not present

## 2014-06-23 DIAGNOSIS — R0600 Dyspnea, unspecified: Secondary | ICD-10-CM | POA: Diagnosis not present

## 2014-06-23 DIAGNOSIS — E1165 Type 2 diabetes mellitus with hyperglycemia: Secondary | ICD-10-CM | POA: Diagnosis not present

## 2014-06-23 DIAGNOSIS — G4733 Obstructive sleep apnea (adult) (pediatric): Secondary | ICD-10-CM

## 2014-06-23 DIAGNOSIS — IMO0002 Reserved for concepts with insufficient information to code with codable children: Secondary | ICD-10-CM

## 2014-06-23 MED ORDER — INSULIN GLARGINE 300 UNIT/ML ~~LOC~~ SOPN: 30.0000 [IU] | PEN_INJECTOR | Freq: Every day | SUBCUTANEOUS | Status: DC

## 2014-06-23 MED ORDER — ATORVASTATIN CALCIUM 80 MG PO TABS
80.0000 mg | ORAL_TABLET | Freq: Every day | ORAL | Status: DC
Start: 2014-06-23 — End: 2014-09-21

## 2014-06-23 MED ORDER — INSULIN ASPART 100 UNIT/ML FLEXPEN: PEN_INJECTOR | SUBCUTANEOUS | Status: DC

## 2014-06-23 MED ORDER — OMEPRAZOLE 20 MG PO CPDR: 20.0000 mg | DELAYED_RELEASE_CAPSULE | Freq: Two times a day (BID) | ORAL | Status: DC

## 2014-06-23 MED ORDER — BUPROPION HCL ER (XL) 300 MG PO TB24: 300.0000 mg | ORAL_TABLET | Freq: Every day | ORAL | Status: DC

## 2014-06-23 MED ORDER — LISINOPRIL-HYDROCHLOROTHIAZIDE 10-12.5 MG PO TABS: 1.0000 | ORAL_TABLET | Freq: Every day | ORAL | Status: DC

## 2014-06-23 MED ORDER — METFORMIN HCL 1000 MG PO TABS: ORAL_TABLET | ORAL | Status: DC

## 2014-06-23 MED ORDER — NEBIVOLOL HCL 10 MG PO TABS
10.0000 mg | ORAL_TABLET | Freq: Every day | ORAL | Status: DC
Start: 2014-06-23 — End: 2015-07-04

## 2014-06-23 MED ORDER — GABAPENTIN 300 MG PO CAPS: ORAL_CAPSULE | ORAL | Status: DC

## 2014-06-23 NOTE — Assessment & Plan Note (Signed)
Patient continues to have ongoing low back pain that radiates to right leg. I reviewed EMG from September 2015 ordered by orthopedic specialist. It showed significant sensorimotor peripheral neuropathy which is demyelinating as well as axonal. No evidence of right lower limb lumbosacral radiculopathy or plexopathy based on needle EMG.  Patient also had MRI of lumbar spine. It is not available for review.  Patient currently working with pain management specialist. He is not a candidate for chronic narcotic medication. His gabapentin dosing regimen was changed to 600 mg at bedtime and 300 mg in the morning. Change to 600 mg twice daily.

## 2014-06-23 NOTE — Progress Notes (Signed)
Pre visit review using our clinic review tool, if applicable. No additional management support is needed unless otherwise documented below in the visit note. 

## 2014-06-23 NOTE — Assessment & Plan Note (Signed)
Patient unable to obtain new machine until Sept 2016.  Patient will try to locate his old machine.  I stressed importance of using CPAP considering multiple comorbidities.  It may be also contributing to his memory issues.

## 2014-06-23 NOTE — Progress Notes (Signed)
Subjective:    Patient ID: Luis Yu, male    DOB: 21-Jun-1945, 69 y.o.   MRN: [id number]  HPI  69 year old white male with history of uncontrolled type 2 diabetes, coronary artery disease and hypertension for follow-up. Interval medical history-patient continues to have ongoing intermittent low back pain that radiates to his right leg. Nerve conduction study completed in September 2015 reviewed. It showed significant sensory motor peripheral neuropathy which is demyelinating as well as exertional. No evidence of right lower limb lumbosacral radiculopathy or plexopathy based on needle EMG.  He is symptomatic 3-4 times per week.  It limits his ability to walk.  Patient also seen by pain management specialist. Dr. Vear ClockPhillips recommended against using both gabapentin and Keppra.  Patient did try taking both medications.  It caused excessive somnolence and patient discontinued Keppra.  Patient also seen by sleep specialist. Patient advised to use CPAP however, he loaned his CPAP machine to a friend 2 years ago. His wife reports he has to wait till September to obtain new machine.  DM II Uncontrolled-no significant change. Patient denies any significant hypoglycemia.  Review of Systems Negative for chest pain, positive for shortness of breath    Past Medical History  Diagnosis Date  . Depression   . Hypertension   . Hyperlipidemia   . GERD (gastroesophageal reflux disease)   . CAD (coronary artery disease)   . Osteoarthritis   . Postherpetic neuralgia   . Herpes zoster   . Adjustment disorder with mixed emotional features   . Diabetic peripheral neuropathy   . Erectile dysfunction   . Obesity   . Diabetes mellitus type II, uncontrolled     History   Social History  . Marital Status: Married    Spouse Name: [name]  . Number of Children: 1  . Years of Education: 13   Occupational History  . Retired Naval architecttruck driver    Social History Main Topics  . Smoking status: Never Smoker     . Smokeless tobacco: Never Used  . Alcohol Use: 1.2 oz/week    2 Standard drinks or equivalent per week     Comment: twice a month.  . Drug Use: No  . Sexual Activity:    Partners: Female   Other Topics Concern  . Not on file   Social History Narrative   Married [name]([name])   Regular exercise: none   Caffeine use: cup of coffee daily    Retired Naval architecttruck driver   Caffeine- twice daily             Past Surgical History  Procedure Laterality Date  . Coronary artery bypass graft  1996    eith a LIMA to the LAD, saphenous vein graft to the acute marginal, saphenous vein graft to the PDA and saphenous vein graft to the circumflex.  . Knee arthroscopy  05/2004    right knee  . Replacement total knee bilateral    . Shoulder surgery      Family History  Problem Relation Age of Onset  . Coronary artery disease Mother     deceased at 7075  . Coronary artery disease Father     alive  . Stroke      half brother  . Stroke Brother     No Known Allergies  Current Outpatient Prescriptions on File Prior to Visit  Medication Sig Dispense Refill  . aspirin 325 MG EC tablet Take 325 mg by mouth daily.      [name]. atorvastatin (LIPITOR)  80 MG tablet TAKE 1 TABLET BY MOUTH EVERY DAY 90 tablet 0  . buPROPion (WELLBUTRIN XL) 300 MG 24 hr tablet TAKE 1 TABLET BY MOUTH EVERY DAY 90 tablet 2  . BYSTOLIC 5 MG tablet TAKE 1 TABLET (5 MG TOTAL) BY MOUTH DAILY. 90 tablet 0  . Cyanocobalamin (B-12) 5000 MCG SUBL Place 1 tablet under the tongue once a week.    . fluticasone (FLONASE) 50 MCG/ACT nasal spray Place 2 sprays into both nostrils daily.    [name] gabapentin (NEURONTIN) 300 MG capsule Take 1 capsule (300 mg total) by mouth 3 (three) times daily. For neuropathy. 270 capsule 1  . glucose blood (ACCU-CHEK ACTIVE STRIPS) test strip Use as instructed 100 each 12  . insulin aspart (NOVOLOG) 100 UNIT/ML FlexPen Use 15 to 30 units 15 minutes before meals 3 times daily 9 pen 0  . Insulin Glargine (TOUJEO  SOLOSTAR) 300 UNIT/ML SOPN Inject 30 Units into the skin at bedtime. 2 pen 0  . Insulin Pen Needle (PEN NEEDLES 31GX5/16") 31G X 8 MM MISC Use to inject insulin 5 times a day. 100 each 11  . L-Methylfolate-B6-B12 (METANX PO) Take 1 capsule by mouth daily.    . Lancets (ACCU-CHEK MULTICLIX) lancets Use up to 5 times a day to check blood sugar. 100 each 5  . levETIRAcetam (KEPPRA) 500 MG tablet Take 1 tablet (500 mg total) by mouth 2 (two) times daily. 60 tablet 5  . lisinopril-hydrochlorothiazide (PRINZIDE,ZESTORETIC) 10-12.5 MG per tablet Take 1 tablet by mouth daily. 90 tablet 1  . metFORMIN (GLUCOPHAGE) 1000 MG tablet TAKE ONE TABLET BY MOUTH TWICE A DAY WITH MEALS. 60 tablet 5  . omeprazole (PRILOSEC) 20 MG capsule Take 1 capsule (20 mg total) by mouth 2 (two) times daily before a meal. 60 capsule 3  . polyethylene glycol (MIRALAX / GLYCOLAX) packet Take 17 g by mouth daily. 17 grams mixed with water two times a day.     . terbinafine (LAMISIL) 250 MG tablet Take 1 tablet (250 mg total) by mouth daily. 7 tablet 0   No current facility-administered medications on file prior to visit.    BP 144/80 mmHg  Pulse 73  Temp(Src) 98 F (36.7 C) (Oral)  Ht  (1.88 m)  Wt 236 lb (107.049 kg)  BMI 30.29 kg/m2    Objective:   Physical Exam  Constitutional: He is oriented to person, place, and time. He appears well-developed and well-nourished. No distress.  HENT:  Head: Normocephalic and atraumatic.  Right Ear: External ear normal.  Left Ear: External ear normal.  Cardiovascular: Normal rate, regular rhythm and normal heart sounds.   No murmur heard. Pulmonary/Chest: Effort normal and breath sounds normal. He has no wheezes.  Musculoskeletal: He exhibits no edema.  Neurological: He is alert and oriented to person, place, and time. No cranial nerve deficit.  Skin: Skin is warm and dry.  Psychiatric: He has a normal mood and affect. His behavior is normal.          Assessment &  Plan:

## 2014-06-23 NOTE — Patient Instructions (Signed)
Resume CPAP if possible Take higher dose of gabapentin as directed

## 2014-06-23 NOTE — Assessment & Plan Note (Signed)
Patient advised to follow-up with cardiology regarding dyspnea.

## 2014-06-23 NOTE — Assessment & Plan Note (Signed)
BP not at goal.  Increase Bystolic to 10 mg.  Continue other medications unchanged.  BP: (!) 144/80 mmHg

## 2014-06-23 NOTE — Assessment & Plan Note (Signed)
Monitor A1c.  Followed and managed by Dr. Elvera LennoxGherghe

## 2014-06-24 LAB — BASIC METABOLIC PANEL
BUN: 24 mg/dL — ABNORMAL HIGH (ref 6–23)
CO2: 30 mEq/L (ref 19–32)
Calcium: 9.4 mg/dL (ref 8.4–10.5)
Chloride: 101 mEq/L (ref 96–112)
Creatinine, Ser: 1.22 mg/dL (ref 0.40–1.50)
GFR: 62.41 mL/min (ref >60.00)
Glucose, Bld: 261 mg/dL — ABNORMAL HIGH (ref 70–99)
Potassium: 4.6 mEq/L (ref 3.5–5.1)
Sodium: 136 mEq/L (ref 135–145)

## 2014-06-24 LAB — MICROALBUMIN / CREATININE URINE RATIO
Creatinine,U: 166.3 mg/dL
Microalb Creat Ratio: 1.1 mg/g (ref 0.0–30.0)
Microalb, Ur: 1.8 mg/dL (ref 0.0–1.9)

## 2014-06-24 LAB — HEMOGLOBIN A1C: Hgb A1c MFr Bld: 10.1 % — ABNORMAL HIGH (ref 4.6–6.5)

## 2014-06-28 ENCOUNTER — Telehealth: Payer: Self-pay | Admitting: *Deleted

## 2014-06-28 ENCOUNTER — Ambulatory Visit: Payer: Medicare Other | Admitting: Adult Health

## 2014-06-28 NOTE — Telephone Encounter (Signed)
-----   Message from Doe-Hyun Sherran Needs Yoo, DO sent at 06/23/2014  8:43 PM EDT ----- Call [facility] and obtain copy of his last MRI of lumbar spine.  thx  RY

## 2014-06-28 NOTE — Telephone Encounter (Signed)
Called and left message on Medical records voicemail with patient's name and dob to fax over the MRI.

## 2014-07-09 DIAGNOSIS — Z961 Presence of intraocular lens: Secondary | ICD-10-CM | POA: Diagnosis not present

## 2014-07-09 DIAGNOSIS — H02403 Unspecified ptosis of bilateral eyelids: Secondary | ICD-10-CM | POA: Diagnosis not present

## 2014-07-09 DIAGNOSIS — E11321 Type 2 diabetes mellitus with mild nonproliferative diabetic retinopathy with macular edema: Secondary | ICD-10-CM | POA: Diagnosis not present

## 2014-07-13 ENCOUNTER — Other Ambulatory Visit: Payer: Self-pay | Admitting: Internal Medicine

## 2014-07-13 DIAGNOSIS — M4310 Spondylolisthesis, site unspecified: Secondary | ICD-10-CM

## 2014-07-14 ENCOUNTER — Telehealth: Payer: Self-pay | Admitting: Internal Medicine

## 2014-07-14 NOTE — Telephone Encounter (Signed)
Message received in voicemail from patient's wife requesting tier exceptions for Novolog, Toujeo and Bystolic.  All 3 exceptions have been faxed to [employer].

## 2014-07-15 NOTE — Telephone Encounter (Signed)
Tier request for Bystolic was approved.

## 2014-07-15 NOTE — Telephone Encounter (Signed)
Toujeo was approved and Novolog was denied.  Patient's plan has a list of preferred medication on a lower tier that patient can use. I am still waiting on a reply for Bystolic.  I LMOM for patient with this information.

## 2014-07-15 NOTE — Telephone Encounter (Signed)
I called and spoke with patient about the requests.

## 2014-07-21 ENCOUNTER — Ambulatory Visit: Payer: Medicare Other | Admitting: Neurology

## 2014-07-28 ENCOUNTER — Encounter: Payer: Self-pay | Admitting: Internal Medicine

## 2014-08-05 ENCOUNTER — Ambulatory Visit: Payer: Medicare Other | Admitting: Podiatrist

## 2014-08-06 ENCOUNTER — Ambulatory Visit: Payer: Medicare Other | Admitting: Neurology

## 2014-08-07 ENCOUNTER — Other Ambulatory Visit: Payer: Self-pay | Admitting: Internal Medicine

## 2014-08-16 ENCOUNTER — Other Ambulatory Visit: Payer: Self-pay | Admitting: Internal Medicine

## 2014-08-20 ENCOUNTER — Telehealth: Payer: Self-pay | Admitting: Internal Medicine

## 2014-08-20 NOTE — Telephone Encounter (Signed)
Please advise. Will be happy to contact patient.

## 2014-08-20 NOTE — Telephone Encounter (Signed)
Patient states he was advised fully of what was seen on the MRI and why he was referred to Neurosurgery.  Patient would like a call back to advise.  Patient was scheduled to see Dr. Artist PaisYoo on 08/27/14 but has had to r/s due to his absence.

## 2014-08-23 NOTE — Telephone Encounter (Signed)
Unable to see MRI results. Please disclose results so can communicate them to patient.

## 2014-08-23 NOTE — Telephone Encounter (Signed)
MRI results are in medical records under images (scanned) they were ordered by orthopedic specialist.

## 2014-08-23 NOTE — Telephone Encounter (Signed)
I suggest he proceed with neurosurgical consultation based upon MRI findings.  They can further determine whether he is a candidate for surgery keeping in mind that patient is higher surgical risk due to hx of CAD and poorly controlled diabetes.

## 2014-08-24 NOTE — Telephone Encounter (Signed)
Luis AspCindy can you please update patient.

## 2014-08-25 NOTE — Telephone Encounter (Signed)
Pt aware, he has an appt next week.

## 2014-08-27 ENCOUNTER — Ambulatory Visit: Payer: Medicare Other | Admitting: Internal Medicine

## 2014-08-30 ENCOUNTER — Ambulatory Visit (INDEPENDENT_AMBULATORY_CARE_PROVIDER_SITE_OTHER): Payer: Medicare Other | Admitting: Podiatry

## 2014-08-30 DIAGNOSIS — E1140 Type 2 diabetes mellitus with diabetic neuropathy, unspecified: Secondary | ICD-10-CM

## 2014-08-30 DIAGNOSIS — M79609 Pain in unspecified limb: Principal | ICD-10-CM

## 2014-08-30 DIAGNOSIS — M79606 Pain in leg, unspecified: Secondary | ICD-10-CM | POA: Diagnosis not present

## 2014-08-30 DIAGNOSIS — B351 Tinea unguium: Secondary | ICD-10-CM

## 2014-08-30 NOTE — Progress Notes (Signed)
Patient ID: Luis Yu, male   DOB: 21-Feb-1946, 69 y.o.   MRN: [id number] [facility]

## 2014-08-31 NOTE — Progress Notes (Signed)
Subjective:     Patient ID: Luis Yu, male   DOB: 1945-10-31, 69 y.o.   MRN: [id number]  HPI Asian presents with painful E long gaited nailbeds 1-5 both feet and long-term diabetes with inability to take care of the beds   Review of Systems     Objective:   Physical Exam Neurovascular status unchanged with thick yellow brittle nailbeds 1-5 both feet that he states are painful and he cannot take care of    Assessment:     At risk diabetic with mycotic nail infection and pain 1-5 both feet    Plan:     Debris painful nailbeds 1-5 both feet with no iatrogenic bleeding noted

## 2014-09-01 DIAGNOSIS — Z6829 Body mass index (BMI) 29.0-29.9, adult: Secondary | ICD-10-CM | POA: Diagnosis not present

## 2014-09-01 DIAGNOSIS — E1142 Type 2 diabetes mellitus with diabetic polyneuropathy: Secondary | ICD-10-CM | POA: Diagnosis not present

## 2014-09-02 ENCOUNTER — Ambulatory Visit: Payer: Medicare Other

## 2014-09-14 ENCOUNTER — Telehealth: Payer: Self-pay | Admitting: Internal Medicine

## 2014-09-14 MED ORDER — "PEN NEEDLES 5/16"" 31G X 8 MM MISC": Status: DC

## 2014-09-14 MED ORDER — INSULIN GLARGINE 300 UNIT/ML ~~LOC~~ SOPN: 30.0000 [IU] | PEN_INJECTOR | Freq: Every day | SUBCUTANEOUS | Status: DC

## 2014-09-14 MED ORDER — INSULIN ASPART 100 UNIT/ML FLEXPEN: PEN_INJECTOR | SUBCUTANEOUS | Status: DC

## 2014-09-14 NOTE — Telephone Encounter (Signed)
Patient's wife would like to know if patient can have samples of Insulin Glargine (TOUJEO SOLOSTAR) 300 UNIT/ML SOPN and insulin aspart (NOVOLOG) 100 UNIT/ML FlexPen?  Patient is in the donut hole and it is to expensive.    And need Insulin Pen Needle (PEN NEEDLES 31GX5/16") 31G X 8 MM MISC [id number] [name] [place] - [address]

## 2014-09-14 NOTE — Telephone Encounter (Signed)
Samples ready for p/u, rx sent in electronically, pt aware

## 2014-09-18 ENCOUNTER — Other Ambulatory Visit: Payer: Self-pay | Admitting: Internal Medicine

## 2014-09-21 ENCOUNTER — Other Ambulatory Visit: Payer: Medicare Other

## 2014-09-22 ENCOUNTER — Ambulatory Visit: Payer: Medicare Other | Admitting: *Deleted

## 2014-09-22 DIAGNOSIS — E1140 Type 2 diabetes mellitus with diabetic neuropathy, unspecified: Secondary | ICD-10-CM

## 2014-09-22 NOTE — Progress Notes (Signed)
Patient ID: Luis ImusDonald W Yu, male   DOB: 14-Nov-1945, 69 y.o.   MRN: [id number] Patient presents for pick up of reordered diabetic shoes.  Shoes are tried on for good fit and seem to hug the heel more securely.  Patient already has his custom inserts at home and will follow his break in and wear instructions. Patient will follow up for his routine care as scheduled.

## 2014-09-22 NOTE — Patient Instructions (Signed)

## 2014-10-22 ENCOUNTER — Ambulatory Visit (INDEPENDENT_AMBULATORY_CARE_PROVIDER_SITE_OTHER): Payer: Medicare Other | Admitting: Internal Medicine

## 2014-10-22 ENCOUNTER — Encounter: Payer: Self-pay | Admitting: Internal Medicine

## 2014-10-22 VITALS — BP 120/78 | HR 80 | Temp 98.2°F | Ht 74.0 in | Wt 238.4 lb

## 2014-10-22 DIAGNOSIS — I10 Essential (primary) hypertension: Secondary | ICD-10-CM

## 2014-10-22 DIAGNOSIS — E1165 Type 2 diabetes mellitus with hyperglycemia: Secondary | ICD-10-CM

## 2014-10-22 DIAGNOSIS — M5441 Lumbago with sciatica, right side: Secondary | ICD-10-CM | POA: Diagnosis not present

## 2014-10-22 DIAGNOSIS — E1139 Type 2 diabetes mellitus with other diabetic ophthalmic complication: Secondary | ICD-10-CM

## 2014-10-22 DIAGNOSIS — IMO0002 Reserved for concepts with insufficient information to code with codable children: Secondary | ICD-10-CM

## 2014-10-22 MED ORDER — GLUCOSE BLOOD VI STRP: ORAL_STRIP | Status: DC

## 2014-10-22 MED ORDER — METFORMIN HCL 1000 MG PO TABS: ORAL_TABLET | ORAL | Status: DC

## 2014-10-22 MED ORDER — GABAPENTIN 300 MG PO CAPS: ORAL_CAPSULE | ORAL | Status: DC

## 2014-10-22 MED ORDER — LISINOPRIL-HYDROCHLOROTHIAZIDE 20-12.5 MG PO TABS: 1.0000 | ORAL_TABLET | Freq: Every day | ORAL | Status: DC

## 2014-10-22 MED ORDER — ATORVASTATIN CALCIUM 80 MG PO TABS: 80.0000 mg | ORAL_TABLET | Freq: Every day | ORAL | Status: DC

## 2014-10-22 NOTE — Progress Notes (Signed)
Subjective:    Patient ID: Luis Yu, male    DOB: [birth year], 69 y.o.   MRN: [id number]  HPI  69 year old white male with history of coronary artery disease, uncontrolled type 2 diabetes, hypertension and chronic low back pain for follow-up.  Interval medical history-patient seen by neurosurgeon. After review of her conduction study and MRI lumbar spine, it was determined patient is not a surgical candidate. He continues to have chronic low back pain that is worse with ambulation. He had limited improvement with physical therapy.  Diabetes mellitus type 2 uncontrolled-patient not using mealtime insulin on a regular basis. He denies any significant hypoglycemic episodes.  Review of Systems Negative for chest pain, chronic low back pain    Past Medical History  Diagnosis Date  . Depression   . Hypertension   . Hyperlipidemia   . GERD (gastroesophageal reflux disease)   . CAD (coronary artery disease)   . Osteoarthritis   . Postherpetic neuralgia   . Herpes zoster   . Adjustment disorder with mixed emotional features   . Diabetic peripheral neuropathy   . Erectile dysfunction   . Obesity   . Diabetes mellitus type II, uncontrolled     History   Social History  . Marital Status: Married    Spouse Name: [name]  . Number of Children: 1  . Years of Education: 13   Occupational History  . Retired Naval architect    Social History Main Topics  . Smoking status: Never Smoker   . Smokeless tobacco: Never Used  . Alcohol Use: 1.2 oz/week    2 Standard drinks or equivalent per week     Comment: twice a month.  . Drug Use: No  . Sexual Activity:    Partners: Female   Other Topics Concern  . Not on file   Social History Narrative   Married [name])   Regular exercise: none   Caffeine use: cup of coffee daily    Retired Naval architect   Caffeine- twice daily             Past Surgical History  Procedure Laterality Date  . Coronary artery bypass graft  1996   eith a LIMA to the LAD, saphenous vein graft to the acute marginal, saphenous vein graft to the PDA and saphenous vein graft to the circumflex.  . Knee arthroscopy  05/2004    right knee  . Replacement total knee bilateral    . Shoulder surgery      Family History  Problem Relation Age of Onset  . Coronary artery disease Mother     deceased at 24  . Coronary artery disease Father     alive  . Stroke      half brother  . Stroke Brother     No Known Allergies  Current Outpatient Prescriptions on File Prior to Visit  Medication Sig Dispense Refill  . aspirin 325 MG EC tablet Take 325 mg by mouth daily.      [name] atorvastatin (LIPITOR) 80 MG tablet TAKE 1 TABLET BY MOUTH EVERY DAY 90 tablet 0  . buPROPion (WELLBUTRIN XL) 300 MG 24 hr tablet Take 1 tablet (300 mg total) by mouth daily. 90 tablet 3  . Cyanocobalamin (B-12) 5000 MCG SUBL Place 1 tablet under the tongue once a week.    . fluticasone (FLONASE) 50 MCG/ACT nasal spray Place 2 sprays into both nostrils daily.    [name] gabapentin (NEURONTIN) 300 MG capsule Use 2 caps at night and  2 caps in the morning. For neuropathy. 360 capsule 1  . glucose blood (ACCU-CHEK ACTIVE STRIPS) test strip Use as instructed 100 each 12  . insulin aspart (NOVOLOG) 100 UNIT/ML FlexPen Use 15 to 30 units 15 minutes before meals 3 times daily 2 pen 0  . Insulin Glargine (TOUJEO SOLOSTAR) 300 UNIT/ML SOPN Inject 30 Units into the skin at bedtime. 2 pen 0  . Insulin Pen Needle (PEN NEEDLES 31GX5/16") 31G X 8 MM MISC Use to inject insulin 5 times a day. 100 each 11  . Lancets (ACCU-CHEK MULTICLIX) lancets Use up to 5 times a day to check blood sugar. 100 each 5  . lisinopril-hydrochlorothiazide (PRINZIDE,ZESTORETIC) 20-12.5 MG per tablet TAKE 1 TABLET BY MOUTH EVERY DAY 90 tablet 0  . metFORMIN (GLUCOPHAGE) 1000 MG tablet TAKE ONE TABLET BY MOUTH TWICE A DAY WITH MEALS. 180 tablet 3  . nebivolol (BYSTOLIC) 10 MG tablet Take 1 tablet (10 mg total) by mouth daily.  90 tablet 3  . omeprazole (PRILOSEC) 20 MG capsule Take 1 capsule (20 mg total) by mouth 2 (two) times daily before a meal. 180 capsule 3  . omeprazole (PRILOSEC) 20 MG capsule TAKE 1 CAPSULE (20 MG TOTAL) BY MOUTH 2 (TWO) TIMES DAILY BEFORE A MEAL. 180 capsule 0  . polyethylene glycol (MIRALAX / GLYCOLAX) packet Take 17 g by mouth daily. 17 grams mixed with water two times a day.      No current facility-administered medications on file prior to visit.    BP 120/78 mmHg  Pulse 80  Temp(Src) 98.2 F (36.8 C) (Oral)  Ht 6\' 2"  (1.88 m)  Wt 238 lb 6.4 oz (108.138 kg)  BMI 30.60 kg/m2    Objective:   Physical Exam  Constitutional: He is oriented to person, place, and time. He appears well-developed and well-nourished. No distress.  HENT:  Head: Normocephalic and atraumatic.  Bilateral cerumen  Neck: Neck supple.  Cardiovascular: Normal rate, regular rhythm and normal heart sounds.   No murmur heard. Pulmonary/Chest: Effort normal and breath sounds normal. He has no wheezes.  Musculoskeletal: He exhibits no edema.  Lymphadenopathy:    He has no cervical adenopathy.  Neurological: He is alert and oriented to person, place, and time. No cranial nerve deficit.  Psychiatric: He has a normal mood and affect. His behavior is normal.        Assessment & Plan:   1. Chronic low back pain 2. Diabetes mellitus type 2 uncontrolled 3. Essential hypertension  Patient seen by a neurosurgeon for chronic low back pain. Patient's lumbar MRI and nerve conduction studies reviewed. Nerve conduction studies showed evidence of polyneuropathy. Lumbar MRI shows significant spondylosis. Unfortunately, he has had limited response to physical therapy. Contact information for massage and acupuncture therapy provided.  Check hemoglobin A1c today. Patient encouraged to use his mealtime insulin on a regular basis. Patient also advised to use short acting insulin before eating desserts.  Samples of Novolog  and Toujeo provided.  Blood pressure is at goal.

## 2014-10-22 NOTE — Progress Notes (Signed)
Pre visit review using our clinic review tool, if applicable. No additional management support is needed unless otherwise documented below in the visit note. 

## 2014-10-23 LAB — HEMOGLOBIN A1C
Hgb A1c MFr Bld: 9.3 % — ABNORMAL HIGH (ref <5.7)
Mean Plasma Glucose: 220 mg/dL — ABNORMAL HIGH (ref <117)

## 2014-10-23 LAB — BASIC METABOLIC PANEL
BUN: 23 mg/dL (ref 7–25)
CO2: 26 mmol/L (ref 20–31)
Calcium: 9 mg/dL (ref 8.6–10.3)
Chloride: 104 mmol/L (ref 98–110)
Creat: 1.04 mg/dL (ref 0.70–1.18)
Glucose, Bld: 260 mg/dL — ABNORMAL HIGH (ref 65–99)
Potassium: 4.5 mmol/L (ref 3.5–5.3)
Sodium: 141 mmol/L (ref 135–146)

## 2014-11-25 ENCOUNTER — Other Ambulatory Visit: Payer: Self-pay | Admitting: Internal Medicine

## 2014-12-03 ENCOUNTER — Ambulatory Visit: Payer: Medicare Other | Admitting: Internal Medicine

## 2014-12-07 ENCOUNTER — Ambulatory Visit: Payer: Medicare Other | Admitting: Podiatry

## 2014-12-15 ENCOUNTER — Other Ambulatory Visit: Payer: Self-pay | Admitting: Internal Medicine

## 2014-12-16 ENCOUNTER — Other Ambulatory Visit: Payer: Self-pay | Admitting: Internal Medicine

## 2015-01-06 ENCOUNTER — Other Ambulatory Visit: Payer: Self-pay | Admitting: Internal Medicine

## 2015-01-07 ENCOUNTER — Ambulatory Visit: Payer: Medicare Other | Admitting: Internal Medicine

## 2015-01-11 ENCOUNTER — Other Ambulatory Visit: Payer: Self-pay | Admitting: Internal Medicine

## 2015-01-24 ENCOUNTER — Telehealth: Payer: Self-pay | Admitting: Internal Medicine

## 2015-01-24 NOTE — Telephone Encounter (Signed)
ERROR

## 2015-01-25 ENCOUNTER — Other Ambulatory Visit: Payer: Self-pay | Admitting: *Deleted

## 2015-01-25 MED ORDER — INSULIN GLARGINE 300 UNIT/ML ~~LOC~~ SOPN: 30.0000 [IU] | PEN_INJECTOR | Freq: Every day | SUBCUTANEOUS | Status: DC

## 2015-02-09 ENCOUNTER — Encounter: Payer: Self-pay | Admitting: Podiatry

## 2015-02-09 ENCOUNTER — Ambulatory Visit (INDEPENDENT_AMBULATORY_CARE_PROVIDER_SITE_OTHER): Payer: Medicare Other | Admitting: Podiatry

## 2015-02-09 DIAGNOSIS — Z961 Presence of intraocular lens: Secondary | ICD-10-CM | POA: Diagnosis not present

## 2015-02-09 DIAGNOSIS — Z794 Long term (current) use of insulin: Secondary | ICD-10-CM

## 2015-02-09 DIAGNOSIS — M79609 Pain in unspecified limb: Principal | ICD-10-CM

## 2015-02-09 DIAGNOSIS — E133293 Other specified diabetes mellitus with mild nonproliferative diabetic retinopathy without macular edema, bilateral: Secondary | ICD-10-CM | POA: Diagnosis not present

## 2015-02-09 DIAGNOSIS — H02403 Unspecified ptosis of bilateral eyelids: Secondary | ICD-10-CM | POA: Diagnosis not present

## 2015-02-09 DIAGNOSIS — M79673 Pain in unspecified foot: Secondary | ICD-10-CM

## 2015-02-09 DIAGNOSIS — B351 Tinea unguium: Secondary | ICD-10-CM

## 2015-02-09 DIAGNOSIS — E1140 Type 2 diabetes mellitus with diabetic neuropathy, unspecified: Secondary | ICD-10-CM | POA: Diagnosis not present

## 2015-02-09 LAB — HM DIABETES EYE EXAM

## 2015-02-09 NOTE — Progress Notes (Signed)
Patient ID: Luis ImusDonald W Aina, male   DOB: [birth year], 69 y.o.   MRN: [id number] Complaint:  Visit Type: Patient returns to my office for continued preventative foot care services. Complaint: Patient states" my nails have grown long and thick and become painful to walk and wear shoes" Patient has been diagnosed with DM with neuropathy. The patient presents for preventative foot care services. No changes to ROS  Podiatric Exam: Vascular: dorsalis pedis and posterior tibial pulses are palpable bilateral. Capillary return is immediate. Temperature gradient is WNL. Skin turgor WNL  Sensorium: Normal Semmes Weinstein monofilament test. Normal tactile sensation bilaterally. Nail Exam: Pt has thick disfigured discolored nails with subungual debris noted bilateral entire nail hallux through fifth toenails Ulcer Exam: There is no evidence of ulcer or pre-ulcerative changes or infection. Orthopedic Exam: Muscle tone and strength are WNL. No limitations in general ROM. No crepitus or effusions noted. Foot type and digits show no abnormalities. Bony prominences are unremarkable. Skin: No Porokeratosis. No infection or ulcers  Diagnosis:  Onychomycosis, , Pain in right toe, pain in left toes  Treatment & Plan Procedures and Treatment: Consent by patient was obtained for treatment procedures. The patient understood the discussion of treatment and procedures well. All questions were answered thoroughly reviewed. Debridement of mycotic and hypertrophic toenails, 1 through 5 bilateral and clearing of subungual debris. No ulceration, no infection noted.  Return Visit-Office Procedure: Patient instructed to return to the office for a follow up visit 3 months for continued evaluation and treatment.    Helane GuntherGregory Ismelda Weatherman DPM

## 2015-02-18 ENCOUNTER — Ambulatory Visit (INDEPENDENT_AMBULATORY_CARE_PROVIDER_SITE_OTHER): Payer: Medicare Other

## 2015-02-18 DIAGNOSIS — Z23 Encounter for immunization: Secondary | ICD-10-CM | POA: Diagnosis not present

## 2015-02-23 ENCOUNTER — Ambulatory Visit: Payer: Medicare Other | Admitting: Internal Medicine

## 2015-02-24 ENCOUNTER — Encounter: Payer: Self-pay | Admitting: Internal Medicine

## 2015-02-28 ENCOUNTER — Telehealth: Payer: Self-pay | Admitting: Internal Medicine

## 2015-02-28 DIAGNOSIS — H9190 Unspecified hearing loss, unspecified ear: Secondary | ICD-10-CM

## 2015-02-28 NOTE — Telephone Encounter (Signed)
Mr. Luis Yu called saying [place] [address] [place] and he's wondering if a referral can be sent to an Audiologist for him in that area.  The location he'd like to go to is [facility] ph# [phone] / fax# [phone] If you have any questions or concerns, please contact the pt.  Pt's ph# [phone] Thank you.

## 2015-03-01 ENCOUNTER — Other Ambulatory Visit: Payer: Self-pay | Admitting: Internal Medicine

## 2015-03-01 DIAGNOSIS — H9190 Unspecified hearing loss, unspecified ear: Secondary | ICD-10-CM

## 2015-03-01 NOTE — Telephone Encounter (Signed)
Referral placed.

## 2015-03-01 NOTE — Telephone Encounter (Signed)
Ok [place]

## 2015-03-25 ENCOUNTER — Other Ambulatory Visit: Payer: Self-pay | Admitting: Internal Medicine

## 2015-04-13 ENCOUNTER — Ambulatory Visit: Payer: Medicare Other | Admitting: Internal Medicine

## 2015-04-21 ENCOUNTER — Telehealth: Payer: Self-pay | Admitting: Internal Medicine

## 2015-04-21 NOTE — Telephone Encounter (Signed)
No samples available at this time.  Patient will call back next week.

## 2015-04-21 NOTE — Telephone Encounter (Signed)
Pt had appointment with Dr Artist Pais on 2/8 which had to be canceleld.  Pt was hoping to get samples at that time   as he usually does of the  insulin aspart (NOVOLOG) 100 UNIT/ML FlexPen Insulin Glargine (TOUJEO SOLOSTAR) 300 UNIT/ML SOPN  Pt has rescheduled with Dr Artist Pais , but would like samples today if possible.

## 2015-04-27 ENCOUNTER — Ambulatory Visit: Payer: Medicare Other | Admitting: Internal Medicine

## 2015-05-11 ENCOUNTER — Ambulatory Visit: Payer: Medicare Other | Admitting: Internal Medicine

## 2015-05-13 ENCOUNTER — Ambulatory Visit: Payer: Medicare Other | Admitting: Internal Medicine

## 2015-05-13 ENCOUNTER — Ambulatory Visit (INDEPENDENT_AMBULATORY_CARE_PROVIDER_SITE_OTHER): Payer: Medicare Other | Admitting: Adult Health

## 2015-05-13 ENCOUNTER — Encounter: Payer: Self-pay | Admitting: Adult Health

## 2015-05-13 VITALS — BP 140/70 | HR 77 | Temp 98.3°F | Wt 233.8 lb

## 2015-05-13 DIAGNOSIS — E1165 Type 2 diabetes mellitus with hyperglycemia: Secondary | ICD-10-CM

## 2015-05-13 DIAGNOSIS — E1140 Type 2 diabetes mellitus with diabetic neuropathy, unspecified: Secondary | ICD-10-CM | POA: Diagnosis not present

## 2015-05-13 DIAGNOSIS — H6121 Impacted cerumen, right ear: Secondary | ICD-10-CM | POA: Diagnosis not present

## 2015-05-13 DIAGNOSIS — Z794 Long term (current) use of insulin: Secondary | ICD-10-CM

## 2015-05-13 DIAGNOSIS — E785 Hyperlipidemia, unspecified: Secondary | ICD-10-CM | POA: Diagnosis not present

## 2015-05-13 DIAGNOSIS — IMO0002 Reserved for concepts with insufficient information to code with codable children: Secondary | ICD-10-CM

## 2015-05-13 DIAGNOSIS — I10 Essential (primary) hypertension: Secondary | ICD-10-CM

## 2015-05-13 NOTE — Patient Instructions (Addendum)
It was great meeting you today!  I will follow up with you regarding your blood work.   Your EKG looks fine  Work on diet and exercise.   Ask your insurance company about a long acting insuline called [facility]

## 2015-05-13 NOTE — Progress Notes (Signed)
Pre visit review using our clinic review tool, if applicable. No additional management support is needed unless otherwise documented below in the visit note. 

## 2015-05-13 NOTE — Progress Notes (Signed)
   Subjective:    Patient ID: Luis Yu, male    DOB: 11/28/1945, 70 y.o.   MRN: [id number]  HPI  70 year old male with history of coronary artery disease, type 2 diabetes that is uncontrolled, hypertension and hyperlipidemia who is in today for follow up.   He reports that the price of his insulin continues to rise, currently on Toujeo and Novalog. He is not always able to take his mealtime insuline due to price of medication. He denies any significant hypoglycemia.   He feels as though his blood pressure is well controlled on current therapy   Review of Systems  Constitutional: Negative for activity change, appetite change and fatigue.  HENT: Negative.   Respiratory: Negative.   Cardiovascular: Negative.   Gastrointestinal: Negative.   Endocrine: Negative.   Genitourinary: Negative.   Musculoskeletal: Positive for back pain and arthralgias.  Allergic/Immunologic: Negative.   Neurological: Negative.   Hematological: Negative.   Psychiatric/Behavioral: Negative.   All other systems reviewed and are negative.      Objective:   Physical Exam  Constitutional: He is oriented to person, place, and time. He appears well-developed and well-nourished. No distress.  HENT:  Head: Normocephalic and atraumatic.  Cerumen impaction in right ear canal.   Neck: Neck supple.  Cardiovascular: Normal rate, regular rhythm and normal heart sounds.  No murmur heard. Pulmonary/Chest: Effort normal and breath sounds normal. He has no wheezes.  Musculoskeletal: He exhibits no edema.  Lymphadenopathy:   He has no cervical adenopathy.  Neurological: He is alert and oriented to person, place, and time. No cranial nerve deficit.  Psychiatric: He has a normal mood and affect. His behavior is normal.     Assessment & Plan:  1. Essential hypertension  - EKG 12-Lead - Sinus Bradycardia, rate 53. Non specific T -abnormality. Consistent with previous EKG - Basic metabolic panel; Future - CBC  with Differential/Platelet - TSH - Hepatic function panel - Basic metabolic panel  2. Cerumen impaction, right - Able to remove cerumen impaction with irrigation  3. Hyperlipidemia - EKG 12-Lead - Lipid panel  4. Uncontrolled type 2 diabetes mellitus with diabetic neuropathy, with long-term current use of insulin (HCC) - EKG 12-Lead - Hemoglobin A1c - Will have patient check with insurance on what they cover for mealtime insulin as well as if they cover Basalglar.

## 2015-05-14 LAB — CBC WITH DIFFERENTIAL/PLATELET
Basophils Absolute: 0 10*3/uL (ref 0.0–0.1)
Basophils Relative: 0 % (ref 0–1)
Eosinophils Absolute: 0.2 10*3/uL (ref 0.0–0.7)
Eosinophils Relative: 4 % (ref 0–5)
HCT: 41.6 % (ref 39.0–52.0)
Hemoglobin: 14 g/dL (ref 13.0–17.0)
Lymphocytes Relative: 37 % (ref 12–46)
Lymphs Abs: 2.1 10*3/uL (ref 0.7–4.0)
MCH: 31.7 pg (ref 26.0–34.0)
MCHC: 33.7 g/dL (ref 30.0–36.0)
MCV: 94.1 fL (ref 78.0–100.0)
MPV: 11.1 fL (ref 8.6–12.4)
Monocytes Absolute: 0.5 10*3/uL (ref 0.1–1.0)
Monocytes Relative: 9 % (ref 3–12)
Neutro Abs: 2.8 10*3/uL (ref 1.7–7.7)
Neutrophils Relative %: 50 % (ref 43–77)
Platelets: 142 10*3/uL — ABNORMAL LOW (ref 150–400)
RBC: 4.42 MIL/uL (ref 4.22–5.81)
RDW: 14.5 % (ref 11.5–15.5)
WBC: 5.6 10*3/uL (ref 4.0–10.5)

## 2015-05-14 LAB — BASIC METABOLIC PANEL
BUN: 15 mg/dL (ref 7–25)
CO2: 27 mmol/L (ref 20–31)
Calcium: 9.3 mg/dL (ref 8.6–10.3)
Chloride: 104 mmol/L (ref 98–110)
Creat: 1.04 mg/dL (ref 0.70–1.18)
Glucose, Bld: 110 mg/dL — ABNORMAL HIGH (ref 65–99)
Potassium: 4.2 mmol/L (ref 3.5–5.3)
Sodium: 142 mmol/L (ref 135–146)

## 2015-05-14 LAB — HEPATIC FUNCTION PANEL
ALT: 16 U/L (ref 9–46)
AST: 16 U/L (ref 10–35)
Albumin: 3.7 g/dL (ref 3.6–5.1)
Alkaline Phosphatase: 82 U/L (ref 40–115)
Bilirubin, Direct: 0.2 mg/dL (ref <=0.2)
Indirect Bilirubin: 0.7 mg/dL (ref 0.2–1.2)
Total Bilirubin: 0.9 mg/dL (ref 0.2–1.2)
Total Protein: 6.1 g/dL (ref 6.1–8.1)

## 2015-05-14 LAB — LIPID PANEL
Cholesterol: 142 mg/dL (ref 125–200)
HDL: 54 mg/dL (ref >=40)
LDL Cholesterol: 73 mg/dL (ref <130)
Total CHOL/HDL Ratio: 2.6 Ratio (ref <=5.0)
Triglycerides: 77 mg/dL (ref <150)
VLDL: 15 mg/dL (ref <30)

## 2015-05-14 LAB — TSH: TSH: 1.72 mIU/L (ref 0.40–4.50)

## 2015-05-14 LAB — HEMOGLOBIN A1C
Hgb A1c MFr Bld: 9.7 % — ABNORMAL HIGH (ref <5.7)
Mean Plasma Glucose: 232 mg/dL — ABNORMAL HIGH (ref <117)

## 2015-05-17 ENCOUNTER — Other Ambulatory Visit: Payer: Self-pay | Admitting: Adult Health

## 2015-05-18 ENCOUNTER — Ambulatory Visit: Payer: Medicare Other | Admitting: Podiatry

## 2015-05-19 ENCOUNTER — Encounter: Payer: Self-pay | Admitting: Podiatry

## 2015-05-19 ENCOUNTER — Encounter: Payer: Self-pay | Admitting: Adult Health

## 2015-05-19 ENCOUNTER — Ambulatory Visit (INDEPENDENT_AMBULATORY_CARE_PROVIDER_SITE_OTHER): Payer: Medicare Other | Admitting: Adult Health

## 2015-05-19 ENCOUNTER — Ambulatory Visit (INDEPENDENT_AMBULATORY_CARE_PROVIDER_SITE_OTHER): Payer: Medicare Other | Admitting: Podiatry

## 2015-05-19 VITALS — BP 120/64 | Temp 98.3°F | Ht 74.0 in | Wt 236.2 lb

## 2015-05-19 DIAGNOSIS — E1165 Type 2 diabetes mellitus with hyperglycemia: Secondary | ICD-10-CM | POA: Diagnosis not present

## 2015-05-19 DIAGNOSIS — E1140 Type 2 diabetes mellitus with diabetic neuropathy, unspecified: Secondary | ICD-10-CM

## 2015-05-19 DIAGNOSIS — IMO0002 Reserved for concepts with insufficient information to code with codable children: Secondary | ICD-10-CM

## 2015-05-19 DIAGNOSIS — Z794 Long term (current) use of insulin: Secondary | ICD-10-CM

## 2015-05-19 DIAGNOSIS — H6983 Other specified disorders of Eustachian tube, bilateral: Secondary | ICD-10-CM

## 2015-05-19 DIAGNOSIS — M79609 Pain in unspecified limb: Principal | ICD-10-CM

## 2015-05-19 DIAGNOSIS — M79673 Pain in unspecified foot: Secondary | ICD-10-CM | POA: Diagnosis not present

## 2015-05-19 DIAGNOSIS — B351 Tinea unguium: Secondary | ICD-10-CM

## 2015-05-19 MED ORDER — GLIPIZIDE 10 MG PO TABS: 10.0000 mg | ORAL_TABLET | Freq: Two times a day (BID) | ORAL | Status: DC

## 2015-05-19 NOTE — Progress Notes (Signed)
Patient ID: Luis Yu, male   DOB: 08/05/1945, 70 y.o.   MRN: [id number] Complaint:  Visit Type: Patient returns to my office for continued preventative foot care services. Complaint: Patient states" my nails have grown long and thick and become painful to walk and wear shoes" Patient has been diagnosed with DM with neuropathy. The patient presents for preventative foot care services. No changes to ROS. Patient says he injured his second toe right foot near the nail on the tip of the toesecond right.  Podiatric Exam: Vascular: dorsalis pedis and posterior tibial pulses are palpable bilateral. Capillary return is immediate. Temperature gradient is WNL. Skin turgor WNL  Sensorium: Normal Semmes Weinstein monofilament test. Normal tactile sensation bilaterally. Nail Exam: Pt has thick disfigured discolored nails with subungual debris noted bilateral entire nail hallux through fifth toenails Ulcer Exam: There is no evidence of ulcer or pre-ulcerative changes or infection. Orthopedic Exam: Muscle tone and strength are WNL. No limitations in general ROM. No crepitus or effusions noted. Foot type and digits show no abnormalities. Bony prominences are unremarkable. Skin: No Porokeratosis. No infection or ulcers.  Ragged laceration distal nail medial border.    Diagnosis:  Onychomycosis, , Pain in right toe, pain in left toes  Treatment & Plan Procedures and Treatment: Consent by patient was obtained for treatment procedures. The patient understood the discussion of treatment and procedures well. All questions were answered thoroughly reviewed. Debridement of mycotic and hypertrophic toenails, 1 through 5 bilateral and clearing of subungual debris. No ulceration, no infection noted. Applied neosporin/DSD to help the laceration site heal with its own skin as opposed to removing the skin. Return Visit-Office Procedure: Patient instructed to return to the office for a follow up visit 3 months for continued  evaluation and treatment.    Helane Gunther DPM

## 2015-05-19 NOTE — Patient Instructions (Signed)
It was great seeing you again.   It looks like you have some fluid behind your ears. Use Flonase and sudafed for the next three days. If you continue to have that sound, please let me know  I sent in a prescription for Glipizide 10 mg, take twice a day. Monitor your blood sugars.   Follow up with me in 3 weeks.

## 2015-05-19 NOTE — Progress Notes (Signed)
Pre visit review using our clinic review tool, if applicable. No additional management support is needed unless otherwise documented below in the visit note. 

## 2015-05-19 NOTE — Progress Notes (Signed)
   Subjective:    Patient ID: Luis Yu, male    DOB: [birth year], 70 y.o.   MRN: [id number]  HPI  70 year old male who presents to the office today for an acute issue of " a flushing sound in my ears". He first noticed this sound about 3 days ago. His wife reports" He went running around the house because he thought he heard a gas leak or something." The sound was more pronounced when he covered his ears with his hands. He also reports that this sound has since ceased.   He denies any ear pain, discharge, feeling of ear fullness, sinus pain or pressure, headaches or dizziness.   Review of Systems  All other systems reviewed and are negative.      Objective:   Physical Exam  Constitutional: He is oriented to person, place, and time. He appears well-developed and well-nourished. No distress.  HENT:  Head: Normocephalic and atraumatic.  Right Ear: External ear normal.  Left Ear: External ear normal.  Nose: Nose normal.  Mouth/Throat: Oropharynx is clear and moist. No oropharyngeal exudate.  Trace fluid behind bilateral TM's. No signs of infection  Eyes: Conjunctivae are normal. Pupils are equal, round, and reactive to light. Left eye exhibits no discharge.  Neck: Normal range of motion. Neck supple.  Cardiovascular: Normal rate, regular rhythm, normal heart sounds and intact distal pulses.  Exam reveals no gallop and no friction rub.   No murmur heard. Pulmonary/Chest: Breath sounds normal. No respiratory distress. He has no wheezes. He has no rales.  Musculoskeletal: Normal range of motion. He exhibits no edema or tenderness.  Neurological: He is alert and oriented to person, place, and time.  Skin: Skin is warm and dry. No rash noted. He is not diaphoretic. No erythema. No pallor.  Psychiatric: He has a normal mood and affect. His behavior is normal. Judgment and thought content normal.  Nursing note and vitals reviewed.     Assessment & Plan:  1. Eustachian tube dysfunction,  bilateral - Unknown cause of the sound he is describing. May be related to effusion or could be pressure equalization.  - Try flonase and Sudafed for the next three days.   2. Uncontrolled type 2 diabetes mellitus with diabetic neuropathy, with long-term current use of insulin (HCC) - He is having a tough time affording insulin at this time.  - Will try him back on Glipizide to see how his sugars respond.  - glipiZIDE (GLUCOTROL) 10 MG tablet; Take 1 tablet (10 mg total) by mouth 2 (two) times daily before a meal.  Dispense: 60 tablet; Refill: 3 - Follow up in three weeks or sooner if needed

## 2015-06-09 ENCOUNTER — Telehealth: Payer: Self-pay | Admitting: Internal Medicine

## 2015-06-09 NOTE — Telephone Encounter (Signed)
Pt needs tier exceptions for the following medications bupropion hcl, toujeo and bystolic please call [facility] [phone]

## 2015-06-10 ENCOUNTER — Ambulatory Visit: Payer: Medicare Other | Admitting: Internal Medicine

## 2015-06-13 ENCOUNTER — Other Ambulatory Visit: Payer: Self-pay | Admitting: Internal Medicine

## 2015-06-16 ENCOUNTER — Encounter: Payer: Self-pay | Admitting: Adult Health

## 2015-06-16 ENCOUNTER — Ambulatory Visit (INDEPENDENT_AMBULATORY_CARE_PROVIDER_SITE_OTHER): Payer: Medicare Other | Admitting: Adult Health

## 2015-06-16 VITALS — BP 144/72 | Temp 98.0°F | Ht 74.0 in | Wt 237.1 lb

## 2015-06-16 DIAGNOSIS — E1165 Type 2 diabetes mellitus with hyperglycemia: Secondary | ICD-10-CM | POA: Diagnosis not present

## 2015-06-16 DIAGNOSIS — E1140 Type 2 diabetes mellitus with diabetic neuropathy, unspecified: Secondary | ICD-10-CM

## 2015-06-16 DIAGNOSIS — R079 Chest pain, unspecified: Secondary | ICD-10-CM | POA: Diagnosis not present

## 2015-06-16 DIAGNOSIS — Z794 Long term (current) use of insulin: Secondary | ICD-10-CM

## 2015-06-16 DIAGNOSIS — IMO0002 Reserved for concepts with insufficient information to code with codable children: Secondary | ICD-10-CM

## 2015-06-16 NOTE — Patient Instructions (Signed)
It was great seeing you again.  You really need to start working on your diet. All the medication in the world won't do a thing,if you are not doing your part.  Follow up with me for your wellness exam and then an appointment in 2 months.

## 2015-06-16 NOTE — Progress Notes (Signed)
Subjective:    Patient ID: Luis Yu, male    DOB: 04-05-1945, 70 y.o.   MRN: [id number]  HPI   70 -year-old male, presents to the office today for 3 week follow-up. Last time I saw him approximately 3 weeks ago his blood sugars were uncontrolled with his last A1c being 9.7.  Is having a hard time affording his insulin, and continues to not follow specific diet, he has a lot of sweets and a lot of carbs.  I had started him on 10 mg of glipizide twice a day.  As wife reports that his blood sugars have been "a lot more managed".  He reports blood sugars below 150 multiple days out of the week this was not the case prior to starting glipizide.  He denies any adverse effects since starting glipizide.   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  He does have a history of rib fracture "it feels like a broken other rib". He denies any bruising to the area  Review of Systems  Constitutional: Negative.   HENT: Negative.   Respiratory: Negative.   Cardiovascular: Positive for chest pain.  Gastrointestinal: Negative.   Musculoskeletal: Positive for myalgias.  Skin: Negative.   Neurological: Negative.   Hematological: Negative.   All other systems reviewed and are negative.  Past Medical History  Diagnosis Date  . Depression   . Hypertension   . Hyperlipidemia   . GERD (gastroesophageal reflux disease)   . CAD (coronary artery disease)   . Osteoarthritis   . Postherpetic neuralgia   . Herpes zoster   . Adjustment disorder with mixed emotional features   . Diabetic peripheral neuropathy (HCC)   . Erectile dysfunction   . Obesity   . Diabetes mellitus type II, uncontrolled (HCC)     Social History   Social History  . Marital Status: Married    Spouse Name: [name]  . Number of Children:  1  . Years of Education: 13   Occupational History  . Retired Naval architecttruck driver    Social History Main Topics  . Smoking status: Never Smoker   . Smokeless tobacco: Never Used  . Alcohol Use: 1.2 oz/week    2 Standard drinks or equivalent per week     Comment: twice a month.  . Drug Use: No  . Sexual Activity:    Partners: Female   Other Topics Concern  . Not on file   Social History Narrative   Married [name]([name])   Regular exercise: none   Caffeine use: cup of coffee daily    Retired Naval architecttruck driver   Caffeine- twice daily             Past Surgical History  Procedure Laterality Date  . Coronary artery bypass graft  1996    eith a LIMA to the LAD, saphenous vein graft to the acute marginal, saphenous vein graft to the PDA and saphenous vein graft to the circumflex.  . Knee arthroscopy  05/2004    right knee  . Replacement total knee bilateral    . Shoulder surgery      Family History  Problem Relation Age of Onset  . Coronary artery disease Mother     deceased at 4875  . Coronary artery disease Father  alive  . Stroke      half brother  . Stroke Brother     No Known Allergies  Current Outpatient Prescriptions on File Prior to Visit  Medication Sig Dispense Refill  . aspirin 325 MG EC tablet Take 325 mg by mouth daily.      [name] atorvastatin (LIPITOR) 80 MG tablet Take 1 tablet (80 mg total) by mouth daily. 90 tablet 1  . buPROPion (WELLBUTRIN XL) 300 MG 24 hr tablet Take 1 tablet (300 mg total) by mouth daily. 90 tablet 3  . Cyanocobalamin (B-12) 5000 MCG SUBL Place 1 tablet under the tongue once a week.    . fluticasone (FLONASE) 50 MCG/ACT nasal spray Place 2 sprays into both nostrils daily.    [name] gabapentin (NEURONTIN) 300 MG capsule TAKE ONE CAPSULE EVERY MORNING AND TAKE 2 CAPSULES EVERY EVENING 90 capsule 5  . glipiZIDE (GLUCOTROL) 10 MG tablet Take 1 tablet (10 mg total) by mouth 2 (two) times daily before a meal. 60 tablet 3  . glucose blood (ACCU-CHEK ACTIVE  STRIPS) test strip Use as instructed 100 each 12  . insulin aspart (NOVOLOG) 100 UNIT/ML FlexPen Use 15 to 30 units 15 minutes before meals 3 times daily 2 pen 0  . Insulin Glargine (TOUJEO SOLOSTAR) 300 UNIT/ML SOPN Inject 30 Units into the skin at bedtime. 2 pen 0  . Insulin Pen Needle (PEN NEEDLES 31GX5/16") 31G X 8 MM MISC Use to inject insulin 5 times a day. 100 each 11  . Lancets (ACCU-CHEK MULTICLIX) lancets Use up to 5 times a day to check blood sugar. 100 each 5  . lisinopril-hydrochlorothiazide (PRINZIDE,ZESTORETIC) 20-12.5 MG tablet TAKE 1 TABLET BY MOUTH EVERY DAY 90 tablet 1  . metFORMIN (GLUCOPHAGE) 1000 MG tablet TAKE ONE TABLET BY MOUTH TWICE A DAY WITH MEALS. 60 tablet 4  . nebivolol (BYSTOLIC) 10 MG tablet Take 1 tablet (10 mg total) by mouth daily. 90 tablet 3  . omeprazole (PRILOSEC) 20 MG capsule TAKE 1 CAPSULE (20 MG TOTAL) BY MOUTH 2 (TWO) TIMES DAILY BEFORE A MEAL. 180 capsule 0  . polyethylene glycol (MIRALAX / GLYCOLAX) packet Take 17 g by mouth daily. 17 grams mixed with water two times a day.      No current facility-administered medications on file prior to visit.    BP 144/72 mmHg  Temp(Src) 98 F (36.7 C) (Oral)  Ht  (1.88 m)  Wt 237 lb 1.6 oz (107.548 kg)  BMI 30.43 kg/m2       Objective:   Physical Exam  Constitutional: He is oriented to person, place, and time. He appears well-developed and well-nourished. No distress.  Cardiovascular: Normal rate, regular rhythm, normal heart sounds and intact distal pulses.  Exam reveals no gallop and no friction rub.   No murmur heard. Pulmonary/Chest: Effort normal and breath sounds normal. No respiratory distress. He has no wheezes. He has no rales. He exhibits no tenderness.  Musculoskeletal: Normal range of motion. He exhibits tenderness. He exhibits no edema.       Left shoulder: He exhibits tenderness. He exhibits no bony tenderness, no swelling, no effusion, no crepitus and no deformity.        Arms: Neurological: He is alert and oriented to person, place, and time.  Skin: Skin is warm and dry. No rash noted. He is not diaphoretic. No erythema. No pallor.  Psychiatric: He has a normal mood and affect. His behavior is normal. Judgment and thought content normal.  Nursing note  and vitals reviewed.     Assessment & Plan:  1. Uncontrolled type 2 diabetes mellitus with diabetic neuropathy, with long-term current use of insulin (HCC) -  Going to keep patient on glipizide 10 mg twice a day, follow-up in 2 months for repeat A1c. -  Educated on the importance of diet and exercise, he needs to cut out carbohydrates and sweets in order to bring his blood sugars down 2. Chest pain, unspecified chest pain type - DG Chest 2 View; Future - likely bruised, ice and ibuprofen 600 mg every 6-8 hours

## 2015-06-21 ENCOUNTER — Ambulatory Visit (INDEPENDENT_AMBULATORY_CARE_PROVIDER_SITE_OTHER)
Admission: RE | Admit: 2015-06-21 | Discharge: 2015-06-21 | Disposition: A | Discharge status: Home / Self Care | Payer: Medicare Other | Source: Ambulatory Visit | Attending: Adult Health | Admitting: Adult Health

## 2015-06-21 DIAGNOSIS — R079 Chest pain, unspecified: Secondary | ICD-10-CM

## 2015-06-21 DIAGNOSIS — R0602 Shortness of breath: Secondary | ICD-10-CM | POA: Diagnosis not present

## 2015-06-21 DIAGNOSIS — R05 Cough: Secondary | ICD-10-CM | POA: Diagnosis not present

## 2015-07-04 ENCOUNTER — Other Ambulatory Visit: Payer: Self-pay | Admitting: Internal Medicine

## 2015-07-12 ENCOUNTER — Other Ambulatory Visit: Payer: Self-pay | Admitting: Internal Medicine

## 2015-07-19 ENCOUNTER — Other Ambulatory Visit: Payer: Self-pay | Admitting: Internal Medicine

## 2015-07-19 NOTE — Telephone Encounter (Signed)
Pt is out of insulin per pharm

## 2015-07-21 ENCOUNTER — Telehealth: Payer: Self-pay | Admitting: Internal Medicine

## 2015-07-21 MED ORDER — "PEN NEEDLES 5/16"" 31G X 8 MM MISC": Status: DC

## 2015-07-21 MED ORDER — INSULIN GLARGINE 300 UNIT/ML ~~LOC~~ SOPN: 30.0000 [IU] | PEN_INJECTOR | Freq: Every day | SUBCUTANEOUS | Status: DC

## 2015-07-21 MED ORDER — INSULIN ASPART 100 UNIT/ML FLEXPEN: PEN_INJECTOR | SUBCUTANEOUS | Status: DC

## 2015-07-21 NOTE — Telephone Encounter (Signed)
Pt need new Rx for Novolog, Toujeo and insulin pen needles.  #90 supply for all.    Pharm:  [facility]  [phone]) [phone]

## 2015-07-21 NOTE — Telephone Encounter (Signed)
Pharmacy said they did not received the following med insulin aspart (NOVOLOG) 100 UNIT/ML FlexPen    [facility] [place]

## 2015-07-22 MED ORDER — INSULIN ASPART 100 UNIT/ML FLEXPEN: PEN_INJECTOR | SUBCUTANEOUS | Status: DC

## 2015-08-13 DIAGNOSIS — K219 Gastro-esophageal reflux disease without esophagitis: Secondary | ICD-10-CM | POA: Diagnosis not present

## 2015-08-13 DIAGNOSIS — E1140 Type 2 diabetes mellitus with diabetic neuropathy, unspecified: Secondary | ICD-10-CM | POA: Diagnosis not present

## 2015-08-13 DIAGNOSIS — Z96653 Presence of artificial knee joint, bilateral: Secondary | ICD-10-CM | POA: Diagnosis not present

## 2015-08-13 DIAGNOSIS — I10 Essential (primary) hypertension: Secondary | ICD-10-CM | POA: Diagnosis not present

## 2015-08-13 DIAGNOSIS — E785 Hyperlipidemia, unspecified: Secondary | ICD-10-CM | POA: Diagnosis not present

## 2015-08-13 DIAGNOSIS — R079 Chest pain, unspecified: Secondary | ICD-10-CM | POA: Diagnosis not present

## 2015-08-13 DIAGNOSIS — R072 Precordial pain: Secondary | ICD-10-CM | POA: Diagnosis not present

## 2015-08-13 DIAGNOSIS — I214 Non-ST elevation (NSTEMI) myocardial infarction: Secondary | ICD-10-CM | POA: Diagnosis not present

## 2015-08-14 DIAGNOSIS — E785 Hyperlipidemia, unspecified: Secondary | ICD-10-CM | POA: Diagnosis not present

## 2015-08-14 DIAGNOSIS — Z66 Do not resuscitate: Secondary | ICD-10-CM | POA: Diagnosis present

## 2015-08-14 DIAGNOSIS — R079 Chest pain, unspecified: Secondary | ICD-10-CM | POA: Diagnosis not present

## 2015-08-14 DIAGNOSIS — E1140 Type 2 diabetes mellitus with diabetic neuropathy, unspecified: Secondary | ICD-10-CM | POA: Diagnosis present

## 2015-08-14 DIAGNOSIS — R072 Precordial pain: Secondary | ICD-10-CM | POA: Diagnosis not present

## 2015-08-14 DIAGNOSIS — E119 Type 2 diabetes mellitus without complications: Secondary | ICD-10-CM | POA: Diagnosis not present

## 2015-08-14 DIAGNOSIS — Z9889 Other specified postprocedural states: Secondary | ICD-10-CM | POA: Diagnosis not present

## 2015-08-14 DIAGNOSIS — F329 Major depressive disorder, single episode, unspecified: Secondary | ICD-10-CM | POA: Diagnosis present

## 2015-08-14 DIAGNOSIS — I25110 Atherosclerotic heart disease of native coronary artery with unstable angina pectoris: Secondary | ICD-10-CM | POA: Diagnosis present

## 2015-08-14 DIAGNOSIS — Z951 Presence of aortocoronary bypass graft: Secondary | ICD-10-CM | POA: Diagnosis not present

## 2015-08-14 DIAGNOSIS — K219 Gastro-esophageal reflux disease without esophagitis: Secondary | ICD-10-CM | POA: Diagnosis not present

## 2015-08-14 DIAGNOSIS — I10 Essential (primary) hypertension: Secondary | ICD-10-CM | POA: Diagnosis not present

## 2015-08-14 DIAGNOSIS — Z7984 Long term (current) use of oral hypoglycemic drugs: Secondary | ICD-10-CM | POA: Diagnosis not present

## 2015-08-14 DIAGNOSIS — Z8249 Family history of ischemic heart disease and other diseases of the circulatory system: Secondary | ICD-10-CM | POA: Diagnosis not present

## 2015-08-14 DIAGNOSIS — Z96653 Presence of artificial knee joint, bilateral: Secondary | ICD-10-CM | POA: Diagnosis present

## 2015-08-14 DIAGNOSIS — I214 Non-ST elevation (NSTEMI) myocardial infarction: Secondary | ICD-10-CM | POA: Diagnosis not present

## 2015-08-14 DIAGNOSIS — Z7982 Long term (current) use of aspirin: Secondary | ICD-10-CM | POA: Diagnosis not present

## 2015-08-14 DIAGNOSIS — Z794 Long term (current) use of insulin: Secondary | ICD-10-CM | POA: Diagnosis not present

## 2015-08-16 ENCOUNTER — Ambulatory Visit: Payer: Medicare Other | Admitting: Adult Health

## 2015-08-17 ENCOUNTER — Telehealth: Payer: Self-pay | Admitting: Cardiology

## 2015-08-17 NOTE — Telephone Encounter (Signed)
SPOKE TO WIFE  PATIENT HAD " MI" IN [place] - NEEDED F/U WITH DR CRENSHAW  PATIENT AND WIFE ARE IN [place] AND WILL NOT BE BACK UNTIL THE WEEKEND OF THE 09/10/15  OFFERED APPT.  W/ EXTENDER - EARLIEST APPT. WITH DR CRENSHAW IS 7/101/7  WIFE WANTED TO KEEP 7/10 APPT.  BUT ALSO WANTED AN APPT. WITH A EXTENDER THE WEEK THEY CAME BACK   APPT. SCHEDULE FOR 09/13/15 AT 9:30

## 2015-08-17 NOTE — Telephone Encounter (Signed)
New Message  Pt wife called to speak w/ RN- [facility] [facility] wanted to discuss w/ RN. Please call back and discuss.

## 2015-08-18 ENCOUNTER — Ambulatory Visit: Payer: Medicare Other | Admitting: Podiatry

## 2015-08-19 NOTE — Telephone Encounter (Signed)
CALLED BOTH CELL'S PHONE- NO ANSWER   LMTCB

## 2015-08-22 NOTE — Telephone Encounter (Signed)
Spoke to wife - appointment given Wife request to move appointment to another day. Due to the patient coming to town on the 09/16/15.  Reschedule appointment to 09/16/15 at 3 pm  Bjorn Loser( [place]) Wife verbalized understanding

## 2015-08-22 NOTE — Telephone Encounter (Signed)
Left message to call back  

## 2015-09-06 ENCOUNTER — Other Ambulatory Visit: Payer: Self-pay | Admitting: Adult Health

## 2015-09-06 NOTE — Telephone Encounter (Signed)
Refill sent to pharmacy.   

## 2015-09-08 DIAGNOSIS — H6123 Impacted cerumen, bilateral: Secondary | ICD-10-CM | POA: Diagnosis not present

## 2015-09-13 ENCOUNTER — Ambulatory Visit: Payer: Medicare Other | Admitting: Adult Health

## 2015-09-13 ENCOUNTER — Ambulatory Visit: Payer: Medicare Other | Admitting: Cardiology

## 2015-09-14 ENCOUNTER — Ambulatory Visit: Payer: Medicare Other | Admitting: Adult Health

## 2015-09-16 ENCOUNTER — Ambulatory Visit: Payer: Medicare Other | Admitting: Physician Assistant

## 2015-09-16 ENCOUNTER — Ambulatory Visit: Payer: Medicare Other | Admitting: Adult Health

## 2015-09-18 DIAGNOSIS — K0510 Chronic gingivitis, plaque induced: Secondary | ICD-10-CM | POA: Diagnosis not present

## 2015-09-18 DIAGNOSIS — E119 Type 2 diabetes mellitus without complications: Secondary | ICD-10-CM | POA: Diagnosis not present

## 2015-09-18 DIAGNOSIS — E669 Obesity, unspecified: Secondary | ICD-10-CM | POA: Diagnosis not present

## 2015-09-18 DIAGNOSIS — K0889 Other specified disorders of teeth and supporting structures: Secondary | ICD-10-CM | POA: Diagnosis not present

## 2015-09-19 ENCOUNTER — Other Ambulatory Visit: Payer: Self-pay | Admitting: Internal Medicine

## 2015-09-22 ENCOUNTER — Ambulatory Visit: Payer: Medicare Other | Admitting: Podiatry

## 2015-09-26 ENCOUNTER — Ambulatory Visit: Payer: Medicare Other | Admitting: Cardiology

## 2015-09-29 ENCOUNTER — Ambulatory Visit: Payer: Medicare Other | Admitting: Cardiology

## 2015-09-30 ENCOUNTER — Ambulatory Visit: Payer: Medicare Other | Admitting: Adult Health

## 2015-10-01 DIAGNOSIS — J069 Acute upper respiratory infection, unspecified: Secondary | ICD-10-CM | POA: Diagnosis not present

## 2015-10-01 DIAGNOSIS — R600 Localized edema: Secondary | ICD-10-CM | POA: Diagnosis not present

## 2015-10-01 DIAGNOSIS — R05 Cough: Secondary | ICD-10-CM | POA: Diagnosis not present

## 2015-10-01 DIAGNOSIS — R509 Fever, unspecified: Secondary | ICD-10-CM | POA: Diagnosis not present

## 2015-10-13 ENCOUNTER — Ambulatory Visit: Payer: Medicare Other | Admitting: Podiatry

## 2015-10-13 ENCOUNTER — Encounter: Payer: Self-pay | Admitting: Cardiology

## 2015-10-13 ENCOUNTER — Encounter: Payer: Self-pay | Admitting: Cardiovascular Disease

## 2015-10-14 ENCOUNTER — Encounter: Payer: Self-pay | Admitting: Cardiology

## 2015-10-14 ENCOUNTER — Encounter (INDEPENDENT_AMBULATORY_CARE_PROVIDER_SITE_OTHER): Payer: Self-pay

## 2015-10-14 ENCOUNTER — Ambulatory Visit: Payer: Medicare Other | Admitting: Adult Health

## 2015-10-14 ENCOUNTER — Ambulatory Visit (INDEPENDENT_AMBULATORY_CARE_PROVIDER_SITE_OTHER): Payer: Medicare Other | Admitting: Cardiology

## 2015-10-14 VITALS — BP 120/76 | HR 68 | Ht 74.0 in | Wt 238.0 lb

## 2015-10-14 DIAGNOSIS — I4892 Unspecified atrial flutter: Secondary | ICD-10-CM

## 2015-10-14 DIAGNOSIS — I25119 Atherosclerotic heart disease of native coronary artery with unspecified angina pectoris: Secondary | ICD-10-CM | POA: Diagnosis not present

## 2015-10-14 DIAGNOSIS — R5382 Chronic fatigue, unspecified: Secondary | ICD-10-CM

## 2015-10-14 LAB — CBC
HCT: 39.7 % (ref 38.5–50.0)
Hemoglobin: 12.9 g/dL — ABNORMAL LOW (ref 13.2–17.1)
MCH: 30.4 pg (ref 27.0–33.0)
MCHC: 32.5 g/dL (ref 32.0–36.0)
MCV: 93.4 fL (ref 80.0–100.0)
MPV: 11 fL (ref 7.5–12.5)
Platelets: 142 10*3/uL (ref 140–400)
RBC: 4.25 MIL/uL (ref 4.20–5.80)
RDW: 16.3 % — ABNORMAL HIGH (ref 11.0–15.0)
WBC: 6.3 10*3/uL (ref 3.8–10.8)

## 2015-10-14 MED ORDER — ASPIRIN EC 81 MG PO TBEC: 81.0000 mg | DELAYED_RELEASE_TABLET | Freq: Every day | ORAL | Status: DC

## 2015-10-14 MED ORDER — ISOSORBIDE MONONITRATE ER 30 MG PO TB24: 30.0000 mg | ORAL_TABLET | Freq: Every day | ORAL | Status: DC

## 2015-10-14 MED ORDER — APIXABAN 5 MG PO TABS: 5.0000 mg | ORAL_TABLET | Freq: Two times a day (BID) | ORAL | 6 refills | Status: DC

## 2015-10-14 NOTE — Patient Instructions (Signed)
Medication Instructions:  1. INCREASE Imdur to 1 tablet (30mg ) by mouth once a day. 2. STOP Plavix 3. Continue Asprin 81mg  take 1 tab by mouth daily 4. START Eliquis take 1 tab (5mg ) by mouth twice a day.  Labwork: TODAY: BMET, CBC, TSH  Testing/Procedures: Your physician has requested that you have a lexiscan myoview. For further information please visit [email or web address] Please follow instruction sheet, as given.  Follow-Up: Your physician recommends that you schedule a follow-up appointment in: 2 WEEKS WITH DR CRENSHAW OR WITH PA/NP ON A DAY DR CRENSHAW IS IN THE OFFICE.   Any Other Special Instructions Will Be Listed Below (If Applicable).     If you need a refill on your cardiac medications before your next appointment, please call your pharmacy.

## 2015-10-14 NOTE — Progress Notes (Signed)
10/14/2015 Luis Yu   Aug 12, 1945  [id number]  Primary Physician Thomos Lemons, DO Primary Cardiologist: Dr. Jens Som   Reason for Visit/CC: F/u for CAD; new onset Atrial fibrillation   HPI:  70 y/o male, followed by Dr. Jens Som. He has not been seen since 2014. He spends a great deal of time at his [place] in [facility]. He has a h/o CAD. S/p remote h/o CABG x 3.  A cardiac catheterization in May of 2011 revealed triple-vessel coronary artery disease status post three-vessel CABG with 3/3 patent grafts. Severe stenosis in the proximal and distal body of the saphenous vein graft to the left-sided PDA. Normal left ventricular systolic function. Patient had PCI with a drug-eluting stent at that time. Had repeat cardiac cath in Dec of 2011 for dyspnea and f/u abnormal myoview. This revealed an EF of 50%. Continued patency of internal mammary to the LAD. Occluded saphenous vein graft to the percutaneous intervention to the distal circumflex/PDA territory. Progressive disease in the saphenous vein graft to the OM. Patient was evaluated by CVTS. Distal vessels were felt to be poor targets. CABG could be considered in the future if PCI options were exhausted. Patient had PCI of the saphenous vein graft to the obtuse marginal. Last seen by Dr. Jens Som in 2014 and felt to be stable.   He was recently admitted to [facility] in [facility] in 07/2015 for chest pain. Unfortunately, I do not have access to these records at this time. The patient reports this was of acute onset. Felt like an elephant was sitting on his chest. He reports he ruled in for MI with abnormal enzymes. He reports no LHC was performed (was told in the past that he had poor targets for PCI). He was treated medically. He was placed on Plavix and low dose Imdur, 15 mg daily.   In addition, 3 weeks ago, he went to an urgent care for dyspnea + wheezing. It was felt that he had bronchitis. An EKG was obtained which showed new onset  atrial fibrillation with a CVR in the 60s. This was a new diagnosis for him. He is advised to follow up in our clinic for further recommendations.  He is here today with his wife. He continues to have recurrent CP.  Described as substernal chest pressure. Occurs at rest about 2-3 times a week. He has mild dyspnea with this. Symptoms are relieved with sublingual nitroglycerin. He is currently chest pain-free. EKG today shows atrial flutter with controlled ventricular response in the 60s.     Current Outpatient Prescriptions  Medication Sig Dispense Refill  . APIDRA SOLOSTAR 100 UNIT/ML Solostar Pen INJECT 15 TO 30 UNITS INTO THE SKIN 3 TIMES DAILY 30 pen 0  . aspirin 325 MG EC tablet Take 325 mg by mouth daily.      [name] atorvastatin (LIPITOR) 80 MG tablet Take 1 tablet (80 mg total) by mouth daily. 90 tablet 1  . buPROPion (WELLBUTRIN XL) 300 MG 24 hr tablet Take 1 tablet (300 mg total) by mouth daily. 90 tablet 3  . BYSTOLIC 10 MG tablet TAKE 1 TABLET BY MOUTH DAILY 90 tablet 0  . clopidogrel (PLAVIX) 75 MG tablet Take 75 mg by mouth daily.    . Cyanocobalamin (B-12) 5000 MCG SUBL Place 1 tablet under the tongue once a week.    . fluticasone (FLONASE) 50 MCG/ACT nasal spray Place 2 sprays into both nostrils daily.    [name] gabapentin (NEURONTIN) 300 MG capsule  TAKE ONE CAPSULE EVERY MORNING AND TAKE 2 CAPSULES EVERY EVENING 90 capsule 5  . glipiZIDE (GLUCOTROL) 10 MG tablet TAKE 1 TABLET (10 MG TOTAL) BY MOUTH 2 (TWO) TIMES DAILY BEFORE A MEAL. 60 tablet 0  . glucose blood (ACCU-CHEK ACTIVE STRIPS) test strip Use as instructed 100 each 12  . insulin aspart (NOVOLOG) 100 UNIT/ML FlexPen Use 15 to 30 units 15 minutes before meals 3 times daily 5 pen 0  . Insulin Pen Needle (PEN NEEDLES 31GX5/16") 31G X 8 MM MISC Use to inject insulin 5 times a day. 100 each 11  . isosorbide mononitrate (IMDUR) 30 MG 24 hr tablet Take 15 mg by mouth daily.    . Lancets (ACCU-CHEK MULTICLIX) lancets Use up to 5 times a  day to check blood sugar. 100 each 5  . lisinopril-hydrochlorothiazide (PRINZIDE,ZESTORETIC) 20-12.5 MG tablet TAKE 1 TABLET BY MOUTH EVERY DAY 90 tablet 1  . metFORMIN (GLUCOPHAGE) 1000 MG tablet TAKE ONE TABLET BY MOUTH TWICE A DAY WITH MEALS. 60 tablet 4  . omeprazole (PRILOSEC) 20 MG capsule TAKE 1 CAPSULE (20 MG TOTAL) BY MOUTH 2 (TWO) TIMES DAILY BEFORE A MEAL. 180 capsule 0  . polyethylene glycol (MIRALAX / GLYCOLAX) packet Take 17 g by mouth daily. 17 grams mixed with water two times a day.     Nathen May SOLOSTAR 300 UNIT/ML SOPN INJECT 30 UNITS INTO THE SKIN AT BEDTIME. 4.5 mL 1   No current facility-administered medications for this visit.     No Known Allergies  Social History   Social History  . Marital status: Married    Spouse name: [name]  . Number of children: 1  . Years of education: 30   Occupational History  . Retired Naval architect Retired   Social History Main Topics  . Smoking status: Never Smoker  . Smokeless tobacco: Never Used  . Alcohol use 1.2 oz/week    2 Standard drinks or equivalent per week     Comment: twice a month.  . Drug use: No  . Sexual activity: Yes    Partners: Female   Other Topics Concern  . Not on file   Social History Narrative   Married [name])   Regular exercise: none   Caffeine use: cup of coffee daily    Retired Naval architect   Caffeine- twice daily              Review of Systems: General: negative for chills, fever, night sweats or weight changes.  Cardiovascular: negative for chest pain, dyspnea on exertion, edema, orthopnea, palpitations, paroxysmal nocturnal dyspnea or shortness of breath Dermatological: negative for rash Respiratory: negative for cough or wheezing Urologic: negative for hematuria Abdominal: negative for nausea, vomiting, diarrhea, bright red blood per rectum, melena, or hematemesis Neurologic: negative for visual changes, syncope, or dizziness All other systems reviewed and are otherwise negative  except as noted above.    Blood pressure 120/76, pulse 68, height  (1.88 m), weight 238 lb (108 kg).  General appearance: alert, cooperative and no distress Neck: no carotid bruit and no JVD Lungs: clear to auscultation bilaterally Heart: regular rate, irregular rhythm  Extremities: no LEE Pulses: 2+ and symmetric Skin: warm and dry Neurologic: Grossly normal  EKG atrial flutter with CVR  ASSESSMENT AND PLAN:   1. CAD: still with recurrent CP 2-3 times a week. ? underlygin ischemia vs symptomatic atrial fibrillation. Will increase imdur to 30 mg daily. Discussed with Dr. Ladona Ridgel, DOD. We will obtain a NST  to assess for ischemia.   2. New Onset PAF: discovered 3 weeks ago by EKG at urgent care. Patient was suspected to have bronchitis at the time. EKG today shows atrial flutter with a CVR. Discussed with Dr. Ladona Ridgel, DOD. We will stop Plavix. Continue low dose ASA. Start Eliquis, 5 mg BID, for stroke prophylaxis. Continue Bystolic for rate control. Check TSH, CBC and BMP. Dr. Ladona Ridgel has recommended NST to assess for coronary ischemia. F/u with Dr. Jens Som in 2 weeks.    Robbie Lis PA-C 10/14/2015 3:52 PM

## 2015-10-15 LAB — TSH: TSH: 1.15 mIU/L (ref 0.40–4.50)

## 2015-10-15 LAB — BASIC METABOLIC PANEL
BUN: 27 mg/dL — ABNORMAL HIGH (ref 7–25)
CO2: 27 mmol/L (ref 20–31)
Calcium: 9.3 mg/dL (ref 8.6–10.3)
Chloride: 107 mmol/L (ref 98–110)
Creat: 1.23 mg/dL — ABNORMAL HIGH (ref 0.70–1.18)
Glucose, Bld: 278 mg/dL — ABNORMAL HIGH (ref 65–99)
Potassium: 5.2 mmol/L (ref 3.5–5.3)
Sodium: 144 mmol/L (ref 135–146)

## 2015-10-17 ENCOUNTER — Telehealth: Payer: Self-pay | Admitting: Cardiology

## 2015-10-17 NOTE — Telephone Encounter (Signed)
Spoke w  Mrs. Selvy -regarding recent labwork. Pt needs to know if based on tests, OK to start Eliquis he was prescribed at [facility] on 7/28. Will route to provider to review labs.  Caller aware we will follow up w physician recommendations.

## 2015-10-17 NOTE — Telephone Encounter (Signed)
Ok to start apixaban 5 bid.

## 2015-10-17 NOTE — Telephone Encounter (Signed)
Spoke with pt wife, Aware of dr crenshaw's recommendations.  

## 2015-10-17 NOTE — Telephone Encounter (Signed)
New message   Pt wife verbalized that she is calling for the result of his lab work

## 2015-10-18 ENCOUNTER — Telehealth: Payer: Self-pay | Admitting: *Deleted

## 2015-10-18 ENCOUNTER — Ambulatory Visit: Payer: Medicare Other | Admitting: Adult Health

## 2015-10-18 DIAGNOSIS — D649 Anemia, unspecified: Secondary | ICD-10-CM

## 2015-10-18 NOTE — Telephone Encounter (Signed)
Pt wife, [name], pt gave verbally permission to speak with her, she has been made aware of pts lab results.  She has been advised that pt needed to drink plenty of fluids, and repeat lab work 8/269/17 due to starting on Eliquis. She verbalized understanding. Order in EPIC.

## 2015-10-18 NOTE — Telephone Encounter (Signed)
-----   Message from [place], [place] sent at 10/18/2015  1:33 PM EDT ----- Thyroid function is normal. Potassium is normal. Renal function suggest possible mild dehydration. Make sure patient is staying well hydrated with fluids. He also has mild anemia with Hgb of 12.9. Since we just started Eliquis for atrial flutter, we will need to repeat CBC in 4 weeks to reassess hgb.

## 2015-10-19 ENCOUNTER — Telehealth (HOSPITAL_COMMUNITY): Payer: Self-pay

## 2015-10-19 NOTE — Telephone Encounter (Signed)
Encounter complete. 

## 2015-10-20 ENCOUNTER — Ambulatory Visit (HOSPITAL_COMMUNITY)
Admission: RE | Admit: 2015-10-20 | Discharge: 2015-10-20 | Disposition: A | Discharge status: Home / Self Care | Payer: Medicare Other | Source: Ambulatory Visit | Attending: Cardiology | Admitting: Cardiology

## 2015-10-20 ENCOUNTER — Telehealth: Payer: Self-pay | Admitting: Pulmonary Disease

## 2015-10-20 ENCOUNTER — Telehealth: Payer: Self-pay | Admitting: *Deleted

## 2015-10-20 DIAGNOSIS — R5383 Other fatigue: Secondary | ICD-10-CM | POA: Insufficient documentation

## 2015-10-20 DIAGNOSIS — R0609 Other forms of dyspnea: Secondary | ICD-10-CM | POA: Diagnosis not present

## 2015-10-20 DIAGNOSIS — E663 Overweight: Secondary | ICD-10-CM | POA: Insufficient documentation

## 2015-10-20 DIAGNOSIS — R0602 Shortness of breath: Secondary | ICD-10-CM | POA: Diagnosis not present

## 2015-10-20 DIAGNOSIS — Z8249 Family history of ischemic heart disease and other diseases of the circulatory system: Secondary | ICD-10-CM | POA: Insufficient documentation

## 2015-10-20 DIAGNOSIS — Z6830 Body mass index (BMI) 30.0-30.9, adult: Secondary | ICD-10-CM | POA: Insufficient documentation

## 2015-10-20 DIAGNOSIS — R42 Dizziness and giddiness: Secondary | ICD-10-CM | POA: Diagnosis not present

## 2015-10-20 DIAGNOSIS — R002 Palpitations: Secondary | ICD-10-CM | POA: Diagnosis not present

## 2015-10-20 DIAGNOSIS — E119 Type 2 diabetes mellitus without complications: Secondary | ICD-10-CM | POA: Insufficient documentation

## 2015-10-20 DIAGNOSIS — I10 Essential (primary) hypertension: Secondary | ICD-10-CM | POA: Insufficient documentation

## 2015-10-20 DIAGNOSIS — R079 Chest pain, unspecified: Secondary | ICD-10-CM | POA: Diagnosis not present

## 2015-10-20 DIAGNOSIS — I4892 Unspecified atrial flutter: Secondary | ICD-10-CM | POA: Diagnosis not present

## 2015-10-20 DIAGNOSIS — I25119 Atherosclerotic heart disease of native coronary artery with unspecified angina pectoris: Secondary | ICD-10-CM | POA: Diagnosis not present

## 2015-10-20 LAB — MYOCARDIAL PERFUSION IMAGING
LV dias vol: 131 mL (ref 62–150)
LV sys vol: 68 mL
Peak HR: 75 {beats}/min
Rest HR: 62 {beats}/min
SDS: 1
SRS: 0
SSS: 1
TID: 0.95

## 2015-10-20 MED ORDER — TECHNETIUM TC 99M TETROFOSMIN IV KIT
10.1000 | PACK | Freq: Once | INTRAVENOUS | Status: DC | PRN
  Filled 2015-10-20: qty 10

## 2015-10-20 MED ORDER — TECHNETIUM TC 99M TETROFOSMIN IV KIT
30.4000 | PACK | Freq: Once | INTRAVENOUS | Status: DC | PRN
  Filled 2015-10-20: qty 30

## 2015-10-20 MED ORDER — AMINOPHYLLINE 25 MG/ML IV SOLN: 100.0000 mg | Freq: Once | INTRAVENOUS | Status: DC

## 2015-10-20 MED ORDER — REGADENOSON 0.4 MG/5ML IV SOLN: 0.4000 mg | Freq: Once | INTRAVENOUS | Status: DC

## 2015-10-20 NOTE — Telephone Encounter (Signed)
lmtcb

## 2015-10-20 NOTE — Telephone Encounter (Signed)
He was not compliant in the past He should have an old machine-we will have to see compliance download on that machine before writing a prescription for new CPAP

## 2015-10-20 NOTE — Telephone Encounter (Signed)
PA for eliquis complete and approved until 10/2018. PA #[id number].

## 2015-10-20 NOTE — Telephone Encounter (Signed)
Spoke with the pt's spouse  Pt seen by RA Feb 2016 and CPAP was ordered, but he was not eligible until Sept 2016 and would have had to pay out of pocket He never followed up here and has not been on CPAP, but wants [place] to go ahead and reorder this now  Please advise, thanks!

## 2015-10-21 ENCOUNTER — Encounter: Payer: Self-pay | Admitting: Adult Health

## 2015-10-21 ENCOUNTER — Ambulatory Visit (INDEPENDENT_AMBULATORY_CARE_PROVIDER_SITE_OTHER): Payer: Medicare Other | Admitting: Adult Health

## 2015-10-21 ENCOUNTER — Other Ambulatory Visit: Payer: Self-pay | Admitting: Family Medicine

## 2015-10-21 ENCOUNTER — Ambulatory Visit (INDEPENDENT_AMBULATORY_CARE_PROVIDER_SITE_OTHER)
Admission: RE | Admit: 2015-10-21 | Discharge: 2015-10-21 | Disposition: A | Discharge status: Home / Self Care | Payer: Medicare Other | Source: Ambulatory Visit | Attending: Adult Health | Admitting: Adult Health

## 2015-10-21 VITALS — BP 142/80 | HR 62 | Temp 98.2°F | Ht 74.0 in | Wt 240.0 lb

## 2015-10-21 DIAGNOSIS — Z760 Encounter for issue of repeat prescription: Secondary | ICD-10-CM | POA: Diagnosis not present

## 2015-10-21 DIAGNOSIS — E1140 Type 2 diabetes mellitus with diabetic neuropathy, unspecified: Secondary | ICD-10-CM

## 2015-10-21 DIAGNOSIS — R0602 Shortness of breath: Secondary | ICD-10-CM

## 2015-10-21 DIAGNOSIS — R05 Cough: Secondary | ICD-10-CM | POA: Diagnosis not present

## 2015-10-21 DIAGNOSIS — Z794 Long term (current) use of insulin: Secondary | ICD-10-CM

## 2015-10-21 DIAGNOSIS — I25119 Atherosclerotic heart disease of native coronary artery with unspecified angina pectoris: Secondary | ICD-10-CM | POA: Diagnosis not present

## 2015-10-21 DIAGNOSIS — E1165 Type 2 diabetes mellitus with hyperglycemia: Secondary | ICD-10-CM

## 2015-10-21 DIAGNOSIS — IMO0002 Reserved for concepts with insufficient information to code with codable children: Secondary | ICD-10-CM

## 2015-10-21 MED ORDER — METOPROLOL TARTRATE 25 MG PO TABS: 12.5000 mg | ORAL_TABLET | Freq: Two times a day (BID) | ORAL | 1 refills | Status: DC

## 2015-10-21 MED ORDER — METFORMIN HCL 1000 MG PO TABS: 1000.0000 mg | ORAL_TABLET | Freq: Two times a day (BID) | ORAL | 3 refills | Status: DC

## 2015-10-21 MED ORDER — GLIPIZIDE 10 MG PO TABS: 10.0000 mg | ORAL_TABLET | Freq: Two times a day (BID) | ORAL | 3 refills | Status: DC

## 2015-10-21 MED ORDER — NEBIVOLOL HCL 20 MG PO TABS: 20.0000 mg | ORAL_TABLET | Freq: Every day | ORAL | 1 refills | Status: DC

## 2015-10-21 MED ORDER — ATORVASTATIN CALCIUM 80 MG PO TABS: 80.0000 mg | ORAL_TABLET | Freq: Every day | ORAL | 1 refills | Status: DC

## 2015-10-21 NOTE — Telephone Encounter (Signed)
Called and spoke with pts wife and she stated that he does not have an old machine.  She stated that the pt was told that medicare would cover a new machine after September 2016.  She stated that she is not sure why this happened like that, but that is what they were told last year when RA tried to get the pt a new machine.    Pts wife stated that they want to use the [facility] in [place].  She stated that we can leave a detailed message on the cell number listed.  RA please advise. thanks

## 2015-10-21 NOTE — Telephone Encounter (Signed)
[name], patient's wife, returning call.  CB is [phone] or [phone].

## 2015-10-21 NOTE — Telephone Encounter (Signed)
lmtcb x2 for pt. 

## 2015-10-21 NOTE — Patient Instructions (Addendum)
Your A1c has come down to 7.0 from 9.7! GREAT JOB!  I will follow up with you regarding your chest x Cid    Continue to work on changing your diet. You have been given a second chance after the heart attack.   Follow up with me in one week

## 2015-10-21 NOTE — Telephone Encounter (Signed)
Ov with TP in [place] 10/27/15 at 12 noon  Nothing further needed per spouse  She is aware of office location

## 2015-10-21 NOTE — Telephone Encounter (Signed)
Please set up office visit-needs face-to-face so that we can order a new CPAP

## 2015-10-21 NOTE — Progress Notes (Addendum)
Subjective:    Patient ID: Luis Yu, male    DOB: 05-25-1945, 70 y.o.   MRN: [id number]  HPI  70 year old male who  has a past medical history of Adjustment disorder with mixed emotional features; CAD (coronary artery disease); Depression; Diabetes mellitus type II, uncontrolled (HCC); Diabetic peripheral neuropathy (HCC); Erectile dysfunction; GERD (gastroesophageal reflux disease); Herpes zoster; Hyperlipidemia; Hypertension; Obesity; Osteoarthritis; and Postherpetic neuralgia. Presents to the office today for follow up regarding diabetes.   I last saw him on 06/16/2015 at which time his A1c was 9.7. His current regimen is that of Glipizide 10mg  BID, Novolog 15-30 units TID, Toujeo 30 units and Metformin 1000mg .   Since I last saw him he was seen at [facility] in [facility] in 07/2015 and reports that he was ruled in for a MI with abnormal enzymes. He was treated medically. ( I do not have these records). In addition to this he went to an urgent care around the first week of July for dyspnea + wheezing. It was felt that he had bronchitis. An EKG was obtained which showed new onset atrial fibrillation with a CVR in the 60s. This was a new diagnosis for him.He was set up with cardiology consult and has since gone during that evaluation he continued to complain of recurrent chest pain that was described as substernal chest pressure he continued to have mild dyspnea that was relieved with sublingual nitroglycerin, EKG in the office showed atrial flutter with controlled ventricular response in the 60s. He is a follow-up appointment with Dr. Jens Som  Today in the office he reports that his blood sugars have been better controlled, he reports blood sugars between 120 and 200. He has tried improving his diet. Over the last few days he's become more dyspneic with exertion. He was noted that while walking down the hallway in the office today he became incredibly short of breath. One sitting down he is  able to catch his breath quickly. Denies any chest pain in the office today.     Review of Systems  Constitutional: Positive for activity change.  Respiratory: Positive for shortness of breath.   Cardiovascular: Negative.   Gastrointestinal: Negative.   Genitourinary: Negative.   Musculoskeletal: Positive for arthralgias.  Skin: Negative.   All other systems reviewed and are negative.  Past Medical History:  Diagnosis Date  . Adjustment disorder with mixed emotional features   . Atrial flutter (HCC)   . CAD (coronary artery disease)   . Depression   . Diabetes mellitus type II, uncontrolled (HCC)   . Diabetic peripheral neuropathy (HCC)   . Erectile dysfunction   . GERD (gastroesophageal reflux disease)   . Herpes zoster   . Hyperlipidemia   . Hypertension   . Obesity   . Osteoarthritis   . Postherpetic neuralgia     Social History   Social History  . Marital status: Married    Spouse name: [name]  . Number of children: 1  . Years of education: 56   Occupational History  . Retired Naval architect Retired   Social History Main Topics  . Smoking status: Never Smoker  . Smokeless tobacco: Never Used  . Alcohol use 1.2 oz/week    2 Standard drinks or equivalent per week     Comment: twice a month.  . Drug use: No  . Sexual activity: Yes    Partners: Female   Other Topics Concern  . Not on file   Social History  Narrative   Married [name])   Regular exercise: none   Caffeine use: cup of coffee daily    Retired Naval architect   Caffeine- twice daily             Past Surgical History:  Procedure Laterality Date  . CORONARY ARTERY BYPASS GRAFT  1996   eith a LIMA to the LAD, saphenous vein graft to the acute marginal, saphenous vein graft to the PDA and saphenous vein graft to the circumflex.  [name] KNEE ARTHROSCOPY  05/2004   right knee  . REPLACEMENT TOTAL KNEE BILATERAL    . SHOULDER SURGERY      Family History  Problem Relation Age of Onset  . Coronary  artery disease Mother     deceased at 71  . Coronary artery disease Father     alive  . Stroke      half brother  . Stroke Brother     No Known Allergies  Current Outpatient Prescriptions on File Prior to Visit  Medication Sig Dispense Refill  . apixaban (ELIQUIS) 5 MG TABS tablet Take 1 tablet (5 mg total) by mouth 2 (two) times daily. 60 tablet 6  . aspirin EC 81 MG tablet Take 1 tablet (81 mg total) by mouth daily.    [name] buPROPion (WELLBUTRIN XL) 300 MG 24 hr tablet Take 1 tablet (300 mg total) by mouth daily. 90 tablet 3  . Cyanocobalamin (B-12) 5000 MCG SUBL Place 1 tablet under the tongue once a week.    . fluticasone (FLONASE) 50 MCG/ACT nasal spray Place 2 sprays into both nostrils daily.    [name] gabapentin (NEURONTIN) 300 MG capsule TAKE ONE CAPSULE EVERY MORNING AND TAKE 2 CAPSULES EVERY EVENING 90 capsule 5  . glucose blood (ACCU-CHEK ACTIVE STRIPS) test strip Use as instructed 100 each 12  . insulin aspart (NOVOLOG) 100 UNIT/ML FlexPen Use 15 to 30 units 15 minutes before meals 3 times daily 5 pen 0  . Insulin Pen Needle (PEN NEEDLES 31GX5/16") 31G X 8 MM MISC Use to inject insulin 5 times a day. 100 each 11  . isosorbide mononitrate (IMDUR) 30 MG 24 hr tablet Take 1 tablet (30 mg total) by mouth daily.    . Lancets (ACCU-CHEK MULTICLIX) lancets Use up to 5 times a day to check blood sugar. 100 each 5  . lisinopril-hydrochlorothiazide (PRINZIDE,ZESTORETIC) 20-12.5 MG tablet TAKE 1 TABLET BY MOUTH EVERY DAY 90 tablet 1  . omeprazole (PRILOSEC) 20 MG capsule TAKE 1 CAPSULE (20 MG TOTAL) BY MOUTH 2 (TWO) TIMES DAILY BEFORE A MEAL. 180 capsule 0  . polyethylene glycol (MIRALAX / GLYCOLAX) packet Take 17 g by mouth daily. 17 grams mixed with water two times a day.     Nathen May SOLOSTAR 300 UNIT/ML SOPN INJECT 30 UNITS INTO THE SKIN AT BEDTIME. 4.5 mL 1   No current facility-administered medications on file prior to visit.     BP (!) 142/80   Pulse 62   Temp 98.2 F (36.8 C)  (Oral)   Ht  (1.88 m)   Wt 240 lb (108.9 kg)   BMI 30.81 kg/m       Objective:   Physical Exam  Constitutional: He is oriented to person, place, and time. He appears well-developed and well-nourished. No distress.  Cardiovascular: Normal rate, normal heart sounds and intact distal pulses.  An irregularly irregular rhythm present. Exam reveals no gallop and no friction rub.   No murmur heard. Pulmonary/Chest: Effort normal and breath  sounds normal. Tachypnea (with exertion ) noted. No respiratory distress. He has no wheezes. He has no rales. He exhibits no tenderness.  Neurological: He is alert and oriented to person, place, and time.  Skin: Skin is warm and dry. No rash noted. He is not diaphoretic. No erythema. No pallor.  Psychiatric: He has a normal mood and affect. His behavior is normal. Judgment and thought content normal.  Nursing note and vitals reviewed.     Assessment & Plan:  1. Shortness of breath - likely from a -fib - DG Chest 2 View; Future - Negative for pneumonia or CHF - EKG 12-Lead-  Atrial Fibrillation, rate 63. * possibly a flutter - Will increase by systolic from 10 mg 20 mg tabs. - Follow up in one week - Nebivolol HCl 20 MG TABS; Take 1 tablet (20 mg total) by mouth daily.  Dispense: 90 tablet; Refill: 1  2. Uncontrolled type 2 diabetes mellitus with diabetic neuropathy, with long-term current use of insulin (HCC) - POC A1c - 7.0. He has dropped from 9.7 - metFORMIN (GLUCOPHAGE) 1000 MG tablet; Take 1 tablet (1,000 mg total) by mouth 2 (two) times daily with a meal.  Dispense: 180 tablet; Refill: 3 - glipiZIDE (GLUCOTROL) 10 MG tablet; Take 1 tablet (10 mg total) by mouth 2 (two) times daily before a meal.  Dispense: 180 tablet; Refill: 3 - Continue to work on diet and exercise 3. Medication refill  - atorvastatin (LIPITOR) 80 MG tablet; Take 1 tablet (80 mg total) by mouth daily.  Dispense: 90 tablet; Refill: 1 - Nebivolol HCl 20 MG TABS; Take 1  tablet (20 mg total) by mouth daily.  Dispense: 90 tablet; Refill: 1 - metFORMIN (GLUCOPHAGE) 1000 MG tablet; Take 1 tablet (1,000 mg total) by mouth 2 (two) times daily with a meal.  Dispense: 180 tablet; Refill: 3 - glipiZIDE (GLUCOTROL) 10 MG tablet; Take 1 tablet (10 mg total) by mouth 2 (two) times daily before a meal.  Dispense: 180 tablet; Refill: 3  Shirline Frees, NP

## 2015-10-21 NOTE — Addendum Note (Signed)
Addended by: Nancy Fetter on: 10/21/2015 07:25 PM   Modules accepted: Orders

## 2015-10-24 ENCOUNTER — Telehealth: Payer: Self-pay | Admitting: Cardiology

## 2015-10-24 ENCOUNTER — Other Ambulatory Visit: Payer: Self-pay | Admitting: Cardiovascular Disease

## 2015-10-24 ENCOUNTER — Telehealth: Payer: Self-pay | Admitting: *Deleted

## 2015-10-24 MED ORDER — APIXABAN 5 MG PO TABS: 5.0000 mg | ORAL_TABLET | Freq: Two times a day (BID) | ORAL | 6 refills | Status: DC

## 2015-10-24 NOTE — Telephone Encounter (Signed)
[facility] [place] requesting a [facility] on Eliquis as she stated the pt asked for a ref[place] and the pharmacy there deleted the Rx.  I advised Tammy to contact Dr Clayborne DanaJonathan Berry's office as they gave the pt the last refill on 7/28 and she agreed.

## 2015-10-24 NOTE — Telephone Encounter (Signed)
[facility] ref[facility]

## 2015-10-24 NOTE — Telephone Encounter (Signed)
Returned call to [place]. She is concerned that patient needs to get an appt because they were supposed to follow up this week. Patient does not have an appt scheduled yet. Message sent to scheduling to schedule patient for an appt. Instructed her to call back tomorrow if no one contacts her.

## 2015-10-24 NOTE — Telephone Encounter (Signed)
°*  STAT* If patient is at the pharmacy, call can be transferred to refill team.   1. Which medications need to be refilled? (please list name of each medication and dose if known) New prescription for Eliquis 5 mg-[facility] [place] [facility] [place] [facility]but pharmacist deleted it by mistake2. Which pharmacy/location (including street and city if local pharmacy) [phone] 3. Do they need a 30 day or 90 day supply?Not sure of the amount pt have been getting

## 2015-10-24 NOTE — Telephone Encounter (Signed)
New message       Pt recently had stress test.  Wife is worried and want to talk to the nurse.  Pt is in AFIB and want to know if pt is going to have a cardioversion.  Please call

## 2015-10-25 ENCOUNTER — Telehealth: Payer: Self-pay | Admitting: Cardiology

## 2015-10-25 ENCOUNTER — Telehealth: Payer: Self-pay | Admitting: Internal Medicine

## 2015-10-25 NOTE — Telephone Encounter (Signed)
New Message  Pt call requesting to speak with RN. Pt states she was instructed to schedule an appt. Pt daughter asked for a sooner appt that what is avail. Please call back to discuss

## 2015-10-25 NOTE — Telephone Encounter (Addendum)
Wife states pt cannot get an appointment with Dr Jens Somrenshaw until sometime end of August. Wife was on hold with them, and it was never scheduled.. Pt had his stress test on Friday and instructed to see Dr Jens Somrenshaw (anybody) ASAP. Pt would like to know if there is another cardiologist that can see him,  Pt can not even walk to the bathroom due to breathing issues  Please advise.  Pt would like to know if we have any more samples of the . apixaban (ELIQUIS) 5 MG TABS tablet  and NOVOLOG FLEXPEN 100 UNIT/ML FlexPen  Pt will be in thurs for appointment  rx is over $300 and they are in the doughnut hole

## 2015-10-25 NOTE — Telephone Encounter (Signed)
That is the pt's wife that is calling. Her name is Luis Yu. Also, she was disconnected before an appointment could be made, and would like a call back asap to get on anybody's schedule.

## 2015-10-26 DIAGNOSIS — R404 Transient alteration of awareness: Secondary | ICD-10-CM | POA: Diagnosis not present

## 2015-10-26 DIAGNOSIS — E162 Hypoglycemia, unspecified: Secondary | ICD-10-CM | POA: Diagnosis not present

## 2015-10-26 NOTE — Telephone Encounter (Signed)
Patient notified verbalized understanding

## 2015-10-26 NOTE — Telephone Encounter (Signed)
Spoke with patient. Patient went into diabetic coma around 11:00 - EMS came out and gave patient glucagon and patient was stable and did not need to be transported. Patient's wife was notified by EMS that his blood sugar may continue to rise with the glucagon. Patient's wife states that patient's blood sugar is now 354. Spoke with Kandee KeenCory and advised to drink water and check again in about 2 hours. Patient verbalized understanding.

## 2015-10-26 NOTE — Telephone Encounter (Signed)
Please advise 

## 2015-10-26 NOTE — Telephone Encounter (Signed)
The best thing to do since she has already been referred there would to call back up there and ask if they can see another cardiologist instead. They need to make that appointment.   We should have samples of both

## 2015-10-26 NOTE — Telephone Encounter (Signed)
pts wife calling to let you know that the pt went into a diabetic coma and now she needs to know what to do since his sugar is high.  Refused [facility] rather speak with you and Kandee KeenCory about this.

## 2015-10-27 ENCOUNTER — Encounter: Payer: Self-pay | Admitting: Adult Health

## 2015-10-27 ENCOUNTER — Ambulatory Visit (INDEPENDENT_AMBULATORY_CARE_PROVIDER_SITE_OTHER): Payer: Medicare Other | Admitting: Adult Health

## 2015-10-27 ENCOUNTER — Telehealth: Payer: Self-pay | Admitting: Pulmonary Disease

## 2015-10-27 VITALS — BP 134/82 | HR 77 | Ht 70.0 in | Wt 239.0 lb

## 2015-10-27 VITALS — BP 124/62 | HR 76 | Temp 98.0°F | Ht 70.0 in | Wt 239.4 lb

## 2015-10-27 DIAGNOSIS — I4819 Other persistent atrial fibrillation: Secondary | ICD-10-CM

## 2015-10-27 DIAGNOSIS — R0602 Shortness of breath: Secondary | ICD-10-CM

## 2015-10-27 DIAGNOSIS — E1165 Type 2 diabetes mellitus with hyperglycemia: Secondary | ICD-10-CM | POA: Diagnosis not present

## 2015-10-27 DIAGNOSIS — I25119 Atherosclerotic heart disease of native coronary artery with unspecified angina pectoris: Secondary | ICD-10-CM | POA: Diagnosis not present

## 2015-10-27 DIAGNOSIS — E1140 Type 2 diabetes mellitus with diabetic neuropathy, unspecified: Secondary | ICD-10-CM

## 2015-10-27 DIAGNOSIS — I4891 Unspecified atrial fibrillation: Secondary | ICD-10-CM | POA: Insufficient documentation

## 2015-10-27 DIAGNOSIS — IMO0002 Reserved for concepts with insufficient information to code with codable children: Secondary | ICD-10-CM

## 2015-10-27 DIAGNOSIS — I481 Persistent atrial fibrillation: Secondary | ICD-10-CM | POA: Diagnosis not present

## 2015-10-27 DIAGNOSIS — G4733 Obstructive sleep apnea (adult) (pediatric): Secondary | ICD-10-CM

## 2015-10-27 DIAGNOSIS — Z794 Long term (current) use of insulin: Secondary | ICD-10-CM | POA: Diagnosis not present

## 2015-10-27 LAB — GLUCOSE, POCT (MANUAL RESULT ENTRY): POC Glucose: 107 mg/dl — AB (ref 70–99)

## 2015-10-27 NOTE — Telephone Encounter (Signed)
Spoke with [name] in office.  She said that the earliest patient could get machine would possibly be a week, but she will check into it and let me know.  TP advised.

## 2015-10-27 NOTE — Telephone Encounter (Signed)
Spoke with pt, Follow up scheduled with dr hochrein. Monday.

## 2015-10-27 NOTE — Patient Instructions (Signed)
Begin CPAP At bedtime  , wear for at least 4-6hr each night.  Do not drive if sleepy.  Follow up with Dr. Vassie LollAlva  In 6 weeks with download and As needed

## 2015-10-27 NOTE — Progress Notes (Signed)
Pre visit review using our clinic review tool, if applicable. No additional management support is needed unless otherwise documented below in the visit note. 

## 2015-10-27 NOTE — Progress Notes (Signed)
Subjective:    Patient ID: Luis Yu, male    DOB: [birth year], 70 y.o.   MRN: [id number]  HPI 70 yo male with OSA  Has DM, CAD s/p CABG  TEST  PSG 6/27 /11  - wt 246 lbs -showed severe obstructive sleep apnea with AHI 50/h & desaturation to 87%, corrected by CPAP 10 cm  echo 3/11 showed RVSP 25, nml LV funtion Download 10/12- 01/28/10 >> excellent compliance , some residual events  (AHI 21/h, centrals 5/h ) on 10 cm, will increase to 12 cm & rechk, leak ++ on download, download on 12 cm shows leak ++, residual AHI 21/h,centrals 5/h,  good usage Downalod on 12 cm 3/6-06/20/10 shows increased centrals 11/h, with AHI 28/h, hypopneas 11/h   10/27/2015 Follow up : OSA  Pt returns for follow up for sleep apnea .  Pt has severe OSA and was on CPAP previously . Worse on/off then returned to office last year to restart CPAP as he had been off for few years. He was ordered a new machine but never went and picked it up.  He returns today with wife and wants to restart on CPAP .  Discussed with his DME that a new order will need to be sent.  He has recently been diagnosed with A Fib and wants to get on CPAP now.  He has daytime sleepiness, and severe snoring.    Says he has been having trouble with low sugars , EMS was called to his home and required D50 .  Sugars have been better today . He is seeing his PCP later today .       Past Medical History:  Diagnosis Date  . Adjustment disorder with mixed emotional features   . Atrial flutter (HCC)   . CAD (coronary artery disease)   . Depression   . Diabetes mellitus type II, uncontrolled (HCC)   . Diabetic peripheral neuropathy (HCC)   . Erectile dysfunction   . GERD (gastroesophageal reflux disease)   . Herpes zoster   . Hyperlipidemia   . Hypertension   . Obesity   . Osteoarthritis   . Postherpetic neuralgia    Current Outpatient Prescriptions on File Prior to Visit  Medication Sig Dispense Refill  . apixaban (ELIQUIS) 5 MG  TABS tablet Take 1 tablet (5 mg total) by mouth 2 (two) times daily. 60 tablet 6  . aspirin EC 81 MG tablet Take 1 tablet (81 mg total) by mouth daily.    [name] atorvastatin (LIPITOR) 80 MG tablet Take 1 tablet (80 mg total) by mouth daily. 90 tablet 1  . buPROPion (WELLBUTRIN XL) 300 MG 24 hr tablet Take 1 tablet (300 mg total) by mouth daily. 90 tablet 3  . Cyanocobalamin (B-12) 5000 MCG SUBL Place 1 tablet under the tongue once a week.    . fluticasone (FLONASE) 50 MCG/ACT nasal spray Place 2 sprays into both nostrils daily.    [name] gabapentin (NEURONTIN) 300 MG capsule TAKE ONE CAPSULE EVERY MORNING AND TAKE 2 CAPSULES EVERY EVENING 90 capsule 5  . glipiZIDE (GLUCOTROL) 10 MG tablet Take 1 tablet (10 mg total) by mouth 2 (two) times daily before a meal. 180 tablet 3  . glucose blood (ACCU-CHEK ACTIVE STRIPS) test strip Use as instructed 100 each 12  . Insulin Pen Needle (PEN NEEDLES 31GX5/16") 31G X 8 MM MISC Use to inject insulin 5 times a day. 100 each 11  . isosorbide mononitrate (IMDUR) 30 MG 24 hr tablet Take  1 tablet (30 mg total) by mouth daily.    . Lancets (ACCU-CHEK MULTICLIX) lancets Use up to 5 times a day to check blood sugar. 100 each 5  . lisinopril-hydrochlorothiazide (PRINZIDE,ZESTORETIC) 20-12.5 MG tablet TAKE 1 TABLET BY MOUTH EVERY DAY 90 tablet 1  . metFORMIN (GLUCOPHAGE) 1000 MG tablet Take 1 tablet (1,000 mg total) by mouth 2 (two) times daily with a meal. 180 tablet 3  . Nebivolol HCl 20 MG TABS Take 1 tablet (20 mg total) by mouth daily. 90 tablet 1  . NOVOLOG FLEXPEN 100 UNIT/ML FlexPen USE 15 TO 30 UNITS 15 MINUTES BEFORE MEALS 3 TIMES DAILY 5 pen 0  . omeprazole (PRILOSEC) 20 MG capsule TAKE 1 CAPSULE (20 MG TOTAL) BY MOUTH 2 (TWO) TIMES DAILY BEFORE A MEAL. 180 capsule 0  . polyethylene glycol (MIRALAX / GLYCOLAX) packet Take 17 g by mouth daily. 17 grams mixed with water two times a day.     Nathen May. TOUJEO SOLOSTAR 300 UNIT/ML SOPN INJECT 30 UNITS INTO THE SKIN AT BEDTIME. 4.5  mL 1   No current facility-administered medications on file prior to visit.     Review of Systems Constitutional:   No  weight loss, night sweats,  Fevers, chills,  +fatigue, or  lassitude.  HEENT:   No headaches,  Difficulty swallowing,  Tooth/dental problems, or  Sore throat,                No sneezing, itching, ear ache, nasal congestion, post nasal drip,   CV:  No chest pain,  Orthopnea, PND, swelling in lower extremities, anasarca, dizziness, palpitations, syncope.   GI  No heartburn, indigestion, abdominal pain, nausea, vomiting, diarrhea, change in bowel habits, loss of appetite, bloody stools.   Resp:   No excess mucus, no productive cough,  No non-productive cough,  No coughing up of blood.  No change in color of mucus.  No wheezing.  No chest wall deformity  Skin: no rash or lesions.  GU: no dysuria, change in color of urine, no urgency or frequency.  No flank pain, no hematuria   MS:  No joint pain or swelling.  No decreased range of motion.  No back pain.  Psych:  No change in mood or affect. No depression or anxiety.  + memory loss.         Objective:   Physical Exam Vitals:   10/27/15 1159  Pulse: 77  SpO2: 95%  Weight: 239 lb (108.4 kg)  Height: 5\' 10"  (1.778 m)   GEN: A/Ox3; pleasant , NAD, elderly    HEENT:  [place]/AT,  , NOSE-clear, THROAT-clear, no lesions, no postnasal drip or exudate noted. HOH -hearing aides   NECK:  Supple w/ fair ROM; no JVD; normal carotid impulses w/o bruits; no thyromegaly or nodules palpated; no lymphadenopathy.    RESP  Clear  P & A; w/o, wheezes/ rales/ or rhonchi. no accessory muscle use, no dullness to percussion  CARD:  irreg  no m/r/g  , tr to none peripheral edema, pulses intact, no cyanosis or clubbing.  GI:   Soft & nt; nml bowel sounds; no organomegaly or masses detected.   Musco: Warm bil, no deformities or joint swelling noted.   Neuro: alert, gait imbalance   Skin: Warm, no lesions or rashes  Fitzpatrick Alberico  NP-C  [facility]  10/27/2015        Assessment & Plan:

## 2015-10-27 NOTE — Assessment & Plan Note (Signed)
Severe OSA -will restart CPAP At bedtime   Now with A Fib   Plan  Patient Instructions  Begin CPAP At bedtime  , wear for at least 4-6hr each night.  Do not drive if sleepy.  Follow up with Dr. Vassie LollAlva  In 6 weeks with download and As needed

## 2015-10-27 NOTE — Progress Notes (Signed)
Subjective:    Patient ID: Luis Yu, male    DOB: Jul 21, 1945, 70 y.o.   MRN: [id number]  HPI  70 year old male who presents to the office today for one week follow up after being diagnosed with a fib at OSH. At the last office visit Roe CoombsDon was extremely short of breath with exertion. EKG was done that showed a fib with possible a flutter. He had a chest x [place] which was negative for PNA of CHF.   I had him increase his Bystolic from 5 mg to 10 mg. He reports today that his breathing has improved since the increase.   Unfortunantly, this week EMS had to be called to the house for a blood sugar of 35. His wife who accompanied him to this visit reports that Roe CoombsDon may have given himself too much novolog. She believes that he accidentally gave himself 50 units for a blood suagr reading of 299. There is also concern that he did not take the long acting insulin prior to going to bed the night before.   Today in the office he reports that he is feeling much better over all.   He is scheduled to see Cardiology at the beginning of next week. I asked them to talk to cardiology about a less expensive beta blocker due to Bystolic costing them hundreds of dollars.     Review of Systems  Constitutional: Negative.   Respiratory: Negative.   Cardiovascular: Negative.   Gastrointestinal: Negative.   Genitourinary: Negative.   Musculoskeletal: Negative.   Neurological: Negative.   All other systems reviewed and are negative.  Past Medical History:  Diagnosis Date  . Adjustment disorder with mixed emotional features   . Atrial flutter (HCC)   . CAD (coronary artery disease)   . Depression   . Diabetes mellitus type II, uncontrolled (HCC)   . Diabetic peripheral neuropathy (HCC)   . Erectile dysfunction   . GERD (gastroesophageal reflux disease)   . Herpes zoster   . Hyperlipidemia   . Hypertension   . Obesity   . Osteoarthritis   . Postherpetic neuralgia     Social History   Social  History  . Marital status: Married    Spouse name: [name]  . Number of children: 1  . Years of education: 7513   Occupational History  . Retired Naval architecttruck driver Retired   Social History Main Topics  . Smoking status: Never Smoker  . Smokeless tobacco: Never Used  . Alcohol use 1.2 oz/week    2 Standard drinks or equivalent per week     Comment: twice a month.  . Drug use: No  . Sexual activity: Yes    Partners: Female   Other Topics Concern  . Not on file   Social History Narrative   Married [name]([name])   Regular exercise: none   Caffeine use: cup of coffee daily    Retired Naval architecttruck driver   Caffeine- twice daily             Past Surgical History:  Procedure Laterality Date  . CORONARY ARTERY BYPASS GRAFT  1996   eith a LIMA to the LAD, saphenous vein graft to the acute marginal, saphenous vein graft to the PDA and saphenous vein graft to the circumflex.  [name]. KNEE ARTHROSCOPY  05/2004   right knee  . REPLACEMENT TOTAL KNEE BILATERAL    . SHOULDER SURGERY      Family History  Problem Relation Age of Onset  .  Coronary artery disease Mother     deceased at 49  . Coronary artery disease Father     alive  . Stroke      half brother  . Stroke Brother     No Known Allergies  Current Outpatient Prescriptions on File Prior to Visit  Medication Sig Dispense Refill  . apixaban (ELIQUIS) 5 MG TABS tablet Take 1 tablet (5 mg total) by mouth 2 (two) times daily. 60 tablet 6  . aspirin EC 81 MG tablet Take 1 tablet (81 mg total) by mouth daily.    [name] atorvastatin (LIPITOR) 80 MG tablet Take 1 tablet (80 mg total) by mouth daily. 90 tablet 1  . buPROPion (WELLBUTRIN XL) 300 MG 24 hr tablet Take 1 tablet (300 mg total) by mouth daily. 90 tablet 3  . Cyanocobalamin (B-12) 5000 MCG SUBL Place 1 tablet under the tongue once a week.    . fluticasone (FLONASE) 50 MCG/ACT nasal spray Place 2 sprays into both nostrils daily.    [name] gabapentin (NEURONTIN) 300 MG capsule TAKE ONE CAPSULE EVERY  MORNING AND TAKE 2 CAPSULES EVERY EVENING 90 capsule 5  . glipiZIDE (GLUCOTROL) 10 MG tablet Take 1 tablet (10 mg total) by mouth 2 (two) times daily before a meal. 180 tablet 3  . glucose blood (ACCU-CHEK ACTIVE STRIPS) test strip Use as instructed 100 each 12  . Insulin Pen Needle (PEN NEEDLES 31GX5/16") 31G X 8 MM MISC Use to inject insulin 5 times a day. 100 each 11  . isosorbide mononitrate (IMDUR) 30 MG 24 hr tablet Take 1 tablet (30 mg total) by mouth daily.    . Lancets (ACCU-CHEK MULTICLIX) lancets Use up to 5 times a day to check blood sugar. 100 each 5  . lisinopril-hydrochlorothiazide (PRINZIDE,ZESTORETIC) 20-12.5 MG tablet TAKE 1 TABLET BY MOUTH EVERY DAY 90 tablet 1  . metFORMIN (GLUCOPHAGE) 1000 MG tablet Take 1 tablet (1,000 mg total) by mouth 2 (two) times daily with a meal. 180 tablet 3  . Nebivolol HCl 20 MG TABS Take 1 tablet (20 mg total) by mouth daily. 90 tablet 1  . NOVOLOG FLEXPEN 100 UNIT/ML FlexPen USE 15 TO 30 UNITS 15 MINUTES BEFORE MEALS 3 TIMES DAILY 5 pen 0  . omeprazole (PRILOSEC) 20 MG capsule TAKE 1 CAPSULE (20 MG TOTAL) BY MOUTH 2 (TWO) TIMES DAILY BEFORE A MEAL. 180 capsule 0  . polyethylene glycol (MIRALAX / GLYCOLAX) packet Take 17 g by mouth daily. 17 grams mixed with water two times a day.     Nathen May SOLOSTAR 300 UNIT/ML SOPN INJECT 30 UNITS INTO THE SKIN AT BEDTIME. 4.5 mL 1   No current facility-administered medications on file prior to visit.     BP 124/62 (BP Location: Left Arm, Patient Position: Sitting, Cuff Size: Normal)   Pulse 76   Temp 98 F (36.7 C) (Oral)   Ht 5\' 10"  (1.778 m)   Wt 239 lb 6.4 oz (108.6 kg)   SpO2 97%   BMI 34.35 kg/m       Objective:   Physical Exam  Constitutional: He is oriented to person, place, and time. He appears well-developed and well-nourished. No distress.  HENT:  Head: Normocephalic and atraumatic.  Right Ear: External ear normal.  Left Ear: External ear normal.  Nose: Nose normal.  Mouth/Throat:  Oropharynx is clear and moist. No oropharyngeal exudate.  Cardiovascular: Normal rate, normal heart sounds and intact distal pulses.  An irregularly irregular rhythm present. Exam reveals no gallop  and no friction rub.   No murmur heard. Pulmonary/Chest: Effort normal and breath sounds normal. No respiratory distress. He has no wheezes. He has no rales. He exhibits no tenderness.  Neurological: He is alert and oriented to person, place, and time.  Skin: Skin is warm and dry. No rash noted. He is not diaphoretic. No erythema. No pallor.  Psychiatric: He has a normal mood and affect. His behavior is normal. Judgment and thought content normal.  Nursing note reviewed.     Assessment & Plan:  1. Persistent atrial fibrillation (HCC) - In a fib today -  Increase in Bystolic seems to have helped his Atrial Fibrillation  - Consider switching to Lopressor - Sample of  Eliquis given   2. Uncontrolled type 2 diabetes mellitus with diabetic neuropathy, with long-term current use of insulin (HCC) - POC Glucose (CBG) - Take only recommended dose of insulin 3. Shortness of breath - Has decreased tremendously since increasing BB.  - Continue with current dose until seen by Cardiology    Shirline Frees, NP

## 2015-10-30 NOTE — Progress Notes (Signed)
Cardiology Office Note   Date:  10/31/2015   ID:  Luis Yu, DOB 02-19-1946, MRN [id number]  PCP:  Thomos Lemons, DO  Cardiologist:   Rollene Rotunda, MD   Chief Complaint  Patient presents with  . Shortness of Breath      History of Present Illness: Luis Yu is a 70 y.o. male who presents for evaluation of atrial fib.   He is seen for Dr. Jens Som.  He has a history of CABG x 3 years ago.  Cath in [place] of 2011 revealed triple-vessel coronary artery disease status post three-vessel CABG with 3/3 patent grafts. He had severe stenosis in the proximal and distal body of the saphenous vein graft to the left-sided PDA. There was normal left ventricular systolic function. The patient had PCI with a drug-eluting stent at that time. He had repeat cardiac cath in Dec of 2011 for dyspnea and f/u abnormal myoview. This revealed an EF of 50%, continued patency of internal mammary to the LAD, occluded saphenous vein graft  to the distal circumflex/PDA territory and progressive disease in the saphenous vein graft to the OM. The patient was evaluated by CVTS. Distal vessels were felt to be poor targets. It was decided that CABG could be considered in the future if PCI options were exhausted. The patient had PCI of the saphenous vein graft to the obtuse marginal. He was last seen by Dene Gentry in late July after being admitted to [facility] in [place].  He was told that he had a heart attack but was managed medically.  (We still do not have these records).  He was in urgent care after this for SOB EKG showed new atrial fib.  EKG on last appt demonstrated atrial flutter.   At that appt the Plavix was stopped and Eliquis was started.  A stress test was recommended.   The patient had no ischemia.    He returns for follow-up with continued dyspnea. He short of breath walking 20 yards on level ground. He has been seen by primary care center chest x-[place] which demonstrated no acute disease. Labs  demonstrated normal TSH and CBC recently. His creatinine is mildly increased. He's not describing any PND or orthopnea. He's not having any significant palpitations, presyncope or syncope. He's not having any chest pain. Is much more fatigued than he was prior to the diagnosis of fibrillation earlier this year.  Of note he had a severe hypoglycemic episode last weekend EMS was called in his house.   Past Medical History:  Diagnosis Date  . Adjustment disorder with mixed emotional features   . Atrial flutter (HCC)   . CAD (coronary artery disease)   . Depression   . Diabetes mellitus type II, uncontrolled (HCC)   . Diabetic peripheral neuropathy (HCC)   . Erectile dysfunction   . GERD (gastroesophageal reflux disease)   . Herpes zoster   . Hyperlipidemia   . Hypertension   . Obesity   . Osteoarthritis   . Postherpetic neuralgia   . Sleep apnea     Past Surgical History:  Procedure Laterality Date  . CORONARY ARTERY BYPASS GRAFT  1996   eith a LIMA to the LAD, saphenous vein graft to the acute marginal, saphenous vein graft to the PDA and saphenous vein graft to the circumflex.  [name] KNEE ARTHROSCOPY  05/2004   right knee  . REPLACEMENT TOTAL KNEE BILATERAL    . SHOULDER SURGERY       Current  Outpatient Prescriptions  Medication Sig Dispense Refill  . apixaban (ELIQUIS) 5 MG TABS tablet Take 1 tablet (5 mg total) by mouth 2 (two) times daily. 60 tablet 6  . aspirin EC 81 MG tablet Take 1 tablet (81 mg total) by mouth daily.    [name]. atorvastatin (LIPITOR) 80 MG tablet Take 1 tablet (80 mg total) by mouth daily. 90 tablet 1  . buPROPion (WELLBUTRIN XL) 300 MG 24 hr tablet Take 1 tablet (300 mg total) by mouth daily. 90 tablet 3  . Cyanocobalamin (B-12) 5000 MCG SUBL Place 1 tablet under the tongue once a week.    . fluticasone (FLONASE) 50 MCG/ACT nasal spray Place 2 sprays into both nostrils daily.    [name]. gabapentin (NEURONTIN) 300 MG capsule TAKE ONE CAPSULE EVERY MORNING AND TAKE 2  CAPSULES EVERY EVENING 90 capsule 5  . glipiZIDE (GLUCOTROL) 10 MG tablet Take 1 tablet (10 mg total) by mouth 2 (two) times daily before a meal. 180 tablet 3  . glucose blood (ACCU-CHEK ACTIVE STRIPS) test strip Use as instructed 100 each 12  . Insulin Pen Needle (PEN NEEDLES 31GX5/16") 31G X 8 MM MISC Use to inject insulin 5 times a day. 100 each 11  . isosorbide mononitrate (IMDUR) 30 MG 24 hr tablet Take 1 tablet (30 mg total) by mouth daily. 30 tablet 12  . Lancets (ACCU-CHEK MULTICLIX) lancets Use up to 5 times a day to check blood sugar. 100 each 5  . lisinopril-hydrochlorothiazide (PRINZIDE,ZESTORETIC) 20-12.5 MG tablet TAKE 1 TABLET BY MOUTH EVERY DAY 90 tablet 1  . metFORMIN (GLUCOPHAGE) 1000 MG tablet Take 1 tablet (1,000 mg total) by mouth 2 (two) times daily with a meal. 180 tablet 3  . Nebivolol HCl 20 MG TABS Take 1 tablet (20 mg total) by mouth daily. 90 tablet 1  . NOVOLOG FLEXPEN 100 UNIT/ML FlexPen USE 15 TO 30 UNITS 15 MINUTES BEFORE MEALS 3 TIMES DAILY 5 pen 0  . omeprazole (PRILOSEC) 20 MG capsule TAKE 1 CAPSULE (20 MG TOTAL) BY MOUTH 2 (TWO) TIMES DAILY BEFORE A MEAL. 180 capsule 0  . polyethylene glycol (MIRALAX / GLYCOLAX) packet Take 17 g by mouth daily. 17 grams mixed with water two times a day.     Luis Yu. TOUJEO SOLOSTAR 300 UNIT/ML SOPN INJECT 30 UNITS INTO THE SKIN AT BEDTIME. 4.5 mL 1   No current facility-administered medications for this visit.     Allergies:   Review of patient's allergies indicates no known allergies.    Social History:  The patient  reports that he has never smoked. He has never used smokeless tobacco. He reports that he drinks about 1.2 oz of alcohol per week . He reports that he does not use drugs.   Family History:  The patient's family history includes Coronary artery disease in his father and mother; Stroke in his brother.   ROS:  Please see the history of present illness.   Otherwise, review of systems are positive for none .   All other  systems are reviewed and negative.    PHYSICAL EXAM: VS:  BP (!) 160/82 (BP Location: Right Arm, Patient Position: Sitting, Cuff Size: Normal)   Pulse 60   Ht 6\' 2"  (1.88 m)   Wt 237 lb 4 oz (107.6 kg)   SpO2 96%   BMI 30.46 kg/m  , BMI Body mass index is 30.46 kg/m. GENERAL:  Frail appearing HEENT:  Pupils equal round and reactive, fundi not visualized, oral mucosa unremarkable NECK:  No jugular venous distention, waveform within normal limits, carotid upstroke brisk and symmetric, no bruits, no thyromegaly LYMPHATICS:  No cervical, inguinal adenopathy LUNGS:  Clear to auscultation bilaterally BACK:  No CVA tenderness CHEST:  Well healed sternotomy scar. HEART:  PMI not displaced or sustained,S1 and S2 within normal limits, no S3, no clicks, no rubs, no murmurs, irregular ABD:  Flat, positive bowel sounds normal in frequency in pitch, no bruits, no rebound, no guarding, no midline pulsatile mass, no hepatomegaly, no splenomegaly EXT:  2 plus pulses throughout, no edema, no cyanosis no clubbing SKIN:  No rashes no nodules NEURO:  Cranial nerves II through XII grossly intact, motor grossly intact throughout PSYCH:  Cognitively intact, oriented to person place and time    EKG:  EKG is not ordered today.    Recent Labs: 05/13/2015: ALT 16 10/14/2015: BUN 27; Creat 1.23; Hemoglobin 12.9; Platelets 142; Potassium 5.2; Sodium 144; TSH 1.15    Lipid Panel    Component Value Date/Time   CHOL 142 05/13/2015 1708   TRIG 77 05/13/2015 1708   HDL 54 05/13/2015 1708   CHOLHDL 2.6 05/13/2015 1708   VLDL 15 05/13/2015 1708   LDLCALC 73 05/13/2015 1708      Wt Readings from Last 3 Encounters:  10/31/15 237 lb 4 oz (107.6 kg)  10/27/15 239 lb 6.4 oz (108.6 kg)  10/27/15 239 lb (108.4 kg)      Other studies Reviewed: Additional studies/ records that were reviewed today include:   Lexiscan Myoview.. Review of the above records demonstrates:  Please see elsewhere in the note.      ASSESSMENT AND PLAN:   CAD:    He had a negative stress perfusion study. He will continue with medical management.  DYSPNEA:  I suspect that this is related to his fibrillation. And check a BNP. I'm going to repeat an echocardiogram although [place]. He clearly seems to be progressively more dyspneic since then.  PAF:   Mr. Luis Yu has a CHA2DS2 - VASc score of 4 with a risk of stroke of 4%.  discovered 3 weeks ago by EKG at urgent care. I'm going to continue his anticoagulation. He's not been able to long-term after he's been cardioverted might need warfarin.  I am going to planan cardioversion but first we'll check a Holter monitor to make sure he has persistent atrial fibrillation.  For now review of the recent EKGs demonstrates good rate control.  He will otherwise remain on the meds as listed.  SLEEP APNEA:  This has been diagnosed and he is due to get CPAP.   Current medicines are reviewed at length with the patient today.  The patient does not have concerns regarding medicines.  The following changes have been made:  no change  Labs/ tests ordered today include:   Orders Placed This Encounter  Procedures  . Basic metabolic panel  . Holter monitor - 24 hour  . ECHOCARDIOGRAM COMPLETE     Disposition:   FU with Dr. Jens Somrenshaw and with DCCV.     Signed, Rollene RotundaJames Chandler Swiderski, MD  10/31/2015 12:31 PM    [facility]

## 2015-10-31 ENCOUNTER — Encounter: Payer: Self-pay | Admitting: Cardiology

## 2015-10-31 ENCOUNTER — Ambulatory Visit (INDEPENDENT_AMBULATORY_CARE_PROVIDER_SITE_OTHER): Payer: Medicare Other | Admitting: Cardiology

## 2015-10-31 ENCOUNTER — Encounter: Payer: Self-pay | Admitting: *Deleted

## 2015-10-31 VITALS — BP 160/82 | HR 60 | Ht 74.0 in | Wt 237.2 lb

## 2015-10-31 DIAGNOSIS — I4891 Unspecified atrial fibrillation: Secondary | ICD-10-CM

## 2015-10-31 DIAGNOSIS — I25119 Atherosclerotic heart disease of native coronary artery with unspecified angina pectoris: Secondary | ICD-10-CM

## 2015-10-31 DIAGNOSIS — R0600 Dyspnea, unspecified: Secondary | ICD-10-CM | POA: Diagnosis not present

## 2015-10-31 LAB — BASIC METABOLIC PANEL
BUN: 20 mg/dL (ref 7–25)
CO2: 27 mmol/L (ref 20–31)
Calcium: 9.3 mg/dL (ref 8.6–10.3)
Chloride: 106 mmol/L (ref 98–110)
Creat: 1.21 mg/dL — ABNORMAL HIGH (ref 0.70–1.18)
Glucose, Bld: 58 mg/dL — ABNORMAL LOW (ref 65–99)
Potassium: 4.3 mmol/L (ref 3.5–5.3)
Sodium: 143 mmol/L (ref 135–146)

## 2015-10-31 MED ORDER — ISOSORBIDE MONONITRATE ER 30 MG PO TB24: 30.0000 mg | ORAL_TABLET | Freq: Every day | ORAL | 12 refills | Status: DC

## 2015-10-31 NOTE — Telephone Encounter (Signed)
[facility].  Awaiting response.

## 2015-10-31 NOTE — Patient Instructions (Signed)
Medication Instructions:   NO CHANGE  Labwork:  Your physician recommends that you HAVE LAB WORK TODAY  Testing/Procedures:  Your physician has recommended that you wear a 24 HOUR holter monitor. Holter monitors are medical devices that record the heart's electrical activity. Doctors most often use these monitors to diagnose arrhythmias. Arrhythmias are problems with the speed or rhythm of the heartbeat. The monitor is a small, portable device. You can wear one while you do your normal daily activities. This is usually used to diagnose what is causing palpitations/syncope (passing out).    Your physician has requested that you have an echocardiogram. Echocardiography is a painless test that uses sound waves to create images of your heart. It provides your doctor with information about the size and shape of your heart and how well your heart's chambers and valves are working. This procedure takes approximately one hour. There are no restrictions for this procedure.    Your physician has recommended that you have a Cardioversion (DCCV). Electrical Cardioversion uses a jolt of electricity to your heart either through paddles or wired patches attached to your chest. This is a controlled, usually prescheduled, procedure. Defibrillation is done under light anesthesia in the hospital, and you usually go home the day of the procedure. This is done to get your heart back into a normal rhythm. You are not awake for the procedure. Please see the instruction sheet given to you today.    Follow-Up:  Your physician recommends that you schedule a follow-up appointment in: 2 WEEKS WITH APP BERGE OR MENG

## 2015-11-01 ENCOUNTER — Telehealth: Payer: Self-pay | Admitting: Cardiology

## 2015-11-01 ENCOUNTER — Ambulatory Visit (HOSPITAL_COMMUNITY): Payer: Medicare Other | Attending: Cardiology

## 2015-11-01 ENCOUNTER — Telehealth: Payer: Self-pay | Admitting: *Deleted

## 2015-11-01 ENCOUNTER — Other Ambulatory Visit: Payer: Self-pay

## 2015-11-01 ENCOUNTER — Other Ambulatory Visit (INDEPENDENT_AMBULATORY_CARE_PROVIDER_SITE_OTHER): Payer: Medicare Other

## 2015-11-01 DIAGNOSIS — R0602 Shortness of breath: Secondary | ICD-10-CM | POA: Diagnosis not present

## 2015-11-01 DIAGNOSIS — E785 Hyperlipidemia, unspecified: Secondary | ICD-10-CM | POA: Insufficient documentation

## 2015-11-01 DIAGNOSIS — E119 Type 2 diabetes mellitus without complications: Secondary | ICD-10-CM | POA: Diagnosis not present

## 2015-11-01 DIAGNOSIS — I2510 Atherosclerotic heart disease of native coronary artery without angina pectoris: Secondary | ICD-10-CM | POA: Insufficient documentation

## 2015-11-01 DIAGNOSIS — Z951 Presence of aortocoronary bypass graft: Secondary | ICD-10-CM | POA: Diagnosis not present

## 2015-11-01 DIAGNOSIS — I119 Hypertensive heart disease without heart failure: Secondary | ICD-10-CM | POA: Diagnosis not present

## 2015-11-01 DIAGNOSIS — I4891 Unspecified atrial fibrillation: Secondary | ICD-10-CM | POA: Diagnosis not present

## 2015-11-01 NOTE — Telephone Encounter (Signed)
Received [facility] as requested by Dr Antoine PocheHochrein.  Records given to Dr Jens Somrenshaw to review.

## 2015-11-01 NOTE — Telephone Encounter (Signed)
Spoke with [name].  She is running the order through and will get machine to patient as soon as she can.  Will let us know if there is any problems with the order. Nothing further needed.

## 2015-11-01 NOTE — Telephone Encounter (Signed)
Left message for patient to call back  

## 2015-11-01 NOTE — Telephone Encounter (Signed)
-----   Message from Rollene RotundaJames Hochrein, MD sent at 11/01/2015 11:50 AM EDT ----- Labs OK except glucose was low at that time.  No change in therapy.  Call Mr. Rosalia HammersRay with the results and send results to Thomos Lemonsobert Yoo, DO

## 2015-11-01 NOTE — Telephone Encounter (Signed)
Luis Yu  Karleen HampshireMichelle P Carmela Piechowski, CMA        We reviewed this pt's documentation and because he has Medicare and there has been a documented break in therapy, he will need to start over and have a new sleep study done. A home sleep study will be fine.  Let me know if you have any questions.  Thanks    -------------- Per Efraim KaufmannMelissa, patient will need a sleep study since he had a break in therapy.  She said that a HST would be fine.   TP, please advise.

## 2015-11-01 NOTE — Telephone Encounter (Signed)
Please let pt know , set up for home sleep study .

## 2015-11-01 NOTE — Telephone Encounter (Signed)
[name] returned [name]'s call and needs to speak with her.  CB is [phone].

## 2015-11-01 NOTE — Telephone Encounter (Signed)
Spoke with pt, dr hochrein also wanted a bnp checked. [facility].

## 2015-11-02 ENCOUNTER — Telehealth: Payer: Self-pay | Admitting: Pulmonary Disease

## 2015-11-02 ENCOUNTER — Encounter: Payer: Self-pay | Admitting: Podiatry

## 2015-11-02 ENCOUNTER — Ambulatory Visit (INDEPENDENT_AMBULATORY_CARE_PROVIDER_SITE_OTHER): Payer: Medicare Other | Admitting: Podiatry

## 2015-11-02 ENCOUNTER — Telehealth: Payer: Self-pay | Admitting: Cardiology

## 2015-11-02 DIAGNOSIS — E1140 Type 2 diabetes mellitus with diabetic neuropathy, unspecified: Secondary | ICD-10-CM | POA: Diagnosis not present

## 2015-11-02 DIAGNOSIS — M79676 Pain in unspecified toe(s): Secondary | ICD-10-CM | POA: Diagnosis not present

## 2015-11-02 DIAGNOSIS — G4733 Obstructive sleep apnea (adult) (pediatric): Secondary | ICD-10-CM | POA: Diagnosis not present

## 2015-11-02 DIAGNOSIS — B351 Tinea unguium: Secondary | ICD-10-CM | POA: Diagnosis not present

## 2015-11-02 DIAGNOSIS — Z794 Long term (current) use of insulin: Secondary | ICD-10-CM

## 2015-11-02 DIAGNOSIS — M79609 Pain in unspecified limb: Principal | ICD-10-CM

## 2015-11-02 LAB — BRAIN NATRIURETIC PEPTIDE: Brain Natriuretic Peptide: 195.2 pg/mL — ABNORMAL HIGH (ref <100)

## 2015-11-02 NOTE — Progress Notes (Addendum)
Patient ID: Luis ImusDonald W Ogren, male   DOB: Sep 21, 1945, 70 y.o.   MRN: [id number] Complaint:  Visit Type: Patient returns to my office for continued preventative foot care services. Complaint: Patient states" my nails have grown long and thick and become painful to walk and wear shoes" Patient has been diagnosed with DM with neuropathy. The patient presents for preventative foot care services. No changes to ROS. Patient has been sick and under treatment for atrial fib.  Podiatric Exam: Vascular: dorsalis pedis and posterior tibial pulses are palpable bilateral. Capillary return is immediate. Temperature gradient is WNL. Skin turgor WNL  Sensorium: Normal Semmes Weinstein monofilament test. Normal tactile sensation bilaterally. Nail Exam: Pt has thick disfigured discolored nails with subungual debris noted bilateral entire nail hallux through fifth toenails Ulcer Exam: There is no evidence of ulcer or pre-ulcerative changes or infection. Orthopedic Exam: Muscle tone and strength are WNL. No limitations in general ROM. No crepitus or effusions noted. HAV 1st MPJ  B/L Skin: No Porokeratosis. No infection or ulcers.    Diagnosis:  Onychomycosis, , Pain in right toe, pain in left toes  Treatment & Plan Procedures and Treatment: Consent by patient was obtained for treatment procedures. The patient understood the discussion of treatment and procedures well. All questions were answered thoroughly reviewed. Debridement of mycotic and hypertrophic toenails, 1 through 5 bilateral and clearing of subungual debris. No ulceration, no infection noted.  Return Visit-Office Procedure: Patient instructed to return to the office for a follow up visit 3 months for continued evaluation and treatment.    Helane GuntherGregory Haaris Metallo DPM

## 2015-11-02 NOTE — Telephone Encounter (Signed)
Spoke with patient's wife, patient is still in AFIB and will be going next week to get heart shocked.  Patient needs HST.  Per Cardiologist, patient needs to be on CPAP urgently in order to control his AFIB.  Spoke with Dr. Maple HudsonYoung, he said that it would be fine to do HST even though patient is in AFIB.  Spoke with Synetta FailAnita and Almyra FreeLibby regarding [employer].  Synetta Failnita has a machine and will give machine to patient today for study.  Attempted to contact patient/patient's wife, left message on voicemail for patient's wife, Luis Yu to call me back.  [place]

## 2015-11-02 NOTE — Addendum Note (Signed)
Addended by: Tommie SamsSILVA, Mahkai Fangman S on: 11/02/2015 09:24 AM   Modules accepted: Orders

## 2015-11-02 NOTE — Telephone Encounter (Signed)
Called spoke with pt wife. She will come this afternoon to pick up machine b/w 2- 2:30. nothing further needed

## 2015-11-02 NOTE — Telephone Encounter (Signed)
Follow Up:    Returning your call from yesterday evening.

## 2015-11-02 NOTE — Telephone Encounter (Signed)
Spoke with spouse. Agreeable to the HST. This has been ordered. She reports they are unable to do the test currently bc pt is in AFIB and is having a lot of problems currently. Pt is seeing his cardiologists and reports that next week they may have to "shock his heart back into rhythm". I advised will make TP aware.

## 2015-11-02 NOTE — Telephone Encounter (Signed)
OK for HST °

## 2015-11-03 ENCOUNTER — Ambulatory Visit (INDEPENDENT_AMBULATORY_CARE_PROVIDER_SITE_OTHER): Payer: Medicare Other | Admitting: Adult Health

## 2015-11-03 ENCOUNTER — Encounter: Payer: Self-pay | Admitting: Adult Health

## 2015-11-03 VITALS — BP 130/80 | Temp 98.6°F | Ht 74.0 in | Wt 243.0 lb

## 2015-11-03 DIAGNOSIS — Z0000 Encounter for general adult medical examination without abnormal findings: Secondary | ICD-10-CM

## 2015-11-03 DIAGNOSIS — Z23 Encounter for immunization: Secondary | ICD-10-CM

## 2015-11-03 MED ORDER — PNEUMOCOCCAL 13-VAL CONJ VACC IM SUSP: 0.5000 mL | INTRAMUSCULAR | Status: DC

## 2015-11-03 NOTE — Progress Notes (Signed)
Subjective:  Patient presents today for their annual wellness visit.  He is a pleasant 70 year old male who  has a past medical history of Adjustment disorder with mixed emotional features; Atrial flutter (HCC); CAD (coronary artery disease); Depression; Diabetes mellitus type II, uncontrolled (HCC); Diabetic peripheral neuropathy (HCC); Erectile dysfunction; GERD (gastroesophageal reflux disease); Herpes zoster; Hyperlipidemia; Hypertension; Obesity; Osteoarthritis; Postherpetic neuralgia; and Sleep apnea.   Preventive Screening-Counseling & Management  Smoking Status: Never Smoker Second Hand Smoking status:No smokers in home  Risk Factors Regular exercise: None Diet: Does not follow a diabetic diet Fall Risk: None   Cardiac risk factors advanced age (older than 955 for men, 2865 for women)  Hyperlipidemia: Controlled with Lipitor  Diabetes: Not controlled but getting better.  Family History: Mother and Father with Coronary Artery Disease A fib - Cardioversion scheduled. On Eliquis 5 mg an Bystolic   Depression Screen None. PHQ2 0   Activities of Daily Living Independent ADLs and IADLs   Hearing Difficulties: patient declines  Cognitive Testing No reported trouble.   Normal 3 word recall  List the Names of Other Physician/Practitioners you currently use: 1.Cardiology - Hochrein/ Crenshaw 2. Pulm - Dr Vassie LollAlva / NP [place] 3. Podiatry  Immunization History  Administered Date(s) Administered  . Influenza Split 12/15/2010  . Influenza Whole 01/20/1996, 02/10/2007, 12/21/2008, 12/06/2009  . Influenza, High Dose Seasonal PF 02/18/2015  . Influenza,inj,Quad PF,36+ Mos 02/09/2013, 02/08/2014  . Pneumococcal Polysaccharide-23 02/10/2007  . Td 03/20/2001  . Tdap 07/25/2011  . Zoster 05/30/2009, 05/31/2009   Required Immunizations needed today: Prevnar 13  Screening tests- Needs colonoscopy. He does not want to have it right now Health Maintenance Due  Topic  Date Due  . Hepatitis C Screening  Feb 27, 1946  . PNA vac Low Risk Adult (1 of 2 - PCV13) 06/14/2009  . COLONOSCOPY  03/20/2010  . FOOT EXAM  06/23/2015  . INFLUENZA VACCINE  10/18/2015    ROS- No pertinent positives discovered in course of AWV  The following were reviewed and entered/updated in epic: Past Medical History:  Diagnosis Date  . Adjustment disorder with mixed emotional features   . Atrial flutter (HCC)   . CAD (coronary artery disease)   . Depression   . Diabetes mellitus type II, uncontrolled (HCC)   . Diabetic peripheral neuropathy (HCC)   . Erectile dysfunction   . GERD (gastroesophageal reflux disease)   . Herpes zoster   . Hyperlipidemia   . Hypertension   . Obesity   . Osteoarthritis   . Postherpetic neuralgia   . Sleep apnea    Patient Active Problem List   Diagnosis Date Noted  . Atrial fibrillation (HCC) 10/27/2015  . Uncoordinated movements 02/24/2014  . Unspecified hereditary and idiopathic peripheral neuropathy 02/24/2013  . Low back pain 02/24/2013  . Abnormality of gait 02/09/2013  . Memory loss, short term 12/05/2011  . LUMBAR STRAIN, ACUTE 05/05/2010  . OSA (obstructive sleep apnea) 09/14/2009  . BPH (benign prostatic hyperplasia) 07/27/2009  . CARDIOVASCULAR STUDIES, ABNORMAL 07/27/2009  . CHEST PAIN 06/08/2009  . OSTEOARTHRITIS 05/30/2009  . HEEL PAIN, LEFT 05/24/2009  . Dyspnea 05/24/2009  . ADJUSTMENT DISORDER WITH MIXED FEATURES 08/25/2008  . Diabetes type 2, uncontrolled (HCC) 02/24/2008  . ERECTILE DYSFUNCTION 02/10/2007  . Hyperlipidemia 10/09/2006  . OBESITY 10/09/2006  . Essential hypertension 10/09/2006  . Coronary atherosclerosis 10/09/2006  . GERD 10/09/2006  . DM (diabetes mellitus) type II uncontrolled with retinopathy 06/10/2006   Past Surgical History:  Procedure Laterality  Date  . CORONARY ARTERY BYPASS GRAFT  1996   eith a LIMA to the LAD, saphenous vein graft to the acute marginal, saphenous vein graft to the  PDA and saphenous vein graft to the circumflex.  [name] KNEE ARTHROSCOPY  05/2004   right knee  . REPLACEMENT TOTAL KNEE BILATERAL    . SHOULDER SURGERY      Family History  Problem Relation Age of Onset  . Coronary artery disease Mother     deceased at 29  . Coronary artery disease Father     died age 76  . Stroke      half brother  . Stroke Brother     Medications- reviewed and updated Current Outpatient Prescriptions  Medication Sig Dispense Refill  . apixaban (ELIQUIS) 5 MG TABS tablet Take 1 tablet (5 mg total) by mouth 2 (two) times daily. 60 tablet 6  . aspirin EC 81 MG tablet Take 1 tablet (81 mg total) by mouth daily.    [name] atorvastatin (LIPITOR) 80 MG tablet Take 1 tablet (80 mg total) by mouth daily. 90 tablet 1  . buPROPion (WELLBUTRIN XL) 300 MG 24 hr tablet Take 1 tablet (300 mg total) by mouth daily. 90 tablet 3  . Cyanocobalamin (B-12) 5000 MCG SUBL Place 1 tablet under the tongue once a week.    . fluticasone (FLONASE) 50 MCG/ACT nasal spray Place 2 sprays into both nostrils daily.    [name] gabapentin (NEURONTIN) 300 MG capsule TAKE ONE CAPSULE EVERY MORNING AND TAKE 2 CAPSULES EVERY EVENING 90 capsule 5  . glipiZIDE (GLUCOTROL) 10 MG tablet Take 1 tablet (10 mg total) by mouth 2 (two) times daily before a meal. 180 tablet 3  . glucose blood (ACCU-CHEK ACTIVE STRIPS) test strip Use as instructed 100 each 12  . Insulin Pen Needle (PEN NEEDLES 31GX5/16") 31G X 8 MM MISC Use to inject insulin 5 times a day. 100 each 11  . isosorbide mononitrate (IMDUR) 30 MG 24 hr tablet Take 1 tablet (30 mg total) by mouth daily. 30 tablet 12  . Lancets (ACCU-CHEK MULTICLIX) lancets Use up to 5 times a day to check blood sugar. 100 each 5  . lisinopril-hydrochlorothiazide (PRINZIDE,ZESTORETIC) 20-12.5 MG tablet TAKE 1 TABLET BY MOUTH EVERY DAY 90 tablet 1  . metFORMIN (GLUCOPHAGE) 1000 MG tablet Take 1 tablet (1,000 mg total) by mouth 2 (two) times daily with a meal. 180 tablet 3  . Nebivolol  HCl 20 MG TABS Take 1 tablet (20 mg total) by mouth daily. 90 tablet 1  . NOVOLOG FLEXPEN 100 UNIT/ML FlexPen USE 15 TO 30 UNITS 15 MINUTES BEFORE MEALS 3 TIMES DAILY 5 pen 0  . omeprazole (PRILOSEC) 20 MG capsule TAKE 1 CAPSULE (20 MG TOTAL) BY MOUTH 2 (TWO) TIMES DAILY BEFORE A MEAL. 180 capsule 0  . polyethylene glycol (MIRALAX / GLYCOLAX) packet Take 17 g by mouth daily. 17 grams mixed with water two times a day.     Nathen May SOLOSTAR 300 UNIT/ML SOPN INJECT 30 UNITS INTO THE SKIN AT BEDTIME. 4.5 mL 1   Current Facility-Administered Medications  Medication Dose Route Frequency Provider Last Rate Last Dose  . [START ON 11/04/2015] pneumococcal 13-valent conjugate vaccine (PREVNAR 13) injection 0.5 mL  0.5 mL Intramuscular Tomorrow-1000 Shirline Frees, NP        Allergies-reviewed and updated No Known Allergies  Social History   Social History  . Marital status: Married    Spouse name: [name]  . Number of children: 1  .  Years of education: 2313   Occupational History  . Retired Naval architecttruck driver Retired   Social History Main Topics  . Smoking status: Never Smoker  . Smokeless tobacco: Never Used  . Alcohol use 1.2 oz/week    2 Standard drinks or equivalent per week     Comment: twice a month.  . Drug use: No  . Sexual activity: Yes    Partners: Female   Other Topics Concern  . Not on file   Social History Narrative   Married [name]([name])   Regular exercise: none   Caffeine use: cup of coffee daily    Retired Naval architecttruck driver   Caffeine- twice daily             Objective: BP 130/80   Temp 98.6 F (37 C) (Oral)   Ht 6\' 2"  (1.88 m)   Wt 243 lb (110.2 kg)   SpO2 96%   BMI 31.20 kg/m  Gen: NAD, resting comfortably HEENT: Mucous membranes are moist. Oropharynx normal Neck: no thyromegaly CV: A fib no murmurs rubs or gallops Lungs: CTAB no crackles, wheeze, rhonchi. SOB with minimal exertion Abdomen: soft/nontender/nondistended/normal bowel sounds. No rebound or guarding.    Ext: no edema Skin: warm, dry Neuro: grossly normal, moves all extremities, PERRLA  Assessment/Plan: 1. Encounter for Medicare annual wellness exam - Reviewed AWE paperwork in detail with patient  - Reviewed needed vaccinations and testing  - He is having a cardioversion in one week for A fib. He is is good spirits about this.  - Follow up in one year for next AWE  - Blood sugars have been much better controlled.   2. Need for vaccination with 13-polyvalent pneumococcal conjugate vaccine - pneumococcal 13-valent conjugate vaccine (PREVNAR 13) injection 0.5 mL; Inject 0.5 mLs into the muscle tomorrow at 10 am.  Shirline Freesory Julianny Milstein, NP  Meds ordered this encounter  Medications  . pneumococcal 13-valent conjugate vaccine (PREVNAR 13) injection 0.5 mL

## 2015-11-03 NOTE — Patient Instructions (Addendum)
It was great seeing you today!  I wish you the best of luck at your cardioversion next week  Please feel free to follow up with me after the cardioversion. If you need anything in the meantime, please let me know   Health Maintenance, Male A healthy lifestyle and preventative care can promote health and wellness.  Maintain regular health, dental, and eye exams.  Eat a healthy diet. Foods like vegetables, fruits, whole grains, low-fat dairy products, and lean protein foods contain the nutrients you need and are low in calories. Decrease your intake of foods high in solid fats, added sugars, and salt. Get information about a proper diet from your health care provider, if necessary.  Regular physical exercise is one of the most important things you can do for your health. Most adults should get at least 150 minutes of moderate-intensity exercise (any activity that increases your heart rate and causes you to sweat) each week. In addition, most adults need muscle-strengthening exercises on 2 or more days a week.   Maintain a healthy weight. The body mass index (BMI) is a screening tool to identify possible weight problems. It provides an estimate of body fat based on height and weight. Your health care provider can find your BMI and can help you achieve or maintain a healthy weight. For males 20 years and older:  A BMI below 18.5 is considered underweight.  A BMI of 18.5 to 24.9 is normal.  A BMI of 25 to 29.9 is considered overweight.  A BMI of 30 and above is considered obese.  Maintain normal blood lipids and cholesterol by exercising and minimizing your intake of saturated fat. Eat a balanced diet with plenty of fruits and vegetables. Blood tests for lipids and cholesterol should begin at age 820 and be repeated every 5 years. If your lipid or cholesterol levels are high, you are over age 71, or you are at high risk for heart disease, you may need your cholesterol levels checked more  frequently.Ongoing high lipid and cholesterol levels should be treated with medicines if diet and exercise are not working.  If you smoke, find out from your health care provider how to quit. If you do not use tobacco, do not start.  Lung cancer screening is recommended for adults aged 55-80 years who are at high risk for developing lung cancer because of a history of smoking. A yearly low-dose CT scan of the lungs is recommended for people who have at least a 30-pack-year history of smoking and are current smokers or have quit within the past 15 years. A pack year of smoking is smoking an average of 1 pack of cigarettes a day for 1 year (for example, a 30-pack-year history of smoking could mean smoking 1 pack a day for 30 years or 2 packs a day for 15 years). Yearly screening should continue until the smoker has stopped smoking for at least 15 years. Yearly screening should be stopped for people who develop a health problem that would prevent them from having lung cancer treatment.  If you choose to drink alcohol, do not have more than 2 drinks per day. One drink is considered to be 12 oz (360 mL) of beer, 5 oz (150 mL) of wine, or 1.5 oz (45 mL) of liquor.  Avoid the use of street drugs. Do not share needles with anyone. Ask for help if you need support or instructions about stopping the use of drugs.  High blood pressure causes heart disease and  increases the risk of stroke. High blood pressure is more likely to develop in:  People who have blood pressure in the end of the normal range (100-139/85-89 mm Hg).  People who are overweight or obese.  People who are African American.  If you are 45-58 years of age, have your blood pressure checked every 3-5 years. If you are 73 years of age or older, have your blood pressure checked every year. You should have your blood pressure measured twice--once when you are at a hospital or clinic, and once when you are not at a hospital or clinic. Record the  average of the two measurements. To check your blood pressure when you are not at a hospital or clinic, you can use:  An automated blood pressure machine at a pharmacy.  A home blood pressure monitor.  If you are 65-34 years old, ask your health care provider if you should take aspirin to prevent heart disease.  Diabetes screening involves taking a blood sample to check your fasting blood sugar level. This should be done once every 3 years after age 71 if you are at a normal weight and without risk factors for diabetes. Testing should be considered at a younger age or be carried out more frequently if you are overweight and have at least 1 risk factor for diabetes.  Colorectal cancer can be detected and often prevented. Most routine colorectal cancer screening begins at the age of 24 and continues through age 38. However, your health care provider may recommend screening at an earlier age if you have risk factors for colon cancer. On a yearly basis, your health care provider may provide home test kits to check for hidden blood in the stool. A small camera at the end of a tube may be used to directly examine the colon (sigmoidoscopy or colonoscopy) to detect the earliest forms of colorectal cancer. Talk to your health care provider about this at age 33 when routine screening begins. A direct exam of the colon should be repeated every 5-10 years through age 40, unless early forms of precancerous polyps or small growths are found.  People who are at an increased risk for hepatitis B should be screened for this virus. You are considered at high risk for hepatitis B if:  You were born in a country where hepatitis B occurs often. Talk with your health care provider about which countries are considered high risk.  Your parents were born in a high-risk country and you have not received a shot to protect against hepatitis B (hepatitis B vaccine).  You have HIV or AIDS.  You use needles to inject street  drugs.  You live with, or have sex with, someone who has hepatitis B.  You are a man who has sex with other men (MSM).  You get hemodialysis treatment.  You take certain medicines for conditions like cancer, organ transplantation, and autoimmune conditions.  Hepatitis C blood testing is recommended for all people born from [birth year] through 1966 and any individual with known risk factors for hepatitis C.  Healthy men should no longer receive prostate-specific antigen (PSA) blood tests as part of routine cancer screening. Talk to your health care provider about prostate cancer screening.  Testicular cancer screening is not recommended for adolescents or adult males who have no symptoms. Screening includes self-exam, a health care provider exam, and other screening tests. Consult with your health care provider about any symptoms you have or any concerns you have about testicular cancer.  Practice safe sex. Use condoms and avoid high-risk sexual practices to reduce the spread of sexually transmitted infections (STIs).  You should be screened for STIs, including gonorrhea and chlamydia if:  You are sexually active and are younger than 24 years.  You are older than 24 years, and your health care provider tells you that you are at risk for this type of infection.  Your sexual activity has changed since you were last screened, and you are at an increased risk for chlamydia or gonorrhea. Ask your health care provider if you are at risk.  If you are at risk of being infected with HIV, it is recommended that you take a prescription medicine daily to prevent HIV infection. This is called pre-exposure prophylaxis (PrEP). You are considered at risk if:  You are a man who has sex with other men (MSM).  You are a heterosexual man who is sexually active with multiple partners.  You take drugs by injection.  You are sexually active with a partner who has HIV.  Talk with your health care provider about  whether you are at high risk of being infected with HIV. If you choose to begin PrEP, you should first be tested for HIV. You should then be tested every 3 months for as long as you are taking PrEP.  Use sunscreen. Apply sunscreen liberally and repeatedly throughout the day. You should seek shade when your shadow is shorter than you. Protect yourself by wearing long sleeves, pants, a wide-brimmed hat, and sunglasses year round whenever you are outdoors.  Tell your health care provider of new moles or changes in moles, especially if there is a change in shape or color. Also, tell your health care provider if a mole is larger than the size of a pencil eraser.  A one-time screening for abdominal aortic aneurysm (AAA) and surgical repair of large AAAs by ultrasound is recommended for men aged 13-75 years who are current or former smokers.  Stay current with your vaccines (immunizations).   This information is not intended to replace advice given to you by your health care provider. Make sure you discuss any questions you have with your health care provider.   Document Released: 09/01/2007 Document Revised: 03/26/2014 Document Reviewed: 07/31/2010 Elsevier Interactive Patient Education [employer].

## 2015-11-04 ENCOUNTER — Telehealth: Payer: Self-pay | Admitting: Pulmonary Disease

## 2015-11-04 DIAGNOSIS — G4733 Obstructive sleep apnea (adult) (pediatric): Secondary | ICD-10-CM

## 2015-11-04 NOTE — Telephone Encounter (Signed)
Per Dr. Vassie LollAlva:  Sleep Study shows AHI: 55.6/hr.  Lowest Sat: 68% .  Severe Sleep Apnea.  Needs CPAP 10cm.  Nasal Pillows.   Order for CPAP machine placed. Contacted [name] via staff message to get machine ordered STAT. Patient's wife, [name] notified.

## 2015-11-07 ENCOUNTER — Telehealth: Payer: Self-pay | Admitting: Cardiology

## 2015-11-07 NOTE — Telephone Encounter (Signed)
Spoke with pt wife, aware of echo results.  

## 2015-11-07 NOTE — Telephone Encounter (Signed)
New message    Patient wife calling back to speak with nurse on echo results.

## 2015-11-08 ENCOUNTER — Ambulatory Visit (INDEPENDENT_AMBULATORY_CARE_PROVIDER_SITE_OTHER): Payer: Medicare Other

## 2015-11-08 DIAGNOSIS — I4891 Unspecified atrial fibrillation: Secondary | ICD-10-CM | POA: Diagnosis not present

## 2015-11-09 ENCOUNTER — Ambulatory Visit: Payer: Medicare Other | Admitting: Cardiology

## 2015-11-09 ENCOUNTER — Other Ambulatory Visit: Payer: Self-pay | Admitting: *Deleted

## 2015-11-09 DIAGNOSIS — G4733 Obstructive sleep apnea (adult) (pediatric): Secondary | ICD-10-CM | POA: Diagnosis not present

## 2015-11-10 ENCOUNTER — Ambulatory Visit (HOSPITAL_COMMUNITY): Payer: Medicare Other | Admitting: Anesthesiology

## 2015-11-10 ENCOUNTER — Encounter (HOSPITAL_COMMUNITY)
Admission: RE | Disposition: A | Discharge status: Home / Self Care | Payer: Self-pay | Source: Ambulatory Visit | Attending: Cardiology

## 2015-11-10 ENCOUNTER — Other Ambulatory Visit: Payer: Self-pay

## 2015-11-10 ENCOUNTER — Encounter (HOSPITAL_COMMUNITY): Payer: Self-pay | Admitting: *Deleted

## 2015-11-10 ENCOUNTER — Ambulatory Visit (HOSPITAL_COMMUNITY)
Admission: RE | Admit: 2015-11-10 | Discharge: 2015-11-10 | Disposition: A | Discharge status: Home / Self Care | Payer: Medicare Other | Source: Ambulatory Visit | Attending: Cardiology | Admitting: Cardiology

## 2015-11-10 DIAGNOSIS — R001 Bradycardia, unspecified: Secondary | ICD-10-CM | POA: Diagnosis not present

## 2015-11-10 DIAGNOSIS — E1142 Type 2 diabetes mellitus with diabetic polyneuropathy: Secondary | ICD-10-CM | POA: Insufficient documentation

## 2015-11-10 DIAGNOSIS — Z794 Long term (current) use of insulin: Secondary | ICD-10-CM | POA: Insufficient documentation

## 2015-11-10 DIAGNOSIS — I252 Old myocardial infarction: Secondary | ICD-10-CM | POA: Insufficient documentation

## 2015-11-10 DIAGNOSIS — I480 Paroxysmal atrial fibrillation: Secondary | ICD-10-CM | POA: Diagnosis not present

## 2015-11-10 DIAGNOSIS — K219 Gastro-esophageal reflux disease without esophagitis: Secondary | ICD-10-CM | POA: Diagnosis not present

## 2015-11-10 DIAGNOSIS — Z951 Presence of aortocoronary bypass graft: Secondary | ICD-10-CM | POA: Insufficient documentation

## 2015-11-10 DIAGNOSIS — G4730 Sleep apnea, unspecified: Secondary | ICD-10-CM | POA: Diagnosis not present

## 2015-11-10 DIAGNOSIS — M1990 Unspecified osteoarthritis, unspecified site: Secondary | ICD-10-CM | POA: Insufficient documentation

## 2015-11-10 DIAGNOSIS — E785 Hyperlipidemia, unspecified: Secondary | ICD-10-CM | POA: Diagnosis not present

## 2015-11-10 DIAGNOSIS — I4891 Unspecified atrial fibrillation: Secondary | ICD-10-CM | POA: Diagnosis not present

## 2015-11-10 DIAGNOSIS — Z6831 Body mass index (BMI) 31.0-31.9, adult: Secondary | ICD-10-CM | POA: Diagnosis not present

## 2015-11-10 DIAGNOSIS — I2510 Atherosclerotic heart disease of native coronary artery without angina pectoris: Secondary | ICD-10-CM | POA: Insufficient documentation

## 2015-11-10 DIAGNOSIS — E119 Type 2 diabetes mellitus without complications: Secondary | ICD-10-CM | POA: Diagnosis not present

## 2015-11-10 DIAGNOSIS — Z7982 Long term (current) use of aspirin: Secondary | ICD-10-CM | POA: Diagnosis not present

## 2015-11-10 DIAGNOSIS — Z7901 Long term (current) use of anticoagulants: Secondary | ICD-10-CM | POA: Diagnosis not present

## 2015-11-10 DIAGNOSIS — I10 Essential (primary) hypertension: Secondary | ICD-10-CM | POA: Diagnosis not present

## 2015-11-10 HISTORY — PX: CARDIOVERSION: SHX1299

## 2015-11-10 LAB — GLUCOSE, CAPILLARY
Glucose-Capillary: 87 mg/dL (ref 65–99)
Glucose-Capillary: 95 mg/dL (ref 65–99)

## 2015-11-10 SURGERY — CARDIOVERSION
Anesthesia: Monitor Anesthesia Care

## 2015-11-10 MED ORDER — PROPOFOL 10 MG/ML IV BOLUS
INTRAVENOUS | Status: DC | PRN
  Administered 2015-11-10: 70 mg via INTRAVENOUS

## 2015-11-10 MED ORDER — LIDOCAINE 2% (20 MG/ML) 5 ML SYRINGE
INTRAMUSCULAR | Status: DC | PRN
  Administered 2015-11-10: 20 mg via INTRAVENOUS

## 2015-11-10 MED ORDER — SODIUM CHLORIDE 0.9 % IV SOLN: INTRAVENOUS | Status: DC

## 2015-11-10 NOTE — Anesthesia Procedure Notes (Signed)
Procedure Name: MAC Date/Time: 11/10/2015 12:13 PM Performed by: Quentin OreWALKER, Luis Mangiaracina E Pre-anesthesia Checklist: Patient identified, Emergency Drugs available, Suction available, Patient being monitored and Timeout performed Patient Re-evaluated:Patient Re-evaluated prior to inductionOxygen Delivery Method: Ambu bag Preoxygenation: Pre-oxygenation with 100% oxygen Intubation Type: IV induction Ventilation: Mask ventilation without difficulty

## 2015-11-10 NOTE — Discharge Instructions (Signed)
Electrical Cardioversion, Care After °Refer to this sheet in the next few weeks. These instructions provide you with information on caring for yourself after your procedure. Your health care provider may also give you more specific instructions. Your treatment has been planned according to current medical practices, but problems sometimes occur. Call your health care provider if you have any problems or questions after your procedure. °WHAT TO EXPECT AFTER THE PROCEDURE °After your procedure, it is typical to have the following sensations: °· Some redness on the skin where the shocks were delivered. If this is tender, a sunburn lotion or hydrocortisone cream may help. °· Possible return of an abnormal heart rhythm within hours or days after the procedure. °HOME CARE INSTRUCTIONS °· Take medicines only as directed by your health care provider. Be sure you understand how and when to take your medicine. °· Learn how to feel your pulse and check it often. °· Limit your activity for 48 hours after the procedure or as directed by your health care provider. °· Avoid or minimize caffeine and other stimulants as directed by your health care provider. °SEEK MEDICAL CARE IF: °· You feel like your heart is beating too fast or your pulse is not regular. °· You have any questions about your medicines. °· You have bleeding that will not stop. °SEEK IMMEDIATE MEDICAL CARE IF: °· You are dizzy or feel faint. °· It is hard to breathe or you feel short of breath. °· There is a change in discomfort in your chest. °· Your speech is slurred or you have trouble moving an arm or leg on one side of your body. °· You get a serious muscle cramp that does not go away. °· Your fingers or toes turn cold or blue. °  °This information is not intended to replace advice given to you by your health care provider. Make sure you discuss any questions you have with your health care provider. °  °Document Released: 12/24/2012 Document Revised: 03/26/2014  Document Reviewed: 12/24/2012 °Elsevier Interactive Patient Education ©2016 Elsevier Inc. ° °

## 2015-11-10 NOTE — Interval H&P Note (Signed)
History and Physical Interval Note:  11/10/2015 12:13 PM  Luis Yu  has presented today for surgery, with the diagnosis of AFIB  The various methods of treatment have been discussed with the patient and family. After consideration of risks, benefits and other options for treatment, the patient has consented to  Procedure(s): CARDIOVERSION (N/A) as a surgical intervention .  The patient's history has been reviewed, patient examined, no change in status, stable for surgery.  I have reviewed the patient's chart and labs.  Questions were answered to the patient's satisfaction.     Dalton [employer]ape[employer] EnergyMcLean

## 2015-11-10 NOTE — H&P (View-Only) (Signed)
Cardiology Office Note   Date:  10/31/2015   ID:  Luis Yu, DOB 20-Oct-1945, MRN [id number]  PCP:  Thomos Lemonsobert Yoo, DO  Cardiologist:   Rollene RotundaJames Caitland Porchia, MD   Chief Complaint  Patient presents with  . Shortness of Breath      History of Present Illness: Luis Yu is a 70 y.o. male who presents for evaluation of atrial fib.   He is seen for Dr. Jens Somrenshaw.  He has a history of CABG x 3 years ago.  Cath in May of 2011 revealed triple-vessel coronary artery disease status post three-vessel CABG with 3/3 patent grafts. He had severe stenosis in the proximal and distal body of the saphenous vein graft to the left-sided PDA. There was normal left ventricular systolic function. The patient had PCI with a drug-eluting stent at that time. He had repeat cardiac cath in Dec of 2011 for dyspnea and f/u abnormal myoview. This revealed an EF of 50%, continued patency of internal mammary to the LAD, occluded saphenous vein graft  to the distal circumflex/PDA territory and progressive disease in the saphenous vein graft to the OM. The patient was evaluated by CVTS. Distal vessels were felt to be poor targets. It was decided that CABG could be considered in the future if PCI options were exhausted. The patient had PCI of the saphenous vein graft to the obtuse marginal. He was last seen by [name] in late July after [facility].  He was told that he had a heart attack but was managed medically.  (We still do not have these records).  He was in urgent care after this for SOB EKG showed new atrial fib.  EKG on last appt demonstrated atrial flutter.   At that appt the Plavix was stopped and Eliquis was started.  A stress test was recommended.   The patient had no ischemia.    He returns for follow-up with continued dyspnea. He short of breath walking 20 yards on level ground. He has been seen by primary care center chest x-Frady which demonstrated no acute disease. Labs  demonstrated normal TSH and CBC recently. His creatinine is mildly increased. He's not describing any PND or orthopnea. He's not having any significant palpitations, presyncope or syncope. He's not having any chest pain. Is much more fatigued than he was prior to the diagnosis of fibrillation earlier this year.  Of note he had a severe hypoglycemic episode last weekend EMS was called in his house.   Past Medical History:  Diagnosis Date  . Adjustment disorder with mixed emotional features   . Atrial flutter (HCC)   . CAD (coronary artery disease)   . Depression   . Diabetes mellitus type II, uncontrolled (HCC)   . Diabetic peripheral neuropathy (HCC)   . Erectile dysfunction   . GERD (gastroesophageal reflux disease)   . Herpes zoster   . Hyperlipidemia   . Hypertension   . Obesity   . Osteoarthritis   . Postherpetic neuralgia   . Sleep apnea     Past Surgical History:  Procedure Laterality Date  . CORONARY ARTERY BYPASS GRAFT  1996   eith a LIMA to the LAD, saphenous vein graft to the acute marginal, saphenous vein graft to the PDA and saphenous vein graft to the circumflex.  [name]. KNEE ARTHROSCOPY  05/2004   right knee  . REPLACEMENT TOTAL KNEE BILATERAL    . SHOULDER SURGERY       Current  Outpatient Prescriptions  Medication Sig Dispense Refill  . apixaban (ELIQUIS) 5 MG TABS tablet Take 1 tablet (5 mg total) by mouth 2 (two) times daily. 60 tablet 6  . aspirin EC 81 MG tablet Take 1 tablet (81 mg total) by mouth daily.    [name]. atorvastatin (LIPITOR) 80 MG tablet Take 1 tablet (80 mg total) by mouth daily. 90 tablet 1  . buPROPion (WELLBUTRIN XL) 300 MG 24 hr tablet Take 1 tablet (300 mg total) by mouth daily. 90 tablet 3  . Cyanocobalamin (B-12) 5000 MCG SUBL Place 1 tablet under the tongue once a week.    . fluticasone (FLONASE) 50 MCG/ACT nasal spray Place 2 sprays into both nostrils daily.    [name]. gabapentin (NEURONTIN) 300 MG capsule TAKE ONE CAPSULE EVERY MORNING AND TAKE 2  CAPSULES EVERY EVENING 90 capsule 5  . glipiZIDE (GLUCOTROL) 10 MG tablet Take 1 tablet (10 mg total) by mouth 2 (two) times daily before a meal. 180 tablet 3  . glucose blood (ACCU-CHEK ACTIVE STRIPS) test strip Use as instructed 100 each 12  . Insulin Pen Needle (PEN NEEDLES 31GX5/16") 31G X 8 MM MISC Use to inject insulin 5 times a day. 100 each 11  . isosorbide mononitrate (IMDUR) 30 MG 24 hr tablet Take 1 tablet (30 mg total) by mouth daily. 30 tablet 12  . Lancets (ACCU-CHEK MULTICLIX) lancets Use up to 5 times a day to check blood sugar. 100 each 5  . lisinopril-hydrochlorothiazide (PRINZIDE,ZESTORETIC) 20-12.5 MG tablet TAKE 1 TABLET BY MOUTH EVERY DAY 90 tablet 1  . metFORMIN (GLUCOPHAGE) 1000 MG tablet Take 1 tablet (1,000 mg total) by mouth 2 (two) times daily with a meal. 180 tablet 3  . Nebivolol HCl 20 MG TABS Take 1 tablet (20 mg total) by mouth daily. 90 tablet 1  . NOVOLOG FLEXPEN 100 UNIT/ML FlexPen USE 15 TO 30 UNITS 15 MINUTES BEFORE MEALS 3 TIMES DAILY 5 pen 0  . omeprazole (PRILOSEC) 20 MG capsule TAKE 1 CAPSULE (20 MG TOTAL) BY MOUTH 2 (TWO) TIMES DAILY BEFORE A MEAL. 180 capsule 0  . polyethylene glycol (MIRALAX / GLYCOLAX) packet Take 17 g by mouth daily. 17 grams mixed with water two times a day.     Nathen May. TOUJEO SOLOSTAR 300 UNIT/ML SOPN INJECT 30 UNITS INTO THE SKIN AT BEDTIME. 4.5 mL 1   No current facility-administered medications for this visit.     Allergies:   Review of patient's allergies indicates no known allergies.    Social History:  The patient  reports that he has never smoked. He has never used smokeless tobacco. He reports that he drinks about 1.2 oz of alcohol per week . He reports that he does not use drugs.   Family History:  The patient's family history includes Coronary artery disease in his father and mother; Stroke in his brother.   ROS:  Please see the history of present illness.   Otherwise, review of systems are positive for none .   All other  systems are reviewed and negative.    PHYSICAL EXAM: VS:  BP (!) 160/82 (BP Location: Right Arm, Patient Position: Sitting, Cuff Size: Normal)   Pulse 60   Ht 6\' 2"  (1.88 m)   Wt 237 lb 4 oz (107.6 kg)   SpO2 96%   BMI 30.46 kg/m  , BMI Body mass index is 30.46 kg/m. GENERAL:  Frail appearing HEENT:  Pupils equal round and reactive, fundi not visualized, oral mucosa unremarkable NECK:  No jugular venous distention, waveform within normal limits, carotid upstroke brisk and symmetric, no bruits, no thyromegaly LYMPHATICS:  No cervical, inguinal adenopathy LUNGS:  Clear to auscultation bilaterally BACK:  No CVA tenderness CHEST:  Well healed sternotomy scar. HEART:  PMI not displaced or sustained,S1 and S2 within normal limits, no S3, no clicks, no rubs, no murmurs, irregular ABD:  Flat, positive bowel sounds normal in frequency in pitch, no bruits, no rebound, no guarding, no midline pulsatile mass, no hepatomegaly, no splenomegaly EXT:  2 plus pulses throughout, no edema, no cyanosis no clubbing SKIN:  No rashes no nodules NEURO:  Cranial nerves II through XII grossly intact, motor grossly intact throughout PSYCH:  Cognitively intact, oriented to person place and time    EKG:  EKG is not ordered today.    Recent Labs: 05/13/2015: ALT 16 10/14/2015: BUN 27; Creat 1.23; Hemoglobin 12.9; Platelets 142; Potassium 5.2; Sodium 144; TSH 1.15    Lipid Panel    Component Value Date/Time   CHOL 142 05/13/2015 1708   TRIG 77 05/13/2015 1708   HDL 54 05/13/2015 1708   CHOLHDL 2.6 05/13/2015 1708   VLDL 15 05/13/2015 1708   LDLCALC 73 05/13/2015 1708      Wt Readings from Last 3 Encounters:  10/31/15 237 lb 4 oz (107.6 kg)  10/27/15 239 lb 6.4 oz (108.6 kg)  10/27/15 239 lb (108.4 kg)      Other studies Reviewed: Additional studies/ records that were reviewed today include:   Lexiscan Myoview.. Review of the above records demonstrates:  Please see elsewhere in the note.      ASSESSMENT AND PLAN:   CAD:    He had a negative stress perfusion study. He will continue with medical management.  DYSPNEA:  I suspect that this is related to his fibrillation. And check a BNP. I'm going to repeat an echocardiogram [place] He clearly seems to be progressively more dyspneic since then.  PAF:   Luis Yu has a CHA2DS2 - VASc score of 4 with a risk of stroke of 4%.  discovered 3 weeks ago by EKG at urgent care. I'm going to continue his anticoagulation. He's not been able to long-term after he's been cardioverted might need warfarin.  I am going to planan cardioversion but first we'll check a Holter monitor to make sure he has persistent atrial fibrillation.  For now review of the recent EKGs demonstrates good rate control.  He will otherwise remain on the meds as listed.  SLEEP APNEA:  This has been diagnosed and he is due to get CPAP.   Current medicines are reviewed at length with the patient today.  The patient does not have concerns regarding medicines.  The following changes have been made:  no change  Labs/ tests ordered today include:   Orders Placed This Encounter  Procedures  . Basic metabolic panel  . Holter monitor - 24 hour  . ECHOCARDIOGRAM COMPLETE     Disposition:   FU with Dr. Jens Somrenshaw and with [facility].     Signed, Rollene RotundaJames Zalayah Pizzuto, MD  10/31/2015 12:31 PM    [facility]

## 2015-11-10 NOTE — Procedures (Signed)
Electrical Cardioversion Procedure Note Luis ImusDonald W Yu [id number] 1945/11/02  Procedure: Electrical Cardioversion Indications:  Atrial Fibrillation.  He has not missed any Eliquis doses.   Procedure Details Consent: Risks of procedure as well as the alternatives and risks of each were explained to the (patient/caregiver).  Consent for procedure obtained. Time Out: Verified patient identification, verified procedure, site/side was marked, verified correct patient position, special equipment/implants available, medications/allergies/relevent history reviewed, required imaging and test results available.  Performed  Patient placed on cardiac monitor, pulse oximetry, supplemental oxygen as necessary.  Sedation given: per anesthesiology Pacer pads placed anterior and posterior chest.  Cardioverted 1 time(s).  Cardioverted at 200J.  Evaluation Findings: Post procedure EKG shows: NSR Complications: None Patient did tolerate procedure well.   Luis Yu 11/10/2015, 12:17 PM

## 2015-11-10 NOTE — Anesthesia Preprocedure Evaluation (Addendum)
Anesthesia Evaluation  Patient identified by MRN, date of birth, ID band Patient awake    Reviewed: Allergy & Precautions, NPO status , Patient's Chart, lab work & pertinent test results  History of Anesthesia Complications Negative for: history of anesthetic complications  Airway Mallampati: I  TM Distance: >3 FB Neck ROM: Full    Dental  (+) Edentulous Upper, Partial Lower, Dental Advisory Given   Pulmonary sleep apnea ,    breath sounds clear to auscultation       Cardiovascular hypertension, + CAD and + CABG  + dysrhythmias Atrial Fibrillation  Rhythm:Irregular Rate:Normal  8/17 ECHO:  EF 55-60%, valves OK   Neuro/Psych Depression negative neurological ROS     GI/Hepatic Neg liver ROS, GERD  Medicated and Controlled,  Endo/Other  diabetes (glu 87), Insulin Dependent, Oral Hypoglycemic AgentsMorbid obesity  Renal/GU negative Renal ROS     Musculoskeletal   Abdominal (+) + obese,   Peds  Hematology  (+) Blood dyscrasia (eliquis), ,   Anesthesia Other Findings   Reproductive/Obstetrics                           Anesthesia Physical Anesthesia Plan  ASA: III  Anesthesia Plan: MAC   Post-op Pain Management:    Induction: Intravenous  Airway Management Planned: Mask  Additional Equipment:   Intra-op Plan:   Post-operative Plan:   Informed Consent: I have reviewed the patients History and Physical, chart, labs and discussed the procedure including the risks, benefits and alternatives for the proposed anesthesia with the patient or authorized representative who has indicated his/her understanding and acceptance.   Dental advisory given  Plan Discussed with: Anesthesiologist and CRNA  Anesthesia Plan Comments: (Plan routine monitors, MAC)       Anesthesia Quick Evaluation

## 2015-11-10 NOTE — Anesthesia Postprocedure Evaluation (Signed)
Anesthesia Post Note  Patient: Luis Yu  Procedure(s) Performed: Procedure(s) (LRB): CARDIOVERSION (N/A)  Patient location during evaluation: Endoscopy Anesthesia Type: MAC Level of consciousness: awake and alert, patient cooperative and oriented Pain management: pain level controlled Vital Signs Assessment: post-procedure vital signs reviewed and stable Respiratory status: spontaneous breathing, nonlabored ventilation and respiratory function stable Cardiovascular status: blood pressure returned to baseline and stable Postop Assessment: no signs of nausea or vomiting Anesthetic complications: no    Last Vitals:  Vitals:   11/10/15 1221 11/10/15 1225  BP:  (!) 158/65  Pulse: 62 61  Resp: (!) 29 (!) 24  Temp:      Last Pain:  Vitals:   11/10/15 1112  TempSrc: Oral                 Sariyah Corcino,E. Celene Pippins

## 2015-11-10 NOTE — Transfer of Care (Signed)
Immediate Anesthesia Transfer of Care Note  Patient: Luis Yu  Procedure(s) Performed: Procedure(s): CARDIOVERSION (N/A)  Patient Location: Endoscopy Unit  Anesthesia Type:General  Level of Consciousness: awake, alert  and oriented  Airway & Oxygen Therapy: Patient Spontanous Breathing and Patient connected to nasal cannula oxygen  Post-op Assessment: Report given to RN, Post -op Vital signs reviewed and stable and Patient moving all extremities X 4  Post vital signs: Reviewed and stable  Last Vitals:  Vitals:   11/10/15 1220 11/10/15 1221  BP: (!) 154/70   Pulse: 62 62  Resp: (!) 30 (!) 29  Temp:      Last Pain:  Vitals:   11/10/15 1112  TempSrc: Oral         Complications: No apparent anesthesia complications

## 2015-11-11 ENCOUNTER — Encounter (HOSPITAL_COMMUNITY): Payer: Self-pay | Admitting: Cardiology

## 2015-11-11 ENCOUNTER — Telehealth: Payer: Self-pay

## 2015-11-11 ENCOUNTER — Ambulatory Visit (INDEPENDENT_AMBULATORY_CARE_PROVIDER_SITE_OTHER): Payer: Medicare Other | Admitting: Adult Health

## 2015-11-11 VITALS — BP 118/58 | Temp 98.6°F | Ht 74.0 in | Wt 237.4 lb

## 2015-11-11 DIAGNOSIS — R059 Cough, unspecified: Secondary | ICD-10-CM

## 2015-11-11 DIAGNOSIS — I25119 Atherosclerotic heart disease of native coronary artery with unspecified angina pectoris: Secondary | ICD-10-CM | POA: Diagnosis not present

## 2015-11-11 DIAGNOSIS — R05 Cough: Secondary | ICD-10-CM | POA: Diagnosis not present

## 2015-11-11 MED ORDER — HYDROCODONE-HOMATROPINE 5-1.5 MG/5ML PO SYRP: 5.0000 mL | ORAL_SOLUTION | Freq: Three times a day (TID) | ORAL | 0 refills | Status: DC | PRN

## 2015-11-11 NOTE — Telephone Encounter (Signed)
Spoke to caller.  [name], pt's wife states persistent hard cough, no SOB, some wheezing. She notes she had also called [facility] and spoken w RN there who had advised PCP f/u for common post-cardioversion symptom of persistent cough to r/o resp infection. I agreed w/ these recommendations. Called acknowledged. Notes they contacted PCP, awaiting call back for work-in. I advised urgent care for evaluation today or tomorrow if unable to see PCP. Advised ED if worse, particularly if pt develops any shortness of breath.  She voiced thanks and understanding.

## 2015-11-11 NOTE — Telephone Encounter (Signed)
New  Message    cardioversion on yesterday C/O coughing - hard . Wants to know what can she give him.

## 2015-11-11 NOTE — Telephone Encounter (Signed)
Eliquis 5 mg samples, 3 boxes provided to patient. Also gave them Patient Assistance forms to fill out and return to [facility] office.

## 2015-11-11 NOTE — Progress Notes (Signed)
Subjective:    Patient ID: Luis Yu, male    DOB: 24-Jun-1945, 70 y.o.   MRN: [id number]  HPI  70 year old male  has a past medical history of Adjustment disorder with mixed emotional features; Atrial flutter (HCC); CAD (coronary artery disease); Depression; Diabetes mellitus type II, uncontrolled (HCC); Diabetic peripheral neuropathy (HCC); Erectile dysfunction; GERD (gastroesophageal reflux disease); Herpes zoster; Hyperlipidemia; Hypertension; Obesity; Osteoarthritis; Postherpetic neuralgia; and Sleep apnea.  He presents with his wife for the acute complaint of a non productive cough since yesterday. He reports having a cardiac ablation yesterday and once he got home and got ready for bed he started to have this dry cough.   He denies any fevers or feeling acutely ill. Denies any CP but continues to have SOB with exertion.   Review of Systems  Constitutional: Negative.   Respiratory: Positive for apnea, cough and shortness of breath. Negative for choking, chest tightness, wheezing and stridor.   Cardiovascular: Negative.   Neurological: Negative.   All other systems reviewed and are negative.  Past Medical History:  Diagnosis Date  . Adjustment disorder with mixed emotional features   . Atrial flutter (HCC)   . CAD (coronary artery disease)   . Depression   . Diabetes mellitus type II, uncontrolled (HCC)   . Diabetic peripheral neuropathy (HCC)   . Erectile dysfunction   . GERD (gastroesophageal reflux disease)   . Herpes zoster   . Hyperlipidemia   . Hypertension   . Obesity   . Osteoarthritis   . Postherpetic neuralgia   . Sleep apnea     Social History   Social History  . Marital status: Married    Spouse name: [name]  . Number of children: 1  . Years of education: 54   Occupational History  . Retired Naval architect Retired   Social History Main Topics  . Smoking status: Never Smoker  . Smokeless tobacco: Never Used  . Alcohol use 1.2 oz/week    2  Standard drinks or equivalent per week     Comment: twice a month.  . Drug use: No  . Sexual activity: Yes    Partners: Female   Other Topics Concern  . Not on file   Social History Narrative   Married [name])   Regular exercise: none   Caffeine use: cup of coffee daily    Retired Naval architect   Caffeine- twice daily             Past Surgical History:  Procedure Laterality Date  . CARDIOVERSION N/A 11/10/2015   Procedure: CARDIOVERSION;  Surgeon: Laurey Morale, MD;  Location: [facility] ENDOSCOPY;  Service: Cardiovascular;  Laterality: N/A;  . CORONARY ARTERY BYPASS GRAFT  1996   eith a LIMA to the LAD, saphenous vein graft to the acute marginal, saphenous vein graft to the PDA and saphenous vein graft to the circumflex.  [name] KNEE ARTHROSCOPY  05/2004   right knee  . REPLACEMENT TOTAL KNEE BILATERAL    . SHOULDER SURGERY      Family History  Problem Relation Age of Onset  . Coronary artery disease Mother     deceased at 62  . Coronary artery disease Father     died age 23  . Stroke      half brother  . Stroke Brother     No Known Allergies  Current Outpatient Prescriptions on File Prior to Visit  Medication Sig Dispense Refill  . apixaban (ELIQUIS) 5 MG TABS  tablet Take 1 tablet (5 mg total) by mouth 2 (two) times daily. 60 tablet 6  . aspirin EC 81 MG tablet Take 1 tablet (81 mg total) by mouth daily.    [name]. atorvastatin (LIPITOR) 80 MG tablet Take 1 tablet (80 mg total) by mouth daily. 90 tablet 1  . buPROPion (WELLBUTRIN XL) 300 MG 24 hr tablet Take 1 tablet (300 mg total) by mouth daily. 90 tablet 3  . Cyanocobalamin (B-12) 5000 MCG SUBL Place 1 tablet under the tongue once a week.    . fluticasone (FLONASE) 50 MCG/ACT nasal spray Place 2 sprays into both nostrils daily as needed for allergies.     [name]. gabapentin (NEURONTIN) 300 MG capsule TAKE ONE CAPSULE EVERY MORNING AND TAKE 2 CAPSULES EVERY EVENING (Patient taking differently: TAKE TWO CAPSULE EVERY MORNING AND TAKE 2  CAPSULES EVERY EVENING) 90 capsule 5  . glipiZIDE (GLUCOTROL) 10 MG tablet Take 1 tablet (10 mg total) by mouth 2 (two) times daily before a meal. 180 tablet 3  . glucose blood (ACCU-CHEK ACTIVE STRIPS) test strip Use as instructed 100 each 12  . Insulin Pen Needle (PEN NEEDLES 31GX5/16") 31G X 8 MM MISC Use to inject insulin 5 times a day. 100 each 11  . isosorbide mononitrate (IMDUR) 30 MG 24 hr tablet Take 1 tablet (30 mg total) by mouth daily. 30 tablet 12  . Lancets (ACCU-CHEK MULTICLIX) lancets Use up to 5 times a day to check blood sugar. 100 each 5  . lisinopril-hydrochlorothiazide (PRINZIDE,ZESTORETIC) 20-12.5 MG tablet TAKE 1 TABLET BY MOUTH EVERY DAY 90 tablet 1  . metFORMIN (GLUCOPHAGE) 1000 MG tablet Take 1 tablet (1,000 mg total) by mouth 2 (two) times daily with a meal. 180 tablet 3  . Nebivolol HCl 20 MG TABS Take 1 tablet (20 mg total) by mouth daily. 90 tablet 1  . NOVOLOG FLEXPEN 100 UNIT/ML FlexPen USE 15 TO 30 UNITS 15 MINUTES BEFORE MEALS 3 TIMES DAILY 5 pen 0  . omeprazole (PRILOSEC) 20 MG capsule TAKE 1 CAPSULE (20 MG TOTAL) BY MOUTH 2 (TWO) TIMES DAILY BEFORE A MEAL. 180 capsule 0  . TOUJEO SOLOSTAR 300 UNIT/ML SOPN INJECT 30 UNITS INTO THE SKIN AT BEDTIME. 4.5 mL 1   Current Facility-Administered Medications on File Prior to Visit  Medication Dose Route Frequency Provider Last Rate Last Dose  . pneumococcal 13-valent conjugate vaccine (PREVNAR 13) injection 0.5 mL  0.5 mL Intramuscular Tomorrow-1000 Nelli Swalley, NP        BP (!) 118/58   Temp 98.6 F (37 C) (Oral)   Ht 6\' 2"  (1.88 m)   Wt 237 lb 6.4 oz (107.7 kg)   BMI 30.48 kg/m       Objective:   Physical Exam  Constitutional: He is oriented to person, place, and time. He appears well-developed and well-nourished. No distress.  HENT:  Head: Normocephalic and atraumatic.  Right Ear: External ear normal.  Left Ear: External ear normal.  Nose: Nose normal.  Mouth/Throat: Oropharynx is clear and moist. No  oropharyngeal exudate.  Eyes: Conjunctivae and EOM are normal. Pupils are equal, round, and reactive to light.  Neck: Normal range of motion. Neck supple.  Cardiovascular: Normal rate, regular rhythm, normal heart sounds and intact distal pulses.  Exam reveals no gallop and no friction rub.   No murmur heard. Pulmonary/Chest: Effort normal and breath sounds normal. No respiratory distress. He has no wheezes. He has no rales. He exhibits no tenderness.  Lymphadenopathy:  He has no cervical adenopathy.  Neurological: He is alert and oriented to person, place, and time.  Skin: Skin is warm and dry. No rash noted. He is not diaphoretic. No erythema. No pallor.  Psychiatric: He has a normal mood and affect. His behavior is normal. Judgment and thought content normal.  Nursing note and vitals reviewed.     Assessment & Plan:  1. Cough - No concern for pneumonia or bronchitis. Possibly due to cardiac ablation  - HYDROcodone-homatropine (HYCODAN) 5-1.5 MG/5ML syrup; Take 5 mLs by mouth every 8 (eight) hours as needed for cough.  Dispense: 120 mL; Refill: 0 - Mucinex Q12 H PRN - Follow up if no improvement  Shirline Frees, NP

## 2015-11-14 ENCOUNTER — Encounter (HOSPITAL_COMMUNITY): Payer: Self-pay | Admitting: Cardiology

## 2015-11-14 NOTE — Addendum Note (Signed)
Addendum  created 11/14/15 1359 by Jairo Benarswell Elihu Milstein, MD   SmartForm saved

## 2015-11-15 ENCOUNTER — Encounter: Payer: Self-pay | Admitting: Adult Health

## 2015-11-15 ENCOUNTER — Ambulatory Visit (INDEPENDENT_AMBULATORY_CARE_PROVIDER_SITE_OTHER): Payer: Medicare Other | Admitting: Adult Health

## 2015-11-15 ENCOUNTER — Other Ambulatory Visit: Payer: Medicare Other

## 2015-11-15 VITALS — BP 140/70 | Temp 97.9°F | Ht 74.0 in | Wt 240.1 lb

## 2015-11-15 DIAGNOSIS — E1165 Type 2 diabetes mellitus with hyperglycemia: Secondary | ICD-10-CM

## 2015-11-15 DIAGNOSIS — IMO0002 Reserved for concepts with insufficient information to code with codable children: Secondary | ICD-10-CM

## 2015-11-15 DIAGNOSIS — I25119 Atherosclerotic heart disease of native coronary artery with unspecified angina pectoris: Secondary | ICD-10-CM | POA: Diagnosis not present

## 2015-11-15 DIAGNOSIS — S91109S Unspecified open wound of unspecified toe(s) without damage to nail, sequela: Secondary | ICD-10-CM | POA: Diagnosis not present

## 2015-11-15 DIAGNOSIS — E1140 Type 2 diabetes mellitus with diabetic neuropathy, unspecified: Secondary | ICD-10-CM

## 2015-11-15 DIAGNOSIS — Z794 Long term (current) use of insulin: Secondary | ICD-10-CM | POA: Diagnosis not present

## 2015-11-15 LAB — GLUCOSE, POCT (MANUAL RESULT ENTRY): POC Glucose: 102 mg/dl — AB (ref 70–99)

## 2015-11-15 MED ORDER — DOXYCYCLINE HYCLATE 100 MG PO CAPS: 100.0000 mg | ORAL_CAPSULE | Freq: Two times a day (BID) | ORAL | 0 refills | Status: DC

## 2015-11-15 NOTE — Progress Notes (Signed)
Subjective:    Patient ID: Luis Yu, male    DOB: Dec 28, 1945, 70 y.o.   MRN: [id number]  HPI  70 year old male who presents to the office today for a wound on his rigth great toe. He reports that about a week ago he noticed a blister on his right great toe. He has been applying neosporin and a bandage. His wife who is with him at this visit reports that the wound is not getting much better.  He denies any signs of infection   Although his blood sugars have bene improving he is still not controlled well.   I saw him last week for a cough and he reports that this has improved.    Review of Systems  Constitutional: Negative.   Respiratory: Positive for cough and shortness of breath. Negative for apnea, choking, chest tightness and wheezing.   Cardiovascular: Negative.   Musculoskeletal: Negative.   Skin: Positive for color change and wound. Negative for pallor and rash.  Neurological: Negative.   All other systems reviewed and are negative.  Past Medical History:  Diagnosis Date  . Adjustment disorder with mixed emotional features   . Atrial flutter (HCC)   . CAD (coronary artery disease)   . Depression   . Diabetes mellitus type II, uncontrolled (HCC)   . Diabetic peripheral neuropathy (HCC)   . Erectile dysfunction   . GERD (gastroesophageal reflux disease)   . Herpes zoster   . Hyperlipidemia   . Hypertension   . Obesity   . Osteoarthritis   . Postherpetic neuralgia   . Sleep apnea     Social History   Social History  . Marital status: Married    Spouse name: [name]  . Number of children: 1  . Years of education: 63   Occupational History  . Retired Naval architect Retired   Social History Main Topics  . Smoking status: Never Smoker  . Smokeless tobacco: Never Used  . Alcohol use 1.2 oz/week    2 Standard drinks or equivalent per week     Comment: twice a month.  . Drug use: No  . Sexual activity: Yes    Partners: Female   Other Topics Concern  .  Not on file   Social History Narrative   Married [name])   Regular exercise: none   Caffeine use: cup of coffee daily    Retired Naval architect   Caffeine- twice daily             Past Surgical History:  Procedure Laterality Date  . CARDIOVERSION N/A 11/10/2015   Procedure: CARDIOVERSION;  Surgeon: Laurey Morale, MD;  Location: [facility] ENDOSCOPY;  Service: Cardiovascular;  Laterality: N/A;  . CORONARY ARTERY BYPASS GRAFT  1996   eith a LIMA to the LAD, saphenous vein graft to the acute marginal, saphenous vein graft to the PDA and saphenous vein graft to the circumflex.  [name] KNEE ARTHROSCOPY  05/2004   right knee  . REPLACEMENT TOTAL KNEE BILATERAL    . SHOULDER SURGERY      Family History  Problem Relation Age of Onset  . Coronary artery disease Mother     deceased at 52  . Coronary artery disease Father     died age 54  . Stroke      half brother  . Stroke Brother     No Known Allergies  Current Outpatient Prescriptions on File Prior to Visit  Medication Sig Dispense Refill  . apixaban (ELIQUIS)  5 MG TABS tablet Take 1 tablet (5 mg total) by mouth 2 (two) times daily. 60 tablet 6  . aspirin EC 81 MG tablet Take 1 tablet (81 mg total) by mouth daily.    [name]. atorvastatin (LIPITOR) 80 MG tablet Take 1 tablet (80 mg total) by mouth daily. 90 tablet 1  . buPROPion (WELLBUTRIN XL) 300 MG 24 hr tablet Take 1 tablet (300 mg total) by mouth daily. 90 tablet 3  . Cyanocobalamin (B-12) 5000 MCG SUBL Place 1 tablet under the tongue once a week.    . fluticasone (FLONASE) 50 MCG/ACT nasal spray Place 2 sprays into both nostrils daily as needed for allergies.     [name]. gabapentin (NEURONTIN) 300 MG capsule TAKE ONE CAPSULE EVERY MORNING AND TAKE 2 CAPSULES EVERY EVENING (Patient taking differently: TAKE TWO CAPSULE EVERY MORNING, 2 CAPSULES IN THE MIDDLE OF DAY AND TAKE 2 CAPSULES EVERY EVENING) 90 capsule 5  . glipiZIDE (GLUCOTROL) 10 MG tablet Take 1 tablet (10 mg total) by mouth 2 (two) times  daily before a meal. 180 tablet 3  . glucose blood (ACCU-CHEK ACTIVE STRIPS) test strip Use as instructed 100 each 12  . HYDROcodone-homatropine (HYCODAN) 5-1.5 MG/5ML syrup Take 5 mLs by mouth every 8 (eight) hours as needed for cough. 120 mL 0  . Insulin Pen Needle (PEN NEEDLES 31GX5/16") 31G X 8 MM MISC Use to inject insulin 5 times a day. 100 each 11  . isosorbide mononitrate (IMDUR) 30 MG 24 hr tablet Take 1 tablet (30 mg total) by mouth daily. 30 tablet 12  . Lancets (ACCU-CHEK MULTICLIX) lancets Use up to 5 times a day to check blood sugar. 100 each 5  . lisinopril-hydrochlorothiazide (PRINZIDE,ZESTORETIC) 20-12.5 MG tablet TAKE 1 TABLET BY MOUTH EVERY DAY 90 tablet 1  . metFORMIN (GLUCOPHAGE) 1000 MG tablet Take 1 tablet (1,000 mg total) by mouth 2 (two) times daily with a meal. 180 tablet 3  . Nebivolol HCl 20 MG TABS Take 1 tablet (20 mg total) by mouth daily. 90 tablet 1  . NOVOLOG FLEXPEN 100 UNIT/ML FlexPen USE 15 TO 30 UNITS 15 MINUTES BEFORE MEALS 3 TIMES DAILY 5 pen 0  . omeprazole (PRILOSEC) 20 MG capsule TAKE 1 CAPSULE (20 MG TOTAL) BY MOUTH 2 (TWO) TIMES DAILY BEFORE A MEAL. 180 capsule 0  . TOUJEO SOLOSTAR 300 UNIT/ML SOPN INJECT 30 UNITS INTO THE SKIN AT BEDTIME. 4.5 mL 1   Current Facility-Administered Medications on File Prior to Visit  Medication Dose Route Frequency Provider Last Rate Last Dose  . pneumococcal 13-valent conjugate vaccine (PREVNAR 13) injection 0.5 mL  0.5 mL Intramuscular Tomorrow-1000 Loany Neuroth, NP        BP 140/70   Temp 97.9 F (36.6 C) (Oral)   Ht 6\' 2"  (1.88 m)   Wt 240 lb 2 oz (108.9 kg)   BMI 30.83 kg/m       Objective:   Physical Exam  Constitutional: He is oriented to person, place, and time. He appears well-developed and well-nourished. No distress.  Cardiovascular: Normal rate, regular rhythm, normal heart sounds and intact distal pulses.  Exam reveals no gallop and no friction rub.   No murmur heard. Pulmonary/Chest: Effort  normal and breath sounds normal. No respiratory distress. He has no wheezes. He has no rales. He exhibits no tenderness.  Neurological: He is alert and oriented to person, place, and time.  Skin: Skin is warm and dry. No rash noted. He is not diaphoretic. No erythema. No  pallor.  Small pencil eraser sized wound on the lateral side of right great toe. There are no signs of infection noted. No drainage, redness or warmth.   Psychiatric: He has a normal mood and affect. His behavior is normal. Judgment and thought content normal.  Nursing note and vitals reviewed.     Assessment & Plan:  1. Open toe wound, sequela - Will treat  due to uncontrolled diabetes.  - doxycycline (VIBRAMYCIN) 100 MG capsule; Take 1 capsule (100 mg total) by mouth 2 (two) times daily.  Dispense: 14 capsule; Refill: 0 - Continue with antibiotic ointment and bandage - Warm soaks BID - Follow up in 7 days or sooner if needed 2. Uncontrolled type 2 diabetes mellitus with diabetic neuropathy, with long-term current use of insulin (HCC) - POC Glucose (CBG)- 102   Shirline Frees, NP

## 2015-11-17 ENCOUNTER — Encounter (HOSPITAL_COMMUNITY): Payer: Self-pay | Admitting: Nurse Practitioner

## 2015-11-17 ENCOUNTER — Ambulatory Visit (HOSPITAL_COMMUNITY)
Admission: RE | Admit: 2015-11-17 | Discharge: 2015-11-17 | Disposition: A | Discharge status: Home / Self Care | Payer: Medicare Other | Source: Ambulatory Visit | Attending: Nurse Practitioner | Admitting: Nurse Practitioner

## 2015-11-17 ENCOUNTER — Ambulatory Visit (HOSPITAL_COMMUNITY): Payer: Medicare Other | Admitting: Nurse Practitioner

## 2015-11-17 VITALS — BP 168/86 | HR 62 | Ht 74.0 in | Wt 236.2 lb

## 2015-11-17 DIAGNOSIS — Z6830 Body mass index (BMI) 30.0-30.9, adult: Secondary | ICD-10-CM | POA: Insufficient documentation

## 2015-11-17 DIAGNOSIS — Z7982 Long term (current) use of aspirin: Secondary | ICD-10-CM | POA: Diagnosis not present

## 2015-11-17 DIAGNOSIS — F329 Major depressive disorder, single episode, unspecified: Secondary | ICD-10-CM | POA: Diagnosis not present

## 2015-11-17 DIAGNOSIS — M1990 Unspecified osteoarthritis, unspecified site: Secondary | ICD-10-CM | POA: Diagnosis not present

## 2015-11-17 DIAGNOSIS — Z7901 Long term (current) use of anticoagulants: Secondary | ICD-10-CM | POA: Diagnosis not present

## 2015-11-17 DIAGNOSIS — I4891 Unspecified atrial fibrillation: Secondary | ICD-10-CM

## 2015-11-17 DIAGNOSIS — G629 Polyneuropathy, unspecified: Secondary | ICD-10-CM | POA: Insufficient documentation

## 2015-11-17 DIAGNOSIS — Z794 Long term (current) use of insulin: Secondary | ICD-10-CM | POA: Insufficient documentation

## 2015-11-17 DIAGNOSIS — E785 Hyperlipidemia, unspecified: Secondary | ICD-10-CM | POA: Diagnosis not present

## 2015-11-17 DIAGNOSIS — G4733 Obstructive sleep apnea (adult) (pediatric): Secondary | ICD-10-CM | POA: Insufficient documentation

## 2015-11-17 DIAGNOSIS — E1142 Type 2 diabetes mellitus with diabetic polyneuropathy: Secondary | ICD-10-CM | POA: Insufficient documentation

## 2015-11-17 DIAGNOSIS — E669 Obesity, unspecified: Secondary | ICD-10-CM | POA: Diagnosis not present

## 2015-11-17 DIAGNOSIS — Z8249 Family history of ischemic heart disease and other diseases of the circulatory system: Secondary | ICD-10-CM | POA: Diagnosis not present

## 2015-11-17 DIAGNOSIS — I10 Essential (primary) hypertension: Secondary | ICD-10-CM | POA: Insufficient documentation

## 2015-11-17 DIAGNOSIS — I480 Paroxysmal atrial fibrillation: Secondary | ICD-10-CM | POA: Diagnosis not present

## 2015-11-17 DIAGNOSIS — Z79899 Other long term (current) drug therapy: Secondary | ICD-10-CM | POA: Insufficient documentation

## 2015-11-17 DIAGNOSIS — Z951 Presence of aortocoronary bypass graft: Secondary | ICD-10-CM | POA: Insufficient documentation

## 2015-11-17 DIAGNOSIS — K219 Gastro-esophageal reflux disease without esophagitis: Secondary | ICD-10-CM | POA: Insufficient documentation

## 2015-11-17 DIAGNOSIS — I2510 Atherosclerotic heart disease of native coronary artery without angina pectoris: Secondary | ICD-10-CM | POA: Insufficient documentation

## 2015-11-17 MED ORDER — APIXABAN 5 MG PO TABS: 5.0000 mg | ORAL_TABLET | Freq: Two times a day (BID) | ORAL | 11 refills | Status: DC

## 2015-11-17 MED ORDER — APIXABAN 5 MG PO TABS: 5.0000 mg | ORAL_TABLET | Freq: Two times a day (BID) | ORAL | 0 refills | Status: DC

## 2015-11-18 NOTE — Progress Notes (Signed)
Primary Care Physician: Shirline Freesory Nafziger, NP Referring Physician: Dr. Antoine PocheHochrein Cardiologist: Dr. Burna Fortsrenshaw   Luis Yu is a 70 y.o. male with a h/o PAF, CAD,s/p cabg, HTN, DM, that is in afib clinic for f/u successful cardioversion. Pt is not aware of any afib since procedure. [facility] with heart attack which was to be managed medically. He went on to complain of exertional dyspnea and fatigue, dx with afib in July. He is currently on rate control meds and apixaban.  BP elevated, rechecked at 160/90, pt states usually not this high. Overall, feels better since cardioversion.He has sleep apnea, using cpap. No alcohol or excessive caffeine, no tobacco.Unable to exercise due to peripheral neuropathy, he has difficulty with walking.   Today, he denies symptoms of palpitations, chest pain, shortness of breath, orthopnea, PND, lower extremity edema, dizziness, presyncope, syncope, or neurologic sequela. The patient is tolerating medications without difficulties and is otherwise without complaint today.   Past Medical History:  Diagnosis Date  . Adjustment disorder with mixed emotional features   . Atrial flutter (HCC)   . CAD (coronary artery disease)   . Depression   . Diabetes mellitus type II, uncontrolled (HCC)   . Diabetic peripheral neuropathy (HCC)   . Erectile dysfunction   . GERD (gastroesophageal reflux disease)   . Herpes zoster   . Hyperlipidemia   . Hypertension   . Obesity   . Osteoarthritis   . Postherpetic neuralgia   . Sleep apnea    Past Surgical History:  Procedure Laterality Date  . CARDIOVERSION N/A 11/10/2015   Procedure: CARDIOVERSION;  Surgeon: Laurey Moralealton S McLean, MD;  [facility];  Service: Cardiovascular;  Laterality: N/A;  . CORONARY ARTERY BYPASS GRAFT  1996   eith a LIMA to the LAD, saphenous vein graft to the acute marginal, saphenous vein graft to the PDA and saphenous vein graft to the circumflex.  [name].  KNEE ARTHROSCOPY  05/2004   right knee  . REPLACEMENT TOTAL KNEE BILATERAL    . SHOULDER SURGERY      Current Outpatient Prescriptions  Medication Sig Dispense Refill  . apixaban (ELIQUIS) 5 MG TABS tablet Take 1 tablet (5 mg total) by mouth 2 (two) times daily. 60 tablet 11  . aspirin EC 81 MG tablet Take 1 tablet (81 mg total) by mouth daily.    [name]. atorvastatin (LIPITOR) 80 MG tablet Take 1 tablet (80 mg total) by mouth daily. 90 tablet 1  . buPROPion (WELLBUTRIN XL) 300 MG 24 hr tablet Take 1 tablet (300 mg total) by mouth daily. 90 tablet 3  . Cyanocobalamin (B-12) 5000 MCG SUBL Place 1 tablet under the tongue once a week.    . doxycycline (VIBRAMYCIN) 100 MG capsule Take 1 capsule (100 mg total) by mouth 2 (two) times daily. 14 capsule 0  . fluticasone (FLONASE) 50 MCG/ACT nasal spray Place 2 sprays into both nostrils daily as needed for allergies.     [name]. gabapentin (NEURONTIN) 300 MG capsule TAKE ONE CAPSULE EVERY MORNING AND TAKE 2 CAPSULES EVERY EVENING (Patient taking differently: TAKE TWO CAPSULE EVERY MORNING, 2 CAPSULES IN THE MIDDLE OF DAY AND TAKE 2 CAPSULES EVERY EVENING) 90 capsule 5  . glipiZIDE (GLUCOTROL) 10 MG tablet Take 1 tablet (10 mg total) by mouth 2 (two) times daily before a meal. 180 tablet 3  . glucose blood (ACCU-CHEK ACTIVE STRIPS) test strip Use as instructed 100 each 12  . Insulin Pen  Needle (PEN NEEDLES 31GX5/16") 31G X 8 MM MISC Use to inject insulin 5 times a day. 100 each 11  . isosorbide mononitrate (IMDUR) 30 MG 24 hr tablet Take 1 tablet (30 mg total) by mouth daily. 30 tablet 12  . Lancets (ACCU-CHEK MULTICLIX) lancets Use up to 5 times a day to check blood sugar. 100 each 5  . lisinopril-hydrochlorothiazide (PRINZIDE,ZESTORETIC) 20-12.5 MG tablet TAKE 1 TABLET BY MOUTH EVERY DAY 90 tablet 1  . metFORMIN (GLUCOPHAGE) 1000 MG tablet Take 1 tablet (1,000 mg total) by mouth 2 (two) times daily with a meal. 180 tablet 3  . Nebivolol HCl 20 MG TABS Take 1  tablet (20 mg total) by mouth daily. 90 tablet 1  . NOVOLOG FLEXPEN 100 UNIT/ML FlexPen USE 15 TO 30 UNITS 15 MINUTES BEFORE MEALS 3 TIMES DAILY 5 pen 0  . omeprazole (PRILOSEC) 20 MG capsule TAKE 1 CAPSULE (20 MG TOTAL) BY MOUTH 2 (TWO) TIMES DAILY BEFORE A MEAL. 180 capsule 0  . TOUJEO SOLOSTAR 300 UNIT/ML SOPN INJECT 30 UNITS INTO THE SKIN AT BEDTIME. 4.5 mL 1   Current Facility-Administered Medications  Medication Dose Route Frequency Provider Last Rate Last Dose  . pneumococcal 13-valent conjugate vaccine (PREVNAR 13) injection 0.5 mL  0.5 mL Intramuscular Tomorrow-1000 Shirline Frees, NP        No Known Allergies  Social History   Social History  . Marital status: Married    Spouse name: [name]  . Number of children: 1  . Years of education: 53   Occupational History  . Retired Naval architect Retired   Social History Main Topics  . Smoking status: Never Smoker  . Smokeless tobacco: Never Used  . Alcohol use 1.2 oz/week    2 Standard drinks or equivalent per week     Comment: twice a month.  . Drug use: No  . Sexual activity: Yes    Partners: Female   Other Topics Concern  . Not on file   Social History Narrative   Married [name])   Regular exercise: none   Caffeine use: cup of coffee daily    Retired Naval architect   Caffeine- twice daily             Family History  Problem Relation Age of Onset  . Coronary artery disease Mother     deceased at 89  . Coronary artery disease Father     died age 53  . Stroke      half brother  . Stroke Brother     ROS- All systems are reviewed and negative except as per the HPI above  Physical Exam: Vitals:   11/17/15 1427  BP: (!) 168/86  Pulse: 62  Weight: 236 lb 3.2 oz (107.1 kg)  Height: 6\' 2"  (1.88 m)    GEN- The patient is well appearing, alert and oriented x 3 today.   Head- normocephalic, atraumatic Eyes-  Sclera clear, conjunctiva pink Ears- hearing intact Oropharynx- clear Neck- supple, no  JVP Lymph- no cervical lymphadenopathy Lungs- Clear to ausculation bilaterally, normal work of breathing Heart- Regular rate and rhythm, no murmurs, rubs or gallops, PMI not laterally displaced GI- soft, NT, ND, + BS Extremities- no clubbing, cyanosis, or edema MS- no significant deformity or atrophy Skin- no rash or lesion Psych- euthymic mood, full affect Neuro- strength and sensation are intact  EKG-NSR at 62 bpm, Pr int 204 ms, Qrs int 90 ms, qtc 403 ms Epic records reviewed  Assessment and Plan: 1.  PAF Successful cardioversion Currently in SR Continue bystolic Continue apixaban with chadsvasc score of at least 4.  2. Lifestyle triggers for afib reviewed Would be beneficial for pt to exercise and lose weight but he is sedentary due to peripheral neuropathy No excessive alcohol No excessive caffeine No current tobacco  Treat OSA with cpap General education re afib discussed, written info given  3. CAD Medically treated Continue asa Per cardiology  4.HTN Elevated today Avoid salt Antihypertensives  F/u Azalee Course, PA, 9/11 afib clinic as needed  Elvina Sidle. [facility] [address] [place], [place] [address] [phone]

## 2015-11-22 ENCOUNTER — Ambulatory Visit (INDEPENDENT_AMBULATORY_CARE_PROVIDER_SITE_OTHER): Payer: Medicare Other | Admitting: Adult Health

## 2015-11-22 ENCOUNTER — Encounter: Payer: Self-pay | Admitting: Adult Health

## 2015-11-22 VITALS — BP 104/58 | Temp 98.2°F | Ht 74.0 in | Wt 231.6 lb

## 2015-11-22 DIAGNOSIS — I25119 Atherosclerotic heart disease of native coronary artery with unspecified angina pectoris: Secondary | ICD-10-CM | POA: Diagnosis not present

## 2015-11-22 DIAGNOSIS — Z23 Encounter for immunization: Secondary | ICD-10-CM

## 2015-11-22 DIAGNOSIS — Z5189 Encounter for other specified aftercare: Secondary | ICD-10-CM

## 2015-11-22 MED ORDER — BUPROPION HCL ER (SR) 150 MG PO TB12: 150.0000 mg | ORAL_TABLET | Freq: Two times a day (BID) | ORAL | 3 refills | Status: DC

## 2015-11-22 NOTE — Progress Notes (Signed)
Subjective:    Patient ID: Luis Yu, male    DOB: [birth year], 70 y.o.   MRN: [id number]  HPI  70 year old male who presents to the office today for follow up for wound to right great toe. I last saw him a week ago and prescribed a course of doxycycline due to his history of uncontrolled diabetes. He had finished the doxycycline today. He reports no issues with this medication.   Today in the office his wound has almost healed completely. There is no signs of infection.    Review of Systems  Respiratory: Positive for shortness of breath (chronic ).   Cardiovascular: Negative.   Gastrointestinal: Negative.   Genitourinary: Negative.   Neurological: Negative.   All other systems reviewed and are negative.  Past Medical History:  Diagnosis Date  . Adjustment disorder with mixed emotional features   . Atrial flutter (HCC)   . CAD (coronary artery disease)   . Depression   . Diabetes mellitus type II, uncontrolled (HCC)   . Diabetic peripheral neuropathy (HCC)   . Erectile dysfunction   . GERD (gastroesophageal reflux disease)   . Herpes zoster   . Hyperlipidemia   . Hypertension   . Obesity   . Osteoarthritis   . Postherpetic neuralgia   . Sleep apnea     Social History   Social History  . Marital status: Married    Spouse name: [name]  . Number of children: 1  . Years of education: 29   Occupational History  . Retired Naval architect Retired   Social History Main Topics  . Smoking status: Never Smoker  . Smokeless tobacco: Never Used  . Alcohol use 1.2 oz/week    2 Standard drinks or equivalent per week     Comment: twice a month.  . Drug use: No  . Sexual activity: Yes    Partners: Female   Other Topics Concern  . Not on file   Social History Narrative   Married [name])   Regular exercise: none   Caffeine use: cup of coffee daily    Retired Naval architect   Caffeine- twice daily             Past Surgical History:  Procedure Laterality Date  .  CARDIOVERSION N/A 11/10/2015   Procedure: CARDIOVERSION;  Surgeon: Laurey Morale, MD;  Location: [facility] ENDOSCOPY;  Service: Cardiovascular;  Laterality: N/A;  . CORONARY ARTERY BYPASS GRAFT  1996   eith a LIMA to the LAD, saphenous vein graft to the acute marginal, saphenous vein graft to the PDA and saphenous vein graft to the circumflex.  [name] KNEE ARTHROSCOPY  05/2004   right knee  . REPLACEMENT TOTAL KNEE BILATERAL    . SHOULDER SURGERY      Family History  Problem Relation Age of Onset  . Coronary artery disease Mother     deceased at 67  . Coronary artery disease Father     died age 1  . Stroke      half brother  . Stroke Brother     No Known Allergies  Current Outpatient Prescriptions on File Prior to Visit  Medication Sig Dispense Refill  . apixaban (ELIQUIS) 5 MG TABS tablet Take 1 tablet (5 mg total) by mouth 2 (two) times daily. 60 tablet 11  . aspirin EC 81 MG tablet Take 1 tablet (81 mg total) by mouth daily.    [name] atorvastatin (LIPITOR) 80 MG tablet Take 1 tablet (80 mg total)  by mouth daily. 90 tablet 1  . buPROPion (WELLBUTRIN XL) 300 MG 24 hr tablet Take 1 tablet (300 mg total) by mouth daily. 90 tablet 3  . Cyanocobalamin (B-12) 5000 MCG SUBL Place 1 tablet under the tongue once a week.    . fluticasone (FLONASE) 50 MCG/ACT nasal spray Place 2 sprays into both nostrils daily as needed for allergies.     [name] gabapentin (NEURONTIN) 300 MG capsule TAKE ONE CAPSULE EVERY MORNING AND TAKE 2 CAPSULES EVERY EVENING (Patient taking differently: TAKE TWO CAPSULE EVERY MORNING, 2 CAPSULES IN THE MIDDLE OF DAY AND TAKE 2 CAPSULES EVERY EVENING) 90 capsule 5  . glipiZIDE (GLUCOTROL) 10 MG tablet Take 1 tablet (10 mg total) by mouth 2 (two) times daily before a meal. 180 tablet 3  . glucose blood (ACCU-CHEK ACTIVE STRIPS) test strip Use as instructed 100 each 12  . Insulin Pen Needle (PEN NEEDLES 31GX5/16") 31G X 8 MM MISC Use to inject insulin 5 times a day. 100 each 11  . isosorbide  mononitrate (IMDUR) 30 MG 24 hr tablet Take 1 tablet (30 mg total) by mouth daily. 30 tablet 12  . Lancets (ACCU-CHEK MULTICLIX) lancets Use up to 5 times a day to check blood sugar. 100 each 5  . lisinopril-hydrochlorothiazide (PRINZIDE,ZESTORETIC) 20-12.5 MG tablet TAKE 1 TABLET BY MOUTH EVERY DAY 90 tablet 1  . metFORMIN (GLUCOPHAGE) 1000 MG tablet Take 1 tablet (1,000 mg total) by mouth 2 (two) times daily with a meal. 180 tablet 3  . Nebivolol HCl 20 MG TABS Take 1 tablet (20 mg total) by mouth daily. 90 tablet 1  . NOVOLOG FLEXPEN 100 UNIT/ML FlexPen USE 15 TO 30 UNITS 15 MINUTES BEFORE MEALS 3 TIMES DAILY 5 pen 0  . omeprazole (PRILOSEC) 20 MG capsule TAKE 1 CAPSULE (20 MG TOTAL) BY MOUTH 2 (TWO) TIMES DAILY BEFORE A MEAL. 180 capsule 0  . TOUJEO SOLOSTAR 300 UNIT/ML SOPN INJECT 30 UNITS INTO THE SKIN AT BEDTIME. 4.5 mL 1   Current Facility-Administered Medications on File Prior to Visit  Medication Dose Route Frequency Provider Last Rate Last Dose  . pneumococcal 13-valent conjugate vaccine (PREVNAR 13) injection 0.5 mL  0.5 mL Intramuscular Tomorrow-1000 Avry Monteleone, NP        BP (!) 104/58 (BP Location: Left Arm, Patient Position: Sitting, Cuff Size: Large)   Temp 98.2 F (36.8 C) (Oral)   Ht 6\' 2"  (1.88 m)   Wt 231 lb 9.6 oz (105.1 kg)   BMI 29.74 kg/m       Objective:   Physical Exam  Constitutional: He is oriented to person, place, and time. He appears well-developed and well-nourished. No distress.  HENT:  Head: Normocephalic and atraumatic.  Right Ear: External ear normal.  Left Ear: External ear normal.  Nose: Nose normal.  Mouth/Throat: Oropharynx is clear and moist. No oropharyngeal exudate.  Cardiovascular: Normal rate, regular rhythm, normal heart sounds and intact distal pulses.  Exam reveals no gallop and no friction rub.   No murmur heard. Pulmonary/Chest: Effort normal and breath sounds normal. No respiratory distress. He has no wheezes. He has no rales.  He exhibits no tenderness.  Abdominal: Soft. Bowel sounds are normal. He exhibits no distension and no mass. There is no tenderness. There is no rebound and no guarding.  Musculoskeletal: Normal range of motion. He exhibits no edema, tenderness or deformity.  Neurological: He is alert and oriented to person, place, and time.  Skin: Skin is warm and dry. No  rash noted. He is not diaphoretic. No erythema. No pallor.  Psychiatric: He has a normal mood and affect. His behavior is normal. Judgment and thought content normal.  Vitals reviewed.     Assessment & Plan:  1. Visit for wound care - Wound appears to be healing well -Continue to apply bandage until fully healed - Wear diabetic shoes  2. Need for prophylactic vaccination and inoculation against influenza - Flu vaccine HIGH DOSE PF  Shirline Freesory Ferguson Gertner, NP

## 2015-11-22 NOTE — Progress Notes (Signed)
Pre visit review using our clinic review tool, if applicable. No additional management support is needed unless otherwise documented below in the visit note. 

## 2015-11-22 NOTE — Patient Instructions (Signed)
It was great seeing you today!  Your foot looks great!   Continue to work on losing weight. Start doing more activities, but take it slow.   Follow up with me as needed

## 2015-11-25 ENCOUNTER — Encounter: Payer: Self-pay | Admitting: *Deleted

## 2015-11-28 ENCOUNTER — Ambulatory Visit (INDEPENDENT_AMBULATORY_CARE_PROVIDER_SITE_OTHER): Payer: Medicare Other | Admitting: Physician Assistant

## 2015-11-28 ENCOUNTER — Encounter: Payer: Self-pay | Admitting: Physician Assistant

## 2015-11-28 ENCOUNTER — Telehealth: Payer: Self-pay | Admitting: *Deleted

## 2015-11-28 VITALS — BP 130/76 | HR 63 | Ht 74.0 in | Wt 233.0 lb

## 2015-11-28 DIAGNOSIS — I10 Essential (primary) hypertension: Secondary | ICD-10-CM

## 2015-11-28 DIAGNOSIS — Z9989 Dependence on other enabling machines and devices: Secondary | ICD-10-CM

## 2015-11-28 DIAGNOSIS — I25810 Atherosclerosis of coronary artery bypass graft(s) without angina pectoris: Secondary | ICD-10-CM

## 2015-11-28 DIAGNOSIS — E119 Type 2 diabetes mellitus without complications: Secondary | ICD-10-CM | POA: Diagnosis not present

## 2015-11-28 DIAGNOSIS — G4733 Obstructive sleep apnea (adult) (pediatric): Secondary | ICD-10-CM | POA: Diagnosis not present

## 2015-11-28 DIAGNOSIS — IMO0001 Reserved for inherently not codable concepts without codable children: Secondary | ICD-10-CM

## 2015-11-28 DIAGNOSIS — I4892 Unspecified atrial flutter: Secondary | ICD-10-CM

## 2015-11-28 DIAGNOSIS — Z794 Long term (current) use of insulin: Secondary | ICD-10-CM

## 2015-11-28 DIAGNOSIS — E785 Hyperlipidemia, unspecified: Secondary | ICD-10-CM

## 2015-11-28 NOTE — Progress Notes (Signed)
Cardiology Office Note    Date:  11/28/2015   ID:  Luis Yu, DOB 08/24/1945, MRN [id number]  PCP:  Shirline Frees, NP  Cardiologist:  Dr. Jens Som  Chief Complaint  Patient presents with  . Follow-up    seen for Dr. Jens Som    History of Present Illness:  Luis Yu is a 70 y.o. male with PMH of atrial fibrillation/atrial flutter, DM II, OSA on CPAP, GERD, hypertension, hyperlipidemia and CAD s/p CABG 1996. She had CABG in 1996 with LIMA to LAD, SVG to acute marginal, SVG to PDA, SVG to left circumflex. Her last cardiac cath in May 2011 revealed triple vessel CAD with 3 out of 3 patent grafts, he did have severe stenosis in proximal and distal body of SVG to left sided PDA. He underwent PCI with drug-eluting stent. He had a repeat cath in December 2011 dyspnea and abnormal Myoview which revealed EF 50%, continued patency of the LIMA to LAD, occluded SVG to distal left circumflex/PDA territory and progressive disease in SVG to OM. He was evaluated by CVTS. Distal vessels were felt to be poor targets, it was eventually decided CABG could be considered in the future if PCI option were exhausted. Patient eventually underwent PCI of the SVG to obtuse marginal.  Patient was admitted to [facility] in [facility] in May 2017 for chest pain, no record was available, he was ruled in for MI with abnormal enzymes. However patient reports no cath was performed and he was treated medically. Near the end of June to early July of 2017, he did report to urgent care for dyspnea and wheezing. It was felt he had bronchitis. An EKG obtained showed new atrial fibrillation with controlled ventricular rate. He was seen in the office on 10/06/2015, he was started on eliquis 5 mg twice a day for stroke prophylaxis. He was continued on Bystolic for rate control. Imdur was increased to 30 mg daily for antianginal purposes. Plavix was stopped. A myocardial perfusion imaging was obtained on 10/20/2015 which showed EF  48%, no significant ST deviation, normal resting and perfusion image, overall low risk study. Echocardiogram obtained on 11/01/2015 showed EF 55-60%, mild LVH, no regional wall motion abnormality, moderately dilated biatrial chamber. He was later seen by Dr. Antoine Poche who recommended a 24-hour Holter monitor on 11/08/2015 which showed persistent atrial flutter with variable rate, premature ectopic complex. He was still having dyspnea which was suspected to be related to atrial fibrillation. He eventually was set up for outpatient cardioversion on 11/10/2015, at which time he underwent successful DC cardioversion after 200 J. He has been maintaining sinus rhythm since as evident by repeat EKG on 11/17/2015 during his visit to the A. fib clinic. During his A. fib clinic visits, his blood pressure was noted to be elevated in the 160s, however patient is no longer symptomatic after cardioversion.  He presents today for cardiology office visit, he has been feeling very well since the cardioversion. His blood pressure has been significantly improved. However according to both patient and his wife, they're currently in insurance donut hole and unable to afford this eliquis and Bystolic, we have given them samples for now. Otherwise, he no longer have the same palpitation, shortness breath and fatigue he had before the cardioversion. We did not obtain EKG today, however he is in sinus rhythm based on physical exam. I think he is doing very well from cardiology perspective. He will see Dr. Jens Som on follow-up in 3 month. He was  recently seen again by PCP for right toe wound, he was treated with a course of doxycycline. On physical exam, his wound is well-healed, mild redness, however no significant pain.    Past Medical History:  Diagnosis Date  . Adjustment disorder with mixed emotional features   . Atrial flutter (HCC)   . CAD (coronary artery disease)   . Depression   . Diabetes mellitus type II, uncontrolled  (HCC)   . Diabetic peripheral neuropathy (HCC)   . Erectile dysfunction   . GERD (gastroesophageal reflux disease)   . Herpes zoster   . Hyperlipidemia   . Hypertension   . Obesity   . Osteoarthritis   . Postherpetic neuralgia   . Sleep apnea     Past Surgical History:  Procedure Laterality Date  . CARDIOVERSION N/A 11/10/2015   Procedure: CARDIOVERSION;  Surgeon: Laurey Moralealton S McLean, MD;  Location[facility] ENDOSCOPY;  Service: Cardiovascular;  Laterality: N/A;  . CORONARY ARTERY BYPASS GRAFT  1996   eith a LIMA to the LAD, saphenous vein graft to the acute marginal, saphenous vein graft to the PDA and saphenous vein graft to the circumflex.  [name]. KNEE ARTHROSCOPY  05/2004   right knee  . REPLACEMENT TOTAL KNEE BILATERAL    . SHOULDER SURGERY      Current Medications: Outpatient Medications Prior to Visit  Medication Sig Dispense Refill  . apixaban (ELIQUIS) 5 MG TABS tablet Take 1 tablet (5 mg total) by mouth 2 (two) times daily. 60 tablet 11  . aspirin EC 81 MG tablet Take 1 tablet (81 mg total) by mouth daily.    [name]. atorvastatin (LIPITOR) 80 MG tablet Take 1 tablet (80 mg total) by mouth daily. 90 tablet 1  . Cyanocobalamin (B-12) 5000 MCG SUBL Place 1 tablet under the tongue once a week.    . fluticasone (FLONASE) 50 MCG/ACT nasal spray Place 2 sprays into both nostrils daily as needed for allergies.     [name]. glipiZIDE (GLUCOTROL) 10 MG tablet Take 1 tablet (10 mg total) by mouth 2 (two) times daily before a meal. 180 tablet 3  . glucose blood (ACCU-CHEK ACTIVE STRIPS) test strip Use as instructed 100 each 12  . Insulin Pen Needle (PEN NEEDLES 31GX5/16") 31G X 8 MM MISC Use to inject insulin 5 times a day. 100 each 11  . isosorbide mononitrate (IMDUR) 30 MG 24 hr tablet Take 1 tablet (30 mg total) by mouth daily. 30 tablet 12  . Lancets (ACCU-CHEK MULTICLIX) lancets Use up to 5 times a day to check blood sugar. 100 each 5  . lisinopril-hydrochlorothiazide (PRINZIDE,ZESTORETIC) 20-12.5 MG  tablet TAKE 1 TABLET BY MOUTH EVERY DAY 90 tablet 1  . metFORMIN (GLUCOPHAGE) 1000 MG tablet Take 1 tablet (1,000 mg total) by mouth 2 (two) times daily with a meal. 180 tablet 3  . Nebivolol HCl 20 MG TABS Take 1 tablet (20 mg total) by mouth daily. 90 tablet 1  . NOVOLOG FLEXPEN 100 UNIT/ML FlexPen USE 15 TO 30 UNITS 15 MINUTES BEFORE MEALS 3 TIMES DAILY 5 pen 0  . omeprazole (PRILOSEC) 20 MG capsule TAKE 1 CAPSULE (20 MG TOTAL) BY MOUTH 2 (TWO) TIMES DAILY BEFORE A MEAL. 180 capsule 0  . TOUJEO SOLOSTAR 300 UNIT/ML SOPN INJECT 30 UNITS INTO THE SKIN AT BEDTIME. 4.5 mL 1  . gabapentin (NEURONTIN) 300 MG capsule TAKE ONE CAPSULE EVERY MORNING AND TAKE 2 CAPSULES EVERY EVENING (Patient not taking: Reported on 11/28/2015) 90 capsule 5   Facility-Administered Medications Prior  to Visit  Medication Dose Route Frequency Provider Last Rate Last Dose  . pneumococcal 13-valent conjugate vaccine (PREVNAR 13) injection 0.5 mL  0.5 mL Intramuscular Tomorrow-1000 Shirline Frees, NP         Allergies:   Review of patient's allergies indicates no known allergies.   Social History   Social History  . Marital status: Married    Spouse name: [name]  . Number of children: 1  . Years of education: 39   Occupational History  . Retired Naval architect Retired   Social History Main Topics  . Smoking status: Never Smoker  . Smokeless tobacco: Never Used  . Alcohol use 1.2 oz/week    2 Standard drinks or equivalent per week     Comment: twice a month.  . Drug use: No  . Sexual activity: Yes    Partners: Female   Other Topics Concern  . None   Social History Narrative   Married Counselling psychologist)   Regular exercise: none   Caffeine use: cup of coffee daily    Retired Naval architect   Caffeine- twice daily              Family History:  The patient's family history includes Coronary artery disease in his father and mother; Stroke in his brother.   ROS:   Please see the history of present illness.    ROS  All other systems reviewed and are negative.   PHYSICAL EXAM:   VS:  BP 130/76   Pulse 63   Ht 6\' 2"  (1.88 m)   Wt 233 lb (105.7 kg)   BMI 29.92 kg/m    GEN: Well nourished, well developed, in no acute distress  HEENT: normal  Neck: no JVD, carotid bruits, or masses Cardiac: RRR; no murmurs, rubs, or gallops,no edema  Respiratory:  clear to auscultation bilaterally, normal work of breathing GI: soft, nontender, nondistended, + BS MS: no deformity or atrophy  Skin: warm and dry, no rash Neuro:  Alert and Oriented x 3, Strength and sensation are intact Psych: euthymic mood, full affect  Wt Readings from Last 3 Encounters:  11/28/15 233 lb (105.7 kg)  11/22/15 231 lb 9.6 oz (105.1 kg)  11/17/15 236 lb 3.2 oz (107.1 kg)      Studies/Labs Reviewed:   EKG:  EKG is not ordered today.   Recent Labs: 05/13/2015: ALT 16 10/14/2015: Hemoglobin 12.9; Platelets 142; TSH 1.15 10/31/2015: BUN 20; Creat 1.21; Potassium 4.3; Sodium 143 11/01/2015: Brain Natriuretic Peptide 195.2   Lipid Panel    Component Value Date/Time   CHOL 142 05/13/2015 1708   TRIG 77 05/13/2015 1708   HDL 54 05/13/2015 1708   CHOLHDL 2.6 05/13/2015 1708   VLDL 15 05/13/2015 1708   LDLCALC 73 05/13/2015 1708    Additional studies/ records that were reviewed today include:    Myoview 10/20/2015 Study Highlights     Nuclear stress EF: 48%.  The left ventricular ejection fraction is mildly decreased (45-54%).  There was no ST segment deviation noted during stress.  This is a low risk study.   Normal resting and stress perfusion. No ischemia or infarction EF 48% but may not be accurate due to afib      Echo 11/01/2015 LV EF: 55% -   60%  ------------------------------------------------------------------- Indications:      (I48.91).  ------------------------------------------------------------------- History:   PMH:  Acquired from the patient and from the patient&'s chart.  Atrial  fibrillation.  Coronary artery disease.  Risk factors:  Hypertension. Diabetes mellitus. Dyslipidemia.  ------------------------------------------------------------------- Study Conclusions  - Left ventricle: The cavity size was normal. Wall thickness was   increased in a pattern of mild LVH. Systolic function was normal.   The estimated ejection fraction was in the range of 55% to 60%.   Wall motion was normal; there were no regional wall motion   abnormalities. - Left atrium: The atrium was moderately dilated. - Right atrium: The atrium was moderately dilated.   24 Hour Holter Monitor 11/08/2015 Persistent atrial flutter with variable rate. Premature ectopic complexes.  Of note there is conduction with aberrancy when his rate is faster.   ASSESSMENT:    1. Atrial flutter, unspecified type (HCC)   2. OSA on CPAP   3. IDDM (insulin dependent diabetes mellitus) (HCC)   4. Essential hypertension   5. Hyperlipidemia   6. Coronary artery disease involving coronary bypass graft of native heart without angina pectoris      PLAN:  In order of problems listed above:  1. Paroxysmal atrial flutter on Eliquis  - continue on Bystolic and eliquis.   - This patients CHA2DS2-VASc Score and unadjusted Ischemic Stroke Rate (% per year) is equal to 4.8 % stroke rate/year from a score of 4  Above score calculated as 1 point each if present [CHF, HTN, DM, Vascular=MI/PAD/Aortic Plaque, Age if 65-74, or Male] Above score calculated as 2 points each if present [Age > 75, or Stroke/TIA/TE]   2. CAD s/p CABG: Denies any chest discomfort. Recent Myoview was negative for ischemia, however EF borderline low, follow-up echocardiogram however showed EF is actually normal.  3. HTN: BP mildly elevated, however significantly improved compared to the last visit, continue on Imdur 30 mg daily, lisinopril/hydrochlorothiazide, Bystolic, may consider increasing Imdur to 60 mg  4. HLD: Continue Lipitor  80 mg daily. Due for repeat 04/2016  5. IDDM: Managed by PCP  6. OSA on CPAP: Has been compliant at home.    Medication Adjustments/Labs and Tests Ordered: Current medicines are reviewed at length with the patient today.  Concerns regarding medicines are outlined above.  Medication changes, Labs and Tests ordered today are listed in the Patient Instructions below. Patient Instructions  Medication Instructions: Your physician recommends that you continue on your current medications as directed. Please refer to the Current Medication list given to you today.  Labwork: None ordered   Testing/Procedures: None ordered  Follow-Up: Your physician recommends that you schedule a follow-up appointment in: 3 MONTHS WITH DR CRENSHAW ONLY  Any Other Special Instructions Will Be Listed Below (If Applicable).  NEXT APPT:  DATE____________________________________________  TIME:___________________________________AM/PM   If you need a refill on your cardiac medications before your next appointment, please call your pharmacy.     Ramond Dial, [place]  11/28/2015 3:24 PM    [facility] [address] [place], [place], [place]  [address] Phone: [phone]; Fax: [phone]

## 2015-11-28 NOTE — Telephone Encounter (Signed)
Eliquis samples placed at the front desk per call from NL office.

## 2015-11-28 NOTE — Patient Instructions (Signed)
Medication Instructions: Your physician recommends that you continue on your current medications as directed. Please refer to the Current Medication list given to you today.  Labwork: None ordered   Testing/Procedures: None ordered  Follow-Up: Your physician recommends that you schedule a follow-up appointment in: 3 MONTHS WITH DR CRENSHAW ONLY  Any Other Special Instructions Will Be Listed Below (If Applicable).  NEXT APPT:  DATE____________________________________________  TIME:___________________________________AM/PM   If you need a refill on your cardiac medications before your next appointment, please call your pharmacy.

## 2015-11-30 ENCOUNTER — Encounter: Payer: Self-pay | Admitting: Adult Health

## 2015-11-30 ENCOUNTER — Ambulatory Visit (INDEPENDENT_AMBULATORY_CARE_PROVIDER_SITE_OTHER): Payer: Medicare Other | Admitting: Adult Health

## 2015-11-30 VITALS — BP 142/80 | Temp 98.1°F | Ht 74.0 in | Wt 234.1 lb

## 2015-11-30 DIAGNOSIS — I25810 Atherosclerosis of coronary artery bypass graft(s) without angina pectoris: Secondary | ICD-10-CM | POA: Diagnosis not present

## 2015-11-30 DIAGNOSIS — H6122 Impacted cerumen, left ear: Secondary | ICD-10-CM | POA: Diagnosis not present

## 2015-11-30 NOTE — Progress Notes (Signed)
   Subjective:    Patient ID: Luis Yu, male    DOB: [birth year], 70 y.o.   MRN: [id number]  HPI  [age] year old male who presents to the office today to have his ears cleaned out. He reports that he has slight hearing loss in his left ear. Denies any drainage to ringing in his ears.   He has no other complaints.   Review of Systems  Constitutional: Negative.   HENT: Positive for hearing loss. Negative for ear discharge, ear pain, postnasal drip, rhinorrhea, sinus pressure and tinnitus.   Respiratory: Negative.   Cardiovascular: Negative.   All other systems reviewed and are negative.      Objective:   Physical Exam  Constitutional: He is oriented to person, place, and time. He appears well-developed and well-nourished. No distress.  HENT:  Head: Normocephalic and atraumatic.  Right Ear: External ear normal.  Left Ear: External ear normal.  Nose: Nose normal.  Mouth/Throat: Oropharynx is clear and moist. No oropharyngeal exudate.  Cerumen impaction in left ear   Cardiovascular: Normal rate, regular rhythm, normal heart sounds and intact distal pulses.  Exam reveals no gallop and no friction rub.   No murmur heard. Pulmonary/Chest: Effort normal and breath sounds normal. No respiratory distress. He has no wheezes. He has no rales. He exhibits no tenderness.  Neurological: He is alert and oriented to person, place, and time.  Skin: Skin is warm and dry. No rash noted. He is not diaphoretic. No erythema. No pallor.  Psychiatric: He has a normal mood and affect. His behavior is normal. Judgment and thought content normal.  Nursing note and vitals reviewed.     Assessment & Plan:  1. Cerumen impaction, left - Cerumen impaction was easily removed with warm water irrigation  - Pt endorsed being able to hear better after the procedure.  - No signs of infection noted - Can use OTC ear drops - Follow up as needed  Shirline Freesory Araf Clugston, NP

## 2015-12-07 ENCOUNTER — Emergency Department (HOSPITAL_COMMUNITY): Payer: Medicare Other

## 2015-12-07 ENCOUNTER — Ambulatory Visit (INDEPENDENT_AMBULATORY_CARE_PROVIDER_SITE_OTHER): Payer: Medicare Other | Admitting: Adult Health

## 2015-12-07 ENCOUNTER — Encounter (HOSPITAL_COMMUNITY): Payer: Self-pay

## 2015-12-07 ENCOUNTER — Emergency Department (HOSPITAL_COMMUNITY)
Admission: EM | Admit: 2015-12-07 | Discharge: 2015-12-07 | Disposition: A | Discharge status: Home / Self Care | Payer: Medicare Other | Attending: Emergency Medicine | Admitting: Emergency Medicine

## 2015-12-07 ENCOUNTER — Encounter: Payer: Self-pay | Admitting: Adult Health

## 2015-12-07 VITALS — BP 148/52 | HR 63 | Ht 74.0 in | Wt 234.8 lb

## 2015-12-07 DIAGNOSIS — Y92096 Garden or yard of other non-institutional residence as the place of occurrence of the external cause: Secondary | ICD-10-CM | POA: Diagnosis not present

## 2015-12-07 DIAGNOSIS — R1011 Right upper quadrant pain: Secondary | ICD-10-CM | POA: Insufficient documentation

## 2015-12-07 DIAGNOSIS — Y9301 Activity, walking, marching and hiking: Secondary | ICD-10-CM | POA: Diagnosis not present

## 2015-12-07 DIAGNOSIS — S2232XA Fracture of one rib, left side, initial encounter for closed fracture: Secondary | ICD-10-CM | POA: Diagnosis not present

## 2015-12-07 DIAGNOSIS — S0990XA Unspecified injury of head, initial encounter: Secondary | ICD-10-CM | POA: Diagnosis not present

## 2015-12-07 DIAGNOSIS — W19XXXA Unspecified fall, initial encounter: Secondary | ICD-10-CM

## 2015-12-07 DIAGNOSIS — Z79899 Other long term (current) drug therapy: Secondary | ICD-10-CM | POA: Diagnosis not present

## 2015-12-07 DIAGNOSIS — Z951 Presence of aortocoronary bypass graft: Secondary | ICD-10-CM | POA: Diagnosis not present

## 2015-12-07 DIAGNOSIS — Z96653 Presence of artificial knee joint, bilateral: Secondary | ICD-10-CM | POA: Insufficient documentation

## 2015-12-07 DIAGNOSIS — Z7982 Long term (current) use of aspirin: Secondary | ICD-10-CM | POA: Insufficient documentation

## 2015-12-07 DIAGNOSIS — Z794 Long term (current) use of insulin: Secondary | ICD-10-CM | POA: Insufficient documentation

## 2015-12-07 DIAGNOSIS — S8991XA Unspecified injury of right lower leg, initial encounter: Secondary | ICD-10-CM | POA: Diagnosis not present

## 2015-12-07 DIAGNOSIS — W1839XA Other fall on same level, initial encounter: Secondary | ICD-10-CM | POA: Diagnosis not present

## 2015-12-07 DIAGNOSIS — Y999 Unspecified external cause status: Secondary | ICD-10-CM | POA: Insufficient documentation

## 2015-12-07 DIAGNOSIS — Z7984 Long term (current) use of oral hypoglycemic drugs: Secondary | ICD-10-CM | POA: Insufficient documentation

## 2015-12-07 DIAGNOSIS — I25810 Atherosclerosis of coronary artery bypass graft(s) without angina pectoris: Secondary | ICD-10-CM

## 2015-12-07 DIAGNOSIS — E1140 Type 2 diabetes mellitus with diabetic neuropathy, unspecified: Secondary | ICD-10-CM | POA: Insufficient documentation

## 2015-12-07 DIAGNOSIS — R0781 Pleurodynia: Secondary | ICD-10-CM | POA: Diagnosis not present

## 2015-12-07 DIAGNOSIS — I2510 Atherosclerotic heart disease of native coronary artery without angina pectoris: Secondary | ICD-10-CM | POA: Diagnosis not present

## 2015-12-07 DIAGNOSIS — S2231XA Fracture of one rib, right side, initial encounter for closed fracture: Secondary | ICD-10-CM | POA: Diagnosis not present

## 2015-12-07 DIAGNOSIS — M25561 Pain in right knee: Secondary | ICD-10-CM | POA: Diagnosis not present

## 2015-12-07 DIAGNOSIS — S299XXA Unspecified injury of thorax, initial encounter: Secondary | ICD-10-CM | POA: Diagnosis present

## 2015-12-07 DIAGNOSIS — S20211A Contusion of right front wall of thorax, initial encounter: Secondary | ICD-10-CM | POA: Diagnosis not present

## 2015-12-07 DIAGNOSIS — Z7901 Long term (current) use of anticoagulants: Secondary | ICD-10-CM | POA: Diagnosis not present

## 2015-12-07 DIAGNOSIS — I10 Essential (primary) hypertension: Secondary | ICD-10-CM | POA: Insufficient documentation

## 2015-12-07 LAB — URINALYSIS, ROUTINE W REFLEX MICROSCOPIC
Glucose, UA: 250 mg/dL — AB
Hgb urine dipstick: NEGATIVE
Ketones, ur: 15 mg/dL — AB
Leukocytes, UA: NEGATIVE
Nitrite: NEGATIVE
Protein, ur: NEGATIVE mg/dL
Specific Gravity, Urine: 1.019 (ref 1.005–1.030)
pH: 5.5 (ref 5.0–8.0)

## 2015-12-07 LAB — BASIC METABOLIC PANEL
Anion gap: 7 (ref 5–15)
BUN: 17 mg/dL (ref 6–20)
CO2: 28 mmol/L (ref 22–32)
Calcium: 9.1 mg/dL (ref 8.9–10.3)
Chloride: 104 mmol/L (ref 101–111)
Creatinine, Ser: 1.37 mg/dL — ABNORMAL HIGH (ref 0.61–1.24)
GFR calc Af Amer: 58 mL/min — ABNORMAL LOW (ref >60)
GFR calc non Af Amer: 50 mL/min — ABNORMAL LOW (ref >60)
Glucose, Bld: 194 mg/dL — ABNORMAL HIGH (ref 65–99)
Potassium: 4.8 mmol/L (ref 3.5–5.1)
Sodium: 139 mmol/L (ref 135–145)

## 2015-12-07 LAB — CBC WITH DIFFERENTIAL/PLATELET
Basophils Absolute: 0 10*3/uL (ref 0.0–0.1)
Basophils Relative: 0 %
Eosinophils Absolute: 0.3 10*3/uL (ref 0.0–0.7)
Eosinophils Relative: 5 %
HCT: 39.3 % (ref 39.0–52.0)
Hemoglobin: 12.2 g/dL — ABNORMAL LOW (ref 13.0–17.0)
Lymphocytes Relative: 22 %
Lymphs Abs: 1.5 10*3/uL (ref 0.7–4.0)
MCH: 30 pg (ref 26.0–34.0)
MCHC: 31 g/dL (ref 30.0–36.0)
MCV: 96.6 fL (ref 78.0–100.0)
Monocytes Absolute: 0.4 10*3/uL (ref 0.1–1.0)
Monocytes Relative: 6 %
Neutro Abs: 4.6 10*3/uL (ref 1.7–7.7)
Neutrophils Relative %: 67 %
Platelets: 154 10*3/uL (ref 150–400)
RBC: 4.07 MIL/uL — ABNORMAL LOW (ref 4.22–5.81)
RDW: 14.8 % (ref 11.5–15.5)
WBC: 6.9 10*3/uL (ref 4.0–10.5)

## 2015-12-07 LAB — CBG MONITORING, ED: Glucose-Capillary: 70 mg/dL (ref 65–99)

## 2015-12-07 LAB — PROTIME-INR
INR: 1.18
Prothrombin Time: 15 seconds (ref 11.4–15.2)

## 2015-12-07 LAB — GLUCOSE, POCT (MANUAL RESULT ENTRY): POC Glucose: 168 mg/dl — AB (ref 70–99)

## 2015-12-07 MED ORDER — METHOCARBAMOL 500 MG PO TABS: 500.0000 mg | ORAL_TABLET | Freq: Two times a day (BID) | ORAL | 0 refills | Status: DC

## 2015-12-07 MED ORDER — IOPAMIDOL (ISOVUE-300) INJECTION 61%
INTRAVENOUS | Status: AC
  Filled 2015-12-07: qty 100

## 2015-12-07 MED ORDER — TRAMADOL HCL 50 MG PO TABS: 100.0000 mg | ORAL_TABLET | Freq: Four times a day (QID) | ORAL | 0 refills | Status: DC | PRN

## 2015-12-07 NOTE — ED Notes (Signed)
Patient states he does not need an ice pack for his knee at this time

## 2015-12-07 NOTE — ED Notes (Signed)
CBG is 70.

## 2015-12-07 NOTE — ED Notes (Signed)
Brought patient back via wheelchair with family in tow; patient undressed, in gown, on monitor, continuous pulse oximetry and blood pressure cuff; visitor at bedside

## 2015-12-07 NOTE — ED Notes (Signed)
Pt given extended education and instructions given, pt eating sandwich.

## 2015-12-07 NOTE — Progress Notes (Signed)
Subjective:    Patient ID: Luis Yu, male    DOB: May 28, 1945, 70 y.o.   MRN: [id number]  HPI  70 year old male who  has a past medical history of Adjustment disorder with mixed emotional features; Atrial flutter (HCC); CAD (coronary artery disease); Depression; Diabetes mellitus type II, uncontrolled (HCC); Diabetic peripheral neuropathy (HCC); Erectile dysfunction; GERD (gastroesophageal reflux disease); Herpes zoster; Hyperlipidemia; Hypertension; Obesity; Osteoarthritis; Postherpetic neuralgia; and Sleep apnea.  He presents with his wife to this visit s/p 2 separate falls within the last 24 hours. He first reports that last night he fell while outside in his yard at around 4 pm. He reports that he was using a metal poll while walking outside and had a trip and fall. He reports falling backwards hitting his head on the ground.   The second fall happened around 5:30 pm while inside when he again tripped hitting his right knee and right abdomen on a recliner.   He denies syncope or near syncope   He denies any head pain, blurred vision or cognitive impairment. He does report tenderness to his right knee ( hx of right knee replacement) as well as tenderness to right abdomen.   He denies any bruising to abdomen   His wife reports that she wanted to take him to the ER yesterday but the patient refused. She also reports that " he seemed normal last night and when he woke up this morning." As the afternoon has gone on she feels as though he may be becoming more confused.    Review of Systems  Respiratory: Positive for shortness of breath.   Cardiovascular: Negative.   Gastrointestinal: Positive for abdominal pain. Negative for abdominal distention, blood in stool, constipation, diarrhea, rectal pain and vomiting.  Genitourinary: Negative.   Musculoskeletal: Positive for gait problem.  Skin: Negative.   Neurological: Positive for weakness. Negative for dizziness, syncope, facial  asymmetry, speech difficulty, light-headedness and headaches.  All other systems reviewed and are negative.  Past Medical History:  Diagnosis Date  . Adjustment disorder with mixed emotional features   . Atrial flutter (HCC)   . CAD (coronary artery disease)   . Depression   . Diabetes mellitus type II, uncontrolled (HCC)   . Diabetic peripheral neuropathy (HCC)   . Erectile dysfunction   . GERD (gastroesophageal reflux disease)   . Herpes zoster   . Hyperlipidemia   . Hypertension   . Obesity   . Osteoarthritis   . Postherpetic neuralgia   . Sleep apnea     Social History   Social History  . Marital status: Married    Spouse name: [name]  . Number of children: 1  . Years of education: 7213   Occupational History  . Retired Naval architecttruck driver Retired   Social History Main Topics  . Smoking status: Never Smoker  . Smokeless tobacco: Never Used  . Alcohol use 1.2 oz/week    2 Standard drinks or equivalent per week     Comment: twice a month.  . Drug use: No  . Sexual activity: Yes    Partners: Female   Other Topics Concern  . Not on file   Social History Narrative   Married [name]([name])   Regular exercise: none   Caffeine use: cup of coffee daily    Retired Naval architecttruck driver   Caffeine- twice daily             Past Surgical History:  Procedure Laterality Date  . CARDIOVERSION N/A  11/10/2015   Procedure: CARDIOVERSION;  Surgeon: Laurey Morale, MD;  Location: [facility] ENDOSCOPY;  Service: Cardiovascular;  Laterality: N/A;  . CORONARY ARTERY BYPASS GRAFT  1996   eith a LIMA to the LAD, saphenous vein graft to the acute marginal, saphenous vein graft to the PDA and saphenous vein graft to the circumflex.  [name] KNEE ARTHROSCOPY  05/2004   right knee  . REPLACEMENT TOTAL KNEE BILATERAL    . SHOULDER SURGERY      Family History  Problem Relation Age of Onset  . Coronary artery disease Mother     deceased at 42  . Coronary artery disease Father     died age 27  . Stroke       half brother  . Stroke Brother     No Known Allergies  Current Outpatient Prescriptions on File Prior to Visit  Medication Sig Dispense Refill  . apixaban (ELIQUIS) 5 MG TABS tablet Take 1 tablet (5 mg total) by mouth 2 (two) times daily. 60 tablet 11  . aspirin EC 81 MG tablet Take 1 tablet (81 mg total) by mouth daily.    [name] atorvastatin (LIPITOR) 80 MG tablet Take 1 tablet (80 mg total) by mouth daily. 90 tablet 1  . Cyanocobalamin (B-12) 5000 MCG SUBL Place 1 tablet under the tongue once a week.    . fluticasone (FLONASE) 50 MCG/ACT nasal spray Place 2 sprays into both nostrils daily as needed for allergies.     [name] gabapentin (NEURONTIN) 300 MG capsule Take 600 mg by mouth 2 (two) times daily.    [name] glipiZIDE (GLUCOTROL) 10 MG tablet Take 1 tablet (10 mg total) by mouth 2 (two) times daily before a meal. 180 tablet 3  . glucose blood (ACCU-CHEK ACTIVE STRIPS) test strip Use as instructed 100 each 12  . Insulin Pen Needle (PEN NEEDLES 31GX5/16") 31G X 8 MM MISC Use to inject insulin 5 times a day. 100 each 11  . isosorbide mononitrate (IMDUR) 30 MG 24 hr tablet Take 1 tablet (30 mg total) by mouth daily. 30 tablet 12  . Lancets (ACCU-CHEK MULTICLIX) lancets Use up to 5 times a day to check blood sugar. 100 each 5  . lisinopril-hydrochlorothiazide (PRINZIDE,ZESTORETIC) 20-12.5 MG tablet TAKE 1 TABLET BY MOUTH EVERY DAY 90 tablet 1  . metFORMIN (GLUCOPHAGE) 1000 MG tablet Take 1 tablet (1,000 mg total) by mouth 2 (two) times daily with a meal. 180 tablet 3  . Nebivolol HCl 20 MG TABS Take 1 tablet (20 mg total) by mouth daily. 90 tablet 1  . NOVOLOG FLEXPEN 100 UNIT/ML FlexPen USE 15 TO 30 UNITS 15 MINUTES BEFORE MEALS 3 TIMES DAILY 5 pen 0  . omeprazole (PRILOSEC) 20 MG capsule TAKE 1 CAPSULE (20 MG TOTAL) BY MOUTH 2 (TWO) TIMES DAILY BEFORE A MEAL. 180 capsule 0  . TOUJEO SOLOSTAR 300 UNIT/ML SOPN INJECT 30 UNITS INTO THE SKIN AT BEDTIME. 4.5 mL 1   Current Facility-Administered Medications  on File Prior to Visit  Medication Dose Route Frequency Provider Last Rate Last Dose  . pneumococcal 13-valent conjugate vaccine (PREVNAR 13) injection 0.5 mL  0.5 mL Intramuscular Tomorrow-1000 Mounir Skipper, NP        BP (!) 148/52 (BP Location: Left Arm, Patient Position: Sitting, Cuff Size: Large)   Pulse 63   Ht 6\' 2"  (1.88 m)   Wt 234 lb 12.8 oz (106.5 kg)   SpO2 97%   BMI 30.15 kg/m       Objective:  Physical Exam  Constitutional: He is oriented to person, place, and time. He appears well-developed and well-nourished. No distress.  Eyes: Conjunctivae and EOM are normal. Right eye exhibits no discharge. Left eye exhibits no discharge. Right pupil is reactive (sluggish ). Left pupil is reactive.  Neck: Normal range of motion. Neck supple.  Cardiovascular: Normal rate, regular rhythm, normal heart sounds and intact distal pulses.  Exam reveals no gallop and no friction rub.   No murmur heard. Pulmonary/Chest: Effort normal and breath sounds normal. No respiratory distress. He has no wheezes. He has no rales. He exhibits no tenderness.  Abdominal: Soft. Normal appearance and bowel sounds are normal. He exhibits no pulsatile liver, no fluid wave, no abdominal bruit, no ascites and no mass. There is no hepatosplenomegaly, splenomegaly or hepatomegaly. There is tenderness in the right upper quadrant. There is no rigidity, no rebound, no guarding and no CVA tenderness.    Musculoskeletal: He exhibits edema and tenderness. He exhibits no deformity.       Right knee: He exhibits swelling. He exhibits normal range of motion, no deformity and no erythema. Tenderness found.  Tenderness to right knee with palpation. No tenderness to back of the head.   Neurological: He is alert and oriented to person, place, and time. He has normal strength and normal reflexes. No cranial nerve deficit or sensory deficit. GCS eye subscore is 4. GCS verbal subscore is 5. GCS motor subscore is 6.  He has  slurred speech at baseline. Today his speech seems slightly more slurred and he appears to be confused when it comes to the plan of care. He seems fixated on his knee pain in the office today   Skin: Skin is warm and dry. No rash noted. He is not diaphoretic. No erythema. No pallor.  No bruising noted to head, right knee or abdomen   Psychiatric: He has a normal mood and affect. His behavior is normal. Judgment and thought content normal.  Nursing note and vitals reviewed.     Assessment & Plan:   1. Head injury, initial encounter - POCT glucose (manual entry) - Concern for potential hemorrhage being on Eliquis. I feel as though he needs further evaluation in the ER.  - He is willing to be evaluated but will not take am ambulance there. His wife will drive him  - Consulting civil engineer notified at [employer]  2. Right upper quadrant pain - Likely bruise of abdomen but needs further evaluation due to being on Eliquis  Shirline Frees, NP

## 2015-12-07 NOTE — ED Notes (Signed)
Pt to XR

## 2015-12-07 NOTE — Progress Notes (Signed)
Pre visit review using our clinic review tool, if applicable. No additional management support is needed unless otherwise documented below in the visit note. 

## 2015-12-07 NOTE — Telephone Encounter (Signed)
Pt has been scheduled.  °

## 2015-12-07 NOTE — ED Provider Notes (Signed)
[facility]-EMERGENCY DEPT Provider Note   CSN: [id number] Arrival date & time: 12/07/15  1433     History   Chief Complaint Chief Complaint  Patient presents with  . Fall    HPI Luis Yu is a 70 y.o. male. 70 y/o male with a significant past medical history including CAD, atrial flutter (on eliquis), poorly controlled T2DM on insulin, peripheral neuropathy, HTN, HLD who presents after having two falls yesterday. Pt reports yesterday around 4pm he was walking in his yard on unsteady ground. He lost his footing and fell backwards, landing on his back and possibly hitting the back of his head. He denies LOC. Denies any chest pain, palpitations, SOB or dizziness leading up to the fall. Later that evening around 6pm, he was getting out of his recliner and lost his balance and falling onto the arm of the chair, which hit him in his RUQ. He did not fall to the ground or hit his head. No LOC, chest pain, SOB, dizziness associated with this fall. Pt has had persistent pain, so the spouse convinced him to seek care today, and he was evaluated by his PCP who requested that pt come to the ER for further evaluation.  In the ED, the patient complained of pain in his RUQ, ribs and right knee. He notes that the pain in his knee has been present for weeks, but worsened after the fall. The pain on his right side was present overnight and this morning, and is currently at 5/10. The pain has not increased substantially since the falls. He reports that the pain is worse with deep breathing, and with palpation. He denies having feelings of chest pain, SOB, palpitations, dizziness, confusion, or headaches in the last 24 hours. He has not noticed any bruising or skin changes. No changes in bowel or bladder function, and denies incontinence.  HPI  Past Medical History:  Diagnosis Date  . Adjustment disorder with mixed emotional features   . Atrial flutter (HCC)   . CAD (coronary artery disease)   . Depression   .  Diabetes mellitus type II, uncontrolled (HCC)   . Diabetic peripheral neuropathy (HCC)   . Erectile dysfunction   . GERD (gastroesophageal reflux disease)   . Herpes zoster   . Hyperlipidemia   . Hypertension   . Obesity   . Osteoarthritis   . Postherpetic neuralgia   . Sleep apnea     Patient Active Problem List   Diagnosis Date Noted  . Atrial fibrillation (HCC) 10/27/2015  . Uncoordinated movements 02/24/2014  . Unspecified hereditary and idiopathic peripheral neuropathy 02/24/2013  . Low back pain 02/24/2013  . Abnormality of gait 02/09/2013  . Memory loss, short term 12/05/2011  . LUMBAR STRAIN, ACUTE 05/05/2010  . OSA (obstructive sleep apnea) 09/14/2009  . BPH (benign prostatic hyperplasia) 07/27/2009  . CARDIOVASCULAR STUDIES, ABNORMAL 07/27/2009  . CHEST PAIN 06/08/2009  . OSTEOARTHRITIS 05/30/2009  . HEEL PAIN, LEFT 05/24/2009  . Dyspnea 05/24/2009  . ADJUSTMENT DISORDER WITH MIXED FEATURES 08/25/2008  . Diabetes type 2, uncontrolled (HCC) 02/24/2008  . ERECTILE DYSFUNCTION 02/10/2007  . Hyperlipidemia 10/09/2006  . OBESITY 10/09/2006  . Essential hypertension 10/09/2006  . Coronary atherosclerosis 10/09/2006  . GERD 10/09/2006  . DM (diabetes mellitus) type II uncontrolled with retinopathy 06/10/2006    Past Surgical History:  Procedure Laterality Date  . CARDIOVERSION N/A 11/10/2015   Procedure: CARDIOVERSION;  Surgeon: Laurey Morale, MD;  Location: [facility] ENDOSCOPY;  Service: Cardiovascular;  Laterality: N/A;  .  CORONARY ARTERY BYPASS GRAFT  1996   eith a LIMA to the LAD, saphenous vein graft to the acute marginal, saphenous vein graft to the PDA and saphenous vein graft to the circumflex.  [name] KNEE ARTHROSCOPY  05/2004   right knee  . REPLACEMENT TOTAL KNEE BILATERAL    . SHOULDER SURGERY         Home Medications    Prior to Admission medications   Medication Sig Start Date End Date Taking? Authorizing Provider  apixaban (ELIQUIS) 5 MG TABS tablet  Take 1 tablet (5 mg total) by mouth 2 (two) times daily. 11/17/15   Newman Nip, NP  aspirin EC 81 MG tablet Take 1 tablet (81 mg total) by mouth daily. 10/14/15   Brittainy Sherlynn Carbon, PA-C  atorvastatin (LIPITOR) 80 MG tablet Take 1 tablet (80 mg total) by mouth daily. 10/21/15   Shirline Frees, NP  Cyanocobalamin (B-12) 5000 MCG SUBL Place 1 tablet under the tongue once a week.    Historical Provider, MD  fluticasone (FLONASE) 50 MCG/ACT nasal spray Place 2 sprays into both nostrils daily as needed for allergies.  06/29/13   Historical Provider, MD  gabapentin (NEURONTIN) 300 MG capsule Take 600 mg by mouth 2 (two) times daily. 09/06/15   Historical Provider, MD  glipiZIDE (GLUCOTROL) 10 MG tablet Take 1 tablet (10 mg total) by mouth 2 (two) times daily before a meal. 10/21/15   Shirline Frees, NP  glucose blood (ACCU-CHEK ACTIVE STRIPS) test strip Use as instructed 10/22/14   Doe-Hyun R Artist Pais, DO  Insulin Pen Needle (PEN NEEDLES 31GX5/16") 31G X 8 MM MISC Use to inject insulin 5 times a day. 07/21/15   Doe-Hyun R Artist Pais, DO  isosorbide mononitrate (IMDUR) 30 MG 24 hr tablet Take 1 tablet (30 mg total) by mouth daily. 10/31/15   Rollene Rotunda, MD  Lancets (ACCU-CHEK MULTICLIX) lancets Use up to 5 times a day to check blood sugar. 06/05/13   Doe-Hyun R Artist Pais, DO  lisinopril-hydrochlorothiazide (PRINZIDE,ZESTORETIC) 20-12.5 MG tablet TAKE 1 TABLET BY MOUTH EVERY DAY 06/13/15   Doe-Hyun R Artist Pais, DO  metFORMIN (GLUCOPHAGE) 1000 MG tablet Take 1 tablet (1,000 mg total) by mouth 2 (two) times daily with a meal. 10/21/15   Shirline Frees, NP  Nebivolol HCl 20 MG TABS Take 1 tablet (20 mg total) by mouth daily. 10/21/15   Shirline Frees, NP  NOVOLOG FLEXPEN 100 UNIT/ML FlexPen USE 15 TO 30 UNITS 15 MINUTES BEFORE MEALS 3 TIMES DAILY 10/24/15   Roderick Pee, MD  omeprazole (PRILOSEC) 20 MG capsule TAKE 1 CAPSULE (20 MG TOTAL) BY MOUTH 2 (TWO) TIMES DAILY BEFORE A MEAL. 01/11/15   Doe-Hyun Sherran Needs, DO  TOUJEO SOLOSTAR 300 UNIT/ML SOPN  INJECT 30 UNITS INTO THE SKIN AT BEDTIME. 09/23/15   Shirline Frees, NP    Family History Family History  Problem Relation Age of Onset  . Coronary artery disease Mother     deceased at 26  . Coronary artery disease Father     died age 38  . Stroke      half brother  . Stroke Brother     Social History Social History  Substance Use Topics  . Smoking status: Never Smoker  . Smokeless tobacco: Never Used  . Alcohol use 1.2 oz/week    2 Standard drinks or equivalent per week     Comment: twice a month.     Allergies   Review of patient's allergies indicates no known allergies.  Review of Systems Review of Systems  ROS 10 Systems reviewed and are negative for acute change except as noted in the HPI.    Physical Exam Updated Vital Signs BP 169/93   Pulse 70   Temp 97.6 F (36.4 C) (Oral)   Resp 22   Wt 234 lb (106.1 kg)   SpO2 99%   BMI 30.04 kg/m   Physical Exam  Constitutional: He is oriented to person, place, and time. He appears well-developed.  HENT:  Head: Normocephalic and atraumatic.  Eyes: Conjunctivae and EOM are normal.  Neck: Neck supple.  No midline cspine tenderness  Cardiovascular: Normal rate and regular rhythm.   Pulmonary/Chest: Effort normal and breath sounds normal. No respiratory distress. He has no wheezes.  Right sided chest wall tenderness  Abdominal: Soft. Bowel sounds are normal. There is tenderness. There is guarding.  Pt has severe tenderness over the RUQ. No ecchymoses, no CVA tenderness  Musculoskeletal:  R knee effusion w/o deformity  Neurological: He is alert and oriented to person, place, and time.  Skin: Skin is warm.  Nursing note and vitals reviewed.    ED Treatments / Results  Labs (all labs ordered are listed, but only abnormal results are displayed) Labs Reviewed  BASIC METABOLIC PANEL - Abnormal; Notable for the following:       Result Value   Glucose, Bld 194 (*)    Creatinine, Ser 1.37 (*)    GFR calc non  Af Amer 50 (*)    GFR calc Af Amer 58 (*)    All other components within normal limits  CBC WITH DIFFERENTIAL/PLATELET - Abnormal; Notable for the following:    RBC 4.07 (*)    Hemoglobin 12.2 (*)    All other components within normal limits  URINALYSIS, ROUTINE W REFLEX MICROSCOPIC (NOT AT [facility]) - Abnormal; Notable for the following:    Glucose, UA 250 (*)    Bilirubin Urine SMALL (*)    Ketones, ur 15 (*)    All other components within normal limits  PROTIME-INR    EKG  EKG Interpretation None       Radiology Dg Ribs Unilateral W/chest Right  Result Date: 12/07/2015 CLINICAL DATA:  Larey Seat last night and landed on right side with right flank and rib pain. EXAM: RIGHT RIBS AND CHEST - 3+ VIEW COMPARISON:  10/21/2015 FINDINGS: Both lungs are clear. Patient has had a median sternotomy. Heart size is normal. Negative for a displaced right rib fracture. Degenerative changes at the right glenohumeral joint. Negative for a pneumothorax. IMPRESSION: No acute abnormality. Electronically Signed   By: Richarda Overlie M.D.   On: 12/07/2015 18:12   Ct Head Wo Contrast  Result Date: 12/07/2015 CLINICAL DATA:  Fall, hit head, on anti coagulation EXAM: CT HEAD WITHOUT CONTRAST TECHNIQUE: Contiguous axial images were obtained from the base of the skull through the vertex without intravenous contrast. COMPARISON:  None. FINDINGS: Brain: No evidence of acute infarction, hemorrhage, hydrocephalus, extra-axial collection or mass lesion/mass effect. Vascular: No hyperdense vessel or unexpected calcification. Skull: Normal. Negative for fracture or focal lesion. Sinuses/Orbits: No acute finding. Other: Mild global cortical atrophy.  No ventriculomegaly. Subcortical white matter and periventricular small vessel ischemic changes. Intracranial atherosclerosis IMPRESSION: No evidence of acute intracranial abnormality. Atrophy with small vessel ischemic changes. Electronically Signed   By: Charline Bills M.D.   On:  12/07/2015 16:05   Dg Knee Complete 4 Views Right  Result Date: 12/07/2015 CLINICAL DATA:  Fall yesterday with right knee  pain. Initial encounter. EXAM: RIGHT KNEE - COMPLETE 4+ VIEW COMPARISON:  None. FINDINGS: Total knee arthroplasty without periprosthetic fracture. The prosthesis in general appears well seated. Benign-appearing loosely organized chondroid type calcification in the distal femoral medullary space. Atherosclerosis. IMPRESSION: Total knee arthroplasty.  No acute finding. Electronically Signed   By: Marnee SpringJonathon  Watts M.D.   On: 12/07/2015 15:53    Procedures Procedures (including critical care time)  Medications Ordered in ED Medications  iopamidol (ISOVUE-300) 61 % injection (not administered)     Initial Impression / Assessment and Plan / ED Course  I have reviewed the triage vital signs and the nursing notes.  Pertinent labs & imaging results that were available during my care of the patient were reviewed by me and considered in my medical decision making (see chart for details).  Clinical Course    DDx includes: - ICH - Fractures - Contusions - Soft tissue injury - Intraabdominal bleeding / hematoma  Pt comes in with cc of fall. Pt is on eliquis. CT head, knee xrays and rib radiographs are normal. Pt is having severe RUQ tenderness on exam with guarding. He also has pleuritic chest pain and reproducible chest wall tenderness. Pt is on anticoagulation, so it is prudent that we rule out any intra-abdominal bleeding/hematoma. Hb is stable - CT abd ordered. With the CT abd, we have added CT chest to ensure there are no rib fractures or occult PTX.     Final Clinical Impressions(s) / ED Diagnoses   Final diagnoses:  None    New Prescriptions New Prescriptions   No medications on file     Derwood KaplanAnkit Chrystian Ressler, MD 12/07/15 2116

## 2015-12-07 NOTE — ED Triage Notes (Signed)
Pt had fall yesterday at home, he takes Eloquis. Pt reports right side flank pain and right knee pain. Pt unsure if he hit his head, pt a&o X4 and denies LOC.

## 2015-12-15 ENCOUNTER — Encounter: Payer: Self-pay | Admitting: Adult Health

## 2015-12-15 ENCOUNTER — Ambulatory Visit (INDEPENDENT_AMBULATORY_CARE_PROVIDER_SITE_OTHER): Payer: Medicare Other | Admitting: Adult Health

## 2015-12-15 ENCOUNTER — Ambulatory Visit: Payer: Medicare Other | Admitting: Adult Health

## 2015-12-15 DIAGNOSIS — G4733 Obstructive sleep apnea (adult) (pediatric): Secondary | ICD-10-CM

## 2015-12-15 DIAGNOSIS — I25810 Atherosclerosis of coronary artery bypass graft(s) without angina pectoris: Secondary | ICD-10-CM

## 2015-12-15 NOTE — Patient Instructions (Addendum)
Continue on CPAP At bedtime , add chin strap .  Change to AutoSet 10 to 20 .  Do not drive if sleepy.  [facility] .  Follow up with Dr. Vassie LollAlva  [facility] at [facility]  and As needed

## 2015-12-15 NOTE — Addendum Note (Signed)
Addended by: York RamGAY, Ozell Juhasz on: 12/15/2015 03:38 PM   Modules accepted: Orders

## 2015-12-15 NOTE — Assessment & Plan Note (Signed)
Severe OSA , improved on CPAP but residual events persist.  Will change to Auto Set , follow up with download in 6 weeks  (in past 2012 , central events emerged on higher pressure, will need to look for this on follow up )  Mask fitting today .  Add chin strap to see if this helps with leaks.   Plan  Patient Instructions  Continue on CPAP At bedtime , add chin strap .  Change to AutoSet 10 to 20 .  Do not drive if sleepy.  [facility] .  Follow up with Dr. Vassie LollAlva  [facility]  and As needed

## 2015-12-15 NOTE — Progress Notes (Signed)
Subjective:    Patient ID: Luis Yu, male    DOB: 04/12/1945, 70 y.o.   MRN: [id number]  HPI 70 yo male with OSA  Has DM, CAD s/p CABG, A Fib   TEST  PSG 6/27 /11  - wt 246 lbs -showed severe obstructive sleep apnea with AHI 50/h & desaturation to 87%, corrected by CPAP 10 cm  echo 3/11 showed RVSP 25, nml LV funtion Download 10/12- 01/28/10 >> excellent compliance , some residual events  (AHI 21/h, centrals 5/h ) on 10 cm, will increase to 12 cm & rechk, leak ++ on download, download on 12 cm shows leak ++, residual AHI 21/h,centrals 5/h,  good usage Downalod on 12 cm 3/6-06/20/10 shows increased centrals 11/h, with AHI 28/h, hypopneas 11/h   12/15/2015 Follow up : OSA  Pt returns for follow up for sleep apnea .  Pt has severe OSA and was recently started back on CPAP .  Says he is doing well on CPAP . Wears every night for 6 hr each night . Well rested.  Feels he can not live without it now.  Says mask leaks a lot . He is going by DME today to have checked. We discussed adding chin strap to his nasal mask.  Download shows excellent compliance with  AHI 22, usage at 7.5hr , +++leaks.   Had recent successful cardioversion for A Fib .     Past Medical History:  Diagnosis Date  . Adjustment disorder with mixed emotional features   . Atrial flutter (HCC)   . CAD (coronary artery disease)   . Depression   . Diabetes mellitus type II, uncontrolled (HCC)   . Diabetic peripheral neuropathy (HCC)   . Erectile dysfunction   . GERD (gastroesophageal reflux disease)   . Herpes zoster   . Hyperlipidemia   . Hypertension   . Obesity   . Osteoarthritis   . Postherpetic neuralgia   . Sleep apnea    Current Outpatient Prescriptions on File Prior to Visit  Medication Sig Dispense Refill  . apixaban (ELIQUIS) 5 MG TABS tablet Take 1 tablet (5 mg total) by mouth 2 (two) times daily. 60 tablet 11  . aspirin EC 81 MG tablet Take 1 tablet (81 mg total) by mouth daily.    [name]. atorvastatin  (LIPITOR) 80 MG tablet Take 1 tablet (80 mg total) by mouth daily. 90 tablet 1  . Cyanocobalamin (B-12) 5000 MCG SUBL Place 1 tablet under the tongue once a week.    . fluticasone (FLONASE) 50 MCG/ACT nasal spray Place 2 sprays into both nostrils daily as needed for allergies.     [name]. gabapentin (NEURONTIN) 300 MG capsule Take 600 mg by mouth 2 (two) times daily.    [name]. glipiZIDE (GLUCOTROL) 10 MG tablet Take 1 tablet (10 mg total) by mouth 2 (two) times daily before a meal. 180 tablet 3  . glucose blood (ACCU-CHEK ACTIVE STRIPS) test strip Use as instructed 100 each 12  . Insulin Pen Needle (PEN NEEDLES 31GX5/16") 31G X 8 MM MISC Use to inject insulin 5 times a day. 100 each 11  . isosorbide mononitrate (IMDUR) 30 MG 24 hr tablet Take 1 tablet (30 mg total) by mouth daily. 30 tablet 12  . Lancets (ACCU-CHEK MULTICLIX) lancets Use up to 5 times a day to check blood sugar. 100 each 5  . lisinopril-hydrochlorothiazide (PRINZIDE,ZESTORETIC) 20-12.5 MG tablet TAKE 1 TABLET BY MOUTH EVERY DAY 90 tablet 1  . metFORMIN (GLUCOPHAGE) 1000 MG tablet  Take 1 tablet (1,000 mg total) by mouth 2 (two) times daily with a meal. 180 tablet 3  . Nebivolol HCl 20 MG TABS Take 1 tablet (20 mg total) by mouth daily. 90 tablet 1  . NOVOLOG FLEXPEN 100 UNIT/ML FlexPen USE 15 TO 30 UNITS 15 MINUTES BEFORE MEALS 3 TIMES DAILY 5 pen 0  . omeprazole (PRILOSEC) 20 MG capsule TAKE 1 CAPSULE (20 MG TOTAL) BY MOUTH 2 (TWO) TIMES DAILY BEFORE A MEAL. 180 capsule 0  . TOUJEO SOLOSTAR 300 UNIT/ML SOPN INJECT 30 UNITS INTO THE SKIN AT BEDTIME. 4.5 mL 1   Current Facility-Administered Medications on File Prior to Visit  Medication Dose Route Frequency Provider Last Rate Last Dose  . pneumococcal 13-valent conjugate vaccine (PREVNAR 13) injection 0.5 mL  0.5 mL Intramuscular Tomorrow-1000 Shirline Frees, NP        Review of Systems Constitutional:   No  weight loss, night sweats,  Fevers, chills,  +fatigue, or  lassitude.  HEENT:    No headaches,  Difficulty swallowing,  Tooth/dental problems, or  Sore throat,                No sneezing, itching, ear ache, nasal congestion, post nasal drip,   CV:  No chest pain,  Orthopnea, PND, swelling in lower extremities, anasarca, dizziness, palpitations, syncope.   GI  No heartburn, indigestion, abdominal pain, nausea, vomiting, diarrhea, change in bowel habits, loss of appetite, bloody stools.   Resp:   No excess mucus, no productive cough,  No non-productive cough,  No coughing up of blood.  No change in color of mucus.  No wheezing.  No chest wall deformity  Skin: no rash or lesions.  GU: no dysuria, change in color of urine, no urgency or frequency.  No flank pain, no hematuria   MS:  No joint pain or swelling.  No decreased range of motion.  No back pain.  Psych:  No change in mood or affect. No depression or anxiety.  + memory loss.         Objective:   Physical Exam Vitals:   12/15/15 1102  BP: 120/62  Pulse: 67  SpO2: 97%  Weight: 229 lb (103.9 kg)  Height: 6\' 3"  (1.905 m)  Body mass index is 28.62 kg/m.   GEN: A/Ox3; pleasant , NAD, elderly    HEENT:  [place]/AT,  , NOSE-clear, THROAT-clear, no lesions, no postnasal drip or exudate noted. HOH -hearing aides Class 2 MP airway   NECK:  Supple w/ fair ROM; no JVD; normal carotid impulses w/o bruits; no thyromegaly or nodules palpated; no lymphadenopathy.    RESP  Clear  P & A; w/o, wheezes/ rales/ or rhonchi. no accessory muscle use, no dullness to percussion  CARD:  RRR ,   no m/r/g  , tr to none peripheral edema, pulses intact, no cyanosis or clubbing.  GI:   Soft & nt; nml bowel sounds; no organomegaly or masses detected.   Musco: Warm bil, no deformities or joint swelling noted.   Neuro: alert, gait imbalance   Skin: Warm, no lesions or rashes  Tyreona Panjwani NP-C  [facility]  12/15/2015        Assessment & Plan:

## 2015-12-19 NOTE — Progress Notes (Signed)
Reviewed & agree with plan  

## 2015-12-20 ENCOUNTER — Ambulatory Visit: Payer: Medicare Other | Admitting: Adult Health

## 2015-12-20 ENCOUNTER — Encounter: Payer: Self-pay | Admitting: Adult Health

## 2015-12-20 ENCOUNTER — Ambulatory Visit (INDEPENDENT_AMBULATORY_CARE_PROVIDER_SITE_OTHER): Payer: Medicare Other | Admitting: Adult Health

## 2015-12-20 VITALS — BP 138/60 | Temp 97.8°F | Ht 75.0 in | Wt 229.4 lb

## 2015-12-20 DIAGNOSIS — I25810 Atherosclerosis of coronary artery bypass graft(s) without angina pectoris: Secondary | ICD-10-CM

## 2015-12-20 DIAGNOSIS — M25561 Pain in right knee: Secondary | ICD-10-CM | POA: Diagnosis not present

## 2015-12-20 DIAGNOSIS — Z7409 Other reduced mobility: Secondary | ICD-10-CM | POA: Diagnosis not present

## 2015-12-20 NOTE — Progress Notes (Addendum)
Subjective:    Patient ID: Luis Yu, male    DOB: [birth year], 70 y.o.   MRN: [id number]  HPI  This is a 70 year old male presents to the office today for 2 week follow-up after being seen in the emergency room. I had sent him to the emergency room originally on 12/07/2015 after an office visit in which he endorsed falling multiple times within 24 hours he had hit the back of his head while being on Eliquis. At that time he is also complaining of right-sided abdominal pain in right knee pain due to the falls.   CT abdomen showed  1. Nondisplaced right lateral tenth rib fracture. 2. No evidence of other acute traumatic injury in the chest, abdomen, or pelvis. 3. Small lung nodules measuring up to 3 mm in size. No follow-up needed if patient is low-risk (and has no known or suspected primary neoplasm). Non-contrast chest CT can be considered in 12 months if patient is high-risk. This recommendation follows the consensus statement: Guidelines for Management of Incidental Pulmonary Nodules Detected on CT Images: From the Fleischner Society 2017; Radiology 2017; 284:228-243. 4. Aortic atherosclerosis.  CT Head showed IMPRESSION: No evidence of acute intracranial abnormality.  Atrophy with small vessel ischemic changes.  Today in the office he reports that he continues to have pain and localized weakness in his right knee/leg. He has not fallen since his ER visit. He did like to be able to do more outside of his home but feels as though he cannot due to the pain and weakness in his right knee.   Also continues to complain of shortness of breath with exertion. Per patient "sometimes I have good days and sometimes I have bad days when it comes to me feeling like him out of breath. "Recently has been seen by pulmonary for OSA and CPAP use.     Review of Systems  HENT: Negative.   Respiratory: Positive for shortness of breath. Negative for cough, chest tightness and wheezing.     Cardiovascular: Negative.   Musculoskeletal: Positive for arthralgias and gait problem. Negative for joint swelling, neck pain and neck stiffness.  Neurological: Positive for weakness. Negative for dizziness, seizures, light-headedness, numbness and headaches.  Psychiatric/Behavioral: Negative.   All other systems reviewed and are negative.  Past Medical History:  Diagnosis Date  . Adjustment disorder with mixed emotional features   . Atrial flutter (HCC)   . CAD (coronary artery disease)   . Depression   . Diabetes mellitus type II, uncontrolled (HCC)   . Diabetic peripheral neuropathy (HCC)   . Erectile dysfunction   . GERD (gastroesophageal reflux disease)   . Herpes zoster   . Hyperlipidemia   . Hypertension   . Obesity   . Osteoarthritis   . Postherpetic neuralgia   . Sleep apnea     Social History   Social History  . Marital status: Married    Spouse name: [name]  . Number of children: 1  . Years of education: 11   Occupational History  . Retired Naval architect Retired   Social History Main Topics  . Smoking status: Never Smoker  . Smokeless tobacco: Never Used  . Alcohol use 1.2 oz/week    2 Standard drinks or equivalent per week     Comment: twice a month.  . Drug use: No  . Sexual activity: Yes    Partners: Female   Other Topics Concern  . Not on file   Social History Narrative  Married [name])   Regular exercise: none   Caffeine use: cup of coffee daily    Retired Naval architect   Caffeine- twice daily             Past Surgical History:  Procedure Laterality Date  . CARDIOVERSION N/A 11/10/2015   Procedure: CARDIOVERSION;  Surgeon: Laurey Morale, MD;  Location[facility] ENDOSCOPY;  Service: Cardiovascular;  Laterality: N/A;  . CORONARY ARTERY BYPASS GRAFT  1996   eith a LIMA to the LAD, saphenous vein graft to the acute marginal, saphenous vein graft to the PDA and saphenous vein graft to the circumflex.  [name] KNEE ARTHROSCOPY  05/2004   right knee   . REPLACEMENT TOTAL KNEE BILATERAL    . SHOULDER SURGERY      Family History  Problem Relation Age of Onset  . Coronary artery disease Mother     deceased at 57  . Coronary artery disease Father     died age 24  . Stroke      half brother  . Stroke Brother     No Known Allergies  Current Outpatient Prescriptions on File Prior to Visit  Medication Sig Dispense Refill  . apixaban (ELIQUIS) 5 MG TABS tablet Take 1 tablet (5 mg total) by mouth 2 (two) times daily. 60 tablet 11  . aspirin EC 81 MG tablet Take 1 tablet (81 mg total) by mouth daily.    [name] atorvastatin (LIPITOR) 80 MG tablet Take 1 tablet (80 mg total) by mouth daily. 90 tablet 1  . Cyanocobalamin (B-12) 5000 MCG SUBL Place 1 tablet under the tongue once a week.    . fluticasone (FLONASE) 50 MCG/ACT nasal spray Place 2 sprays into both nostrils daily as needed for allergies.     [name] gabapentin (NEURONTIN) 300 MG capsule Take 600 mg by mouth 2 (two) times daily.    [name] glipiZIDE (GLUCOTROL) 10 MG tablet Take 1 tablet (10 mg total) by mouth 2 (two) times daily before a meal. 180 tablet 3  . glucose blood (ACCU-CHEK ACTIVE STRIPS) test strip Use as instructed 100 each 12  . Insulin Pen Needle (PEN NEEDLES 31GX5/16") 31G X 8 MM MISC Use to inject insulin 5 times a day. 100 each 11  . isosorbide mononitrate (IMDUR) 30 MG 24 hr tablet Take 1 tablet (30 mg total) by mouth daily. 30 tablet 12  . Lancets (ACCU-CHEK MULTICLIX) lancets Use up to 5 times a day to check blood sugar. 100 each 5  . lisinopril-hydrochlorothiazide (PRINZIDE,ZESTORETIC) 20-12.5 MG tablet TAKE 1 TABLET BY MOUTH EVERY DAY 90 tablet 1  . metFORMIN (GLUCOPHAGE) 1000 MG tablet Take 1 tablet (1,000 mg total) by mouth 2 (two) times daily with a meal. 180 tablet 3  . Nebivolol HCl 20 MG TABS Take 1 tablet (20 mg total) by mouth daily. 90 tablet 1  . NOVOLOG FLEXPEN 100 UNIT/ML FlexPen USE 15 TO 30 UNITS 15 MINUTES BEFORE MEALS 3 TIMES DAILY 5 pen 0  . omeprazole  (PRILOSEC) 20 MG capsule TAKE 1 CAPSULE (20 MG TOTAL) BY MOUTH 2 (TWO) TIMES DAILY BEFORE A MEAL. 180 capsule 0  . TOUJEO SOLOSTAR 300 UNIT/ML SOPN INJECT 30 UNITS INTO THE SKIN AT BEDTIME. 4.5 mL 1   Current Facility-Administered Medications on File Prior to Visit  Medication Dose Route Frequency Provider Last Rate Last Dose  . pneumococcal 13-valent conjugate vaccine (PREVNAR 13) injection 0.5 mL  0.5 mL Intramuscular Tomorrow-1000 Shirline Frees, NP  BP 138/60   Temp 97.8 F (36.6 C) (Oral)   Ht 6\' 3"  (1.905 m)   Wt 229 lb 6.4 oz (104.1 kg)   BMI 28.67 kg/m       Objective:   Physical Exam  Constitutional: He is oriented to person, place, and time. He appears well-developed and well-nourished. No distress.  Cardiovascular: Normal rate, regular rhythm, normal heart sounds and intact distal pulses.  Exam reveals no gallop and no friction rub.   No murmur heard. Pulmonary/Chest: Effort normal and breath sounds normal. No respiratory distress. He has no wheezes. He has no rales. He exhibits no tenderness.  Musculoskeletal: Normal range of motion. He exhibits tenderness. He exhibits no edema or deformity.       Right knee: He exhibits bony tenderness. He exhibits normal range of motion, no swelling, no effusion, no ecchymosis and normal meniscus. Tenderness found. No medial joint line, no lateral joint line, no MCL, no LCL and no patellar tendon tenderness noted.  Neurological: He is alert and oriented to person, place, and time. He has normal strength. He displays tremor. He displays no atrophy. No cranial nerve deficit or sensory deficit. He exhibits normal muscle tone. He displays no seizure activity.  Skin: Skin is warm and dry. No rash noted. He is not diaphoretic. No erythema. No pallor.  Psychiatric: He has a normal mood and affect. His behavior is normal. Judgment and thought content normal.  Nursing note and vitals reviewed.     Assessment & Plan:  1. Acute pain of right  knee - tylenol, ice, compression and wrap  - Ambulatory referral to Physical Therapy - Consider referral to sports medicine - Follow up if no improvement  2. Impaired functional mobility, balance, gait, and endurance  - Ambulatory referral to Physical Therapy   Shirline Freesory Mickell Birdwell, NP

## 2015-12-20 NOTE — Patient Instructions (Signed)
It was great seeing you again.   Someone will call you from Physical Therapy.   Please follow up with me if this does not help

## 2015-12-21 ENCOUNTER — Encounter: Payer: Self-pay | Admitting: Adult Health

## 2015-12-22 ENCOUNTER — Other Ambulatory Visit: Payer: Self-pay | Admitting: Internal Medicine

## 2015-12-28 ENCOUNTER — Ambulatory Visit: Admit: 2015-12-28 | Payer: Medicare Other | Admitting: Orthopedic Surgery

## 2015-12-28 SURGERY — IRRIGATION AND DEBRIDEMENT WOUND
Anesthesia: General | Site: Hand | Laterality: Right

## 2016-01-02 ENCOUNTER — Telehealth: Payer: Self-pay | Admitting: Adult Health

## 2016-01-02 ENCOUNTER — Ambulatory Visit: Payer: Medicare Other | Admitting: Physical Therapy

## 2016-01-02 DIAGNOSIS — G4733 Obstructive sleep apnea (adult) (pediatric): Secondary | ICD-10-CM

## 2016-01-02 NOTE — Telephone Encounter (Signed)
Thanks!  Order placed and Barbara Cower made aware. Nothing further needed.

## 2016-01-02 NOTE — Telephone Encounter (Signed)
Per TP Okay to place order

## 2016-01-02 NOTE — Telephone Encounter (Signed)
Pt could not be seen by rehab today because of the dx code of the order. The order needs to state in the dx code:gait and balance disorder/strengthening. They advised pt medicare may give him a hard time, so they want to wait until order is changed. Can you change?  Hopefully pt can be seen again soon asap.

## 2016-01-02 NOTE — Telephone Encounter (Signed)
[facility] states that the [facility]16 Pt does not have an auto machine and will need an order placed for CPAP auto loaner machine for the 2 week download. Please advise TP that you are okay with us ordering the CPAP loaner machine as the original order was placed under you for pressure change and download. Order needs to include the pressure setting of Auto 10-20 and needs to be sent today. Thanks.

## 2016-01-03 ENCOUNTER — Telehealth: Payer: Self-pay | Admitting: Cardiology

## 2016-01-03 NOTE — Telephone Encounter (Signed)
[facility]quis[facility]

## 2016-01-03 NOTE — Telephone Encounter (Signed)
Samples at the front desk. Wife notified. She is requesting that we inquire if there are samples at [address]. Please call wife and update if there are samples available.

## 2016-01-03 NOTE — Addendum Note (Signed)
Addended by: Nancy FetterNAFZIGER, Amberle Lyter L on: 01/03/2016 06:26 AM   Modules accepted: Orders

## 2016-01-03 NOTE — Telephone Encounter (Signed)
New Message  Patient calling the office for samples of medication:   1.  What medication and dosage are you requesting samples for? Eliquis   2.  Are you currently out of this medication? Per pt wife yes

## 2016-01-03 NOTE — Telephone Encounter (Signed)
Pt.notified

## 2016-01-04 ENCOUNTER — Ambulatory Visit: Payer: Medicare Other | Attending: Adult Health | Admitting: Physical Therapy

## 2016-01-04 DIAGNOSIS — M6281 Muscle weakness (generalized): Secondary | ICD-10-CM | POA: Diagnosis not present

## 2016-01-04 DIAGNOSIS — R2689 Other abnormalities of gait and mobility: Secondary | ICD-10-CM | POA: Diagnosis not present

## 2016-01-04 DIAGNOSIS — R260 Ataxic gait: Secondary | ICD-10-CM | POA: Insufficient documentation

## 2016-01-04 NOTE — Therapy (Signed)
[name] [address] [place], [place], [address] Phone: [phone]   Fax:  [phone]  Physical Therapy Evaluation  Patient Details  Name: Luis Yu MRN: [id number] Date of Birth: July 05, 1945 Referring Provider: Shirline Frees MD.  Encounter Date: 01/04/2016      PT End of Session - 01/04/16 1703    Visit Number 1   Number of Visits 24   Date for PT Re-Evaluation 03/04/16   PT Start Time 0117   PT Stop Time 0203   PT Time Calculation (min) 46 min   Activity Tolerance Patient limited by fatigue   Behavior During Therapy [facility] for tasks assessed/performed      Past Medical History:  Diagnosis Date  . Adjustment disorder with mixed emotional features   . Atrial flutter (HCC)   . CAD (coronary artery disease)   . Depression   . Diabetes mellitus type II, uncontrolled (HCC)   . Diabetic peripheral neuropathy (HCC)   . Erectile dysfunction   . GERD (gastroesophageal reflux disease)   . Herpes zoster   . Hyperlipidemia   . Hypertension   . Obesity   . Osteoarthritis   . Postherpetic neuralgia   . Sleep apnea     Past Surgical History:  Procedure Laterality Date  . CARDIOVERSION N/A 11/10/2015   Procedure: CARDIOVERSION;  Surgeon: Laurey Morale, MD;  Location: [facility] ENDOSCOPY;  Service: Cardiovascular;  Laterality: N/A;  . CORONARY ARTERY BYPASS GRAFT  1996   eith a LIMA to the LAD, saphenous vein graft to the acute marginal, saphenous vein graft to the PDA and saphenous vein graft to the circumflex.  [name] KNEE ARTHROSCOPY  05/2004   right knee  . REPLACEMENT TOTAL KNEE BILATERAL    . SHOULDER SURGERY      There were no vitals filed for this visit.       Subjective Assessment - 01/04/16 1707    Subjective The patient presents to outpatient physical therapy with his wife.  His CC is that of balance losses and resultant falls.  His wife states that he has a FWW but usually does not use it.  A fall in September  reportedly resulted in striking his head and abdomen area breaking ribs.    He also reports a fall in his recently.  His wife is very concerned he will fall again especially since he is non-compliant to using his walker.  I informed the patient that he needs to use his walker at all times for safety as he is at high risk for falls.   Patient is accompained by: Family member   Pertinent History Recent falls.  Peripheral neuropathy.   Limitations Walking   Patient Stated Goals Walk better and improvebalance.   Currently in Pain? Yes   Pain Score 7    Pain Location Knee   Pain Orientation Right   Pain Descriptors / Indicators Aching   Pain Type Acute pain   Pain Onset 1 to 4 weeks ago   Pain Frequency Constant   Aggravating Factors  Walking.            [facility] PT Assessment - 01/04/16 0001      Assessment   Medical Diagnosis Impaired functional mobility.   Referring Provider Shirline Frees MD.   Onset Date/Surgical Date --  July 2017.   Next MD Visit --  01/10/16.     Precautions   Precautions Fall  HOH.   Precaution Comments Patient is at high risk for falls.  Patient needs to be using a walker.  Gait belt recommended for additional safety.     Restrictions   Weight Bearing Restrictions No     Balance Screen   Has the patient fallen in the past 6 months Yes   How many times? --  2.   Has the patient had a decrease in activity level because of a fear of falling?  Yes   Is the patient reluctant to leave their home because of a fear of falling?  Yes     Home [employer] information [employer] residence     Prior Function   Vocation Retired     Nurse, mental health intact   Problem Solving Appears intact     Posture/Postural Control   Posture/Postural Control Postural limitations   Postural Limitations Rounded Shoulders;Forward head;Decreased lumbar lordosis  RT LE ER.     ROM / Strength   AROM / PROM / Strength AROM;Strength     AROM   Overall  AROM Comments Bilateral LE ROM is WFL.  Bilateral total knee replacement surgeries.     Strength   Overall Strength Comments Bilateral hip flex/abd= 4/5; bil knee strength= 4/5; bilateral ankle strength= 3+ to 4-/5.     Special Tests    Special Tests --  (+) Romberg test.     Ambulation/Gait   Gait Pattern Decreased step length - right;Decreased step length - left;Decreased stride length;Decreased hip/knee flexion - right;Decreased hip/knee flexion - left;Decreased dorsiflexion - right;Decreased dorsiflexion - left;Right flexed knee in stance;Left flexed knee in stance;Shuffle;Ataxic;Trunk flexed;Poor foot clearance - left;Poor foot clearance - right   Gait Comments Ambulated with patient in clinic with minimal assist for safety.  His giat is very ataxic in nature and uncoordinated.     Standardized Balance Assessment   Standardized Balance Assessment Berg Balance Test     Berg Balance Test   Sit to Stand Able to stand  independently using hands   Standing Unsupported Needs several tries to stand 30 seconds unsupported   Sitting with Back Unsupported but Feet Supported on Floor or Stool Able to sit safely and securely 2 minutes   Stand to Sit Controls descent by using hands   Transfers Able to transfer with verbal cueing and /or supervision   Standing Unsupported with Eyes Closed Able to stand 3 seconds   Standing Ubsupported with Feet Together Needs help to attain position but able to stand for 30 seconds with feet together   From Standing, Reach Forward with Outstretched Arm Can reach forward >12 cm safely (5")   From Standing Position, Pick up Object from Floor Unable to pick up shoe, but reaches 2-5 cm (1-2") from shoe and balances independently   From Standing Position, Turn to Look Behind Over each Shoulder Looks behind one side only/other side shows less weight shift   Turn 360 Degrees Able to turn 360 degrees safely but slowly   Standing Unsupported, Alternately Place Feet on  Step/Stool Able to complete >2 steps/needs minimal assist   Standing Unsupported, One Foot in [employer] balance while stepping or standing   Standing on One Leg Unable to try or needs assist to prevent fall   Total Score 27                   [facility] Adult PT Treatment/Exercise - 01/04/16 0001      Exercises   Exercises Knee/Hip     Knee/Hip Exercises: Aerobic   Nustep Level 3  x 8 minutes.  02 sat= 98%.                  PT Short Term Goals - 01/04/16 1741      PT SHORT TERM GOAL #1   Title Independent with an initial HEP.   Time 4   Period Weeks   Status New     PT SHORT TERM GOAL #2   Title Increase Berg score to 32/56.   Time 4   Period Weeks   Status New           PT Long Term Goals - 01/04/16 1742      PT LONG TERM GOAL #1   Title Independent with an advanced HEP.   Time 8   Period Weeks   Status New     PT LONG TERM GOAL #2   Title Increase bilateral LE strength to a solid 4+/5 to increase stability for functional activites.   Time 8   Period Weeks   Status New     PT LONG TERM GOAL #3   Title Walk in clinic with QC 500 feet with supervision only.   Time 8   Period Weeks   Status New     PT LONG TERM GOAL #4   Title Perform a reciprocating stair gait with one railing.   Time 8   Period Weeks     PT LONG TERM GOAL #5   Title Increase Berg score to 46-47/56.   Time 8   Period Weeks   Status New               Plan - 01/04/16 1731    Clinical Impression Statement The patient presents to outpatient physical therapy with a worsenening and evolving problem.  He scored a 27/56 on the Berg balance test and has a positive Romberg test.  Additionally he has global bilateral LE strength losses and his peripheral neuropathy is certainly playing a part in his poor foot clearance and shuffled  gait pattern.  He has been non-compliant to using his walker whicg was strongly recommended thsat he use.  Multiple comorbidites contribute  to this evolving problem as well.   Rehab Potential Fair   PT Frequency 3x / week   PT Duration 8 weeks   PT Treatment/Interventions ADLs/Self Care Home Management;Functional mobility training;Stair training;Gait training;Therapeutic activities;Therapeutic exercise;Balance training;Neuromuscular re-education;Patient/family education   PT Next Visit Plan Nustep.  Balance and gait training.  Please gait train with a walker in clinic.  Bilateral LE strengthening.   Consulted and Agree with Plan of Care Patient;Family member/caregiver      Patient will benefit from skilled therapeutic intervention in order to improve the following deficits and impairments:  Abnormal gait, Decreased activity tolerance, Decreased balance, Decreased coordination, Decreased endurance, Decreased mobility, Decreased strength, Difficulty walking, Pain  Visit Diagnosis: Ataxic gait - Plan: PT plan of care cert/re-cert  Other abnormalities of gait and mobility - Plan: PT plan of care cert/re-cert  Muscle weakness (generalized) - Plan: PT plan of care cert/re-cert      G-Codes - 01/04/16 1751    Functional Assessment Tool Used Clinical judgement.   Functional Limitation Mobility: Walking and moving around   Mobility: Walking and Moving Around Goal Status [phone]([id number]) At least 20 percent but less than 40 percent impaired, limited or restricted       Problem List Patient Active Problem List   Diagnosis Date Noted  . Atrial fibrillation (HCC) 10/27/2015  . Uncoordinated movements 02/24/2014  .  Unspecified hereditary and idiopathic peripheral neuropathy 02/24/2013  . Low back pain 02/24/2013  . Abnormality of gait 02/09/2013  . Memory loss, short term 12/05/2011  . LUMBAR STRAIN, ACUTE 05/05/2010  . OSA (obstructive sleep apnea) 09/14/2009  . BPH (benign prostatic hyperplasia) 07/27/2009  . CARDIOVASCULAR STUDIES, ABNORMAL 07/27/2009  . CHEST PAIN 06/08/2009  . OSTEOARTHRITIS 05/30/2009  . HEEL PAIN, LEFT  05/24/2009  . Dyspnea 05/24/2009  . ADJUSTMENT DISORDER WITH MIXED FEATURES 08/25/2008  . Diabetes type 2, uncontrolled (HCC) 02/24/2008  . ERECTILE DYSFUNCTION 02/10/2007  . Hyperlipidemia 10/09/2006  . OBESITY 10/09/2006  . Essential hypertension 10/09/2006  . Coronary atherosclerosis 10/09/2006  . GERD 10/09/2006  . DM (diabetes mellitus) type II uncontrolled with retinopathy 06/10/2006    Herley Bernardini, [place] MPT 01/04/2016, 5:56 PM  [facility] [address] [place], [place], [address] Phone: [phone]   Fax:  [phone]  Name: Luis Yu MRN: [id number] Date of Birth: 1945-12-27

## 2016-01-05 ENCOUNTER — Ambulatory Visit: Payer: Medicare Other | Admitting: *Deleted

## 2016-01-05 DIAGNOSIS — R260 Ataxic gait: Secondary | ICD-10-CM

## 2016-01-05 DIAGNOSIS — R2689 Other abnormalities of gait and mobility: Secondary | ICD-10-CM | POA: Diagnosis not present

## 2016-01-05 DIAGNOSIS — M6281 Muscle weakness (generalized): Secondary | ICD-10-CM | POA: Diagnosis not present

## 2016-01-05 NOTE — Therapy (Signed)
[name] [address] [place], [place], [address] Phone: [phone]   Fax:  [phone]  Physical Therapy Treatment  Patient Details  Name: Luis ImusDonald W Mandley MRN: [id number] Date of Birth: 1946-01-02 Referring Provider: Shirline Freesory Nafziger MD.  Encounter Date: 01/05/2016      PT End of Session - 01/05/16 1607    Visit Number 2   Number of Visits 24   Date for PT Re-Evaluation 03/04/16   PT Start Time 1515   PT Stop Time 1605   PT Time Calculation (min) 50 min      Past Medical History:  Diagnosis Date  . Adjustment disorder with mixed emotional features   . Atrial flutter (HCC)   . CAD (coronary artery disease)   . Depression   . Diabetes mellitus type II, uncontrolled (HCC)   . Diabetic peripheral neuropathy (HCC)   . Erectile dysfunction   . GERD (gastroesophageal reflux disease)   . Herpes zoster   . Hyperlipidemia   . Hypertension   . Obesity   . Osteoarthritis   . Postherpetic neuralgia   . Sleep apnea     Past Surgical History:  Procedure Laterality Date  . CARDIOVERSION N/A 11/10/2015   Procedure: CARDIOVERSION;  Surgeon: Laurey Moralealton S McLean, MD;  [facility];  Service: Cardiovascular;  Laterality: N/A;  . CORONARY ARTERY BYPASS GRAFT  1996   eith a LIMA to the LAD, saphenous vein graft to the acute marginal, saphenous vein graft to the PDA and saphenous vein graft to the circumflex.  [name]. KNEE ARTHROSCOPY  05/2004   right knee  . REPLACEMENT TOTAL KNEE BILATERAL    . SHOULDER SURGERY      There were no vitals filed for this visit.      Subjective Assessment - 01/05/16 1520    Patient is accompained by: Family member   Pertinent History Recent falls.  Peripheral neuropathy.   Limitations Walking   Patient Stated Goals Walk better and improvebalance.   Currently in Pain? Yes   Pain Score 7    Pain Location Knee   Pain Orientation Right   Pain Descriptors / Indicators Aching   Pain Type Acute pain   Pain Onset 1 to 4 weeks ago                         [facility]/Ex[facility] - 01/05/16 0001      Ambulation/Gait   Ambulation/Gait Yes   Ambulation/Gait Assistance 5: Supervision;4: Min guard  CGA   Ambulation Distance (Feet) 302 Feet   Assistive device Rolling walker   Gait Pattern Decreased step length - right;Decreased step length - left;Decreased stride length;Decreased hip/knee flexion - right;Decreased hip/knee flexion - left;Decreased dorsiflexion - right;Decreased dorsiflexion - left;Right flexed knee in stance;Left flexed knee in stance;Shuffle;Ataxic;Trunk flexed;Poor foot clearance - left;Poor foot clearance - right   Ambulation Surface Level;Indoor   Gait Comments Ambulated with patient in clinic with minimal assist for safety.  His giat is very ataxic in nature and uncoordinated.     Knee/Hip Exercises: Aerobic   Nustep Level 4 x 15   minutes.  02 sat= 96%.     Knee/Hip Exercises: Standing   Other Standing Knee Exercises standing on airex pad diagonal UE reaches 2x 20 with SBA/CGA , 6 inch box toe Taps 4x20 with CGA      Knee/Hip Exercises: Seated   Long Arc Quad Strengthening;Both;10 reps;3 sets   Con-wayLong Arc Quad Weight 3  lbs.   Sit to Sand 2 sets;10 reps;with UE support  on Mat table at knee Ht. CGA and cues for center of gravity                  PT Short Term Goals - 01-29-2016 1741      PT SHORT TERM GOAL #1   Title Independent with an initial HEP.   Time 4   Period Weeks   Status New     PT SHORT TERM GOAL #2   Title Increase Berg score to 32/56.   Time 4   Period Weeks   Status New           PT Long Term Goals - 2016/01/29 1742      PT LONG TERM GOAL #1   Title Independent with an advanced HEP.   Time 8   Period Weeks   Status New     PT LONG TERM GOAL #2   Title Increase bilateral LE strength to a solid 4+/5 to increase stability for functional activites.   Time 8   Period Weeks   Status New     PT LONG  TERM GOAL #3   Title Walk in clinic with QC 500 feet with supervision only.   Time 8   Period Weeks   Status New     PT LONG TERM GOAL #4   Title Perform a reciprocating stair gait with one railing.   Time 8   Period Weeks     PT LONG TERM GOAL #5   Title Increase Berg score to 46-47/56.   Time 8   Period Weeks   Status New               Plan - 01/05/16 1546    Clinical Impression Statement  Pt arrived to clinic today without an assistive device and his wife states he doesn't use it at home either. He did fairly well today with FWW x300 ft. with SBA and VC's to stand erect. Pt was advised to use walker at home and bring it to next Rx. Pt did fair with balanc act.'s, but was challenged the most with sit to stand. He needs practice pulling his feet back and leaning forward at hips to prevent falling backwards. Goals are ongoing . Be with pt at all times   Rehab Potential Fair   PT Frequency 3x / week   PT Duration 8 weeks   PT Treatment/Interventions ADLs/Self Care Home Management;Functional mobility training;Stair training;Gait training;Therapeutic activities;Therapeutic exercise;Balance training;Neuromuscular re-education;Patient/family education   PT Next Visit Plan Nustep.  Balance and gait training.  Please gait train with a walker in clinic.  Bilateral LE strengthening.   Consulted and Agree with Plan of Care Patient;Family member/caregiver      Patient will benefit from skilled therapeutic intervention in order to improve the following deficits and impairments:  Abnormal gait, Decreased activity tolerance, Decreased balance, Decreased coordination, Decreased endurance, Decreased mobility, Decreased strength, Difficulty walking, Pain  Visit Diagnosis: Ataxic gait  Other abnormalities of gait and mobility  Muscle weakness (generalized)       G-Codes - 2016-01-29 1751    Functional Assessment Tool Used Clinical judgement.   Functional Limitation Mobility: Walking  and moving around   Mobility: Walking and Moving Around Goal Status [phone]) At least 20 percent but less than 40 percent impaired, limited or restricted      Problem List Patient Active Problem List   Diagnosis Date Noted  . Atrial fibrillation (  HCC) 10/27/2015  . Uncoordinated movements 02/24/2014  . Unspecified hereditary and idiopathic peripheral neuropathy 02/24/2013  . Low back pain 02/24/2013  . Abnormality of gait 02/09/2013  . Memory loss, short term 12/05/2011  . LUMBAR STRAIN, ACUTE 05/05/2010  . OSA (obstructive sleep apnea) 09/14/2009  . BPH (benign prostatic hyperplasia) 07/27/2009  . CARDIOVASCULAR STUDIES, ABNORMAL 07/27/2009  . CHEST PAIN 06/08/2009  . OSTEOARTHRITIS 05/30/2009  . HEEL PAIN, LEFT 05/24/2009  . Dyspnea 05/24/2009  . ADJUSTMENT DISORDER WITH MIXED FEATURES 08/25/2008  . Diabetes type 2, uncontrolled (HCC) 02/24/2008  . ERECTILE DYSFUNCTION 02/10/2007  . Hyperlipidemia 10/09/2006  . OBESITY 10/09/2006  . Essential hypertension 10/09/2006  . Coronary atherosclerosis 10/09/2006  . GERD 10/09/2006  . DM (diabetes mellitus) type II uncontrolled with retinopathy 06/10/2006    RAMSEUR,CHRIS, PTA 01/05/2016, 4:29 PM  [facility] [address] [place], [place], [address] Phone: [phone]   Fax:  [phone]  Name: KRESTON AHRENDT MRN: [id number] Date of Birth: 04-23-1945

## 2016-01-09 ENCOUNTER — Ambulatory Visit: Payer: Medicare Other | Admitting: Physical Therapy

## 2016-01-09 DIAGNOSIS — M6281 Muscle weakness (generalized): Secondary | ICD-10-CM | POA: Diagnosis not present

## 2016-01-09 DIAGNOSIS — R260 Ataxic gait: Secondary | ICD-10-CM | POA: Diagnosis not present

## 2016-01-09 DIAGNOSIS — R2689 Other abnormalities of gait and mobility: Secondary | ICD-10-CM | POA: Diagnosis not present

## 2016-01-09 NOTE — Therapy (Signed)
[name] [address] [place], [place], [address] Phone: [phone]   Fax:  [phone]  Physical Therapy Treatment  Patient Details  Name: Luis Yu MRN: [id number] Date of Birth: 01-22-1946 Referring Provider: Shirline Frees MD.  Encounter Date: 01/09/2016      PT End of Session - 01/09/16 1121    Visit Number 3   Number of Visits 24   Date for PT Re-Evaluation 03/04/16   PT Start Time 1115   PT Stop Time 1205   PT Time Calculation (min) 50 min   Equipment Utilized During Treatment Gait belt   Activity Tolerance Patient tolerated treatment well;Patient limited by fatigue   Behavior During Therapy [facility] for tasks assessed/performed      Past Medical History:  Diagnosis Date  . Adjustment disorder with mixed emotional features   . Atrial flutter (HCC)   . CAD (coronary artery disease)   . Depression   . Diabetes mellitus type II, uncontrolled (HCC)   . Diabetic peripheral neuropathy (HCC)   . Erectile dysfunction   . GERD (gastroesophageal reflux disease)   . Herpes zoster   . Hyperlipidemia   . Hypertension   . Obesity   . Osteoarthritis   . Postherpetic neuralgia   . Sleep apnea     Past Surgical History:  Procedure Laterality Date  . CARDIOVERSION N/A 11/10/2015   Procedure: CARDIOVERSION;  Surgeon: Laurey Morale, MD;  Location: [facility] ENDOSCOPY;  Service: Cardiovascular;  Laterality: N/A;  . CORONARY ARTERY BYPASS GRAFT  1996   eith a LIMA to the LAD, saphenous vein graft to the acute marginal, saphenous vein graft to the PDA and saphenous vein graft to the circumflex.  [name] KNEE ARTHROSCOPY  05/2004   right knee  . REPLACEMENT TOTAL KNEE BILATERAL    . SHOULDER SURGERY      There were no vitals filed for this visit.      Subjective Assessment - 01/09/16 1117    Subjective Patient presents using a rollator walker. He reports he is and he isn't using his walker at home. He also reports one fall since his last  visit. He was trasferring out of bed to go to the kitchen and slipped from sitting to the floor and landed on his bottom. He reports no injury.   Pertinent History Recent falls.  Peripheral neuropathy.   Limitations Walking   Patient Stated Goals Walk better and improvebalance.   Currently in Pain? No/denies                         [facility] Adult PT Treatment/Exercise - 01/09/16 0001      Ambulation/Gait   Ambulation/Gait Yes   Ambulation/Gait Assistance 6: Modified independent (Device/Increase time)   Ambulation Distance (Feet) 362 Feet   Assistive device Rollator   Gait Pattern Decreased dorsiflexion - right;Decreased dorsiflexion - left;Decreased step length - right;Decreased step length - left   Ambulation Surface Level;Indoor     Knee/Hip Exercises: Doctor, hospital Both;1 rep;30 seconds  in walker   Other Knee/Hip Stretches seated gastroc with toe on wheel of walker; heel on floor     Knee/Hip Exercises: Aerobic   Nustep Level 5 x 11   minutes.  02 sat= 96%.     Knee/Hip Exercises: Standing   Heel Raises Both;2 sets;10 reps   Other Standing Knee Exercises attempted toe raises; limited ROM     Knee/Hip Exercises: Seated   Sit to  Sand 2 sets;10 reps;with UE support  on Mat table at knee Ht; seated toe raises 2x10             Balance Exercises - 01/09/16 1141      Balance Exercises: Standing   Standing Eyes Opened Wide (BOA);Foam/compliant surface;30 secs;Head turns  multiple reps; attempted marching too difficult   Step Ups 6 inch;UE support 2  toe taps 2 x 10             PT Short Term Goals - 01/04/16 1741      PT SHORT TERM GOAL #1   Title Independent with an initial HEP.   Time 4   Period Weeks   Status New     PT SHORT TERM GOAL #2   Title Increase Berg score to 32/56.   Time 4   Period Weeks   Status New           PT Long Term Goals - 01/04/16 1742      PT LONG TERM GOAL #1   Title Independent with an  advanced HEP.   Time 8   Period Weeks   Status New     PT LONG TERM GOAL #2   Title Increase bilateral LE strength to a solid 4+/5 to increase stability for functional activites.   Time 8   Period Weeks   Status New     PT LONG TERM GOAL #3   Title Walk in clinic with QC 500 feet with supervision only.   Time 8   Period Weeks   Status New     PT LONG TERM GOAL #4   Title Perform a reciprocating stair gait with one railing.   Time 8   Period Weeks     PT LONG TERM GOAL #5   Title Increase Berg score to 46-47/56.   Time 8   Period Weeks   Status New               Plan - 01/09/16 1210    Clinical Impression Statement Patient presented in clinic today using his rollator walker. He did very well with amb requiring minimal cues to stand upright in walker. Additionally he did not require VCs to lock walker prior to sit <> stand transfers. He has tighness in B gastroc/solues R >L which is likely affecting his decreased DF during gait. He did fairly well with balance activities on foam surface with minimal loss of balance until asked to do high knees. Then patient loses balance posteriorly. Patient also reported he was going to MD re: his R great toe which is red from MTP joint distally. He stubbed the toe on some furniture a few weeks ago. Goals are ongoing.   Rehab Potential Fair   PT Frequency 3x / week   PT Duration 8 weeks   PT Treatment/Interventions ADLs/Self Care Home Management;Functional mobility training;Stair training;Gait training;Therapeutic activities;Therapeutic exercise;Balance training;Neuromuscular re-education;Patient/family education   PT Next Visit Plan Nustep.  Balance and gait training.  Please gait train with a walker in clinic.  Bilateral LE strengthening.   Consulted and Agree with Plan of Care Patient      Patient will benefit from skilled therapeutic intervention in order to improve the following deficits and impairments:  Abnormal gait, Decreased  activity tolerance, Decreased balance, Decreased coordination, Decreased endurance, Decreased mobility, Decreased strength, Difficulty walking, Pain  Visit Diagnosis: Ataxic gait     Problem List Patient Active Problem List   Diagnosis Date Noted  .  Atrial fibrillation (HCC) 10/27/2015  . Uncoordinated movements 02/24/2014  . Unspecified hereditary and idiopathic peripheral neuropathy 02/24/2013  . Low back pain 02/24/2013  . Abnormality of gait 02/09/2013  . Memory loss, short term 12/05/2011  . LUMBAR STRAIN, ACUTE 05/05/2010  . OSA (obstructive sleep apnea) 09/14/2009  . BPH (benign prostatic hyperplasia) 07/27/2009  . CARDIOVASCULAR STUDIES, ABNORMAL 07/27/2009  . CHEST PAIN 06/08/2009  . OSTEOARTHRITIS 05/30/2009  . HEEL PAIN, LEFT 05/24/2009  . Dyspnea 05/24/2009  . ADJUSTMENT DISORDER WITH MIXED FEATURES 08/25/2008  . Diabetes type 2, uncontrolled (HCC) 02/24/2008  . ERECTILE DYSFUNCTION 02/10/2007  . Hyperlipidemia 10/09/2006  . OBESITY 10/09/2006  . Essential hypertension 10/09/2006  . Coronary atherosclerosis 10/09/2006  . GERD 10/09/2006  . DM (diabetes mellitus) type II uncontrolled with retinopathy 06/10/2006    Solon Palm PT 01/09/2016, 12:20 PM  [name] [address] [place], [place], [address] Phone: [phone]   Fax:  [phone]  Name: TAITUM ALMS MRN: [id number] Date of Birth: Dec 15, 1945

## 2016-01-10 ENCOUNTER — Encounter: Payer: Self-pay | Admitting: Adult Health

## 2016-01-10 ENCOUNTER — Ambulatory Visit (INDEPENDENT_AMBULATORY_CARE_PROVIDER_SITE_OTHER): Payer: Medicare Other | Admitting: Adult Health

## 2016-01-10 VITALS — BP 126/80 | Temp 98.2°F | Ht 75.0 in | Wt 237.4 lb

## 2016-01-10 DIAGNOSIS — S90414S Abrasion, right lesser toe(s), sequela: Secondary | ICD-10-CM

## 2016-01-10 DIAGNOSIS — E1140 Type 2 diabetes mellitus with diabetic neuropathy, unspecified: Secondary | ICD-10-CM

## 2016-01-10 DIAGNOSIS — I25810 Atherosclerosis of coronary artery bypass graft(s) without angina pectoris: Secondary | ICD-10-CM | POA: Diagnosis not present

## 2016-01-10 DIAGNOSIS — Z794 Long term (current) use of insulin: Secondary | ICD-10-CM

## 2016-01-10 DIAGNOSIS — E1165 Type 2 diabetes mellitus with hyperglycemia: Secondary | ICD-10-CM

## 2016-01-10 DIAGNOSIS — IMO0002 Reserved for concepts with insufficient information to code with codable children: Secondary | ICD-10-CM

## 2016-01-10 LAB — POCT GLYCOSYLATED HEMOGLOBIN (HGB A1C): Hemoglobin A1C: 7

## 2016-01-10 MED ORDER — DOXYCYCLINE HYCLATE 100 MG PO CAPS: 100.0000 mg | ORAL_CAPSULE | Freq: Two times a day (BID) | ORAL | 0 refills | Status: DC

## 2016-01-10 NOTE — Progress Notes (Signed)
Subjective:    Patient ID: Luis Yu, male    DOB: [birth year], 70 y.o.   MRN: [id number]  HPI   70 year old male who returns of follow up regarding diabetes mellitus and a abrasion to his left foot. He presents with his wife to this visit. She informs me that Luis Yu had an episode of hypoglycemia where his blood sugar was 42. He was on the couch and not responsive. She was able to get him to drink some sugar water and his blood sugar came back up. She reports that Luis Yu is not checking his blood sugars as he should be and believes that he took too much insulin. Lean denies this.   He does report that he cut his second toe on his right foot on a piece of furniture. This incident happened about 2 days ago. He feels as though the toe is infected. Does complain of slight pain in his toe.   His last A1c was done in February and was 9.7   He is currently doing PT and feels as though this is going well.    Review of Systems  Constitutional: Negative.   HENT: Negative.   Respiratory: Negative.   Musculoskeletal: Positive for gait problem.  Skin: Positive for color change and wound.  All other systems reviewed and are negative.  Past Medical History:  Diagnosis Date  . Adjustment disorder with mixed emotional features   . Atrial flutter (HCC)   . CAD (coronary artery disease)   . Depression   . Diabetes mellitus type II, uncontrolled (HCC)   . Diabetic peripheral neuropathy (HCC)   . Erectile dysfunction   . GERD (gastroesophageal reflux disease)   . Herpes zoster   . Hyperlipidemia   . Hypertension   . Obesity   . Osteoarthritis   . Postherpetic neuralgia   . Sleep apnea     Social History   Social History  . Marital status: Married    Spouse name: Luis Yu  . Number of children: 1  . Years of education: 68   Occupational History  . Retired Naval architect Retired   Social History Main Topics  . Smoking status: Never Smoker  . Smokeless tobacco: Never Used  .  Alcohol use 1.2 oz/week    2 Standard drinks or equivalent per week     Comment: twice a month.  . Drug use: No  . Sexual activity: Yes    Partners: Female   Other Topics Concern  . Not on file   Social History Narrative   Married Luis Yu)   Regular exercise: none   Caffeine use: cup of coffee daily    Retired Naval architect   Caffeine- twice daily             Past Surgical History:  Procedure Laterality Date  . CARDIOVERSION N/A 11/10/2015   Procedure: CARDIOVERSION;  Surgeon: Laurey Morale, MD;  Location: [facility] ENDOSCOPY;  Service: Cardiovascular;  Laterality: N/A;  . CORONARY ARTERY BYPASS GRAFT  1996   eith a LIMA to the LAD, saphenous vein graft to the acute marginal, saphenous vein graft to the PDA and saphenous vein graft to the circumflex.  [name] KNEE ARTHROSCOPY  05/2004   right knee  . REPLACEMENT TOTAL KNEE BILATERAL    . SHOULDER SURGERY      Family History  Problem Relation Age of Onset  . Coronary artery disease Mother     deceased at 24  . Coronary artery disease Father  died age 71  . Stroke      half brother  . Stroke Brother     No Known Allergies  Current Outpatient Prescriptions on File Prior to Visit  Medication Sig Dispense Refill  . apixaban (ELIQUIS) 5 MG TABS tablet Take 1 tablet (5 mg total) by mouth 2 (two) times daily. 60 tablet 11  . aspirin EC 81 MG tablet Take 1 tablet (81 mg total) by mouth daily.    [name]. atorvastatin (LIPITOR) 80 MG tablet Take 1 tablet (80 mg total) by mouth daily. 90 tablet 1  . Cyanocobalamin (B-12) 5000 MCG SUBL Place 1 tablet under the tongue once a week.    . fluticasone (FLONASE) 50 MCG/ACT nasal spray Place 2 sprays into both nostrils daily as needed for allergies.     [name]. gabapentin (NEURONTIN) 300 MG capsule Take 600 mg by mouth 2 (two) times daily.    [name]. glipiZIDE (GLUCOTROL) 10 MG tablet Take 1 tablet (10 mg total) by mouth 2 (two) times daily before a meal. 180 tablet 3  . glucose blood (ACCU-CHEK ACTIVE STRIPS)  test strip Use as instructed 100 each 12  . Insulin Pen Needle (PEN NEEDLES 31GX5/16") 31G X 8 MM MISC Use to inject insulin 5 times a day. 100 each 11  . isosorbide mononitrate (IMDUR) 30 MG 24 hr tablet Take 1 tablet (30 mg total) by mouth daily. 30 tablet 12  . Lancets (ACCU-CHEK MULTICLIX) lancets Use up to 5 times a day to check blood sugar. 100 each 5  . lisinopril-hydrochlorothiazide (PRINZIDE,ZESTORETIC) 20-12.5 MG tablet TAKE 1 TABLET BY MOUTH EVERY DAY 90 tablet 1  . metFORMIN (GLUCOPHAGE) 1000 MG tablet Take 1 tablet (1,000 mg total) by mouth 2 (two) times daily with a meal. 180 tablet 3  . Nebivolol HCl 20 MG TABS Take 1 tablet (20 mg total) by mouth daily. 90 tablet 1  . NOVOLOG FLEXPEN 100 UNIT/ML FlexPen USE 15 TO 30 UNITS 15 MINUTES BEFORE MEALS 3 TIMES DAILY 5 pen 0  . omeprazole (PRILOSEC) 20 MG capsule TAKE ONE CAPSULE TWICE A DAY BEFORE A MEAL 180 capsule 0  . TOUJEO SOLOSTAR 300 UNIT/ML SOPN INJECT 30 UNITS INTO THE SKIN AT BEDTIME. 4.5 mL 1   Current Facility-Administered Medications on File Prior to Visit  Medication Dose Route Frequency Provider Last Rate Last Dose  . pneumococcal 13-valent conjugate vaccine (PREVNAR 13) injection 0.5 mL  0.5 mL Intramuscular Tomorrow-1000 Sheron Robin, NP        BP 126/80   Temp 98.2 F (36.8 C) (Oral)   Ht 6\' 3"  (1.905 m)   Wt 237 lb 6 oz (107.7 kg)   BMI 29.67 kg/m       Objective:   Physical Exam  Constitutional: He is oriented to person, place, and time. He appears well-developed and well-nourished. No distress.  Cardiovascular: Normal rate, regular rhythm, normal heart sounds and intact distal pulses.  Exam reveals no gallop and no friction rub.   No murmur heard. Pulmonary/Chest: Effort normal and breath sounds normal. No respiratory distress. He has no wheezes. He has no rales. He exhibits no tenderness.  Neurological: He is alert and oriented to person, place, and time.  Skin: Skin is warm and dry. No rash noted. He  is not diaphoretic. No erythema. No pallor.  Small abrasion on right second toe. Slight redness and pain with palpation. No discharge or warmth.   Psychiatric: He has a normal mood and affect. His behavior is normal. Judgment  and thought content normal.  Nursing note and vitals reviewed.     Assessment & Plan:  1. Uncontrolled type 2 diabetes mellitus with diabetic neuropathy, with long-term current use of insulin (HCC) - POC HgB A1c - 7.0 - Better improvement in glucose control.  - I am going to have him check his sugars twice a day and write down in log. Bring log and meter to visit in two weeks  2. Toe abrasion, right, sequela - Does not appear acutely infected but will cover with doxycycline. Apply antibiotic ointment on wound and cover with bandage daily.  - doxycycline (VIBRAMYCIN) 100 MG capsule; Take 1 capsule (100 mg total) by mouth 2 (two) times daily.  Dispense: 20 capsule; Refill: 0 - Follow up if no improvement  Shirline Frees, NP

## 2016-01-10 NOTE — Patient Instructions (Addendum)
Please start monitoring your blood sugars more carefully.   Your A1c is 7.0. Your last one was 9.7   I want you to check your blood sugars twice a day, write them into a log and bring the log and monitor to your visit in two weeks.   Follow up sooner if needed

## 2016-01-11 ENCOUNTER — Encounter: Payer: Self-pay | Admitting: Physical Therapy

## 2016-01-11 ENCOUNTER — Ambulatory Visit: Payer: Medicare Other | Admitting: Physical Therapy

## 2016-01-11 DIAGNOSIS — R260 Ataxic gait: Secondary | ICD-10-CM | POA: Diagnosis not present

## 2016-01-11 DIAGNOSIS — R2689 Other abnormalities of gait and mobility: Secondary | ICD-10-CM | POA: Diagnosis not present

## 2016-01-11 DIAGNOSIS — M6281 Muscle weakness (generalized): Secondary | ICD-10-CM

## 2016-01-11 NOTE — Therapy (Signed)
[name] [address] [place], [place], [address] Phone: [phone]   Fax:  [phone]  Physical Therapy Treatment  Patient Details  Name: Luis Yu MRN: [id number] Date of Birth: 19-Dec-1945 Referring Provider: Shirline Frees MD.  Encounter Date: 01/11/2016      PT End of Session - 01/11/16 1437    Visit Number 4   Number of Visits 24   Date for PT Re-Evaluation 03/04/16   PT Start Time 1357   PT Stop Time 1438   PT Time Calculation (min) 41 min   Activity Tolerance Patient tolerated treatment well   Behavior During Therapy [name] for tasks assessed/performed      Past Medical History:  Diagnosis Date  . Adjustment disorder with mixed emotional features   . Atrial flutter (HCC)   . CAD (coronary artery disease)   . Depression   . Diabetes mellitus type II, uncontrolled (HCC)   . Diabetic peripheral neuropathy (HCC)   . Erectile dysfunction   . GERD (gastroesophageal reflux disease)   . Herpes zoster   . Hyperlipidemia   . Hypertension   . Obesity   . Osteoarthritis   . Postherpetic neuralgia   . Sleep apnea     Past Surgical History:  Procedure Laterality Date  . CARDIOVERSION N/A 11/10/2015   Procedure: CARDIOVERSION;  Surgeon: Laurey Morale, MD;  Location: [facility] ENDOSCOPY;  Service: Cardiovascular;  Laterality: N/A;  . CORONARY ARTERY BYPASS GRAFT  1996   eith a LIMA to the LAD, saphenous vein graft to the acute marginal, saphenous vein graft to the PDA and saphenous vein graft to the circumflex.  [name] KNEE ARTHROSCOPY  05/2004   right knee  . REPLACEMENT TOTAL KNEE BILATERAL    . SHOULDER SURGERY      There were no vitals filed for this visit.      Subjective Assessment - 01/11/16 1400    Subjective Patient reported doing well after last treatment and no complaints today   Pertinent History Recent falls.  Peripheral neuropathy.   Limitations Walking   Patient Stated Goals Walk better and improvebalance.   Currently in Pain? No/denies                         [facility] Adult PT Treatment/Exercise - 01/11/16 0001      Knee/Hip Exercises: Aerobic   Nustep 11 min L5 UE/LE, monitored for progression     Knee/Hip Exercises: Seated   Long Arc Quad Strengthening;Both;10 reps;3 sets   [place] Weight 3 lbs.   Hamstring Curl Strengthening;Both;3 sets;10 reps  green t-band     Knee/Hip Exercises: Supine   Bridges Limitations x10   Straight Leg Raises Strengthening;Both  2x10             Balance Exercises - 01/11/16 1416      Balance Exercises: Standing   Standing Eyes Opened Wide (BOA);Foam/compliant surface;Narrow base of support (BOS)  each   Step Ups 6 inch;UE support 2  2x10   Partial Tandem Stance Eyes open;Intermittent upper extremity support;2 reps   Sidestepping 5 reps  in parallel bars 5 each way   Marching Limitations 2x10   Sit to Stand Time x10 with UE support             PT Short Term Goals - 01/04/16 1741      PT SHORT TERM GOAL #1   Title Independent with an initial HEP.   Time 4  Period Weeks   Status New     PT SHORT TERM GOAL #2   Title Increase Berg score to 32/56.   Time 4   Period Weeks   Status New           PT Long Term Goals - 01/04/16 1742      PT LONG TERM GOAL #1   Title Independent with an advanced HEP.   Time 8   Period Weeks   Status New     PT LONG TERM GOAL #2   Title Increase bilateral LE strength to a solid 4+/5 to increase stability for functional activites.   Time 8   Period Weeks   Status New     PT LONG TERM GOAL #3   Title Walk in clinic with QC 500 feet with supervision only.   Time 8   Period Weeks   Status New     PT LONG TERM GOAL #4   Title Perform a reciprocating stair gait with one railing.   Time 8   Period Weeks     PT LONG TERM GOAL #5   Title Increase Berg score to 46-47/56.   Time 8   Period Weeks   Status New               Plan - 01/11/16 1438     Clinical Impression Statement Patient progressing with all activities today and required a rest break during exrercises. Patient had no LOB today and tolerated strengthening and balance progression well. Patient reported no falls. Patient current goals ongoing due to strength and balance deficts.    Rehab Potential Fair   PT Frequency 3x / week   PT Duration 8 weeks   PT Treatment/Interventions ADLs/Self Care Home Management;Functional mobility training;Stair training;Gait training;Therapeutic activities;Therapeutic exercise;Balance training;Neuromuscular re-education;Patient/family education   PT Next Visit Plan Nustep.  Balance and gait training.  Please gait train with a walker in clinic.  Bilateral LE strengthening.   Consulted and Agree with Plan of Care Patient      Patient will benefit from skilled therapeutic intervention in order to improve the following deficits and impairments:  Abnormal gait, Decreased activity tolerance, Decreased balance, Decreased coordination, Decreased endurance, Decreased mobility, Decreased strength, Difficulty walking, Pain  Visit Diagnosis: Ataxic gait  Muscle weakness (generalized)  Other abnormalities of gait and mobility     Problem List Patient Active Problem List   Diagnosis Date Noted  . Atrial fibrillation (HCC) 10/27/2015  . Uncoordinated movements 02/24/2014  . Unspecified hereditary and idiopathic peripheral neuropathy 02/24/2013  . Low back pain 02/24/2013  . Abnormality of gait 02/09/2013  . Memory loss, short term 12/05/2011  . LUMBAR STRAIN, ACUTE 05/05/2010  . OSA (obstructive sleep apnea) 09/14/2009  . BPH (benign prostatic hyperplasia) 07/27/2009  . CARDIOVASCULAR STUDIES, ABNORMAL 07/27/2009  . CHEST PAIN 06/08/2009  . OSTEOARTHRITIS 05/30/2009  . HEEL PAIN, LEFT 05/24/2009  . Dyspnea 05/24/2009  . ADJUSTMENT DISORDER WITH MIXED FEATURES 08/25/2008  . Diabetes type 2, uncontrolled (HCC) 02/24/2008  . ERECTILE DYSFUNCTION  02/10/2007  . Hyperlipidemia 10/09/2006  . OBESITY 10/09/2006  . Essential hypertension 10/09/2006  . Coronary atherosclerosis 10/09/2006  . GERD 10/09/2006  . DM (diabetes mellitus) type II uncontrolled with retinopathy 06/10/2006    Camauri Fleece P, PTA 01/11/2016, 2:40 PM  [name] [address] [place], [place], [address] Phone: [phone]   Fax:  [phone]  Name: Doretha ImusDonald W Nedved MRN: [id number] Date of Birth: 11-Jul-1945

## 2016-01-12 ENCOUNTER — Other Ambulatory Visit: Payer: Self-pay | Admitting: Internal Medicine

## 2016-01-12 ENCOUNTER — Encounter: Payer: Medicare Other | Admitting: Physical Therapy

## 2016-01-16 ENCOUNTER — Ambulatory Visit: Payer: Medicare Other | Admitting: Physical Therapy

## 2016-01-16 ENCOUNTER — Encounter: Payer: Self-pay | Admitting: Physical Therapy

## 2016-01-16 DIAGNOSIS — R2689 Other abnormalities of gait and mobility: Secondary | ICD-10-CM

## 2016-01-16 DIAGNOSIS — R260 Ataxic gait: Secondary | ICD-10-CM

## 2016-01-16 DIAGNOSIS — M6281 Muscle weakness (generalized): Secondary | ICD-10-CM | POA: Diagnosis not present

## 2016-01-16 NOTE — Therapy (Signed)
[name] [address] [place], [place], [address] Phone: [phone]   Fax:  [phone]  Physical Therapy Treatment  Patient Details  Name: Luis ImusDonald W Lasecki MRN: [id number] Date of Birth: 03/10/1946 Referring Provider: Shirline Freesory Nafziger MD.  Encounter Date: 01/16/2016      PT End of Session - 01/16/16 1428    Visit Number 5   Number of Visits 24   Date for PT Re-Evaluation 03/04/16   PT Start Time 1358   PT Stop Time 1439   PT Time Calculation (min) 41 min   Activity Tolerance Patient tolerated treatment well;Patient limited by fatigue   Behavior During Therapy [facility] for tasks assessed/performed      Past Medical History:  Diagnosis Date  . Adjustment disorder with mixed emotional features   . Atrial flutter (HCC)   . CAD (coronary artery disease)   . Depression   . Diabetes mellitus type II, uncontrolled (HCC)   . Diabetic peripheral neuropathy (HCC)   . Erectile dysfunction   . GERD (gastroesophageal reflux disease)   . Herpes zoster   . Hyperlipidemia   . Hypertension   . Obesity   . Osteoarthritis   . Postherpetic neuralgia   . Sleep apnea     Past Surgical History:  Procedure Laterality Date  . CARDIOVERSION N/A 11/10/2015   Procedure: CARDIOVERSION;  Surgeon: Laurey Moralealton S McLean, MD;  [facility];  Service: Cardiovascular;  Laterality: N/A;  . CORONARY ARTERY BYPASS GRAFT  1996   eith a LIMA to the LAD, saphenous vein graft to the acute marginal, saphenous vein graft to the PDA and saphenous vein graft to the circumflex.  [name]. KNEE ARTHROSCOPY  05/2004   right knee  . REPLACEMENT TOTAL KNEE BILATERAL    . SHOULDER SURGERY      There were no vitals filed for this visit.      Subjective Assessment - 01/16/16 1359    Subjective Patient arrived fatigue and out of breath just walking from front of office to treatment area, patient was able to rest and vitals were taken and all WNL   Patient is accompained by:  Family member   Pertinent History Recent falls.  Peripheral neuropathy.   Limitations Walking   Patient Stated Goals Walk better and improvebalance.   Currently in Pain? No/denies                         [facility] Adult PT Treatment/Exercise - 01/16/16 0001      Knee/Hip Exercises: Aerobic   Nustep 10min with UE/LE activity, monitored for progression     Knee/Hip Exercises: Seated   Long Arc Quad Strengthening;Both;10 reps;3 sets   Con-wayLong Arc Quad Weight 3 lbs.   Ball Squeeze 3x10   Hamstring Curl Strengthening;Both;3 sets;10 reps  green t-band     Knee/Hip Exercises: Supine   Bridges Limitations 2x10    Straight Leg Raises Strengthening;Both  2x10   Other Supine Knee/Hip Exercises hip abd with green t-band 2x10             Balance Exercises - 01/16/16 1417      Balance Exercises: Seated   Static Sitting Eyes opened;Foam/compliant surface;Time  2min   Dynamic Sitting Eyes opened;Reaching outside base of support;Anterior/posterior weight shift;Lateral weight shift  sitting on bosu, patient had very weak core and required CGA   Heel Raises Both  2x10 CGA             PT Short  Term Goals - 01/04/16 1741      PT SHORT TERM GOAL #1   Title Independent with an initial HEP.   Time 4   Period Weeks   Status New     PT SHORT TERM GOAL #2   Title Increase Berg score to 32/56.   Time 4   Period Weeks   Status New           PT Long Term Goals - 01/04/16 1742      PT LONG TERM GOAL #1   Title Independent with an advanced HEP.   Time 8   Period Weeks   Status New     PT LONG TERM GOAL #2   Title Increase bilateral LE strength to a solid 4+/5 to increase stability for functional activites.   Time 8   Period Weeks   Status New     PT LONG TERM GOAL #3   Title Walk in clinic with QC 500 feet with supervision only.   Time 8   Period Weeks   Status New     PT LONG TERM GOAL #4   Title Perform a reciprocating stair gait with one railing.    Time 8   Period Weeks     PT LONG TERM GOAL #5   Title Increase Berg score to 46-47/56.   Time 8   Period Weeks   Status New               Plan - 01/16/16 1432    Clinical Impression Statement Patient progressing with all activities slowly due to ongoing fatigue and balance deficts. Patient reported a fall at home a few days ago when he lost balance in bedroom fell backward into wall, no injury reported except bruise per patient wife. Patient did fairly well today although reqired rest and CGA throughout to avoid LOB. Patient required CGA with seated balance on BOSU due to core weakness and balance deficts. Patient wife reported the patient not using walker at home and not doing ant exercises or walking. Educated patient and wife for walking around house with walker to build up activity tolerance and for safety. Patient current goals ongoing due to strength and balance limitations.   Rehab Potential Fair   PT Frequency 3x / week   PT Duration 8 weeks   PT Treatment/Interventions ADLs/Self Care Home Management;Functional mobility training;Stair training;Gait training;Therapeutic activities;Therapeutic exercise;Balance training;Neuromuscular re-education;Patient/family education   PT Next Visit Plan Nustep.  Balance and gait training.  Please gait train with a walker in clinic.  Bilateral LE strengthening.   PT Home Exercise Plan issue HEP   Consulted and Agree with Plan of Care Patient      Patient will benefit from skilled therapeutic intervention in order to improve the following deficits and impairments:  Abnormal gait, Decreased activity tolerance, Decreased balance, Decreased coordination, Decreased endurance, Decreased mobility, Decreased strength, Difficulty walking, Pain  Visit Diagnosis: Ataxic gait  Muscle weakness (generalized)  Other abnormalities of gait and mobility     Problem List Patient Active Problem List   Diagnosis Date Noted  . Atrial fibrillation  (HCC) 10/27/2015  . Uncoordinated movements 02/24/2014  . Unspecified hereditary and idiopathic peripheral neuropathy 02/24/2013  . Low back pain 02/24/2013  . Abnormality of gait 02/09/2013  . Memory loss, short term 12/05/2011  . LUMBAR STRAIN, ACUTE 05/05/2010  . OSA (obstructive sleep apnea) 09/14/2009  . BPH (benign prostatic hyperplasia) 07/27/2009  . CARDIOVASCULAR STUDIES, ABNORMAL 07/27/2009  . CHEST PAIN 06/08/2009  .  OSTEOARTHRITIS 05/30/2009  . HEEL PAIN, LEFT 05/24/2009  . Dyspnea 05/24/2009  . ADJUSTMENT DISORDER WITH MIXED FEATURES 08/25/2008  . Diabetes type 2, uncontrolled (HCC) 02/24/2008  . ERECTILE DYSFUNCTION 02/10/2007  . Hyperlipidemia 10/09/2006  . OBESITY 10/09/2006  . Essential hypertension 10/09/2006  . Coronary atherosclerosis 10/09/2006  . GERD 10/09/2006  . DM (diabetes mellitus) type II uncontrolled with retinopathy 06/10/2006    Bryceton Hantz P, PTA 01/16/2016, 2:45 PM  [name] [address] [place], [place], [address] Phone: [phone]   Fax:  [phone]  Name: Luis ImusDonald W Brevik MRN: [id number] Date of Birth: 19-Mar-1946

## 2016-01-18 ENCOUNTER — Ambulatory Visit: Payer: Medicare Other | Attending: Adult Health | Admitting: Physical Therapy

## 2016-01-18 DIAGNOSIS — R260 Ataxic gait: Secondary | ICD-10-CM | POA: Diagnosis not present

## 2016-01-18 DIAGNOSIS — R2689 Other abnormalities of gait and mobility: Secondary | ICD-10-CM | POA: Diagnosis not present

## 2016-01-18 DIAGNOSIS — M6281 Muscle weakness (generalized): Secondary | ICD-10-CM | POA: Insufficient documentation

## 2016-01-18 NOTE — Therapy (Signed)
[name] [address] [place], [place], [address] Phone: [phone]   Fax:  [phone]  Physical Therapy Treatment  Patient Details  Name: Luis Yu MRN: [id number] Date of Birth: [birth year] Referring Provider: Shirline Freesory Nafziger MD.  Encounter Date: 01/18/2016      PT End of Session - 01/18/16 1428    Visit Number 6   Number of Visits 24   Date for PT Re-Evaluation 03/04/16   PT Start Time 1347   PT Stop Time 1427   PT Time Calculation (min) 40 min   Activity Tolerance Patient tolerated treatment well   Behavior During Therapy [facility] for tasks assessed/performed      Past Medical History:  Diagnosis Date  . Adjustment disorder with mixed emotional features   . Atrial flutter (HCC)   . CAD (coronary artery disease)   . Depression   . Diabetes mellitus type II, uncontrolled (HCC)   . Diabetic peripheral neuropathy (HCC)   . Erectile dysfunction   . GERD (gastroesophageal reflux disease)   . Herpes zoster   . Hyperlipidemia   . Hypertension   . Obesity   . Osteoarthritis   . Postherpetic neuralgia   . Sleep apnea     Past Surgical History:  Procedure Laterality Date  . CARDIOVERSION N/A 11/10/2015   Procedure: CARDIOVERSION;  Surgeon: Laurey Moralealton S McLean, MD;  [facility];  Service: Cardiovascular;  Laterality: N/A;  . CORONARY ARTERY BYPASS GRAFT  1996   eith a LIMA to the LAD, saphenous vein graft to the acute marginal, saphenous vein graft to the PDA and saphenous vein graft to the circumflex.  [name]. KNEE ARTHROSCOPY  05/2004   right knee  . REPLACEMENT TOTAL KNEE BILATERAL    . SHOULDER SURGERY      There were no vitals filed for this visit.      Subjective Assessment - 01/18/16 1348    Subjective feeling much better today    Pertinent History Recent falls.  Peripheral neuropathy.   Limitations Walking   Patient Stated Goals Walk better and improve balance.   Currently in Pain? No/denies                          [facility] Adult PT Treatment/Exercise - 01/18/16 1350      Knee/Hip Exercises: Aerobic   Nustep L5 x 10 min     Knee/Hip Exercises: Standing   Heel Raises Both;2 sets;10 reps   Heel Raises Limitations toe raises 2x10   Knee Flexion Both;2 sets;10 reps   Knee Flexion Limitations 3#   Hip Flexion Both;2 sets;10 reps;Knee bent   Hip Flexion Limitations 3#   Hip Abduction Both;2 sets;10 reps;Knee straight   Abduction Limitations 3#   Hip Extension Both;2 sets;10 reps;Knee straight   Extension Limitations 3#; mod cues for technique   Forward Step Up Both;1 set;10 reps;Hand Hold: 2;Step Height: 4"   Forward Step Up Limitations heavy reliance on UEs     Knee/Hip Exercises: Seated   Sit to Sand 1 set;10 reps;without UE support  min cues for ant weight shift     Knee/Hip Exercises: Supine   Hip Adduction Isometric Both;2 sets;10 reps   Bridges Limitations 2x10                   PT Short Term Goals - 01/04/16 1741      PT SHORT TERM GOAL #1   Title Independent with an initial HEP.  Time 4   Period Weeks   Status New     PT SHORT TERM GOAL #2   Title Increase Berg score to 32/56.   Time 4   Period Weeks   Status New           PT Long Term Goals - 01/04/16 1742      PT LONG TERM GOAL #1   Title Independent with an advanced HEP.   Time 8   Period Weeks   Status New     PT LONG TERM GOAL #2   Title Increase bilateral LE strength to a solid 4+/5 to increase stability for functional activites.   Time 8   Period Weeks   Status New     PT LONG TERM GOAL #3   Title Walk in clinic with QC 500 feet with supervision only.   Time 8   Period Weeks   Status New     PT LONG TERM GOAL #4   Title Perform a reciprocating stair gait with one railing.   Time 8   Period Weeks     PT LONG TERM GOAL #5   Title Increase Berg score to 46-47/56.   Time 8   Period Weeks   Status New               Plan - 01/18/16 1428     Clinical Impression Statement Pt needed seated rest breaks with standing activities today but tolerated well and improved tolerance today with activity.  Mod cues needed throughout session for technique and to slow down with exercises.  Will continue to benefit from PT to maximize function.   PT Treatment/Interventions ADLs/Self Care Home Management;Functional mobility training;Stair training;Gait training;Therapeutic activities;Therapeutic exercise;Balance training;Neuromuscular re-education;Patient/family education   PT Next Visit Plan Nustep.  Balance and gait training.  Please gait train with a walker in clinic.  Bilateral LE strengthening.   Consulted and Agree with Plan of Care Patient      Patient will benefit from skilled therapeutic intervention in order to improve the following deficits and impairments:  Abnormal gait, Decreased activity tolerance, Decreased balance, Decreased coordination, Decreased endurance, Decreased mobility, Decreased strength, Difficulty walking, Pain  Visit Diagnosis: Ataxic gait  Muscle weakness (generalized)  Other abnormalities of gait and mobility     Problem List Patient Active Problem List   Diagnosis Date Noted  . Atrial fibrillation (HCC) 10/27/2015  . Uncoordinated movements 02/24/2014  . Unspecified hereditary and idiopathic peripheral neuropathy 02/24/2013  . Low back pain 02/24/2013  . Abnormality of gait 02/09/2013  . Memory loss, short term 12/05/2011  . LUMBAR STRAIN, ACUTE 05/05/2010  . OSA (obstructive sleep apnea) 09/14/2009  . BPH (benign prostatic hyperplasia) 07/27/2009  . CARDIOVASCULAR STUDIES, ABNORMAL 07/27/2009  . CHEST PAIN 06/08/2009  . OSTEOARTHRITIS 05/30/2009  . HEEL PAIN, LEFT 05/24/2009  . Dyspnea 05/24/2009  . ADJUSTMENT DISORDER WITH MIXED FEATURES 08/25/2008  . Diabetes type 2, uncontrolled (HCC) 02/24/2008  . ERECTILE DYSFUNCTION 02/10/2007  . Hyperlipidemia 10/09/2006  . OBESITY 10/09/2006  .  Essential hypertension 10/09/2006  . Coronary atherosclerosis 10/09/2006  . GERD 10/09/2006  . DM (diabetes mellitus) type II uncontrolled with retinopathy 06/10/2006      Clarita CraneStephanie F Antonios Ostrow, PT, DPT 01/18/16 2:30 PM    [name] [address] [place], [place], [address] Phone: [phone]   Fax:  [phone]  Name: Luis Yu MRN: [id number] Date of Birth: [birth year]

## 2016-01-19 ENCOUNTER — Ambulatory Visit: Payer: Medicare Other | Admitting: Physical Therapy

## 2016-01-23 ENCOUNTER — Ambulatory Visit: Payer: Medicare Other | Admitting: Physical Therapy

## 2016-01-23 ENCOUNTER — Encounter: Payer: Self-pay | Admitting: Physical Therapy

## 2016-01-23 DIAGNOSIS — R260 Ataxic gait: Secondary | ICD-10-CM

## 2016-01-23 DIAGNOSIS — M6281 Muscle weakness (generalized): Secondary | ICD-10-CM

## 2016-01-23 DIAGNOSIS — R2689 Other abnormalities of gait and mobility: Secondary | ICD-10-CM | POA: Diagnosis not present

## 2016-01-23 NOTE — Therapy (Signed)
[name] [address] [place], [place], [address] Phone: [phone]   Fax:  [phone]  Physical Therapy Treatment  Patient Details  Name: Luis Yu MRN: [id number] Date of Birth: 04/26/1945 Referring Provider: Shirline Frees MD.  Encounter Date: 01/23/2016      PT End of Session - 01/23/16 1424    Visit Number 7   Number of Visits 24   Date for PT Re-Evaluation 03/04/16   PT Start Time 1354   PT Stop Time 1437   PT Time Calculation (min) 43 min   Activity Tolerance Patient tolerated treatment well;Patient limited by fatigue   Behavior During Therapy [facility] for tasks assessed/performed      Past Medical History:  Diagnosis Date  . Adjustment disorder with mixed emotional features   . Atrial flutter (HCC)   . CAD (coronary artery disease)   . Depression   . Diabetes mellitus type II, uncontrolled (HCC)   . Diabetic peripheral neuropathy (HCC)   . Erectile dysfunction   . GERD (gastroesophageal reflux disease)   . Herpes zoster   . Hyperlipidemia   . Hypertension   . Obesity   . Osteoarthritis   . Postherpetic neuralgia   . Sleep apnea     Past Surgical History:  Procedure Laterality Date  . CARDIOVERSION N/A 11/10/2015   Procedure: CARDIOVERSION;  Surgeon: Laurey Morale, MD;  [facility] ENDOSCOPY;  Service: Cardiovascular;  Laterality: N/A;  . CORONARY ARTERY BYPASS GRAFT  1996   eith a LIMA to the LAD, saphenous vein graft to the acute marginal, saphenous vein graft to the PDA and saphenous vein graft to the circumflex.  [name] KNEE ARTHROSCOPY  05/2004   right knee  . REPLACEMENT TOTAL KNEE BILATERAL    . SHOULDER SURGERY      There were no vitals filed for this visit.      Subjective Assessment - 01/23/16 1402    Subjective Patient arrived feeling fatigue today but "would like to get better"   Patient is accompained by: Family member   Pertinent History Recent falls.  Peripheral neuropathy.   Limitations  Walking   Patient Stated Goals Walk better and improve balance.   Currently in Pain? No/denies            [facility] PT Assessment - 01/23/16 0001      Berg Balance Test   Sit to Stand Able to stand  independently using hands   Standing Unsupported Needs several tries to stand 30 seconds unsupported   Sitting with Back Unsupported but Feet Supported on Floor or Stool Able to sit safely and securely 2 minutes   Stand to Sit Controls descent by using hands   Transfers Able to transfer safely, definite need of hands   Standing Unsupported with Eyes Closed Able to stand 3 seconds   Standing Ubsupported with Feet Together Needs help to attain position but able to stand for 30 seconds with feet together   From Standing, Reach Forward with Outstretched Arm Can reach forward >12 cm safely (5")   From Standing Position, Pick up Object from Floor Unable to pick up shoe, but reaches 2-5 cm (1-2") from shoe and balances independently   From Standing Position, Turn to Look Behind Over each Shoulder Looks behind one side only/other side shows less weight shift   Turn 360 Degrees Able to turn 360 degrees safely but slowly   Standing Unsupported, Alternately Place Feet on Step/Stool Able to complete >2 steps/needs minimal assist  Standing Unsupported, One Foot in [employer]gate PalmoliveFront Loses balance while stepping or standing   Standing on One Leg Unable to try or needs assist to prevent fall   Total Score 28                     [facility] Adult PT Treatment/Exercise - 01/23/16 0001      Knee/Hip Exercises: Aerobic   Nustep 10min L4 UE/LE monitored for progression     Knee/Hip Exercises: Standing   Heel Raises Both;2 sets;10 reps   Knee Flexion Both;2 sets;10 reps   Knee Flexion Limitations 3#   Hip Flexion Both;2 sets;10 reps;Knee bent   Hip Flexion Limitations 3#   Hip Abduction Both;2 sets;10 reps;Knee straight   Abduction Limitations 3#   Hip Extension Both;2 sets;10 reps;Knee straight   Extension  Limitations 3#     Knee/Hip Exercises: Seated   Long Arc Quad Strengthening;Both;10 reps;3 sets   Long Arc Quad Weight 3 lbs.   Sit to Sand 10 reps;without UE support;with UE support;2 sets     Knee/Hip Exercises: Supine   Bridges Limitations x10 difficulty due to out of breath   Other Supine Knee/Hip Exercises hip abd with green t-band 2x10                  PT Short Term Goals - 01/23/16 1426      PT SHORT TERM GOAL #1   Title Independent with an initial HEP.   Time 4   Period Weeks   Status On-going     PT SHORT TERM GOAL #2   Title Increase Berg score to 32/56.   Time 4   Period Weeks   Status On-going           PT Long Term Goals - 01/23/16 1427      PT LONG TERM GOAL #1   Title Independent with an advanced HEP.   Time 8   Period Weeks   Status On-going     PT LONG TERM GOAL #2   Title Increase bilateral LE strength to a solid 4+/5 to increase stability for functional activites.   Time 8   Period Weeks   Status On-going     PT LONG TERM GOAL #3   Title Walk in clinic with QC 500 feet with supervision only.   Time 8   Period Weeks   Status On-going     PT LONG TERM GOAL #4   Title Perform a reciprocating stair gait with one railing.   Period Weeks   Status On-going     PT LONG TERM GOAL #5   Title Increase Berg score to 46-47/56.   Time 8   Period Weeks   Status On-going               Plan - 01/23/16 1429    Clinical Impression Statement Patient progressing slowly due to faigue and short of breath easily and requires rest breaks yet able to recover quickly. Patient is able to tolerate all exercises supine, sitting and standing today. Patient has improved activity tolerance and BERG score by one point thus far. Patient has reported three falls since he has started therapy. Patient uses a rollator yet reports using it occasionally. Educated patient on fall prevention and activity tolerance. Patient current goals ongoing due to  balance and strength deficts.    Rehab Potential Fair   PT Frequency 3x / week   PT Duration 8 weeks   PT Treatment/Interventions ADLs/Self Care  Home Management;Functional mobility training;Stair training;Gait training;Therapeutic activities;Therapeutic exercise;Balance training;Neuromuscular re-education;Patient/family education   PT Next Visit Plan cont with POC MD. Shirline Freesory Nafziger   Consulted and Agree with Plan of Care Patient      Patient will benefit from skilled therapeutic intervention in order to improve the following deficits and impairments:  Abnormal gait, Decreased activity tolerance, Decreased balance, Decreased coordination, Decreased endurance, Decreased mobility, Decreased strength, Difficulty walking, Pain  Visit Diagnosis: Ataxic gait  Muscle weakness (generalized)  Other abnormalities of gait and mobility     Problem List Patient Active Problem List   Diagnosis Date Noted  . Atrial fibrillation (HCC) 10/27/2015  . Uncoordinated movements 02/24/2014  . Unspecified hereditary and idiopathic peripheral neuropathy 02/24/2013  . Low back pain 02/24/2013  . Abnormality of gait 02/09/2013  . Memory loss, short term 12/05/2011  . LUMBAR STRAIN, ACUTE 05/05/2010  . OSA (obstructive sleep apnea) 09/14/2009  . BPH (benign prostatic hyperplasia) 07/27/2009  . CARDIOVASCULAR STUDIES, ABNORMAL 07/27/2009  . CHEST PAIN 06/08/2009  . OSTEOARTHRITIS 05/30/2009  . HEEL PAIN, LEFT 05/24/2009  . Dyspnea 05/24/2009  . ADJUSTMENT DISORDER WITH MIXED FEATURES 08/25/2008  . Diabetes type 2, uncontrolled (HCC) 02/24/2008  . ERECTILE DYSFUNCTION 02/10/2007  . Hyperlipidemia 10/09/2006  . OBESITY 10/09/2006  . Essential hypertension 10/09/2006  . Coronary atherosclerosis 10/09/2006  . GERD 10/09/2006  . DM (diabetes mellitus) type II uncontrolled with retinopathy 06/10/2006    Miko Markwood P, PTA 01/23/2016, 2:43 PM  Cathie HoopsChristina Arnell Mausolf, PTA 01/23/16 2:43 PM  [name] [address] [place], [place], [address] Phone: [phone]   Fax:  [phone]  Name: Luis Yu MRN: [id number] Date of Birth: 21-Jul-1945

## 2016-01-24 ENCOUNTER — Encounter: Payer: Self-pay | Admitting: Adult Health

## 2016-01-24 ENCOUNTER — Ambulatory Visit (INDEPENDENT_AMBULATORY_CARE_PROVIDER_SITE_OTHER): Payer: Medicare Other | Admitting: Adult Health

## 2016-01-24 VITALS — BP 150/80 | HR 62 | Temp 97.9°F | Ht 75.0 in | Wt 237.0 lb

## 2016-01-24 DIAGNOSIS — R413 Other amnesia: Secondary | ICD-10-CM

## 2016-01-24 DIAGNOSIS — E1140 Type 2 diabetes mellitus with diabetic neuropathy, unspecified: Secondary | ICD-10-CM

## 2016-01-24 DIAGNOSIS — I25810 Atherosclerosis of coronary artery bypass graft(s) without angina pectoris: Secondary | ICD-10-CM

## 2016-01-24 DIAGNOSIS — IMO0002 Reserved for concepts with insufficient information to code with codable children: Secondary | ICD-10-CM

## 2016-01-24 DIAGNOSIS — R269 Unspecified abnormalities of gait and mobility: Secondary | ICD-10-CM

## 2016-01-24 DIAGNOSIS — R0602 Shortness of breath: Secondary | ICD-10-CM | POA: Diagnosis not present

## 2016-01-24 DIAGNOSIS — E1165 Type 2 diabetes mellitus with hyperglycemia: Secondary | ICD-10-CM

## 2016-01-24 DIAGNOSIS — Z794 Long term (current) use of insulin: Secondary | ICD-10-CM

## 2016-01-24 MED ORDER — IPRATROPIUM-ALBUTEROL 0.5-2.5 (3) MG/3ML IN SOLN: 3.0000 mL | Freq: Four times a day (QID) | RESPIRATORY_TRACT | Status: DC

## 2016-01-24 NOTE — Progress Notes (Signed)
Subjective:    Patient ID: Luis Yu, male    DOB: 11/08/1945, 70 y.o.   MRN: [id number]  HPI  70 year old male who presents to the office today for two week follow up regarding diabetes and a right second toe injury   During the last visit his A1c had improved from 9.7 to 7.0. His wife reported him having one low reading at 6342. I wanted him to start checking his blood sugars twice a day and bring the log with him to this visit.   I also prescribed him Doxycycline for an abrasion to his right second toe.   He has brought his log and he reports that his morning blood sugars have been ranging in the 90-200's. His evening blood sugars have been mostly in the upper 100's - 250's. He has not had any lows. He is taking his medication as directed  He also reports that the wound to his left toe has healed completely.   He is currently in the process of 8 weeks of PT. He goes three days per week. Per PT notes the patient is progressing slowly due to fatigue and SOB. He is able to tolerate all exercises.   Today in the office his wife reports that he has had one fall in the last two weeks. This did not result in any injury except for bruising to his back ( which has healed), he has not hit his head. He is not using his rolling walker at home.  His wife is also becoming increasingly concerned about patient's cognition. He feels as though his cognition is getting worse last example she can give me is that she has to continue to remind the patient to take his blood sugars and his medications, "he just can't seem to remember to take anything in his like it goes in one year and out the other."  He is also becoming increasingly short of breath. Per patient he reports that when he is walking around at home he become short of breath with minimal exertion. He is able to recover rather quickly. In the office today while walking down the hallway he became short of breath, oxygen saturations were normal and  heart rate was normal. He did sound slightly wheezy in the upper lobes bilaterally  Review of Systems  Constitutional: Negative.   HENT: Negative.   Eyes: Negative.   Respiratory: Positive for shortness of breath. Negative for apnea, cough, choking, chest tightness, wheezing and stridor.   Cardiovascular: Negative.   Gastrointestinal: Negative.   Endocrine: Negative.   Genitourinary: Negative.   Musculoskeletal: Negative.   Skin: Negative.   Allergic/Immunologic: Negative.   Neurological: Negative.   Hematological: Negative.   Psychiatric/Behavioral: Negative.   All other systems reviewed and are negative.  Past Medical History:  Diagnosis Date  . Adjustment disorder with mixed emotional features   . Atrial flutter (HCC)   . CAD (coronary artery disease)   . Depression   . Diabetes mellitus type II, uncontrolled (HCC)   . Diabetic peripheral neuropathy (HCC)   . Erectile dysfunction   . GERD (gastroesophageal reflux disease)   . Herpes zoster   . Hyperlipidemia   . Hypertension   . Obesity   . Osteoarthritis   . Postherpetic neuralgia   . Sleep apnea     Social History   Social History  . Marital status: Married    Spouse name: [name]  . Number of children: 1  . Years of education:  13   Occupational History  . Retired Naval architecttruck driver Retired   Social History Main Topics  . Smoking status: Never Smoker  . Smokeless tobacco: Never Used  . Alcohol use 1.2 oz/week    2 Standard drinks or equivalent per week     Comment: twice a month.  . Drug use: No  . Sexual activity: Yes    Partners: Female   Other Topics Concern  . Not on file   Social History Narrative   Married [name]([name])   Regular exercise: none   Caffeine use: cup of coffee daily    Retired Naval architecttruck driver   Caffeine- twice daily             Past Surgical History:  Procedure Laterality Date  . CARDIOVERSION N/A 11/10/2015   Procedure: CARDIOVERSION;  Surgeon: Laurey Moralealton S McLean, MD;  [facility];  Service: Cardiovascular;  Laterality: N/A;  . CORONARY ARTERY BYPASS GRAFT  1996   eith a LIMA to the LAD, saphenous vein graft to the acute marginal, saphenous vein graft to the PDA and saphenous vein graft to the circumflex.  [name]. KNEE ARTHROSCOPY  05/2004   right knee  . REPLACEMENT TOTAL KNEE BILATERAL    . SHOULDER SURGERY      Family History  Problem Relation Age of Onset  . Coronary artery disease Mother     deceased at 3575  . Coronary artery disease Father     died age 71  . Stroke      half brother  . Stroke Brother     No Known Allergies  Current Outpatient Prescriptions on File Prior to Visit  Medication Sig Dispense Refill  . apixaban (ELIQUIS) 5 MG TABS tablet Take 1 tablet (5 mg total) by mouth 2 (two) times daily. 60 tablet 11  . aspirin EC 81 MG tablet Take 1 tablet (81 mg total) by mouth daily.    [name]. atorvastatin (LIPITOR) 80 MG tablet Take 1 tablet (80 mg total) by mouth daily. 90 tablet 1  . Cyanocobalamin (B-12) 5000 MCG SUBL Place 1 tablet under the tongue once a week.    . fluticasone (FLONASE) 50 MCG/ACT nasal spray Place 2 sprays into both nostrils daily as needed for allergies.     [name]. gabapentin (NEURONTIN) 300 MG capsule Take 600 mg by mouth 2 (two) times daily.    [name]. glipiZIDE (GLUCOTROL) 10 MG tablet Take 1 tablet (10 mg total) by mouth 2 (two) times daily before a meal. 180 tablet 3  . glucose blood (ACCU-CHEK ACTIVE STRIPS) test strip Use as instructed 100 each 12  . Insulin Pen Needle (PEN NEEDLES 31GX5/16") 31G X 8 MM MISC Use to inject insulin 5 times a day. 100 each 11  . isosorbide mononitrate (IMDUR) 30 MG 24 hr tablet Take 1 tablet (30 mg total) by mouth daily. 30 tablet 12  . Lancets (ACCU-CHEK MULTICLIX) lancets Use up to 5 times a day to check blood sugar. 100 each 5  . lisinopril-hydrochlorothiazide (PRINZIDE,ZESTORETIC) 20-12.5 MG tablet TAKE 1 TABLET BY MOUTH EVERY DAY 90 tablet 1  . metFORMIN (GLUCOPHAGE) 1000 MG tablet Take 1 tablet  (1,000 mg total) by mouth 2 (two) times daily with a meal. 180 tablet 3  . Nebivolol HCl 20 MG TABS Take 1 tablet (20 mg total) by mouth daily. 90 tablet 1  . NOVOLOG FLEXPEN 100 UNIT/ML FlexPen USE 15 TO 30 UNITS 15 MINUTES BEFORE MEALS 3 TIMES DAILY 5 pen 0  . omeprazole (PRILOSEC)  20 MG capsule TAKE ONE CAPSULE TWICE A DAY BEFORE A MEAL 180 capsule 0  . TOUJEO SOLOSTAR 300 UNIT/ML SOPN INJECT 30 UNITS INTO THE SKIN AT BEDTIME. 4.5 mL 1   Current Facility-Administered Medications on File Prior to Visit  Medication Dose Route Frequency Provider Last Rate Last Dose  . pneumococcal 13-valent conjugate vaccine (PREVNAR 13) injection 0.5 mL  0.5 mL Intramuscular Tomorrow-1000 Syler Norcia, NP        BP (!) 150/80 (BP Location: Left Arm, Patient Position: Sitting, Cuff Size: Normal)   Pulse 62   Temp 97.9 F (36.6 C) (Oral)   Ht 6\' 3"  (1.905 m)   Wt 237 lb (107.5 kg)   SpO2 98%   BMI 29.62 kg/m       Objective:   Physical Exam  Constitutional: He is oriented to person, place, and time. He appears well-developed and well-nourished. No distress.  HENT:  Head: Normocephalic and atraumatic.  Right Ear: External ear normal.  Left Ear: External ear normal.  Nose: Nose normal.  Mouth/Throat: Oropharynx is clear and moist.  Neck: Normal range of motion. Neck supple. No thyromegaly present.  Cardiovascular: Normal rate, regular rhythm, normal heart sounds and intact distal pulses.  Exam reveals no gallop and no friction rub.   No murmur heard. Pulmonary/Chest: Effort normal. No respiratory distress. He has wheezes. He has no rales. He exhibits no tenderness.  Abdominal: Soft. Bowel sounds are normal. He exhibits no distension and no mass. There is no tenderness. There is no rebound and no guarding.  Lymphadenopathy:    He has no cervical adenopathy.  Neurological: He is alert and oriented to person, place, and time.  Skin: Skin is warm and dry. No rash noted. He is not diaphoretic. No  erythema. No pallor.  Wound on  right second toe is completely healed  Psychiatric: He has a normal mood and affect. His behavior is normal. Judgment and thought content normal.  Nursing note and vitals reviewed.     Assessment & Plan:  1. SOB (shortness of breath) on exertion - ipratropium-albuterol (DUONEB) 0.5-2.5 (3) MG/3ML nebulizer solution 3 mL; Take 3 mLs by nebulization every 6 (six) hours. - He felt improved after nebulizer treatment. Exam revealed that he was no longer wheezing. He was able to walk down the hallway without difficulty He has a  follow-up appointment with pulmonary next week.  2. Uncontrolled type 2 diabetes mellitus with diabetic neuropathy, with long-term current use of insulin (HCC) - Diabetes is better controlled. Last A1c was 7.0. Advised to cut back on sweets and carbs during dinnertime - Continue to monitor blood sugars at home and write him a log. Bring to next visit - Up in 2 months  3. Abnormality of gait - He was advised to continue with physical therapy but he needs to start using his walker at home at all times. He is aware of the dangers of falling while being on a blood thinner  4. Memory loss, short term - Nothing apparent from baseline. He does have a neurologist and I advised him to follow-up with them  Shirline Frees, NP

## 2016-01-25 ENCOUNTER — Ambulatory Visit: Payer: Medicare Other | Admitting: Physical Therapy

## 2016-01-25 ENCOUNTER — Encounter: Payer: Self-pay | Admitting: Physical Therapy

## 2016-01-25 VITALS — HR 99

## 2016-01-25 DIAGNOSIS — R2689 Other abnormalities of gait and mobility: Secondary | ICD-10-CM

## 2016-01-25 DIAGNOSIS — R260 Ataxic gait: Secondary | ICD-10-CM

## 2016-01-25 DIAGNOSIS — M6281 Muscle weakness (generalized): Secondary | ICD-10-CM | POA: Diagnosis not present

## 2016-01-25 NOTE — Therapy (Signed)
[name] [address] [place], [place], [address] Phone: [phone]   Fax:  [phone]  Physical Therapy Treatment  Patient Details  Name: Luis Yu MRN: [id number] Date of Birth: 24-Dec-1945 Referring Provider: Shirline Freesory Nafziger MD.  Encounter Date: 01/25/2016      PT End of Session - 01/25/16 1347    Visit Number 8   Number of Visits 24   Date for PT Re-Evaluation 03/04/16   PT Start Time 1344   PT Stop Time 1434   PT Time Calculation (min) 50 min   Activity Tolerance Patient tolerated treatment well;Patient limited by fatigue   Behavior [facility] Therapy [facility] for tasks assessed/performed      Past Medical History:  Diagnosis Date  . Adjustment disorder with mixed emotional features   . Atrial flutter (HCC)   . CAD (coronary artery disease)   . Depression   . Diabetes mellitus type II, uncontrolled (HCC)   . Diabetic peripheral neuropathy (HCC)   . Erectile dysfunction   . GERD (gastroesophageal reflux disease)   . Herpes zoster   . Hyperlipidemia   . Hypertension   . Obesity   . Osteoarthritis   . Postherpetic neuralgia   . Sleep apnea     Past Surgical History:  Procedure Laterality Date  . CARDIOVERSION N/A 11/10/2015   Procedure: CARDIOVERSION;  Surgeon: Laurey Moralealton S McLean, MD;  [facility];  Service: Cardiovascular;  Laterality: N/A;  . CORONARY ARTERY BYPASS GRAFT  1996   eith a LIMA to the LAD, saphenous vein graft to the acute marginal, saphenous vein graft to the PDA and saphenous vein graft to the circumflex.  [name]. KNEE ARTHROSCOPY  05/2004   right knee  . REPLACEMENT TOTAL KNEE BILATERAL    . SHOULDER SURGERY      Vitals:   01/25/16 1346  Pulse: 99  SpO2: 98%        Subjective Assessment - 01/25/16 1347    Subjective Patient could not remember what the MD told him at appointment yesterday.   Pertinent History Recent falls.  Peripheral neuropathy.   Limitations Walking   Patient Stated  Goals Walk better and improve balance.   Currently in Pain? Other (Comment)  No report of pain by patient            [facility] PT Assessment - 01/25/16 0001      Assessment   Medical Diagnosis Impaired functional mobility.     Precautions   Precautions Fall   Precaution Comments Patient is at high risk for falls.  Patient needs to be using a walker.  Gait belt recommended for additional safety.     Restrictions   Weight Bearing Restrictions No                     [facility] Adult PT Treatment/Exercise - 01/25/16 0001      Knee/Hip Exercises: Aerobic   Nustep L5 x12 min     Knee/Hip Exercises: Seated   Long Arc Quad Strengthening;Both;10 reps;3 sets   Con-wayLong Arc Quad Weight 4 lbs.   Clamshell with TheraBand Green  3x10 reps   Hamstring Curl Strengthening;Both;3 sets;10 reps   Hamstring Limitations Green therband   Sit to Starbucks CorporationSand 20 reps;without UE support     Knee/Hip Exercises: Supine   Bridges Limitations 2x10 reps (break due to SOB)             Balance Exercises - 01/25/16 1412      Balance Exercises:  Standing   Standing Eyes Opened Narrow base of support (BOS);Foam/compliant surface  x3 min; 2 finger support   Standing Eyes Closed Wide (BOA);Foam/compliant surface  1 UE support x3 min   Tandem Stance Eyes open;Foam/compliant surface;Upper extremity support 1  x3 min   SLS Eyes open;Upper extremity support 2;Solid surface;Other reps (comment)  x20 reps B hip abduction to promote SLS   Step Ups Forward;6 inch;UE support 2  x20 reps each   Heel Raises Limitations x20 reps on airex   Toe Raise Limitations x20 reps on airex             PT Short Term Goals - 01/23/16 1426      PT SHORT TERM GOAL #1   Title Independent with an initial HEP.   Time 4   Period Weeks   Status On-going     PT SHORT TERM GOAL #2   Title Increase Berg score to 32/56.   Time 4   Period Weeks   Status On-going           PT Long Term Goals - 01/23/16 1427       PT LONG TERM GOAL #1   Title Independent with an advanced HEP.   Time 8   Period Weeks   Status On-going     PT LONG TERM GOAL #2   Title Increase bilateral LE strength to a solid 4+/5 to increase stability for functional activites.   Time 8   Period Weeks   Status On-going     PT LONG TERM GOAL #3   Title Walk in clinic with QC 500 feet with supervision only.   Time 8   Period Weeks   Status On-going     PT LONG TERM GOAL #4   Title Perform a reciprocating stair gait with one railing.   Period Weeks   Status On-going     PT LONG TERM GOAL #5   Title Increase Berg score to 46-47/56.   Time 8   Period Weeks   Status On-going               Plan - 01/25/16 1435    Clinical Impression Statement Patient tolerated today's treatment fairly well and had intermittant periods of fatigue and SOB. Patient able to recover to continue exercises with short seated rest breaks. Patient able to complete all exercises both balance and therapeutic exercise with moderate multimodal cueing for proper exercise technique. Patient continues to use rollator for ambulation.   Rehab Potential Fair   PT Frequency 3x / week   PT Duration 8 weeks   PT Treatment/Interventions ADLs/Self Care Home Management;Functional mobility training;Stair training;Gait training;Therapeutic activities;Therapeutic exercise;Balance training;Neuromuscular re-education;Patient/family education   PT Next Visit Plan cont with POC MD. Shirline Freesory Nafziger   Consulted and Agree with Plan of Care Patient      Patient will benefit from skilled therapeutic intervention in order to improve the following deficits and impairments:  Abnormal gait, Decreased activity tolerance, Decreased balance, Decreased coordination, Decreased endurance, Decreased mobility, Decreased strength, Difficulty walking, Pain  Visit Diagnosis: Ataxic gait  Muscle weakness (generalized)  Other abnormalities of gait and mobility     Problem  List Patient Active Problem List   Diagnosis Date Noted  . Atrial fibrillation (HCC) 10/27/2015  . Uncoordinated movements 02/24/2014  . Unspecified hereditary and idiopathic peripheral neuropathy 02/24/2013  . Low back pain 02/24/2013  . Abnormality of gait 02/09/2013  . Memory loss, short term 12/05/2011  . LUMBAR STRAIN, ACUTE 05/05/2010  .  OSA (obstructive sleep apnea) 09/14/2009  . BPH (benign prostatic hyperplasia) 07/27/2009  . CARDIOVASCULAR STUDIES, ABNORMAL 07/27/2009  . CHEST PAIN 06/08/2009  . OSTEOARTHRITIS 05/30/2009  . HEEL PAIN, LEFT 05/24/2009  . Dyspnea 05/24/2009  . ADJUSTMENT DISORDER WITH MIXED FEATURES 08/25/2008  . Diabetes type 2, uncontrolled (HCC) 02/24/2008  . ERECTILE DYSFUNCTION 02/10/2007  . Hyperlipidemia 10/09/2006  . OBESITY 10/09/2006  . Essential hypertension 10/09/2006  . Coronary atherosclerosis 10/09/2006  . GERD 10/09/2006  . DM (diabetes mellitus) type II uncontrolled with retinopathy 06/10/2006    Evelene Croon, PTA 01/25/2016, 2:41 PM  [name] [address] [place], [place], [address] Phone: [phone]   Fax:  [phone]  Name: Luis Yu MRN: [id number] Date of Birth: 03-01-1946

## 2016-01-26 ENCOUNTER — Ambulatory Visit: Payer: Medicare Other | Admitting: *Deleted

## 2016-01-26 DIAGNOSIS — M6281 Muscle weakness (generalized): Secondary | ICD-10-CM | POA: Diagnosis not present

## 2016-01-26 DIAGNOSIS — R2689 Other abnormalities of gait and mobility: Secondary | ICD-10-CM | POA: Diagnosis not present

## 2016-01-26 DIAGNOSIS — R260 Ataxic gait: Secondary | ICD-10-CM

## 2016-01-26 NOTE — Therapy (Signed)
[name] [address] [place], [place], [address] Phone: [phone]   Fax:  [phone]  Physical Therapy Treatment  Patient Details  Name: Luis Yu MRN: [id number] Date of Birth: Oct 30, 1945 Referring Provider: Shirline Freesory Nafziger MD.  Encounter Date: 01/26/2016      PT End of Session - 01/26/16 1433    Visit Number 9   Number of Visits 24   Date for PT Re-Evaluation 03/04/16   PT Start Time 1345   PT Stop Time 1436   PT Time Calculation (min) 51 min      Past Medical History:  Diagnosis Date  . Adjustment disorder with mixed emotional features   . Atrial flutter (HCC)   . CAD (coronary artery disease)   . Depression   . Diabetes mellitus type II, uncontrolled (HCC)   . Diabetic peripheral neuropathy (HCC)   . Erectile dysfunction   . GERD (gastroesophageal reflux disease)   . Herpes zoster   . Hyperlipidemia   . Hypertension   . Obesity   . Osteoarthritis   . Postherpetic neuralgia   . Sleep apnea     Past Surgical History:  Procedure Laterality Date  . CARDIOVERSION N/A 11/10/2015   Procedure: CARDIOVERSION;  Surgeon: Laurey Moralealton S McLean, MD;  [facility];  Service: Cardiovascular;  Laterality: N/A;  . CORONARY ARTERY BYPASS GRAFT  1996   eith a LIMA to the LAD, saphenous vein graft to the acute marginal, saphenous vein graft to the PDA and saphenous vein graft to the circumflex.  [name]. KNEE ARTHROSCOPY  05/2004   right knee  . REPLACEMENT TOTAL KNEE BILATERAL    . SHOULDER SURGERY      There were no vitals filed for this visit.      Subjective Assessment - 01/25/16 1347    Subjective Patient could not remember what the MD told him at appointment yesterday.   Pertinent History Recent falls.  Peripheral neuropathy.   Limitations Walking   Patient Stated Goals Walk better and improve balance.   Currently in Pain? Other (Comment)  No report of pain by patient                          [facility] Treatment/Exercise - 01/26/16 0001      Knee/Hip Exercises: Aerobic   Nustep 10min L4 UE/LE monitored for progression     Knee/Hip Exercises: Standing   Heel Raises Both;10 reps;3 sets     Knee/Hip Exercises: Seated   Long Arc Quad Strengthening;Both;10 reps;3 sets   Long Arc Quad Weight 4 lbs.   Clamshell with TheraBand Green  3x10 reps   Hamstring Curl Strengthening;Both;3 sets;10 reps   Hamstring Limitations Green therband   Sit to Starbucks CorporationSand 20 reps;without UE support  HOLD BALANCE AT TOP FOR 3-5 SECS             Balance Exercises - 01/26/16 1358      Balance Exercises: Standing   Tandem Stance Eyes open;Foam/compliant surface;Upper extremity support 1  x3 min   SLS Eyes open;Upper extremity support 2;Solid surface;Other reps (comment)  x20 reps B hip abduction to promote SLS   Step Ups Forward;6 inch;Intermittent UE support  3x20 reps each toe taps   CGA  Pt got SOB  O2 96%   Heel Raises Limitations x20 reps on airex   Toe Raise Limitations x20 reps on airex             PT  Short Term Goals - 01/23/16 1426      PT SHORT TERM GOAL #1   Title Independent with an initial HEP.   Time 4   Period Weeks   Status On-going     PT SHORT TERM GOAL #2   Title Increase Berg score to 32/56.   Time 4   Period Weeks   Status On-going           PT Long Term Goals - 01/23/16 1427      PT LONG TERM GOAL #1   Title Independent with an advanced HEP.   Time 8   Period Weeks   Status On-going     PT LONG TERM GOAL #2   Title Increase bilateral LE strength to a solid 4+/5 to increase stability for functional activites.   Time 8   Period Weeks   Status On-going     PT LONG TERM GOAL #3   Title Walk in clinic with QC 500 feet with supervision only.   Time 8   Period Weeks   Status On-going     PT LONG TERM GOAL #4   Title Perform a reciprocating stair gait with one railing.   Period Weeks   Status On-going     PT LONG TERM GOAL #5   Title  Increase Berg score to 46-47/56.   Time 8   Period Weeks   Status On-going               Plan - 01/26/16 1809    Clinical Impression Statement Pt did fairly well again with Rx today, but continues to have periods of SOB during standing ex.'s and Act.'s. His O2 was good at 95%. He arrived at clinic using a 4WW to ambulate. He still needs V/C's during RX for technique   Rehab Potential Fair   PT Frequency 3x / week   PT Duration 8 weeks   PT Treatment/Interventions ADLs/Self Care Home Management;Functional mobility training;Stair training;Gait training;Therapeutic activities;Therapeutic exercise;Balance training;Neuromuscular re-education;Patient/family education   PT Next Visit Plan cont with POC MD. Shirline Freesory Nafziger   PT Home Exercise Plan issue HEP   Consulted and Agree with Plan of Care Patient      Patient will benefit from skilled therapeutic intervention in order to improve the following deficits and impairments:  Abnormal gait, Decreased activity tolerance, Decreased balance, Decreased coordination, Decreased endurance, Decreased mobility, Decreased strength, Difficulty walking, Pain  Visit Diagnosis: Ataxic gait  Muscle weakness (generalized)  Other abnormalities of gait and mobility     Problem List Patient Active Problem List   Diagnosis Date Noted  . Atrial fibrillation (HCC) 10/27/2015  . Uncoordinated movements 02/24/2014  . Unspecified hereditary and idiopathic peripheral neuropathy 02/24/2013  . Low back pain 02/24/2013  . Abnormality of gait 02/09/2013  . Memory loss, short term 12/05/2011  . LUMBAR STRAIN, ACUTE 05/05/2010  . OSA (obstructive sleep apnea) 09/14/2009  . BPH (benign prostatic hyperplasia) 07/27/2009  . CARDIOVASCULAR STUDIES, ABNORMAL 07/27/2009  . CHEST PAIN 06/08/2009  . OSTEOARTHRITIS 05/30/2009  . HEEL PAIN, LEFT 05/24/2009  . Dyspnea 05/24/2009  . ADJUSTMENT DISORDER WITH MIXED FEATURES 08/25/2008  . Diabetes type 2,  uncontrolled (HCC) 02/24/2008  . ERECTILE DYSFUNCTION 02/10/2007  . Hyperlipidemia 10/09/2006  . OBESITY 10/09/2006  . Essential hypertension 10/09/2006  . Coronary atherosclerosis 10/09/2006  . GERD 10/09/2006  . DM (diabetes mellitus) type II uncontrolled with retinopathy 06/10/2006    RAMSEUR,CHRIS, PTA 01/26/2016, 6:16 PM  [name] [address] [place], [place], [address] Phone: [phone]   Fax:  [phone]  Name: Luis Yu MRN: [id number] Date of Birth: 23-Sep-1945

## 2016-01-28 ENCOUNTER — Other Ambulatory Visit: Payer: Self-pay | Admitting: Family Medicine

## 2016-01-30 ENCOUNTER — Encounter: Payer: Self-pay | Admitting: Pulmonary Disease

## 2016-01-30 ENCOUNTER — Encounter: Payer: Self-pay | Admitting: Physical Therapy

## 2016-01-30 ENCOUNTER — Other Ambulatory Visit: Payer: Self-pay | Admitting: Adult Health

## 2016-01-30 ENCOUNTER — Ambulatory Visit: Payer: Medicare Other | Admitting: Physical Therapy

## 2016-01-30 VITALS — HR 81

## 2016-01-30 DIAGNOSIS — R2689 Other abnormalities of gait and mobility: Secondary | ICD-10-CM

## 2016-01-30 DIAGNOSIS — R260 Ataxic gait: Secondary | ICD-10-CM

## 2016-01-30 DIAGNOSIS — M6281 Muscle weakness (generalized): Secondary | ICD-10-CM

## 2016-01-30 NOTE — Telephone Encounter (Signed)
Ok to refill for one year  

## 2016-01-30 NOTE — Telephone Encounter (Signed)
Ok to refill 

## 2016-01-30 NOTE — Therapy (Signed)
[name] [address] [place], [place], [address] Phone: [phone]   Fax:  [phone]  Physical Therapy Treatment  Patient Details  Name: Luis ImusDonald W Seipp MRN: [id number] Date of Birth: Apr 18, 1945 Referring Provider: Shirline Freesory Nafziger MD.  Encounter Date: 01/30/2016      PT End of Session - 01/30/16 1353    Visit Number 10   Number of Visits 24   Date for PT Re-Evaluation 03/04/16   PT Start Time 1348   PT Stop Time 1430   PT Time Calculation (min) 42 min   Activity Tolerance Patient tolerated treatment well;Patient limited by fatigue   [facility] tasks assessed/performed      Past Medical History:  Diagnosis Date  . Adjustment disorder with mixed emotional features   . Atrial flutter (HCC)   . CAD (coronary artery disease)   . Depression   . Diabetes mellitus type II, uncontrolled (HCC)   . Diabetic peripheral neuropathy (HCC)   . Erectile dysfunction   . GERD (gastroesophageal reflux disease)   . Herpes zoster   . Hyperlipidemia   . Hypertension   . Obesity   . Osteoarthritis   . Postherpetic neuralgia   . Sleep apnea     Past Surgical History:  Procedure Laterality Date  . CARDIOVERSION N/A 11/10/2015   Procedure: CARDIOVERSION;  Surgeon: Laurey Moralealton S McLean, MD;  [facility];  Service: Cardiovascular;  Laterality: N/A;  . CORONARY ARTERY BYPASS GRAFT  1996   eith a LIMA to the LAD, saphenous vein graft to the acute marginal, saphenous vein graft to the PDA and saphenous vein graft to the circumflex.  [name]. KNEE ARTHROSCOPY  05/2004   right knee  . REPLACEMENT TOTAL KNEE BILATERAL    . SHOULDER SURGERY      Vitals:   01/30/16 1355  Pulse: 81  SpO2: 94%        Subjective Assessment - 01/30/16 1348    Subjective Reports that he is better than what he was last week.   Pertinent History Recent falls.  Peripheral neuropathy.   Limitations Walking   Patient Stated Goals Walk better and  improve balance.   Currently in Pain? Other (Comment)  Did not report any pain today only abdominal discomfort with bending over            [facility] - 01/30/16 0001      Assessment   Medical Diagnosis Impaired functional mobility.     Precautions   Precautions Fall   Precaution Comments Patient is at high risk for falls.  Patient needs to be using a walker.  Gait belt recommended for additional safety.     Restrictions   Weight Bearing Restrictions No                     [facility] Adult PT Treatment/Exercise - 01/30/16 0001      Standardized Balance Assessment   Standardized Balance Assessment Berg Balance Test     Berg Balance Test   Sit to Stand Able to stand without using hands and stabilize independently   Standing Unsupported Able to stand safely 2 minutes   Sitting with Back Unsupported but Feet Supported on Floor or Stool Able to sit safely and securely 2 minutes   Stand to Sit Sits safely with minimal use of hands   Transfers Able to transfer safely, minor use of hands   Standing Unsupported with Eyes Closed Able to stand 10 seconds safely  Standing Ubsupported with Feet Together Able to place feet together independently and stand 1 minute safely   From Standing, Reach Forward with Outstretched Arm Can reach forward >12 cm safely (5")   From Standing Position, Pick up Object from Floor Able to pick up shoe safely and easily  Reports abdomen pain with bending over and SOB   From Standing Position, Turn to Look Behind Over each Shoulder Looks behind one side only/other side shows less weight shift   Turn 360 Degrees Able to turn 360 degrees safely one side only in 4 seconds or less   Standing Unsupported, Alternately Place Feet on Step/Stool Able to stand independently and safely and complete 8 steps in 20 seconds   Standing Unsupported, One Foot in Front Able to plae foot ahead of the other independently and hold 30 seconds   Standing on One Leg  Able to lift leg independently and hold equal to or more than 3 seconds   Total Score 50     Knee/Hip Exercises: Aerobic   Nustep 10min L4 UE/LE monitored for progression     Knee/Hip Exercises: Seated   Long Arc Quad Strengthening;Both;10 reps;3 sets   Con-wayLong Arc Quad Weight 4 lbs.   Clamshell with TheraBand Green  3x10 reps   Hamstring Curl Strengthening;Both;3 sets;10 reps   Hamstring Limitations [place] SiGreen therband                  PT Short Term Goals - 01/23/16 1426      PT SHORT TERM GOAL #1   Title Independent with an initial HEP.   Time 4   Period Weeks   Status On-going     PT SHORT TERM GOAL #2   Title Increase Berg score to 32/56.   Time 4   Period Weeks   Status On-going           PT Long Term Goals - 01/23/16 1427      PT LONG TERM GOAL #1   Title Independent with an advanced HEP.   Time 8   Period Weeks   Status On-going     PT LONG TERM GOAL #2   Title Increase bilateral LE strength to a solid 4+/5 to increase stability for functional activites.   Time 8   Period Weeks   Status On-going     PT LONG TERM GOAL #3   Title Walk in clinic with QC 500 feet with supervision only.   Time 8   Period Weeks   Status On-going     PT LONG TERM GOAL #4   Title Perform a reciprocating stair gait with one railing.   Period Weeks   Status On-going     PT LONG TERM GOAL #5   Title Increase Berg score to 46-47/56.   Time 8   Period Weeks   Status On-going               Plan - 01/30/16 1435    Clinical Impression Statement Patient tolerated today's treatment fairly well although he continues to require seated rest breaks or was observed with SOB. Patient able to complete 15 minutes on Nustep without complaint and only minimal SOB. Patient able to improve BERG score to 50/54 with short seated rest breaks secondary to SOB. Greatest difficulty being with tandem stance as well as SLS. Patient's vitals monitored following end of treatment wiht 93%  O2 sat, 88bpm. Patient educated regarding pursed lip breathing with patient demonstrating proper technique.    Rehab  Potential Fair   PT Frequency 3x / week   PT Duration 8 weeks   PT Treatment/Interventions ADLs/Self Care Home Management;Functional mobility training;Stair training;Gait training;Therapeutic activities;Therapeutic exercise;Balance training;Neuromuscular re-education;Patient/family education   PT Next Visit Plan cont with POC MD. Shirline Frees   Consulted and Agree with Plan of Care Patient      Patient will benefit from skilled therapeutic intervention in order to improve the following deficits and impairments:  Abnormal gait, Decreased activity tolerance, Decreased balance, Decreased coordination, Decreased endurance, Decreased mobility, Decreased strength, Difficulty walking, Pain  Visit Diagnosis: Ataxic gait  Muscle weakness (generalized)  Other abnormalities of gait and mobility     Problem List Patient Active Problem List   Diagnosis Date Noted  . Atrial fibrillation (HCC) 10/27/2015  . Uncoordinated movements 02/24/2014  . Unspecified hereditary and idiopathic peripheral neuropathy 02/24/2013  . Low back pain 02/24/2013  . Abnormality of gait 02/09/2013  . Memory loss, short term 12/05/2011  . LUMBAR STRAIN, ACUTE 05/05/2010  . OSA (obstructive sleep apnea) 09/14/2009  . BPH (benign prostatic hyperplasia) 07/27/2009  . CARDIOVASCULAR STUDIES, ABNORMAL 07/27/2009  . CHEST PAIN 06/08/2009  . OSTEOARTHRITIS 05/30/2009  . HEEL PAIN, LEFT 05/24/2009  . Dyspnea 05/24/2009  . ADJUSTMENT DISORDER WITH MIXED FEATURES 08/25/2008  . Diabetes type 2, uncontrolled (HCC) 02/24/2008  . ERECTILE DYSFUNCTION 02/10/2007  . Hyperlipidemia 10/09/2006  . OBESITY 10/09/2006  . Essential hypertension 10/09/2006  . Coronary atherosclerosis 10/09/2006  . GERD 10/09/2006  . DM (diabetes mellitus) type II uncontrolled with retinopathy 06/10/2006    Florence Canner,  PTA 01/30/16 2:43 PM  [name] [address] [place], [place], [address] Phone: [phone]   Fax:  [phone]  Name: DEAN GOLDNER MRN: [id number] Date of Birth: 07/28/1945

## 2016-02-01 ENCOUNTER — Ambulatory Visit: Payer: Medicare Other | Admitting: Physical Therapy

## 2016-02-01 DIAGNOSIS — M6281 Muscle weakness (generalized): Secondary | ICD-10-CM

## 2016-02-01 DIAGNOSIS — R260 Ataxic gait: Secondary | ICD-10-CM

## 2016-02-01 DIAGNOSIS — R2689 Other abnormalities of gait and mobility: Secondary | ICD-10-CM

## 2016-02-01 NOTE — Therapy (Signed)
[name] [place], [name], [address] Phone: [phone]   Fax:  [phone]  Physical Therapy Treatment  Patient Details  Name: Luis Yu MRN: [id number] Date of Birth: April 09, 1945 Referring Provider: Dorothyann Peng MD.  Encounter Date: 02/01/2016      PT End of Session - 02/01/16 1416    Visit Number 11   Number of Visits 24   Date for PT Re-Evaluation 03/04/16   PT Start Time 1400   PT Stop Time 1443   PT Time Calculation (min) 43 min   Activity Tolerance Patient tolerated treatment well   Behavior During Therapy [facility] for tasks assessed/performed      Past Medical History:  Diagnosis Date  . Adjustment disorder with mixed emotional features   . Atrial flutter ([place])   . CAD (coronary artery disease)   . Depression   . Diabetes mellitus type II, uncontrolled ([place])   . Diabetic peripheral neuropathy ([place])   . Erectile dysfunction   . GERD (gastroesophageal reflux disease)   . Herpes zoster   . Hyperlipidemia   . Hypertension   . Obesity   . Osteoarthritis   . Postherpetic neuralgia   . Sleep apnea     Past Surgical History:  Procedure Laterality Date  . CARDIOVERSION N/A 11/10/2015   Procedure: CARDIOVERSION;  Surgeon: Larey Dresser, MD;  [facility] ENDOSCOPY;  Service: Cardiovascular;  Laterality: N/A;  . CORONARY ARTERY BYPASS GRAFT  1996   eith a LIMA to the LAD, saphenous vein graft to the acute marginal, saphenous vein graft to the PDA and saphenous vein graft to the circumflex.  [name] KNEE ARTHROSCOPY  05/2004   right knee  . REPLACEMENT TOTAL KNEE BILATERAL    . SHOULDER SURGERY      There were no vitals filed for this visit.      Subjective Assessment - 02/01/16 1404    Subjective Patient arrived feeling better overall and no falls reported   Pertinent History Recent falls.  Peripheral neuropathy.   Limitations Walking   Patient Stated Goals Walk better and improve balance.   Currently  in Pain? No/denies                         [facility] Adult PT Treatment/Exercise - 02/01/16 0001      Berg Balance Test   Sit to Stand Able to stand without using hands and stabilize independently   Standing Unsupported Able to stand safely 2 minutes   Sitting with Back Unsupported but Feet Supported on Floor or Stool Able to sit safely and securely 2 minutes   Stand to Sit Sits safely with minimal use of hands   Transfers Able to transfer safely, minor use of hands   Standing Unsupported with Eyes Closed Able to stand 10 seconds safely   Standing Ubsupported with Feet Together Able to place feet together independently and stand 1 minute safely   From Standing, Reach Forward with Outstretched Arm Can reach forward >12 cm safely (5")   From Standing Position, Pick up Object from Floor Able to pick up shoe safely and easily   From Standing Position, Turn to Look Behind Over each Shoulder Looks behind one side only/other side shows less weight shift   Turn 360 Degrees Able to turn 360 degrees safely one side only in 4 seconds or less   Standing Unsupported, Alternately Place Feet on Step/Stool Able to stand independently and safely and complete 8  steps in 20 seconds   Standing Unsupported, One Foot in [place] to plae foot ahead of the other independently and hold 30 seconds   Standing on One Leg Able to lift leg independently and hold equal to or more than 3 seconds   Total Score 50     Knee/Hip Exercises: Aerobic   Nustep 26mn L4 UE/LE monitored for progression     Knee/Hip Exercises: Standing   Knee Flexion Strengthening;Both;2 sets;10 reps   Knee Flexion Limitations 4#   Hip Flexion Stengthening;Both;2 sets;10 reps   Hip Flexion Limitations 4#   Hip Abduction Stengthening;Both;2 sets;10 reps   Abduction Limitations 4#   Forward Step Up Both;2 sets;10 reps;Hand Hold: 1;Step Height: 6"     Knee/Hip Exercises: Seated   Long Arc Quad Strengthening;Both;10 reps;3 sets    Long Arc Quad Weight 4 lbs.   Clamshell with TMarga Hoots x30                  PT Short Term Goals - 02/01/16 1418      PT SHORT TERM GOAL #1   Title Independent with an initial HEP.   Time 4   Period Weeks   Status On-going     PT SHORT TERM GOAL #2   Title Increase Berg score to 32/56.   Time 4   Period Weeks   Status Achieved  50/56 02/01/16           PT Long Term Goals - 02/01/16 1419      PT LONG TERM GOAL #1   Title Independent with an advanced HEP.   Time 8   Period Weeks   Status On-going     PT LONG TERM GOAL #2   Title Increase bilateral LE strength to a solid 4+/5 to increase stability for functional activites.   Time 8   Period Weeks   Status On-going     PT LONG TERM GOAL #3   Title Walk in clinic with QC 500 feet with supervision only.   Time 8   Period Weeks   Status On-going     PT LONG TERM GOAL #4   Title Perform a reciprocating stair gait with one railing.   Time 8   Period Weeks   Status On-going     PT LONG TERM GOAL #5   Title Increase Berg score to 46-47/56.   Time 8   Period Weeks   Status Achieved  50/56 02/01/16               Plan - 02/01/16 1421    Clinical Impression Statement Patient progressing with all activities today. Patient has reported no LOB or falls. Patient has improved BERG balance to 50/56 today. Patient able to perform exercises with minimal cues. Patient met STG #2 and LTG #5 others ongoing due to strength deficits.    Rehab Potential Fair   PT Frequency 3x / week   PT Duration 8 weeks   PT Treatment/Interventions ADLs/Self Care Home Management;Functional mobility training;Stair training;Gait training;Therapeutic activities;Therapeutic exercise;Balance training;Neuromuscular re-education;Patient/family education   PT Next Visit Plan cont with POC per MPT   Consulted and Agree with Plan of Care Patient      Patient will benefit from skilled therapeutic intervention in order to  improve the following deficits and impairments:  Abnormal gait, Decreased activity tolerance, Decreased balance, Decreased coordination, Decreased endurance, Decreased mobility, Decreased strength, Difficulty walking, Pain  Visit Diagnosis: Ataxic gait  Muscle weakness (generalized)  Other abnormalities  of gait and mobility     Problem List Patient Active Problem List   Diagnosis Date Noted  . Atrial fibrillation ([place]) 10/27/2015  . Uncoordinated movements 02/24/2014  . Unspecified hereditary and idiopathic peripheral neuropathy 02/24/2013  . Low back pain 02/24/2013  . Abnormality of gait 02/09/2013  . Memory loss, short term 12/05/2011  . LUMBAR STRAIN, ACUTE 05/05/2010  . OSA (obstructive sleep apnea) 09/14/2009  . BPH (benign prostatic hyperplasia) 07/27/2009  . CARDIOVASCULAR STUDIES, ABNORMAL 07/27/2009  . CHEST PAIN 06/08/2009  . OSTEOARTHRITIS 05/30/2009  . HEEL PAIN, LEFT 05/24/2009  . Dyspnea 05/24/2009  . ADJUSTMENT DISORDER WITH MIXED FEATURES 08/25/2008  . Diabetes type 2, uncontrolled ([place]) 02/24/2008  . ERECTILE DYSFUNCTION 02/10/2007  . Hyperlipidemia 10/09/2006  . OBESITY 10/09/2006  . Essential hypertension 10/09/2006  . Coronary atherosclerosis 10/09/2006  . GERD 10/09/2006  . DM (diabetes mellitus) type II uncontrolled with retinopathy 06/10/2006    Ahmani Prehn P, PTA 02/01/2016, 2:44 PM  [facility] [place], [name], [address] Phone: [phone]   Fax:  [phone]  Name: Luis Yu MRN: [id number] Date of Birth: 29-Sep-1945

## 2016-02-02 ENCOUNTER — Ambulatory Visit (INDEPENDENT_AMBULATORY_CARE_PROVIDER_SITE_OTHER): Payer: Medicare Other | Admitting: Pulmonary Disease

## 2016-02-02 ENCOUNTER — Encounter: Payer: Self-pay | Admitting: Pulmonary Disease

## 2016-02-02 VITALS — BP 136/66 | HR 61 | Ht 75.0 in | Wt 237.0 lb

## 2016-02-02 DIAGNOSIS — F5101 Primary insomnia: Secondary | ICD-10-CM

## 2016-02-02 DIAGNOSIS — I25810 Atherosclerosis of coronary artery bypass graft(s) without angina pectoris: Secondary | ICD-10-CM

## 2016-02-02 DIAGNOSIS — G4733 Obstructive sleep apnea (adult) (pediatric): Secondary | ICD-10-CM | POA: Diagnosis not present

## 2016-02-02 DIAGNOSIS — G4700 Insomnia, unspecified: Secondary | ICD-10-CM | POA: Insufficient documentation

## 2016-02-02 NOTE — Progress Notes (Signed)
   Subjective:    Patient ID: Luis Yu, male    DOB: 1946/03/11, 70 y.o.   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 Follow-up    pt having difficulties withcpap.     He had uneventful year. 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 He was given a Industrial/product designerloaner machine with increased auto CPAP settings Download shows AHI of 31/hour with average pressure of 17 cm on auto 10-20 cm with increased usage 5.5 hours and large leak that is persistent  Significant tests/ events   PSG 6/27 /11 - wt 246 lbs -showed severe obstructive sleep apnea with AHI 50/h & desaturation to 87%, corrected by CPAP 10 cm - he did not tolerate a full face mask  download 11/2009 >> good compliance, avg pr 10 cm - residual AHI 15/h, central 4/h, leak ++ download 01/2010 >> excellent compliance ,residual events (AHI 21/h, centrals 5/h ) on 10 cm Jan 2012  download on 12 cm shows leak ++, residual AHI 21/h,centrals 5/h, good usage Download on 12 cm 3/6-06/20/10 shows increased centrals 11/h, with AHI 28/h, hypopneas 11/h       Review of Systems neg for any significant sore throat, dysphagia, itching, sneezing, nasal congestion or excess/ purulent secretions, fever, chills, sweats, unintended wt loss, pleuritic or exertional cp, hempoptysis, orthopnea pnd or change in chronic leg swelling. Also denies presyncope, palpitations, heartburn, abdominal pain, nausea,  vomiting, diarrhea or change in bowel or urinary habits, dysuria,hematuria, Yu, arthralgias, visual complaints, headache, numbness weakness or ataxia.     Objective:   Physical Exam  Gen. Pleasant, well-nourished, in no distress ENT - no lesions, no post nasal drip Neck: No JVD, no thyromegaly, no carotid bruits Lungs: no use of accessory muscles, no dullness to percussion, clear without rales or rhonchi  Cardiovascular: Rhythm regular, heart sounds  normal, no murmurs or gallops, no peripheral edema Musculoskeletal: No deformities, no cyanosis or clubbing        Assessment & Plan:

## 2016-02-02 NOTE — Assessment & Plan Note (Signed)
He has significant residual AHI on his download reports in spite of changing to auto CPAP settings with average pressure of 17 cm. He also has significant central apneas about 7/hour Not sure what is causing this-he will need a repeat CPAP titration, he may need bilevel  He will continue using his CPAP until then  Weight loss encouraged, compliance with goal of at least 4-6 hrs every night is the expectation. Advised against medications with sedative side effects Cautioned against driving when sleepy - understanding that sleepiness will vary on a day to day basis

## 2016-02-02 NOTE — Assessment & Plan Note (Signed)
Use melatonin 5 mg at around 8 PM Light exposure around 8 AM

## 2016-02-02 NOTE — Patient Instructions (Addendum)
Schedule CPAP titration study You have residue events on CPAP-continue using CPAP at current settings for now  Use melatonin 5 mg at around 8 PM Light exposure around 8 AM

## 2016-02-03 ENCOUNTER — Ambulatory Visit: Payer: Medicare Other | Admitting: Physical Therapy

## 2016-02-03 DIAGNOSIS — R260 Ataxic gait: Secondary | ICD-10-CM

## 2016-02-03 DIAGNOSIS — M6281 Muscle weakness (generalized): Secondary | ICD-10-CM

## 2016-02-03 DIAGNOSIS — R2689 Other abnormalities of gait and mobility: Secondary | ICD-10-CM

## 2016-02-03 NOTE — Therapy (Signed)
[name] [address] [place], [place], [address] Phone: [phone]   Fax:  [phone]  Physical Therapy Treatment  Patient Details  Name: Luis Yu MRN: [id number] Date of Birth: [birth year] Referring Provider: Shirline Frees MD.  Encounter Date: 02/03/2016      PT End of Session - 02/03/16 1118    Visit Number 12   Number of Visits 24   Date for PT Re-Evaluation 03/04/16   PT Start Time 1115   PT Stop Time 1200   PT Time Calculation (min) 45 min   Activity Tolerance Patient tolerated treatment well   Behavior During Therapy [facility] for tasks assessed/performed      Past Medical History:  Diagnosis Date  . Adjustment disorder with mixed emotional features   . Atrial flutter (HCC)   . CAD (coronary artery disease)   . Depression   . Diabetes mellitus type II, uncontrolled (HCC)   . Diabetic peripheral neuropathy (HCC)   . Erectile dysfunction   . GERD (gastroesophageal reflux disease)   . Herpes zoster   . Hyperlipidemia   . Hypertension   . Obesity   . Osteoarthritis   . Postherpetic neuralgia   . Sleep apnea     Past Surgical History:  Procedure Laterality Date  . CARDIOVERSION N/A 11/10/2015   Procedure: CARDIOVERSION;  Surgeon: Laurey Morale, MD;  Location: [facility] ENDOSCOPY;  Service: Cardiovascular;  Laterality: N/A;  . CORONARY ARTERY BYPASS GRAFT  1996   eith a LIMA to the LAD, saphenous vein graft to the acute marginal, saphenous vein graft to the PDA and saphenous vein graft to the circumflex.  [name] KNEE ARTHROSCOPY  05/2004   right knee  . REPLACEMENT TOTAL KNEE BILATERAL    . SHOULDER SURGERY      There were no vitals filed for this visit.      Subjective Assessment - 02/03/16 1125    Subjective Reports no recent falls. Reports that he is fatigued today.   Pertinent History Recent falls.  Peripheral neuropathy.   Limitations Walking   Patient Stated Goals Walk better and improve balance.   Currently  in Pain? No/denies            [facility] PT Assessment - 02/03/16 0001      Assessment   Medical Diagnosis Impaired functional mobility.     Precautions   Precautions Fall   Precaution Comments Patient is at high risk for falls.  Patient needs to be using a walker.  Gait belt recommended for additional safety.     Restrictions   Weight Bearing Restrictions No                     [facility] Adult PT Treatment/Exercise - 02/03/16 0001      Knee/Hip Exercises: Aerobic   Nustep L5 UE/LE monitored for progression     Knee/Hip Exercises: Standing   Hip Flexion Stengthening;Both;3 sets;10 reps;Knee bent   Hip Flexion Limitations 4#   Hip Abduction Stengthening;Both;2 sets;10 reps;Knee straight   Abduction Limitations 4#   Forward Step Up Both;2 sets;10 reps;Hand Hold: 1;Step Height: 6"     Knee/Hip Exercises: Seated   Long Arc Quad Strengthening;Both;10 reps;3 sets   [employer] Weight 4 lbs.   Clamshell with TheraBand Green  3x10 reps   Hamstring Curl Strengthening;Both;3 sets;10 reps   Hamstring Limitations Green therband   Sit to [employer] 20 reps;without UE support  Balance Exercises - 02/03/16 1154      Balance Exercises: Standing   Standing Eyes Closed Narrow base of support (BOS);Foam/compliant surface  2 finger support x3 min   Tandem Stance Eyes open;Foam/compliant surface;Upper extremity support 1  x3 min    Sidestepping Foam/compliant support  x3 min   Heel Raises Limitations x20 reps on floor   Toe Raise Limitations x20 reps on floor             PT Short Term Goals - 02/01/16 1418      PT SHORT TERM GOAL #1   Title Independent with an initial HEP.   Time 4   Period Weeks   Status On-going     PT SHORT TERM GOAL #2   Title Increase Berg score to 32/56.   Time 4   Period Weeks   Status Achieved  50/56 02/01/16           PT Long Term Goals - 02/01/16 1419      PT LONG TERM GOAL #1   Title Independent with an  advanced HEP.   Time 8   Period Weeks   Status On-going     PT LONG TERM GOAL #2   Title Increase bilateral LE strength to a solid 4+/5 to increase stability for functional activites.   Time 8   Period Weeks   Status On-going     PT LONG TERM GOAL #3   Title Walk in clinic with QC 500 feet with supervision only.   Time 8   Period Weeks   Status On-going     PT LONG TERM GOAL #4   Title Perform a reciprocating stair gait with one railing.   Time 8   Period Weeks   Status On-going     PT LONG TERM GOAL #5   Title Increase Berg score to 46-47/56.   Time 8   Period Weeks   Status Achieved  50/56 02/01/16               Plan - 02/03/16 1201    Clinical Impression Statement Patient tolerated today's treatment well although he required short seated rest breaks secondary to fatigue. Patient did not demonstrate SOB as he has in previous treatments. Patient able to complete therapeutic exercise and balance exercises as directed with moderate multimodal cueing for proper technique. Patient continues to ambulate with greater stability using rollator. Instability noted with uneven surface and eyes open with narrow BOS such as eyes closed exercise and tandem static stance on airex as completed today.   Rehab Potential Fair   PT Frequency 3x / week   PT Duration 8 weeks   PT Treatment/Interventions ADLs/Self Care Home Management;Functional mobility training;Stair training;Gait training;Therapeutic activities;Therapeutic exercise;Balance training;Neuromuscular re-education;Patient/family education   PT Next Visit Plan cont with POC per MPT   Consulted and Agree with Plan of Care Patient      Patient will benefit from skilled therapeutic intervention in order to improve the following deficits and impairments:  Abnormal gait, Decreased activity tolerance, Decreased balance, Decreased coordination, Decreased endurance, Decreased mobility, Decreased strength, Difficulty walking,  Pain  Visit Diagnosis: Ataxic gait  Muscle weakness (generalized)  Other abnormalities of gait and mobility     Problem List Patient Active Problem List   Diagnosis Date Noted  . Insomnia 02/02/2016  . Atrial fibrillation (HCC) 10/27/2015  . Uncoordinated movements 02/24/2014  . Unspecified hereditary and idiopathic peripheral neuropathy 02/24/2013  . Low back pain 02/24/2013  . Abnormality of gait  02/09/2013  . Memory loss, short term 12/05/2011  . LUMBAR STRAIN, ACUTE 05/05/2010  . OSA (obstructive sleep apnea) 09/14/2009  . BPH (benign prostatic hyperplasia) 07/27/2009  . CARDIOVASCULAR STUDIES, ABNORMAL 07/27/2009  . CHEST PAIN 06/08/2009  . OSTEOARTHRITIS 05/30/2009  . HEEL PAIN, LEFT 05/24/2009  . Dyspnea 05/24/2009  . ADJUSTMENT DISORDER WITH MIXED FEATURES 08/25/2008  . Diabetes type 2, uncontrolled (HCC) 02/24/2008  . ERECTILE DYSFUNCTION 02/10/2007  . Hyperlipidemia 10/09/2006  . OBESITY 10/09/2006  . Essential hypertension 10/09/2006  . Coronary atherosclerosis 10/09/2006  . GERD 10/09/2006  . DM (diabetes mellitus) type II uncontrolled with retinopathy 06/10/2006    Evelene CroonKelsey M Parsons, PTA 02/03/2016, 12:05 PM  [name] [address] [place], [place], [address] Phone: [phone]   Fax:  [phone]  Name: Doretha ImusDonald W Schimming MRN: [id number] Date of Birth: 1945-05-02

## 2016-02-06 ENCOUNTER — Encounter: Payer: Self-pay | Admitting: Physical Therapy

## 2016-02-06 ENCOUNTER — Ambulatory Visit: Payer: Medicare Other | Admitting: Physical Therapy

## 2016-02-06 DIAGNOSIS — R2689 Other abnormalities of gait and mobility: Secondary | ICD-10-CM | POA: Diagnosis not present

## 2016-02-06 DIAGNOSIS — R260 Ataxic gait: Secondary | ICD-10-CM

## 2016-02-06 DIAGNOSIS — M6281 Muscle weakness (generalized): Secondary | ICD-10-CM | POA: Diagnosis not present

## 2016-02-06 NOTE — Therapy (Signed)
[facility] tical partyV<[id number]>39land K tute Fo [id number] onda HuskytaMerrily 35NewMardella Kinnie ScManus [place] Kit -[id number]  Period Weeks   Status Achieved  50/56 02/01/16           PT Long Term Goals - 02/01/16 1419      PT LONG TERM GOAL #1   Title Independent with an advanced HEP.   Time 8   Period Weeks   Status On-going     PT LONG TERM GOAL #2   Title Increase bilateral LE strength to a solid 4+/5 to increase stability for functional activites.   Time 8   Period Weeks   Status On-going     PT LONG TERM GOAL #3   Title Walk in clinic with QC 500 feet with supervision only.   Time 8   Period Weeks   Status On-going     PT LONG TERM GOAL #4   Title Perform a reciprocating stair gait with one railing.   Time 8   Period Weeks   Status On-going     PT LONG TERM GOAL #5   Title Increase Berg score to 46-47/56.   Time 8   Period Weeks   Status Achieved  50/56 02/01/16               Plan - 02/06/16 1423    Clinical Impression Statement Patient progresiing slowly  due to a fall today at home. He reported he lost balance after he missed a step.Patient did not have walker with him when he fell. Educated patient on safety and assist device today.  Patient required rest breaks and SBA/CGA today. Patient had a few LOB with balance exercises today was able to recover with assist. Patient progressing toward goals yet ongoing due to balance and strength deficits.   Rehab Potential Fair   PT Frequency 3x / week   PT Duration 8 weeks   PT Treatment/Interventions ADLs/Self Care Home Management;Functional mobility training;Stair training;Gait training;Therapeutic activities;Therapeutic exercise;Balance training;Neuromuscular re-education;Patient/family education   PT Next Visit Plan cont with POC per MPT   Consulted and Agree with Plan of Care Patient      Patient will benefit from skilled therapeutic intervention in order to improve the following deficits and impairments:  Abnormal gait, Decreased activity tolerance, Decreased balance, Decreased coordination, Decreased endurance, Decreased mobility, Decreased strength, Difficulty walking, Pain  Visit Diagnosis: Muscle weakness (generalized)  Other abnormalities of gait and mobility  Ataxic gait     Problem List Patient Active Problem List   Diagnosis Date Noted  . Insomnia 02/02/2016  . Atrial fibrillation (HCC) 10/27/2015  . Uncoordinated movements 02/24/2014  . Unspecified hereditary and idiopathic peripheral neuropathy 02/24/2013  . Low back pain 02/24/2013  . Abnormality of gait 02/09/2013  . Memory loss, short term 12/05/2011  . LUMBAR STRAIN, ACUTE 05/05/2010  . OSA (obstructive sleep apnea) 09/14/2009  . BPH (benign prostatic hyperplasia) 07/27/2009  . CARDIOVASCULAR STUDIES, ABNORMAL 07/27/2009  . CHEST PAIN 06/08/2009  . OSTEOARTHRITIS 05/30/2009  . HEEL PAIN, LEFT 05/24/2009  . Dyspnea 05/24/2009  . ADJUSTMENT DISORDER WITH MIXED FEATURES 08/25/2008  . Diabetes type 2, uncontrolled  (HCC) 02/24/2008  . ERECTILE DYSFUNCTION 02/10/2007  . Hyperlipidemia 10/09/2006  . OBESITY 10/09/2006  . Essential hypertension 10/09/2006  . Coronary atherosclerosis 10/09/2006  . GERD 10/09/2006  . DM (diabetes mellitus) type II uncontrolled with retinopathy 06/10/2006    Alison Kubicki P, PTA 02/06/2016, 2:40 PM  [name] [address] [place], [place], [address] Phone: [phone]   Fax:  [phone]  Name: Luis Yu MRN: [id number] Date of Birth: 05-Jun-1945

## 2016-02-07 ENCOUNTER — Ambulatory Visit: Payer: Medicare Other | Admitting: *Deleted

## 2016-02-07 DIAGNOSIS — M6281 Muscle weakness (generalized): Secondary | ICD-10-CM | POA: Diagnosis not present

## 2016-02-07 DIAGNOSIS — R260 Ataxic gait: Secondary | ICD-10-CM | POA: Diagnosis not present

## 2016-02-07 DIAGNOSIS — R2689 Other abnormalities of gait and mobility: Secondary | ICD-10-CM | POA: Diagnosis not present

## 2016-02-07 NOTE — Therapy (Signed)
[name] [address] [place], [place], [address] Phone: [phone]   Fax:  [phone]  Physical Therapy Treatment  Patient Details  Name: Luis ImusDonald W Pilson MRN: [id number] Date of Birth: 03/13/1946 Referring Provider: Shirline Freesory Nafziger MD.  Encounter Date: 02/07/2016      PT End of Session - 02/07/16 1416    Visit Number 14   Number of Visits 24   Date for PT Re-Evaluation 03/04/16   PT Start Time 1348   PT Stop Time 1434   PT Time Calculation (min) 46 min      Past Medical History:  Diagnosis Date  . Adjustment disorder with mixed emotional features   . Atrial flutter (HCC)   . CAD (coronary artery disease)   . Depression   . Diabetes mellitus type II, uncontrolled (HCC)   . Diabetic peripheral neuropathy (HCC)   . Erectile dysfunction   . GERD (gastroesophageal reflux disease)   . Herpes zoster   . Hyperlipidemia   . Hypertension   . Obesity   . Osteoarthritis   . Postherpetic neuralgia   . Sleep apnea     Past Surgical History:  Procedure Laterality Date  . CARDIOVERSION N/A 11/10/2015   Procedure: CARDIOVERSION;  Surgeon: Laurey Moralealton S McLean, MD;  [facility];  Service: Cardiovascular;  Laterality: N/A;  . CORONARY ARTERY BYPASS GRAFT  1996   eith a LIMA to the LAD, saphenous vein graft to the acute marginal, saphenous vein graft to the PDA and saphenous vein graft to the circumflex.  [name]. KNEE ARTHROSCOPY  05/2004   right knee  . REPLACEMENT TOTAL KNEE BILATERAL    . SHOULDER SURGERY      There were no vitals filed for this visit.      Subjective Assessment - 02/07/16 1411    Subjective Doing ok today, just a little SOB   Patient is accompained by: Family member   Pertinent History Recent falls.  Peripheral neuropathy.   Limitations Walking   Patient Stated Goals Walk better and improve balance.                         [facility] Adult PT Treatment/Exercise - 02/07/16 0001      Knee/Hip  Exercises: Aerobic   Nustep 15min L5 UE/LE monitored for progression             Balance Exercises - 02/07/16 1412      Balance Exercises: Standing   Standing Eyes Opened Narrow base of support (BOS);Foam/compliant surface  UE diagonals 4x 20 with SBA   Tandem Stance Eyes open;Foam/compliant surface  x3 min   Rockerboard EO  x 5 mins SBA/CGA   Step Ups Forward;6 inch;Intermittent UE support  4x10             PT Short Term Goals - 02/01/16 1418      PT SHORT TERM GOAL #1   Title Independent with an initial HEP.   Time 4   Period Weeks   Status On-going     PT SHORT TERM GOAL #2   Title Increase Berg score to 32/56.   Time 4   Period Weeks   Status Achieved  50/56 02/01/16           PT Long Term Goals - 02/01/16 1419      PT LONG TERM GOAL #1   Title Independent with an advanced HEP.   Time 8   Period Weeks   Status On-going  PT LONG TERM GOAL #2   Title Increase bilateral LE strength to a solid 4+/5 to increase stability for functional activites.   Time 8   Period Weeks   Status On-going     PT LONG TERM GOAL #3   Title Walk in clinic with QC 500 feet with supervision only.   Time 8   Period Weeks   Status On-going     PT LONG TERM GOAL #4   Title Perform a reciprocating stair gait with one railing.   Time 8   Period Weeks   Status On-going     PT LONG TERM GOAL #5   Title Increase Berg score to 46-47/56.   Time 8   Period Weeks   Status Achieved  50/56 02/01/16               Plan - 02/07/16 1427    Clinical Impression Statement Pt did fair with PT Rx today, but continues to be a little sore from fall at home. He was able to complete all exs and activities for balance and strengthening today. He still needs SBA and CGA at all times when ambulating without AD. He did a little better today with toe taps on 6 in step with only 2 LOBs. LTGs are ongoing   Rehab Potential Fair   PT Frequency 3x / week   PT Duration 8 weeks    PT Treatment/Interventions ADLs/Self Care Home Management;Functional mobility training;Stair training;Gait training;Therapeutic activities;Therapeutic exercise;Balance training;Neuromuscular re-education;Patient/family education   PT Next Visit Plan cont with POC per MPT   PT Home Exercise Plan issue HEP   Consulted and Agree with Plan of Care Patient      Patient will benefit from skilled therapeutic intervention in order to improve the following deficits and impairments:  Abnormal gait, Decreased activity tolerance, Decreased balance, Decreased coordination, Decreased endurance, Decreased mobility, Decreased strength, Difficulty walking, Pain  Visit Diagnosis: Muscle weakness (generalized)  Other abnormalities of gait and mobility  Ataxic gait     Problem List Patient Active Problem List   Diagnosis Date Noted  . Insomnia 02/02/2016  . Atrial fibrillation (HCC) 10/27/2015  . Uncoordinated movements 02/24/2014  . Unspecified hereditary and idiopathic peripheral neuropathy 02/24/2013  . Low back pain 02/24/2013  . Abnormality of gait 02/09/2013  . Memory loss, short term 12/05/2011  . LUMBAR STRAIN, ACUTE 05/05/2010  . OSA (obstructive sleep apnea) 09/14/2009  . BPH (benign prostatic hyperplasia) 07/27/2009  . CARDIOVASCULAR STUDIES, ABNORMAL 07/27/2009  . CHEST PAIN 06/08/2009  . OSTEOARTHRITIS 05/30/2009  . HEEL PAIN, LEFT 05/24/2009  . Dyspnea 05/24/2009  . ADJUSTMENT DISORDER WITH MIXED FEATURES 08/25/2008  . Diabetes type 2, uncontrolled (HCC) 02/24/2008  . ERECTILE DYSFUNCTION 02/10/2007  . Hyperlipidemia 10/09/2006  . OBESITY 10/09/2006  . Essential hypertension 10/09/2006  . Coronary atherosclerosis 10/09/2006  . GERD 10/09/2006  . DM (diabetes mellitus) type II uncontrolled with retinopathy 06/10/2006    Milam Allbaugh,CHRIS, PTA 02/07/2016, 2:43 PM  [name] [address] [place], [place], [address] Phone:  [phone]   Fax:  [phone]  Name: Luis ImusDonald W Kuhar MRN: [id number] Date of Birth: 1945-10-24

## 2016-02-08 ENCOUNTER — Encounter: Payer: Self-pay | Admitting: Podiatry

## 2016-02-08 ENCOUNTER — Telehealth: Payer: Self-pay | Admitting: *Deleted

## 2016-02-08 ENCOUNTER — Ambulatory Visit (INDEPENDENT_AMBULATORY_CARE_PROVIDER_SITE_OTHER): Payer: Medicare Other | Admitting: Podiatry

## 2016-02-08 VITALS — Ht 75.0 in | Wt 237.0 lb

## 2016-02-08 DIAGNOSIS — B351 Tinea unguium: Secondary | ICD-10-CM

## 2016-02-08 DIAGNOSIS — E1140 Type 2 diabetes mellitus with diabetic neuropathy, unspecified: Secondary | ICD-10-CM

## 2016-02-08 DIAGNOSIS — M79609 Pain in unspecified limb: Secondary | ICD-10-CM | POA: Diagnosis not present

## 2016-02-08 DIAGNOSIS — Z794 Long term (current) use of insulin: Secondary | ICD-10-CM

## 2016-02-08 NOTE — Telephone Encounter (Signed)
No paperwork denied due to not being signed by an MD

## 2016-02-08 NOTE — Progress Notes (Signed)
Patient ID: Luis Yu, male   DOB: 05/25/1945, 70 y.o.   MRN: [id number] Complaint:  Visit Type: Patient returns to my office for continued preventative foot care services. Complaint: Patient states" my nails have grown long and thick and become painful to walk and wear shoes" Patient has been diagnosed with DM with neuropathy. The patient presents for preventative foot care services. No changes to ROS. Patient has been sick and under treatment for atrial fib.  Podiatric Exam: Vascular: dorsalis pedis and posterior tibial pulses are palpable bilateral. Capillary return is immediate. Temperature gradient is WNL. Skin turgor WNL  Sensorium: Normal Semmes Weinstein monofilament test. Normal tactile sensation bilaterally. Nail Exam: Pt has thick disfigured discolored nails with subungual debris noted bilateral entire nail hallux through fifth toenails Ulcer Exam: There is no evidence of ulcer or pre-ulcerative changes or infection. Orthopedic Exam: Muscle tone and strength are WNL. No limitations in general ROM. No crepitus or effusions noted. HAV 1st MPJ  B/L Skin: No Porokeratosis. No infection or ulcers.    Diagnosis:  Onychomycosis, , Pain in right toe, pain in left toes  Treatment & Plan Procedures and Treatment: Consent by patient was obtained for treatment procedures. The patient understood the discussion of treatment and procedures well. All questions were answered thoroughly reviewed. Debridement of mycotic and hypertrophic toenails, 1 through 5 bilateral and clearing of subungual debris. No ulceration, no infection noted.  Return Visit-Office Procedure: Patient instructed to return to the office for a follow up visit 3 months for continued evaluation and treatment.    Karena Kinker DPM 

## 2016-02-08 NOTE — Telephone Encounter (Signed)
Pt wife called to schedule appt. For diabetic shoes measurement just wondering if paperwork is in the works.

## 2016-02-12 ENCOUNTER — Ambulatory Visit (HOSPITAL_BASED_OUTPATIENT_CLINIC_OR_DEPARTMENT_OTHER): Payer: Medicare Other | Attending: Pulmonary Disease | Admitting: Pulmonary Disease

## 2016-02-12 VITALS — Ht 74.0 in | Wt 237.0 lb

## 2016-02-12 DIAGNOSIS — G4733 Obstructive sleep apnea (adult) (pediatric): Secondary | ICD-10-CM | POA: Diagnosis not present

## 2016-02-12 DIAGNOSIS — Z7901 Long term (current) use of anticoagulants: Secondary | ICD-10-CM | POA: Insufficient documentation

## 2016-02-12 DIAGNOSIS — Z9989 Dependence on other enabling machines and devices: Secondary | ICD-10-CM

## 2016-02-12 DIAGNOSIS — Z7984 Long term (current) use of oral hypoglycemic drugs: Secondary | ICD-10-CM | POA: Diagnosis not present

## 2016-02-12 DIAGNOSIS — Z79899 Other long term (current) drug therapy: Secondary | ICD-10-CM | POA: Diagnosis not present

## 2016-02-13 ENCOUNTER — Ambulatory Visit: Payer: Medicare Other | Admitting: Physical Therapy

## 2016-02-13 ENCOUNTER — Telehealth: Payer: Self-pay | Admitting: Pulmonary Disease

## 2016-02-13 NOTE — Telephone Encounter (Signed)
We have only seen him for sleep issues We can make appointment with NP for shortness of breath or he can contact his PCP

## 2016-02-13 NOTE — Telephone Encounter (Signed)
Called and spoke to pt's wife. Pt c/o increase in SOB and increase in nonprod cough without chest congestion, worse since last OV. Pt denies CP/tightness, f/c/s, and swelling. Pt's wife states she contacted pt's PCP about s/s and was advised to contact pulmonologist, advised pt's wife that RA has only seen pt for sleep. Pt's wife insisted a message be sent to RA.   Dr. Vassie LollAlva please advise. Thanks.

## 2016-02-13 NOTE — Telephone Encounter (Signed)
lmtcb x1 for pt's wife. 

## 2016-02-13 NOTE — Telephone Encounter (Signed)
Pt wife returning call.Stanley A Dalton' °

## 2016-02-13 NOTE — Telephone Encounter (Signed)
Called and spoke with pts wife and she is aware of appt with SG on 11/29 at 4. Nothing further is needed.

## 2016-02-15 ENCOUNTER — Encounter: Payer: Self-pay | Admitting: Acute Care

## 2016-02-15 ENCOUNTER — Telehealth: Payer: Self-pay | Admitting: Pulmonary Disease

## 2016-02-15 ENCOUNTER — Encounter (INDEPENDENT_AMBULATORY_CARE_PROVIDER_SITE_OTHER): Payer: Self-pay

## 2016-02-15 ENCOUNTER — Ambulatory Visit (INDEPENDENT_AMBULATORY_CARE_PROVIDER_SITE_OTHER): Payer: Medicare Other | Admitting: Acute Care

## 2016-02-15 ENCOUNTER — Other Ambulatory Visit (INDEPENDENT_AMBULATORY_CARE_PROVIDER_SITE_OTHER): Payer: Medicare Other

## 2016-02-15 ENCOUNTER — Ambulatory Visit: Payer: Medicare Other | Admitting: Physical Therapy

## 2016-02-15 ENCOUNTER — Ambulatory Visit (INDEPENDENT_AMBULATORY_CARE_PROVIDER_SITE_OTHER)
Admission: RE | Admit: 2016-02-15 | Discharge: 2016-02-15 | Disposition: A | Discharge status: Home / Self Care | Payer: Medicare Other | Source: Ambulatory Visit | Attending: Acute Care | Admitting: Acute Care

## 2016-02-15 VITALS — BP 140/74 | HR 60 | Ht 74.0 in | Wt 239.0 lb

## 2016-02-15 DIAGNOSIS — R296 Repeated falls: Secondary | ICD-10-CM | POA: Diagnosis not present

## 2016-02-15 DIAGNOSIS — R0600 Dyspnea, unspecified: Secondary | ICD-10-CM

## 2016-02-15 DIAGNOSIS — R0602 Shortness of breath: Secondary | ICD-10-CM

## 2016-02-15 DIAGNOSIS — R05 Cough: Secondary | ICD-10-CM | POA: Diagnosis not present

## 2016-02-15 DIAGNOSIS — G4731 Primary central sleep apnea: Secondary | ICD-10-CM

## 2016-02-15 DIAGNOSIS — G4733 Obstructive sleep apnea (adult) (pediatric): Secondary | ICD-10-CM

## 2016-02-15 DIAGNOSIS — I25810 Atherosclerosis of coronary artery bypass graft(s) without angina pectoris: Secondary | ICD-10-CM | POA: Diagnosis not present

## 2016-02-15 LAB — CBC WITH DIFFERENTIAL/PLATELET
Basophils Absolute: 0 10*3/uL (ref 0.0–0.1)
Basophils Relative: 0.2 % (ref 0.0–3.0)
Eosinophils Absolute: 0.4 10*3/uL (ref 0.0–0.7)
Eosinophils Relative: 5.1 % — ABNORMAL HIGH (ref 0.0–5.0)
HCT: 34.3 % — ABNORMAL LOW (ref 39.0–52.0)
Hemoglobin: 11 g/dL — ABNORMAL LOW (ref 13.0–17.0)
Lymphocytes Relative: 12.6 % (ref 12.0–46.0)
Lymphs Abs: 0.9 10*3/uL (ref 0.7–4.0)
MCHC: 32.2 g/dL (ref 30.0–36.0)
MCV: 93.8 fl (ref 78.0–100.0)
Monocytes Absolute: 0.6 10*3/uL (ref 0.1–1.0)
Monocytes Relative: 8.5 % (ref 3.0–12.0)
Neutro Abs: 5.2 10*3/uL (ref 1.4–7.7)
Neutrophils Relative %: 73.6 % (ref 43.0–77.0)
Platelets: 151 10*3/uL (ref 150.0–400.0)
RBC: 3.65 Mil/uL — ABNORMAL LOW (ref 4.22–5.81)
RDW: 19.4 % — ABNORMAL HIGH (ref 11.5–15.5)
WBC: 7.1 10*3/uL (ref 4.0–10.5)

## 2016-02-15 LAB — COMPREHENSIVE METABOLIC PANEL
ALT: 17 U/L (ref 0–53)
AST: 16 U/L (ref 0–37)
Albumin: 3.9 g/dL (ref 3.5–5.2)
Alkaline Phosphatase: 93 U/L (ref 39–117)
BUN: 23 mg/dL (ref 6–23)
CO2: 30 mEq/L (ref 19–32)
Calcium: 8.9 mg/dL (ref 8.4–10.5)
Chloride: 102 mEq/L (ref 96–112)
Creatinine, Ser: 1.15 mg/dL (ref 0.40–1.50)
GFR: 66.5 mL/min (ref >60.00)
Glucose, Bld: 149 mg/dL — ABNORMAL HIGH (ref 70–99)
Potassium: 4.2 mEq/L (ref 3.5–5.1)
Sodium: 139 mEq/L (ref 135–145)
Total Bilirubin: 1.2 mg/dL (ref 0.2–1.2)
Total Protein: 6.4 g/dL (ref 6.0–8.3)

## 2016-02-15 LAB — BRAIN NATRIURETIC PEPTIDE: Pro B Natriuretic peptide (BNP): 247 pg/mL — ABNORMAL HIGH (ref 0.0–100.0)

## 2016-02-15 NOTE — Assessment & Plan Note (Signed)
Worsening since cardioversion for A fib 10/2015 Chronic Eliquis without missed dosing per wife EF  10/2015>> 55-60% Plan: CXR 2D Echo EKG noe Referral to Cardiology CT Angio CBC, CMET,BNP PFT's Pro Air Inhaler or albuterol Nebs q 4-6 hours for wheezing/ shortness of breath( Whichever is cheaper for pt per insurance) Reminded that albuterol may cause heart stimulation and to use only when needed. Follow up appointment with Dr. Sherene SiresWert in 2 weeks

## 2016-02-15 NOTE — Progress Notes (Addendum)
History of Present Illness Luis Yu is a 70 y.o. male  Diabetic, never smoker  With OSA, wears CPAP, HTN, CAD s/p CABG, and Atrial Fib/ Flutter on chronic anticoagulation. He  followed by Dr. Vassie Loll.    02/15/2016 Acute OV for SOB. Pt. Presents to the office today with complaint of shortness of breath since August 2017. He is followed by Dr. Vassie Loll for sleep only. We have not seen him for Dyspnea/ pulmonary issues before. He states that he has had shortness of breath since diagnosis with atrial fib and  cardioversion in August of 2017.( Sees [facility] for Cards, last EF 55-60% 8/17) Pt. States that his shortness of breath is  with minimal exertion. He is getting out of breath with conversation.He states he cannot walk any distance without extreme dyspnea.He saw Dr. Vassie Loll 02/02/2016, and there was concern that his worsening dyspnea was due to worsening central apnea. He had a sleep study done 02/12/2016, which did indicate worsening central sleep apnea.( results below). He needs to be scheduled for follow up sleep study on adaptive servo ventilation for treatment emergent CSA (complex ).  He states that he  has had several falls recently. Neuro assessment in the office today was WNL while seated, but he had weakness with ambulation.He was referred to Physical Therapy for falls and deconditioning, but treatment  has been put on hold due to lack of progress. He is non-compliant with his walker use per his wife. Saturations in the office today were 96%. We attempted an ambulatory oximetry, but the patient almost fell due to weakness of his legs. Saturations did not drop below 90% before we had to stop the test. Orthostatic BP was checked and did not fall below 120 systolic. I had Dr. Craige Cotta staff this patient with me as the patient had not been seen for Pulmonary issues in the past. He denies any chest pain, fever, or hemoptysis. He denies leg or calf pain, recent air or auto travel.His wife insists he has not  missed any Eliquis dosing.  Significant Events:  11/10/2015:Cardioversion for a-fib flutter with cough noted post procedure   Tests:  02/15/2016: Oximetry HGB: 11.0 Ambulatory Oximetry ( Discontinued due to weakness) Lowest saturation>>90%  02/15/16: EKG Atrial Rhythm with first degree heart block, HR =60  02/15/2016:CXR: Personally reviewed by me  Cardiac shadow is stable. Postsurgical changes are again seen. The lungs are well aerated bilaterally. No focal infiltrate or sizable effusion is seen. No acute bony abnormality is noted.  IMPRESSION: No acute abnormality seen.   Sleep Study 02/12/2016:  IMPRESSIONS - An optimal PAP pressure was not selected for this patient. - Moderate Central Sleep Apnea was noted during this titration (CAI = 28.7/h). Central apneas persisted on CPAP & bilevel  ( 15 / 11 cm of water) - Severe oxygen desaturations were observed during this titration (min O2 = 0.00%). - The patient snored with Moderate snoring volume. - No cardiac abnormalities were observed during this study. - Clinically significant periodic limb movements were not noted during this study. Arousals associated with PLMs were rare.  DIAGNOSIS - Obstructive Sleep Apnea (327.23 [G47.33 ICD-10])  RECOMMENDATIONS - Trial of ASV (adaptive servo ventilation) for treatment emergent CSA (complex ). Of note, his echo shows preserved EF. - Avoid alcohol, sedatives and other CNS depressants that may worsen sleep apnea and disrupt normal sleep architecture. - Sleep hygiene should be reviewed to assess factors that may improve sleep quality. - Weight management and regular exercise should be initiated  or continued. - Return to Sleep Center for re-evaluation after 4 weeks of therapy  10/2015 Echo Left ventricle: The cavity size was normal. Wall thickness was   increased in a pattern of mild LVH. Systolic function was normal.   The estimated ejection fraction was in the range of 55%  to 60%.   Wall motion was normal; there were no regional wall motion   abnormalities. - Left atrium: The atrium was moderately dilated. - Right atrium: The atrium was moderately dilated.     Pulmonary Embolism Wells Score:( Pt is on Eliquis 5 mg BID with no missed doses per wife) Select Criteria:  Symptoms of DVT (3 points)=0  No alternative diagnosis better explains the illness (3 points)=3  Tachycardia with pulse > 100 (1.5 points)=0 but may be masked due to Bystolic  Immobilization (>= 3 days) or surgery in the previous four weeks (1.5 points)=0  Prior history of DVT or pulmonary embolism (1.5 points)= 0  Presence of hemoptysis (1 point) =0  Presence of malignancy (1 point) =0 None known Results: Total Criteria Point Count:  3  Low to Moderate Probability PE    Pulmonary Embolism Risk Score Interpretation Score > 6: High probability Score >= 2 and <= 6: Moderate probability Score < 2: Low Probability       Past medical hx Past Medical History:  Diagnosis Date  . Adjustment disorder with mixed emotional features   . Atrial flutter (HCC)   . CAD (coronary artery disease)   . Depression   . Diabetes mellitus type II, uncontrolled (HCC)   . Diabetic peripheral neuropathy (HCC)   . Erectile dysfunction   . GERD (gastroesophageal reflux disease)   . Herpes zoster   . Hyperlipidemia   . Hypertension   . Obesity   . Osteoarthritis   . Postherpetic neuralgia   . Sleep apnea      Past surgical hx, Family hx, Social hx all reviewed.  Current Outpatient Prescriptions on File Prior to Visit  Medication Sig  . apixaban (ELIQUIS) 5 MG TABS tablet Take 1 tablet (5 mg total) by mouth 2 (two) times daily.  [name]. aspirin EC 81 MG tablet Take 1 tablet (81 mg total) by mouth daily.  [name]. atorvastatin (LIPITOR) 80 MG tablet Take 1 tablet (80 mg total) by mouth daily.  [name]. buPROPion (WELLBUTRIN SR) 150 MG 12 hr tablet Take 150 mg by mouth 2 (two) times daily.   . Cyanocobalamin  (B-12) 5000 MCG SUBL Place 1 tablet under the tongue once a week.  . fluticasone (FLONASE) 50 MCG/ACT nasal spray Place 2 sprays into both nostrils daily as needed for allergies.   [name]. gabapentin (NEURONTIN) 300 MG capsule Take 600 mg by mouth 2 (two) times daily.  [name]. glipiZIDE (GLUCOTROL) 10 MG tablet Take 1 tablet (10 mg total) by mouth 2 (two) times daily before a meal.  . glucose blood (ACCU-CHEK ACTIVE STRIPS) test strip Use as instructed  . Insulin Pen Needle (PEN NEEDLES 31GX5/16") 31G X 8 MM MISC Use to inject insulin 5 times a day.  . isosorbide mononitrate (IMDUR) 30 MG 24 hr tablet Take 1 tablet (30 mg total) by mouth daily.  . Lancets (ACCU-CHEK MULTICLIX) lancets Use up to 5 times a day to check blood sugar.  . lisinopril-hydrochlorothiazide (PRINZIDE,ZESTORETIC) 20-12.5 MG tablet TAKE 1 TABLET BY MOUTH EVERY DAY  . metFORMIN (GLUCOPHAGE) 1000 MG tablet Take 1 tablet (1,000 mg total) by mouth 2 (two) times daily with a meal.  . Nebivolol  HCl 20 MG TABS Take 1 tablet (20 mg total) by mouth daily.  [name]. NOVOLOG FLEXPEN 100 UNIT/ML FlexPen USE 15 TO 30 UNITS 15 MINUTES BEFORE MEALS 3 TIMES DAILY  . omeprazole (PRILOSEC) 20 MG capsule TAKE ONE CAPSULE TWICE A DAY BEFORE A MEAL  . TOUJEO SOLOSTAR 300 UNIT/ML SOPN INJECT 30 UNITS INTO THE SKIN AT BEDTIME.   Current Facility-Administered Medications on File Prior to Visit  Medication  . ipratropium-albuterol (DUONEB) 0.5-2.5 (3) MG/3ML nebulizer solution 3 mL  . pneumococcal 13-valent conjugate vaccine (PREVNAR 13) injection 0.5 mL     No Known Allergies  Review Of Systems:  Constitutional:   No  weight loss, night sweats,  Fevers, chills, +fatigue, or  lassitude.  HEENT:   No headaches,  Difficulty swallowing,  Tooth/dental problems, or  Sore throat,                No sneezing, itching, ear ache, nasal congestion, post nasal drip,   CV:  No chest pain,  +Orthopnea,no PND, swelling in lower extremities, anasarca, dizziness,  palpitations, syncope.   GI  No heartburn, indigestion, abdominal pain, nausea, vomiting, diarrhea, change in bowel habits, loss of appetite, bloody stools.   Resp: + shortness of breath with exertion and at rest with conversation.  No excess mucus, + productive cough,  No non-productive cough,  No coughing up of blood.  No change in color of mucus.  +wheezing.  No chest wall deformity  Skin: no rash or lesions.  GU: no dysuria, change in color of urine, no urgency or frequency.  No flank pain, no hematuria   MS:  No joint pain or swelling.  No decreased range of motion.  No back pain.  Psych:  No change in mood or affect. No depression or anxiety.  No memory loss.   Vital Signs BP 140/74 (BP Location: Left Arm, Cuff Size: Normal)   Pulse 60   Ht 6\' 2"  (1.88 m)   Wt 239 lb (108.4 kg)   SpO2 96%   BMI 30.69 kg/m    Physical Exam:  General- No distress,  A&Ox3. Anxious, HOH ENT: No sinus tenderness, TM clear, pale nasal mucosa, no oral exudate,no post nasal drip, no LAN, HOH Cardiac: S1, S2, regular rate and rhythm, no murmur Chest: No wheeze/ rales/ dullness; no accessory muscle use, no nasal flaring, no sternal retractions Abd.: Soft Non-tender Ext: No clubbing cyanosis, edema Neuro:  normal strength, resistance x 4 extremities, but weak while ambulating, deconditioned Skin: No rashes, warm and dry Psych: Anxious   Assessment/Plan  No problem-specific Assessment & Plan notes found for this encounter.  Dyspnea: Worsening since cardioversion for A fib 10/2015 Chronic Eliquis without missed dosing per wife EF  10/2015>> 55-60% Plan: CXR 2D Echo EKG noe Referral to Cardiology CT Angio CBC, CMET,BNP PFT's Pro Air Inhaler or albuterol Nebs q 4-6 hours for wheezing/ shortness of breath( Whichever is cheaper for pt per insurance) Reminded that albuterol may cause heart stimulation and to use only when needed. Follow up appointment with Dr. Sherene SiresWert in 2 weeks  Worsening  Central Sleep Apnea Weakness with ambulation and recent falls on Eliquis Plan: Trial of ASV (adaptive servo ventilation) for treatment emergent CSA (complex ). Please schedule during the day as this is when patient sleeps Continue wearing CPAP until sleep study is scheduled Neuro Consult Follow up with Dr. Sherene SiresWert in 2 weeks   Falls Recent falls per wife in patient on chronic anticoagulation Weakness while ambulating in the  office today Plan: Referral to Neurology Resume PT when able Use walker while ambulating Fall Risk reviewed Bleeding risks reviewed Told to go to ED for any blow to head/ bleeding Follow up with Dr. Sherene Sires in 2 weeks  Bevelyn Ngo, NP 02/15/2016  5:41 PM   Attending note:  He is followed by Dr. Vassie Loll for sleep disordered breathing.  Recent study showed significant central apnea.  He has persistent symptoms of dyspnea.  He feels short of breath at rest while talking.  He also has episodes of coughing.  He reports some improvement with inhaler therapy.  He reports having several falls recently after walking and then getting short of breath.  Anxious, gasp for breath between sentences.  HR regular, no murmur.  No wheeze/rales.  Normal strength.  No edema.  CXR - post CABG changes CT chest 12/07/15 - 3 mm calcified nodule Rt major fissure, 3 mm nodule RUL, Rt 10th rib fx Echo 11/01/15 - mild LVH, EF 55 to 60% BNP 247 Hb 11.0  Assessment/plan:  Dyspnea on exertion. - his symptoms are out of proportion to clinical findings - he does have change in ECG - will arrange for Echo, refer back to cardiology - CT angio chest, PFT - trial of albuterol prn  Central sleep apnea. - arrange for titration study with ASV  Coralyn Helling, MD [facility] Pulmonary/Critical Care 02/15/2016, 8:14 PM Pager:  [phone]

## 2016-02-15 NOTE — Telephone Encounter (Signed)
He had persistent centrals on titration study -even on bipap He needs a special machine called ASV Will need toSchedule ASV titration study first to see if this machine takes care of his apneas

## 2016-02-15 NOTE — Procedures (Signed)
Patient Name: Luis Yu, [name] Study Date: 02/12/2016 Gender: Male D.O.B: 06/27/1945 Age (years): [age] Referring Provider: Cyril Mourningakesh Alva MD, ABSM Height (inches): 74 Interpreting Physician: Cyril Mourningakesh Alva MD, ABSM Weight (lbs): 237 RPSGT: Rolene ArbourMcConnico, Yvonne BMI: 30 MRN: [id number] Neck Size: 16.00   CLINICAL INFORMATION The patient is referred for a BiPAP titration to treat sleep apnea.  Date of NPSG: PSG 6/27 /11  - wt 246 lbs -showed severe obstructive sleep apnea with AHI 50/h & desaturation to 87%, corrected by CPAP 10 cm    SLEEP STUDY TECHNIQUE As per the AASM Manual for the Scoring of Sleep and Associated Events v2.3 (April 2016) with a hypopnea requiring 4% desaturations. The channels recorded and monitored were frontal, central and occipital EEG, electrooculogram (EOG), submentalis EMG (chin), nasal and oral airflow, thoracic and abdominal wall motion, anterior tibialis EMG, snore microphone, electrocardiogram, and pulse oximetry. Bilevel positive airway pressure (BPAP) was initiated at the beginning of the study and titrated to treat sleep-disordered breathing.   MEDICATIONS Medications self-administered by patient taken the night of the study : GABAPENTIN, GLIPIZIDE, METFORMIN, LIPITOR, ELIQUIS, BYSTOLIC, WELBUTRIN   RESPIRATORY PARAMETERS Optimal IPAP Pressure (cm): 15 AHI at Optimal Pressure (/hr) 47.5 Optimal EPAP Pressure (cm): 11   Overall Minimal O2 (%): 0.00 Minimal O2 at Optimal Pressure (%): 88.0   SLEEP ARCHITECTURE Start Time: 10:03:06 PM Stop Time: 5:14:56 AM Total Time (min): 431.8 Total Sleep Time (min): 146.1 Sleep Latency (min): 37.2 Sleep Efficiency (%): 33.8 REM Latency (min): 182.5 WASO (min): 248.5 Stage N1 (%): 14.37 Stage N2 (%): 71.25 Stage N3 (%): 0.00 Stage R (%): 14.37 Supine (%): 15.23 Arousal Index (/hr): 17.2       CARDIAC DATA The 2 lead EKG demonstrated sinus rhythm. The mean heart rate was 51.17 beats per minute. Other EKG findings include:  None.   LEG MOVEMENT DATA The total Periodic Limb Movements of Sleep (PLMS) were 0. The PLMS index was 0.00. A PLMS index of <15 is considered normal in adults.   IMPRESSIONS - An optimal PAP pressure was not selected for this patient. - Moderate Central Sleep Apnea was noted during this titration (CAI = 28.7/h). Central apneas persisted on CPAP & bilevel  ( 15 / 11 cm of water) - Severe oxygen desaturations were observed during this titration (min O2 = 0.00%). - The patient snored with Moderate snoring volume. - No cardiac abnormalities were observed during this study. - Clinically significant periodic limb movements were not noted during this study. Arousals associated with PLMs were rare.   DIAGNOSIS - Obstructive Sleep Apnea (327.23 [G47.33 ICD-10])   RECOMMENDATIONS - Trial of ASV (adaptive servo ventilation) for treatment emergent CSA (complex ). Of note, his echo shows preserved EF. - Avoid alcohol, sedatives and other CNS depressants that may worsen sleep apnea and disrupt normal sleep architecture. - Sleep hygiene should be reviewed to assess factors that may improve sleep quality. - Weight management and regular exercise should be initiated or continued. - Return to Sleep Center for re-evaluation after 4 weeks of therapy   Cyril Mourningakesh Alva MD Board Certified in Sleep medicine

## 2016-02-15 NOTE — Assessment & Plan Note (Signed)
Worsening Central Sleep Apnea Plan: Trial of ASV (adaptive servo ventilation) for treatment emergent CSA (complex ). Please schedule during the day as this is when patient sleeps Continue wearing CPAP until sleep study is scheduled Follow up with Dr. Sherene SiresWert in 2 weeks

## 2016-02-15 NOTE — Patient Instructions (Addendum)
It is nice to meet you today. We are going to schedule several diagnostic tests today to evaluate your dyspnea  Your CXR was normal today. 2D Echo ( We will schedule soon) EKG ( in the office today) [facility], but needs first available) CT Angio Chest ( We will schedule this for tomorrow if possible) You are scheduled for 02/16/2016 at 1:15, arrive at 1 pm. No solid food x 2 hours prior to exam. CMET, BNP,CBC place as STAT ( We will draw these labs in the office today) We will walk you today and monitor your oxygen saturations We will schedule Pulmonary Function Tests We [facility] Please schedule during the day as this is when patient sleeps Please use your walker whenever you are walking as you have had several falls and are on a blood thinner We will refer you to Neurology for evaluation of your falls. Pro Air Albuterol inhaler  2 puffs as needed for shortness of breath or wheezing up to every 6 hours. Follow up with Dr. Sherene SiresWert in 2 weeks. Please contact office for sooner follow up if symptoms do not improve or worsen or seek emergency care

## 2016-02-16 ENCOUNTER — Ambulatory Visit (INDEPENDENT_AMBULATORY_CARE_PROVIDER_SITE_OTHER)
Admission: RE | Admit: 2016-02-16 | Discharge: 2016-02-16 | Disposition: A | Discharge status: Home / Self Care | Payer: Medicare Other | Source: Ambulatory Visit | Attending: Acute Care | Admitting: Acute Care

## 2016-02-16 ENCOUNTER — Telehealth: Payer: Self-pay | Admitting: Acute Care

## 2016-02-16 ENCOUNTER — Ambulatory Visit (HOSPITAL_COMMUNITY): Payer: Medicare Other | Attending: Internal Medicine

## 2016-02-16 ENCOUNTER — Encounter: Payer: Medicare Other | Admitting: Physical Therapy

## 2016-02-16 ENCOUNTER — Other Ambulatory Visit: Payer: Self-pay

## 2016-02-16 DIAGNOSIS — E785 Hyperlipidemia, unspecified: Secondary | ICD-10-CM | POA: Insufficient documentation

## 2016-02-16 DIAGNOSIS — Z951 Presence of aortocoronary bypass graft: Secondary | ICD-10-CM | POA: Insufficient documentation

## 2016-02-16 DIAGNOSIS — R0602 Shortness of breath: Secondary | ICD-10-CM | POA: Insufficient documentation

## 2016-02-16 DIAGNOSIS — E119 Type 2 diabetes mellitus without complications: Secondary | ICD-10-CM | POA: Insufficient documentation

## 2016-02-16 DIAGNOSIS — G4733 Obstructive sleep apnea (adult) (pediatric): Secondary | ICD-10-CM | POA: Insufficient documentation

## 2016-02-16 DIAGNOSIS — R0600 Dyspnea, unspecified: Secondary | ICD-10-CM | POA: Diagnosis not present

## 2016-02-16 DIAGNOSIS — R079 Chest pain, unspecified: Secondary | ICD-10-CM | POA: Diagnosis not present

## 2016-02-16 DIAGNOSIS — I10 Essential (primary) hypertension: Secondary | ICD-10-CM | POA: Diagnosis not present

## 2016-02-16 DIAGNOSIS — I081 Rheumatic disorders of both mitral and tricuspid valves: Secondary | ICD-10-CM | POA: Diagnosis not present

## 2016-02-16 DIAGNOSIS — I2510 Atherosclerotic heart disease of native coronary artery without angina pectoris: Secondary | ICD-10-CM | POA: Insufficient documentation

## 2016-02-16 DIAGNOSIS — I4891 Unspecified atrial fibrillation: Secondary | ICD-10-CM | POA: Insufficient documentation

## 2016-02-16 MED ORDER — PERFLUTREN LIPID MICROSPHERE
1.0000 mL | INTRAVENOUS | Status: AC | PRN
  Administered 2016-02-16: 3 mL via INTRAVENOUS

## 2016-02-16 MED ORDER — IOPAMIDOL (ISOVUE-370) INJECTION 76%
80.0000 mL | Freq: Once | INTRAVENOUS | Status: AC | PRN
  Administered 2016-02-16: 80 mL via INTRAVENOUS

## 2016-02-16 MED ORDER — ALBUTEROL SULFATE HFA 108 (90 BASE) MCG/ACT IN AERS: 2.0000 | INHALATION_SPRAY | Freq: Four times a day (QID) | RESPIRATORY_TRACT | 2 refills | Status: DC | PRN

## 2016-02-16 NOTE — Telephone Encounter (Signed)
Spoke with Luis Yu and notified of recs per SG  She verbalized understanding  She states that proair was never sent to pharm  She checked with ins and they advised that ventolin is preferred  I have sent this to pharm  Nothing further needed

## 2016-02-16 NOTE — Telephone Encounter (Signed)
Please call his wife and tell her we need to get the Echo and CT Angio. Once we have those results we can make a decision about PT and when to resume. Hopefully he can resume next week after all the diagnostics we have scheduled have resulted. Thanks!!

## 2016-02-16 NOTE — Telephone Encounter (Signed)
Spoke with the pt's spouse  She states that the pt has been doing PT for strength and stamina  He has a few visits left  He does okay with PT, but does have to take breaks due to SOB  Spouse is asking if SG thinks that it's appropriate for him to continue with PT  Please advise, thanks

## 2016-02-16 NOTE — Telephone Encounter (Signed)
Wife (glenna) returned phone call.Charm RingsErica R Taylor

## 2016-02-16 NOTE — Telephone Encounter (Signed)
Spoke with Luis RobinsonsSarah Groce, NP she states that Yu was in her office yesterday and he significant dyspnea and h/o Afib, Cardioversion and has significant ECG changes on ECG yesterday.  Yu has CT and Echo today and Maralyn SagoSarah would like Dr Jens Somrenshaw to review and also would like MD to "pop in" [facility] [address] [facility]. Please advise if DOD can check on Yu per Luis RobinsonsSarah Groce, NP request.

## 2016-02-16 NOTE — Telephone Encounter (Signed)
ECG with significant artifact; will need repeat at fu ov12/1 and eval at that time. Luis Yu

## 2016-02-16 NOTE — Telephone Encounter (Signed)
LMTCB for the pt's spouse, [name]

## 2016-02-16 NOTE — Telephone Encounter (Addendum)
I have called [facility] with the request they take a look at the EKG done on this patient yesterday in the pulmonary  Office. I spoke with Marcelino DusterMichelle, LPN who is going to have Dr. Jens Somrenshaw or Dr. Antoine PocheHochrein who is doc of the day  review the EKG. I told her the patient is going to be in [facility] [address] office at 3 pm today for an Echo.The patient is scheduled to see Theodore DemarkRhonda Barrett PA in the office 02/17/2016 ( Tomorrow). The EKG looked different from previous EKG's , the P wave was difficult to see. There was a first degree HB. There was significant artifact due to patient movement.

## 2016-02-16 NOTE — Addendum Note (Signed)
Addended by: Maxwell MarionBLANKENSHIP, MARGIE A on: 02/16/2016 08:12 AM   Modules accepted: Orders

## 2016-02-17 ENCOUNTER — Encounter (HOSPITAL_COMMUNITY): Payer: Self-pay | Admitting: Nurse Practitioner

## 2016-02-17 ENCOUNTER — Other Ambulatory Visit: Payer: Self-pay

## 2016-02-17 ENCOUNTER — Ambulatory Visit (HOSPITAL_COMMUNITY)
Admission: RE | Admit: 2016-02-17 | Discharge: 2016-02-17 | Disposition: A | Discharge status: Home / Self Care | Payer: Medicare Other | Source: Ambulatory Visit | Attending: Nurse Practitioner | Admitting: Nurse Practitioner

## 2016-02-17 ENCOUNTER — Ambulatory Visit (INDEPENDENT_AMBULATORY_CARE_PROVIDER_SITE_OTHER): Payer: Medicare Other | Admitting: Physician Assistant

## 2016-02-17 ENCOUNTER — Encounter: Payer: Self-pay | Admitting: Physician Assistant

## 2016-02-17 VITALS — BP 164/98 | HR 59 | Ht 74.0 in | Wt 240.4 lb

## 2016-02-17 VITALS — BP 140/66 | HR 59 | Ht 74.0 in | Wt 239.0 lb

## 2016-02-17 DIAGNOSIS — Z951 Presence of aortocoronary bypass graft: Secondary | ICD-10-CM | POA: Diagnosis not present

## 2016-02-17 DIAGNOSIS — Z794 Long term (current) use of insulin: Secondary | ICD-10-CM | POA: Insufficient documentation

## 2016-02-17 DIAGNOSIS — M1990 Unspecified osteoarthritis, unspecified site: Secondary | ICD-10-CM | POA: Diagnosis not present

## 2016-02-17 DIAGNOSIS — I4892 Unspecified atrial flutter: Secondary | ICD-10-CM

## 2016-02-17 DIAGNOSIS — Z7951 Long term (current) use of inhaled steroids: Secondary | ICD-10-CM | POA: Diagnosis not present

## 2016-02-17 DIAGNOSIS — E785 Hyperlipidemia, unspecified: Secondary | ICD-10-CM | POA: Insufficient documentation

## 2016-02-17 DIAGNOSIS — I2510 Atherosclerotic heart disease of native coronary artery without angina pectoris: Secondary | ICD-10-CM | POA: Insufficient documentation

## 2016-02-17 DIAGNOSIS — E1142 Type 2 diabetes mellitus with diabetic polyneuropathy: Secondary | ICD-10-CM | POA: Diagnosis not present

## 2016-02-17 DIAGNOSIS — F329 Major depressive disorder, single episode, unspecified: Secondary | ICD-10-CM | POA: Diagnosis not present

## 2016-02-17 DIAGNOSIS — G4730 Sleep apnea, unspecified: Secondary | ICD-10-CM | POA: Insufficient documentation

## 2016-02-17 DIAGNOSIS — I483 Typical atrial flutter: Secondary | ICD-10-CM

## 2016-02-17 DIAGNOSIS — I25810 Atherosclerosis of coronary artery bypass graft(s) without angina pectoris: Secondary | ICD-10-CM

## 2016-02-17 DIAGNOSIS — N529 Male erectile dysfunction, unspecified: Secondary | ICD-10-CM | POA: Diagnosis not present

## 2016-02-17 DIAGNOSIS — Z7901 Long term (current) use of anticoagulants: Secondary | ICD-10-CM | POA: Insufficient documentation

## 2016-02-17 DIAGNOSIS — Z7982 Long term (current) use of aspirin: Secondary | ICD-10-CM | POA: Diagnosis not present

## 2016-02-17 DIAGNOSIS — E669 Obesity, unspecified: Secondary | ICD-10-CM | POA: Insufficient documentation

## 2016-02-17 DIAGNOSIS — Z823 Family history of stroke: Secondary | ICD-10-CM | POA: Diagnosis not present

## 2016-02-17 DIAGNOSIS — R0609 Other forms of dyspnea: Secondary | ICD-10-CM | POA: Diagnosis not present

## 2016-02-17 DIAGNOSIS — Z6830 Body mass index (BMI) 30.0-30.9, adult: Secondary | ICD-10-CM | POA: Insufficient documentation

## 2016-02-17 DIAGNOSIS — Z79899 Other long term (current) drug therapy: Secondary | ICD-10-CM | POA: Insufficient documentation

## 2016-02-17 DIAGNOSIS — K219 Gastro-esophageal reflux disease without esophagitis: Secondary | ICD-10-CM | POA: Diagnosis not present

## 2016-02-17 DIAGNOSIS — I10 Essential (primary) hypertension: Secondary | ICD-10-CM | POA: Diagnosis not present

## 2016-02-17 DIAGNOSIS — Z8249 Family history of ischemic heart disease and other diseases of the circulatory system: Secondary | ICD-10-CM | POA: Diagnosis not present

## 2016-02-17 MED ORDER — FUROSEMIDE 20 MG PO TABS: 20.0000 mg | ORAL_TABLET | Freq: Every day | ORAL | 1 refills | Status: DC | PRN

## 2016-02-17 NOTE — Patient Instructions (Signed)
Take lasix for the next 2 to 3 days daily.  Cardioversion scheduled for Tuesday, December 5th  - [facility] at the [facility] and go to admitting at 1130AM  -Do not eat or drink anything after midnight the night prior to your procedure.  - Take all your medication with a sip of water prior to arrival.  - You will not be able to drive home after your procedure.

## 2016-02-17 NOTE — Patient Instructions (Signed)
Medication Instructions:  Your physician has recommended you make the following change in your medication:  START Lasix 20mg  daily as needed for edema. An Rx has been sent to your pharmacy    Labwork: None ordered  Testing/Procedures: None ordered  Follow-Up: Your have an appt scheduled today @ 11:30am at our [facility] clinic  [facility]. Turn left onto to [facility] [address] Then left on [address] the building is the 3rd entrance Park under the building, the garage code is [id number]. Take the elevator to the 1st floor Their phone number is [phone]  Any Other Special Instructions Will Be Listed Below (If Applicable).     If you need a refill on your cardiac medications before your next appointment, please call your pharmacy. .[name]

## 2016-02-17 NOTE — Progress Notes (Signed)
Primary Care Physician: Shirline Freesory Nafziger, NP Referring Physician: Dr. Burna Fortsrenshaw   Luis Yu is a 70 y.o. male with a h/o CAD,s/p CABG, severe sleep apnea, atrial flutter (on eliquis), poorly controlled T2DM on insulin, peripheral neuropathy, HTN, HLD who presents to the afib clinic for evaluation urgently at the request of Luis Demarkhonda Barrett, NP, who pt saw earlier today.  CAD history is of the following...cath of 2011 revealed severe stenosis in the proximal and distal body of the saphenous vein graft to the left-sided PDA. There was normal left ventricular systolic function. The patient had PCI with a drug-eluting stent at that time. He had repeat cardiac cath in Dec of 2011 for dyspnea and f/u abnormal myoview. This revealed an EF of 50%, continued patency ofinternal mammary to the LAD, occluded saphenous vein graft  to the distal circumflex/PDA territory and progressive disease in the saphenous vein graft to the OM. The patient was evaluated by CVTS. Distal vessels werefelt to be poor targets. It was decided that CABG could be considered in the future if PCI options were exhausted. The patient had PCI of the saphenous vein graft to the obtuse marginal. He was last seen by Luis Yu in late July after [facility]  He was told that he had a heart attack but was managed medically.  He was in urgent care after this for SOB. EKG showed new atrial flutter. EKG on last appt demonstrated atrial flutter.  A monitor that  was placed showed persistent  atrial flutter.  At that appt the Plavix was stopped and Eliquis was started.  A stress test was recommended. The patient had no ischemia.   He was set up for cardioversion 8/24 and was seen back in the afib clinic 8/31 and was found to be in SR.    He has had onging progressive shortness of breath and cough has seen many providers since cardioversion. He is thought to have central sleep apnea and just had a repeat sleep  study but did not sleep well enough to obtain study. Only one EKG was done in pulmonary over the last few months and it was read as SR but definite flutter waves were present. HR has been reported in the 60-70's on office visits. CT of chest yesterday showed small left and right pleural effusions with BNP of 247. Does not appear fluid overloaded but definitely has exertional dyspnea with walking a very short distance (10-15 feet) Echo yesterday reveals normal EF.  Today, he denies symptoms of  progressive exertional dyspnea associated with dizziness. No orthopnea, PND, lower extremity edema,   presyncope, syncope, or neurologic sequela. The patient is tolerating medications without difficulties and is otherwise without complaint today.   Past Medical History:  Diagnosis Date  . Adjustment disorder with mixed emotional features   . Atrial flutter (HCC)   . CAD (coronary artery disease)   . Depression   . Diabetes mellitus type II, uncontrolled (HCC)   . Diabetic peripheral neuropathy (HCC)   . Erectile dysfunction   . GERD (gastroesophageal reflux disease)   . Herpes zoster   . Hyperlipidemia   . Hypertension   . Obesity   . Osteoarthritis   . Postherpetic neuralgia   . Sleep apnea    Past Surgical History:  Procedure Laterality Date  . CARDIOVERSION N/A 11/10/2015   Procedure: CARDIOVERSION;  Surgeon: Laurey Moralealton S McLean, MD;  [facility] END[facility]COPY;  Service: Cardiovascular;  Laterality: N/A;  . CORONARY ARTERY  BYPASS GRAFT  1996   eith a LIMA to the LAD, saphenous vein graft to the acute marginal, saphenous vein graft to the PDA and saphenous vein graft to the circumflex.  [name]. KNEE ARTHROSCOPY  05/2004   right knee  . REPLACEMENT TOTAL KNEE BILATERAL    . SHOULDER SURGERY      Current Outpatient Prescriptions  Medication Sig Dispense Refill  . albuterol (VENTOLIN HFA) 108 (90 Base) MCG/ACT inhaler Inhale 2 puffs into the lungs every 6 (six) hours as needed for wheezing or shortness of  breath. 1 Inhaler 2  . apixaban (ELIQUIS) 5 MG TABS tablet Take 1 tablet (5 mg total) by mouth 2 (two) times daily. 60 tablet 11  . aspirin EC 81 MG tablet Take 1 tablet (81 mg total) by mouth daily.    [name]. atorvastatin (LIPITOR) 80 MG tablet Take 1 tablet (80 mg total) by mouth daily. 90 tablet 1  . buPROPion (WELLBUTRIN SR) 150 MG 12 hr tablet Take 150 mg by mouth 2 (two) times daily.     . Cyanocobalamin (B-12) 5000 MCG SUBL Place 1 tablet under the tongue once a week.    . fluticasone (FLONASE) 50 MCG/ACT nasal spray Place 2 sprays into both nostrils daily as needed for allergies.     . furosemide (LASIX) 20 MG tablet Take 1 tablet (20 mg total) by mouth daily as needed for edema. 30 tablet 1  . gabapentin (NEURONTIN) 300 MG capsule Take 600 mg by mouth 2 (two) times daily.    [name]. glipiZIDE (GLUCOTROL) 10 MG tablet Take 1 tablet (10 mg total) by mouth 2 (two) times daily before a meal. 180 tablet 3  . glucose blood (ACCU-CHEK ACTIVE STRIPS) test strip Use as instructed 100 each 12  . Insulin Pen Needle (PEN NEEDLES 31GX5/16") 31G X 8 MM MISC Use to inject insulin 5 times a day. 100 each 11  . isosorbide mononitrate (IMDUR) 30 MG 24 hr tablet Take 1 tablet (30 mg total) by mouth daily. 30 tablet 12  . Lancets (ACCU-CHEK MULTICLIX) lancets Use up to 5 times a day to check blood sugar. 100 each 5  . lisinopril-hydrochlorothiazide (PRINZIDE,ZESTORETIC) 20-12.5 MG tablet TAKE 1 TABLET BY MOUTH EVERY DAY 90 tablet 1  . metFORMIN (GLUCOPHAGE) 1000 MG tablet Take 1 tablet (1,000 mg total) by mouth 2 (two) times daily with a meal. 180 tablet 3  . Nebivolol HCl 20 MG TABS Take 1 tablet (20 mg total) by mouth daily. 90 tablet 1  . NOVOLOG FLEXPEN 100 UNIT/ML FlexPen USE 15 TO 30 UNITS 15 MINUTES BEFORE MEALS 3 TIMES DAILY 15 pen 0  . omeprazole (PRILOSEC) 20 MG capsule TAKE ONE CAPSULE TWICE A DAY BEFORE A MEAL 180 capsule 0  . TOUJEO SOLOSTAR 300 UNIT/ML SOPN INJECT 30 UNITS INTO THE SKIN AT BEDTIME. 4.5 mL  1   Current Facility-Administered Medications  Medication Dose Route Frequency Provider Last Rate Last Dose  . ipratropium-albuterol (DUONEB) 0.5-2.5 (3) MG/3ML nebulizer solution 3 mL  3 mL Nebulization Q6H [name] Nafziger, NP      . pneumococcal 13-valent conjugate vaccine (PREVNAR 13) injection 0.5 mL  0.5 mL Intramuscular Tomorrow-1000 Shirline Freesory Nafziger, NP        No Known Allergies  Social History   Social History  . Marital status: Married    Spouse name: [name]  . Number of children: 1  . Years of education: 5613   Occupational History  . Retired Naval architecttruck driver Retired   Social History  Main Topics  . Smoking status: Never Smoker  . Smokeless tobacco: Never Used  . Alcohol use 1.2 oz/week    2 Standard drinks or equivalent per week     Comment: twice a month.  . Drug use: No  . Sexual activity: Yes    Partners: Female   Other Topics Concern  . Not on file   Social History Narrative   Married [name])   Regular exercise: none   Caffeine use: cup of coffee daily    Retired Naval architect   Caffeine- twice daily             Family History  Problem Relation Age of Onset  . Coronary artery disease Mother     deceased at 20  . Coronary artery disease Father     died age 72  . Stroke      half brother  . Stroke Brother     ROS- All systems are reviewed and negative except as per the HPI above  Physical Exam: Vitals:   02/17/16 1130  BP: (!) 164/98  Pulse: (!) 59  Weight: 240 lb 6.4 oz (109 kg)  Height: 6\' 2"  (1.88 m)   Wt Readings from Last 3 Encounters:  02/17/16 240 lb 6.4 oz (109 kg)  02/17/16 239 lb (108.4 kg)  02/15/16 239 lb (108.4 kg)    Labs: Lab Results  Component Value Date   NA 139 02/15/2016   K 4.2 02/15/2016   CL 102 02/15/2016   CO2 30 02/15/2016   GLUCOSE 149 (H) 02/15/2016   BUN 23 02/15/2016   CREATININE 1.15 02/15/2016   CALCIUM 8.9 02/15/2016   Lab Results  Component Value Date   INR 1.18 12/07/2015   Lab Results    Component Value Date   CHOL 142 05/13/2015   HDL 54 05/13/2015   LDLCALC 73 05/13/2015   TRIG 77 05/13/2015  Pulse Ox 95%   GEN- The patient is well appearing, alert and oriented x 3 today.   Head- normocephalic, atraumatic Eyes-  Sclera clear, conjunctiva pink Ears- hearing intact Oropharynx- clear Neck- supple, no JVP Lymph- no cervical lymphadenopathy Lungs- Clear to ausculation bilaterally, normal work of breathing Heart- Regular rate and rhythm, no murmurs, rubs or gallops, PMI not laterally displaced GI- soft, NT, ND, + BS Extremities- no clubbing, cyanosis, or edema MS- no significant deformity or atrophy Skin- no rash or lesion Psych- euthymic mood, full affect Neuro- strength and sensation are intact  CMP 11/29 unremarkable CBC- 11/29-remarkable for HGB at 11.0, HCT 34.3  EKG- Typical appearing atrial flutter with  4:1 atrial flutter, HR 59 bpm  CXR- 11/29-Cardiac shadow is stable. Postsurgical changes are again seen. The lungs are well aerated bilaterally. No focal infiltrate or sizable effusion is seen. No acute bony abnormality is noted.  IMPRESSION: No acute abnormality seen.  Echo- 11/30-Study Conclusions  - Left ventricle: The cavity size was mildly dilated. Wall   thickness was increased in a pattern of mild LVH. Systolic   function was normal. The estimated ejection fraction was in the   range of 60% to 65%. The study is not technically sufficient to   allow evaluation of LV diastolic function. - Aortic valve: AV is thickened, calcified with minimally   restricted motion. - Mitral valve: Moderately calcified annulus. Mildly thickened   leaflets . There was mild regurgitation. - Left atrium: The atrium was moderately dilated. - Right atrium: The atrium was moderately dilated.  CT of chest-11/30- IMPRESSION: No acute  pulmonary embolus. Trace left and small right pleural effusions.  Calcified granulomata in the right upper and right middle  lobes.  Noncalcified 3 mm right middle lobe nodular density noted previously appears to be related to a tortuous vessel as are previously noted densities in the right upper lobe.  Holter monitor- 8/22-Persistent atrial flutter with variable rate. Premature ectopic complexes.  Of note there is conduction with aberrancy when his rate is faster.   Assessment and Plan: 1.Typical aflutter It is hard to say if all of pt's exertional dyspnea is from atrial flutter but will arrange for cardioversion to see if SR can be restored and if pt's symptoms dramatically improve First available is 12/6. If dyspnea worsens go to ER NO missed doses of Eliquis I discussed with Dr. Johney Frame and he agrees EKG appears to be  typical aflutter. Will arrange consult with Dr. Johney Frame  to discuss flutter ablation Pt was given lasix 20 mg by Luis Demark today and he can take for the next few days to see if dyspnea improves Continue  with f/u with pulmonology for  possible central sleep apnea Continue with cpap Pt to be very nonexertional until cardioversion May need to have another w/u for ischemia with h/o  complex CAD if symptoms are not dramatically improved with return of SR May benefit from stopping ace to see if cough improves  Lupita Leash C. [facility] [address] [place], [place] [address] [phone]

## 2016-02-17 NOTE — Progress Notes (Signed)
Cardiology Office Note   Date:  02/17/2016   ID:  Yu, Luis 11-14-1945, MRN [id number]  PCP:  Shirline Frees, NP  Cardiologist: Dr Jens Som 11/2012  Theodore Demark, PA-C   Chief Complaint  Patient presents with  . Follow-up    AFIB    History of Present Illness: Luis Yu is a 70 y.o. male with a history of DM, OSA on CPAP, HTN, CABG 1996, MI & cath 07/2009 w/ DES SVG-PDA, SVG-PDA 100% & PCI SVG-OM at cath 02/2010, depression, GERD, OA, atrial fib/flutter on Eliquis s/p DCCV 10/2015  11/29, seen by S. Groce, NP and ?back in atrial fib>>appt made  Luis Yu presents for evaluation of possible atrial fib.  He developed a cough in September.He feels the cough is preventing him from sleeping.   He has followed up with primary care, notes not available. He saw Dr Vassie Loll (for sleep issues). A recent sleep study was very concerning for central apnea. F/u sleep study planned.   Pt seen by S. Groce, NP 11/29 for respiratory issues. He was given rx for an inhaler (not filled yet) but nothing for cough. His SOB was concerning, but he was not able to complete a walk test due to leg weakness. His sats never got below 90%. CXR/CT results below, no acute process.   Echo 11/30, results below. He has elevated CVP, no number given. Pt has mild LE edema, no recent changes.   He has not had chest pain. He has episodic SOB, almost like Cheyne-Stokes respirations. He and his wife agree that the DOE has gotten worse recently. The time frame is unclear.  He has no awareness of the atrial fibrillation, he never has palpitations or is aware of his heart rate. He has not been light-headed or dizzy. He has been weak.   Past Medical History:  Diagnosis Date  . Adjustment disorder with mixed emotional features   . Atrial flutter (HCC)   . CAD (coronary artery disease)   . Depression   . Diabetes mellitus type II, uncontrolled (HCC)   . Diabetic peripheral neuropathy (HCC)   . Erectile  dysfunction   . GERD (gastroesophageal reflux disease)   . Herpes zoster   . Hyperlipidemia   . Hypertension   . Obesity   . Osteoarthritis   . Postherpetic neuralgia   . Sleep apnea     Past Surgical History:  Procedure Laterality Date  . CARDIOVERSION N/A 11/10/2015   Procedure: CARDIOVERSION;  Surgeon: Laurey Morale, MD;  Location: [facility] ENDOSCOPY;  Service: Cardiovascular;  Laterality: N/A;  . CORONARY ARTERY BYPASS GRAFT  1996   eith a LIMA to the LAD, saphenous vein graft to the acute marginal, saphenous vein graft to the PDA and saphenous vein graft to the circumflex.  [name] KNEE ARTHROSCOPY  05/2004   right knee  . REPLACEMENT TOTAL KNEE BILATERAL    . SHOULDER SURGERY      Medication Sig  . albuterol (VENTOLIN HFA) 108 (90 Base) MCG/ACT inhaler Inhale 2 puffs into the lungs every 6 (six) hours as needed for wheezing or shortness of breath.  [name] apixaban (ELIQUIS) 5 MG TABS tablet Take 1 tablet (5 mg total) by mouth 2 (two) times daily.  [name] aspirin EC 81 MG tablet Take 1 tablet (81 mg total) by mouth daily.  [name] atorvastatin (LIPITOR) 80 MG tablet Take 1 tablet (80 mg total) by mouth daily.  [name] buPROPion (WELLBUTRIN SR) 150 MG 12 hr tablet Take  150 mg by mouth 2 (two) times daily.   . Cyanocobalamin (B-12) 5000 MCG SUBL Place 1 tablet under the tongue once a week.  . fluticasone (FLONASE) 50 MCG/ACT nasal spray Place 2 sprays into both nostrils daily as needed for allergies.   [name] gabapentin (NEURONTIN) 300 MG capsule Take 600 mg by mouth 2 (two) times daily.  [name] glipiZIDE (GLUCOTROL) 10 MG tablet Take 1 tablet (10 mg total) by mouth 2 (two) times daily before a meal.  . glucose blood (ACCU-CHEK ACTIVE STRIPS) test strip Use as instructed  . Insulin Pen Needle (PEN NEEDLES 31GX5/16") 31G X 8 MM MISC Use to inject insulin 5 times a day.  . isosorbide mononitrate (IMDUR) 30 MG 24 hr tablet Take 1 tablet (30 mg total) by mouth daily.  . Lancets (ACCU-CHEK MULTICLIX) lancets Use up to 5 times  a day to check blood sugar.  . lisinopril-hydrochlorothiazide (PRINZIDE,ZESTORETIC) 20-12.5 MG tablet TAKE 1 TABLET BY MOUTH EVERY DAY  . metFORMIN (GLUCOPHAGE) 1000 MG tablet Take 1 tablet (1,000 mg total) by mouth 2 (two) times daily with a meal.  . Nebivolol HCl 20 MG TABS Take 1 tablet (20 mg total) by mouth daily.  [name] NOVOLOG FLEXPEN 100 UNIT/ML FlexPen USE 15 TO 30 UNITS 15 MINUTES BEFORE MEALS 3 TIMES DAILY  . omeprazole (PRILOSEC) 20 MG capsule TAKE ONE CAPSULE TWICE A DAY BEFORE A MEAL  . TOUJEO SOLOSTAR 300 UNIT/ML SOPN INJECT 30 UNITS INTO THE SKIN AT BEDTIME.   Medication Dose Route Frequency Provider  . ipratropium-albuterol (DUONEB) 0.5-2.5 (3) MG/3ML nebulizer solution 3 mL  3 mL Nebulization Q6H [name] Nafziger, NP  . pneumococcal 13-valent conjugate vaccine (PREVNAR 13) injection 0.5 mL  0.5 mL Intramuscular Tomorrow-1000 Shirline Frees, NP    Allergies:   Patient has no known allergies.    Social History:  The patient  reports that he has never smoked. He has never used smokeless tobacco. He reports that he drinks about 1.2 oz of alcohol per week . He reports that he does not use drugs.   Family History:  The patient's family history includes Coronary artery disease in his father and mother; Stroke in his brother.    ROS:  Please see the history of present illness. All other systems are reviewed and negative.    PHYSICAL EXAM: VS:  BP 140/66   Pulse (!) 59   Ht 6\' 2"  (1.88 m)   Wt 239 lb (108.4 kg)   BMI 30.69 kg/m  , BMI Body mass index is 30.69 kg/m. GEN: Well nourished, well developed, male in no acute distress  HEENT: normal for age  Neck: JVD minimal elevation, no carotid bruit, no masses Cardiac: RRR; soft murmur, no rubs, or gallops Respiratory: decreased BS bases bilaterally, normal work of breathing GI: soft, nontender, nondistended, + BS MS: no deformity or atrophy; trace-1+ edema; distal pulses are 2+ in all 4 extremities   Skin: warm and dry, no  rash Neuro:  Strength and sensation are intact Psych: euthymic mood, full affect   EKG:  EKG is ordered today. The ekg ordered today demonstrates atrial flutter, 4:1 conduction w/ HR 59 Lateral T wave inversions have been seen on some, not all of previous ECGs.  10/20/2015  Nuclear stress EF: 48%.  The left ventricular ejection fraction is mildly decreased (45-54%).  There was no ST segment deviation noted during stress.  This is a low risk study.  Normal resting and stress perfusion. No ischemia or infarction EF 48%  but may not be accurate due to afib   ECHO: 02/16/2016 - Left ventricle: The cavity size was mildly dilated. Wall   thickness was increased in a pattern of mild LVH. Systolic   function was normal. The estimated ejection fraction was in the   range of 60% to 65%. The study is not technically sufficient to   allow evaluation of LV diastolic function. - Aortic valve: AV is thickened, calcified with minimally   restricted motion. - Mitral valve: Moderately calcified annulus. Mildly thickened   leaflets . There was mild regurgitation. - Left atrium: The atrium was moderately dilated. 54 mm - Right atrium: The atrium was moderately dilated. - Inferior vena cava: The vessel was dilated. The respirophasic diameter changes were blunted (< 50%), consistent with elevated central venous pressure  Dg Chest 2 View Result Date: 02/15/2016 CLINICAL DATA:  Dyspnea on exertion and cough for several months, initial encounter EXAM: CHEST  2 VIEW COMPARISON:  12/07/2015 FINDINGS: Cardiac shadow is stable. Postsurgical changes are again seen. The lungs are well aerated bilaterally. No focal infiltrate or sizable effusion is seen. No acute bony abnormality is noted. IMPRESSION: No acute abnormality seen. Electronically Signed   By: Alcide CleverMark  Lukens M.D.   On: 02/15/2016 16:06   Ct Angio Chest W/cm &/or Wo Cm Result Date: 02/16/2016 CLINICAL DATA:  Increased dyspnea and wheezing for  the past 3 months. EXAM: CT ANGIOGRAPHY CHEST WITH CONTRAST TECHNIQUE: Multidetector CT imaging of the chest was performed using the standard protocol during bolus administration of intravenous contrast. Multiplanar CT image reconstructions and MIPs were obtained to evaluate the vascular anatomy. CONTRAST:  80 cc Isovue 370 IV COMPARISON:  CXR from the same day, chest CT 12/07/2015 FINDINGS: Cardiovascular: The study is of quality for the evaluation of pulmonary embolism. There are no filling defects in the central, lobar, segmental or subsegmental pulmonary artery branches to suggest acute pulmonary embolism. Great vessels are normal in course and caliber. Prior CABG. Three-vessel coronary arteriosclerosis. Normal heart size. Thoracic atherosclerosis. No significant pericardial fluid/thickening. Mediastinum/Nodes: No discrete thyroid nodules. Unremarkable esophagus. No pathologically enlarged axillary, mediastinal or hilar lymph nodes. Lungs/Pleura: No pneumothorax. Small right and trace left pleural effusions are new since previous exam. No pneumonic consolidations. 3 mm calcified nodule along the right minor fissure is again noted as well as a smaller calcified nodule on image 176 in the right middle lobe. The The noncalcified 3 mm nodule right upper lobe densities seen previously appears to be related to the pulmonary vasculature, series 3, image 35 and 37 previously versus series 5, image 61 and 67 currently. The previously noted right middle lobe 3 mm nodular density appears to be related with a vessel as well series 5, image 145. Upper abdomen: Unremarkable. Musculoskeletal: No aggressive appearing focal osseous lesions. Nondisplaced right lateral tenth rib fracture noted previously is not apparent on current exam but is not entirely included on current study. Review of the MIP images confirms the above findings. IMPRESSION: No acute pulmonary embolus. Trace left and small right pleural effusions. Calcified  granulomata in the right upper and right middle lobes. Noncalcified 3 mm right middle lobe nodular density noted previously appears to be related to a tortuous vessel as are previously noted densities in the right upper lobe. Electronically Signed   By: Tollie Ethavid  Kwon M.D.   On: 02/16/2016 13:50    Recent Labs: 10/14/2015: TSH 1.15 11/01/2015: Brain Natriuretic Peptide 195.2 02/15/2016: ALT 17; BUN 23; Creatinine, Ser 1.15; Hemoglobin 11.0; Platelets 151.0;  Potassium 4.2; Pro B Natriuretic peptide (BNP) 247.0; Sodium 139    Lipid Panel    Component Value Date/Time   CHOL 142 05/13/2015 1708   TRIG 77 05/13/2015 1708   HDL 54 05/13/2015 1708   CHOLHDL 2.6 05/13/2015 1708   VLDL 15 05/13/2015 1708   LDLCALC 73 05/13/2015 1708     Wt Readings from Last 3 Encounters:  02/17/16 239 lb (108.4 kg)  02/15/16 239 lb (108.4 kg)  02/13/16 237 lb (107.5 kg)     Other studies Reviewed: Additional studies/ records that were reviewed today include: office notes, hospital records and testing.  ASSESSMENT AND PLAN:  1.  Atrial flutter: HR is controlled. His SOB may be worse due to this. No change in rx, but will refer to afib clinic. They can see him today. Continue current therapy. His LA is 54 mm, he will likely need an antiarrhythmic.   2. SOB, COUGH: Pt may have some neurologic issues affecting his breathing. His breathing varied, almost with a Cheyne-Stokes pattern. He has central sleep apnea as well. He is to follow up with Dr Vassie LollAlva for the sleep apnea.  He has mild volume overload on exam. Will give him Lasix 20 mg qd prn for edema.   He is encouraged to use OTC meds like Benadryl or Robitussin DM for the cough. He may be having some sinus drainage. The cough seems worst when he lies down.   3. Chronic anticoagulation: he is not having any bleeding issues. CHA2DS2VASc=4 (age x 1, DM, HTN, CAD)  Current medicines are reviewed at length with the patient today.  The patient does not have  concerns regarding medicines.  The following changes have been made:  Prn Lasix  Labs/ tests ordered today include:  No orders of the defined types were placed in this encounter.    Disposition:   FU with Dr Jens Somrenshaw and the Afib clinic  Signed, Leanna BattlesBarrett, Saajan Willmon, PA-C  02/17/2016 9:24 AM     [facility] Phone: [phone]; Fax: [phone]  This note was written with the assistance of speech recognition software. Please excuse any transcriptional errors.

## 2016-02-21 ENCOUNTER — Ambulatory Visit (HOSPITAL_COMMUNITY): Payer: Medicare Other | Admitting: Certified Registered"

## 2016-02-21 ENCOUNTER — Other Ambulatory Visit: Payer: Self-pay

## 2016-02-21 ENCOUNTER — Ambulatory Visit (HOSPITAL_COMMUNITY)
Admission: RE | Admit: 2016-02-21 | Discharge: 2016-02-21 | Disposition: A | Discharge status: Home / Self Care | Payer: Medicare Other | Source: Ambulatory Visit | Attending: Cardiology | Admitting: Cardiology

## 2016-02-21 ENCOUNTER — Encounter (HOSPITAL_COMMUNITY): Payer: Self-pay

## 2016-02-21 ENCOUNTER — Encounter (HOSPITAL_COMMUNITY)
Admission: RE | Disposition: A | Discharge status: Home / Self Care | Payer: Self-pay | Source: Ambulatory Visit | Attending: Cardiology

## 2016-02-21 DIAGNOSIS — I2510 Atherosclerotic heart disease of native coronary artery without angina pectoris: Secondary | ICD-10-CM | POA: Insufficient documentation

## 2016-02-21 DIAGNOSIS — I4891 Unspecified atrial fibrillation: Secondary | ICD-10-CM | POA: Diagnosis not present

## 2016-02-21 DIAGNOSIS — K219 Gastro-esophageal reflux disease without esophagitis: Secondary | ICD-10-CM | POA: Insufficient documentation

## 2016-02-21 DIAGNOSIS — Z6830 Body mass index (BMI) 30.0-30.9, adult: Secondary | ICD-10-CM | POA: Insufficient documentation

## 2016-02-21 DIAGNOSIS — E785 Hyperlipidemia, unspecified: Secondary | ICD-10-CM | POA: Diagnosis not present

## 2016-02-21 DIAGNOSIS — M1990 Unspecified osteoarthritis, unspecified site: Secondary | ICD-10-CM | POA: Insufficient documentation

## 2016-02-21 DIAGNOSIS — Z794 Long term (current) use of insulin: Secondary | ICD-10-CM | POA: Insufficient documentation

## 2016-02-21 DIAGNOSIS — E1142 Type 2 diabetes mellitus with diabetic polyneuropathy: Secondary | ICD-10-CM | POA: Diagnosis not present

## 2016-02-21 DIAGNOSIS — Z7982 Long term (current) use of aspirin: Secondary | ICD-10-CM | POA: Diagnosis not present

## 2016-02-21 DIAGNOSIS — I10 Essential (primary) hypertension: Secondary | ICD-10-CM | POA: Diagnosis not present

## 2016-02-21 DIAGNOSIS — E669 Obesity, unspecified: Secondary | ICD-10-CM | POA: Insufficient documentation

## 2016-02-21 DIAGNOSIS — G4730 Sleep apnea, unspecified: Secondary | ICD-10-CM | POA: Diagnosis not present

## 2016-02-21 DIAGNOSIS — I4892 Unspecified atrial flutter: Secondary | ICD-10-CM | POA: Insufficient documentation

## 2016-02-21 HISTORY — PX: CARDIOVERSION: SHX1299

## 2016-02-21 LAB — GLUCOSE, CAPILLARY
Glucose-Capillary: 105 mg/dL — ABNORMAL HIGH (ref 65–99)
Glucose-Capillary: 126 mg/dL — ABNORMAL HIGH (ref 65–99)

## 2016-02-21 SURGERY — CARDIOVERSION
Anesthesia: General

## 2016-02-21 MED ORDER — SODIUM CHLORIDE 0.9 % IV SOLN
INTRAVENOUS | Status: DC
  Administered 2016-02-21: 500 mL via INTRAVENOUS

## 2016-02-21 MED ORDER — PROPOFOL 10 MG/ML IV BOLUS
INTRAVENOUS | Status: DC | PRN
  Administered 2016-02-21: 50 mg via INTRAVENOUS
  Administered 2016-02-21: 20 mg via INTRAVENOUS

## 2016-02-21 NOTE — H&P (Signed)
02/17/2016 11:30 AM  ATRIAL FIB OFFICE VISIT  MRN:  [id number]  Description: Male DOB: 17-Feb-1946 Provider: Newman Nip, NP Department: [facility]  Vitals   BP    164/98 (BP Location: Left Arm, Patient Position: Sitting, Cuff Size: Normal)   Pulse    59   Ht  6\' 2"  (1.88 m)   Wt  240 lb 6.4 oz (109 kg)   BMI  30.87 kg/m    Progress Notes   Newman Nip, NP at 02/17/2016 12:18 PM   Status: Signed  Expand All Collapse All     Primary Care Physician: Shirline Frees, NP Referring Physician: Dr. Burna Forts is a 70 y.o. male with a h/o CAD,s/p CABG, severe sleep apnea,atrial flutter (on eliquis), poorly controlled T2DM on insulin, peripheral neuropathy, HTN, HLD who presents to the afib clinic for evaluation urgently at the request of Theodore Demark, NP, who pt saw earlier today.  CAD history is of the following...cath of 2011 revealed severe stenosis in the proximal and distal body of the saphenous vein graft to the left-sided PDA. There was normal left ventricular systolic function. The patient had PCI with a drug-eluting stent at that time. He had repeat cardiac cath in Dec of 2011 for dyspnea and f/u abnormal myoview. This revealed an EF of 50%, continued patency ofinternal mammary to the LAD, occluded saphenous vein graft to the distal circumflex/PDA territory and progressive disease in the saphenous vein graft to the OM. The patient was evaluated by CVTS. Distal vessels werefelt to be poor targets. It was decided thatCABG could be considered in the future if PCI options were exhausted. The patient had PCI of the saphenous vein graft to the obtuse marginal. He was last seen by Dene Gentry in late July after being admitted to [name] in May. He was told that he had a heart attack but was managed medically. He was in urgent care after this for SOB. EKG showed new atrial flutter.EKG on last appt demonstrated atrial flutter.  A monitor that  was placed showed persistent  atrial flutter. At that appt the Plavix was stopped and Eliquis was started. A stress test was recommended. The patient had no ischemia.  He was set up for cardioversion 8/24 and was seen back in the afib clinic 8/31 and was found to be in SR.    He has had onging progressive shortness of breath and cough has seen many providers since cardioversion. He is thought to have central sleep apnea and just had a repeat sleep study but did not sleep well enough to obtain study. Only one EKG was done in pulmonary over the last few months and it was read as SR but definite flutter waves were present. HR has been reported in the 60-70's on office visits. CT of chest yesterday showed small left and right pleural effusions with BNP of 247. Does not appear fluid overloaded but definitely has exertional dyspnea with walking a very short distance (10-15 feet) Echo yesterday reveals normal EF.  Today, he denies symptoms of  progressive exertional dyspnea associated with dizziness. No orthopnea, PND, lower extremity edema,   presyncope, syncope, or neurologic sequela. The patient is tolerating medications without difficulties and is otherwise without complaint today.       Past Medical History:  Diagnosis Date  . Adjustment disorder with mixed emotional features   . Atrial flutter (HCC)   . CAD (coronary artery disease)   . Depression   .  Diabetes mellitus type II, uncontrolled (HCC)   . Diabetic peripheral neuropathy (HCC)   . Erectile dysfunction   . GERD (gastroesophageal reflux disease)   . Herpes zoster   . Hyperlipidemia   . Hypertension   . Obesity   . Osteoarthritis   . Postherpetic neuralgia   . Sleep apnea         Past Surgical History:  Procedure Laterality Date  . CARDIOVERSION N/A 11/10/2015   Procedure: CARDIOVERSION;  Surgeon: Laurey Moralealton S McLean, MD;  [facility];  Service: Cardiovascular;  Laterality: N/A;  . CORONARY ARTERY  BYPASS GRAFT  1996   eith a LIMA to the LAD, saphenous vein graft to the acute marginal, saphenous vein graft to the PDA and saphenous vein graft to the circumflex.  [name]. KNEE ARTHROSCOPY  05/2004   right knee  . REPLACEMENT TOTAL KNEE BILATERAL    . SHOULDER SURGERY            Current Outpatient Prescriptions  Medication Sig Dispense Refill  . albuterol (VENTOLIN HFA) 108 (90 Base) MCG/ACT inhaler Inhale 2 puffs into the lungs every 6 (six) hours as needed for wheezing or shortness of breath. 1 Inhaler 2  . apixaban (ELIQUIS) 5 MG TABS tablet Take 1 tablet (5 mg total) by mouth 2 (two) times daily. 60 tablet 11  . aspirin EC 81 MG tablet Take 1 tablet (81 mg total) by mouth daily.    [name]. atorvastatin (LIPITOR) 80 MG tablet Take 1 tablet (80 mg total) by mouth daily. 90 tablet 1  . buPROPion (WELLBUTRIN SR) 150 MG 12 hr tablet Take 150 mg by mouth 2 (two) times daily.     . Cyanocobalamin (B-12) 5000 MCG SUBL Place 1 tablet under the tongue once a week.    . fluticasone (FLONASE) 50 MCG/ACT nasal spray Place 2 sprays into both nostrils daily as needed for allergies.     . furosemide (LASIX) 20 MG tablet Take 1 tablet (20 mg total) by mouth daily as needed for edema. 30 tablet 1  . gabapentin (NEURONTIN) 300 MG capsule Take 600 mg by mouth 2 (two) times daily.    [name]. glipiZIDE (GLUCOTROL) 10 MG tablet Take 1 tablet (10 mg total) by mouth 2 (two) times daily before a meal. 180 tablet 3  . glucose blood (ACCU-CHEK ACTIVE STRIPS) test strip Use as instructed 100 each 12  . Insulin Pen Needle (PEN NEEDLES 31GX5/16") 31G X 8 MM MISC Use to inject insulin 5 times a day. 100 each 11  . isosorbide mononitrate (IMDUR) 30 MG 24 hr tablet Take 1 tablet (30 mg total) by mouth daily. 30 tablet 12  . Lancets (ACCU-CHEK MULTICLIX) lancets Use up to 5 times a day to check blood sugar. 100 each 5  . lisinopril-hydrochlorothiazide (PRINZIDE,ZESTORETIC) 20-12.5 MG tablet TAKE 1 TABLET BY MOUTH EVERY  DAY 90 tablet 1  . metFORMIN (GLUCOPHAGE) 1000 MG tablet Take 1 tablet (1,000 mg total) by mouth 2 (two) times daily with a meal. 180 tablet 3  . Nebivolol HCl 20 MG TABS Take 1 tablet (20 mg total) by mouth daily. 90 tablet 1  . NOVOLOG FLEXPEN 100 UNIT/ML FlexPen USE 15 TO 30 UNITS 15 MINUTES BEFORE MEALS 3 TIMES DAILY 15 pen 0  . omeprazole (PRILOSEC) 20 MG capsule TAKE ONE CAPSULE TWICE A DAY BEFORE A MEAL 180 capsule 0  . TOUJEO SOLOSTAR 300 UNIT/ML SOPN INJECT 30 UNITS INTO THE SKIN AT BEDTIME. 4.5 mL 1  Current Facility-Administered Medications  Medication Dose Route Frequency Provider Last Rate Last Dose  . ipratropium-albuterol (DUONEB) 0.5-2.5 (3) MG/3ML nebulizer solution 3 mL  3 mL Nebulization Q6H [name] Nafziger, NP      . pneumococcal 13-valent conjugate vaccine (PREVNAR 13) injection 0.5 mL  0.5 mL Intramuscular Tomorrow-1000 Shirline Frees, NP        No Known Allergies  Social History        Social History  . Marital status: Married    Spouse name: [name]  . Number of children: 1  . Years of education: 68       Occupational History  . Retired Naval architect Retired         Social History Main Topics  . Smoking status: Never Smoker  . Smokeless tobacco: Never Used  . Alcohol use 1.2 oz/week    2 Standard drinks or equivalent per week     Comment: twice a month.  . Drug use: No  . Sexual activity: Yes    Partners: Female       Other Topics Concern  . Not on file      Social History Narrative   Married [name])   Regular exercise: none   Caffeine use: cup of coffee daily    Retired Naval architect   Caffeine- twice daily                   Family History  Problem Relation Age of Onset  . Coronary artery disease Mother     deceased at 21  . Coronary artery disease Father     died age 8  . Stroke      half brother  . Stroke Brother     ROS- All systems are reviewed and negative except as per  the HPI above  Physical Exam:    Vitals:   02/17/16 1130  BP: (!) 164/98  Pulse: (!) 59  Weight: 240 lb 6.4 oz (109 kg)  Height: 6\' 2"  (1.88 m)      Wt Readings from Last 3 Encounters:  02/17/16 240 lb 6.4 oz (109 kg)  02/17/16 239 lb (108.4 kg)  02/15/16 239 lb (108.4 kg)    Labs: Recent Labs       Lab Results  Component Value Date   NA 139 02/15/2016   K 4.2 02/15/2016   CL 102 02/15/2016   CO2 30 02/15/2016   GLUCOSE 149 (H) 02/15/2016   BUN 23 02/15/2016   CREATININE 1.15 02/15/2016   CALCIUM 8.9 02/15/2016     Recent Labs       Lab Results  Component Value Date   INR 1.18 12/07/2015     Recent Labs       Lab Results  Component Value Date   CHOL 142 05/13/2015   HDL 54 05/13/2015   LDLCALC 73 05/13/2015   TRIG 77 05/13/2015    Pulse Ox 95%   GEN- The patient is well appearing, alert and oriented x 3 today.   Head- normocephalic, atraumatic Eyes-  Sclera clear, conjunctiva pink Ears- hearing intact Oropharynx- clear Neck- supple, no JVP Lymph- no cervical lymphadenopathy Lungs- Clear to ausculation bilaterally, normal work of breathing Heart- Regular rate and rhythm, no murmurs, rubs or gallops, PMI not laterally displaced GI- soft, NT, ND, + BS Extremities- no clubbing, cyanosis, or edema MS- no significant deformity or atrophy Skin- no rash or lesion Psych- euthymic mood, full affect Neuro- strength and sensation are intact  CMP 11/29 unremarkable  CBC- 11/29-remarkable for HGB at 11.0, HCT 34.3  EKG- Typical appearing atrial flutter with  4:1 atrial flutter, HR 59 bpm  CXR- 11/29-Cardiac shadow is stable. Postsurgical changes are again seen. The lungs are well aerated bilaterally. No focal infiltrate or sizable effusion is seen. No acute bony abnormality is noted.  IMPRESSION: No acute abnormality seen.  Echo- 11/30-Study Conclusions  - Left ventricle: The cavity size was mildly dilated.  Wall thickness was increased in a pattern of mild LVH. Systolic function was normal. The estimated ejection fraction was in the range of 60% to 65%. The study is not technically sufficient to allow evaluation of LV diastolic function. - Aortic valve: AV is thickened, calcified with minimally restricted motion. - Mitral valve: Moderately calcified annulus. Mildly thickened leaflets . There was mild regurgitation. - Left atrium: The atrium was moderately dilated. - Right atrium: The atrium was moderately dilated.  CT of chest-11/30- IMPRESSION: No acute pulmonary embolus. Trace left and small right pleural effusions.  Calcified granulomata in the right upper and right middle lobes.  Noncalcified 3 mm right middle lobe nodular density noted previously appears to be related to a tortuous vessel as are previously noted densities in the right upper lobe.  Holter monitor- 8/22-Persistent atrial flutter with variable rate. Premature ectopic complexes.  Of note there is conduction with aberrancy when his rate is faster.   Assessment and Plan: 1.Typical aflutter It is hard to say if all of pt's exertional dyspnea is from atrial flutter but will arrange for cardioversion to see if SR can be restored and if pt's symptoms dramatically improve First available is 12/6. If dyspnea worsens go to ER NO missed doses of Eliquis I discussed with Dr. Johney FrameAllred and he agrees EKG appears to be  typical aflutter. Will arrange consult with Dr. Johney FrameAllred  to discuss flutter ablation Pt was given lasix 20 mg by [name] today and he can take for the next few days to see if dyspnea improves Continue  with f/u with pulmonology for  possible central sleep apnea Continue with cpap Pt to be very nonexertional until cardioversion May need to have another w/u for ischemia with h/o  complex CAD if symptoms are not dramatically improved with return of SR May benefit from stopping ace to see if  cough improves  Lupita LeashDonna C. Matthew Folksarroll, ANP-C [facility] [address] [place], [place] [id number] [phone]        For DCCV; no changes Olga MillersBrian Crenshaw

## 2016-02-21 NOTE — Discharge Instructions (Signed)
Electrical Cardioversion, Care After °This sheet gives you information about how to care for yourself after your procedure. Your health care provider may also give you more specific instructions. If you have problems or questions, contact your health care provider. °What can I expect after the procedure? °After the procedure, it is common to have: °· Some redness on the skin where the shocks were given. °Follow these instructions at home: °· Do not drive for 24 hours if you were given a medicine to help you relax (sedative). °· Take over-the-counter and prescription medicines only as told by your health care provider. °· Ask your health care provider how to check your pulse. Check it often. °· Rest for 48 hours after the procedure or as told by your health care provider. °· Avoid or limit your caffeine use as told by your health care provider. °Contact a health care provider if: °· You feel like your heart is beating too quickly or your pulse is not regular. °· You have a serious muscle cramp that does not go away. °Get help right away if: °· You have discomfort in your chest. °· You are dizzy or you feel faint. °· You have trouble breathing or you are short of breath. °· Your speech is slurred. °· You have trouble moving an arm or leg on one side of your body. °· Your fingers or toes turn cold or blue. °This information is not intended to replace advice given to you by your health care provider. Make sure you discuss any questions you have with your health care provider. °Document Released: 12/24/2012 Document Revised: 10/07/2015 Document Reviewed: 09/09/2015 °Elsevier Interactive Patient Education © 2017 Elsevier Inc. ° °

## 2016-02-21 NOTE — Procedures (Signed)
Electrical Cardioversion Procedure Note Doretha ImusDonald W Belling [id number] February 10, 1946  Procedure: Electrical Cardioversion Indications:  Atrial Flutter  Procedure Details Consent: Risks of procedure as well as the alternatives and risks of each were explained to the (patient/caregiver).  Consent for procedure obtained. Time Out: Verified patient identification, verified procedure, site/side was marked, verified correct patient position, special equipment/implants available, medications/allergies/relevent history reviewed, required imaging and test results available.  Performed  Patient placed on cardiac monitor, pulse oximetry, supplemental oxygen as necessary.  Sedation given: Pt sedated by anesthesia with diprovan 70 mg IV. Pacer pads placed anterior and posterior chest.  Cardioverted 1 time(s).  Cardioverted at 120J.  Evaluation Findings: Post procedure EKG shows: NSR Complications: None Patient did tolerate procedure well.   Olga MillersBrian Rosemae Mcquown 02/21/2016, 12:25 PM

## 2016-02-21 NOTE — Transfer of Care (Signed)
Immediate Anesthesia Transfer of Care Note  Patient: Luis Yu  Procedure(s) Performed: Procedure(s): CARDIOVERSION (N/A)  Patient Location: Endoscopy Unit  Anesthesia Type:General  Level of Consciousness: awake, alert , oriented and patient cooperative  Airway & Oxygen Therapy: Patient Spontanous Breathing and Patient connected to nasal cannula oxygen  Post-op Assessment: Report given to RN, Post -op Vital signs reviewed and stable and Patient moving all extremities  Post vital signs: Reviewed and stable  Last Vitals:  Vitals:   02/21/16 1151  BP: (!) 149/84  Pulse: 61  Resp: (!) 21  Temp: 36.6 C    Last Pain:  Vitals:   02/21/16 1151  TempSrc: Oral         Complications: No apparent anesthesia complications

## 2016-02-22 ENCOUNTER — Encounter (HOSPITAL_COMMUNITY): Payer: Self-pay | Admitting: Cardiology

## 2016-02-22 NOTE — Anesthesia Preprocedure Evaluation (Signed)
Anesthesia Evaluation  Patient identified by MRN, date of birth, ID band Patient awake    Reviewed: Allergy & Precautions, NPO status , Patient's Chart, lab work & pertinent test results  History of Anesthesia Complications Negative for: history of anesthetic complications  Airway Mallampati: I  TM Distance: >3 FB Neck ROM: Full    Dental  (+) Edentulous Upper, Partial Lower, Dental Advisory Given   Pulmonary sleep apnea ,    breath sounds clear to auscultation       Cardiovascular hypertension, Pt. on medications + CAD and + CABG  + dysrhythmias Atrial Fibrillation  Rhythm:Irregular Rate:Normal  8/17 ECHO:  EF 55-60%, valves OK   Neuro/Psych Depression negative neurological ROS     GI/Hepatic Neg liver ROS, GERD  Medicated and Controlled,  Endo/Other  diabetes, Insulin Dependent, Oral Hypoglycemic AgentsMorbid obesity  Renal/GU negative Renal ROS     Musculoskeletal   Abdominal (+) + obese,   Peds  Hematology  (+) Blood dyscrasia (eliquis), ,   Anesthesia Other Findings   Reproductive/Obstetrics                             Anesthesia Physical Anesthesia Plan  ASA: III  Anesthesia Plan: General   Post-op Pain Management:    Induction: Intravenous  Airway Management Planned: Mask  Additional Equipment: None  Intra-op Plan:   Post-operative Plan:   Informed Consent: I have reviewed the patients History and Physical, chart, labs and discussed the procedure including the risks, benefits and alternatives for the proposed anesthesia with the patient or authorized representative who has indicated his/her understanding and acceptance.   Dental advisory given  Plan Discussed with: CRNA and Surgeon  Anesthesia Plan Comments:         Anesthesia Quick Evaluation

## 2016-02-22 NOTE — Anesthesia Postprocedure Evaluation (Signed)
Anesthesia Post Note  Patient: Luis Yu  Procedure(s) Performed: Procedure(s) (LRB): CARDIOVERSION (N/A)  Patient location during evaluation: PACU Anesthesia Type: General Level of consciousness: awake and alert Pain management: pain level controlled Vital Signs Assessment: post-procedure vital signs reviewed and stable Respiratory status: spontaneous breathing, nonlabored ventilation, respiratory function stable and patient connected to nasal cannula oxygen Cardiovascular status: blood pressure returned to baseline and stable Postop Assessment: no signs of nausea or vomiting Anesthetic complications: no    Last Vitals:  Vitals:   02/21/16 1320 02/21/16 1330  BP: 136/74   Pulse: 68 67  Resp: 19 (!) 27  Temp:      Last Pain:  Vitals:   02/21/16 1304  TempSrc: Oral                 [name]

## 2016-02-23 NOTE — Telephone Encounter (Signed)
Message documented in and closed by [facility] - I have contacted [facility] to let them know that they have been documenting in the wrong telephone encounter - Spoke with Marylene LandAngela. Pt is being taken care of on their end as well as ours. Will route to my box to follow up and ensure that the patient is called with his results asap.

## 2016-02-23 NOTE — Telephone Encounter (Signed)
Pt. Wife called about his diabetic shoes, what should I tell pt.

## 2016-02-24 ENCOUNTER — Telehealth: Payer: Self-pay | Admitting: Pulmonary Disease

## 2016-02-24 NOTE — Telephone Encounter (Signed)
Spoke with patient wife and explained results. Pt wife had several questions regarding the different between CPAP/BiPAP/ASV device - this has been explained.  Pt wife did however mention the concern pf if the results are adequate given the fact that the patient was in AFIB when he had his titration study. Could him being in AFIB have caused a "bad result"? Patient's wife is wanting to know if maybe the CPAP titration needs to be repeated now that he is not in AFIB. Below are the results.  Please advise Dr Maple HudsonYoung as Dr Vassie LollAlva is not available until 03/05/16. Thanks.    Oretha Milchakesh V Alva, MD 9 days ago   He had persistent centrals on titration study -even on bipap He needs a special machine called ASV Will need toSchedule ASV titration study first to see if this machine takes care of his apneas

## 2016-02-24 NOTE — Telephone Encounter (Signed)
Angela Left message for patient to call you please inform him documentation for diabetic shoes canceled due to multiple signatures and being unable to verify MD signature.  Paperwork was sent to Dr Thomos Lemonsobert Yoo please verify this is who is treating his diabetes so we can refile the paperwork.

## 2016-02-24 NOTE — Telephone Encounter (Signed)
LM x 1 

## 2016-02-24 NOTE — Telephone Encounter (Signed)
Called and spoke with pts wife and she stated that they had a missed call this morning from our office.  She stated that Ashtyn called them. I don't see anything in the chart.  Ashtyn please advise. thanks

## 2016-02-24 NOTE — Telephone Encounter (Signed)
I suggest they schedule the ASV titration study as requested by Dr Vassie LollAlva, for dx complex sleep apnea.  I don't think we can predict that atrial fibrillation will make the previous sleep study inaccurate.

## 2016-02-24 NOTE — Telephone Encounter (Signed)
Called and spoke with pts wife and she is aware of recs from CY.  The ASV is already been ordered and the pts wife stated that they are on the cancellation list if they are able to get him in sooner for this appt.

## 2016-02-24 NOTE — Telephone Encounter (Signed)
See telephone note 02/24/16.

## 2016-02-27 ENCOUNTER — Telehealth: Payer: Self-pay | Admitting: Pulmonary Disease

## 2016-02-27 ENCOUNTER — Encounter (HOSPITAL_COMMUNITY): Payer: Self-pay | Admitting: Nurse Practitioner

## 2016-02-27 ENCOUNTER — Telehealth: Payer: Self-pay | Admitting: Podiatry

## 2016-02-27 ENCOUNTER — Ambulatory Visit (HOSPITAL_COMMUNITY)
Admission: RE | Admit: 2016-02-27 | Discharge: 2016-02-27 | Disposition: A | Discharge status: Home / Self Care | Payer: Medicare Other | Source: Ambulatory Visit | Attending: Nurse Practitioner | Admitting: Nurse Practitioner

## 2016-02-27 VITALS — BP 158/94 | HR 68 | Ht 74.0 in | Wt 235.0 lb

## 2016-02-27 DIAGNOSIS — I483 Typical atrial flutter: Secondary | ICD-10-CM

## 2016-02-27 DIAGNOSIS — Z79899 Other long term (current) drug therapy: Secondary | ICD-10-CM | POA: Insufficient documentation

## 2016-02-27 DIAGNOSIS — G4730 Sleep apnea, unspecified: Secondary | ICD-10-CM | POA: Diagnosis not present

## 2016-02-27 DIAGNOSIS — Z7982 Long term (current) use of aspirin: Secondary | ICD-10-CM | POA: Diagnosis not present

## 2016-02-27 DIAGNOSIS — M1990 Unspecified osteoarthritis, unspecified site: Secondary | ICD-10-CM | POA: Insufficient documentation

## 2016-02-27 DIAGNOSIS — Z6830 Body mass index (BMI) 30.0-30.9, adult: Secondary | ICD-10-CM | POA: Insufficient documentation

## 2016-02-27 DIAGNOSIS — K219 Gastro-esophageal reflux disease without esophagitis: Secondary | ICD-10-CM | POA: Diagnosis not present

## 2016-02-27 DIAGNOSIS — E785 Hyperlipidemia, unspecified: Secondary | ICD-10-CM | POA: Diagnosis not present

## 2016-02-27 DIAGNOSIS — Z823 Family history of stroke: Secondary | ICD-10-CM | POA: Diagnosis not present

## 2016-02-27 DIAGNOSIS — Z794 Long term (current) use of insulin: Secondary | ICD-10-CM | POA: Insufficient documentation

## 2016-02-27 DIAGNOSIS — Z7951 Long term (current) use of inhaled steroids: Secondary | ICD-10-CM | POA: Diagnosis not present

## 2016-02-27 DIAGNOSIS — I10 Essential (primary) hypertension: Secondary | ICD-10-CM | POA: Insufficient documentation

## 2016-02-27 DIAGNOSIS — Z8249 Family history of ischemic heart disease and other diseases of the circulatory system: Secondary | ICD-10-CM | POA: Diagnosis not present

## 2016-02-27 DIAGNOSIS — I2510 Atherosclerotic heart disease of native coronary artery without angina pectoris: Secondary | ICD-10-CM | POA: Diagnosis not present

## 2016-02-27 DIAGNOSIS — E1142 Type 2 diabetes mellitus with diabetic polyneuropathy: Secondary | ICD-10-CM | POA: Diagnosis not present

## 2016-02-27 DIAGNOSIS — G4733 Obstructive sleep apnea (adult) (pediatric): Secondary | ICD-10-CM

## 2016-02-27 DIAGNOSIS — F329 Major depressive disorder, single episode, unspecified: Secondary | ICD-10-CM | POA: Diagnosis not present

## 2016-02-27 DIAGNOSIS — F419 Anxiety disorder, unspecified: Secondary | ICD-10-CM | POA: Insufficient documentation

## 2016-02-27 DIAGNOSIS — Z7901 Long term (current) use of anticoagulants: Secondary | ICD-10-CM | POA: Diagnosis not present

## 2016-02-27 DIAGNOSIS — Z951 Presence of aortocoronary bypass graft: Secondary | ICD-10-CM | POA: Insufficient documentation

## 2016-02-27 DIAGNOSIS — E669 Obesity, unspecified: Secondary | ICD-10-CM | POA: Diagnosis not present

## 2016-02-27 DIAGNOSIS — R0609 Other forms of dyspnea: Secondary | ICD-10-CM | POA: Insufficient documentation

## 2016-02-27 NOTE — Telephone Encounter (Signed)
pts wife called and said Dr Caryl NeverBurchette is the supervising doctor for pts NP provider and if we could please fax the diabetic paperwork to them.

## 2016-02-27 NOTE — Telephone Encounter (Signed)
Spoke with the pt's spouse and notified of recs per RA  She verbalized understanding  [facility]

## 2016-02-27 NOTE — Telephone Encounter (Signed)
Lmtcb.

## 2016-02-27 NOTE — Telephone Encounter (Signed)
Spoke with the pt's spouse and notified of recs per CDY  She verbalized understanding  [facility]

## 2016-02-27 NOTE — Telephone Encounter (Signed)
CY  Would you be able to advise in RA absence. Below is your previous message to this pt.  Pt. Agreed to having the study and the order was placed. He is now concerned that he feels like he is not getting enough air. His wife wanted to know should he continue to use his machine at the pressure that it is at until he has this study.   Luis Budgelinton D Young, MD  Pulmonary Disease    [] Hide copied text [] Hover for attribution information I suggest they schedule the ASV titration study as requested by Dr Vassie LollAlva, for dx complex sleep apnea.  I don't think we can predict that atrial fibrillation will make the previous sleep study inaccurate.

## 2016-02-27 NOTE — Progress Notes (Signed)
Primary Care Physician: Kristian Covey, MD Referring Physician: Dr. Burna Forts is a 70 y.o. male with a h/o CAD,s/p CABG, severe sleep apnea, atrial flutter (on eliquis), poorly controlled T2DM on insulin, peripheral neuropathy, HTN, HLD who presents to the afib clinic for evaluation urgently at the request of Theodore Demark, NP, who pt saw earlier today.  CAD history is of the following...cath of 2011 revealed severe stenosis in the proximal and distal body of the saphenous vein graft to the left-sided PDA. There was normal left ventricular systolic function. The patient had PCI with a drug-eluting stent at that time. He had repeat cardiac cath in Dec of 2011 for dyspnea and f/u abnormal myoview. This revealed an EF of 50%, continued patency ofinternal mammary to the LAD, occluded saphenous vein graft  to the distal circumflex/PDA territory and progressive disease in the saphenous vein graft to the OM. The patient was evaluated by CVTS. Distal vessels werefelt to be poor targets. It was decided that CABG could be considered in the future if PCI options were exhausted. The patient had PCI of the saphenous vein graft to the obtuse marginal. He was last seen by Dene Gentry in late July after being admitted to [facility] in May.  He was told that he had a heart attack but was managed medically.  He was in urgent care after this for SOB. EKG showed new atrial flutter. EKG on last appt demonstrated atrial flutter.  A monitor that  was placed showed persistent  atrial flutter.  At that appt the Plavix was stopped and Eliquis was started.  A stress test was recommended. The patient had no ischemia.   He was set up for cardioversion 8/24 and was seen back in the afib clinic 8/31 and was found to be in SR.    He has had onging progressive shortness of breath and cough has seen many providers since cardioversion. He is thought to have central sleep apnea and just had a repeat  sleep study but did not sleep well enough to obtain study. Only one EKG was done in pulmonary over the last few months and it was read as SR but definite flutter waves were present. HR has been reported in the 60-70's on office visits. CT of chest yesterday showed small left and right pleural effusions with BNP of 247. Does not appear fluid overloaded but definitely has exertional dyspnea with walking a very short distance (10-15 feet) Echo yesterday reveals normal EF.  He returns from [facility] 12/11 and is in [facility]. His dyspnea has improved over the last few days and he can walk a greater distance form previous. He is still pending a sleep study for central apnea.   Today, he c/o symptoms of  progressive exertional dyspnea associated with dizziness, improved since . No orthopnea, PND, lower extremity edema,   presyncope, syncope, or neurologic sequela. The patient is tolerating medications without difficulties and is otherwise without complaint today.   Past Medical History:  Diagnosis Date  . Adjustment disorder with mixed emotional features   . Atrial flutter (HCC)   . CAD (coronary artery disease)   . Depression   . Diabetes mellitus type II, uncontrolled (HCC)   . Diabetic peripheral neuropathy (HCC)   . Erectile dysfunction   . GERD (gastroesophageal reflux disease)   . Herpes zoster   . Hyperlipidemia   . Hypertension   . Obesity   . Osteoarthritis   . Postherpetic neuralgia   .  Sleep apnea    Past Surgical History:  Procedure Laterality Date  . CARDIOVERSION N/A 11/10/2015   Procedure: CARDIOVERSION;  Surgeon: Laurey Moralealton S McLean, MD;  [facility];  Service: Cardiovascular;  Laterality: N/A;  . CARDIOVERSION N/A 02/21/2016   Procedure: CARDIOVERSION;  Surgeon: Lewayne BuntingBrian S Crenshaw, MD;  [facility];  Service: Cardiovascular;  Laterality: N/A;  . CORONARY ARTERY BYPASS GRAFT  1996   eith a LIMA to the LAD, saphenous vein graft to the acute marginal, saphenous vein graft to  the PDA and saphenous vein graft to the circumflex.  [name]. KNEE ARTHROSCOPY  05/2004   right knee  . REPLACEMENT TOTAL KNEE BILATERAL    . SHOULDER SURGERY      Current Outpatient Prescriptions  Medication Sig Dispense Refill  . albuterol (VENTOLIN HFA) 108 (90 Base) MCG/ACT inhaler Inhale 2 puffs into the lungs every 6 (six) hours as needed for wheezing or shortness of breath. 1 Inhaler 2  . apixaban (ELIQUIS) 5 MG TABS tablet Take 1 tablet (5 mg total) by mouth 2 (two) times daily. 60 tablet 11  . aspirin EC 81 MG tablet Take 1 tablet (81 mg total) by mouth daily.    [name]. atorvastatin (LIPITOR) 80 MG tablet Take 1 tablet (80 mg total) by mouth daily. 90 tablet 1  . buPROPion (WELLBUTRIN SR) 150 MG 12 hr tablet Take 150 mg by mouth 2 (two) times daily.     . Cyanocobalamin (B-12) 5000 MCG SUBL Place 1 tablet under the tongue once a week.    . fluticasone (FLONASE) 50 MCG/ACT nasal spray Place 2 sprays into both nostrils daily as needed for allergies.     . furosemide (LASIX) 20 MG tablet Take 1 tablet (20 mg total) by mouth daily as needed for edema. 30 tablet 1  . gabapentin (NEURONTIN) 300 MG capsule Take 600 mg by mouth 2 (two) times daily.    [name]. glipiZIDE (GLUCOTROL) 10 MG tablet Take 1 tablet (10 mg total) by mouth 2 (two) times daily before a meal. 180 tablet 3  . glucose blood (ACCU-CHEK ACTIVE STRIPS) test strip Use as instructed 100 each 12  . Insulin Pen Needle (PEN NEEDLES 31GX5/16") 31G X 8 MM MISC Use to inject insulin 5 times a day. 100 each 11  . isosorbide mononitrate (IMDUR) 30 MG 24 hr tablet Take 1 tablet (30 mg total) by mouth daily. 30 tablet 12  . Lancets (ACCU-CHEK MULTICLIX) lancets Use up to 5 times a day to check blood sugar. 100 each 5  . lisinopril-hydrochlorothiazide (PRINZIDE,ZESTORETIC) 20-12.5 MG tablet TAKE 1 TABLET BY MOUTH EVERY DAY 90 tablet 1  . metFORMIN (GLUCOPHAGE) 1000 MG tablet Take 1 tablet (1,000 mg total) by mouth 2 (two) times daily with a meal. 180  tablet 3  . Nebivolol HCl 20 MG TABS Take 1 tablet (20 mg total) by mouth daily. 90 tablet 1  . NOVOLOG FLEXPEN 100 UNIT/ML FlexPen USE 15 TO 30 UNITS 15 MINUTES BEFORE MEALS 3 TIMES DAILY 15 pen 0  . omeprazole (PRILOSEC) 20 MG capsule TAKE ONE CAPSULE TWICE A DAY BEFORE A MEAL 180 capsule 0  . TOUJEO SOLOSTAR 300 UNIT/ML SOPN INJECT 30 UNITS INTO THE SKIN AT BEDTIME. 4.5 mL 1   Current Facility-Administered Medications  Medication Dose Route Frequency Provider Last Rate Last Dose  . ipratropium-albuterol (DUONEB) 0.5-2.5 (3) MG/3ML nebulizer solution 3 mL  3 mL Nebulization Q6H [name] Nafziger, NP      . pneumococcal 13-valent conjugate vaccine (PREVNAR  13) injection 0.5 mL  0.5 mL Intramuscular Tomorrow-1000 Shirline Freesory Nafziger, NP        No Known Allergies  Social History   Social History  . Marital status: Married    Spouse name: [name]  . Number of children: 1  . Years of education: 5013   Occupational History  . Retired Naval architecttruck driver Retired   Social History Main Topics  . Smoking status: Never Smoker  . Smokeless tobacco: Never Used  . Alcohol use 1.2 oz/week    2 Standard drinks or equivalent per week     Comment: twice a month.  . Drug use: No  . Sexual activity: Yes    Partners: Female   Other Topics Concern  . Not on file   Social History Narrative   Married [name]([name])   Regular exercise: none   Caffeine use: cup of coffee daily    Retired Naval architecttruck driver   Caffeine- twice daily             Family History  Problem Relation Age of Onset  . Coronary artery disease Mother     deceased at 3875  . Coronary artery disease Father     died age 71  . Stroke      half brother  . Stroke Brother     ROS- All systems are reviewed and negative except as per the HPI above  Physical Exam: Vitals:   02/27/16 1409 02/27/16 1523  BP: (!) 180/98 (!) 158/94  Pulse: 68   Weight: 235 lb (106.6 kg)   Height: 6\' 2"  (1.88 m)    Wt Readings from Last 3 Encounters:  02/27/16 235  lb (106.6 kg)  02/21/16 235 lb (106.6 kg)  02/17/16 240 lb 6.4 oz (109 kg)    Labs: Lab Results  Component Value Date   NA 139 02/15/2016   K 4.2 02/15/2016   CL 102 02/15/2016   CO2 30 02/15/2016   GLUCOSE 149 (H) 02/15/2016   BUN 23 02/15/2016   CREATININE 1.15 02/15/2016   CALCIUM 8.9 02/15/2016   Lab Results  Component Value Date   INR 1.18 12/07/2015   Lab Results  Component Value Date   CHOL 142 05/13/2015   HDL 54 05/13/2015   LDLCALC 73 05/13/2015   TRIG 77 05/13/2015  Pulse Ox 95%   GEN- The patient is well appearing, alert and oriented x 3 today.   Head- normocephalic, atraumatic Eyes-  Sclera clear, conjunctiva pink Ears- hearing intact Oropharynx- clear Neck- supple, no JVP Lymph- no cervical lymphadenopathy Lungs- Clear to ausculation bilaterally, normal work of breathing Heart- Regular rate and rhythm, no murmurs, rubs or gallops, PMI not laterally displaced GI- soft, NT, ND, + BS Extremities- no clubbing, cyanosis, or  Trace edema around ankles MS- no significant deformity or atrophy Skin- no rash or lesion Psych- euthymic mood, full affect Neuro- strength and sensation are intact  CMP 11/29 unremarkable CBC- 11/29-remarkable for HGB at 11.0, HCT 34.3  EKG- SR with first degree AV block, st/t wave abnormality, consider lateral ischemia,pr int 220 ms, qrs int 94   qtc 435 ms  CXR- 11/29-Cardiac shadow is stable. Postsurgical changes are again seen. The lungs are well aerated bilaterally. No focal infiltrate or sizable effusion is seen. No acute bony abnormality is noted.  IMPRESSION: No acute abnormality seen.  Echo- 11/30-Study Conclusions  - Left ventricle: The cavity size was mildly dilated. Wall   thickness was increased in a pattern of mild LVH. Systolic  function was normal. The estimated ejection fraction was in the   range of 60% to 65%. The study is not technically sufficient to   allow evaluation of LV diastolic function. -  Aortic valve: AV is thickened, calcified with minimally   restricted motion. - Mitral valve: Moderately calcified annulus. Mildly thickened   leaflets . There was mild regurgitation. - Left atrium: The atrium was moderately dilated. - Right atrium: The atrium was moderately dilated.  CT of chest-11/30- IMPRESSION: No acute pulmonary embolus. Trace left and small right pleural effusions.  Calcified granulomata in the right upper and right middle lobes.  Noncalcified 3 mm right middle lobe nodular density noted previously appears to be related to a tortuous vessel as are previously noted densities in the right upper lobe.  Holter monitor- 8/22-Persistent atrial flutter with variable rate. Premature ectopic complexes.  Of note there is conduction with aberrancy when his rate is faster.   Assessment and Plan: 1.Typical aflutter Successful cardioversion Exertional dyspnea has Improved It is hard to say if all of pt's exertional dyspnea is from atrial flutter but I feel does contribute  Continue Eliquis I discussed with Dr. Johney Frame and he agrees EKG appears to be  typical aflutter, he said he would be glad to discuss flutter ablation with him. Will arrange consult with Dr. Johney Frame  to discuss flutter ablation Pt was given lasix 20 mg by Theodore Demark and can take as needed for increasing shortness of breath, increased LLE or weight gain Continue  with f/u with pulmonology for  possible central sleep apnea, pending repeat sleep study  May benefit from stopping ace to see if chronic cough improves  F/u with Dr. Jens Som 1/4, 12/19 with Dr. Sherene Sires afib clinic as needed  Elvina Sidle. [facility] [address] [place], [place] [address] [phone]

## 2016-02-27 NOTE — Telephone Encounter (Signed)
It looks as if his current CPAP is set on 10. Suggest order DME increase CPAP to 12 cwp pending his next sleep study.

## 2016-03-01 ENCOUNTER — Encounter: Payer: Self-pay | Admitting: Internal Medicine

## 2016-03-01 ENCOUNTER — Ambulatory Visit: Payer: Medicare Other | Admitting: Cardiology

## 2016-03-01 ENCOUNTER — Encounter (HOSPITAL_COMMUNITY): Payer: Self-pay | Admitting: Nurse Practitioner

## 2016-03-02 ENCOUNTER — Emergency Department (HOSPITAL_COMMUNITY)
Admission: EM | Admit: 2016-03-02 | Discharge: 2016-03-02 | Disposition: A | Discharge status: Home / Self Care | Payer: Medicare Other | Attending: Emergency Medicine | Admitting: Emergency Medicine

## 2016-03-02 ENCOUNTER — Encounter (HOSPITAL_COMMUNITY): Payer: Self-pay

## 2016-03-02 ENCOUNTER — Other Ambulatory Visit: Payer: Self-pay | Admitting: Adult Health

## 2016-03-02 ENCOUNTER — Emergency Department (HOSPITAL_COMMUNITY): Payer: Medicare Other

## 2016-03-02 DIAGNOSIS — R51 Headache: Secondary | ICD-10-CM | POA: Diagnosis not present

## 2016-03-02 DIAGNOSIS — Z794 Long term (current) use of insulin: Secondary | ICD-10-CM | POA: Insufficient documentation

## 2016-03-02 DIAGNOSIS — I2510 Atherosclerotic heart disease of native coronary artery without angina pectoris: Secondary | ICD-10-CM | POA: Diagnosis not present

## 2016-03-02 DIAGNOSIS — W01190A Fall on same level from slipping, tripping and stumbling with subsequent striking against furniture, initial encounter: Secondary | ICD-10-CM | POA: Diagnosis not present

## 2016-03-02 DIAGNOSIS — W19XXXA Unspecified fall, initial encounter: Secondary | ICD-10-CM

## 2016-03-02 DIAGNOSIS — Z951 Presence of aortocoronary bypass graft: Secondary | ICD-10-CM | POA: Insufficient documentation

## 2016-03-02 DIAGNOSIS — Y939 Activity, unspecified: Secondary | ICD-10-CM | POA: Insufficient documentation

## 2016-03-02 DIAGNOSIS — Z7901 Long term (current) use of anticoagulants: Secondary | ICD-10-CM | POA: Insufficient documentation

## 2016-03-02 DIAGNOSIS — I10 Essential (primary) hypertension: Secondary | ICD-10-CM | POA: Diagnosis not present

## 2016-03-02 DIAGNOSIS — S0990XA Unspecified injury of head, initial encounter: Secondary | ICD-10-CM | POA: Diagnosis not present

## 2016-03-02 DIAGNOSIS — Z96653 Presence of artificial knee joint, bilateral: Secondary | ICD-10-CM | POA: Diagnosis not present

## 2016-03-02 DIAGNOSIS — Y9289 Other specified places as the place of occurrence of the external cause: Secondary | ICD-10-CM | POA: Insufficient documentation

## 2016-03-02 DIAGNOSIS — Y999 Unspecified external cause status: Secondary | ICD-10-CM | POA: Diagnosis not present

## 2016-03-02 DIAGNOSIS — E1142 Type 2 diabetes mellitus with diabetic polyneuropathy: Secondary | ICD-10-CM | POA: Diagnosis not present

## 2016-03-02 NOTE — ED Provider Notes (Signed)
[facility]EMERGENCY DEPT Provider Note   CSN: [id number] Arrival date & time: 03/02/16  1019     History   Chief Complaint Chief Complaint  Patient presents with  . Fall    HPI Luis Yu is a 70 y.o. male with a PMH of HTN, HLD, T2DM, A-flutter on Eliquis, depression, obesity, OSA presenting to the ED with mechanical fall. Pt states that he was getting out of bed when he slipped on a rug and hit the back of his head on the front of the nightstand. He denies any dizziness, lightheadedness, palpitations, or chest pain preceding the fall. He states he simply lost his balance. He did not lose consciousness after hitting his head. He did not bleed from his head. He denies any current headache, numbness, weakness, confusion, changes in vision, nausea, vomiting. Per his wife, he is acting completely like his normal self.   HPI  Past Medical History:  Diagnosis Date  . Adjustment disorder with mixed emotional features   . Atrial flutter (HCC)   . CAD (coronary artery disease)   . Depression   . Diabetes mellitus type II, uncontrolled (HCC)   . Diabetic peripheral neuropathy (HCC)   . Erectile dysfunction   . GERD (gastroesophageal reflux disease)   . Herpes zoster   . Hyperlipidemia   . Hypertension   . Obesity   . Osteoarthritis   . Postherpetic neuralgia   . Sleep apnea     Patient Active Problem List   Diagnosis Date Noted  . Insomnia 02/02/2016  . Atrial fibrillation (HCC) 10/27/2015  . Uncoordinated movements 02/24/2014  . Unspecified hereditary and idiopathic peripheral neuropathy 02/24/2013  . Low back pain 02/24/2013  . Abnormality of gait 02/09/2013  . Memory loss, short term 12/05/2011  . LUMBAR STRAIN, ACUTE 05/05/2010  . OSA (obstructive sleep apnea) 09/14/2009  . BPH (benign prostatic hyperplasia) 07/27/2009  . CARDIOVASCULAR STUDIES, ABNORMAL 07/27/2009  . CHEST PAIN 06/08/2009  . OSTEOARTHRITIS 05/30/2009  . HEEL PAIN, LEFT 05/24/2009  . Dyspnea 05/24/2009   . ADJUSTMENT DISORDER WITH MIXED FEATURES 08/25/2008  . Diabetes type 2, uncontrolled (HCC) 02/24/2008  . ERECTILE DYSFUNCTION 02/10/2007  . Hyperlipidemia 10/09/2006  . OBESITY 10/09/2006  . Essential hypertension 10/09/2006  . Coronary atherosclerosis 10/09/2006  . GERD 10/09/2006  . DM (diabetes mellitus) type II uncontrolled with retinopathy 06/10/2006    Past Surgical History:  Procedure Laterality Date  . CARDIOVERSION N/A 11/10/2015   Procedure: CARDIOVERSION;  Surgeon: Laurey Morale, MD;  Location: [facility] ENDOSCOPY;  Service: Cardiovascular;  Laterality: N/A;  . CARDIOVERSION N/A 02/21/2016   Procedure: CARDIOVERSION;  Surgeon: Lewayne Bunting, MD;  Location[facility] ENDOSCOPY;  Service: Cardiovascular;  Laterality: N/A;  . CORONARY ARTERY BYPASS GRAFT  1996   eith a LIMA to the LAD, saphenous vein graft to the acute marginal, saphenous vein graft to the PDA and saphenous vein graft to the circumflex.  [name] KNEE ARTHROSCOPY  05/2004   right knee  . REPLACEMENT TOTAL KNEE BILATERAL    . SHOULDER SURGERY         Home Medications    Prior to Admission medications   Medication Sig Start Date End Date Taking? Authorizing Provider  albuterol (VENTOLIN HFA) 108 (90 Base) MCG/ACT inhaler Inhale 2 puffs into the lungs every 6 (six) hours as needed for wheezing or shortness of breath. 02/16/16   Bevelyn Ngo, NP  apixaban (ELIQUIS) 5 MG TABS tablet Take 1 tablet (5 mg total) by mouth 2 (two) times  daily. 11/17/15   Newman Niponna C Carroll, NP  aspirin EC 81 MG tablet Take 1 tablet (81 mg total) by mouth daily. 10/14/15   Brittainy Sherlynn CarbonM Simmons, PA-C  atorvastatin (LIPITOR) 80 MG tablet Take 1 tablet (80 mg total) by mouth daily. 10/21/15   Shirline Freesory Nafziger, NP  buPROPion (WELLBUTRIN SR) 150 MG 12 hr tablet Take 150 mg by mouth 2 (two) times daily.  01/20/16   Historical Provider, MD  buPROPion (WELLBUTRIN SR) 150 MG 12 hr tablet TAKE 1 TABLET TWICE A DAY 03/02/16   Shirline Freesory Nafziger, NP  Cyanocobalamin (B-12)  5000 MCG SUBL Place 1 tablet under the tongue once a week.    Historical Provider, MD  fluticasone (FLONASE) 50 MCG/ACT nasal spray Place 2 sprays into both nostrils daily as needed for allergies.  06/29/13   Historical Provider, MD  furosemide (LASIX) 20 MG tablet Take 1 tablet (20 mg total) by mouth daily as needed for edema. 02/17/16 05/17/16  Rhonda G Barrett, PA-C  gabapentin (NEURONTIN) 300 MG capsule Take 600 mg by mouth 2 (two) times daily. 09/06/15   Historical Provider, MD  glipiZIDE (GLUCOTROL) 10 MG tablet Take 1 tablet (10 mg total) by mouth 2 (two) times daily before a meal. 10/21/15   Shirline Freesory Nafziger, NP  glucose blood (ACCU-CHEK ACTIVE STRIPS) test strip Use as instructed 10/22/14   Doe-Hyun R Artist PaisYoo, DO  Insulin Pen Needle (PEN NEEDLES 31GX5/16") 31G X 8 MM MISC Use to inject insulin 5 times a day. 07/21/15   Doe-Hyun R Artist PaisYoo, DO  isosorbide mononitrate (IMDUR) 30 MG 24 hr tablet Take 1 tablet (30 mg total) by mouth daily. 10/31/15   Rollene RotundaJames Hochrein, MD  Lancets (ACCU-CHEK MULTICLIX) lancets Use up to 5 times a day to check blood sugar. 06/05/13   Doe-Hyun R Artist PaisYoo, DO  lisinopril-hydrochlorothiazide (PRINZIDE,ZESTORETIC) 20-12.5 MG tablet TAKE 1 TABLET BY MOUTH EVERY DAY 01/12/16   Shirline Freesory Nafziger, NP  metFORMIN (GLUCOPHAGE) 1000 MG tablet Take 1 tablet (1,000 mg total) by mouth 2 (two) times daily with a meal. 10/21/15   Shirline Freesory Nafziger, NP  Nebivolol HCl 20 MG TABS Take 1 tablet (20 mg total) by mouth daily. 10/21/15   Shirline Freesory Nafziger, NP  NOVOLOG FLEXPEN 100 UNIT/ML FlexPen USE 15 TO 30 UNITS 15 MINUTES BEFORE MEALS 3 TIMES DAILY 01/30/16   Shirline Freesory Nafziger, NP  omeprazole (PRILOSEC) 20 MG capsule TAKE ONE CAPSULE TWICE A DAY BEFORE A MEAL 12/23/15   Shirline Freesory Nafziger, NP  TOUJEO SOLOSTAR 300 UNIT/ML SOPN INJECT 30 UNITS INTO THE SKIN AT BEDTIME. 09/23/15   Shirline Freesory Nafziger, NP    Family History Family History  Problem Relation Age of Onset  . Coronary artery disease Mother     deceased at 1475  . Coronary artery disease  Father     died age 71  . Stroke      half brother  . Stroke Brother     Social History Social History  Substance Use Topics  . Smoking status: Never Smoker  . Smokeless tobacco: Never Used  . Alcohol use 1.2 oz/week    2 Standard drinks or equivalent per week     Comment: twice a month.     Allergies   Patient has no known allergies.   Review of Systems Review of Systems 10 Systems reviewed and are negative for acute change except as noted in the HPI.  Physical Exam Updated Vital Signs BP 102/67   Pulse 66   Temp 98.1 F (36.7 C) (Oral)  Resp 18   SpO2 98%   Physical Exam  Constitutional: He is oriented to person, place, and time. He appears well-developed and well-nourished.  HENT:  Head: Normocephalic and atraumatic.  Mouth/Throat: Oropharynx is clear and moist.  No lacerations, ecchymoses, or areas of swelling present  Eyes: Conjunctivae and EOM are normal. Pupils are equal, round, and reactive to light.  Neck: Normal range of motion. Neck supple.  Cardiovascular: Normal rate and regular rhythm.   No murmur heard. Pulmonary/Chest: Effort normal. No respiratory distress.  Abdominal: Soft. Bowel sounds are normal. He exhibits no distension. There is no tenderness. There is no rebound and no guarding.  Musculoskeletal: Normal range of motion. He exhibits no edema, tenderness or deformity.  Neurological: He is alert and oriented to person, place, and time. He displays normal reflexes. No cranial nerve deficit or sensory deficit. He exhibits normal muscle tone. He displays a negative Romberg sign. Coordination normal.  Skin: Skin is warm and dry.  Psychiatric: He has a normal mood and affect. His behavior is normal. Thought content normal.  Nursing note and vitals reviewed.    ED Treatments / Results  Labs (all labs ordered are listed, but only abnormal results are displayed) Labs Reviewed - No data to display  EKG  EKG Interpretation None        Radiology Ct Head Wo Contrast  Result Date: 03/02/2016 CLINICAL DATA:  Pain following fall EXAM: CT HEAD WITHOUT CONTRAST TECHNIQUE: Contiguous axial images were obtained from the base of the skull through the vertex without intravenous contrast. COMPARISON:  December 07, 2015. FINDINGS: Brain: There is mild diffuse atrophy. There is no evident intracranial mass, hemorrhage, extra-axial fluid collection, or midline shift. There is patchy small vessel disease in the centra semiovale bilaterally. Elsewhere gray-white compartments are normal. No acute infarct evident. Vascular: There is no hyperdense vessel. There is calcification in each distal vertebral artery. There is also calcification in each carotid siphon region. Skull: The bony calvarium appears intact. Sinuses/Orbits: Visualized paranasal sinuses clear. There is mild rightward deviation of the nasal septum. Orbits appear symmetric bilaterally. Other: The mastoid air cells are clear. There is mild debris in the right external auditory canal. IMPRESSION: Mild atrophy with patchy periventricular small vessel disease. No intracranial mass hemorrhage, or evidence of acute infarct. There are foci of vascular calcification. There is probable cerumen left external auditory canal. Electronically Signed   By: Bretta BangWilliam  Woodruff III M.D.   On: 03/02/2016 11:59    Procedures Procedures (including critical care time)  Medications Ordered in ED Medications - No data to display   Initial Impression / Assessment and Plan / ED Course  I have reviewed the triage vital signs and the nursing notes.  Pertinent imaging results that were available during my care of the patient were reviewed by me and considered in my medical decision making (see chart for details).  Clinical Course    Luis HillDonald is a 70 year old male with a PMH of HTN, HLD, T2DM, A-flutter on Eliquis, depression, obesity, OSA presenting to the ED with head trauma after a mechanical fall. Pt  did not lose consciousness. He did not have any concerning presyncopal or syncopal symptoms and it sounds like he just slipped on the rug in his bedroom. Will perform a CT head given that Pt is on Eliquis to rule out intracranial bleed.  12:20PM: CT head without bleed. Will discharge Pt home with PCP follow-up.  Final Clinical Impressions(s) / ED Diagnoses   Final diagnoses:  Fall, initial encounter  Injury of head, initial encounter    New Prescriptions New Prescriptions   No medications on file     Campbell Stall, MD 03/02/16 1234    Cathren Laine, MD 03/02/16 1353

## 2016-03-02 NOTE — ED Triage Notes (Signed)
Pt presents for evaluation of mechanical fall out of bed this AM and hit posterior head. Pt. States on elliquis for afib. Wife denies mental status changes, hx of dementia. Pt. Alert to baseline, Oriented to person, place, situation. No swelling or tenderness to to head.

## 2016-03-02 NOTE — Discharge Instructions (Signed)
It was so nice to meet you!  We did a CT scan of your head to make sure you weren't bleeding into your brain. The CT of your head did not show a bleed.   If you start having changes in vision, nausea, vomiting, or severe headaches, please come back to the emergency department.

## 2016-03-06 ENCOUNTER — Encounter: Payer: Self-pay | Admitting: Internal Medicine

## 2016-03-06 ENCOUNTER — Ambulatory Visit (INDEPENDENT_AMBULATORY_CARE_PROVIDER_SITE_OTHER): Payer: Medicare Other | Admitting: Internal Medicine

## 2016-03-06 VITALS — BP 122/74 | HR 70 | Ht 74.0 in | Wt 226.0 lb

## 2016-03-06 DIAGNOSIS — R0609 Other forms of dyspnea: Secondary | ICD-10-CM

## 2016-03-06 DIAGNOSIS — I10 Essential (primary) hypertension: Secondary | ICD-10-CM

## 2016-03-06 DIAGNOSIS — I25810 Atherosclerosis of coronary artery bypass graft(s) without angina pectoris: Secondary | ICD-10-CM | POA: Diagnosis not present

## 2016-03-06 NOTE — Patient Instructions (Addendum)
Until cough and swallowing problem are both  gone >>  prilosec 20 mg Take 30- 60 min before your first and last meals of the day   No mint or menthol products- can use sugarless lozenges instead like jolley ranchers   You will need a new blood pressure pill to replace the lisinopril for a minimum of 6 weeks  Keep appt to see Dr Vassie LollAlva in February but call sooner if needed

## 2016-03-06 NOTE — Assessment & Plan Note (Signed)
Symptoms are markedly disproportionate to objective findings and not clear this is a lung problem but pt does appear to have difficult airway management issues. DDX of  difficult airways management almost all start with A and  include Adherence, Ace Inhibitors, Acid Reflux, Active Sinus Disease, Alpha 1 Antitripsin deficiency, Anxiety masquerading as Airways dz,  ABPA,  Allergy(esp in young), Aspiration (esp in elderly), Adverse effects of meds,  Active smokers, A bunch of PE's (a small clot burden can't cause this syndrome unless there is already severe underlying pulm or vascular dz with poor reserve) plus two Bs  = Bronchiectasis and Beta blocker use..and one C= CHF  Adherence is always the initial "prime suspect" and is a multilayered concern that requires a "trust but verify" approach in every patient - starting with knowing how to use medications, especially inhalers, correctly, keeping up with refills and understanding the fundamental difference between maintenance and prns vs those medications only taken for a very short course and then stopped and not refilled.  - on a complex regimen, hard of hearing > would be very careful with med reconciliation: The principal here is that until we are certain that the  patients are doing what we've asked  Them  to do, it makes no sense to ask them to do more - ie the trust but verify approach will work best here.    ACEi adverse effects at the  top of the usual list of suspects and the only way to rule it out is a trial off > see a/p    ? Acid (or non-acid) GERD > always difficult to exclude as up to 75% of pts in some series report no assoc GI/ Heartburn symptoms> rec max (24h)  acid suppression and diet restrictions/ reviewed and instructions given in writing.   ? Allergy/ asthma > note never smoker, no resp to saba/ nothing else to suggest true asthma  ? Anxiety > usually at the bottom of this list of usual suspects but should be much higher on this pt's  based on H and P and note already on psychotropics .   ? BB effect > very unlikely on bystolic rx    ? CHF > echo shows mod lae but bnp not impressive   I had an extended discussion with the patient reviewing all relevant studies completed to date and  lasting 25 minutes of a 40  minute f/u office visit in pt new to me   re  non-specific but potentially very serious pulmonary symptoms of unknown etiology.  Each maintenance medication was reviewed in detail including most importantly the difference between maintenance and prns and under what circumstances the prns are to be triggered using an action plan format that is not reflected in the computer generated alphabetically organized AVS.    Please see AVS for unique instructions that I personally wrote and verbalized to the the pt in detail and then reviewed with pt  by my nurse highlighting any  changes in therapy recommended at today's visit to their plan of care.

## 2016-03-06 NOTE — Assessment & Plan Note (Signed)
In the best review of chronic cough to date ( NEJM 2016 375 [phone]) ,  ACEi are now felt to cause cough in up to  20% of pts which is a 4 fold increase from previous reports and does not include the variety of non-specific complaints we see in pulmonary clinic in pts on ACEi but previously attributed to another dx like  Copd/asthma and  include PNDS, throat and chest congestion, "bronchitis", unexplained dyspnea and noct "strangling" sensations, and hoarseness, but also  atypical /refractory GERD symptoms like dysphagia and "bad heartburn" (clearly the case here)   The only way I know  to prove this is not an "ACEi Case" is a trial off ACEi x a minimum of 6 weeks then regroup.   Follow up per Primary Care planned  03/07/16 defer choice to PCP but prefer diovan over cozar

## 2016-03-06 NOTE — Progress Notes (Signed)
Subjective:     Patient ID: Luis Yu, male   DOB: 12-17-1945,   MRN: [id number]  HPI  [age] yowm never smoker s/p cardioversion with unexplained sob with activity and while talking seen by NP 02/15/16 rec 2D Echo > done 11/301/7 with nl ef/ Mod LAE/ RAE   Referral to Cardiology > atrial flutter cardioverted again 02/21/16 only a little less sob  CT Angio Chest 02/16/16: No acute pulmonary embolus. Trace left and small right pleural effusions. Calcified granulomata in the right upper and right middle lobes. Noncalcified 3 mm right middle lobe nodular density noted previously appears to be related to a tortuous vessel as are previously noted densities in the right upper lobe Please use your walker whenever you are walking as you have had several falls and are on a blood thinner We will refer you to Neurology for evaluation of your falls. Pro Air Albuterol inhaler  2 puffs as needed for shortness of breath or wheezing up to every 6 hours.   03/06/2016  Extended f/u ov/Suraj Ramdass re: new eval for sob talking/ still on ACEi  Chief Complaint  Patient presents with  . Follow-up    Breathing has slightly improved. He is losing wt unintentionally.   variable sob at rest with talking assoc with dry hacking cough no better on saba Also doe x > 50 ft walking slow pace  Assoc with overt HB and dysphagia on prn ppi / no obvious nasal symptoms, itching, pnds/ or wheeze    No obvious day to day or daytime variability or assoc excess/ purulent sputum or mucus plugs or hemoptysis or cp or chest tightness, subjective wheeze or overt sinus or hb symptoms. No unusual exp hx or h/o childhood pna/ asthma or knowledge of premature birth.  Sleeping ok without nocturnal  or early am exacerbation  of respiratory  c/o's or need for noct saba. Also denies any obvious fluctuation of symptoms with weather or environmental changes or other aggravating or alleviating factors except as outlined above   Current  Medications, Allergies, Complete Past Medical History, Past Surgical History, Family History, and Social History were reviewed in [facility] electronic medical record.  ROS  The following are not active complaints unless bolded sore throat, dysphagia, dental problems, itching, sneezing,  nasal congestion or excess/ purulent secretions, ear ache,   fever, chills, sweats, unintended wt loss, classically pleuritic or exertional cp,  orthopnea pnd or leg swelling, presyncope, palpitations, abdominal pain, anorexia, nausea, vomiting, diarrhea  or change in bowel or bladder habits, change in stools or urine, dysuria,hematuria,  rash, arthralgias, visual complaints, headache, numbness, weakness or ataxia or problems with walking or coordination,  change in mood/affect or memory.           Review of Systems     Objective:   Physical Exam    hoarse amb wm very hoh / classic pseudowheeze  Wt Readings from Last 3 Encounters:  03/06/16 226 lb (102.5 kg)  02/27/16 235 lb (106.6 kg)  02/21/16 235 lb (106.6 kg)    Vital signs reviewed  - Note on arrival 02 sats  95% on RA    HEENT: nl dentition, turbinates, and oropharynx. Nl external ear canals without cough reflex   NECK :  without JVD/Nodes/TM/ nl carotid upstrokes bilaterally   LUNGS: no acc muscle use,  Nl contour chest which is clear to A and P bilaterally without cough on insp or exp maneuvers   CV:  RRR  no s3 or murmur  or increase in P2, nad no edema   ABD:  soft and nontender with nl inspiratory excursion in the supine position. No bruits or organomegaly appreciated, bowel sounds nl  MS:  Nl gait/ ext warm without deformities, calf tenderness, cyanosis or clubbing No obvious joint restrictions   SKIN: warm and dry without lesions    NEURO:  alert, approp, nl sensorium with  no motor or cerebellar deficits apparent.    CTa Chest 02/16/16 neg PE/ ild    Labs reviewed:     Chemistry      Component Value Date/Time   NA  139 02/15/2016 1734   K 4.2 02/15/2016 1734   CL 102 02/15/2016 1734   CO2 30 02/15/2016 1734   BUN 23 02/15/2016 1734   CREATININE 1.15 02/15/2016 1734   CREATININE 1.21 (H) 10/31/2015 1042      Component Value Date/Time   CALCIUM 8.9 02/15/2016 1734   ALKPHOS 93 02/15/2016 1734   AST 16 02/15/2016 1734   ALT 17 02/15/2016 1734   BILITOT 1.2 02/15/2016 1734        Lab Results  Component Value Date   WBC 7.1 02/15/2016   HGB 11.0 (L) 02/15/2016   HCT 34.3 (L) 02/15/2016   MCV 93.8 02/15/2016   PLT 151.0 02/15/2016      Lab Results  Component Value Date   TSH 1.15 10/14/2015     Lab Results  Component Value Date   PROBNP 247.0 (H) 02/15/2016        Assessment:

## 2016-03-07 ENCOUNTER — Ambulatory Visit (INDEPENDENT_AMBULATORY_CARE_PROVIDER_SITE_OTHER): Payer: Medicare Other | Admitting: Family Medicine

## 2016-03-07 ENCOUNTER — Encounter: Payer: Self-pay | Admitting: Family Medicine

## 2016-03-07 VITALS — BP 110/62 | HR 86 | Temp 97.4°F | Ht 74.0 in | Wt 226.0 lb

## 2016-03-07 DIAGNOSIS — Z9181 History of falling: Secondary | ICD-10-CM

## 2016-03-07 DIAGNOSIS — H35371 Puckering of macula, right eye: Secondary | ICD-10-CM | POA: Diagnosis not present

## 2016-03-07 DIAGNOSIS — R05 Cough: Secondary | ICD-10-CM | POA: Diagnosis not present

## 2016-03-07 DIAGNOSIS — I10 Essential (primary) hypertension: Secondary | ICD-10-CM | POA: Diagnosis not present

## 2016-03-07 DIAGNOSIS — H04123 Dry eye syndrome of bilateral lacrimal glands: Secondary | ICD-10-CM | POA: Diagnosis not present

## 2016-03-07 DIAGNOSIS — I25810 Atherosclerosis of coronary artery bypass graft(s) without angina pectoris: Secondary | ICD-10-CM | POA: Diagnosis not present

## 2016-03-07 DIAGNOSIS — E113293 Type 2 diabetes mellitus with mild nonproliferative diabetic retinopathy without macular edema, bilateral: Secondary | ICD-10-CM | POA: Diagnosis not present

## 2016-03-07 DIAGNOSIS — R053 Chronic cough: Secondary | ICD-10-CM

## 2016-03-07 DIAGNOSIS — H16143 Punctate keratitis, bilateral: Secondary | ICD-10-CM | POA: Diagnosis not present

## 2016-03-07 DIAGNOSIS — Z961 Presence of intraocular lens: Secondary | ICD-10-CM | POA: Diagnosis not present

## 2016-03-07 MED ORDER — VALSARTAN-HYDROCHLOROTHIAZIDE 80-12.5 MG PO TABS: 1.0000 | ORAL_TABLET | Freq: Every day | ORAL | 6 refills | Status: DC

## 2016-03-07 NOTE — Patient Instructions (Signed)
STOP Lisinopril HCTZ Start Valsartan HCTZ   Fall Prevention in the Home Falls can cause injuries and can affect people from all age groups. There are many simple things that you can do to make your home safe and to help prevent falls. What can I do on the outside of my home?  Regularly repair the edges of walkways and driveways and fix any cracks.  Remove high doorway thresholds.  Trim any shrubbery on the main path into your home.  Use bright outdoor lighting.  Clear walkways of debris and clutter, including tools and rocks.  Regularly check that handrails are securely fastened and in good repair. Both sides of any steps should have handrails.  Install guardrails along the edges of any raised decks or porches.  Have leaves, snow, and ice cleared regularly.  Use sand or salt on walkways during winter months.  In the garage, clean up any spills right away, including grease or oil spills. What can I do in the bathroom?  Use night lights.  Install grab bars by the toilet and in the tub and shower. Do not use towel bars as grab bars.  Use non-skid mats or decals on the floor of the tub or shower.  If you need to sit down while you are in the shower, use a plastic, non-slip stool.  Keep the floor dry. Immediately clean up any water that spills on the floor.  Remove soap buildup in the tub or shower on a regular basis.  Attach bath mats securely with double-sided non-slip rug tape.  Remove throw rugs and other tripping hazards from the floor. What can I do in the bedroom?  Use night lights.  Make sure that a bedside light is easy to reach.  Do not use oversized bedding that drapes onto the floor.  Have a firm chair that has side arms to use for getting dressed.  Remove throw rugs and other tripping hazards from the floor. What can I do in the kitchen?  Clean up any spills right away.  Avoid walking on wet floors.  Place frequently used items in easy-to-reach  places.  If you need to reach for something above you, use a sturdy step stool that has a grab bar.  Keep electrical cables out of the way.  Do not use floor polish or wax that makes floors slippery. If you have to use wax, make sure that it is non-skid floor wax.  Remove throw rugs and other tripping hazards from the floor. What can I do in the stairways?  Do not leave any items on the stairs.  Make sure that there are handrails on both sides of the stairs. Fix handrails that are broken or loose. Make sure that handrails are as long as the stairways.  Check any carpeting to make sure that it is firmly attached to the stairs. Fix any carpet that is loose or worn.  Avoid having throw rugs at the top or bottom of stairways, or secure the rugs with carpet tape to prevent them from moving.  Make sure that you have a light switch at the top of the stairs and the bottom of the stairs. If you do not have them, have them installed. What are some other fall prevention tips?  Wear closed-toe shoes that fit well and support your feet. Wear shoes that have rubber soles or low heels.  When you use a stepladder, make sure that it is completely opened and that the sides are firmly locked. Have  someone hold the ladder while you are using it. Do not climb a closed stepladder.  Add color or contrast paint or tape to grab bars and handrails in your home. Place contrasting color strips on the first and last steps.  Use mobility aids as needed, such as canes, walkers, scooters, and crutches.  Turn on lights if it is dark. Replace any light bulbs that burn out.  Set up furniture so that there are clear paths. Keep the furniture in the same spot.  Fix any uneven floor surfaces.  Choose a carpet design that does not hide the edge of steps of a stairway.  Be aware of any and all pets.  Review your medicines with your healthcare provider. Some medicines can cause dizziness or changes in blood pressure,  which increase your risk of falling. Talk with your health care provider about other ways that you can decrease your risk of falls. This may include working with a physical therapist or trainer to improve your strength, balance, and endurance. This information is not intended to replace advice given to you by your health care provider. Make sure you discuss any questions you have with your health care provider. Document Released: 02/23/2002 Document Revised: 08/02/2015 Document Reviewed: 04/09/2014 Elsevier Interactive Patient Education  2017 [employer].

## 2016-03-07 NOTE — Progress Notes (Signed)
Pre visit review using our clinic review tool, if applicable. No additional management support is needed unless otherwise documented below in the visit note. 

## 2016-03-07 NOTE — Progress Notes (Signed)
Subjective:     Patient ID: Luis Yu, male   DOB: [birth year], 70 y.o.   MRN: [id number]  HPI Patient apparently was erroneously scheduled with me versus his primary. He has complex past medical history and is seen for ER follow-up. On 03/02/16 he was apparently getting out of bed and slipped and fell between the bed and nightstand and struck back of his head on a nightstand. No loss of consciousness. He is on a blood thinner and wife took him into the ER for further evaluation. There was no confusion. No headache. No syncope. CT head no acute findings. He has not had any falls since then. He has very unstable gait and has a walker but does not always use this consistently. He has other chronic problems Including atrial flutter, CAD, hypertension, GERD, type 2 diabetes, peripheral neuropathy  He is followed both by cardiology and pulmonary. Apparently is going to be evaluated for possible ablation for his A. fib/flutter. He had cardioversion early December. He just recently saw pulmonary. Had some chronic cough and there was suggestion to consider discontinue ACE inhibitor. He takes lisinopril HCTZ 20/12.5 one daily. Cardiology had also suggested possible discontinuation of ACE inhibitor. Wife states he has had cough for months. No hemoptysis.  Past Medical History:  Diagnosis Date  . Adjustment disorder with mixed emotional features   . Atrial flutter (HCC)   . CAD (coronary artery disease)   . Depression   . Diabetes mellitus type II, uncontrolled (HCC)   . Diabetic peripheral neuropathy (HCC)   . Erectile dysfunction   . GERD (gastroesophageal reflux disease)   . Herpes zoster   . Hyperlipidemia   . Hypertension   . Obesity   . Osteoarthritis   . Postherpetic neuralgia   . Sleep apnea    Past Surgical History:  Procedure Laterality Date  . CARDIOVERSION N/A 11/10/2015   Procedure: CARDIOVERSION;  Surgeon: Laurey Moralealton S McLean, MD;  [facility];  Service: Cardiovascular;   Laterality: N/A;  . CARDIOVERSION N/A 02/21/2016   Procedure: CARDIOVERSION;  Surgeon: Lewayne BuntingBrian S Crenshaw, MD;  [facility];  Service: Cardiovascular;  Laterality: N/A;  . CORONARY ARTERY BYPASS GRAFT  1996   eith a LIMA to the LAD, saphenous vein graft to the acute marginal, saphenous vein graft to the PDA and saphenous vein graft to the circumflex.  [name]. KNEE ARTHROSCOPY  05/2004   right knee  . REPLACEMENT TOTAL KNEE BILATERAL    . SHOULDER SURGERY      reports that he has never smoked. He has never used smokeless tobacco. He reports that he drinks about 1.2 oz of alcohol per week . He reports that he does not use drugs. family history includes Coronary artery disease in his father and mother; Stroke in his brother. No Known Allergies   Review of Systems  Constitutional: Negative for fatigue and unexpected weight change.  Eyes: Negative for visual disturbance.  Respiratory: Positive for cough and shortness of breath. Negative for chest tightness and wheezing.   Cardiovascular: Negative for chest pain, palpitations and leg swelling.  Endocrine: Negative for polydipsia and polyuria.  Genitourinary: Negative for dysuria.  Neurological: Negative for dizziness, syncope, weakness, light-headedness and headaches.       Objective:   Physical Exam  Constitutional: He is oriented to person, place, and time. He appears well-developed and well-nourished.  HENT:  Right Ear: External ear normal.  Left Ear: External ear normal.  Mouth/Throat: Oropharynx is clear and moist.  Eyes: Pupils  are equal, round, and reactive to light.  Neck: Neck supple. No thyromegaly present.  Cardiovascular: Normal rate and regular rhythm.   Pulmonary/Chest: Effort normal and breath sounds normal. No respiratory distress. He has no wheezes. He has no rales.  Musculoskeletal: He exhibits no edema.  Neurological: He is alert and oriented to person, place, and time. No cranial nerve deficit.        Assessment:     #1 hypertension stable  #2 recent fall with negative CT head  #3 chronic atrial fib/flutter  #4 chronic cough possibly related ACE inhibitor  #5 high-risk for falls    Plan:     -Discontinue lisinopril HCTZ -Start valsartan-HCTZ 80/12.5 one daily -He has follow-up with primary in early January -Strongly encouraged to use walker at all times -Follow-up with cardiology as scheduled  Kristian CoveyBruce W Kendricks Reap MD [facility]

## 2016-03-08 ENCOUNTER — Telehealth: Payer: Self-pay | Admitting: Pulmonary Disease

## 2016-03-08 ENCOUNTER — Other Ambulatory Visit: Payer: Self-pay | Admitting: Internal Medicine

## 2016-03-08 NOTE — Telephone Encounter (Signed)
Per Dr Vassie LollAlva,  CPAP auto download showed persistent central apneas and CSR ASV (study at sleep center)  has been rescheduled for 03/27/16.

## 2016-03-09 NOTE — Telephone Encounter (Signed)
Spoke with pt's wife. She was already aware that this has been rescheduled. Nothing further was needed at this time.

## 2016-03-13 ENCOUNTER — Telehealth: Payer: Self-pay | Admitting: Family Medicine

## 2016-03-13 MED ORDER — GABAPENTIN 300 MG PO CAPS: 600.0000 mg | ORAL_CAPSULE | Freq: Two times a day (BID) | ORAL | 1 refills | Status: DC

## 2016-03-13 NOTE — Telephone Encounter (Signed)
Rx sent in

## 2016-03-13 NOTE — Telephone Encounter (Signed)
[facility] - Client TELEPHONE ADVICE RECORD [facility] Patient Name: Luis DaviesDONALD Balon Gender: Male DOB: 28-Dec-1945 Age: [age] Y 8 M 25 D Return Phone Number: [phone] (Primary), [phone] (Secondary) Address: [place]: [name] [id number] Client [facility] - Client Client Site  [facility] - Night Physician Evelene CroonNafziger, Kandee Keenory - NP Contact Type Call Who Is Calling Patient / Member / Family / Caregiver Call Type Triage / Clinical Caller Name Isa RankinGlenna Lazar Relationship To Patient Spouse Return Phone Number [phone] (Primary) Chief Complaint Prescription Refill or Medication Request (non symptomatic) Reason for Call Symptomatic / Request for Health Information Initial Comment Caller's husband needs a Rx filled (Gabapentin 300mg ) for his diabetic neuropathy. Pharmacy says it needs a Dr. authorization. Only has one pill left, is wondering if he'll be okay without it till next week or if an on call could call it in. Translation No Nurse Assessment Nurse: Chestine Sporelark, RN, Marchelle FolksAmanda Date/Time (Eastern Time): 03/11/2016 8:31:41 AM Confirm and document reason for call. If symptomatic, describe symptoms. ---Caller states husband needs a Rx filled (Gabapentin 300mg ) for his diabetic neuropathy. They pharmacy says it is now a controlled drug and want refill or give loaner. Does the patient have any new or worsening symptoms? ---No Please document clinical information provided and list any resource used. ---Advised that now since it is controlled that it will have to be handled when the office is opened. Advised to call them when they are opened and to call back with further questions or concerns. Advised that if he is needing the medication this weekend that he would have to be seen somewhere. Denies further questions or concerns. Guidelines Guideline Title Affirmed Question Affirmed Notes  Nurse Date/Time (Eastern Time) Disp. Time Lamount Cohen(Eastern Time) Disposition Final User 03/10/2016 3:49:56 PM Send To Extended Follow Up Debera Latalston, RN, Marylene Landngela 03/11/2016 8:33:54 AM Clinical Call Yes Chestine Sporelark, RN, Marchelle FolksAmanda

## 2016-03-13 NOTE — Progress Notes (Signed)
HPI: FU CAD and atrial flutter. Abdominal ultrasound in 2003 showed no aneurysm. Patient has had previous PCI of the saphenous vein graft to the PDA  Cardiac cath in Dec of 2011 revealed an EF of 50. Continued patency internal mammary to the LAD. Occluded saphenous vein graft to the recent percutaneous intervention to the distal circumflex/PDA territory. Progressive disease in the saphenous vein graft to the OM. Patient evaluated by CVTS. Distal vessels felt to be poor targets. CABG could be considered in the future if PCI options were exhausted. Patient had PCI of the saphenous vein graft to the obtuse marginal. Carotid Dopplers February 2016 showed moderate plaque but no hemodynamically significant stenosis. Nuclear study August 2017 showed ejection fraction 48% and no ischemia or infarction.  Echocardiogram November 2017 showed normal LV systolic function, mild mitral regurgitation and moderate biatrial enlargement. Patient had cardioversion November 10 2015 and February 21 2016. Note a consult has been arranged with Dr. Johney FrameAllred for flutter ablation. Since last seen, he has dyspnea on exertion but no orthopnea, PND, pedal edema, chest pain or syncope.  Current Outpatient Prescriptions  Medication Sig Dispense Refill  . apixaban (ELIQUIS) 5 MG TABS tablet Take 1 tablet (5 mg total) by mouth 2 (two) times daily. 60 tablet 11  . aspirin EC 81 MG tablet Take 1 tablet (81 mg total) by mouth daily.    [name]. atorvastatin (LIPITOR) 80 MG tablet Take 1 tablet (80 mg total) by mouth daily. 90 tablet 1  . buPROPion (WELLBUTRIN SR) 150 MG 12 hr tablet Take 150 mg by mouth 2 (two) times daily.     . fluticasone (FLONASE) 50 MCG/ACT nasal spray Place 2 sprays into both nostrils daily as needed for allergies.     [name]. gabapentin (NEURONTIN) 300 MG capsule Take 2 capsules (600 mg total) by mouth 2 (two) times daily. 360 capsule 1  . glipiZIDE (GLUCOTROL) 10 MG tablet Take 1 tablet (10 mg total) by mouth 2 (two) times  daily before a meal. 180 tablet 3  . glucose blood (ACCU-CHEK ACTIVE STRIPS) test strip Use as instructed 100 each 12  . Insulin Pen Needle (PEN NEEDLES 31GX5/16") 31G X 8 MM MISC Use to inject insulin 5 times a day. 100 each 11  . isosorbide mononitrate (IMDUR) 30 MG 24 hr tablet Take 1 tablet (30 mg total) by mouth daily. 30 tablet 12  . Lancets (ACCU-CHEK MULTICLIX) lancets Use up to 5 times a day to check blood sugar. 100 each 5  . metFORMIN (GLUCOPHAGE) 1000 MG tablet Take 1 tablet (1,000 mg total) by mouth 2 (two) times daily with a meal. 180 tablet 3  . Nebivolol HCl 20 MG TABS Take 1 tablet (20 mg total) by mouth daily. 90 tablet 1  . NOVOLOG FLEXPEN 100 UNIT/ML FlexPen USE 15 TO 30 UNITS 15 MINUTES BEFORE MEALS 3 TIMES DAILY 15 pen 0  . omeprazole (PRILOSEC) 20 MG capsule TAKE ONE CAPSULE TWICE A DAY BEFORE A MEAL 180 capsule 0  . TOUJEO SOLOSTAR 300 UNIT/ML SOPN INJECT 30 UNITS INTO THE SKIN AT BEDTIME. 4.5 mL 1  . valsartan-hydrochlorothiazide (DIOVAN-HCT) 80-12.5 MG tablet Take 1 tablet by mouth daily. 30 tablet 6  . furosemide (LASIX) 20 MG tablet Take 1 tablet (20 mg total) by mouth daily as needed for edema. (Patient not taking: Reported on 03/22/2016) 30 tablet 1   Current Facility-Administered Medications  Medication Dose Route Frequency Provider Last Rate Last Dose  . ipratropium-albuterol (DUONEB) 0.5-2.5 (3)  MG/3ML nebulizer solution 3 mL  3 mL Nebulization Q6H [name] Nafziger, NP      . pneumococcal 13-valent conjugate vaccine (PREVNAR 13) injection 0.5 mL  0.5 mL Intramuscular Tomorrow-1000 Shirline Frees, NP         Past Medical History:  Diagnosis Date  . Adjustment disorder with mixed emotional features   . Atrial flutter (HCC)   . CAD (coronary artery disease)   . Depression   . Diabetes mellitus type II, uncontrolled (HCC)   . Diabetic peripheral neuropathy (HCC)   . Erectile dysfunction   . GERD (gastroesophageal reflux disease)   . Herpes zoster   .  Hyperlipidemia   . Hypertension   . Obesity   . Osteoarthritis   . Postherpetic neuralgia   . Sleep apnea     Past Surgical History:  Procedure Laterality Date  . CARDIOVERSION N/A 11/10/2015   Procedure: CARDIOVERSION;  Surgeon: Laurey Morale, MD;  Location: [facility] ENDOSCOPY;  Service: Cardiovascular;  Laterality: N/A;  . CARDIOVERSION N/A 02/21/2016   Procedure: CARDIOVERSION;  Surgeon: Lewayne Bunting, MD;  Location: [facility] ENDOSCOPY;  Service: Cardiovascular;  Laterality: N/A;  . CORONARY ARTERY BYPASS GRAFT  1996   eith a LIMA to the LAD, saphenous vein graft to the acute marginal, saphenous vein graft to the PDA and saphenous vein graft to the circumflex.  [name] KNEE ARTHROSCOPY  05/2004   right knee  . REPLACEMENT TOTAL KNEE BILATERAL    . SHOULDER SURGERY      Social History   Social History  . Marital status: Married    Spouse name: [name]  . Number of children: 1  . Years of education: 76   Occupational History  . Retired Naval architect Retired   Social History Main Topics  . Smoking status: Never Smoker  . Smokeless tobacco: Never Used  . Alcohol use 1.2 oz/week    2 Standard drinks or equivalent per week     Comment: twice a month.  . Drug use: No  . Sexual activity: Yes    Partners: Female   Other Topics Concern  . Not on file   Social History Narrative   Married [name])   Regular exercise: none   Caffeine use: cup of coffee daily    Retired Naval architect   Caffeine- twice daily             Family History  Problem Relation Age of Onset  . Coronary artery disease Mother     deceased at 35  . Coronary artery disease Father     died age 80  . Stroke      half brother  . Stroke Brother     ROS: no fevers or chills, productive cough, hemoptysis, dysphasia, odynophagia, melena, hematochezia, dysuria, hematuria, rash, seizure activity, orthopnea, PND, pedal edema, claudication. Remaining systems are negative.  Physical Exam: Well-developed well-nourished  in no acute distress.  Skin is warm and dry.  HEENT is normal.  Neck is supple.  Chest is clear to auscultation with normal expansion.  Cardiovascular exam is regular rate and rhythm.  Abdominal exam nontender or distended. No masses palpated. Extremities show no edema. neuro grossly intact  ECG-sinus rhythm with first-degree AV block. Nonspecific ST changes.  A/P  1 atrial flutter-patient remains in sinus rhythm. He is scheduled to see Dr. Johney Frame for consideration of ablation. Continue apixaban and beta blocker.  2 coronary artery disease-continue statin and ASA.  3 hypertension-blood pressure controlled. Continue present medications.  4 hyperlipidemia-continue  statin.  5 dyspnea-etiology unclear. He is back in sinus rhythm and his symptoms are improved but persist. Not volume overloaded on examination. Continue medical therapy.     Olga MillersBrian Crenshaw, MD

## 2016-03-22 ENCOUNTER — Ambulatory Visit (INDEPENDENT_AMBULATORY_CARE_PROVIDER_SITE_OTHER): Payer: Medicare Other | Admitting: Cardiology

## 2016-03-22 ENCOUNTER — Ambulatory Visit: Payer: Medicare Other | Admitting: Adult Health

## 2016-03-22 ENCOUNTER — Encounter: Payer: Self-pay | Admitting: Cardiology

## 2016-03-22 VITALS — BP 118/70 | HR 80 | Ht 74.0 in | Wt 226.0 lb

## 2016-03-22 DIAGNOSIS — I2510 Atherosclerotic heart disease of native coronary artery without angina pectoris: Secondary | ICD-10-CM | POA: Diagnosis not present

## 2016-03-22 DIAGNOSIS — I481 Persistent atrial fibrillation: Secondary | ICD-10-CM | POA: Diagnosis not present

## 2016-03-22 DIAGNOSIS — I4819 Other persistent atrial fibrillation: Secondary | ICD-10-CM

## 2016-03-22 DIAGNOSIS — I4892 Unspecified atrial flutter: Secondary | ICD-10-CM

## 2016-03-22 NOTE — Patient Instructions (Signed)
Your physician recommends that you schedule a follow-up appointment in: 3 months with Dr.Crenshaw 

## 2016-03-27 ENCOUNTER — Ambulatory Visit (HOSPITAL_BASED_OUTPATIENT_CLINIC_OR_DEPARTMENT_OTHER): Payer: Medicare Other | Attending: Acute Care | Admitting: Pulmonary Disease

## 2016-03-27 VITALS — Ht 74.0 in | Wt 225.0 lb

## 2016-03-27 DIAGNOSIS — Z7984 Long term (current) use of oral hypoglycemic drugs: Secondary | ICD-10-CM | POA: Insufficient documentation

## 2016-03-27 DIAGNOSIS — Z79899 Other long term (current) drug therapy: Secondary | ICD-10-CM | POA: Diagnosis not present

## 2016-03-27 DIAGNOSIS — G4733 Obstructive sleep apnea (adult) (pediatric): Secondary | ICD-10-CM | POA: Diagnosis not present

## 2016-03-28 ENCOUNTER — Encounter: Payer: Self-pay | Admitting: Adult Health

## 2016-03-28 ENCOUNTER — Ambulatory Visit (INDEPENDENT_AMBULATORY_CARE_PROVIDER_SITE_OTHER): Payer: Medicare Other | Admitting: Adult Health

## 2016-03-28 DIAGNOSIS — E1140 Type 2 diabetes mellitus with diabetic neuropathy, unspecified: Secondary | ICD-10-CM | POA: Diagnosis not present

## 2016-03-28 DIAGNOSIS — R0602 Shortness of breath: Secondary | ICD-10-CM | POA: Diagnosis not present

## 2016-03-28 DIAGNOSIS — Z794 Long term (current) use of insulin: Secondary | ICD-10-CM

## 2016-03-28 DIAGNOSIS — I2510 Atherosclerotic heart disease of native coronary artery without angina pectoris: Secondary | ICD-10-CM | POA: Diagnosis not present

## 2016-03-28 DIAGNOSIS — E1165 Type 2 diabetes mellitus with hyperglycemia: Secondary | ICD-10-CM | POA: Diagnosis not present

## 2016-03-28 DIAGNOSIS — IMO0002 Reserved for concepts with insufficient information to code with codable children: Secondary | ICD-10-CM

## 2016-03-28 DIAGNOSIS — Z760 Encounter for issue of repeat prescription: Secondary | ICD-10-CM

## 2016-03-28 NOTE — Progress Notes (Signed)
Subjective:    Patient ID: Luis Yu, male    DOB: [birth year], 71 y.o.   MRN: [id number]  HPI  71 year old male who  has a past medical history of Adjustment disorder with mixed emotional features; Atrial flutter (HCC); CAD (coronary artery disease); Depression; Diabetes mellitus type II, uncontrolled (HCC); Diabetic peripheral neuropathy (HCC); Erectile dysfunction; GERD (gastroesophageal reflux disease); Herpes zoster; Hyperlipidemia; Hypertension; Obesity; Osteoarthritis; Postherpetic neuralgia; and Sleep apnea.   He presents to the office today with his wife for medication management. Insurance is changed in 2018 and he needs 90 day refills on all his medications as well as a change from Toujeo to [employer] insulin. His wife would like the prescriptions printed out.  He has follow-up appointments with cardiology for consult for possible ablation.   He has had a sleep study done recently and is to follow-up with pulmonology for new CPAP machine  He is using his walker more consistently, but continues to feel short of breath. His wife also reports that after the cardioversion done has lost some of his sense of taste and appetite..   Review of Systems  Constitutional: Positive for activity change, appetite change and fatigue.  HENT: Negative.   Eyes: Negative.   Respiratory: Positive for cough and shortness of breath.   Cardiovascular: Negative.   Gastrointestinal: Negative.   Endocrine: Negative.   Genitourinary: Negative.   Musculoskeletal: Negative.   Skin: Negative.   Neurological: Negative.   Hematological: Negative.   Psychiatric/Behavioral: Negative.   All other systems reviewed and are negative.  Past Medical History:  Diagnosis Date  . Adjustment disorder with mixed emotional features   . Atrial flutter (HCC)   . CAD (coronary artery disease)   . Depression   . Diabetes mellitus type II, uncontrolled (HCC)   . Diabetic peripheral neuropathy (HCC)   . Erectile  dysfunction   . GERD (gastroesophageal reflux disease)   . Herpes zoster   . Hyperlipidemia   . Hypertension   . Obesity   . Osteoarthritis   . Postherpetic neuralgia   . Sleep apnea     Social History   Social History  . Marital status: Married    Spouse name: [name]  . Number of children: 1  . Years of education: 8713   Occupational History  . Retired Naval architecttruck driver Retired   Social History Main Topics  . Smoking status: Never Smoker  . Smokeless tobacco: Never Used  . Alcohol use 1.2 oz/week    2 Standard drinks or equivalent per week     Comment: twice a month.  . Drug use: No  . Sexual activity: Yes    Partners: Female   Other Topics Concern  . Not on file   Social History Narrative   Married [name]([name])   Regular exercise: none   Caffeine use: cup of coffee daily    Retired Naval architecttruck driver   Caffeine- twice daily             Past Surgical History:  Procedure Laterality Date  . CARDIOVERSION N/A 11/10/2015   Procedure: CARDIOVERSION;  Surgeon: Laurey Moralealton S McLean, MD;  [facility];  Service: Cardiovascular;  Laterality: N/A;  . CARDIOVERSION N/A 02/21/2016   Procedure: CARDIOVERSION;  Surgeon: Lewayne BuntingBrian S Crenshaw, MD;  [facility];  Service: Cardiovascular;  Laterality: N/A;  . CORONARY ARTERY BYPASS GRAFT  1996   eith a LIMA to the LAD, saphenous vein graft to the acute marginal, saphenous vein graft to the PDA and  saphenous vein graft to the circumflex.  [name] KNEE ARTHROSCOPY  05/2004   right knee  . REPLACEMENT TOTAL KNEE BILATERAL    . SHOULDER SURGERY      Family History  Problem Relation Age of Onset  . Coronary artery disease Mother     deceased at 78  . Coronary artery disease Father     died age 47  . Stroke      half brother  . Stroke Brother     No Known Allergies  Current Outpatient Prescriptions on File Prior to Visit  Medication Sig Dispense Refill  . apixaban (ELIQUIS) 5 MG TABS tablet Take 1 tablet (5 mg total) by mouth 2  (two) times daily. 60 tablet 11  . aspirin EC 81 MG tablet Take 1 tablet (81 mg total) by mouth daily.    [name] atorvastatin (LIPITOR) 80 MG tablet Take 1 tablet (80 mg total) by mouth daily. 90 tablet 1  . buPROPion (WELLBUTRIN SR) 150 MG 12 hr tablet Take 150 mg by mouth 2 (two) times daily.     . fluticasone (FLONASE) 50 MCG/ACT nasal spray Place 2 sprays into both nostrils daily as needed for allergies.     . furosemide (LASIX) 20 MG tablet Take 1 tablet (20 mg total) by mouth daily as needed for edema. 30 tablet 1  . gabapentin (NEURONTIN) 300 MG capsule Take 2 capsules (600 mg total) by mouth 2 (two) times daily. 360 capsule 1  . glipiZIDE (GLUCOTROL) 10 MG tablet Take 1 tablet (10 mg total) by mouth 2 (two) times daily before a meal. 180 tablet 3  . glucose blood (ACCU-CHEK ACTIVE STRIPS) test strip Use as instructed 100 each 12  . Insulin Pen Needle (PEN NEEDLES 31GX5/16") 31G X 8 MM MISC Use to inject insulin 5 times a day. 100 each 11  . isosorbide mononitrate (IMDUR) 30 MG 24 hr tablet Take 1 tablet (30 mg total) by mouth daily. 30 tablet 12  . Lancets (ACCU-CHEK MULTICLIX) lancets Use up to 5 times a day to check blood sugar. 100 each 5  . metFORMIN (GLUCOPHAGE) 1000 MG tablet Take 1 tablet (1,000 mg total) by mouth 2 (two) times daily with a meal. 180 tablet 3  . Nebivolol HCl 20 MG TABS Take 1 tablet (20 mg total) by mouth daily. 90 tablet 1  . NOVOLOG FLEXPEN 100 UNIT/ML FlexPen USE 15 TO 30 UNITS 15 MINUTES BEFORE MEALS 3 TIMES DAILY 15 pen 0  . omeprazole (PRILOSEC) 20 MG capsule TAKE ONE CAPSULE TWICE A DAY BEFORE A MEAL 180 capsule 0  . TOUJEO SOLOSTAR 300 UNIT/ML SOPN INJECT 30 UNITS INTO THE SKIN AT BEDTIME. 4.5 mL 1  . valsartan-hydrochlorothiazide (DIOVAN-HCT) 80-12.5 MG tablet Take 1 tablet by mouth daily. 30 tablet 6   Current Facility-Administered Medications on File Prior to Visit  Medication Dose Route Frequency Provider Last Rate Last Dose  . ipratropium-albuterol  (DUONEB) 0.5-2.5 (3) MG/3ML nebulizer solution 3 mL  3 mL Nebulization Q6H Judye Lorino, NP      . pneumococcal 13-valent conjugate vaccine (PREVNAR 13) injection 0.5 mL  0.5 mL Intramuscular Tomorrow-1000 Valentino Saavedra, NP        BP 126/60   Temp 98.2 F (36.8 C) (Oral)   Ht 6\' 2"  (1.88 m)   Wt 226 lb 6.4 oz (102.7 kg)   BMI 29.07 kg/m       Objective:   Physical Exam  Constitutional: He is oriented to person, place, and time. He  appears well-developed and well-nourished. No distress.  HENT:  Head: Normocephalic and atraumatic.  Right Ear: External ear normal.  Left Ear: External ear normal.  Nose: Nose normal.  Mouth/Throat: Oropharynx is clear and moist. No oropharyngeal exudate.  Cardiovascular: Normal rate, regular rhythm, normal heart sounds and intact distal pulses.  Exam reveals no gallop and no friction rub.   No murmur heard. Pulmonary/Chest: Effort normal and breath sounds normal. No respiratory distress. He has no wheezes. He has no rales. He exhibits no tenderness.  Musculoskeletal: Normal range of motion. He exhibits no edema, tenderness or deformity.  Neurological: He is alert and oriented to person, place, and time.  Skin: Skin is warm and dry. No rash noted. He is not diaphoretic. No erythema. No pallor.  Psychiatric: He has a normal mood and affect. His behavior is normal. Judgment and thought content normal.  Nursing note and vitals reviewed.     Assessment & Plan:  1. Uncontrolled type 2 diabetes mellitus with diabetic neuropathy, with long-term current use of insulin (HCC)  - glipiZIDE (GLUCOTROL) 10 MG tablet; Take 1 tablet (10 mg total) by mouth 2 (two) times daily before a meal.  Dispense: 180 tablet; Refill: 3 - metFORMIN (GLUCOPHAGE) 1000 MG tablet; Take 1 tablet (1,000 mg total) by mouth 2 (two) times daily with a meal.  Dispense: 180 tablet; Refill: 3 - Follow up in one month for repeat A1c - Work on diabetic diet and regular exercise, as tolerated.    2. Shortness of breath  - Nebivolol HCl 20 MG TABS; Take 1 tablet (20 mg total) by mouth daily.  Dispense: 90 tablet; Refill: 3  3. Medication refill  - atorvastatin (LIPITOR) 80 MG tablet; Take 1 tablet (80 mg total) by mouth daily.  Dispense: 90 tablet; Refill: 3 - buPROPion (WELLBUTRIN SR) 150 MG 12 hr tablet; Take 1 tablet (150 mg total) by mouth 2 (two) times daily.  Dispense: 180 tablet; Refill: 1 - fluticasone (FLONASE) 50 MCG/ACT nasal spray; Place 2 sprays into both nostrils daily as needed for allergies.  Dispense: 16 g; Refill: 6 - glipiZIDE (GLUCOTROL) 10 MG tablet; Take 1 tablet (10 mg total) by mouth 2 (two) times daily before a meal.  Dispense: 180 tablet; Refill: 3 - glucose blood (ACCU-CHEK ACTIVE STRIPS) test strip; Use as instructed  Dispense: 100 each; Refill: 12 - insulin aspart (NOVOLOG FLEXPEN) 100 UNIT/ML FlexPen; USE 15 TO 30 UNITS 15 MINUTES BEFORE MEALS 3 TIMES DAILY  Dispense: 15 pen; Refill: 6 - Insulin Pen Needle (PEN NEEDLES 31GX5/16") 31G X 8 MM MISC; Use to inject insulin 5 times a day.  Dispense: 100 each; Refill: 11 - Lancets (ACCU-CHEK MULTICLIX) lancets; Use up to 5 times a day to check blood sugar.  Dispense: 100 each; Refill: 5 - metFORMIN (GLUCOPHAGE) 1000 MG tablet; Take 1 tablet (1,000 mg total) by mouth 2 (two) times daily with a meal.  Dispense: 180 tablet; Refill: 3 - omeprazole (PRILOSEC) 20 MG capsule; TAKE ONE CAPSULE TWICE A DAY BEFORE A MEAL  Dispense: 180 capsule; Refill: 3 - valsartan-hydrochlorothiazide (DIOVAN-HCT) 80-12.5 MG tablet; Take 1 tablet by mouth daily.  Dispense: 90 tablet; Refill: 3 - Nebivolol HCl 20 MG TABS; Take 1 tablet (20 mg total) by mouth daily.  Dispense: 90 tablet; Refill: 3 - Insulin Glargine (BASAGLAR KWIKPEN) 100 UNIT/ML SOPN; Inject 0.2 mLs (20 Units total) into the skin at bedtime.  Dispense: 3 pen; Refill: 11   Shirline Frees, NP

## 2016-03-29 ENCOUNTER — Encounter (HOSPITAL_BASED_OUTPATIENT_CLINIC_OR_DEPARTMENT_OTHER): Payer: Medicare Other

## 2016-03-29 MED ORDER — OMEPRAZOLE 20 MG PO CPDR: DELAYED_RELEASE_CAPSULE | ORAL | 3 refills | Status: DC

## 2016-03-29 MED ORDER — INSULIN ASPART 100 UNIT/ML FLEXPEN: PEN_INJECTOR | SUBCUTANEOUS | 6 refills | Status: DC

## 2016-03-29 MED ORDER — ACCU-CHEK MULTICLIX LANCETS MISC: 5 refills | Status: DC

## 2016-03-29 MED ORDER — FLUTICASONE PROPIONATE 50 MCG/ACT NA SUSP: 2.0000 | Freq: Every day | NASAL | 6 refills | Status: DC | PRN

## 2016-03-29 MED ORDER — GLUCOSE BLOOD VI STRP: ORAL_STRIP | 12 refills | Status: DC

## 2016-03-29 MED ORDER — BASAGLAR KWIKPEN 100 UNIT/ML ~~LOC~~ SOPN: 20.0000 [IU] | PEN_INJECTOR | Freq: Every day | SUBCUTANEOUS | 11 refills | Status: DC

## 2016-03-29 MED ORDER — NEBIVOLOL HCL 20 MG PO TABS: 20.0000 mg | ORAL_TABLET | Freq: Every day | ORAL | 3 refills | Status: DC

## 2016-03-29 MED ORDER — METFORMIN HCL 1000 MG PO TABS: 1000.0000 mg | ORAL_TABLET | Freq: Two times a day (BID) | ORAL | 3 refills | Status: DC

## 2016-03-29 MED ORDER — GLIPIZIDE 10 MG PO TABS: 10.0000 mg | ORAL_TABLET | Freq: Two times a day (BID) | ORAL | 3 refills | Status: DC

## 2016-03-29 MED ORDER — ATORVASTATIN CALCIUM 80 MG PO TABS: 80.0000 mg | ORAL_TABLET | Freq: Every day | ORAL | 3 refills | Status: DC

## 2016-03-29 MED ORDER — "PEN NEEDLES 5/16"" 31G X 8 MM MISC": 11 refills | Status: DC

## 2016-03-29 MED ORDER — VALSARTAN-HYDROCHLOROTHIAZIDE 80-12.5 MG PO TABS: 1.0000 | ORAL_TABLET | Freq: Every day | ORAL | 3 refills | Status: DC

## 2016-03-29 MED ORDER — BUPROPION HCL ER (SR) 150 MG PO TB12: 150.0000 mg | ORAL_TABLET | Freq: Two times a day (BID) | ORAL | 1 refills | Status: DC

## 2016-04-02 ENCOUNTER — Other Ambulatory Visit: Payer: Self-pay | Admitting: Adult Health

## 2016-04-03 ENCOUNTER — Ambulatory Visit: Payer: Medicare Other | Admitting: Neurology

## 2016-04-03 ENCOUNTER — Other Ambulatory Visit (HOSPITAL_BASED_OUTPATIENT_CLINIC_OR_DEPARTMENT_OTHER): Payer: Self-pay

## 2016-04-03 DIAGNOSIS — G4731 Primary central sleep apnea: Secondary | ICD-10-CM

## 2016-04-04 ENCOUNTER — Institutional Professional Consult (permissible substitution): Payer: Medicare Other | Admitting: Internal Medicine

## 2016-04-11 ENCOUNTER — Other Ambulatory Visit: Payer: Self-pay | Admitting: Adult Health

## 2016-04-11 ENCOUNTER — Telehealth: Payer: Self-pay | Admitting: Pulmonary Disease

## 2016-04-11 DIAGNOSIS — Z760 Encounter for issue of repeat prescription: Secondary | ICD-10-CM

## 2016-04-11 NOTE — Procedures (Signed)
Patient Name: Luis Yu, Luis Yu Study Date: 03/27/2016 Gender: Male D.O.B: [birth year] Age (years): 89 Referring Provider: Cyril Mourningakesh Alva MD, ABSM Height (inches): 74 Interpreting Physician: Cyril Mourningakesh Alva MD, ABSM Weight (lbs): 237 [place]: [place] BMI: 30 MRN: [id number] Neck Size: 16.00   CLINICAL INFORMATION The patient is referred for a adaptive servo-ventilator titration study. Most recent polysomnogram dated 02/12/2016 revealed an AHI of 47.5/h. Most recent titration study dated 02/12/2016 showed BIpap requirement of 15/11cm H2O with persistent centrals . This was also noted on numerous downloads   MEDICATIONS Medications self-administered by patient taken the night of the study : GABAPENTIN, GLIPIZIDE, METFORMIN, LIPITOR, ELIQUIS, BYSTOLIC, WELBUTRIN  SLEEP STUDY TECHNIQUE As per the AASM Manual for the Scoring of Sleep and Associated Events v2.3 (April 2016) with a hypopnea requiring 4% desaturations.  The channels recorded and monitored were frontal, central and occipital EEG, electrooculogram (EOG), submentalis EMG (chin), nasal and oral airflow, thoracic and abdominal wall motion, anterior tibialis EMG, snore microphone, electrocardiogram, and pulse oximetry.  RESPIRATORY PARAMETERS Optimal Min IPAP (cm): 4 Optimal Max IPAP (cm): 7 Optimal Min EPAP (cm): 4 Optimal Max EPAP (cm): 7 Optimal Max Pressure (cm): 15 Optimal Min PS (cm): 4 Optimal Max PS (cm): 15 Opitmal Breathing Rate (/min): Auto Overall Min O2 (%): 93.00 Min O2 at Optimal Pressure (%): 92.00 AHI at Optimal (/hr): N/A     SLEEP ARCHITECTURE During a recording time of 358.7 minutes, the patient slept for 161.8 minutes. Sleep efficiency was 45.1%. The patient spent 30.91% of the night in stage N1 sleep, 56.11% in stage N2 sleep, 0.00% in stage N3 and 12.98% in REM. Wake after sleep onset (WASO) was 197.0 minutes. Alpha intrusion was  absent. Supine sleep was 99.96%. The arousal index was 27.8.  LEG MOVEMENT  DATA PLM Index (/hr): 82 PLM Arousal Index (/hr): 5.9   CARDIAC DATA The 2 lead EKG demonstrated sinus rhythm. The mean heart rate was 61.63 beats per minute. Other EKG findings include: None.    IMPRESSIONS - No significant obstructive sleep apnea occurred during this study (AHI = 2.2/hour). An optimal ASV pressure was selected. - No significant central sleep apnea occurred during this study (CAI = 0.0). - The patient had minimal or no oxygen desaturation during the study (Min O2 = 93.00) - The patient snored with Soft snoring volume. - No cardiac abnormalities were noted during this study. - Severe periodic limb movements of sleep occurred during the study. - Difficulty maintaining sleep during this study    DIAGNOSIS - Obstructive Sleep Apnea (327.23 [G47.33 ICD-10])    RECOMMENDATIONS - Trial of SV Advance EPAP 7 cmH2O, Pressure Support Min 4 and Max 15 cmH2O with Max Pressure of 15 cmH2O  - Investigate for restless legs syndrome - Avoid alcohol, sedatives and other CNS depressants that may worsen sleep apnea and disrupt normal sleep architecture. - Sleep hygiene should be reviewed to assess factors that may improve sleep quality. - Weight management and regular exercise should be initiated or continued. - Return to [facility] Center for re-evaluation after 4 weeks of therapy    Cyril Mourningakesh Alva MD Board Certified in Sleep medicine

## 2016-04-11 NOTE — Telephone Encounter (Signed)
Pl send Rx to DME for Dc CPAP/ BiPAP  ASV  EPAP 7 cmH2O, Pressure Support Min 4 and Max 15 cmH2O with Max Pressure of 15 cmH2O , back up RR 12 downloa din 4 wks Arrange FU in 6 wks   This machine seemed to help better,although I noted that he did not sleep much that night

## 2016-04-11 NOTE — Telephone Encounter (Signed)
Okay to refill for 1 year. 

## 2016-04-12 ENCOUNTER — Other Ambulatory Visit: Payer: Self-pay

## 2016-04-12 DIAGNOSIS — G4733 Obstructive sleep apnea (adult) (pediatric): Secondary | ICD-10-CM

## 2016-04-12 NOTE — Telephone Encounter (Signed)
Spoke with patients wife. She verbalized understanding about the new machine. She states the patient still wants to keep the 04/24/16 appt with Dr. Vassie LollAlva. Nothing else needed.

## 2016-04-13 ENCOUNTER — Telehealth: Payer: Self-pay | Admitting: Cardiology

## 2016-04-13 ENCOUNTER — Telehealth: Payer: Self-pay | Admitting: Pulmonary Disease

## 2016-04-13 NOTE — Telephone Encounter (Signed)
New message    Patient calling the office for samples of medication:   1.  What medication and dosage are you requesting samples for? Eliquis 5mg - diastolic 20mg   2.  Are you currently out of this medication? 1 week

## 2016-04-13 NOTE — Telephone Encounter (Signed)
It is usually a few days or more and they may not have it to process yet if it was yesterday tell her she should hear from [facility] by next wed if not call us back Tobe SosSally E Ottinger

## 2016-04-13 NOTE — Telephone Encounter (Signed)
Patient aware samples are at the front desk for pick up  

## 2016-04-13 NOTE — Telephone Encounter (Signed)
Pt's wife advised message from [place]. She verbalized understanding and will look out for their call.

## 2016-04-13 NOTE — Telephone Encounter (Signed)
PCC's  Pt. wifed called checking on the status of the order for her husband's bipap machine. I informed her that the order was placed yesterday, she states she called [facility] and they stated they have no order

## 2016-04-16 ENCOUNTER — Ambulatory Visit (INDEPENDENT_AMBULATORY_CARE_PROVIDER_SITE_OTHER): Payer: Medicare Other | Admitting: Internal Medicine

## 2016-04-16 ENCOUNTER — Encounter: Payer: Self-pay | Admitting: Internal Medicine

## 2016-04-16 VITALS — BP 124/80 | HR 80 | Ht 74.0 in | Wt 227.6 lb

## 2016-04-16 DIAGNOSIS — I4819 Other persistent atrial fibrillation: Secondary | ICD-10-CM

## 2016-04-16 DIAGNOSIS — I483 Typical atrial flutter: Secondary | ICD-10-CM | POA: Diagnosis not present

## 2016-04-16 DIAGNOSIS — I10 Essential (primary) hypertension: Secondary | ICD-10-CM

## 2016-04-16 DIAGNOSIS — I481 Persistent atrial fibrillation: Secondary | ICD-10-CM

## 2016-04-16 DIAGNOSIS — Z9989 Dependence on other enabling machines and devices: Secondary | ICD-10-CM

## 2016-04-16 DIAGNOSIS — G4733 Obstructive sleep apnea (adult) (pediatric): Secondary | ICD-10-CM | POA: Diagnosis not present

## 2016-04-16 NOTE — Patient Instructions (Signed)
Medication Instructions:  Your physician recommends that you continue on your current medications as directed. Please refer to the Current Medication list given to you today.   Labwork: None ordered   Testing/Procedures: None ordered   Follow-Up: Your physician recommends that you schedule a follow-up appointment in: 3 weeks with Rudi Cocoonna Carroll, NP in afib clinic   Any Other Special Instructions Will Be Listed Below (If Applicable).     If you need a refill on your cardiac medications before your next appointment, please call your pharmacy.

## 2016-04-16 NOTE — Progress Notes (Signed)
Electrophysiology Office Note   Date:  04/16/2016   ID:  Luis Yu 02-Apr-1945, MRN [id number]  PCP:  Shirline Frees, NP  Cardiologist:  Dr Jens Som  CC: SOB of unclear etiology   History of Present Illness: Luis Yu is a 71 y.o. male who presents today for electrophysiology evaluation.   The patient has chronic profound dyspnea of unclear etiology.  He is being evaluated by cardiology and pulmonary teams.  No clear cause has been found.  He did have typical appearing rate controlled atrial flutter in early December for which he was cardioverted.   Today, he is in afib.  Recently he has significant SOB when seeing Dr Jens Som and was in sinus rhythm at that time.  Today, he denies symptoms of palpitations, chest pain, shortness of breath, orthopnea, PND, lower extremity edema, claudication, dizziness, presyncope, syncope, bleeding, or neurologic sequela. The patient is tolerating medications without difficulties and is otherwise without complaint today.    Past Medical History:  Diagnosis Date  . Adjustment disorder with mixed emotional features   . Atrial flutter (HCC)   . CAD (coronary artery disease)   . Depression   . Diabetes mellitus type II, uncontrolled (HCC)   . Diabetic peripheral neuropathy (HCC)   . Erectile dysfunction   . GERD (gastroesophageal reflux disease)   . Herpes zoster   . Hyperlipidemia   . Hypertension   . Obesity   . Osteoarthritis   . Postherpetic neuralgia   . Sleep apnea    Past Surgical History:  Procedure Laterality Date  . CARDIOVERSION N/A 11/10/2015   Procedure: CARDIOVERSION;  Surgeon: Laurey Morale, MD;  Location: [facility] ENDOSCOPY;  Service: Cardiovascular;  Laterality: N/A;  . CARDIOVERSION N/A 02/21/2016   Procedure: CARDIOVERSION;  Surgeon: Lewayne Bunting, MD;  Location: [facility] ENDOSCOPY;  Service: Cardiovascular;  Laterality: N/A;  . CORONARY ARTERY BYPASS GRAFT  1996   eith a LIMA to the LAD, saphenous vein graft to the acute  marginal, saphenous vein graft to the PDA and saphenous vein graft to the circumflex.  [name] KNEE ARTHROSCOPY  05/2004   right knee  . REPLACEMENT TOTAL KNEE BILATERAL    . SHOULDER SURGERY       Current Outpatient Prescriptions  Medication Sig Dispense Refill  . apixaban (ELIQUIS) 5 MG TABS tablet Take 1 tablet (5 mg total) by mouth 2 (two) times daily. 60 tablet 11  . aspirin EC 81 MG tablet Take 1 tablet (81 mg total) by mouth daily.    [name] atorvastatin (LIPITOR) 80 MG tablet Take 1 tablet (80 mg total) by mouth daily. 90 tablet 3  . atorvastatin (LIPITOR) 80 MG tablet TAKE 1 TABLET (80 MG TOTAL) BY MOUTH DAILY. 90 tablet 3  . buPROPion (WELLBUTRIN SR) 150 MG 12 hr tablet Take 1 tablet (150 mg total) by mouth 2 (two) times daily. 180 tablet 1  . glipiZIDE (GLUCOTROL) 10 MG tablet Take 1 tablet (10 mg total) by mouth 2 (two) times daily before a meal. 180 tablet 3  . glucose blood (ACCU-CHEK ACTIVE STRIPS) test strip Use as instructed 100 each 12  . insulin aspart (NOVOLOG FLEXPEN) 100 UNIT/ML FlexPen USE 15 TO 30 UNITS 15 MINUTES BEFORE MEALS 3 TIMES DAILY 15 pen 6  . Insulin Glargine (BASAGLAR KWIKPEN) 100 UNIT/ML SOPN Inject 0.2 mLs (20 Units total) into the skin at bedtime. 3 pen 11  . Insulin Pen Needle (PEN NEEDLES 31GX5/16") 31G X 8 MM MISC Use to inject insulin  5 times a day. 100 each 11  . isosorbide mononitrate (IMDUR) 30 MG 24 hr tablet Take 1 tablet (30 mg total) by mouth daily. 30 tablet 12  . Lancets (ACCU-CHEK MULTICLIX) lancets Use up to 5 times a day to check blood sugar. 100 each 5  . metFORMIN (GLUCOPHAGE) 1000 MG tablet Take 1 tablet (1,000 mg total) by mouth 2 (two) times daily with a meal. 180 tablet 3  . Nebivolol HCl 20 MG TABS Take 1 tablet (20 mg total) by mouth daily. 90 tablet 3  . omeprazole (PRILOSEC) 20 MG capsule TAKE ONE CAPSULE TWICE A DAY BEFORE A MEAL 180 capsule 3  . omeprazole (PRILOSEC) 20 MG capsule TAKE ONE CAPSULE TWICE A DAY BEFORE A MEAL 180 capsule 0    . valsartan-hydrochlorothiazide (DIOVAN-HCT) 80-12.5 MG tablet Take 1 tablet by mouth daily. 90 tablet 3   Current Facility-Administered Medications  Medication Dose Route Frequency Provider Last Rate Last Dose  . pneumococcal 13-valent conjugate vaccine (PREVNAR 13) injection 0.5 mL  0.5 mL Intramuscular Tomorrow-1000 Shirline Freesory Nafziger, NP        Allergies:   Patient has no known allergies.   Social History:  The patient  reports that he has never smoked. He has never used smokeless tobacco. He reports that he drinks about 1.2 oz of alcohol per week . He reports that he does not use drugs.   Family History:  The patient's  family history includes Coronary artery disease in his father and mother; Stroke in his brother.    ROS:  Please see the history of present illness.   All other systems are reviewed and negative.    PHYSICAL EXAM: VS:  BP 124/80   Pulse 80   Ht 6\' 2"  (1.88 m)   Wt 227 lb 9.6 oz (103.2 kg)   BMI 29.22 kg/m  , BMI Body mass index is 29.22 kg/m. GEN: chronically ill, in no acute distress  HEENT: normal  Neck: no JVD, carotid bruits, or masses Cardiac: iRRR; no murmurs, rubs, or gallops,no edema  Respiratory:  clear to auscultation bilaterally, normal work of breathing GI: soft, nontender, nondistended, + BS MS: no deformity or atrophy  Skin: warm and dry  Neuro:  Strength and sensation are intact Psych: euthymic mood, full affect  EKG:  EKG is ordered today. The ekg ordered today shows afib, V rate 67 bpm   Recent Labs: 10/14/2015: TSH 1.15 11/01/2015: Brain Natriuretic Peptide 195.2 02/15/2016: ALT 17; BUN 23; Creatinine, Ser 1.15; Hemoglobin 11.0; Platelets 151.0; Potassium 4.2; Pro B Natriuretic peptide (BNP) 247.0; Sodium 139    Lipid Panel     Component Value Date/Time   CHOL 142 05/13/2015 1708   TRIG 77 05/13/2015 1708   HDL 54 05/13/2015 1708   CHOLHDL 2.6 05/13/2015 1708   VLDL 15 05/13/2015 1708   LDLCALC 73 05/13/2015 1708     Wt  Readings from Last 3 Encounters:  04/16/16 227 lb 9.6 oz (103.2 kg)  03/28/16 226 lb 6.4 oz (102.7 kg)  03/27/16 225 lb (102.1 kg)      Other studies Reviewed: Additional studies/ records that were reviewed today include: AF clinic notes, Dr Ludwig Clarksrenshaw's notes, Dr Thurston HoleWert's notes  Review of the above records today demonstrates: as above   ASSESSMENT AND PLAN:  1.  Afib/ atrial flutter The patient has atrial arrhythmias of unclear significance.  He is rate controlled.  He has severe LA enlargement.  I suspect that our long term ability to maintain sinus  is low.  I am not convinced that his SOB is related to atrial arrhythmias.  I agree with Dr Thurston Hole concerns for a pulmonary cause.  Workup is ongoing.  He will see Dr Vassie Loll soon. I would recommend anticoagulation and rate control for now.  He is not a candidate for ablation. Could consider tikosyn, however I think that further pulmonary testing to determine the cause for his dyspnea is required first.  Given SOB of unclear cause, we should avoid amiodarone long term.  2. dypsnea As above Follow-up with pulmonary  3. Sleep apnea Followed by Dr Vassie Loll The importance of sleep apnea treatment in order to control his arrhythmias was discussed today  Follow-up with Dr Jens Som as scheduled Follow-up with Drs Alva/ Sherene Sires as scheduled  Return to AF clinic in 3 weeks to discuss tikosyn as an option pending results of testing with Dr Vassie Loll.  Current medicines are reviewed at length with the patient today.   The patient does not have concerns regarding his medicines.  The following changes were made today:  none  Signed, Hillis Range, MD  04/16/2016 2:58 PM     [facility] [address] [place] [address] [phone] (office) [phone] (fax)

## 2016-04-18 ENCOUNTER — Encounter: Payer: Self-pay | Admitting: Pulmonary Disease

## 2016-04-24 ENCOUNTER — Ambulatory Visit (INDEPENDENT_AMBULATORY_CARE_PROVIDER_SITE_OTHER): Payer: Medicare Other | Admitting: Adult Health

## 2016-04-24 ENCOUNTER — Telehealth: Payer: Self-pay | Admitting: Pulmonary Disease

## 2016-04-24 ENCOUNTER — Encounter: Payer: Self-pay | Admitting: Pulmonary Disease

## 2016-04-24 ENCOUNTER — Encounter: Payer: Self-pay | Admitting: Adult Health

## 2016-04-24 ENCOUNTER — Ambulatory Visit (INDEPENDENT_AMBULATORY_CARE_PROVIDER_SITE_OTHER): Payer: Medicare Other | Admitting: Pulmonary Disease

## 2016-04-24 VITALS — BP 132/70 | Wt 232.6 lb

## 2016-04-24 VITALS — BP 142/88 | HR 72 | Ht 74.0 in | Wt 227.0 lb

## 2016-04-24 DIAGNOSIS — G4733 Obstructive sleep apnea (adult) (pediatric): Secondary | ICD-10-CM

## 2016-04-24 DIAGNOSIS — E1165 Type 2 diabetes mellitus with hyperglycemia: Secondary | ICD-10-CM

## 2016-04-24 DIAGNOSIS — Z794 Long term (current) use of insulin: Secondary | ICD-10-CM | POA: Diagnosis not present

## 2016-04-24 DIAGNOSIS — R0609 Other forms of dyspnea: Secondary | ICD-10-CM | POA: Diagnosis not present

## 2016-04-24 DIAGNOSIS — E1140 Type 2 diabetes mellitus with diabetic neuropathy, unspecified: Secondary | ICD-10-CM

## 2016-04-24 DIAGNOSIS — I2510 Atherosclerotic heart disease of native coronary artery without angina pectoris: Secondary | ICD-10-CM

## 2016-04-24 DIAGNOSIS — IMO0002 Reserved for concepts with insufficient information to code with codable children: Secondary | ICD-10-CM

## 2016-04-24 LAB — POCT GLYCOSYLATED HEMOGLOBIN (HGB A1C): Hemoglobin A1C: 6.9

## 2016-04-24 NOTE — Telephone Encounter (Signed)
RA please advise at pt's OV today. Thanks.

## 2016-04-24 NOTE — Assessment & Plan Note (Signed)
He has been started on adaptive servo ventilation.  We will confirm correct settings from [facility] and recheck download in one month He seems to be more compliant and feels better however limited download shows centrals are persistent. He had much better results during the titration study.  Weight loss encouraged, compliance with goal of at least 4-6 hrs every night is the expectation. Advised against medications with sedative side effects Cautioned against driving when sleepy - understanding that sleepiness will vary on a day to day basis

## 2016-04-24 NOTE — Progress Notes (Signed)
Subjective:    Patient ID: Luis Yu, male    DOB: 1946/02/15, 71 y.o.   MRN: [id number]  HPI   71 year old male who  has a past medical history of Adjustment disorder with mixed emotional features; Atrial flutter (HCC); CAD (coronary artery disease); Depression; Diabetes mellitus type II, uncontrolled (HCC); Diabetic peripheral neuropathy (HCC); Erectile dysfunction; GERD (gastroesophageal reflux disease); Herpes zoster; Hyperlipidemia; Hypertension; Obesity; Osteoarthritis; Postherpetic neuralgia; and Sleep apnea.   He presents to the office today for three month follow up regarding diabetes. For his management he is taking Glipizide 10 mg in the morning, Metformin 1000mg  BID, Novolog, and Basaglar.   He is eating better   His blood sugars have been better controlled, usually being no higher than 150.   His las A1c was 7.0    Review of Systems  Constitutional: Negative.   HENT: Negative.   Respiratory: Positive for shortness of breath (chronic ).   Cardiovascular: Negative.   Gastrointestinal: Negative.   Neurological: Negative.   All other systems reviewed and are negative.  Past Medical History:  Diagnosis Date  . Adjustment disorder with mixed emotional features   . Atrial flutter (HCC)   . CAD (coronary artery disease)   . Depression   . Diabetes mellitus type II, uncontrolled (HCC)   . Diabetic peripheral neuropathy (HCC)   . Erectile dysfunction   . GERD (gastroesophageal reflux disease)   . Herpes zoster   . Hyperlipidemia   . Hypertension   . Obesity   . Osteoarthritis   . Postherpetic neuralgia   . Sleep apnea     Social History   Social History  . Marital status: Married    Spouse name: [name]  . Number of children: 1  . Years of education: 8913   Occupational History  . Retired Naval architecttruck driver Retired   Social History Main Topics  . Smoking status: Never Smoker  . Smokeless tobacco: Never Used  . Alcohol use 1.2 oz/week    2 Standard drinks or  equivalent per week     Comment: twice a month.  . Drug use: No  . Sexual activity: Yes    Partners: Female   Other Topics Concern  . Not on file   Social History Narrative   Married [name]([name])   Regular exercise: none   Caffeine use: cup of coffee daily    Retired Naval architecttruck driver   Caffeine- twice daily             Past Surgical History:  Procedure Laterality Date  . CARDIOVERSION N/A 11/10/2015   Procedure: CARDIOVERSION;  Surgeon: Laurey Moralealton S McLean, MD;  [facility];  Service: Cardiovascular;  Laterality: N/A;  . CARDIOVERSION N/A 02/21/2016   Procedure: CARDIOVERSION;  Surgeon: Lewayne BuntingBrian S Crenshaw, MD;  [facility];  Service: Cardiovascular;  Laterality: N/A;  . CORONARY ARTERY BYPASS GRAFT  1996   eith a LIMA to the LAD, saphenous vein graft to the acute marginal, saphenous vein graft to the PDA and saphenous vein graft to the circumflex.  [name]. KNEE ARTHROSCOPY  05/2004   right knee  . REPLACEMENT TOTAL KNEE BILATERAL    . SHOULDER SURGERY      Family History  Problem Relation Age of Onset  . Coronary artery disease Mother     deceased at 5675  . Coronary artery disease Father     died age 72  . Stroke      half brother  . Stroke Brother  No Known Allergies  Current Outpatient Prescriptions on File Prior to Visit  Medication Sig Dispense Refill  . apixaban (ELIQUIS) 5 MG TABS tablet Take 1 tablet (5 mg total) by mouth 2 (two) times daily. 60 tablet 11  . aspirin EC 81 MG tablet Take 1 tablet (81 mg total) by mouth daily.    [name] atorvastatin (LIPITOR) 80 MG tablet TAKE 1 TABLET (80 MG TOTAL) BY MOUTH DAILY. 90 tablet 3  . buPROPion (WELLBUTRIN SR) 150 MG 12 hr tablet Take 1 tablet (150 mg total) by mouth 2 (two) times daily. 180 tablet 1  . glipiZIDE (GLUCOTROL) 10 MG tablet Take 1 tablet (10 mg total) by mouth 2 (two) times daily before a meal. 180 tablet 3  . glucose blood (ACCU-CHEK ACTIVE STRIPS) test strip Use as instructed 100 each 12  . insulin  aspart (NOVOLOG FLEXPEN) 100 UNIT/ML FlexPen USE 15 TO 30 UNITS 15 MINUTES BEFORE MEALS 3 TIMES DAILY 15 pen 6  . Insulin Glargine (BASAGLAR KWIKPEN) 100 UNIT/ML SOPN Inject 0.2 mLs (20 Units total) into the skin at bedtime. 3 pen 11  . Insulin Pen Needle (PEN NEEDLES 31GX5/16") 31G X 8 MM MISC Use to inject insulin 5 times a day. 100 each 11  . isosorbide mononitrate (IMDUR) 30 MG 24 hr tablet Take 1 tablet (30 mg total) by mouth daily. 30 tablet 12  . Lancets (ACCU-CHEK MULTICLIX) lancets Use up to 5 times a day to check blood sugar. 100 each 5  . metFORMIN (GLUCOPHAGE) 1000 MG tablet Take 1 tablet (1,000 mg total) by mouth 2 (two) times daily with a meal. 180 tablet 3  . Nebivolol HCl 20 MG TABS Take 1 tablet (20 mg total) by mouth daily. 90 tablet 3  . omeprazole (PRILOSEC) 20 MG capsule TAKE ONE CAPSULE TWICE A DAY BEFORE A MEAL 180 capsule 3  . omeprazole (PRILOSEC) 20 MG capsule TAKE ONE CAPSULE TWICE A DAY BEFORE A MEAL 180 capsule 0  . valsartan-hydrochlorothiazide (DIOVAN-HCT) 80-12.5 MG tablet Take 1 tablet by mouth daily. 90 tablet 3  . atorvastatin (LIPITOR) 80 MG tablet Take 1 tablet (80 mg total) by mouth daily. 90 tablet 3   Current Facility-Administered Medications on File Prior to Visit  Medication Dose Route Frequency Provider Last Rate Last Dose  . pneumococcal 13-valent conjugate vaccine (PREVNAR 13) injection 0.5 mL  0.5 mL Intramuscular Tomorrow-1000 Renald Haithcock, NP        BP 132/70 (BP Location: Left Arm, Patient Position: Sitting, Cuff Size: Normal)   Wt 232 lb 9.6 oz (105.5 kg)   BMI 29.86 kg/m       Objective:   Physical Exam  Constitutional: He is oriented to person, place, and time. He appears well-developed and well-nourished. No distress.  Cardiovascular: Normal rate, regular rhythm, normal heart sounds and intact distal pulses.  Exam reveals no gallop and no friction rub.   No murmur heard. Pulmonary/Chest: Effort normal and breath sounds normal. No  respiratory distress. He has no wheezes. He exhibits no tenderness.  Musculoskeletal:  Walks with a rolling walker  Neurological: He is alert and oriented to person, place, and time.  Skin: Skin is warm and dry. No rash noted. He is not diaphoretic. No erythema. No pallor.  Psychiatric: He has a normal mood and affect. His behavior is normal. Judgment and thought content normal.  Nursing note and vitals reviewed.     Assessment & Plan:  1. Uncontrolled type 2 diabetes mellitus with diabetic neuropathy, with long-term  current use of insulin (HCC) - POC HgB A1c- 6.9  - Has improved from 7.0.  - Continue to work on diabetic diet and exercise - Follow up in three months or sooner if needed  Shirline Frees, NP

## 2016-04-24 NOTE — Telephone Encounter (Signed)
PATIENT HAS APPT TODAY WITH DR. Vassie LollALVA AT 3:45 !!!!!

## 2016-04-24 NOTE — Assessment & Plan Note (Signed)
Cause for her dyspnea is unclear. May simply be related to deconditioning or cardiac cause. No pulmonary cause apparent. Pulmonary emboli have been ruled out by CT angiogram

## 2016-04-24 NOTE — Patient Instructions (Signed)
We will adjust settings to current machine and recheck download in one month  Okay to resume physical therapy

## 2016-04-24 NOTE — Telephone Encounter (Signed)
Please confirm that he is on ASV settings EPAP 4 cm, min PS 4, max PS 15, back up RR 12

## 2016-04-24 NOTE — Progress Notes (Signed)
Subjective:    Patient ID: Luis Yu, male    DOB: 1945-12-06, 71 y.o.   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 Follow-up    for OSA, see telephone note.     He continues to have significant intermittent dyspnea he has been evaluated by cardiology and EP- not felt to be a candidate for ablation, rate control and anticoagulation is recommended He was seen by my partner Dr. Sherene SiresWert in 02/2016 and ACE inhibitor was stopped.His cough is much improved but he continues to have episodes of dyspnea  He had several residual central events on CPAP and persisted in spite of increasing CPAP settings (to auto), hence underwent titration with ASV machine He has been started on a new ASV machine after sleep study and wife states that he is sleeping much better  Download however shows significant centrals   He did not desaturate on walking 3 laps around the office  Significant tests/ events   PSG 6/27 /11 - wt 246 lbs -showed severe obstructive sleep apnea with AHI 50/h &desaturation to 87%, corrected by CPAP 10 cm - he did not tolerate a full face mask   Jan 2012 download on 12 cm shows leak ++, residual AHI 21/h,centrals 5/h, good usage Download on 12 cm 3/6-06/20/10 shows increased centrals 11/h, with AHI 28/h, hypopneas 11/h  2D Echo > done 11/301/7 with nl ef/ Mod LAE/ RAE   Referral to Cardiology > atrial flutter cardioverted again 02/21/16 only a little less sob  CT Angio Chest 02/16/16: No acute pulmonary embolus. Trace left and small right pleural effusions   03/2016 Trial of [facility] EPAP 7 cmH2O, Pressure Support Min 4 and Max 15 cmH2O with Max Pressure of 15  cmH2O   Past Medical History:  Diagnosis Date  . Adjustment disorder with mixed emotional features   . Atrial flutter (HCC)   . CAD (coronary artery disease)   . Depression   . Diabetes mellitus type II, uncontrolled (HCC)   . Diabetic peripheral neuropathy (HCC)   . Erectile dysfunction   . GERD (gastroesophageal reflux disease)   . Herpes zoster   . Hyperlipidemia   . Hypertension   . Obesity   . Osteoarthritis   . Postherpetic neuralgia   . Sleep apnea      Review of Systems neg for any significant sore throat, dysphagia, itching, sneezing, nasal congestion or excess/ purulent secretions, fever, chills, sweats, unintended wt loss, pleuritic or exertional cp, hempoptysis, orthopnea pnd or change in chronic leg swelling. Also denies presyncope, palpitations, heartburn, abdominal pain, nausea, vomiting, diarrhea or change in bowel or urinary habits, dysuria,hematuria, rash, arthralgias, visual complaints, headache, numbness weakness or ataxia.    Objective:   Physical Exam  Gen. Pleasant, well-nourished, in no distress ENT - no lesions, no post nasal drip Neck: No JVD, no thyromegaly, no carotid bruits Lungs: no use of accessory muscles, no dullness to percussion, clear without rales or rhonchi  Cardiovascular: Rhythm regular, heart sounds  normal, no murmurs or gallops, no peripheral edema Musculoskeletal: No deformities, no cyanosis or clubbing        Assessment & Plan:

## 2016-05-07 ENCOUNTER — Encounter (HOSPITAL_COMMUNITY): Payer: Self-pay | Admitting: Nurse Practitioner

## 2016-05-07 ENCOUNTER — Ambulatory Visit (HOSPITAL_COMMUNITY)
Admission: RE | Admit: 2016-05-07 | Discharge: 2016-05-07 | Disposition: A | Discharge status: Home / Self Care | Payer: Medicare Other | Source: Ambulatory Visit | Attending: Nurse Practitioner | Admitting: Nurse Practitioner

## 2016-05-07 ENCOUNTER — Emergency Department (HOSPITAL_COMMUNITY): Payer: Medicare Other

## 2016-05-07 ENCOUNTER — Telehealth: Payer: Self-pay | Admitting: Adult Health

## 2016-05-07 ENCOUNTER — Encounter (HOSPITAL_COMMUNITY): Payer: Self-pay | Admitting: Emergency Medicine

## 2016-05-07 ENCOUNTER — Emergency Department (HOSPITAL_COMMUNITY)
Admission: EM | Admit: 2016-05-07 | Discharge: 2016-05-08 | Disposition: A | Discharge status: Home / Self Care | Payer: Medicare Other | Attending: Emergency Medicine | Admitting: Emergency Medicine

## 2016-05-07 VITALS — BP 144/72 | HR 54 | Ht 74.0 in | Wt 230.0 lb

## 2016-05-07 DIAGNOSIS — Z7982 Long term (current) use of aspirin: Secondary | ICD-10-CM | POA: Insufficient documentation

## 2016-05-07 DIAGNOSIS — Y939 Activity, unspecified: Secondary | ICD-10-CM | POA: Insufficient documentation

## 2016-05-07 DIAGNOSIS — Z8249 Family history of ischemic heart disease and other diseases of the circulatory system: Secondary | ICD-10-CM | POA: Diagnosis not present

## 2016-05-07 DIAGNOSIS — Z794 Long term (current) use of insulin: Secondary | ICD-10-CM | POA: Insufficient documentation

## 2016-05-07 DIAGNOSIS — I481 Persistent atrial fibrillation: Secondary | ICD-10-CM

## 2016-05-07 DIAGNOSIS — I2510 Atherosclerotic heart disease of native coronary artery without angina pectoris: Secondary | ICD-10-CM | POA: Insufficient documentation

## 2016-05-07 DIAGNOSIS — I10 Essential (primary) hypertension: Secondary | ICD-10-CM | POA: Insufficient documentation

## 2016-05-07 DIAGNOSIS — S92414A Nondisplaced fracture of proximal phalanx of right great toe, initial encounter for closed fracture: Secondary | ICD-10-CM

## 2016-05-07 DIAGNOSIS — Y999 Unspecified external cause status: Secondary | ICD-10-CM | POA: Insufficient documentation

## 2016-05-07 DIAGNOSIS — I4819 Other persistent atrial fibrillation: Secondary | ICD-10-CM

## 2016-05-07 DIAGNOSIS — W228XXA Striking against or struck by other objects, initial encounter: Secondary | ICD-10-CM | POA: Insufficient documentation

## 2016-05-07 DIAGNOSIS — I4891 Unspecified atrial fibrillation: Secondary | ICD-10-CM | POA: Diagnosis not present

## 2016-05-07 DIAGNOSIS — E1140 Type 2 diabetes mellitus with diabetic neuropathy, unspecified: Secondary | ICD-10-CM | POA: Insufficient documentation

## 2016-05-07 DIAGNOSIS — K219 Gastro-esophageal reflux disease without esophagitis: Secondary | ICD-10-CM | POA: Diagnosis not present

## 2016-05-07 DIAGNOSIS — Z79899 Other long term (current) drug therapy: Secondary | ICD-10-CM | POA: Insufficient documentation

## 2016-05-07 DIAGNOSIS — Z7901 Long term (current) use of anticoagulants: Secondary | ICD-10-CM | POA: Insufficient documentation

## 2016-05-07 DIAGNOSIS — E669 Obesity, unspecified: Secondary | ICD-10-CM | POA: Diagnosis not present

## 2016-05-07 DIAGNOSIS — N529 Male erectile dysfunction, unspecified: Secondary | ICD-10-CM | POA: Insufficient documentation

## 2016-05-07 DIAGNOSIS — I4892 Unspecified atrial flutter: Secondary | ICD-10-CM | POA: Insufficient documentation

## 2016-05-07 DIAGNOSIS — Z951 Presence of aortocoronary bypass graft: Secondary | ICD-10-CM | POA: Diagnosis not present

## 2016-05-07 DIAGNOSIS — Z96653 Presence of artificial knee joint, bilateral: Secondary | ICD-10-CM | POA: Diagnosis not present

## 2016-05-07 DIAGNOSIS — Y92009 Unspecified place in unspecified non-institutional (private) residence as the place of occurrence of the external cause: Secondary | ICD-10-CM | POA: Insufficient documentation

## 2016-05-07 DIAGNOSIS — Z6829 Body mass index (BMI) 29.0-29.9, adult: Secondary | ICD-10-CM | POA: Insufficient documentation

## 2016-05-07 DIAGNOSIS — E785 Hyperlipidemia, unspecified: Secondary | ICD-10-CM | POA: Diagnosis not present

## 2016-05-07 DIAGNOSIS — G4731 Primary central sleep apnea: Secondary | ICD-10-CM | POA: Insufficient documentation

## 2016-05-07 DIAGNOSIS — E1142 Type 2 diabetes mellitus with diabetic polyneuropathy: Secondary | ICD-10-CM | POA: Insufficient documentation

## 2016-05-07 DIAGNOSIS — S92421A Displaced fracture of distal phalanx of right great toe, initial encounter for closed fracture: Secondary | ICD-10-CM | POA: Insufficient documentation

## 2016-05-07 DIAGNOSIS — R0609 Other forms of dyspnea: Secondary | ICD-10-CM | POA: Insufficient documentation

## 2016-05-07 DIAGNOSIS — F329 Major depressive disorder, single episode, unspecified: Secondary | ICD-10-CM | POA: Diagnosis not present

## 2016-05-07 DIAGNOSIS — Z823 Family history of stroke: Secondary | ICD-10-CM | POA: Diagnosis not present

## 2016-05-07 DIAGNOSIS — S99921A Unspecified injury of right foot, initial encounter: Secondary | ICD-10-CM | POA: Diagnosis present

## 2016-05-07 DIAGNOSIS — E11319 Type 2 diabetes mellitus with unspecified diabetic retinopathy without macular edema: Secondary | ICD-10-CM | POA: Insufficient documentation

## 2016-05-07 DIAGNOSIS — M1990 Unspecified osteoarthritis, unspecified site: Secondary | ICD-10-CM | POA: Insufficient documentation

## 2016-05-07 LAB — CBG MONITORING, ED: Glucose-Capillary: 124 mg/dL — ABNORMAL HIGH (ref 65–99)

## 2016-05-07 NOTE — ED Notes (Signed)
Patient transported to [facility] 

## 2016-05-07 NOTE — ED Triage Notes (Signed)
Pt presents after stubbing R great toe at home; pt states he is a diabetic and also has hx afib and on anticoagulants

## 2016-05-07 NOTE — ED Provider Notes (Signed)
[facility]-EMERGENCY DEPT Provider Note   CSN: [id number] Arrival date & time: 05/07/16  1934  By signing my name below, I, Linna DarnerRussell Turner, attest that this documentation has been prepared under the direction and in the presence of Felicie Mornavid Aking Klabunde, NP. Electronically Signed: Linna Darnerussell Turner, Scribe. 05/07/2016. 9:38 PM.  History   Chief Complaint Chief Complaint  Patient presents with  . Toe Pain    diabetic, on anticoagulants    The history is provided by the patient. No language interpreter was used.  Toe Pain  This is a new problem. The current episode started 3 to 5 hours ago. The problem occurs constantly. The problem has not changed since onset.Pertinent negatives include no chest pain, no abdominal pain, no headaches and no shortness of breath. Nothing aggravates the symptoms. Nothing relieves the symptoms. He has tried nothing for the symptoms.  Injury  This is a new problem. The current episode started 3 to 5 hours ago. The problem occurs constantly. The problem has not changed since onset.Pertinent negatives include no chest pain, no abdominal pain, no headaches and no shortness of breath. Nothing aggravates the symptoms. Nothing relieves the symptoms. He has tried nothing for the symptoms.    HPI Comments: Luis Yu is a 71 y.o. male on anticoagulants with PMHx including osteoarthritis, A-Fib, CAD, HTN, HLD, DM and peripheral neuropathy who presents to the Emergency Department complaining of a right big toe wound sustained shortly PTA. He states he "stubbed" his right big toe on the ground and has had significant bleeding from the toenail since. Pt reports pain secondary to the wound. He notes he struck the same toe on a table leg about 6 weeks ago and had no significant pain or bleeding. He is on Eliquis x2 daily. Pt denies numbness/tingling or any other associated symptoms. Wife notes pt has an appointment with a podiatrist in two days and pt intends to discuss the wound he sustained  today with them.  Past Medical History:  Diagnosis Date  . Adjustment disorder with mixed emotional features   . Atrial flutter (HCC)   . CAD (coronary artery disease)   . Depression   . Diabetes mellitus type II, uncontrolled (HCC)   . Diabetic peripheral neuropathy (HCC)   . Erectile dysfunction   . GERD (gastroesophageal reflux disease)   . Herpes zoster   . Hyperlipidemia   . Hypertension   . Obesity   . Osteoarthritis   . Postherpetic neuralgia   . Sleep apnea     Patient Active Problem List   Diagnosis Date Noted  . Insomnia 02/02/2016  . Atrial fibrillation (HCC) 10/27/2015  . Uncoordinated movements 02/24/2014  . Unspecified hereditary and idiopathic peripheral neuropathy 02/24/2013  . Low back pain 02/24/2013  . Abnormality of gait 02/09/2013  . Memory loss, short term 12/05/2011  . LUMBAR STRAIN, ACUTE 05/05/2010  . OSA (obstructive sleep apnea) 09/14/2009  . BPH (benign prostatic hyperplasia) 07/27/2009  . CARDIOVASCULAR STUDIES, ABNORMAL 07/27/2009  . CHEST PAIN 06/08/2009  . OSTEOARTHRITIS 05/30/2009  . HEEL PAIN, LEFT 05/24/2009  . Dyspnea on exertion 05/24/2009  . ADJUSTMENT DISORDER WITH MIXED FEATURES 08/25/2008  . Diabetes type 2, uncontrolled (HCC) 02/24/2008  . ERECTILE DYSFUNCTION 02/10/2007  . Hyperlipidemia 10/09/2006  . OBESITY 10/09/2006  . Essential hypertension 10/09/2006  . Coronary atherosclerosis 10/09/2006  . GERD 10/09/2006  . DM (diabetes mellitus) type II uncontrolled with retinopathy 06/10/2006    Past Surgical History:  Procedure Laterality Date  . CARDIOVERSION N/A 11/10/2015  Procedure: CARDIOVERSION;  Surgeon: Laurey Morale, MD;  Location: [facility] ENDOSCOPY;  Service: Cardiovascular;  Laterality: N/A;  . CARDIOVERSION N/A 02/21/2016   Procedure: CARDIOVERSION;  Surgeon: Lewayne Bunting, MD;  Location: [facility] ENDOSCOPY;  Service: Cardiovascular;  Laterality: N/A;  . CORONARY ARTERY BYPASS GRAFT  1996   eith a LIMA to the LAD,  saphenous vein graft to the acute marginal, saphenous vein graft to the PDA and saphenous vein graft to the circumflex.  [name] KNEE ARTHROSCOPY  05/2004   right knee  . REPLACEMENT TOTAL KNEE BILATERAL    . SHOULDER SURGERY         Home Medications    Prior to Admission medications   Medication Sig Start Date End Date Taking? Authorizing Provider  apixaban (ELIQUIS) 5 MG TABS tablet Take 1 tablet (5 mg total) by mouth 2 (two) times daily. 11/17/15   Newman Nip, NP  aspirin EC 81 MG tablet Take 1 tablet (81 mg total) by mouth daily. 10/14/15   Brittainy Sherlynn Carbon, PA-C  atorvastatin (LIPITOR) 80 MG tablet Take 1 tablet (80 mg total) by mouth daily. 03/29/16   Shirline Frees, NP  atorvastatin (LIPITOR) 80 MG tablet TAKE 1 TABLET (80 MG TOTAL) BY MOUTH DAILY. 04/11/16   Shirline Frees, NP  buPROPion (WELLBUTRIN SR) 150 MG 12 hr tablet Take 1 tablet (150 mg total) by mouth 2 (two) times daily. 03/29/16   Shirline Frees, NP  glipiZIDE (GLUCOTROL) 10 MG tablet Take 1 tablet (10 mg total) by mouth 2 (two) times daily before a meal. 03/29/16   Shirline Frees, NP  glucose blood (ACCU-CHEK ACTIVE STRIPS) test strip Use as instructed 03/29/16   Shirline Frees, NP  insulin aspart (NOVOLOG FLEXPEN) 100 UNIT/ML FlexPen USE 15 TO 30 UNITS 15 MINUTES BEFORE MEALS 3 TIMES DAILY 03/29/16   Shirline Frees, NP  Insulin Glargine (BASAGLAR KWIKPEN) 100 UNIT/ML SOPN Inject 0.2 mLs (20 Units total) into the skin at bedtime. 03/29/16   Shirline Frees, NP  Insulin Pen Needle (PEN NEEDLES 31GX5/16") 31G X 8 MM MISC Use to inject insulin 5 times a day. 03/29/16   Shirline Frees, NP  isosorbide mononitrate (IMDUR) 30 MG 24 hr tablet Take 1 tablet (30 mg total) by mouth daily. 10/31/15   Rollene Rotunda, MD  Lancets (ACCU-CHEK MULTICLIX) lancets Use up to 5 times a day to check blood sugar. 03/29/16   Shirline Frees, NP  metFORMIN (GLUCOPHAGE) 1000 MG tablet Take 1 tablet (1,000 mg total) by mouth 2 (two) times daily with a meal. 03/29/16    Shirline Frees, NP  Nebivolol HCl 20 MG TABS Take 1 tablet (20 mg total) by mouth daily. 03/29/16   Shirline Frees, NP  omeprazole (PRILOSEC) 20 MG capsule TAKE ONE CAPSULE TWICE A DAY BEFORE A MEAL 03/29/16   Shirline Frees, NP  omeprazole (PRILOSEC) 20 MG capsule TAKE ONE CAPSULE TWICE A DAY BEFORE A MEAL 04/02/16   Shirline Frees, NP  valsartan-hydrochlorothiazide (DIOVAN-HCT) 80-12.5 MG tablet Take 1 tablet by mouth daily. 03/29/16   Shirline Frees, NP    Family History Family History  Problem Relation Age of Onset  . Coronary artery disease Mother     deceased at 20  . Coronary artery disease Father     died age 16  . Stroke      half brother  . Stroke Brother     Social History Social History  Substance Use Topics  . Smoking status: Never Smoker  . Smokeless tobacco: Never Used  .  Alcohol use 1.2 oz/week    2 Standard drinks or equivalent per week     Comment: twice a month.     Allergies   Patient has no known allergies.   Review of Systems Review of Systems  Respiratory: Negative for shortness of breath.   Cardiovascular: Negative for chest pain.  Gastrointestinal: Negative for abdominal pain.  Musculoskeletal: Positive for arthralgias.  Skin: Positive for wound.  Neurological: Negative for numbness and headaches.  All other systems reviewed and are negative.    Physical Exam Updated Vital Signs BP 184/86 (BP Location: Right Arm)   Pulse 77   Temp 98.3 F (36.8 C) (Oral)   SpO2 98%   Physical Exam  Constitutional: He is oriented to person, place, and time. He appears well-developed and well-nourished. No distress.  HENT:  Head: Normocephalic and atraumatic.  Eyes: Conjunctivae and EOM are normal.  Neck: Neck supple. No tracheal deviation present.  Cardiovascular: Normal rate.  An irregularly irregular rhythm present.  Pulmonary/Chest: Effort normal. No respiratory distress.  Musculoskeletal: Normal range of motion.  Neurological: He is alert and oriented  to person, place, and time.  Skin: Skin is warm and dry.  Psychiatric: He has a normal mood and affect. His behavior is normal.  Nursing note and vitals reviewed.      ED Treatments / Results  Labs (all labs ordered are listed, but only abnormal results are displayed) Labs Reviewed - No data to display  EKG  EKG Interpretation None       Radiology No results found.  Procedures Procedures (including critical care time)  DIAGNOSTIC STUDIES: Oxygen Saturation is 98% on RA, normal by my interpretation.    COORDINATION OF CARE: 9:44 PM Discussed treatment plan with pt at bedside and pt agreed to plan.  Medications Ordered in ED Medications - No data to display   Initial Impression / Assessment and Plan / ED Course  I have reviewed the triage vital signs and the nursing notes.  Pertinent labs & imaging results that were available during my care of the patient were reviewed by me and considered in my medical decision making (see chart for details).       Final Clinical Impressions(s) / ED Diagnoses  Patient X-Joffe positive for great toe fracture. Patient discussed with and seen by Dr. Patria Mane.   Pt advised to follow up with podiatrist on Wednesday as scheduled. Toe wound dressed, buddy tape and post-op shoe provided while in ED, conservative therapy recommended and discussed. Patient will be discharged home & is agreeable with above plan. Returns precautions discussed. Pt appears safe for discharge. Final diagnoses:  Closed nondisplaced fracture of proximal phalanx of right great toe, initial encounter    New Prescriptions New Prescriptions   No medications on file   I personally performed the services described in this documentation, which was scribed in my presence. The recorded information has been reviewed and is accurate.    Felicie Morn, NP 05/08/16 1610    Azalia Bilis, MD 05/08/16 1124

## 2016-05-07 NOTE — Progress Notes (Signed)
Primary Care Physician: Shirline Freesory Nafziger, NP Referring Physician: Dr. Burna Fortsrenshaw   Luis Yu is a 71 y.o. male with a h/o CAD,s/p CABG, severe sleep apnea, atrial flutter (on eliquis), poorly controlled T2DM on insulin, peripheral neuropathy, HTN, HLD who presents to the afib clinic for evaluation urgently at the request of [name], NP, who pt saw earlier today.  CAD history is of the following...cath of 2011 revealed severe stenosis in the proximal and distal body of the saphenous vein graft to the left-sided PDA. There was normal left ventricular systolic function. The patient had PCI with a drug-eluting stent at that time. He had repeat cardiac cath in Dec of 2011 for dyspnea and f/u abnormal myoview. This revealed an EF of 50%, continued patency ofinternal mammary to the LAD, occluded saphenous vein graft  to the distal circumflex/PDA territory and progressive disease in the saphenous vein graft to the OM. The patient was evaluated by CVTS. Distal vessels werefelt to be poor targets. It was decided that CABG could be considered in the future if PCI options were exhausted. The patient had PCI of the saphenous vein graft to the obtuse marginal. He was last seen by [name] in late July after [facility].  He was told that he had a heart attack but was managed medically.  He was in urgent care after this for SOB. EKG showed new atrial flutter. EKG on last appt demonstrated atrial flutter.  A monitor that  was placed showed persistent  atrial flutter.  At that appt the Plavix was stopped and Eliquis was started.  A stress test was recommended. The patient had no ischemia. He was set up for cardioversion 8/24 and was seen back in the afib clinic 8/31 and was found to be in SR.    He has had onging progressive shortness of breath and cough has seen many providers since cardioversion. He is thought to have central sleep apnea and just had a repeat sleep  study but did not sleep well enough to obtain study. Only one EKG was done in pulmonary over the last few months and it was read as SR but definite flutter waves were present. HR has been reported in the 60-70's on office visits. CT of chest yesterday showed small left and right pleural effusions with BNP of 247. Does not appear fluid overloaded but definitely has exertional dyspnea with walking a very short distance (10-15 feet) Echo yesterday reveals normal EF.  He returns from [facility] 12/11 and is in [facility]. His dyspnea has improved over the last few days and he can walk a greater distance form previous. He is still pending a sleep study for central apnea.   He was seen by Dr. Johney FrameAllred 1/29 and was in afib with rate control. He continued to have chronic profound dyspnea of unclear etiology. He had these same symptoms even when he was in SR. He recommended  rate control and to be seen by pulmonary for evaluation. He was seen by pulmonary and no specific reason for dyspnea was determined other than recommendation for full treatment of his sleep apnea, his deconditioning is also felt to be contributing to dyspnea. Dr. Johney FrameAllred did mention that tikosyn could be tried, but questioned long term ability to maintain SR due to left atrial size of 54 mm. He has felt better since starting ASV for cental apnea, less shortness of breath but his daily activities are still limited by exertional dyspnea.  Today,  he c/o symptoms of  exertional dyspnea associated with dizziness, improved since staring ASV . No orthopnea, PND, lower extremity edema,   presyncope, syncope, or neurologic sequela. The patient is tolerating medications without difficulties and is otherwise without complaint today.   Past Medical History:  Diagnosis Date  . Adjustment disorder with mixed emotional features   . Atrial flutter (HCC)   . CAD (coronary artery disease)   . Depression   . Diabetes mellitus type II, uncontrolled (HCC)   . Diabetic  peripheral neuropathy (HCC)   . Erectile dysfunction   . GERD (gastroesophageal reflux disease)   . Herpes zoster   . Hyperlipidemia   . Hypertension   . Obesity   . Osteoarthritis   . Postherpetic neuralgia   . Sleep apnea    Past Surgical History:  Procedure Laterality Date  . CARDIOVERSION N/A 11/10/2015   Procedure: CARDIOVERSION;  Surgeon: Laurey Morale, MD;  Location: [facility] ENDOSCOPY;  Service: Cardiovascular;  Laterality: N/A;  . CARDIOVERSION N/A 02/21/2016   Procedure: CARDIOVERSION;  Surgeon: Lewayne Bunting, MD;  Location: [facility] ENDOSCOPY;  Service: Cardiovascular;  Laterality: N/A;  . CORONARY ARTERY BYPASS GRAFT  1996   eith a LIMA to the LAD, saphenous vein graft to the acute marginal, saphenous vein graft to the PDA and saphenous vein graft to the circumflex.  [name] KNEE ARTHROSCOPY  05/2004   right knee  . REPLACEMENT TOTAL KNEE BILATERAL    . SHOULDER SURGERY      Current Outpatient Prescriptions  Medication Sig Dispense Refill  . apixaban (ELIQUIS) 5 MG TABS tablet Take 1 tablet (5 mg total) by mouth 2 (two) times daily. 60 tablet 11  . aspirin EC 81 MG tablet Take 1 tablet (81 mg total) by mouth daily.    [name] atorvastatin (LIPITOR) 80 MG tablet Take 1 tablet (80 mg total) by mouth daily. 90 tablet 3  . atorvastatin (LIPITOR) 80 MG tablet TAKE 1 TABLET (80 MG TOTAL) BY MOUTH DAILY. 90 tablet 3  . buPROPion (WELLBUTRIN SR) 150 MG 12 hr tablet Take 1 tablet (150 mg total) by mouth 2 (two) times daily. 180 tablet 1  . glipiZIDE (GLUCOTROL) 10 MG tablet Take 1 tablet (10 mg total) by mouth 2 (two) times daily before a meal. 180 tablet 3  . glucose blood (ACCU-CHEK ACTIVE STRIPS) test strip Use as instructed 100 each 12  . insulin aspart (NOVOLOG FLEXPEN) 100 UNIT/ML FlexPen USE 15 TO 30 UNITS 15 MINUTES BEFORE MEALS 3 TIMES DAILY 15 pen 6  . Insulin Glargine (BASAGLAR KWIKPEN) 100 UNIT/ML SOPN Inject 0.2 mLs (20 Units total) into the skin at bedtime. 3 pen 11  . Insulin Pen  Needle (PEN NEEDLES 31GX5/16") 31G X 8 MM MISC Use to inject insulin 5 times a day. 100 each 11  . isosorbide mononitrate (IMDUR) 30 MG 24 hr tablet Take 1 tablet (30 mg total) by mouth daily. 30 tablet 12  . Lancets (ACCU-CHEK MULTICLIX) lancets Use up to 5 times a day to check blood sugar. 100 each 5  . metFORMIN (GLUCOPHAGE) 1000 MG tablet Take 1 tablet (1,000 mg total) by mouth 2 (two) times daily with a meal. 180 tablet 3  . Nebivolol HCl 20 MG TABS Take 1 tablet (20 mg total) by mouth daily. 90 tablet 3  . omeprazole (PRILOSEC) 20 MG capsule TAKE ONE CAPSULE TWICE A DAY BEFORE A MEAL 180 capsule 3  . omeprazole (PRILOSEC) 20 MG capsule TAKE ONE CAPSULE TWICE A DAY BEFORE  A MEAL 180 capsule 0  . valsartan-hydrochlorothiazide (DIOVAN-HCT) 80-12.5 MG tablet Take 1 tablet by mouth daily. 90 tablet 3   Current Facility-Administered Medications  Medication Dose Route Frequency Provider Last Rate Last Dose  . pneumococcal 13-valent conjugate vaccine (PREVNAR 13) injection 0.5 mL  0.5 mL Intramuscular Tomorrow-1000 Shirline Frees, NP        No Known Allergies  Social History   Social History  . Marital status: Married    Spouse name: [name]  . Number of children: 1  . Years of education: 39   Occupational History  . Retired Naval architect Retired   Social History Main Topics  . Smoking status: Never Smoker  . Smokeless tobacco: Never Used  . Alcohol use 1.2 oz/week    2 Standard drinks or equivalent per week     Comment: twice a month.  . Drug use: No  . Sexual activity: Yes    Partners: Female   Other Topics Concern  . Not on file   Social History Narrative   Married [name])   Regular exercise: none   Caffeine use: cup of coffee daily    Retired Naval architect   Caffeine- twice daily             Family History  Problem Relation Age of Onset  . Coronary artery disease Mother     deceased at 32  . Coronary artery disease Father     died age 31  . Stroke      half  brother  . Stroke Brother     ROS- All systems are reviewed and negative except as per the HPI above  Physical Exam: Vitals:   05/07/16 1427  BP: (!) 144/72  Pulse: (!) 54  Weight: 230 lb (104.3 kg)  Height: 6\' 2"  (1.88 m)   Wt Readings from Last 3 Encounters:  05/07/16 230 lb (104.3 kg)  04/24/16 227 lb (103 kg)  04/24/16 232 lb 9.6 oz (105.5 kg)    Labs: Lab Results  Component Value Date   NA 139 02/15/2016   K 4.2 02/15/2016   CL 102 02/15/2016   CO2 30 02/15/2016   GLUCOSE 149 (H) 02/15/2016   BUN 23 02/15/2016   CREATININE 1.15 02/15/2016   CALCIUM 8.9 02/15/2016   Lab Results  Component Value Date   INR 1.18 12/07/2015   Lab Results  Component Value Date   CHOL 142 05/13/2015   HDL 54 05/13/2015   LDLCALC 73 05/13/2015   TRIG 77 05/13/2015  Pulse Ox 95%   GEN- The patient is well appearing, alert and oriented x 3 today.   Head- normocephalic, atraumatic Eyes-  Sclera clear, conjunctiva pink Ears- hearing intact Oropharynx- clear Neck- supple, no JVP Lymph- no cervical lymphadenopathy Lungs- Clear to ausculation bilaterally, normal work of breathing Heart- mildly irregular rate and rhythm, no murmurs, rubs or gallops, PMI not laterally displaced GI- soft, NT, ND, + BS Extremities- no clubbing, cyanosis MS- no significant deformity or atrophy Skin- no rash or lesion Psych- euthymic mood, full affect Neuro- strength and sensation are intact   EKG- slow afib at 54 bpm, qrs int 92 ms, qtc 415 ms  CXR- 11/29-Cardiac shadow is stable. Postsurgical changes are again seen. The lungs are well aerated bilaterally. No focal infiltrate or sizable effusion is seen. No acute bony abnormality is noted.  IMPRESSION: No acute abnormality seen.  Echo- 11/30-Study Conclusions  - Left ventricle: The cavity size was mildly dilated. Wall   thickness was  increased in a pattern of mild LVH. Systolic   function was normal. The estimated ejection fraction was  in the   range of 60% to 65%. The study is not technically sufficient to   allow evaluation of LV diastolic function. - Aortic valve: AV is thickened, calcified with minimally   restricted motion. - Mitral valve: Moderately calcified annulus. Mildly thickened   leaflets . There was mild regurgitation. - Left atrium: The atrium was moderately dilated. - Right atrium: The atrium was moderately dilated.  CT of chest-11/30- IMPRESSION: No acute pulmonary embolus. Trace left and small right pleural effusions.  Calcified granulomata in the right upper and right middle lobes.  Noncalcified 3 mm right middle lobe nodular density noted previously appears to be related to a tortuous vessel as are previously noted densities in the right upper lobe.  Holter monitor- 8/22-Persistent atrial flutter with variable rate. Premature ectopic complexes.  Of note there is conduction with aberrancy when his rate is faster.     Assessment and Plan: 1.Typical aflutter/afib Exertional dyspnea has improved with ASV, has been using for last 3 weeks Exertional dyspnea is most likely multifactorial Dr. Johney Frame mentioned [facility] Lo[facility] but his wife does not feel they could afford, but will check with insurance company May or may not be able to restore SR and may or may not improve symptoms Continue Eliquis Continue f/u with pulmonology for treatment of central sleep apnea  Will see back in 6 weeks after longer use of ASV to see if symptoms have improved and further discuss if pt wants to try [facility] [employer] [address] [place], [place] [address] [phone]

## 2016-05-07 NOTE — ED Triage Notes (Signed)
Bloody bandage in place, RN did not remove for risk of rebleeding

## 2016-05-07 NOTE — Telephone Encounter (Signed)
Pt would like to have his referral renewed to start back to [facility] [place] 336 [phone].

## 2016-05-07 NOTE — Progress Notes (Signed)
Orthopedic Tech Progress Note Patient Details:  Luis ImusDonald W Yu 03-29-1945 [id number]  Ortho Devices Type of Ortho Device: Postop shoe/boot, Buddy tape Ortho Device/Splint Location: rle 1st and 2nd toe buddy tape, post op shoe Ortho Device/Splint Interventions: Ordered, Application   Trinna PostMartinez, Egan Berkheimer J 05/07/2016, 11:23 PM

## 2016-05-08 ENCOUNTER — Other Ambulatory Visit: Payer: Self-pay | Admitting: Adult Health

## 2016-05-08 DIAGNOSIS — R269 Unspecified abnormalities of gait and mobility: Secondary | ICD-10-CM

## 2016-05-08 NOTE — Telephone Encounter (Signed)
Is this ok?

## 2016-05-08 NOTE — Telephone Encounter (Signed)
Referral has been placed. Thank you.

## 2016-05-08 NOTE — Telephone Encounter (Signed)
Ok for referral?

## 2016-05-09 ENCOUNTER — Ambulatory Visit (INDEPENDENT_AMBULATORY_CARE_PROVIDER_SITE_OTHER): Payer: Medicare Other | Admitting: Podiatry

## 2016-05-09 DIAGNOSIS — S92421A Displaced fracture of distal phalanx of right great toe, initial encounter for closed fracture: Secondary | ICD-10-CM | POA: Diagnosis not present

## 2016-05-09 DIAGNOSIS — W450XXA Nail entering through skin, initial encounter: Secondary | ICD-10-CM

## 2016-05-09 DIAGNOSIS — I2510 Atherosclerotic heart disease of native coronary artery without angina pectoris: Secondary | ICD-10-CM | POA: Diagnosis not present

## 2016-05-09 NOTE — Progress Notes (Signed)
Patient ID: Luis Yu, male   DOB: [birth year], 71 y.o.   MRN: [id number] Complaint:  Visit Type: Patient returns to my office for evaluation of his right great toe.  He says 2 days ago. He believes he must have stubbed his toe at his house. He says the toe became bleeding excessively and he went to the emergency room for treatment and x-Kirks was taken and a closed. This nondisplaced fracture was noted to the base of the distal phalanx. He also had significant bleeding coming from the nail area of the right great toe. This patient does have peripheral neuropathy, atrial fibrillation for which she is on blood thinners this cause significant bleeding and the bandage that was removed had significant bleeding noted to the bandage itself. A bandage was applied in the emergency room and told to be left on until he was seen by the podiatrist. He was also given a wooden shoe to ambulate. He presents the office today for continued evaluation and treatment of the right great toe  Podiatric Exam: Vascular: dorsalis pedis and posterior tibial pulses are palpable bilateral. Capillary return is immediate. Temperature gradient is WNL. Skin turgor WNL  Sensorium: Normal Semmes Weinstein monofilament test. Normal tactile sensation bilaterally. Nail Exam: Pt has thick disfigured discolored nails with subungual debris noted bilateral entire nail hallux through fifth toenails Ulcer Exam: There is no evidence of ulcer or pre-ulcerative changes or infection. Orthopedic Exam: Muscle tone and strength are WNL. No limitations in general ROM. No crepitus or effusions noted. HAV 1st MPJ  B/L. Palpable pain noted at the base of the distal phalanx of the right great toe Skin: The nail on the right great toe is present. No evidence of any subungual hematoma noted. There is a hematoma noted at the base of the nail of the right great toe .    Diagnosis:  Fracture right great toe.  Nail injury right great toenail.  Treatment &  Plan Procedures and Treatment: Consent by patient was obtained for treatment procedures. The patient understood the discussion of treatment and procedures well. All questions were answered thoroughly reviewed. Examination of the x-Whitmore taken at the emergency room does reveal a fracture at the base of the proximal phalanx right hallux. There is no evidence of any laceration noted at this time. There is significant bleeding still noted on the bandage that was applied at the emergency room's patient does have peripheral neuropathy as well as atrial fib for which she is taking medication. I discussed treatment options with this patient, which included home soaks and bandage changes daily another option included a Betadine soaked bandage on the right hallux to help to immobilize the toe and allow the toe to stop bleeding completely. It was decided to apply the bandage with Betadine and have him ambulate in the surgical shoe. He is to return the office in one week for further evaluation and treatment. He was told to remove the bandage on Tuesday soaks. The toe and to return on Wednesday for continued evaluation and treatment.  If this condition worsens or becomes very painful, the patient was told to contact this office or go to the Emergency Department at the hospital.       Helane GuntherGregory Nykerria Macconnell DPM

## 2016-05-10 ENCOUNTER — Ambulatory Visit: Payer: Medicare Other | Admitting: Pulmonary Disease

## 2016-05-14 ENCOUNTER — Telehealth: Payer: Self-pay | Admitting: *Deleted

## 2016-05-14 NOTE — Telephone Encounter (Addendum)
Pt's wife, [name] stating Dr. Stacie AcresMayer put a wrap that was to stay on pt's toe until tomorrow, the toe was broken and the nail came off also, needs instructions on what to do after removing the wrap. Left message to call for instructions. I [facility] with [facility], informed her she should soak the wrap off in 1 tablespoon of betadine to 1 qt water to help remove the dressing and there cover the toe with antibiotic ointment covered Telfa and wrap with gauze, do this once a day until seen by Dr. Stacie AcresMayer. [name] states understanding. [name] states she called because she thought I had called again. I asked if there was anything else and she said no. 05/15/2016-[name] called states she is at the store getting supplies for pt's dressing removal, pt is at home trying to cut the dressing off. I told [name] to have pt shower with the dressing on and it would come off in the shower, then soak the foot in a Tablespoon betadine with 1 qt warm water for 20 minutes, then cut telfa or non-stick dressing the size of the wound area lightly coat with neosporin and cover the wound site, then wrap with gauze. I repeated instructions 2 times and [name] repeated back to me. I told her to call with concerns.

## 2016-05-16 ENCOUNTER — Other Ambulatory Visit: Payer: Self-pay | Admitting: Adult Health

## 2016-05-16 ENCOUNTER — Ambulatory Visit (INDEPENDENT_AMBULATORY_CARE_PROVIDER_SITE_OTHER): Payer: Medicare Other | Admitting: Podiatry

## 2016-05-16 ENCOUNTER — Encounter: Payer: Self-pay | Admitting: Podiatry

## 2016-05-16 DIAGNOSIS — I2510 Atherosclerotic heart disease of native coronary artery without angina pectoris: Secondary | ICD-10-CM

## 2016-05-16 DIAGNOSIS — Z760 Encounter for issue of repeat prescription: Secondary | ICD-10-CM

## 2016-05-16 DIAGNOSIS — S92421A Displaced fracture of distal phalanx of right great toe, initial encounter for closed fracture: Secondary | ICD-10-CM | POA: Diagnosis not present

## 2016-05-16 DIAGNOSIS — W450XXA Nail entering through skin, initial encounter: Secondary | ICD-10-CM

## 2016-05-16 DIAGNOSIS — R0602 Shortness of breath: Secondary | ICD-10-CM

## 2016-05-16 DIAGNOSIS — E1140 Type 2 diabetes mellitus with diabetic neuropathy, unspecified: Secondary | ICD-10-CM | POA: Diagnosis not present

## 2016-05-16 NOTE — Progress Notes (Signed)
This patient returns to the office follow-up for diagnosis of a fracture of the right great toe as well as the nail injury to the right great toe. He has worn a Betadine toe For approximately one week and it was removed last night he says he is not having any pain or discomfort from the toe. There is still the presence of black dried blood noted on the nail itself. He presents the office wearing a bandage over the right great toe and ambulating. He presents the office today for an evaluation and treatment  GENERAL APPEARANCE: Alert, conversant. Appropriately groomed. No acute distress.  VASCULAR: Pedal pulses are  palpable at  [facility] and PT bilateral.  Capillary refill time is immediate to all digits,  Normal temperature gradient.  Digital hair growth is present bilateral  NEUROLOGIC: sensation is normal to 5.07 monofilament at 5/5 sites bilateral.  Light touch is intact bilateral, Muscle strength normal.  MUSCULOSKELETAL: acceptable muscle strength, tone and stability bilateral.  Intrinsic muscluature intact bilateral.  Rectus appearance of foot and digits noted bilateral.   DERMATOLOGIC: skin color, texture, and turgor are within normal limits.  No preulcerative lesions or ulcers  are seen, no interdigital maceration noted.  No open lesions present.   No drainage noted.  NAIL  the right hallux toenail does have hematomas noted on the dorsum of the nail itself. This was due to the fact that this patient has atrial fibrillation and is on eliquiss. There is no evidence of any redness, swelling or injury to the proximal medial or lateral aspect of the skin around the nail. The nail appears to be intact except there does seem to be some breaks in the nail itself at the site of the hematomas   Fracture right hallux  Nail injury right hallux nail.  ROV  this patient was told to soak his right toe in Epsom salts for the next 3 days twice a day. He isn't a bandage the toe until the next soak continue to ambulate  with a surgical shoe for approximately 6 weeks or until the pain has resolved. She is to return to the office when necessary  Helane GuntherGregory Cambren Helm DPM

## 2016-05-16 NOTE — Telephone Encounter (Signed)
Ok to refill for one year  

## 2016-05-22 ENCOUNTER — Encounter: Payer: Self-pay | Admitting: Adult Health

## 2016-05-22 ENCOUNTER — Ambulatory Visit (INDEPENDENT_AMBULATORY_CARE_PROVIDER_SITE_OTHER): Payer: Medicare Other | Admitting: Adult Health

## 2016-05-22 ENCOUNTER — Telehealth: Payer: Self-pay | Admitting: Cardiology

## 2016-05-22 DIAGNOSIS — I2510 Atherosclerotic heart disease of native coronary artery without angina pectoris: Secondary | ICD-10-CM | POA: Diagnosis not present

## 2016-05-22 DIAGNOSIS — G4733 Obstructive sleep apnea (adult) (pediatric): Secondary | ICD-10-CM | POA: Diagnosis not present

## 2016-05-22 NOTE — Assessment & Plan Note (Addendum)
Poor control on ASV - ? Mask issue contributing to poor control  Set for mask fitting /desensitization   Plan  Patient Instructions  Continue on ASV sleep machine .  Do not drive if sleepy.  Go to sleep center for mask fitting .  Follow up with Dr. Vassie LollAlva  In 6 -8 weeks and As needed

## 2016-05-22 NOTE — Addendum Note (Signed)
Addended by: Boone MasterJONES, JESSICA E on: 05/22/2016 02:45 PM   Modules accepted: Orders

## 2016-05-22 NOTE — Patient Instructions (Signed)
Continue on ASV sleep machine .  Do not drive if sleepy.  Go to sleep center for mask fitting .  Follow up with Dr. Vassie LollAlva  In 6 -8 weeks and As needed

## 2016-05-22 NOTE — Progress Notes (Signed)
@Patient  ID: Luis Yu, male    DOB: 1946-03-04, 71 y.o.   MRN: [id number]  Chief Complaint  Patient presents with  . Follow-up    OSA    Referring provider: Shirline Frees, NP  HPI: 71 year old male never smoker followed for obstructive sleep apnea History of atrial fibrillation and coronary artery disease status post CABG  TEST  Significant tests/ events   PSG 6/27 /11 - wt 246 lbs -showed severe obstructive sleep apnea with AHI 50/h &desaturation to 87%, corrected by CPAP 10 cm - he did not tolerate a full face mask   Jan 2012 download on 12 cm shows leak ++, residual AHI 21/h,centrals 5/h, good usage Download on 12 cm 3/6-06/20/10 shows increased centrals 11/h, with AHI 28/h, hypopneas 11/h  2D Echo >done 11/301/7 with nl ef/ Mod LAE/ RAE  Referral to Cardiology >atrial flutter cardioverted again 02/21/16 only a little less sob  CT Angio Chest 02/16/16: No acute pulmonary embolus. Trace left and small right pleural effusions   03/2016 Trial of SV Advance EPAP 7 cmH2O, Pressure Support Min 4 and Max 15 cmH2O with Max Pressure of 15 cmH2O   05/22/2016 Follow up : OSA  Patient returns for a one-month follow-up for sleep apnea. Pt has been started on ASV  -V auto with IPAP 15, EPAP 7 , PS 4 cm H2O.  Says he wears it each night but the mask leaks all night long. Wakes him up .  He says it feels the pressures seem to cut off.  Download shows excellent compliance with avg usage at 5.5 hr . AHI 24.7 +++leaks.  He has tried 2 different full face masks.     No Known Allergies  Immunization History  Administered Date(s) Administered  . Influenza Split 12/15/2010  . Influenza Whole 01/20/1996, 02/10/2007, 12/21/2008, 12/06/2009  . Influenza, High Dose Seasonal PF 02/18/2015, 11/22/2015  . Influenza,inj,Quad PF,36+ Mos 02/09/2013, 02/08/2014  . Pneumococcal Polysaccharide-23 02/10/2007  . Td 03/20/2001  . Tdap 07/25/2011  . Zoster 05/30/2009, 05/31/2009     Past Medical History:  Diagnosis Date  . Adjustment disorder with mixed emotional features   . Atrial flutter (HCC)   . CAD (coronary artery disease)   . Depression   . Diabetes mellitus type II, uncontrolled (HCC)   . Diabetic peripheral neuropathy (HCC)   . Erectile dysfunction   . GERD (gastroesophageal reflux disease)   . Herpes zoster   . Hyperlipidemia   . Hypertension   . Obesity   . Osteoarthritis   . Postherpetic neuralgia   . Sleep apnea     Tobacco History: History  Smoking Status  . Never Smoker  Smokeless Tobacco  . Never Used   Counseling given: Not Answered   Outpatient Encounter Prescriptions as of 05/22/2016  Medication Sig  . apixaban (ELIQUIS) 5 MG TABS tablet Take 1 tablet (5 mg total) by mouth 2 (two) times daily.  [name] aspirin EC 81 MG tablet Take 1 tablet (81 mg total) by mouth daily.  [name] atorvastatin (LIPITOR) 80 MG tablet Take 1 tablet (80 mg total) by mouth daily.  [name] atorvastatin (LIPITOR) 80 MG tablet TAKE 1 TABLET (80 MG TOTAL) BY MOUTH DAILY.  [name] buPROPion (WELLBUTRIN SR) 150 MG 12 hr tablet Take 1 tablet (150 mg total) by mouth 2 (two) times daily.  [name] BYSTOLIC 20 MG TABS TAKE 1 TABLET (20 MG TOTAL) BY MOUTH DAILY.  [name] glipiZIDE (GLUCOTROL) 10 MG tablet Take 1 tablet (10 mg total) by mouth  2 (two) times daily before a meal.  . glucose blood (ACCU-CHEK ACTIVE STRIPS) test strip Use as instructed  . insulin aspart (NOVOLOG FLEXPEN) 100 UNIT/ML FlexPen USE 15 TO 30 UNITS 15 MINUTES BEFORE MEALS 3 TIMES DAILY  . Insulin Glargine (BASAGLAR KWIKPEN) 100 UNIT/ML SOPN Inject 0.2 mLs (20 Units total) into the skin at bedtime.  . Insulin Pen Needle (PEN NEEDLES 31GX5/16") 31G X 8 MM MISC Use to inject insulin 5 times a day.  . isosorbide mononitrate (IMDUR) 30 MG 24 hr tablet Take 1 tablet (30 mg total) by mouth daily.  . Lancets (ACCU-CHEK MULTICLIX) lancets Use up to 5 times a day to check blood sugar.  . metFORMIN (GLUCOPHAGE) 1000 MG tablet Take 1  tablet (1,000 mg total) by mouth 2 (two) times daily with a meal.  . Nebivolol HCl 20 MG TABS Take 1 tablet (20 mg total) by mouth daily.  [name] omeprazole (PRILOSEC) 20 MG capsule TAKE ONE CAPSULE TWICE A DAY BEFORE A MEAL  . omeprazole (PRILOSEC) 20 MG capsule TAKE ONE CAPSULE TWICE A DAY BEFORE A MEAL  . valsartan-hydrochlorothiazide (DIOVAN-HCT) 80-12.5 MG tablet Take 1 tablet by mouth daily.   Facility-Administered Encounter Medications as of 05/22/2016  Medication  . pneumococcal 13-valent conjugate vaccine (PREVNAR 13) injection 0.5 mL     Review of Systems  Constitutional:   No  weight loss, night sweats,  Fevers, chills, fatigue, or  lassitude.  HEENT:   No headaches,  Difficulty swallowing,  Tooth/dental problems, or  Sore throat,                No sneezing, itching, ear ache, nasal congestion, post nasal drip,   CV:  No chest pain,  Orthopnea, PND, swelling in lower extremities, anasarca, dizziness, palpitations, syncope.   GI  No heartburn, indigestion, abdominal pain, nausea, vomiting, diarrhea, change in bowel habits, loss of appetite, bloody stools.   Resp: No shortness of breath with exertion or at rest.  No excess mucus, no productive cough,  No non-productive cough,  No coughing up of blood.  No change in color of mucus.  No wheezing.  No chest wall deformity  Skin: no rash or lesions.  GU: no dysuria, change in color of urine, no urgency or frequency.  No flank pain, no hematuria   MS + right foot fx /wooden shoe     Physical Exam  BP 132/70 (BP Location: Left Arm, Cuff Size: Normal)   Pulse 82   Ht 6\' 2"  (1.88 m)   Wt 225 lb (102.1 kg)   SpO2 97%   BMI 28.89 kg/m   GEN: A/Ox3; pleasant , NAD,  Elderly    HEENT:  [place]/AT,  EACs-clear, TMs-wnl, NOSE-clear, THROAT-clear, no lesions, no postnasal drip or exudate noted. Class 2- 3 MP airway   NECK:  Supple w/ fair ROM; no JVD; normal carotid impulses w/o bruits; no thyromegaly or nodules palpated; no  lymphadenopathy.    RESP  Clear  P & A; w/o, wheezes/ rales/ or rhonchi. no accessory muscle use, no dullness to percussion  CARD:  RRR, no m/r/g, no peripheral edema, pulses intact, no cyanosis or clubbing.  GI:   Soft & nt; nml bowel sounds; no organomegaly or masses detected.   Musco: Warm bil, wooden shoe on right   Neuro: alert, no focal deficits noted.    Skin: Warm, no lesions or rashes    Lab Results:  CBC    Imaging:   Assessment & Plan:  OSA (obstructive sleep apnea) Poor control on ASV - ? Mask issue contributing to poor control  Set for mask fitting /desensitization   Plan  Patient Instructions  Continue on ASV sleep machine .  Do not drive if sleepy.  Go to sleep center for mask fitting .  Follow up with Dr. Vassie LollAlva  In 6 -8 weeks and As needed           Rubye Oaksammy Fowler Antos, NP 05/22/2016

## 2016-05-22 NOTE — Telephone Encounter (Signed)
New message      Patient calling the office for samples of medication:   1.  What medication and dosage are you requesting samples for? eliquis 5mg  and bystolic 20mg   2.  Are you currently out of this medication? Almost out of medication

## 2016-05-23 NOTE — Telephone Encounter (Signed)
Received call back-wife aware and will pick up samples next week.

## 2016-05-23 NOTE — Telephone Encounter (Signed)
Lmtcb.  No samples of eliquis available.   Medication samples have been provided to the patient.  Drug name: Bystolic 20mg   Qty: 28 LOT: [id number]  Exp.Date: 10/18  Samples left at front desk for patient pick-up.

## 2016-05-24 ENCOUNTER — Emergency Department (HOSPITAL_COMMUNITY): Payer: Medicare Other

## 2016-05-24 ENCOUNTER — Emergency Department (HOSPITAL_COMMUNITY)
Admission: EM | Admit: 2016-05-24 | Discharge: 2016-05-24 | Disposition: A | Discharge status: Home / Self Care | Payer: Medicare Other | Attending: Emergency Medicine | Admitting: Emergency Medicine

## 2016-05-24 ENCOUNTER — Encounter (HOSPITAL_COMMUNITY): Payer: Self-pay | Admitting: Emergency Medicine

## 2016-05-24 DIAGNOSIS — W01190A Fall on same level from slipping, tripping and stumbling with subsequent striking against furniture, initial encounter: Secondary | ICD-10-CM | POA: Insufficient documentation

## 2016-05-24 DIAGNOSIS — Y9301 Activity, walking, marching and hiking: Secondary | ICD-10-CM | POA: Diagnosis not present

## 2016-05-24 DIAGNOSIS — E119 Type 2 diabetes mellitus without complications: Secondary | ICD-10-CM | POA: Diagnosis not present

## 2016-05-24 DIAGNOSIS — Z951 Presence of aortocoronary bypass graft: Secondary | ICD-10-CM | POA: Insufficient documentation

## 2016-05-24 DIAGNOSIS — I10 Essential (primary) hypertension: Secondary | ICD-10-CM | POA: Insufficient documentation

## 2016-05-24 DIAGNOSIS — Z96653 Presence of artificial knee joint, bilateral: Secondary | ICD-10-CM | POA: Diagnosis not present

## 2016-05-24 DIAGNOSIS — Y999 Unspecified external cause status: Secondary | ICD-10-CM | POA: Diagnosis not present

## 2016-05-24 DIAGNOSIS — R930 Abnormal findings on diagnostic imaging of skull and head, not elsewhere classified: Secondary | ICD-10-CM | POA: Diagnosis not present

## 2016-05-24 DIAGNOSIS — Z7901 Long term (current) use of anticoagulants: Secondary | ICD-10-CM | POA: Insufficient documentation

## 2016-05-24 DIAGNOSIS — S0990XA Unspecified injury of head, initial encounter: Secondary | ICD-10-CM | POA: Diagnosis not present

## 2016-05-24 DIAGNOSIS — Z7982 Long term (current) use of aspirin: Secondary | ICD-10-CM | POA: Insufficient documentation

## 2016-05-24 DIAGNOSIS — Z794 Long term (current) use of insulin: Secondary | ICD-10-CM | POA: Diagnosis not present

## 2016-05-24 DIAGNOSIS — Y92009 Unspecified place in unspecified non-institutional (private) residence as the place of occurrence of the external cause: Secondary | ICD-10-CM | POA: Diagnosis not present

## 2016-05-24 DIAGNOSIS — S2231XA Fracture of one rib, right side, initial encounter for closed fracture: Secondary | ICD-10-CM

## 2016-05-24 DIAGNOSIS — Z79899 Other long term (current) drug therapy: Secondary | ICD-10-CM | POA: Insufficient documentation

## 2016-05-24 DIAGNOSIS — R109 Unspecified abdominal pain: Secondary | ICD-10-CM | POA: Diagnosis not present

## 2016-05-24 DIAGNOSIS — R935 Abnormal findings on diagnostic imaging of other abdominal regions, including retroperitoneum: Secondary | ICD-10-CM | POA: Diagnosis not present

## 2016-05-24 DIAGNOSIS — I2510 Atherosclerotic heart disease of native coronary artery without angina pectoris: Secondary | ICD-10-CM | POA: Insufficient documentation

## 2016-05-24 DIAGNOSIS — S3991XA Unspecified injury of abdomen, initial encounter: Secondary | ICD-10-CM | POA: Diagnosis not present

## 2016-05-24 DIAGNOSIS — S301XXA Contusion of abdominal wall, initial encounter: Secondary | ICD-10-CM | POA: Insufficient documentation

## 2016-05-24 LAB — CBC WITH DIFFERENTIAL/PLATELET
Basophils Absolute: 0 10*3/uL (ref 0.0–0.1)
Basophils Relative: 0 %
Eosinophils Absolute: 0.3 10*3/uL (ref 0.0–0.7)
Eosinophils Relative: 5 %
HCT: 38.8 % — ABNORMAL LOW (ref 39.0–52.0)
Hemoglobin: 12.4 g/dL — ABNORMAL LOW (ref 13.0–17.0)
Lymphocytes Relative: 21 %
Lymphs Abs: 1.5 10*3/uL (ref 0.7–4.0)
MCH: 30 pg (ref 26.0–34.0)
MCHC: 32 g/dL (ref 30.0–36.0)
MCV: 93.9 fL (ref 78.0–100.0)
Monocytes Absolute: 0.5 10*3/uL (ref 0.1–1.0)
Monocytes Relative: 7 %
Neutro Abs: 4.9 10*3/uL (ref 1.7–7.7)
Neutrophils Relative %: 67 %
Platelets: 159 10*3/uL (ref 150–400)
RBC: 4.13 MIL/uL — ABNORMAL LOW (ref 4.22–5.81)
RDW: 15.2 % (ref 11.5–15.5)
WBC: 7.3 10*3/uL (ref 4.0–10.5)

## 2016-05-24 LAB — COMPREHENSIVE METABOLIC PANEL
ALT: 17 U/L (ref 17–63)
AST: 20 U/L (ref 15–41)
Albumin: 3.3 g/dL — ABNORMAL LOW (ref 3.5–5.0)
Alkaline Phosphatase: 91 U/L (ref 38–126)
Anion gap: 7 (ref 5–15)
BUN: 16 mg/dL (ref 6–20)
CO2: 27 mmol/L (ref 22–32)
Calcium: 8.8 mg/dL — ABNORMAL LOW (ref 8.9–10.3)
Chloride: 104 mmol/L (ref 101–111)
Creatinine, Ser: 1.15 mg/dL (ref 0.61–1.24)
GFR calc Af Amer: >60 mL/min (ref >60)
GFR calc non Af Amer: >60 mL/min (ref >60)
Glucose, Bld: 123 mg/dL — ABNORMAL HIGH (ref 65–99)
Potassium: 4.1 mmol/L (ref 3.5–5.1)
Sodium: 138 mmol/L (ref 135–145)
Total Bilirubin: 0.8 mg/dL (ref 0.3–1.2)
Total Protein: 6 g/dL — ABNORMAL LOW (ref 6.5–8.1)

## 2016-05-24 LAB — PROTIME-INR
INR: 1.25
Prothrombin Time: 15.8 seconds — ABNORMAL HIGH (ref 11.4–15.2)

## 2016-05-24 LAB — CBG MONITORING, ED: Glucose-Capillary: 58 mg/dL — ABNORMAL LOW (ref 65–99)

## 2016-05-24 LAB — LIPASE, BLOOD: Lipase: 30 U/L (ref 11–51)

## 2016-05-24 MED ORDER — MORPHINE SULFATE (PF) 4 MG/ML IV SOLN
4.0000 mg | Freq: Once | INTRAVENOUS | Status: AC
Start: 2016-05-24 — End: 2016-05-24
  Administered 2016-05-24: 4 mg via INTRAVENOUS
  Filled 2016-05-24: qty 1

## 2016-05-24 MED ORDER — ACETAMINOPHEN 500 MG PO TABS: 1000.0000 mg | ORAL_TABLET | Freq: Four times a day (QID) | ORAL | 0 refills | Status: AC | PRN

## 2016-05-24 MED ORDER — TRAMADOL HCL 50 MG PO TABS: 100.0000 mg | ORAL_TABLET | Freq: Four times a day (QID) | ORAL | 0 refills | Status: DC | PRN

## 2016-05-24 MED ORDER — ACETAMINOPHEN 500 MG PO TABS
1000.0000 mg | ORAL_TABLET | Freq: Once | ORAL | Status: AC
  Administered 2016-05-24: 1000 mg via ORAL
  Filled 2016-05-24: qty 2

## 2016-05-24 MED ORDER — TRAMADOL HCL 50 MG PO TABS
100.0000 mg | ORAL_TABLET | Freq: Once | ORAL | Status: AC
  Administered 2016-05-24: 100 mg via ORAL
  Filled 2016-05-24: qty 2

## 2016-05-24 MED ORDER — IOPAMIDOL (ISOVUE-300) INJECTION 61%
INTRAVENOUS | Status: AC
  Administered 2016-05-24: 100 mL
  Filled 2016-05-24: qty 100

## 2016-05-24 MED ORDER — SODIUM CHLORIDE 0.9 % IV BOLUS (SEPSIS)
1000.0000 mL | Freq: Once | INTRAVENOUS | Status: AC
  Administered 2016-05-24: 1000 mL via INTRAVENOUS

## 2016-05-24 NOTE — Discharge Instructions (Addendum)
Take tylenol for pain.   See your doctor.  Return to ER if you have worse abdominal pain, chest pain, abdominal swelling or bruising

## 2016-05-24 NOTE — ED Triage Notes (Addendum)
Pt states on Monday he was walking through his house and tripped  inside his living room and hit his right side on a coffee table. Pt also states he hit the right side of his head on the corner of the table. Pt denies any LOC. Pt states Abd hurts when coughing.  Pt takes eliqus.

## 2016-05-24 NOTE — ED Provider Notes (Signed)
Septra from Dr. Silverio LayYao at sign out to review CT scans and make disposition. Patient is alert and appropriate. He has no respiratory distress. CT identifies focal right rib fracture nondisplaced. Otherwise no acute findings. Findings reviewed with patient and his wife. They're counseled to follow up with her PCP regarding nonacute, incidental findings on CT.  We discussed pain control for rib fracture. The patient's wife reports that he had Vicodin a couple of years ago and was pretty hyper and agitated when he took that and not really able to sleep. We will try tramadol and Tylenol for pain control. Patient is also counseled to use cool compress on the rib. He is not in any acute distress at rest and has no respiratory distress. They're also consulted on the use of Colace if he should show any signs of constipation.   Luis BarretteMarcy Bakary Bramer, MD 05/24/16 [phone]

## 2016-05-24 NOTE — ED Provider Notes (Signed)
[facility]-EMERGENCY DEPT Provider Note   CSN: [id number] Arrival date & time: 05/24/16  1309     History   Chief Complaint Chief Complaint  Patient presents with  . Fall    HPI Luis Yu is a 71 y.o. male hx of a flutter on Eliquis, CAD, diabetes, HTN, here with fall. 2 days ago, patient tripped and fell and hit his head and abdomen. Denies passing out. Patient states that he has progressive pain in his abdomen. Denies any nausea or vomiting or fevers. Denies any diarrhea. Patient fell couple weeks ago and had a toe fracture and is currently on the postop boot.   The history is provided by the patient.    Past Medical History:  Diagnosis Date  . Adjustment disorder with mixed emotional features   . Atrial flutter (HCC)   . CAD (coronary artery disease)   . Depression   . Diabetes mellitus type II, uncontrolled (HCC)   . Diabetic peripheral neuropathy (HCC)   . Erectile dysfunction   . GERD (gastroesophageal reflux disease)   . Herpes zoster   . Hyperlipidemia   . Hypertension   . Obesity   . Osteoarthritis   . Postherpetic neuralgia   . Sleep apnea     Patient Active Problem List   Diagnosis Date Noted  . Insomnia 02/02/2016  . Atrial fibrillation (HCC) 10/27/2015  . Uncoordinated movements 02/24/2014  . Unspecified hereditary and idiopathic peripheral neuropathy 02/24/2013  . Low back pain 02/24/2013  . Abnormality of gait 02/09/2013  . Memory loss, short term 12/05/2011  . LUMBAR STRAIN, ACUTE 05/05/2010  . OSA (obstructive sleep apnea) 09/14/2009  . BPH (benign prostatic hyperplasia) 07/27/2009  . CARDIOVASCULAR STUDIES, ABNORMAL 07/27/2009  . CHEST PAIN 06/08/2009  . OSTEOARTHRITIS 05/30/2009  . HEEL PAIN, LEFT 05/24/2009  . Dyspnea on exertion 05/24/2009  . ADJUSTMENT DISORDER WITH MIXED FEATURES 08/25/2008  . Diabetes type 2, uncontrolled (HCC) 02/24/2008  . ERECTILE DYSFUNCTION 02/10/2007  . Hyperlipidemia 10/09/2006  . OBESITY 10/09/2006  . Essential  hypertension 10/09/2006  . Coronary atherosclerosis 10/09/2006  . GERD 10/09/2006  . DM (diabetes mellitus) type II uncontrolled with retinopathy 06/10/2006    Past Surgical History:  Procedure Laterality Date  . CARDIOVERSION N/A 11/10/2015   Procedure: CARDIOVERSION;  Surgeon: Laurey Morale, MD;  Location: [facility] ENDOSCOPY;  Service: Cardiovascular;  Laterality: N/A;  . CARDIOVERSION N/A 02/21/2016   Procedure: CARDIOVERSION;  Surgeon: Lewayne Bunting, MD;  Location: [facility] ENDOSCOPY;  Service: Cardiovascular;  Laterality: N/A;  . CORONARY ARTERY BYPASS GRAFT  1996   eith a LIMA to the LAD, saphenous vein graft to the acute marginal, saphenous vein graft to the PDA and saphenous vein graft to the circumflex.  [name] KNEE ARTHROSCOPY  05/2004   right knee  . REPLACEMENT TOTAL KNEE BILATERAL    . SHOULDER SURGERY         Home Medications    Prior to Admission medications   Medication Sig Start Date End Date Taking? Authorizing Provider  apixaban (ELIQUIS) 5 MG TABS tablet Take 1 tablet (5 mg total) by mouth 2 (two) times daily. 11/17/15   Newman Nip, NP  aspirin EC 81 MG tablet Take 1 tablet (81 mg total) by mouth daily. 10/14/15   Brittainy Sherlynn Carbon, PA-C  atorvastatin (LIPITOR) 80 MG tablet Take 1 tablet (80 mg total) by mouth daily. 03/29/16   Shirline Frees, NP  atorvastatin (LIPITOR) 80 MG tablet TAKE 1 TABLET (80 MG TOTAL) BY MOUTH DAILY.  04/11/16   Shirline Frees, NP  buPROPion (WELLBUTRIN SR) 150 MG 12 hr tablet Take 1 tablet (150 mg total) by mouth 2 (two) times daily. 03/29/16   [name] Nafziger, NP  BYSTOLIC 20 MG TABS TAKE 1 TABLET (20 MG TOTAL) BY MOUTH DAILY. 05/16/16   Shirline Frees, NP  glipiZIDE (GLUCOTROL) 10 MG tablet Take 1 tablet (10 mg total) by mouth 2 (two) times daily before a meal. 03/29/16   Shirline Frees, NP  glucose blood (ACCU-CHEK ACTIVE STRIPS) test strip Use as instructed 03/29/16   Shirline Frees, NP  insulin aspart (NOVOLOG FLEXPEN) 100 UNIT/ML FlexPen USE 15 TO 30  UNITS 15 MINUTES BEFORE MEALS 3 TIMES DAILY 03/29/16   Shirline Frees, NP  Insulin Glargine (BASAGLAR KWIKPEN) 100 UNIT/ML SOPN Inject 0.2 mLs (20 Units total) into the skin at bedtime. 03/29/16   Shirline Frees, NP  Insulin Pen Needle (PEN NEEDLES 31GX5/16") 31G X 8 MM MISC Use to inject insulin 5 times a day. 03/29/16   Shirline Frees, NP  isosorbide mononitrate (IMDUR) 30 MG 24 hr tablet Take 1 tablet (30 mg total) by mouth daily. 10/31/15   Rollene Rotunda, MD  Lancets (ACCU-CHEK MULTICLIX) lancets Use up to 5 times a day to check blood sugar. 03/29/16   Shirline Frees, NP  metFORMIN (GLUCOPHAGE) 1000 MG tablet Take 1 tablet (1,000 mg total) by mouth 2 (two) times daily with a meal. 03/29/16   Shirline Frees, NP  Nebivolol HCl 20 MG TABS Take 1 tablet (20 mg total) by mouth daily. 03/29/16   Shirline Frees, NP  omeprazole (PRILOSEC) 20 MG capsule TAKE ONE CAPSULE TWICE A DAY BEFORE A MEAL 03/29/16   Shirline Frees, NP  omeprazole (PRILOSEC) 20 MG capsule TAKE ONE CAPSULE TWICE A DAY BEFORE A MEAL 04/02/16   Shirline Frees, NP  valsartan-hydrochlorothiazide (DIOVAN-HCT) 80-12.5 MG tablet Take 1 tablet by mouth daily. 03/29/16   Shirline Frees, NP    Family History Family History  Problem Relation Age of Onset  . Coronary artery disease Mother     deceased at 17  . Coronary artery disease Father     died age 8  . Stroke      half brother  . Stroke Brother     Social History Social History  Substance Use Topics  . Smoking status: Never Smoker  . Smokeless tobacco: Never Used  . Alcohol use 1.2 oz/week    2 Standard drinks or equivalent per week     Comment: twice a month.     Allergies   Patient has no known allergies.   Review of Systems Review of Systems  Gastrointestinal: Positive for abdominal pain.  All other systems reviewed and are negative.    Physical Exam Updated Vital Signs BP 163/81   Pulse 64   Temp 97.6 F (36.4 C) (Oral)   Resp 16   SpO2 94%   Physical Exam    Constitutional: He is oriented to person, place, and time. He appears well-developed and well-nourished.  HENT:  Head: Normocephalic.  Mouth/Throat: Oropharynx is clear and moist.  ? Small R frontal hematoma, no laceration   Eyes: EOM are normal. Pupils are equal, round, and reactive to light.  Neck: Normal range of motion. Neck supple.  Cardiovascular: Normal rate, regular rhythm and normal heart sounds.   Pulmonary/Chest: Effort normal and breath sounds normal.  Mild tenderness R ribs, no obvious deformity or ecchymosis   Abdominal: Soft.  Mild diffuse tenderness, + firmness. No obvious ecchymosis.  Musculoskeletal: Normal range of motion. He exhibits no edema.  Postop shoe R foot   Neurological: He is alert and oriented to person, place, and time. No cranial nerve deficit. Coordination normal.  Skin: Skin is warm.  Psychiatric: He has a normal mood and affect.  Nursing note and vitals reviewed.    ED Treatments / Results  Labs (all labs ordered are listed, but only abnormal results are displayed) Labs Reviewed  CBC WITH DIFFERENTIAL/PLATELET - Abnormal; Notable for the following:       Result Value   RBC 4.13 (*)    Hemoglobin 12.4 (*)    HCT 38.8 (*)    All other components within normal limits  COMPREHENSIVE METABOLIC PANEL - Abnormal; Notable for the following:    Glucose, Bld 123 (*)    Calcium 8.8 (*)    Total Protein 6.0 (*)    Albumin 3.3 (*)    All other components within normal limits  PROTIME-INR - Abnormal; Notable for the following:    Prothrombin Time 15.8 (*)    All other components within normal limits  LIPASE, BLOOD    EKG  EKG Interpretation None       Radiology Ct Head Wo Contrast  Result Date: 05/24/2016 CLINICAL DATA:  Fall earlier this week with current blood thinner usage EXAM: CT HEAD WITHOUT CONTRAST TECHNIQUE: Contiguous axial images were obtained from the base of the skull through the vertex without intravenous contrast. COMPARISON:   03/02/2016 FINDINGS: Brain: Chronic atrophic and ischemic changes are noted similar to that seen on the prior exam. No findings to suggest acute hemorrhage, acute infarction or space-occupying mass lesion are noted. Vascular: No hyperdense vessel or unexpected calcification. Skull: Normal. Negative for fracture or focal lesion. Sinuses/Orbits: Mucosal changes are noted in the maxillary antra bilaterally. Other: None IMPRESSION: Chronic atrophic and ischemic changes without acute abnormality. Electronically Signed   By: Alcide CleverMark  Lukens M.D.   On: 05/24/2016 14:06    Procedures Procedures (including critical care time)  Medications Ordered in ED Medications  sodium chloride 0.9 % bolus 1,000 mL (1,000 mLs Intravenous New Bag/Given 05/24/16 1442)  morphine 4 MG/ML injection 4 mg (4 mg Intravenous Given 05/24/16 1442)  iopamidol (ISOVUE-300) 61 % injection (100 mLs  Contrast Given 05/24/16 1552)     Initial Impression / Assessment and Plan / ED Course  I have reviewed the triage vital signs and the nursing notes.  Pertinent labs & imaging results that were available during my care of the patient were reviewed by me and considered in my medical decision making (see chart for details).     Luis Yu is a 71 y.o. male here with abdominal pain, headache s/p fall 2 days ago. Patient is on eliquis. Will get labs, CT head, CXR, CT ab/pel.   3:53 PM CT chest/ab/pel still pending. CBC stable. Signed out to Dr. Donnald GarrePfeiffer. Anticipate dc home with CT unremarkable.    Final Clinical Impressions(s) / ED Diagnoses   Final diagnoses:  Contusion of abdominal wall, initial encounter    New Prescriptions New Prescriptions   No medications on file     Charlynne Panderavid Hsienta Yao, MD 05/24/16 1554

## 2016-05-24 NOTE — ED Notes (Signed)
Patients wife states she was behind patient when he fell, states he didn' t hit his head she only saw him hit his side. C/o pain in right lateral rib area states pain is worse with insp;iration and movement.

## 2016-05-24 NOTE — ED Notes (Signed)
Pt stable, ambulatory, states understanding of discharge instructions 

## 2016-05-28 NOTE — Progress Notes (Signed)
Reviewed & agree with plan  

## 2016-05-30 ENCOUNTER — Telehealth: Payer: Self-pay | Admitting: Cardiology

## 2016-05-30 ENCOUNTER — Ambulatory Visit (HOSPITAL_BASED_OUTPATIENT_CLINIC_OR_DEPARTMENT_OTHER): Payer: Medicare Other | Attending: Adult Health | Admitting: Radiology

## 2016-05-30 DIAGNOSIS — G4733 Obstructive sleep apnea (adult) (pediatric): Secondary | ICD-10-CM

## 2016-05-30 NOTE — Telephone Encounter (Signed)
Medication samples have been provided to the patient.  Drug name: eliquis 5mg   Qty: 2 boxes  LOT: [id number]  Exp.Date: 05/2018              1 box            [id number]         05/2018  Patient's wife came in to pick up bystolic samples and requested eliquis samples.

## 2016-06-01 ENCOUNTER — Ambulatory Visit (INDEPENDENT_AMBULATORY_CARE_PROVIDER_SITE_OTHER): Payer: Medicare Other | Admitting: Adult Health

## 2016-06-01 ENCOUNTER — Encounter: Payer: Self-pay | Admitting: Adult Health

## 2016-06-01 VITALS — BP 156/80 | HR 64 | Ht 74.0 in | Wt 224.9 lb

## 2016-06-01 DIAGNOSIS — F339 Major depressive disorder, recurrent, unspecified: Secondary | ICD-10-CM | POA: Diagnosis not present

## 2016-06-01 DIAGNOSIS — S2231XA Fracture of one rib, right side, initial encounter for closed fracture: Secondary | ICD-10-CM

## 2016-06-01 DIAGNOSIS — I2510 Atherosclerotic heart disease of native coronary artery without angina pectoris: Secondary | ICD-10-CM

## 2016-06-01 MED ORDER — CITALOPRAM HYDROBROMIDE 20 MG PO TABS: 20.0000 mg | ORAL_TABLET | Freq: Every day | ORAL | 1 refills | Status: DC

## 2016-06-01 NOTE — Progress Notes (Signed)
Subjective:    Patient ID: Luis Yu, male    DOB: 22-Jan-1946, 71 y.o.   MRN: [id number]  HPI  71 year old male who  has a past medical history of Adjustment disorder with mixed emotional features; Atrial flutter (HCC); CAD (coronary artery disease); Depression; Diabetes mellitus type II, uncontrolled (HCC); Diabetic peripheral neuropathy (HCC); Erectile dysfunction; GERD (gastroesophageal reflux disease); Herpes zoster; Hyperlipidemia; Hypertension; Obesity; Osteoarthritis; Postherpetic neuralgia; and Sleep apnea.   He was seen in the emergency room on 05/24/2016 after sustaining a fall 2 days prior. Per the ER note patient reported tripping and falling in him hitting his head and abdomen. He denied passing out. He endorses progressive pain in his abdomen.  CT of HEAD: IMPRESSION: Chronic atrophic and ischemic changes without acute abnormality.  CT CHEST & ABDOMEN  IMPRESSION: CT CHEST: Acute nondisplaced RIGHT eighth rib fracture and overlying chest wall contusion.  No acute cardiopulmonary process.  Four month stability 3 mm sub solid pulmonary nodules.  CT ABDOMEN AND PELVIS: No acute abdominopelvic process or CT findings of acute trauma.  Severe L4-5 and L5-S1 neural foraminal narrowing, grade 1 L5-S1 anterolisthesis on the basis of chronic pars interarticularis defects.  Severe atherosclerosis.   Day in the office he reports that he continues to have pain in his right flank. In the ER he was prescribed tramadol and Tylenol which he reports does little for his pain.  I see Luis HillDonald on a regular basis and today in the office he appeared to be more sad than he normally is. He endorses feeling as though he has not made any progress over the last 6 months. He has become frustrated and depressed from the amount of testing, trips he has to make to his medical appointments, and overall progress, he especially does not like being on all the medications he is taking. He became  tearful during the exam when he explained this to me. He denies any suicidal ideation   Review of Systems  Past Medical History:  Diagnosis Date  . Adjustment disorder with mixed emotional features   . Atrial flutter (HCC)   . CAD (coronary artery disease)   . Depression   . Diabetes mellitus type II, uncontrolled (HCC)   . Diabetic peripheral neuropathy (HCC)   . Erectile dysfunction   . GERD (gastroesophageal reflux disease)   . Herpes zoster   . Hyperlipidemia   . Hypertension   . Obesity   . Osteoarthritis   . Postherpetic neuralgia   . Sleep apnea     Social History   Social History  . Marital status: Married    Spouse name: [name]  . Number of children: 1  . Years of education: 3313   Occupational History  . Retired Naval architecttruck driver Retired   Social History Main Topics  . Smoking status: Never Smoker  . Smokeless tobacco: Never Used  . Alcohol use 1.2 oz/week    2 Standard drinks or equivalent per week     Comment: twice a month.  . Drug use: No  . Sexual activity: Yes    Partners: Female   Other Topics Concern  . Not on file   Social History Narrative   Married [name]([name])   Regular exercise: none   Caffeine use: cup of coffee daily    Retired Naval architecttruck driver   Caffeine- twice daily             Past Surgical History:  Procedure Laterality Date  . CARDIOVERSION N/A 11/10/2015  Procedure: CARDIOVERSION;  Surgeon: Laurey Morale, MD;  Location: [facility] ENDOSCOPY;  Service: Cardiovascular;  Laterality: N/A;  . CARDIOVERSION N/A 02/21/2016   Procedure: CARDIOVERSION;  Surgeon: Lewayne Bunting, MD;  Location: [facility] ENDOSCOPY;  Service: Cardiovascular;  Laterality: N/A;  . CORONARY ARTERY BYPASS GRAFT  1996   eith a LIMA to the LAD, saphenous vein graft to the acute marginal, saphenous vein graft to the PDA and saphenous vein graft to the circumflex.  [name] KNEE ARTHROSCOPY  05/2004   right knee  . REPLACEMENT TOTAL KNEE BILATERAL    . SHOULDER SURGERY      Family  History  Problem Relation Age of Onset  . Coronary artery disease Mother     deceased at 38  . Coronary artery disease Father     died age 16  . Stroke      half brother  . Stroke Brother     No Known Allergies  Current Outpatient Prescriptions on File Prior to Visit  Medication Sig Dispense Refill  . acetaminophen (TYLENOL) 500 MG tablet Take 2 tablets (1,000 mg total) by mouth every 6 (six) hours as needed. 30 tablet 0  . apixaban (ELIQUIS) 5 MG TABS tablet Take 1 tablet (5 mg total) by mouth 2 (two) times daily. 60 tablet 11  . aspirin EC 81 MG tablet Take 1 tablet (81 mg total) by mouth daily.    [name] atorvastatin (LIPITOR) 80 MG tablet TAKE 1 TABLET (80 MG TOTAL) BY MOUTH DAILY. 90 tablet 3  . buPROPion (WELLBUTRIN SR) 150 MG 12 hr tablet Take 1 tablet (150 mg total) by mouth 2 (two) times daily. 180 tablet 1  . BYSTOLIC 20 MG TABS TAKE 1 TABLET (20 MG TOTAL) BY MOUTH DAILY. 90 tablet 3  . glipiZIDE (GLUCOTROL) 10 MG tablet Take 1 tablet (10 mg total) by mouth 2 (two) times daily before a meal. 180 tablet 3  . glucose blood (ACCU-CHEK ACTIVE STRIPS) test strip Use as instructed 100 each 12  . insulin aspart (NOVOLOG FLEXPEN) 100 UNIT/ML FlexPen USE 15 TO 30 UNITS 15 MINUTES BEFORE MEALS 3 TIMES DAILY 15 pen 6  . Insulin Glargine (BASAGLAR KWIKPEN) 100 UNIT/ML SOPN Inject 0.2 mLs (20 Units total) into the skin at bedtime. 3 pen 11  . Insulin Pen Needle (PEN NEEDLES 31GX5/16") 31G X 8 MM MISC Use to inject insulin 5 times a day. 100 each 11  . isosorbide mononitrate (IMDUR) 30 MG 24 hr tablet Take 1 tablet (30 mg total) by mouth daily. 30 tablet 12  . Lancets (ACCU-CHEK MULTICLIX) lancets Use up to 5 times a day to check blood sugar. 100 each 5  . metFORMIN (GLUCOPHAGE) 1000 MG tablet Take 1 tablet (1,000 mg total) by mouth 2 (two) times daily with a meal. 180 tablet 3  . Nebivolol HCl 20 MG TABS Take 1 tablet (20 mg total) by mouth daily. 90 tablet 3  . omeprazole (PRILOSEC) 20 MG  capsule TAKE ONE CAPSULE TWICE A DAY BEFORE A MEAL 180 capsule 0  . valsartan-hydrochlorothiazide (DIOVAN-HCT) 80-12.5 MG tablet Take 1 tablet by mouth daily. 90 tablet 3   Current Facility-Administered Medications on File Prior to Visit  Medication Dose Route Frequency Provider Last Rate Last Dose  . pneumococcal 13-valent conjugate vaccine (PREVNAR 13) injection 0.5 mL  0.5 mL Intramuscular Tomorrow-1000 Juan Kissoon, NP        BP (!) 156/80 (BP Location: Left Arm, Patient Position: Sitting, Cuff Size: Normal)   Pulse 64  Ht 6\' 2"  (1.88 m)   Wt 224 lb 14.4 oz (102 kg)   SpO2 98%   BMI 28.88 kg/m       Objective:   Physical Exam  Constitutional: He is oriented to person, place, and time. He appears well-developed and well-nourished. No distress.  Cardiovascular: Normal rate, normal heart sounds and intact distal pulses.  An irregularly irregular rhythm present.  Pulmonary/Chest: Effort normal and breath sounds normal. No respiratory distress. He has no wheezes. He has no rales. He exhibits no tenderness.  Musculoskeletal: He exhibits tenderness (Right flank).  He is a surgical boot from a prior toe fracture  Neurological: He is alert and oriented to person, place, and time.  Skin: Skin is warm and dry. No rash noted. He is not diaphoretic. No erythema. No pallor.  Psychiatric: His speech is normal. Judgment and thought content normal. He is slowed and withdrawn. He exhibits a depressed mood.  Nursing note and vitals reviewed.     Assessment & Plan:  1. Closed fracture of one rib of right side, initial encounter - I am not comfortable with prescribing anything stronger than you 30 taken. He had a paradoxical reaction to Vicodin in the past. Main concern for future falls if we prescribe any other narcotic medication. -Continue to use ice, Tylenol, and tramadol. -Encouraged to do breathe deep breathing exercises  2. Depression, recurrent (HCC) - He is frustrated, rightfully so  as he has  been through a lot over the last 6 months. I tried to encourage Caylon induces diabetes management as a way to prove to him that he's made progress in managing his disease process. He has made significant progress in lowering his A1c over the last year. I am going to start him on Celexa 20 mg daily with Wellbutrin. With the goal of weaning him off Wellbutrin. - citalopram (CELEXA) 20 MG tablet; Take 1 tablet (20 mg total) by mouth daily.  Dispense: 90 tablet; Refill: 1 - Would like him to follow-up in one month or sooner if needed - Advised to go directly to the emergency room if he has any thoughts of suicide  Shirline Frees, NP

## 2016-06-01 NOTE — Patient Instructions (Addendum)
I have prescribed Celexa 20 mg for Depression. Take this with Wellbutrin for the time being   Please follow up with me in one month

## 2016-06-06 ENCOUNTER — Encounter: Payer: Self-pay | Admitting: Podiatry

## 2016-06-06 ENCOUNTER — Ambulatory Visit (INDEPENDENT_AMBULATORY_CARE_PROVIDER_SITE_OTHER): Payer: Medicare Other | Admitting: Podiatry

## 2016-06-06 VITALS — Ht 74.0 in | Wt 224.0 lb

## 2016-06-06 DIAGNOSIS — E1140 Type 2 diabetes mellitus with diabetic neuropathy, unspecified: Secondary | ICD-10-CM | POA: Diagnosis not present

## 2016-06-06 DIAGNOSIS — I2510 Atherosclerotic heart disease of native coronary artery without angina pectoris: Secondary | ICD-10-CM

## 2016-06-06 DIAGNOSIS — Z794 Long term (current) use of insulin: Secondary | ICD-10-CM

## 2016-06-06 DIAGNOSIS — S90211A Contusion of right great toe with damage to nail, initial encounter: Secondary | ICD-10-CM

## 2016-06-06 NOTE — Progress Notes (Signed)
This patient returns to the office for scheduled  follow-up for diagnosis of a fracture of the right great toe as well as the nail injury to the right great toe. He says he jammed his big toe this morning and reinjured his right great toe.  He says the nail had healed following the original injury 4 weeks ago.  He presents to the office for evaluation and treatment.  GENERAL APPEARANCE: Alert, conversant. Appropriately groomed. No acute distress.  VASCULAR: Pedal pulses are  palpable at  [facility] bilateral.  Capillary refill time is immediate to all digits,  Normal temperature gradient.   NEUROLOGIC: sensation is diminished to 5.07 monofilament at 5/5 sites bilateral.  Light touch is intact bilateral, Muscle strength normal.  MUSCULOSKELETAL: acceptable muscle strength, tone and stability bilateral.  Intrinsic muscluature intact bilateral.  Rectus appearance of foot and digits noted bilateral.   DERMATOLOGIC: skin color, texture, and turgor are within normal limits.  No preulcerative lesions or ulcers  are seen, no interdigital maceration noted.  No open lesions present.   No drainage noted.  NAIL  the right hallux toenail has dried hematoma noted at the proximal nail fold. There is also a transverse lacerations noted at the base of the medial and lateral nail fold proximally. No evidence of any further lacerations noted nail bed does appear to be healing at this time    Nail injury right hallux nail.  ROV  examination of the right hallux reveals a laceration noted which accounts for the bleeding. Using a Q-tip the blood was cleaned and removed from the proximal nail fold. Neosporin and dry sterile dressing was applied to help to prevent additional bleeding. Patient is soak at home and no antibiotics were prescribed. If this condition worsens or becomes very painful, the patient was told to contact this office or go to the Emergency Department at the hospital.  Helane GuntherGregory Arletta Lumadue DPM  Helane GuntherGregory Naftoli Penny  DPM

## 2016-06-11 ENCOUNTER — Telehealth: Payer: Self-pay | Admitting: Adult Health

## 2016-06-11 NOTE — Telephone Encounter (Signed)
Pt received a call Friday and today.  No voice mail left. Wife would like to make sure we were not trying to call pt.  May have been an automated call.

## 2016-06-12 NOTE — Telephone Encounter (Signed)
I have not attempted to call patient. Per Kandee Keenory, he has not attempted to contact patient either.

## 2016-06-18 ENCOUNTER — Ambulatory Visit (HOSPITAL_COMMUNITY)
Admission: RE | Admit: 2016-06-18 | Discharge: 2016-06-18 | Disposition: A | Discharge status: Home / Self Care | Payer: Medicare Other | Source: Ambulatory Visit | Attending: Nurse Practitioner | Admitting: Nurse Practitioner

## 2016-06-18 ENCOUNTER — Encounter (HOSPITAL_COMMUNITY): Payer: Self-pay | Admitting: Nurse Practitioner

## 2016-06-18 VITALS — BP 140/76 | HR 81 | Ht 74.0 in | Wt 220.4 lb

## 2016-06-18 DIAGNOSIS — Z7982 Long term (current) use of aspirin: Secondary | ICD-10-CM | POA: Insufficient documentation

## 2016-06-18 DIAGNOSIS — I4892 Unspecified atrial flutter: Secondary | ICD-10-CM | POA: Diagnosis not present

## 2016-06-18 DIAGNOSIS — G4730 Sleep apnea, unspecified: Secondary | ICD-10-CM | POA: Diagnosis not present

## 2016-06-18 DIAGNOSIS — Z8249 Family history of ischemic heart disease and other diseases of the circulatory system: Secondary | ICD-10-CM | POA: Insufficient documentation

## 2016-06-18 DIAGNOSIS — M1990 Unspecified osteoarthritis, unspecified site: Secondary | ICD-10-CM | POA: Insufficient documentation

## 2016-06-18 DIAGNOSIS — I4891 Unspecified atrial fibrillation: Secondary | ICD-10-CM | POA: Diagnosis not present

## 2016-06-18 DIAGNOSIS — Z7901 Long term (current) use of anticoagulants: Secondary | ICD-10-CM | POA: Insufficient documentation

## 2016-06-18 DIAGNOSIS — Z823 Family history of stroke: Secondary | ICD-10-CM | POA: Insufficient documentation

## 2016-06-18 DIAGNOSIS — I10 Essential (primary) hypertension: Secondary | ICD-10-CM | POA: Diagnosis not present

## 2016-06-18 DIAGNOSIS — E785 Hyperlipidemia, unspecified: Secondary | ICD-10-CM | POA: Diagnosis not present

## 2016-06-18 DIAGNOSIS — I483 Typical atrial flutter: Secondary | ICD-10-CM | POA: Diagnosis not present

## 2016-06-18 DIAGNOSIS — R0609 Other forms of dyspnea: Secondary | ICD-10-CM | POA: Insufficient documentation

## 2016-06-18 DIAGNOSIS — E1142 Type 2 diabetes mellitus with diabetic polyneuropathy: Secondary | ICD-10-CM | POA: Diagnosis not present

## 2016-06-18 DIAGNOSIS — I2510 Atherosclerotic heart disease of native coronary artery without angina pectoris: Secondary | ICD-10-CM | POA: Insufficient documentation

## 2016-06-18 DIAGNOSIS — Z79899 Other long term (current) drug therapy: Secondary | ICD-10-CM | POA: Insufficient documentation

## 2016-06-18 DIAGNOSIS — F329 Major depressive disorder, single episode, unspecified: Secondary | ICD-10-CM | POA: Diagnosis not present

## 2016-06-18 DIAGNOSIS — K219 Gastro-esophageal reflux disease without esophagitis: Secondary | ICD-10-CM | POA: Insufficient documentation

## 2016-06-18 DIAGNOSIS — Z951 Presence of aortocoronary bypass graft: Secondary | ICD-10-CM | POA: Diagnosis not present

## 2016-06-18 DIAGNOSIS — Z794 Long term (current) use of insulin: Secondary | ICD-10-CM | POA: Diagnosis not present

## 2016-06-18 DIAGNOSIS — Z96653 Presence of artificial knee joint, bilateral: Secondary | ICD-10-CM | POA: Insufficient documentation

## 2016-06-18 NOTE — Progress Notes (Signed)
Primary Care Physician: Shirline Frees, NP Referring Physician: Dr. Burna Forts is a 71 y.o. male with a h/o CAD,s/p CABG, severe sleep apnea, atrial flutter (on eliquis), poorly controlled T2DM on insulin, peripheral neuropathy, HTN, HLD who presents to the afib clinic for evaluation urgently at the request of Theodore Demark, NP, who pt saw earlier today.  CAD history is of the following...cath of 2011 revealed severe stenosis in the proximal and distal body of the saphenous vein graft to the left-sided PDA. There was normal left ventricular systolic function. The patient had PCI with a drug-eluting stent at that time. He had repeat cardiac cath in Dec of 2011 for dyspnea and f/u abnormal myoview. This revealed an EF of 50%, continued patency ofinternal mammary to the LAD, occluded saphenous vein graft  to the distal circumflex/PDA territory and progressive disease in the saphenous vein graft to the OM. The patient was evaluated by CVTS. Distal vessels werefelt to be poor targets. It was decided that CABG could be considered in the future if PCI options were exhausted. The patient had PCI of the saphenous vein graft to the obtuse marginal. He was last seen by Dene Gentry in late July after being admitted to [facility] in May.  He was told that he had a heart attack but was managed medically.  He was in urgent care after this for SOB. EKG showed new atrial flutter. EKG on last appt demonstrated atrial flutter.  A monitor that  was placed showed persistent  atrial flutter.  At that appt the Plavix was stopped and Eliquis was started.  A stress test was recommended. The patient had no ischemia. He was set up for cardioversion 8/24 and was seen back in the afib clinic 8/31 and was found to be in SR.    He has had onging progressive shortness of breath and cough has seen many providers since cardioversion. He is thought to have central sleep apnea and just had a repeat sleep  study but did not sleep well enough to obtain study. Only one EKG was done in pulmonary over the last few months and it was read as SR but definite flutter waves were present. HR has been reported in the 60-70's on office visits. CT of chest yesterday showed small left and right pleural effusions with BNP of 247. Does not appear fluid overloaded but definitely has exertional dyspnea with walking a very short distance (10-15 feet) Echo yesterday reveals normal EF.  He returns from [facility] 12/11 and is in NSR. His dyspnea has improved over the last few days and he can walk a greater distance form previous. He is still pending a sleep study for central apnea.   He was seen by Dr. Johney Frame 1/29 and was in afib with rate control. He continued to have chronic profound dyspnea of unclear etiology. He had these same symptoms even when he was in SR. He recommended  rate control and to be seen by pulmonary for evaluation. He was seen by pulmonary and no specific reason for dyspnea was determined other than recommendation for full treatment of his sleep apnea, his deconditioning is also felt to be contributing to dyspnea. Dr. Johney Frame did mention that tikosyn could be tried, but questioned long term ability to maintain SR due to left atrial size of 54 mm. He has felt better since starting ASV for cental apnea, less shortness of breath but his daily activities are still limited by exertional dyspnea.  F/u  afib clinic, pt remains in rate controlled a flutter. He is able to be exertional without as much shortness of breath. He however, according to this wife is very deconditioned, very sedentary. He had a cut on his big toe, with bleeding requiring a ER visit, not sure how it happened because the wife left pt in the chair and a few minutes later, he was still in  The chair with his toe bleeding and was cut on both sides. He then a few weeks later, slid out of bed and broke a rib. We did discuss tikosyn which Dr. Johney Frame mentioned  was a possibility, but was he was doubtful if it could return pt to SR and/or keep him there.Wife did check on price and it would be too expensive for him to use. He is treating his sleep apnea but struggling with a proper face mask.  Today, he c/o symptoms of  exertional dyspnea associated with dizziness, improved since staring ASV . No orthopnea, PND, lower extremity edema,   presyncope, syncope, or neurologic sequela. The patient is tolerating medications without difficulties and is otherwise without complaint today.   Past Medical History:  Diagnosis Date  . Adjustment disorder with mixed emotional features   . Atrial flutter (HCC)   . CAD (coronary artery disease)   . Depression   . Diabetes mellitus type II, uncontrolled (HCC)   . Diabetic peripheral neuropathy (HCC)   . Erectile dysfunction   . GERD (gastroesophageal reflux disease)   . Herpes zoster   . Hyperlipidemia   . Hypertension   . Obesity   . Osteoarthritis   . Postherpetic neuralgia   . Sleep apnea    Past Surgical History:  Procedure Laterality Date  . CARDIOVERSION N/A 11/10/2015   Procedure: CARDIOVERSION;  Surgeon: Laurey Morale, MD;  Location: [facility] ENDOSCOPY;  Service: Cardiovascular;  Laterality: N/A;  . CARDIOVERSION N/A 02/21/2016   Procedure: CARDIOVERSION;  Surgeon: Lewayne Bunting, MD;  Location: [facility] ENDOSCOPY;  Service: Cardiovascular;  Laterality: N/A;  . CORONARY ARTERY BYPASS GRAFT  1996   eith a LIMA to the LAD, saphenous vein graft to the acute marginal, saphenous vein graft to the PDA and saphenous vein graft to the circumflex.  [name] KNEE ARTHROSCOPY  05/2004   right knee  . REPLACEMENT TOTAL KNEE BILATERAL    . SHOULDER SURGERY      Current Outpatient Prescriptions  Medication Sig Dispense Refill  . acetaminophen (TYLENOL) 500 MG tablet Take 2 tablets (1,000 mg total) by mouth every 6 (six) hours as needed. 30 tablet 0  . apixaban (ELIQUIS) 5 MG TABS tablet Take 1 tablet (5 mg total) by mouth 2  (two) times daily. 60 tablet 11  . aspirin EC 81 MG tablet Take 1 tablet (81 mg total) by mouth daily.    [name] atorvastatin (LIPITOR) 80 MG tablet TAKE 1 TABLET (80 MG TOTAL) BY MOUTH DAILY. 90 tablet 3  . buPROPion (WELLBUTRIN SR) 150 MG 12 hr tablet Take 1 tablet (150 mg total) by mouth 2 (two) times daily. 180 tablet 1  . BYSTOLIC 20 MG TABS TAKE 1 TABLET (20 MG TOTAL) BY MOUTH DAILY. 90 tablet 3  . citalopram (CELEXA) 20 MG tablet Take 1 tablet (20 mg total) by mouth daily. 90 tablet 1  . glipiZIDE (GLUCOTROL) 10 MG tablet Take 1 tablet (10 mg total) by mouth 2 (two) times daily before a meal. 180 tablet 3  . glucose blood (ACCU-CHEK ACTIVE STRIPS) test strip Use as instructed  100 each 12  . insulin aspart (NOVOLOG FLEXPEN) 100 UNIT/ML FlexPen USE 15 TO 30 UNITS 15 MINUTES BEFORE MEALS 3 TIMES DAILY 15 pen 6  . Insulin Glargine (BASAGLAR KWIKPEN) 100 UNIT/ML SOPN Inject 0.2 mLs (20 Units total) into the skin at bedtime. 3 pen 11  . Insulin Pen Needle (PEN NEEDLES 31GX5/16") 31G X 8 MM MISC Use to inject insulin 5 times a day. 100 each 11  . isosorbide mononitrate (IMDUR) 30 MG 24 hr tablet Take 1 tablet (30 mg total) by mouth daily. 30 tablet 12  . Lancets (ACCU-CHEK MULTICLIX) lancets Use up to 5 times a day to check blood sugar. 100 each 5  . metFORMIN (GLUCOPHAGE) 1000 MG tablet Take 1 tablet (1,000 mg total) by mouth 2 (two) times daily with a meal. 180 tablet 3  . Nebivolol HCl 20 MG TABS Take 1 tablet (20 mg total) by mouth daily. 90 tablet 3  . omeprazole (PRILOSEC) 20 MG capsule TAKE ONE CAPSULE TWICE A DAY BEFORE A MEAL 180 capsule 0  . valsartan-hydrochlorothiazide (DIOVAN-HCT) 80-12.5 MG tablet Take 1 tablet by mouth daily. 90 tablet 3   Current Facility-Administered Medications  Medication Dose Route Frequency Provider Last Rate Last Dose  . pneumococcal 13-valent conjugate vaccine (PREVNAR 13) injection 0.5 mL  0.5 mL Intramuscular Tomorrow-1000 Shirline Frees, NP        No Known  Allergies  Social History   Social History  . Marital status: Married    Spouse name: [name]  . Number of children: 1  . Years of education: 27   Occupational History  . Retired Naval architect Retired   Social History Main Topics  . Smoking status: Never Smoker  . Smokeless tobacco: Never Used  . Alcohol use 1.2 oz/week    2 Standard drinks or equivalent per week     Comment: twice a month.  . Drug use: No  . Sexual activity: Yes    Partners: Female   Other Topics Concern  . Not on file   Social History Narrative   Married [name])   Regular exercise: none   Caffeine use: cup of coffee daily    Retired Naval architect   Caffeine- twice daily             Family History  Problem Relation Age of Onset  . Coronary artery disease Mother     deceased at 52  . Coronary artery disease Father     died age 44  . Stroke      half brother  . Stroke Brother     ROS- All systems are reviewed and negative except as per the HPI above  Physical Exam: Vitals:   06/18/16 1408  BP: 140/76  Pulse: 81  Weight: 220 lb 6.4 oz (100 kg)  Height:  (1.88 m)   Wt Readings from Last 3 Encounters:  06/18/16 220 lb 6.4 oz (100 kg)  06/06/16 224 lb (101.6 kg)  06/01/16 224 lb 14.4 oz (102 kg)    Labs: Lab Results  Component Value Date   NA 138 05/24/2016   K 4.1 05/24/2016   CL 104 05/24/2016   CO2 27 05/24/2016   GLUCOSE 123 (H) 05/24/2016   BUN 16 05/24/2016   CREATININE 1.15 05/24/2016   CALCIUM 8.8 (L) 05/24/2016   Lab Results  Component Value Date   INR 1.25 05/24/2016   Lab Results  Component Value Date   CHOL 142 05/13/2015   HDL 54 05/13/2015  LDLCALC 73 05/13/2015   TRIG 77 05/13/2015  Pulse Ox 95%   GEN- The patient is well appearing, alert and oriented x 3 today.   Head- normocephalic, atraumatic Eyes-  Sclera clear, conjunctiva pink Ears- hearing intact Oropharynx- clear Neck- supple, no JVP Lymph- no cervical lymphadenopathy Lungs- Clear to  ausculation bilaterally, normal work of breathing Heart-  irregular rate and rhythm, no murmurs, rubs or gallops, PMI not laterally displaced GI- soft, NT, ND, + BS Extremities- no clubbing, cyanosis MS- no significant deformity or atrophy Skin- no rash or lesion Psych- euthymic mood, full affect Neuro- strength and sensation are intact   EKG- atrial flutter at 81 bpm, qrs int 96 ms, qtc 443 ms  CXR- 11/29-Cardiac shadow is stable. Postsurgical changes are again seen. The lungs are well aerated bilaterally. No focal infiltrate or sizable effusion is seen. No acute bony abnormality is noted.  IMPRESSION: No acute abnormality seen.  Echo- 11/30-Study Conclusions  - Left ventricle: The cavity size was mildly dilated. Wall   thickness was increased in a pattern of mild LVH. Systolic   function was normal. The estimated ejection fraction was in the   range of 60% to 65%. The study is not technically sufficient to   allow evaluation of LV diastolic function. - Aortic valve: AV is thickened, calcified with minimally   restricted motion. - Mitral valve: Moderately calcified annulus. Mildly thickened   leaflets . There was mild regurgitation. - Left atrium: The atrium was moderately dilated. - Right atrium: The atrium was moderately dilated.  CT of chest-11/30- IMPRESSION: No acute pulmonary embolus. Trace left and small right pleural effusions.  Calcified granulomata in the right upper and right middle lobes.  Noncalcified 3 mm right middle lobe nodular density noted previously appears to be related to a tortuous vessel as are previously noted densities in the right upper lobe.  Holter monitor- 8/22-Persistent atrial flutter with variable rate. Premature ectopic complexes.  Of note there is conduction with aberrancy when his rate is faster.     Assessment and Plan: 1.Typical aflutter/afib Exertional dyspnea has improved with ASV  Exertional dyspnea is most likely  multifactorial Dr. Johney Frame  tentatively mentioned tikosyn but his wife does not feel they could afford  May or may not be able to restore SR and may or may not improve symptoms Continue Eliquis Continue f/u with pulmonology for treatment of central sleep apnea  f/u with Dr. Jens Som 5/8 afib clinic as needed   Lupita Leash C. [facility] [address] [place], [place] [address] [phone]

## 2016-06-21 ENCOUNTER — Encounter: Payer: Self-pay | Admitting: Podiatry

## 2016-06-21 ENCOUNTER — Ambulatory Visit (INDEPENDENT_AMBULATORY_CARE_PROVIDER_SITE_OTHER): Payer: Medicare Other | Admitting: Podiatry

## 2016-06-21 DIAGNOSIS — Z794 Long term (current) use of insulin: Secondary | ICD-10-CM

## 2016-06-21 DIAGNOSIS — S90211A Contusion of right great toe with damage to nail, initial encounter: Secondary | ICD-10-CM | POA: Diagnosis not present

## 2016-06-21 DIAGNOSIS — W450XXD Nail entering through skin, subsequent encounter: Secondary | ICD-10-CM | POA: Diagnosis not present

## 2016-06-21 DIAGNOSIS — E1140 Type 2 diabetes mellitus with diabetic neuropathy, unspecified: Secondary | ICD-10-CM

## 2016-06-21 DIAGNOSIS — I2510 Atherosclerotic heart disease of native coronary artery without angina pectoris: Secondary | ICD-10-CM | POA: Diagnosis not present

## 2016-06-21 NOTE — Progress Notes (Addendum)
This patient returns to the office for scheduled  follow-up for diagnosis of a fracture of the right great toe as well as the nail injury to the right nail matrix.He says the toe is healing and he has no pain.  He denies drainage.  He presents with bandage right big toe.  GENERAL APPEARANCE: Alert, conversant. Appropriately groomed. No acute distress.  VASCULAR: Pedal pulses are  palpable at  [facility] and PT bilateral.  Capillary refill time is immediate to all digits,  Normal temperature gradient.   NEUROLOGIC: sensation is diminished to 5.07 monofilament at 5/5 sites bilateral.  Light touch is intact bilateral, Muscle strength normal.  MUSCULOSKELETAL: acceptable muscle strength, tone and stability bilateral.  Intrinsic muscluature intact bilateral.  Rectus appearance of foot and digits noted bilateral.   DERMATOLOGIC: skin color, texture, and turgor are within normal limits.  No preulcerative lesions or ulcers  are seen, no interdigital maceration noted.  No open lesions present.   No drainage noted. Hallux limitus 1st MPJ  Right foot.  NAIL  the right hallux toenail has dried hematoma noted at the proximal nail fold. There is also a transverse lacerations noted at the base of the medial and lateral nail fold proximally. No evidence of any further lacerations noted nail bed does appear to be healing at this time    Nail injury right hallux nail. Diabetic neuropathy  Hallux limitus 1st MPJ  Right    ROV  Patient is healing .  The nail bed is whitened compared to the right hallux.  This area is cold.  Nail injury is healing.  No pain hallux at fracture site. Initiate diabetic shoe paperwork..  After talking with the pedorthist he will now be examined to see if he is a candidate for braces to prevent falling.   Helane Gunther DPM

## 2016-06-27 ENCOUNTER — Emergency Department (HOSPITAL_COMMUNITY): Payer: Medicare Other

## 2016-06-27 ENCOUNTER — Other Ambulatory Visit: Payer: Medicare Other

## 2016-06-27 ENCOUNTER — Encounter (HOSPITAL_COMMUNITY): Payer: Self-pay | Admitting: Emergency Medicine

## 2016-06-27 ENCOUNTER — Encounter (HOSPITAL_COMMUNITY): Payer: Self-pay

## 2016-06-27 ENCOUNTER — Emergency Department (HOSPITAL_COMMUNITY)
Admission: EM | Admit: 2016-06-27 | Discharge: 2016-06-27 | Disposition: A | Discharge status: Home / Self Care | Payer: Medicare Other | Source: Home / Self Care | Attending: Emergency Medicine | Admitting: Emergency Medicine

## 2016-06-27 ENCOUNTER — Ambulatory Visit (INDEPENDENT_AMBULATORY_CARE_PROVIDER_SITE_OTHER): Payer: Medicare Other | Admitting: Adult Health

## 2016-06-27 ENCOUNTER — Inpatient Hospital Stay (HOSPITAL_COMMUNITY)
Admission: EM | Admit: 2016-06-27 | Discharge: 2016-07-02 | DRG: 065 | Disposition: A | Discharge status: Rehabilitation Facility | Payer: Medicare Other | Attending: Internal Medicine | Admitting: Internal Medicine

## 2016-06-27 DIAGNOSIS — I639 Cerebral infarction, unspecified: Secondary | ICD-10-CM | POA: Diagnosis not present

## 2016-06-27 DIAGNOSIS — Y939 Activity, unspecified: Secondary | ICD-10-CM

## 2016-06-27 DIAGNOSIS — E1139 Type 2 diabetes mellitus with other diabetic ophthalmic complication: Secondary | ICD-10-CM | POA: Diagnosis present

## 2016-06-27 DIAGNOSIS — N179 Acute kidney failure, unspecified: Secondary | ICD-10-CM

## 2016-06-27 DIAGNOSIS — Z760 Encounter for issue of repeat prescription: Secondary | ICD-10-CM

## 2016-06-27 DIAGNOSIS — R112 Nausea with vomiting, unspecified: Secondary | ICD-10-CM

## 2016-06-27 DIAGNOSIS — R260 Ataxic gait: Secondary | ICD-10-CM | POA: Diagnosis present

## 2016-06-27 DIAGNOSIS — R05 Cough: Secondary | ICD-10-CM | POA: Diagnosis not present

## 2016-06-27 DIAGNOSIS — R29700 NIHSS score 0: Secondary | ICD-10-CM | POA: Diagnosis present

## 2016-06-27 DIAGNOSIS — E1165 Type 2 diabetes mellitus with hyperglycemia: Secondary | ICD-10-CM | POA: Diagnosis present

## 2016-06-27 DIAGNOSIS — E11319 Type 2 diabetes mellitus with unspecified diabetic retinopathy without macular edema: Secondary | ICD-10-CM | POA: Diagnosis present

## 2016-06-27 DIAGNOSIS — I482 Chronic atrial fibrillation, unspecified: Secondary | ICD-10-CM

## 2016-06-27 DIAGNOSIS — Z7901 Long term (current) use of anticoagulants: Secondary | ICD-10-CM

## 2016-06-27 DIAGNOSIS — Y999 Unspecified external cause status: Secondary | ICD-10-CM

## 2016-06-27 DIAGNOSIS — Z794 Long term (current) use of insulin: Secondary | ICD-10-CM

## 2016-06-27 DIAGNOSIS — M47812 Spondylosis without myelopathy or radiculopathy, cervical region: Secondary | ICD-10-CM | POA: Diagnosis present

## 2016-06-27 DIAGNOSIS — R269 Unspecified abnormalities of gait and mobility: Secondary | ICD-10-CM

## 2016-06-27 DIAGNOSIS — Z79899 Other long term (current) drug therapy: Secondary | ICD-10-CM

## 2016-06-27 DIAGNOSIS — I951 Orthostatic hypotension: Secondary | ICD-10-CM | POA: Diagnosis present

## 2016-06-27 DIAGNOSIS — A084 Viral intestinal infection, unspecified: Secondary | ICD-10-CM | POA: Diagnosis present

## 2016-06-27 DIAGNOSIS — W010XXA Fall on same level from slipping, tripping and stumbling without subsequent striking against object, initial encounter: Secondary | ICD-10-CM | POA: Insufficient documentation

## 2016-06-27 DIAGNOSIS — I2510 Atherosclerotic heart disease of native coronary artery without angina pectoris: Secondary | ICD-10-CM | POA: Diagnosis present

## 2016-06-27 DIAGNOSIS — E1122 Type 2 diabetes mellitus with diabetic chronic kidney disease: Secondary | ICD-10-CM | POA: Diagnosis present

## 2016-06-27 DIAGNOSIS — K219 Gastro-esophageal reflux disease without esophagitis: Secondary | ICD-10-CM | POA: Diagnosis present

## 2016-06-27 DIAGNOSIS — M4802 Spinal stenosis, cervical region: Secondary | ICD-10-CM | POA: Diagnosis present

## 2016-06-27 DIAGNOSIS — Z6828 Body mass index (BMI) 28.0-28.9, adult: Secondary | ICD-10-CM

## 2016-06-27 DIAGNOSIS — Z8249 Family history of ischemic heart disease and other diseases of the circulatory system: Secondary | ICD-10-CM

## 2016-06-27 DIAGNOSIS — N189 Chronic kidney disease, unspecified: Secondary | ICD-10-CM

## 2016-06-27 DIAGNOSIS — I480 Paroxysmal atrial fibrillation: Secondary | ICD-10-CM | POA: Diagnosis present

## 2016-06-27 DIAGNOSIS — N183 Chronic kidney disease, stage 3 (moderate): Secondary | ICD-10-CM | POA: Diagnosis not present

## 2016-06-27 DIAGNOSIS — W19XXXA Unspecified fall, initial encounter: Secondary | ICD-10-CM

## 2016-06-27 DIAGNOSIS — G4733 Obstructive sleep apnea (adult) (pediatric): Secondary | ICD-10-CM | POA: Diagnosis present

## 2016-06-27 DIAGNOSIS — R197 Diarrhea, unspecified: Secondary | ICD-10-CM

## 2016-06-27 DIAGNOSIS — R2681 Unsteadiness on feet: Secondary | ICD-10-CM

## 2016-06-27 DIAGNOSIS — E1140 Type 2 diabetes mellitus with diabetic neuropathy, unspecified: Secondary | ICD-10-CM

## 2016-06-27 DIAGNOSIS — D696 Thrombocytopenia, unspecified: Secondary | ICD-10-CM | POA: Diagnosis present

## 2016-06-27 DIAGNOSIS — I4891 Unspecified atrial fibrillation: Secondary | ICD-10-CM | POA: Diagnosis present

## 2016-06-27 DIAGNOSIS — E119 Type 2 diabetes mellitus without complications: Secondary | ICD-10-CM

## 2016-06-27 DIAGNOSIS — Z7982 Long term (current) use of aspirin: Secondary | ICD-10-CM | POA: Insufficient documentation

## 2016-06-27 DIAGNOSIS — I10 Essential (primary) hypertension: Secondary | ICD-10-CM | POA: Diagnosis present

## 2016-06-27 DIAGNOSIS — R001 Bradycardia, unspecified: Secondary | ICD-10-CM | POA: Diagnosis present

## 2016-06-27 DIAGNOSIS — Z96653 Presence of artificial knee joint, bilateral: Secondary | ICD-10-CM | POA: Diagnosis present

## 2016-06-27 DIAGNOSIS — E1142 Type 2 diabetes mellitus with diabetic polyneuropathy: Secondary | ICD-10-CM | POA: Diagnosis present

## 2016-06-27 DIAGNOSIS — Y929 Unspecified place or not applicable: Secondary | ICD-10-CM | POA: Insufficient documentation

## 2016-06-27 DIAGNOSIS — R0602 Shortness of breath: Secondary | ICD-10-CM

## 2016-06-27 DIAGNOSIS — F329 Major depressive disorder, single episode, unspecified: Secondary | ICD-10-CM | POA: Diagnosis present

## 2016-06-27 DIAGNOSIS — E860 Dehydration: Secondary | ICD-10-CM | POA: Diagnosis present

## 2016-06-27 DIAGNOSIS — F419 Anxiety disorder, unspecified: Secondary | ICD-10-CM

## 2016-06-27 DIAGNOSIS — S0990XA Unspecified injury of head, initial encounter: Secondary | ICD-10-CM

## 2016-06-27 DIAGNOSIS — R079 Chest pain, unspecified: Secondary | ICD-10-CM | POA: Diagnosis not present

## 2016-06-27 DIAGNOSIS — E785 Hyperlipidemia, unspecified: Secondary | ICD-10-CM | POA: Diagnosis present

## 2016-06-27 DIAGNOSIS — IMO0002 Reserved for concepts with insufficient information to code with codable children: Secondary | ICD-10-CM

## 2016-06-27 DIAGNOSIS — M6281 Muscle weakness (generalized): Secondary | ICD-10-CM | POA: Diagnosis present

## 2016-06-27 DIAGNOSIS — D631 Anemia in chronic kidney disease: Secondary | ICD-10-CM | POA: Diagnosis present

## 2016-06-27 DIAGNOSIS — I129 Hypertensive chronic kidney disease with stage 1 through stage 4 chronic kidney disease, or unspecified chronic kidney disease: Secondary | ICD-10-CM | POA: Diagnosis present

## 2016-06-27 DIAGNOSIS — I25810 Atherosclerosis of coronary artery bypass graft(s) without angina pectoris: Secondary | ICD-10-CM

## 2016-06-27 DIAGNOSIS — E1151 Type 2 diabetes mellitus with diabetic peripheral angiopathy without gangrene: Secondary | ICD-10-CM | POA: Diagnosis present

## 2016-06-27 DIAGNOSIS — S199XXA Unspecified injury of neck, initial encounter: Secondary | ICD-10-CM | POA: Diagnosis not present

## 2016-06-27 DIAGNOSIS — F418 Other specified anxiety disorders: Secondary | ICD-10-CM

## 2016-06-27 DIAGNOSIS — R627 Adult failure to thrive: Secondary | ICD-10-CM | POA: Diagnosis present

## 2016-06-27 DIAGNOSIS — Z951 Presence of aortocoronary bypass graft: Secondary | ICD-10-CM

## 2016-06-27 DIAGNOSIS — E669 Obesity, unspecified: Secondary | ICD-10-CM | POA: Diagnosis present

## 2016-06-27 DIAGNOSIS — Z9181 History of falling: Secondary | ICD-10-CM

## 2016-06-27 DIAGNOSIS — Z823 Family history of stroke: Secondary | ICD-10-CM

## 2016-06-27 DIAGNOSIS — R634 Abnormal weight loss: Secondary | ICD-10-CM | POA: Diagnosis present

## 2016-06-27 LAB — COMPREHENSIVE METABOLIC PANEL
ALT: 21 U/L (ref 17–63)
AST: 30 U/L (ref 15–41)
Albumin: 3.8 g/dL (ref 3.5–5.0)
Alkaline Phosphatase: 102 U/L (ref 38–126)
Anion gap: 12 (ref 5–15)
BUN: 30 mg/dL — ABNORMAL HIGH (ref 6–20)
CO2: 24 mmol/L (ref 22–32)
Calcium: 8.7 mg/dL — ABNORMAL LOW (ref 8.9–10.3)
Chloride: 102 mmol/L (ref 101–111)
Creatinine, Ser: 1.77 mg/dL — ABNORMAL HIGH (ref 0.61–1.24)
GFR calc Af Amer: 42 mL/min — ABNORMAL LOW (ref >60)
GFR calc non Af Amer: 37 mL/min — ABNORMAL LOW (ref >60)
Glucose, Bld: 177 mg/dL — ABNORMAL HIGH (ref 65–99)
Potassium: 4.5 mmol/L (ref 3.5–5.1)
Sodium: 138 mmol/L (ref 135–145)
Total Bilirubin: 1.1 mg/dL (ref 0.3–1.2)
Total Protein: 6.4 g/dL — ABNORMAL LOW (ref 6.5–8.1)

## 2016-06-27 LAB — CBC WITH DIFFERENTIAL/PLATELET
Basophils Absolute: 0 10*3/uL (ref 0.0–0.1)
Basophils Relative: 0 %
Eosinophils Absolute: 0.1 10*3/uL (ref 0.0–0.7)
Eosinophils Relative: 1 %
HCT: 43.4 % (ref 39.0–52.0)
Hemoglobin: 13.7 g/dL (ref 13.0–17.0)
Lymphocytes Relative: 8 %
Lymphs Abs: 0.7 10*3/uL (ref 0.7–4.0)
MCH: 29.3 pg (ref 26.0–34.0)
MCHC: 31.6 g/dL (ref 30.0–36.0)
MCV: 92.7 fL (ref 78.0–100.0)
Monocytes Absolute: 0.6 10*3/uL (ref 0.1–1.0)
Monocytes Relative: 6 %
Neutro Abs: 8.4 10*3/uL — ABNORMAL HIGH (ref 1.7–7.7)
Neutrophils Relative %: 85 %
Platelets: 136 10*3/uL — ABNORMAL LOW (ref 150–400)
RBC: 4.68 MIL/uL (ref 4.22–5.81)
RDW: 15.7 % — ABNORMAL HIGH (ref 11.5–15.5)
WBC: 9.8 10*3/uL (ref 4.0–10.5)

## 2016-06-27 LAB — CBG MONITORING, ED: Glucose-Capillary: 163 mg/dL — ABNORMAL HIGH (ref 65–99)

## 2016-06-27 LAB — GLUCOSE, POCT (MANUAL RESULT ENTRY): POC Glucose: 202 mg/dl — AB (ref 70–99)

## 2016-06-27 MED ORDER — SODIUM CHLORIDE 0.9 % IV BOLUS (SEPSIS)
500.0000 mL | Freq: Once | INTRAVENOUS | Status: AC
  Administered 2016-06-27: 500 mL via INTRAVENOUS

## 2016-06-27 MED ORDER — ONDANSETRON HCL 4 MG PO TABS: 4.0000 mg | ORAL_TABLET | Freq: Four times a day (QID) | ORAL | 0 refills | Status: DC | PRN

## 2016-06-27 MED ORDER — SODIUM CHLORIDE 0.9 % IV BOLUS (SEPSIS)
1000.0000 mL | Freq: Once | INTRAVENOUS | Status: AC
  Administered 2016-06-27: 1000 mL via INTRAVENOUS

## 2016-06-27 MED ORDER — ACETAMINOPHEN 500 MG PO TABS
1000.0000 mg | ORAL_TABLET | Freq: Once | ORAL | Status: DC
  Filled 2016-06-27: qty 2

## 2016-06-27 MED ORDER — ONDANSETRON HCL 4 MG/2ML IJ SOLN
4.0000 mg | Freq: Once | INTRAMUSCULAR | Status: AC
  Administered 2016-06-27: 4 mg via INTRAVENOUS
  Filled 2016-06-27: qty 2

## 2016-06-27 MED ORDER — ONDANSETRON 4 MG PO TBDP
4.0000 mg | ORAL_TABLET | Freq: Once | ORAL | Status: AC
  Administered 2016-06-27: 4 mg via ORAL
  Filled 2016-06-27: qty 1

## 2016-06-27 NOTE — ED Notes (Signed)
[place]: [phone]

## 2016-06-27 NOTE — ED Notes (Signed)
Pts CBG was 163, notified Nicole(RN)

## 2016-06-27 NOTE — ED Provider Notes (Signed)
[facility]-EMERGENCY DEPT Provider Note   CSN: [id number] Arrival date & time: 06/27/16  1526     History   Chief Complaint Chief Complaint  Patient presents with  . Fall    HPI Luis Yu is a 71 y.o. male.  The history is provided by the patient.  Fall  This is a new problem. The current episode started 1 to 2 hours ago. The problem occurs rarely. The problem has been resolved. Pertinent negatives include no chest pain, no abdominal pain, no headaches and no shortness of breath. Nothing aggravates the symptoms. Nothing relieves the symptoms. He has tried nothing for the symptoms.     71 year old male who presents after mechanical fall. History of atrial fibrillation on Eliquis. Yesterday evening with n/v/d. Seeing doctor today in clinic for check up. Tripped on the curb and fell backwards hitting head. No LOC, n/v, confusion, focal numbness or weakness. No abdominal pain, fever or chills. No urinary complaints. Wife sick at home just before him with n/v/d, now resolved. States he was able to eat peanut butter jelly sandwich prior to arrival. No vomiting or diarrhea since last night.   Past Medical History:  Diagnosis Date  . Adjustment disorder with mixed emotional features   . Atrial flutter (HCC)   . CAD (coronary artery disease)   . Depression   . Diabetes mellitus type II, uncontrolled (HCC)   . Diabetic peripheral neuropathy (HCC)   . Erectile dysfunction   . GERD (gastroesophageal reflux disease)   . Herpes zoster   . Hyperlipidemia   . Hypertension   . Obesity   . Osteoarthritis   . Postherpetic neuralgia   . Sleep apnea     Patient Active Problem List   Diagnosis Date Noted  . Insomnia 02/02/2016  . Atrial fibrillation (HCC) 10/27/2015  . Uncoordinated movements 02/24/2014  . Unspecified hereditary and idiopathic peripheral neuropathy 02/24/2013  . Low back pain 02/24/2013  . Abnormality of gait 02/09/2013  . Memory loss, short term 12/05/2011  . LUMBAR  STRAIN, ACUTE 05/05/2010  . OSA (obstructive sleep apnea) 09/14/2009  . BPH (benign prostatic hyperplasia) 07/27/2009  . CARDIOVASCULAR STUDIES, ABNORMAL 07/27/2009  . CHEST PAIN 06/08/2009  . OSTEOARTHRITIS 05/30/2009  . HEEL PAIN, LEFT 05/24/2009  . Dyspnea on exertion 05/24/2009  . ADJUSTMENT DISORDER WITH MIXED FEATURES 08/25/2008  . Diabetes type 2, uncontrolled (HCC) 02/24/2008  . ERECTILE DYSFUNCTION 02/10/2007  . Hyperlipidemia 10/09/2006  . OBESITY 10/09/2006  . Essential hypertension 10/09/2006  . Coronary atherosclerosis 10/09/2006  . GERD 10/09/2006  . DM (diabetes mellitus) type II uncontrolled with retinopathy 06/10/2006    Past Surgical History:  Procedure Laterality Date  . CARDIOVERSION N/A 11/10/2015   Procedure: CARDIOVERSION;  Surgeon: Laurey Morale, MD;  Location: [facility] ENDOSCOPY;  Service: Cardiovascular;  Laterality: N/A;  . CARDIOVERSION N/A 02/21/2016   Procedure: CARDIOVERSION;  Surgeon: Lewayne Bunting, MD;  Location: [facility] ENDOSCOPY;  Service: Cardiovascular;  Laterality: N/A;  . CORONARY ARTERY BYPASS GRAFT  1996   eith a LIMA to the LAD, saphenous vein graft to the acute marginal, saphenous vein graft to the PDA and saphenous vein graft to the circumflex.  [name] KNEE ARTHROSCOPY  05/2004   right knee  . REPLACEMENT TOTAL KNEE BILATERAL    . SHOULDER SURGERY         Home Medications    Prior to Admission medications   Medication Sig Start Date End Date Taking? Authorizing Provider  acetaminophen (TYLENOL) 500 MG tablet  Take 2 tablets (1,000 mg total) by mouth every 6 (six) hours as needed. 05/24/16   Arby Barrette, MD  apixaban (ELIQUIS) 5 MG TABS tablet Take 1 tablet (5 mg total) by mouth 2 (two) times daily. 11/17/15   Newman Nip, NP  aspirin EC 81 MG tablet Take 1 tablet (81 mg total) by mouth daily. 10/14/15   Brittainy M Simmons, PA-C  atorvastatin (LIPITOR) 80 MG tablet TAKE 1 TABLET (80 MG TOTAL) BY MOUTH DAILY. 04/11/16   Shirline Frees, NP    buPROPion (WELLBUTRIN SR) 150 MG 12 hr tablet Take 1 tablet (150 mg total) by mouth 2 (two) times daily. 03/29/16   [name] Nafziger, NP  BYSTOLIC 20 MG TABS TAKE 1 TABLET (20 MG TOTAL) BY MOUTH DAILY. 05/16/16   Shirline Frees, NP  citalopram (CELEXA) 20 MG tablet Take 1 tablet (20 mg total) by mouth daily. 06/01/16   Shirline Frees, NP  glipiZIDE (GLUCOTROL) 10 MG tablet Take 1 tablet (10 mg total) by mouth 2 (two) times daily before a meal. 03/29/16   Shirline Frees, NP  glucose blood (ACCU-CHEK ACTIVE STRIPS) test strip Use as instructed 03/29/16   Shirline Frees, NP  insulin aspart (NOVOLOG FLEXPEN) 100 UNIT/ML FlexPen USE 15 TO 30 UNITS 15 MINUTES BEFORE MEALS 3 TIMES DAILY 03/29/16   Shirline Frees, NP  Insulin Glargine (BASAGLAR KWIKPEN) 100 UNIT/ML SOPN Inject 0.2 mLs (20 Units total) into the skin at bedtime. 03/29/16   Shirline Frees, NP  Insulin Pen Needle (PEN NEEDLES 31GX5/16") 31G X 8 MM MISC Use to inject insulin 5 times a day. 03/29/16   Shirline Frees, NP  isosorbide mononitrate (IMDUR) 30 MG 24 hr tablet Take 1 tablet (30 mg total) by mouth daily. 10/31/15   Rollene Rotunda, MD  Lancets (ACCU-CHEK MULTICLIX) lancets Use up to 5 times a day to check blood sugar. 03/29/16   Shirline Frees, NP  metFORMIN (GLUCOPHAGE) 1000 MG tablet Take 1 tablet (1,000 mg total) by mouth 2 (two) times daily with a meal. 03/29/16   Shirline Frees, NP  Nebivolol HCl 20 MG TABS Take 1 tablet (20 mg total) by mouth daily. 03/29/16   Shirline Frees, NP  omeprazole (PRILOSEC) 20 MG capsule TAKE ONE CAPSULE TWICE A DAY BEFORE A MEAL 04/02/16   Shirline Frees, NP  ondansetron (ZOFRAN) 4 MG tablet Take 1 tablet (4 mg total) by mouth every 6 (six) hours as needed for nausea or vomiting. 06/27/16   Lavera Guise, MD  valsartan-hydrochlorothiazide (DIOVAN-HCT) 80-12.5 MG tablet Take 1 tablet by mouth daily. 03/29/16   Shirline Frees, NP    Family History Family History  Problem Relation Age of Onset  . Coronary artery disease Mother      deceased at 33  . Coronary artery disease Father     died age 1  . Stroke      half brother  . Stroke Brother     Social History Social History  Substance Use Topics  . Smoking status: Never Smoker  . Smokeless tobacco: Never Used  . Alcohol use 1.2 oz/week    2 Standard drinks or equivalent per week     Comment: twice a month.     Allergies   Patient has no known allergies.   Review of Systems Review of Systems  Constitutional: Negative for fever.  HENT: Negative for congestion.   Respiratory: Positive for cough. Negative for shortness of breath.   Cardiovascular: Negative for chest pain.  Gastrointestinal: Positive for diarrhea, nausea  and vomiting. Negative for abdominal pain.  Genitourinary: Negative for difficulty urinating.  Musculoskeletal: Negative for back pain and neck pain.  Skin: Negative for wound.  Allergic/Immunologic: Negative for immunocompromised state.  Neurological: Negative for headaches.  Hematological: Bruises/bleeds easily.  Psychiatric/Behavioral: Negative for confusion.  All other systems reviewed and are negative.    Physical Exam Updated Vital Signs BP (!) 132/92   Pulse 92   Temp 97.7 F (36.5 C) (Oral)   Resp 20   Ht  (1.88 m)   Wt 220 lb (99.8 kg)   SpO2 97%   BMI 28.25 kg/m   Physical Exam Physical Exam  Nursing note and vitals reviewed. Constitutional: Well developed, well nourished, non-toxic, and in no acute distress Head: Normocephalic and atraumatic.  Mouth/Throat: Oropharynx is clear and moist.  Neck: Normal range of motion. Neck supple. no cervical spine tenderness Cardiovascular: Normal rate and regular rhythm.   Pulmonary/Chest: Effort normal and breath sounds normal.  Abdominal: Soft. There is no tenderness. There is no rebound and no guarding.  Musculoskeletal: Normal range of motion. no deformities. No tls spine tenderness Neurological: Alert, PERRL, no pronator drift, no facial droop, fluent speech,  moves all extremities against gravity symmetrically to command, sensation to light touch in tact throughout Skin: Skin is warm and dry.  Psychiatric: Cooperative   ED Treatments / Results  Labs (all labs ordered are listed, but only abnormal results are displayed) Labs Reviewed  CBC WITH DIFFERENTIAL/PLATELET - Abnormal; Notable for the following:       Result Value   RDW 15.7 (*)    Platelets 136 (*)    Neutro Abs 8.4 (*)    All other components within normal limits  COMPREHENSIVE METABOLIC PANEL - Abnormal; Notable for the following:    Glucose, Bld 177 (*)    BUN 30 (*)    Creatinine, Ser 1.77 (*)    Calcium 8.7 (*)    Total Protein 6.4 (*)    GFR calc non Af Amer 37 (*)    GFR calc Af Amer 42 (*)    All other components within normal limits  CBG MONITORING, ED - Abnormal; Notable for the following:    Glucose-Capillary 163 (*)    All other components within normal limits    EKG  EKG Interpretation  Date/Time:  Wednesday June 27 2016 16:01:31 EDT Ventricular Rate:  78 PR Interval:    QRS Duration: 94 QT Interval:  366 QTC Calculation: 417 R Axis:   1 Text Interpretation:  Atrial fibrillation Moderate voltage criteria for LVH, may be normal variant Inferior infarct , age undetermined ST & T wave abnormality, consider anterolateral ischemia Abnormal ECG No significant change since last tracing Confirmed by Kandis Mannan ([id number]) on 06/27/2016 4:10:45 PM       Radiology Dg Chest 2 View  Result Date: 06/27/2016 CLINICAL DATA:  Left-sided chest pain, cough, and chest congestion. Coronary artery disease. EXAM: CHEST  2 VIEW COMPARISON:  02/15/2016 FINDINGS: The heart size and mediastinal contours are within normal limits. Prior CABG. Both lungs are clear. The visualized skeletal structures are unremarkable. IMPRESSION: Stable exam.  No active cardiopulmonary disease. Electronically Signed   By: Myles Rosenthal M.D.   On: 06/27/2016 18:41   Ct Head Wo Contrast  Result  Date: 06/27/2016 CLINICAL DATA:  Tripped over a step, hit top of his head, patient takes blood thinner EXAM: CT HEAD WITHOUT CONTRAST TECHNIQUE: Contiguous axial images were obtained from the base of the skull through the  vertex without intravenous contrast. COMPARISON:  05/24/2016 FINDINGS: Brain: No intracranial hemorrhage, mass effect or midline shift. No acute cortical infarction. Stable atrophy in chronic small vessel ischemic changes bilateral periventricular and patchy subcortical white matter. No mass lesion is noted on this unenhanced scan. Ventricular size is stable from prior exam. No intraventricular hemorrhage. Vascular: Atherosclerotic calcifications of carotid siphon again noted. Atherosclerotic calcifications bilateral vertebral arteries. Skull: There is no skull fracture. Sinuses/Orbits: Probable mucous retention cyst partially visualized left maxillary sinus measures 1.4 cm. Other: None IMPRESSION: No acute intracranial abnormality. Stable atrophy and chronic white matter small vessel ischemic changes. No definite acute cortical infarction. Electronically Signed   By: Natasha Mead M.D.   On: 06/27/2016 17:24   Ct Cervical Spine Wo Contrast  Result Date: 06/27/2016 CLINICAL DATA:  71 y/o M; status post fall with head injury. No neck complaints at this time. EXAM: CT CERVICAL SPINE WITHOUT CONTRAST TECHNIQUE: Multidetector CT imaging of the cervical spine was performed without intravenous contrast. Multiplanar CT image reconstructions were also generated. COMPARISON:  None. FINDINGS: Alignment: Straightening of cervical lordosis with mild reversal at the C5-6 level. No listhesis. Slight clockwise rotation of C1 on C2 is likely positional. Skull base and vertebrae: No acute fracture. No primary bone lesion or focal pathologic process. Soft tissues and spinal canal: No prevertebral fluid or swelling. No visible canal hematoma. Disc levels: Moderate cervical spondylosis with with predominantly  discogenic degenerative changes at the C6 through T1 levels and left-greater-than-right facet degenerative changes. There are multiple levels of canal stenosis greatest at C6-7 where it is at least moderate. No high-grade bony foraminal narrowing. Extensive productive changes of the anterior C1-2 articulation. Upper chest: Negative. Other: Moderate calcific atherosclerosis of the carotid bifurcations bilaterally. Otherwise negative. IMPRESSION: 1. No acute fracture or dislocation is identified. 2. Moderate cervical spondylosis greatest at C6-7 where there is at least moderate canal stenosis. Electronically Signed   By: Mitzi Hansen M.D.   On: 06/27/2016 18:41    Procedures Procedures (including critical care time)  Medications Ordered in ED Medications  sodium chloride 0.9 % bolus 500 mL (0 mLs Intravenous Stopped 06/27/16 1940)  ondansetron (ZOFRAN-ODT) disintegrating tablet 4 mg (4 mg Oral Given 06/27/16 1853)     Initial Impression / Assessment and Plan / ED Course  I have reviewed the triage vital signs and the nursing notes.  Pertinent labs & imaging results that were available during my care of the patient were reviewed by me and considered in my medical decision making (see chart for details).     71 year old male with history of atrial fibrillation on Eliquis who presents after mechanical fall with potential head injury. He is well-appearing in no acute distress. No evidence of severe head trauma, and neurologically intact. CT head and cervical spine visualized does not show acute intracranial processes or traumatic injuries of the cervical spine. Remainder of exam does not suggest any other traumatic injuries.  In setting of N/V/D, he is well hydrated with benign abdomen. Tolerating PO in ED. With AKI, creatine of 1.7 from baseline of 1.1. Given IVF. Likely from dehydration. No concerning electrolyte or metabolic derangements. Discussed follow-up with PCP for recheck on his  kidney function in 1 week.   The patient appears reasonably screened and/or stabilized for discharge and I doubt any other medical condition or other [facility] requiring further screening, evaluation, or treatment in the ED at this time prior to discharge.  Strict return and follow-up instructions reviewed. She/He expressed understanding of  all discharge instructions and felt comfortable with the plan of care.    Final Clinical Impressions(s) / ED Diagnoses   Final diagnoses:  Fall, initial encounter  Acute kidney injury (HCC)  Nausea vomiting and diarrhea    New Prescriptions New Prescriptions   ONDANSETRON (ZOFRAN) 4 MG TABLET    Take 1 tablet (4 mg total) by mouth every 6 (six) hours as needed for nausea or vomiting.     Lavera Guise, MD 06/27/16 [id number]

## 2016-06-27 NOTE — Progress Notes (Signed)
Pre visit review using our clinic review tool, if applicable. No additional management support is needed unless otherwise documented below in the visit note. 

## 2016-06-27 NOTE — ED Triage Notes (Signed)
Pt presents to the ed with complaints of falling today at his doctors office and hitting his head, he tripped over the curb.  He is on eliquis for atrial fib and aspirin.  The patient is alert and oriented, denies any other symptoms.  Complains of headache and back ache.

## 2016-06-27 NOTE — ED Notes (Signed)
Pt at nurse first after previous discharge and had a vomiting episode. nurse first obtained vitals and felt pt needed to check back in. Pt states he feels weak & has been nauseas. Pt brought back to room.

## 2016-06-27 NOTE — ED Notes (Signed)
Patient transported to CT 

## 2016-06-27 NOTE — Progress Notes (Signed)
Subjective:    Patient ID: Doretha Imus, male    DOB: Dec 28, 1945, 71 y.o.   MRN: [id number]  HPI  71 year old male who  has a past medical history of Adjustment disorder with mixed emotional features; Atrial flutter (HCC); CAD (coronary artery disease); Depression; Diabetes mellitus type II, uncontrolled (HCC); Diabetic peripheral neuropathy (HCC); Erectile dysfunction; GERD (gastroesophageal reflux disease); Herpes zoster; Hyperlipidemia; Hypertension; Obesity; Osteoarthritis; Postherpetic neuralgia; and Sleep apnea.   He originally came into the office today for one month follow up due to starting celexa. His wife reports that she has noticed some changes for the better since Ryley started this medication. She feels as though he is less anxious and depressed but continues not to sleep   He also reports that for the last 3-4 days he has had nausea, vomiting, and diarrhea. His wife reports that he was up all night with diarrhea and vomiting. His wife had these symptoms 3-4 days prior to him starting with these symptoms but she has since resolved.   While coming into the office he fell in the parking lot, when he miss stepped going up onto the sidewalk. He fell backwards, hitting his head on the pavement, scraping his right arm and back. He is on Eliquis.  During the exam he was having a hard time sitting in the wheelchair due to pain in his lower back/buttocks s/p fall. He was placed on the exam table and he started having SOB and chest pain. He was laid down on the exam table and an EKG was obtained. He reported feeling as though his SOB and CP resolved while laying down. Once he sat back up the CP and SOB returned.    Review of Systems See HPI   Past Medical History:  Diagnosis Date  . Adjustment disorder with mixed emotional features   . Atrial flutter (HCC)   . CAD (coronary artery disease)   . Depression   . Diabetes mellitus type II, uncontrolled (HCC)   . Diabetic peripheral  neuropathy (HCC)   . Erectile dysfunction   . GERD (gastroesophageal reflux disease)   . Herpes zoster   . Hyperlipidemia   . Hypertension   . Obesity   . Osteoarthritis   . Postherpetic neuralgia   . Sleep apnea     Social History   Social History  . Marital status: Married    Spouse name: [name]  . Number of children: 1  . Years of education: 48   Occupational History  . Retired Naval architect Retired   Social History Main Topics  . Smoking status: Never Smoker  . Smokeless tobacco: Never Used  . Alcohol use 1.2 oz/week    2 Standard drinks or equivalent per week     Comment: twice a month.  . Drug use: No  . Sexual activity: Yes    Partners: Female   Other Topics Concern  . Not on file   Social History Narrative   Married [name])   Regular exercise: none   Caffeine use: cup of coffee daily    Retired Naval architect   Caffeine- twice daily             Past Surgical History:  Procedure Laterality Date  . CARDIOVERSION N/A 11/10/2015   Procedure: CARDIOVERSION;  Surgeon: Laurey Morale, MD;  Location: [name] ENDOSCOPY;  Service: Cardiovascular;  Laterality: N/A;  . CARDIOVERSION N/A 02/21/2016   Procedure: CARDIOVERSION;  Surgeon: Lewayne Bunting, MD;  Location: [facility] ENDOSCOPY;  Service: Cardiovascular;  Laterality: N/A;  . CORONARY ARTERY BYPASS GRAFT  1996   eith a LIMA to the LAD, saphenous vein graft to the acute marginal, saphenous vein graft to the PDA and saphenous vein graft to the circumflex.  [name] KNEE ARTHROSCOPY  05/2004   right knee  . REPLACEMENT TOTAL KNEE BILATERAL    . SHOULDER SURGERY      Family History  Problem Relation Age of Onset  . Coronary artery disease Mother     deceased at 17  . Coronary artery disease Father     died age 73  . Stroke      half brother  . Stroke Brother     No Known Allergies  Current Outpatient Prescriptions on File Prior to Visit  Medication Sig Dispense Refill  . acetaminophen (TYLENOL) 500 MG tablet Take  2 tablets (1,000 mg total) by mouth every 6 (six) hours as needed. 30 tablet 0  . apixaban (ELIQUIS) 5 MG TABS tablet Take 1 tablet (5 mg total) by mouth 2 (two) times daily. 60 tablet 11  . aspirin EC 81 MG tablet Take 1 tablet (81 mg total) by mouth daily.    [name] atorvastatin (LIPITOR) 80 MG tablet TAKE 1 TABLET (80 MG TOTAL) BY MOUTH DAILY. 90 tablet 3  . buPROPion (WELLBUTRIN SR) 150 MG 12 hr tablet Take 1 tablet (150 mg total) by mouth 2 (two) times daily. 180 tablet 1  . BYSTOLIC 20 MG TABS TAKE 1 TABLET (20 MG TOTAL) BY MOUTH DAILY. 90 tablet 3  . citalopram (CELEXA) 20 MG tablet Take 1 tablet (20 mg total) by mouth daily. 90 tablet 1  . glipiZIDE (GLUCOTROL) 10 MG tablet Take 1 tablet (10 mg total) by mouth 2 (two) times daily before a meal. 180 tablet 3  . glucose blood (ACCU-CHEK ACTIVE STRIPS) test strip Use as instructed 100 each 12  . insulin aspart (NOVOLOG FLEXPEN) 100 UNIT/ML FlexPen USE 15 TO 30 UNITS 15 MINUTES BEFORE MEALS 3 TIMES DAILY 15 pen 6  . Insulin Glargine (BASAGLAR KWIKPEN) 100 UNIT/ML SOPN Inject 0.2 mLs (20 Units total) into the skin at bedtime. 3 pen 11  . Insulin Pen Needle (PEN NEEDLES 31GX5/16") 31G X 8 MM MISC Use to inject insulin 5 times a day. 100 each 11  . isosorbide mononitrate (IMDUR) 30 MG 24 hr tablet Take 1 tablet (30 mg total) by mouth daily. 30 tablet 12  . Lancets (ACCU-CHEK MULTICLIX) lancets Use up to 5 times a day to check blood sugar. 100 each 5  . metFORMIN (GLUCOPHAGE) 1000 MG tablet Take 1 tablet (1,000 mg total) by mouth 2 (two) times daily with a meal. 180 tablet 3  . Nebivolol HCl 20 MG TABS Take 1 tablet (20 mg total) by mouth daily. 90 tablet 3  . omeprazole (PRILOSEC) 20 MG capsule TAKE ONE CAPSULE TWICE A DAY BEFORE A MEAL 180 capsule 0  . valsartan-hydrochlorothiazide (DIOVAN-HCT) 80-12.5 MG tablet Take 1 tablet by mouth daily. 90 tablet 3   Current Facility-Administered Medications on File Prior to Visit  Medication Dose Route  Frequency Provider Last Rate Last Dose  . pneumococcal 13-valent conjugate vaccine (PREVNAR 13) injection 0.5 mL  0.5 mL Intramuscular Tomorrow-1000 Shirline Frees, NP        There were no vitals taken for this visit.      Objective:   Physical Exam  Constitutional: He is oriented to person, place, and time. He appears well-developed and well-nourished. No distress.  Eyes:  Conjunctivae and EOM are normal. Pupils are equal, round, and reactive to light. Right eye exhibits no discharge. Left eye exhibits no discharge. No scleral icterus.  Cardiovascular: Normal rate, normal heart sounds and intact distal pulses.  An irregularly irregular rhythm present. Exam reveals no gallop and no friction rub.   No murmur heard. Pulmonary/Chest: Effort normal and breath sounds normal. No respiratory distress. He has no wheezes. He has no rales. He exhibits no tenderness.  Neurological: He is alert and oriented to person, place, and time.  Skin: Skin is warm and dry. No rash noted. He is not diaphoretic. No erythema. No pallor.  Psychiatric: He has a normal mood and affect. His behavior is normal. Judgment and thought content normal.  Nursing note and vitals reviewed.     Assessment & Plan:  EKG showed A fib with Non-specific ST depression + T wave abnormality. Rate of 83. Reviewing prior EKGs it appears as though he may have a degree of increased ST depression? . Due to acute CP and SOB as well as hitting his head while on Eliquis in the parking lot, I recommended that he go to the ER for further evaluation. He and his wife were ok with this plan. I advised EMS but they both refused. He will be transported via private vehicle.   He will follow up regarding depression/anxiety at a later date. I am going to recommend that possibly he come off Celexa and go on Remeron for depression and sleep issues.   Shirline Frees, NP

## 2016-06-27 NOTE — ED Provider Notes (Signed)
[facility]-EMERGENCY DEPT Provider Note   CSN: [id number] Arrival date & time: 06/27/16  2124     History   Chief Complaint Chief Complaint  Patient presents with  . Emesis    HPI Luis Yu is a 71 y.o. male.  HPI  71 year old male who presents with vomiting. History of atrial fibrillation on Eliquis. Just discharged from ED after mechanical fall with negative CT head and cervical spine. He also has had 1 day of n/v/d, but tolerating PO before he left. While waiting for his ride in the ED, he had 2 episodes of emesis and brought back for re-evaluation. Denies headache, vision or speech changes, focal numbness/weakness. Denies abdominal pain.  Past Medical History:  Diagnosis Date  . Adjustment disorder with mixed emotional features   . Atrial flutter (HCC)   . CAD (coronary artery disease)   . Depression   . Diabetes mellitus type II, uncontrolled (HCC)   . Diabetic peripheral neuropathy (HCC)   . Erectile dysfunction   . GERD (gastroesophageal reflux disease)   . Herpes zoster   . Hyperlipidemia   . Hypertension   . Obesity   . Osteoarthritis   . Postherpetic neuralgia   . Sleep apnea     Patient Active Problem List   Diagnosis Date Noted  . Insomnia 02/02/2016  . Atrial fibrillation (HCC) 10/27/2015  . Uncoordinated movements 02/24/2014  . Unspecified hereditary and idiopathic peripheral neuropathy 02/24/2013  . Low back pain 02/24/2013  . Abnormality of gait 02/09/2013  . Memory loss, short term 12/05/2011  . LUMBAR STRAIN, ACUTE 05/05/2010  . OSA (obstructive sleep apnea) 09/14/2009  . BPH (benign prostatic hyperplasia) 07/27/2009  . CARDIOVASCULAR STUDIES, ABNORMAL 07/27/2009  . CHEST PAIN 06/08/2009  . OSTEOARTHRITIS 05/30/2009  . HEEL PAIN, LEFT 05/24/2009  . Dyspnea on exertion 05/24/2009  . ADJUSTMENT DISORDER WITH MIXED FEATURES 08/25/2008  . Diabetes type 2, uncontrolled (HCC) 02/24/2008  . ERECTILE DYSFUNCTION 02/10/2007  . Hyperlipidemia  10/09/2006  . OBESITY 10/09/2006  . Essential hypertension 10/09/2006  . Coronary atherosclerosis 10/09/2006  . GERD 10/09/2006  . DM (diabetes mellitus) type II uncontrolled with retinopathy 06/10/2006    Past Surgical History:  Procedure Laterality Date  . CARDIOVERSION N/A 11/10/2015   Procedure: CARDIOVERSION;  Surgeon: Laurey Morale, MD;  Location: [facility] ENDOSCOPY;  Service: Cardiovascular;  Laterality: N/A;  . CARDIOVERSION N/A 02/21/2016   Procedure: CARDIOVERSION;  Surgeon: Lewayne Bunting, MD;  Location: [facility] ENDOSCOPY;  Service: Cardiovascular;  Laterality: N/A;  . CORONARY ARTERY BYPASS GRAFT  1996   eith a LIMA to the LAD, saphenous vein graft to the acute marginal, saphenous vein graft to the PDA and saphenous vein graft to the circumflex.  [name] KNEE ARTHROSCOPY  05/2004   right knee  . REPLACEMENT TOTAL KNEE BILATERAL    . SHOULDER SURGERY         Home Medications    Prior to Admission medications   Medication Sig Start Date End Date Taking? Authorizing Provider  acetaminophen (TYLENOL) 500 MG tablet Take 2 tablets (1,000 mg total) by mouth every 6 (six) hours as needed. Patient taking differently: Take 1,000 mg by mouth every 6 (six) hours as needed for mild pain.  05/24/16  Yes Arby Barrette, MD  apixaban (ELIQUIS) 5 MG TABS tablet Take 1 tablet (5 mg total) by mouth 2 (two) times daily. 11/17/15  Yes Newman Nip, NP  aspirin EC 81 MG tablet Take 1 tablet (81 mg total) by mouth daily.  10/14/15  Yes Brittainy Sherlynn Carbon, PA-C  atorvastatin (LIPITOR) 80 MG tablet TAKE 1 TABLET (80 MG TOTAL) BY MOUTH DAILY. 04/11/16  Yes Shirline Frees, NP  buPROPion (WELLBUTRIN SR) 150 MG 12 hr tablet Take 1 tablet (150 mg total) by mouth 2 (two) times daily. 03/29/16  Yes [name] Nafziger, NP  BYSTOLIC 20 MG TABS TAKE 1 TABLET (20 MG TOTAL) BY MOUTH DAILY. 05/16/16  Yes Shirline Frees, NP  citalopram (CELEXA) 20 MG tablet Take 1 tablet (20 mg total) by mouth daily. 06/01/16  Yes Shirline Frees, NP    glipiZIDE (GLUCOTROL) 10 MG tablet Take 1 tablet (10 mg total) by mouth 2 (two) times daily before a meal. Patient taking differently: Take 10 mg by mouth daily before breakfast.  03/29/16  Yes Shirline Frees, NP  glucose blood (ACCU-CHEK ACTIVE STRIPS) test strip Use as instructed 03/29/16  Yes Shirline Frees, NP  insulin aspart (NOVOLOG FLEXPEN) 100 UNIT/ML FlexPen USE 15 TO 30 UNITS 15 MINUTES BEFORE MEALS 3 TIMES DAILY 03/29/16  Yes Shirline Frees, NP  Insulin Glargine (BASAGLAR KWIKPEN) 100 UNIT/ML SOPN Inject 0.2 mLs (20 Units total) into the skin at bedtime. 03/29/16  Yes Shirline Frees, NP  Insulin Pen Needle (PEN NEEDLES 31GX5/16") 31G X 8 MM MISC Use to inject insulin 5 times a day. 03/29/16  Yes Shirline Frees, NP  isosorbide mononitrate (IMDUR) 30 MG 24 hr tablet Take 1 tablet (30 mg total) by mouth daily. 10/31/15  Yes Rollene Rotunda, MD  Lancets (ACCU-CHEK MULTICLIX) lancets Use up to 5 times a day to check blood sugar. 03/29/16  Yes Shirline Frees, NP  metFORMIN (GLUCOPHAGE) 1000 MG tablet Take 1 tablet (1,000 mg total) by mouth 2 (two) times daily with a meal. 03/29/16  Yes Shirline Frees, NP  omeprazole (PRILOSEC) 20 MG capsule TAKE ONE CAPSULE TWICE A DAY BEFORE A MEAL 04/02/16  Yes Shirline Frees, NP  ondansetron (ZOFRAN) 4 MG tablet Take 1 tablet (4 mg total) by mouth every 6 (six) hours as needed for nausea or vomiting. 06/27/16  Yes Lavera Guise, MD  valsartan-hydrochlorothiazide (DIOVAN-HCT) 80-12.5 MG tablet Take 1 tablet by mouth daily. 03/29/16  Yes Shirline Frees, NP    Family History Family History  Problem Relation Age of Onset  . Coronary artery disease Mother     deceased at 26  . Coronary artery disease Father     died age 83  . Stroke      half brother  . Stroke Brother     Social History Social History  Substance Use Topics  . Smoking status: Never Smoker  . Smokeless tobacco: Never Used  . Alcohol use 1.2 oz/week    2 Standard drinks or equivalent per week      Comment: twice a month.     Allergies   Patient has no known allergies.   Review of Systems Review of Systems  Constitutional: Negative for fever.  Respiratory: Negative for shortness of breath.   Cardiovascular: Negative for chest pain.  Gastrointestinal: Negative for abdominal pain.  Genitourinary: Negative for difficulty urinating.  Allergic/Immunologic: Negative for immunocompromised state.  Neurological: Negative for weakness, numbness and headaches.  Hematological: Bruises/bleeds easily.  All other systems reviewed and are negative.    Physical Exam Updated Vital Signs BP 108/74   Pulse 91   Temp 97.9 F (36.6 C) (Oral)   Resp (!) 27   Ht  (1.88 m)   Wt 220 lb (99.8 kg)   SpO2 97%  BMI 28.25 kg/m   Physical Exam Physical Exam  Nursing note and vitals reviewed. Constitutional: Well developed, well nourished, non-toxic, and in no acute distress Head: Normocephalic and atraumatic.  Mouth/Throat: Oropharynx is clear and moist.  Neck: Normal range of motion. Neck supple.  Cardiovascular: Normal rate and regular rhythm.   Pulmonary/Chest: Effort normal and breath sounds normal.  Abdominal: Soft. There is no tenderness. There is no rebound and no guarding.  Musculoskeletal: Normal range of motion.  Neurological: Alert, no facial droop, fluent speech, PERRL, gait instability, full strength bilateral hand grip and bilateral ankle dorsi/plantarflexion, full strength in hold all 4 extremities against gravity, sensation to light touch in tac tthroughout over face and bilateral upper and lower extremities. Skin: Skin is warm and dry.  Psychiatric: Cooperative   ED Treatments / Results  Labs (all labs ordered are listed, but only abnormal results are displayed) Labs Reviewed  URINALYSIS, ROUTINE W REFLEX MICROSCOPIC - Abnormal; Notable for the following:       Result Value   Color, Urine AMBER (*)    APPearance HAZY (*)    Glucose, UA 50 (*)    Ketones, ur 5  (*)    Protein, ur 30 (*)    Bacteria, UA RARE (*)    Squamous Epithelial / LPF 0-5 (*)    All other components within normal limits  CBC WITH DIFFERENTIAL/PLATELET  COMPREHENSIVE METABOLIC PANEL    EKG  EKG Interpretation  Date/Time:  Wednesday June 27 2016 21:31:47 EDT Ventricular Rate:  93 PR Interval:    QRS Duration: 165 QT Interval:  407 QTC Calculation: 507 R Axis:   74 Text Interpretation:  Atrial fibrillation IVCD, consider atypical LBBB Confirmed by LIU MD, DANA [phone]) on 06/27/2016 11:54:35 PM       Radiology Dg Chest 2 View  Result Date: 06/27/2016 CLINICAL DATA:  Left-sided chest pain, cough, and chest congestion. Coronary artery disease. EXAM: CHEST  2 VIEW COMPARISON:  02/15/2016 FINDINGS: The heart size and mediastinal contours are within normal limits. Prior CABG. Both lungs are clear. The visualized skeletal structures are unremarkable. IMPRESSION: Stable exam.  No active cardiopulmonary disease. Electronically Signed   By: Myles Rosenthal M.D.   On: 06/27/2016 18:41   Ct Head Wo Contrast  Result Date: 06/27/2016 CLINICAL DATA:  Tripped over a step, hit top of his head, patient takes blood thinner EXAM: CT HEAD WITHOUT CONTRAST TECHNIQUE: Contiguous axial images were obtained from the base of the skull through the vertex without intravenous contrast. COMPARISON:  05/24/2016 FINDINGS: Brain: No intracranial hemorrhage, mass effect or midline shift. No acute cortical infarction. Stable atrophy in chronic small vessel ischemic changes bilateral periventricular and patchy subcortical white matter. No mass lesion is noted on this unenhanced scan. Ventricular size is stable from prior exam. No intraventricular hemorrhage. Vascular: Atherosclerotic calcifications of carotid siphon again noted. Atherosclerotic calcifications bilateral vertebral arteries. Skull: There is no skull fracture. Sinuses/Orbits: Probable mucous retention cyst partially visualized left maxillary sinus  measures 1.4 cm. Other: None IMPRESSION: No acute intracranial abnormality. Stable atrophy and chronic white matter small vessel ischemic changes. No definite acute cortical infarction. Electronically Signed   By: Natasha Mead M.D.   On: 06/27/2016 17:24   Ct Cervical Spine Wo Contrast  Result Date: 06/27/2016 CLINICAL DATA:  71 y/o M; status post fall with head injury. No neck complaints at this time. EXAM: CT CERVICAL SPINE WITHOUT CONTRAST TECHNIQUE: Multidetector CT imaging of the cervical spine was performed without intravenous contrast. Multiplanar  CT image reconstructions were also generated. COMPARISON:  None. FINDINGS: Alignment: Straightening of cervical lordosis with mild reversal at the C5-6 level. No listhesis. Slight clockwise rotation of C1 on C2 is likely positional. Skull base and vertebrae: No acute fracture. No primary bone lesion or focal pathologic process. Soft tissues and spinal canal: No prevertebral fluid or swelling. No visible canal hematoma. Disc levels: Moderate cervical spondylosis with with predominantly discogenic degenerative changes at the C6 through T1 levels and left-greater-than-right facet degenerative changes. There are multiple levels of canal stenosis greatest at C6-7 where it is at least moderate. No high-grade bony foraminal narrowing. Extensive productive changes of the anterior C1-2 articulation. Upper chest: Negative. Other: Moderate calcific atherosclerosis of the carotid bifurcations bilaterally. Otherwise negative. IMPRESSION: 1. No acute fracture or dislocation is identified. 2. Moderate cervical spondylosis greatest at C6-7 where there is at least moderate canal stenosis. Electronically Signed   By: Mitzi Hansen M.D.   On: 06/27/2016 18:41    Procedures Procedures (including critical care time)  Medications Ordered in ED Medications  acetaminophen (TYLENOL) tablet 1,000 mg (1,000 mg Oral Refused 06/28/16 0022)  sodium chloride 0.9 % bolus 1,000  mL (0 mLs Intravenous Stopped 06/27/16 2347)  ondansetron (ZOFRAN) injection 4 mg (4 mg Intravenous Given 06/27/16 2210)     Initial Impression / Assessment and Plan / ED Course  I have reviewed the triage vital signs and the nursing notes.  Pertinent labs & imaging results that were available during my care of the patient were reviewed by me and considered in my medical decision making (see chart for details).     Patient returns to ED today with persistent vomiting. Recent head injury earlier today, but also for past day with nausea, vomiting, diarrhea from benign GI illness. Patient mentating normally and without focal neurological deficits. However, with significant gait instability which is new. Patient was seen ambulating by nurses steadily on initial ED presentation earlier today. Will repeat head CT to rule out delayed intracranial hemorrhage on Eliquis. His blood work earlier with AKI. UA without infection. Given IVF and antiemetics, and feels improved but still difficulty ambulating.  CT head signed out to Dr. Manus Gunning. If persistent gait instability despite normal head CT may be related to dehydration, and will likely require admission.  Final Clinical Impressions(s) / ED Diagnoses   Final diagnoses:  Non-intractable vomiting with nausea, unspecified vomiting type    New Prescriptions New Prescriptions   No medications on file     Lavera Guise, MD 06/28/16 0102

## 2016-06-27 NOTE — ED Notes (Signed)
Pt given sandwich and sprite.  

## 2016-06-27 NOTE — Discharge Instructions (Signed)
You did not sustain any serious injuries from your fall.  Your kidney function is bumped (creatinine 1.7) due to dehydration from your vomiting and diarrhea. Drink plenty of fluids and eat well. Please follow-up with your PCP for kidney function recheck in 5-7 days Return without fail for worsening symptoms, including confusion, intractable vomiting, fever, or any other symptoms concerning to you.

## 2016-06-28 ENCOUNTER — Inpatient Hospital Stay (HOSPITAL_COMMUNITY): Payer: Medicare Other

## 2016-06-28 ENCOUNTER — Encounter (HOSPITAL_COMMUNITY): Payer: Self-pay | Admitting: Emergency Medicine

## 2016-06-28 ENCOUNTER — Other Ambulatory Visit (HOSPITAL_COMMUNITY): Payer: Medicare Other

## 2016-06-28 ENCOUNTER — Emergency Department (HOSPITAL_COMMUNITY): Payer: Medicare Other

## 2016-06-28 ENCOUNTER — Observation Stay (HOSPITAL_COMMUNITY): Payer: Medicare Other

## 2016-06-28 DIAGNOSIS — Z8249 Family history of ischemic heart disease and other diseases of the circulatory system: Secondary | ICD-10-CM | POA: Diagnosis not present

## 2016-06-28 DIAGNOSIS — S0990XA Unspecified injury of head, initial encounter: Secondary | ICD-10-CM | POA: Diagnosis not present

## 2016-06-28 DIAGNOSIS — I951 Orthostatic hypotension: Secondary | ICD-10-CM | POA: Diagnosis present

## 2016-06-28 DIAGNOSIS — Z23 Encounter for immunization: Secondary | ICD-10-CM | POA: Diagnosis not present

## 2016-06-28 DIAGNOSIS — G43A0 Cyclical vomiting, not intractable: Secondary | ICD-10-CM

## 2016-06-28 DIAGNOSIS — I480 Paroxysmal atrial fibrillation: Secondary | ICD-10-CM | POA: Diagnosis present

## 2016-06-28 DIAGNOSIS — E1140 Type 2 diabetes mellitus with diabetic neuropathy, unspecified: Secondary | ICD-10-CM | POA: Diagnosis not present

## 2016-06-28 DIAGNOSIS — E1151 Type 2 diabetes mellitus with diabetic peripheral angiopathy without gangrene: Secondary | ICD-10-CM | POA: Diagnosis present

## 2016-06-28 DIAGNOSIS — R1110 Vomiting, unspecified: Secondary | ICD-10-CM | POA: Diagnosis not present

## 2016-06-28 DIAGNOSIS — Z794 Long term (current) use of insulin: Secondary | ICD-10-CM | POA: Diagnosis not present

## 2016-06-28 DIAGNOSIS — I639 Cerebral infarction, unspecified: Secondary | ICD-10-CM | POA: Diagnosis present

## 2016-06-28 DIAGNOSIS — Z79899 Other long term (current) drug therapy: Secondary | ICD-10-CM | POA: Diagnosis not present

## 2016-06-28 DIAGNOSIS — R112 Nausea with vomiting, unspecified: Secondary | ICD-10-CM | POA: Diagnosis present

## 2016-06-28 DIAGNOSIS — E1165 Type 2 diabetes mellitus with hyperglycemia: Secondary | ICD-10-CM | POA: Diagnosis not present

## 2016-06-28 DIAGNOSIS — G4730 Sleep apnea, unspecified: Secondary | ICD-10-CM | POA: Diagnosis present

## 2016-06-28 DIAGNOSIS — I482 Chronic atrial fibrillation: Secondary | ICD-10-CM | POA: Diagnosis not present

## 2016-06-28 DIAGNOSIS — N179 Acute kidney failure, unspecified: Secondary | ICD-10-CM | POA: Diagnosis present

## 2016-06-28 DIAGNOSIS — D696 Thrombocytopenia, unspecified: Secondary | ICD-10-CM | POA: Diagnosis present

## 2016-06-28 DIAGNOSIS — K219 Gastro-esophageal reflux disease without esophagitis: Secondary | ICD-10-CM | POA: Diagnosis present

## 2016-06-28 DIAGNOSIS — I4891 Unspecified atrial fibrillation: Secondary | ICD-10-CM | POA: Diagnosis not present

## 2016-06-28 DIAGNOSIS — E1122 Type 2 diabetes mellitus with diabetic chronic kidney disease: Secondary | ICD-10-CM | POA: Diagnosis present

## 2016-06-28 DIAGNOSIS — R627 Adult failure to thrive: Secondary | ICD-10-CM | POA: Diagnosis present

## 2016-06-28 DIAGNOSIS — E11319 Type 2 diabetes mellitus with unspecified diabetic retinopathy without macular edema: Secondary | ICD-10-CM | POA: Diagnosis present

## 2016-06-28 DIAGNOSIS — I69393 Ataxia following cerebral infarction: Secondary | ICD-10-CM | POA: Diagnosis not present

## 2016-06-28 DIAGNOSIS — G4733 Obstructive sleep apnea (adult) (pediatric): Secondary | ICD-10-CM | POA: Diagnosis present

## 2016-06-28 DIAGNOSIS — E1142 Type 2 diabetes mellitus with diabetic polyneuropathy: Secondary | ICD-10-CM | POA: Diagnosis present

## 2016-06-28 DIAGNOSIS — Z823 Family history of stroke: Secondary | ICD-10-CM | POA: Diagnosis not present

## 2016-06-28 DIAGNOSIS — R269 Unspecified abnormalities of gait and mobility: Secondary | ICD-10-CM | POA: Diagnosis not present

## 2016-06-28 DIAGNOSIS — Z7901 Long term (current) use of anticoagulants: Secondary | ICD-10-CM | POA: Diagnosis not present

## 2016-06-28 DIAGNOSIS — E669 Obesity, unspecified: Secondary | ICD-10-CM | POA: Diagnosis present

## 2016-06-28 DIAGNOSIS — I69398 Other sequelae of cerebral infarction: Secondary | ICD-10-CM | POA: Diagnosis not present

## 2016-06-28 DIAGNOSIS — I69314 Frontal lobe and executive function deficit following cerebral infarction: Secondary | ICD-10-CM | POA: Diagnosis not present

## 2016-06-28 DIAGNOSIS — I2510 Atherosclerotic heart disease of native coronary artery without angina pectoris: Secondary | ICD-10-CM | POA: Diagnosis present

## 2016-06-28 DIAGNOSIS — B029 Zoster without complications: Secondary | ICD-10-CM | POA: Diagnosis present

## 2016-06-28 DIAGNOSIS — B0229 Other postherpetic nervous system involvement: Secondary | ICD-10-CM | POA: Diagnosis not present

## 2016-06-28 DIAGNOSIS — Z96653 Presence of artificial knee joint, bilateral: Secondary | ICD-10-CM | POA: Diagnosis present

## 2016-06-28 DIAGNOSIS — R001 Bradycardia, unspecified: Secondary | ICD-10-CM | POA: Diagnosis present

## 2016-06-28 DIAGNOSIS — E11311 Type 2 diabetes mellitus with unspecified diabetic retinopathy with macular edema: Secondary | ICD-10-CM | POA: Diagnosis not present

## 2016-06-28 DIAGNOSIS — A084 Viral intestinal infection, unspecified: Secondary | ICD-10-CM | POA: Diagnosis present

## 2016-06-28 DIAGNOSIS — F0390 Unspecified dementia without behavioral disturbance: Secondary | ICD-10-CM | POA: Diagnosis present

## 2016-06-28 DIAGNOSIS — E785 Hyperlipidemia, unspecified: Secondary | ICD-10-CM | POA: Diagnosis not present

## 2016-06-28 DIAGNOSIS — F4323 Adjustment disorder with mixed anxiety and depressed mood: Secondary | ICD-10-CM | POA: Diagnosis not present

## 2016-06-28 DIAGNOSIS — R2689 Other abnormalities of gait and mobility: Secondary | ICD-10-CM | POA: Diagnosis not present

## 2016-06-28 DIAGNOSIS — R110 Nausea: Secondary | ICD-10-CM | POA: Diagnosis not present

## 2016-06-28 DIAGNOSIS — F329 Major depressive disorder, single episode, unspecified: Secondary | ICD-10-CM | POA: Diagnosis present

## 2016-06-28 DIAGNOSIS — Z7982 Long term (current) use of aspirin: Secondary | ICD-10-CM | POA: Diagnosis not present

## 2016-06-28 DIAGNOSIS — I25810 Atherosclerosis of coronary artery bypass graft(s) without angina pectoris: Secondary | ICD-10-CM | POA: Diagnosis not present

## 2016-06-28 DIAGNOSIS — I129 Hypertensive chronic kidney disease with stage 1 through stage 4 chronic kidney disease, or unspecified chronic kidney disease: Secondary | ICD-10-CM | POA: Diagnosis present

## 2016-06-28 DIAGNOSIS — R634 Abnormal weight loss: Secondary | ICD-10-CM | POA: Diagnosis present

## 2016-06-28 DIAGNOSIS — N189 Chronic kidney disease, unspecified: Secondary | ICD-10-CM

## 2016-06-28 DIAGNOSIS — R270 Ataxia, unspecified: Secondary | ICD-10-CM | POA: Diagnosis not present

## 2016-06-28 DIAGNOSIS — I10 Essential (primary) hypertension: Secondary | ICD-10-CM | POA: Diagnosis not present

## 2016-06-28 DIAGNOSIS — Z951 Presence of aortocoronary bypass graft: Secondary | ICD-10-CM | POA: Diagnosis not present

## 2016-06-28 DIAGNOSIS — D631 Anemia in chronic kidney disease: Secondary | ICD-10-CM | POA: Diagnosis present

## 2016-06-28 DIAGNOSIS — E860 Dehydration: Secondary | ICD-10-CM | POA: Diagnosis present

## 2016-06-28 DIAGNOSIS — N183 Chronic kidney disease, stage 3 (moderate): Secondary | ICD-10-CM | POA: Diagnosis present

## 2016-06-28 LAB — CBC WITH DIFFERENTIAL/PLATELET
Basophils Absolute: 0 10*3/uL (ref 0.0–0.1)
Basophils Absolute: 0 10*3/uL (ref 0.0–0.1)
Basophils Relative: 0 %
Basophils Relative: 0 %
Eosinophils Absolute: 0.1 10*3/uL (ref 0.0–0.7)
Eosinophils Absolute: 0.3 10*3/uL (ref 0.0–0.7)
Eosinophils Relative: 1 %
Eosinophils Relative: 5 %
HCT: 37.9 % — ABNORMAL LOW (ref 39.0–52.0)
HCT: 38.1 % — ABNORMAL LOW (ref 39.0–52.0)
Hemoglobin: 12.2 g/dL — ABNORMAL LOW (ref 13.0–17.0)
Hemoglobin: 12.1 g/dL — ABNORMAL LOW (ref 13.0–17.0)
Lymphocytes Relative: 9 %
Lymphocytes Relative: 13 %
Lymphs Abs: 0.7 10*3/uL (ref 0.7–4.0)
Lymphs Abs: 0.8 10*3/uL (ref 0.7–4.0)
MCH: 29.9 pg (ref 26.0–34.0)
MCH: 29.5 pg (ref 26.0–34.0)
MCHC: 32.2 g/dL (ref 30.0–36.0)
MCHC: 31.8 g/dL (ref 30.0–36.0)
MCV: 92.9 fL (ref 78.0–100.0)
MCV: 92.9 fL (ref 78.0–100.0)
Monocytes Absolute: 0.4 10*3/uL (ref 0.1–1.0)
Monocytes Absolute: 0.3 10*3/uL (ref 0.1–1.0)
Monocytes Relative: 6 %
Monocytes Relative: 5 %
Neutro Abs: 6.3 10*3/uL (ref 1.7–7.7)
Neutro Abs: 4.8 10*3/uL (ref 1.7–7.7)
Neutrophils Relative %: 84 %
Neutrophils Relative %: 77 %
Platelets: 124 10*3/uL — ABNORMAL LOW (ref 150–400)
Platelets: 121 10*3/uL — ABNORMAL LOW (ref 150–400)
RBC: 4.08 MIL/uL — ABNORMAL LOW (ref 4.22–5.81)
RBC: 4.1 MIL/uL — ABNORMAL LOW (ref 4.22–5.81)
RDW: 16 % — ABNORMAL HIGH (ref 11.5–15.5)
RDW: 16.1 % — ABNORMAL HIGH (ref 11.5–15.5)
WBC: 7.6 10*3/uL (ref 4.0–10.5)
WBC: 6.3 10*3/uL (ref 4.0–10.5)

## 2016-06-28 LAB — COMPREHENSIVE METABOLIC PANEL
ALT: 19 U/L (ref 17–63)
ALT: 17 U/L (ref 17–63)
AST: 28 U/L (ref 15–41)
AST: 24 U/L (ref 15–41)
Albumin: 3.4 g/dL — ABNORMAL LOW (ref 3.5–5.0)
Albumin: 3.4 g/dL — ABNORMAL LOW (ref 3.5–5.0)
Alkaline Phosphatase: 85 U/L (ref 38–126)
Alkaline Phosphatase: 81 U/L (ref 38–126)
Anion gap: 9 (ref 5–15)
Anion gap: 11 (ref 5–15)
BUN: 34 mg/dL — ABNORMAL HIGH (ref 6–20)
BUN: 33 mg/dL — ABNORMAL HIGH (ref 6–20)
CO2: 24 mmol/L (ref 22–32)
CO2: 22 mmol/L (ref 22–32)
Calcium: 8.1 mg/dL — ABNORMAL LOW (ref 8.9–10.3)
Calcium: 8 mg/dL — ABNORMAL LOW (ref 8.9–10.3)
Chloride: 104 mmol/L (ref 101–111)
Chloride: 104 mmol/L (ref 101–111)
Creatinine, Ser: 1.94 mg/dL — ABNORMAL HIGH (ref 0.61–1.24)
Creatinine, Ser: 1.61 mg/dL — ABNORMAL HIGH (ref 0.61–1.24)
GFR calc Af Amer: 38 mL/min — ABNORMAL LOW (ref >60)
GFR calc Af Amer: 48 mL/min — ABNORMAL LOW (ref >60)
GFR calc non Af Amer: 33 mL/min — ABNORMAL LOW (ref >60)
GFR calc non Af Amer: 41 mL/min — ABNORMAL LOW (ref >60)
Glucose, Bld: 199 mg/dL — ABNORMAL HIGH (ref 65–99)
Glucose, Bld: 165 mg/dL — ABNORMAL HIGH (ref 65–99)
Potassium: 4.5 mmol/L (ref 3.5–5.1)
Potassium: 4 mmol/L (ref 3.5–5.1)
Sodium: 137 mmol/L (ref 135–145)
Sodium: 137 mmol/L (ref 135–145)
Total Bilirubin: 0.8 mg/dL (ref 0.3–1.2)
Total Bilirubin: 1 mg/dL (ref 0.3–1.2)
Total Protein: 5.7 g/dL — ABNORMAL LOW (ref 6.5–8.1)
Total Protein: 5.8 g/dL — ABNORMAL LOW (ref 6.5–8.1)

## 2016-06-28 LAB — VAS US CAROTID
LEFT VERTEBRAL DIAS: -24 cm/s
Left CCA dist dias: -13 cm/s
Left CCA dist sys: -68 cm/s
Left CCA prox dias: 16 cm/s
Left CCA prox sys: 90 cm/s
Left ICA dist dias: -12 cm/s
Left ICA dist sys: -64 cm/s
Left ICA prox dias: -17 cm/s
Left ICA prox sys: -116 cm/s
RIGHT VERTEBRAL DIAS: 14 cm/s
Right CCA prox dias: 11 cm/s
Right CCA prox sys: 98 cm/s
Right cca dist sys: -111 cm/s

## 2016-06-28 LAB — HEMOGLOBIN A1C
Hgb A1c MFr Bld: 7.6 % — ABNORMAL HIGH (ref 4.8–5.6)
Mean Plasma Glucose: 171 mg/dL

## 2016-06-28 LAB — GLUCOSE, CAPILLARY
Glucose-Capillary: 182 mg/dL — ABNORMAL HIGH (ref 65–99)
Glucose-Capillary: 135 mg/dL — ABNORMAL HIGH (ref 65–99)
Glucose-Capillary: 193 mg/dL — ABNORMAL HIGH (ref 65–99)
Glucose-Capillary: 108 mg/dL — ABNORMAL HIGH (ref 65–99)

## 2016-06-28 LAB — URINALYSIS, ROUTINE W REFLEX MICROSCOPIC
Bilirubin Urine: NEGATIVE
Glucose, UA: 50 mg/dL — AB
Hgb urine dipstick: NEGATIVE
Ketones, ur: 5 mg/dL — AB
Leukocytes, UA: NEGATIVE
Nitrite: NEGATIVE
Protein, ur: 30 mg/dL — AB
Specific Gravity, Urine: 1.024 (ref 1.005–1.030)
pH: 5 (ref 5.0–8.0)

## 2016-06-28 LAB — FERRITIN
Ferritin: 39 ng/mL (ref 24–336)
Ferritin: 47 ng/mL (ref 24–336)

## 2016-06-28 LAB — FOLATE
Folate: 8.7 ng/mL (ref >5.9)
Folate: 7.9 ng/mL (ref >5.9)

## 2016-06-28 LAB — TSH
TSH: 1.3 u[IU]/mL (ref 0.350–4.500)
TSH: 0.983 u[IU]/mL (ref 0.350–4.500)

## 2016-06-28 LAB — RETICULOCYTES
RBC.: 4.1 MIL/uL — ABNORMAL LOW (ref 4.22–5.81)
RBC.: 4.08 MIL/uL — ABNORMAL LOW (ref 4.22–5.81)
Retic Count, Absolute: 53.3 10*3/uL (ref 19.0–186.0)
Retic Count, Absolute: 49 10*3/uL (ref 19.0–186.0)
Retic Ct Pct: 1.3 % (ref 0.4–3.1)
Retic Ct Pct: 1.2 % (ref 0.4–3.1)

## 2016-06-28 LAB — LIPID PANEL
Cholesterol: 96 mg/dL (ref 0–200)
HDL: 28 mg/dL — ABNORMAL LOW (ref >40)
LDL Cholesterol: 50 mg/dL (ref 0–99)
Total CHOL/HDL Ratio: 3.4 RATIO
Triglycerides: 88 mg/dL (ref <150)
VLDL: 18 mg/dL (ref 0–40)

## 2016-06-28 LAB — IRON AND TIBC
Iron: 34 ug/dL — ABNORMAL LOW (ref 45–182)
Iron: 27 ug/dL — ABNORMAL LOW (ref 45–182)
Saturation Ratios: 12 % — ABNORMAL LOW (ref 17.9–39.5)
Saturation Ratios: 10 % — ABNORMAL LOW (ref 17.9–39.5)
TIBC: 277 ug/dL (ref 250–450)
TIBC: 281 ug/dL (ref 250–450)
UIBC: 243 ug/dL
UIBC: 254 ug/dL

## 2016-06-28 LAB — VITAMIN B12
Vitamin B-12: 214 pg/mL (ref 180–914)
Vitamin B-12: 230 pg/mL (ref 180–914)

## 2016-06-28 LAB — TROPONIN I
Troponin I: <0.03 ng/mL (ref <0.03)
Troponin I: <0.03 ng/mL (ref <0.03)

## 2016-06-28 LAB — T4, FREE: Free T4: 1.21 ng/dL — ABNORMAL HIGH (ref 0.61–1.12)

## 2016-06-28 LAB — LACTATE DEHYDROGENASE: LDH: 210 U/L — ABNORMAL HIGH (ref 98–192)

## 2016-06-28 LAB — CK: Total CK: 388 U/L (ref 49–397)

## 2016-06-28 MED ORDER — SODIUM CHLORIDE 0.9 % IV SOLN
INTRAVENOUS | Status: DC
  Administered 2016-06-28: 03:00:00 via INTRAVENOUS

## 2016-06-28 MED ORDER — ATORVASTATIN CALCIUM 80 MG PO TABS
80.0000 mg | ORAL_TABLET | Freq: Every day | ORAL | Status: DC
  Administered 2016-06-28 – 2016-07-02 (×5): 80 mg via ORAL
  Filled 2016-06-28 (×5): qty 1

## 2016-06-28 MED ORDER — BUPROPION HCL ER (SR) 150 MG PO TB12
150.0000 mg | ORAL_TABLET | Freq: Two times a day (BID) | ORAL | Status: DC
  Administered 2016-06-28 – 2016-07-02 (×9): 150 mg via ORAL
  Filled 2016-06-28 (×9): qty 1

## 2016-06-28 MED ORDER — NEBIVOLOL HCL 10 MG PO TABS
20.0000 mg | ORAL_TABLET | Freq: Every day | ORAL | Status: DC
  Administered 2016-06-28 – 2016-07-02 (×5): 20 mg via ORAL
  Filled 2016-06-28 (×5): qty 2

## 2016-06-28 MED ORDER — ISOSORBIDE MONONITRATE ER 30 MG PO TB24
30.0000 mg | ORAL_TABLET | Freq: Every day | ORAL | Status: DC
  Administered 2016-06-28 – 2016-07-02 (×5): 30 mg via ORAL
  Filled 2016-06-28 (×5): qty 1

## 2016-06-28 MED ORDER — ACETAMINOPHEN 325 MG PO TABS
650.0000 mg | ORAL_TABLET | ORAL | Status: DC | PRN
  Administered 2016-06-29 – 2016-07-01 (×3): 650 mg via ORAL
  Filled 2016-06-28 (×2): qty 2

## 2016-06-28 MED ORDER — APIXABAN 5 MG PO TABS
5.0000 mg | ORAL_TABLET | Freq: Two times a day (BID) | ORAL | Status: DC
  Administered 2016-06-28 – 2016-07-02 (×9): 5 mg via ORAL
  Filled 2016-06-28 (×9): qty 1

## 2016-06-28 MED ORDER — CITALOPRAM HYDROBROMIDE 20 MG PO TABS
20.0000 mg | ORAL_TABLET | Freq: Every day | ORAL | Status: DC
  Administered 2016-06-28 – 2016-07-02 (×5): 20 mg via ORAL
  Filled 2016-06-28 (×5): qty 1

## 2016-06-28 MED ORDER — STROKE: EARLY STAGES OF RECOVERY BOOK
Freq: Once | Status: DC
  Filled 2016-06-28 (×4): qty 1

## 2016-06-28 MED ORDER — INSULIN ASPART 100 UNIT/ML ~~LOC~~ SOLN
10.0000 [IU] | Freq: Three times a day (TID) | SUBCUTANEOUS | Status: DC
  Administered 2016-06-28 – 2016-06-30 (×4): 10 [IU] via SUBCUTANEOUS

## 2016-06-28 MED ORDER — PANTOPRAZOLE SODIUM 40 MG PO TBEC
40.0000 mg | DELAYED_RELEASE_TABLET | Freq: Every day | ORAL | Status: DC
  Administered 2016-06-28 – 2016-07-02 (×5): 40 mg via ORAL
  Filled 2016-06-28 (×5): qty 1

## 2016-06-28 MED ORDER — INSULIN GLARGINE 100 UNIT/ML ~~LOC~~ SOLN
20.0000 [IU] | Freq: Every day | SUBCUTANEOUS | Status: DC
  Administered 2016-06-28 – 2016-07-01 (×4): 20 [IU] via SUBCUTANEOUS
  Filled 2016-06-28 (×5): qty 0.2

## 2016-06-28 MED ORDER — SODIUM CHLORIDE 0.9 % IV SOLN
INTRAVENOUS | Status: AC
  Administered 2016-06-28: 09:00:00 via INTRAVENOUS

## 2016-06-28 MED ORDER — ASPIRIN EC 81 MG PO TBEC
81.0000 mg | DELAYED_RELEASE_TABLET | Freq: Every day | ORAL | Status: DC
  Administered 2016-06-28 – 2016-07-02 (×5): 81 mg via ORAL
  Filled 2016-06-28 (×5): qty 1

## 2016-06-28 MED ORDER — ACETAMINOPHEN 160 MG/5ML PO SOLN: 650.0000 mg | ORAL | Status: DC | PRN

## 2016-06-28 MED ORDER — ACETAMINOPHEN 650 MG RE SUPP: 650.0000 mg | RECTAL | Status: DC | PRN

## 2016-06-28 MED ORDER — INSULIN ASPART 100 UNIT/ML ~~LOC~~ SOLN
0.0000 [IU] | Freq: Three times a day (TID) | SUBCUTANEOUS | Status: DC
  Administered 2016-06-28 – 2016-06-29 (×3): 2 [IU] via SUBCUTANEOUS
  Administered 2016-06-29: 1 [IU] via SUBCUTANEOUS
  Administered 2016-06-30: 5 [IU] via SUBCUTANEOUS
  Administered 2016-07-01 – 2016-07-02 (×4): 2 [IU] via SUBCUTANEOUS

## 2016-06-28 NOTE — Consult Note (Signed)
Requesting Physician: Dr. Susie Cassette    Chief Complaint:  stroke  History obtained from:  Patient    HPI:                                                                                                                                         Luis Yu is an 71 y.o. male who was referred to the ER by patient's primary care physician yesterday after patient had a fall at the office. Patient states 2 days ago patient started having persistent nausea vomiting which improved by evening 2 days ago. In the ER patient had CT of the head and neck which was unremarkable and was discharged home. While waiting for his wife outside the patient again had started having nausea vomiting. A repeat CT head was done which was negative. While I'm reading the ER patient was found to have ataxia and patient had MRI of the brain which shows acute ischemia involving the left frontal lobe.   Initially when consulting with the patient patient says he feels much better. In talking to the patient is retired and states that he has a very non-active and does not do any exercise at home. Occupational therapy was in the room and thus I tried to get him up to walk him. Upon standing patient became very short of breath and could only stand for approximately 1 minute without feeling as if he was very dizzy and going to faint needing to sit down. Initial blood pressures were taken initially his blood pressure was 130/110 with a pulse of 114, 5 seconds later I took his blood pressure again it was 136/53 with a pulse of 75 at this point he was short of breath and had a sit down. Patient appears extremely deconditioned and when asked if he felt deconditioned he did admit that he is deconditioned.    Date last known well: Date: 06/26/2016 Time last known well: Unable to determine tPA Given: No: out of window   Past Medical History:  Diagnosis Date  . Adjustment disorder with mixed emotional features   . Atrial flutter (HCC)   . CAD  (coronary artery disease)   . Depression   . Diabetes mellitus type II, uncontrolled (HCC)   . Diabetic peripheral neuropathy (HCC)   . Erectile dysfunction   . GERD (gastroesophageal reflux disease)   . Herpes zoster   . Hyperlipidemia   . Hypertension   . Obesity   . Osteoarthritis   . Postherpetic neuralgia   . Sleep apnea     Past Surgical History:  Procedure Laterality Date  . CARDIOVERSION N/A 11/10/2015   Procedure: CARDIOVERSION;  Surgeon: Laurey Morale, MD;  Location: [facility] ENDOSCOPY;  Service: Cardiovascular;  Laterality: N/A;  . CARDIOVERSION N/A 02/21/2016   Procedure: CARDIOVERSION;  Surgeon: Lewayne Bunting, MD;  Location: [facility] ENDOSCOPY;  Service: Cardiovascular;  Laterality: N/A;  . CORONARY  ARTERY BYPASS GRAFT  1996   eith a LIMA to the LAD, saphenous vein graft to the acute marginal, saphenous vein graft to the PDA and saphenous vein graft to the circumflex.  [name] KNEE ARTHROSCOPY  05/2004   right knee  . REPLACEMENT TOTAL KNEE BILATERAL    . SHOULDER SURGERY      Family History  Problem Relation Age of Onset  . Coronary artery disease Mother     deceased at 10  . Coronary artery disease Father     died age 68  . Stroke      half brother  . Stroke Brother    Social History:  reports that he has never smoked. He has never used smokeless tobacco. He reports that he drinks about 1.2 oz of alcohol per week . He reports that he does not use drugs.  Allergies: No Known Allergies  Medications:                                                                                                                           Prior to Admission:  Facility-Administered Medications Prior to Admission  Medication Dose Route Frequency Provider Last Rate Last Dose  . pneumococcal 13-valent conjugate vaccine (PREVNAR 13) injection 0.5 mL  0.5 mL Intramuscular Tomorrow-1000 Shirline Frees, NP       Prescriptions Prior to Admission  Medication Sig Dispense Refill Last Dose  .  acetaminophen (TYLENOL) 500 MG tablet Take 2 tablets (1,000 mg total) by mouth every 6 (six) hours as needed. (Patient taking differently: Take 1,000 mg by mouth every 6 (six) hours as needed for mild pain. ) 30 tablet 0 unk  . apixaban (ELIQUIS) 5 MG TABS tablet Take 1 tablet (5 mg total) by mouth 2 (two) times daily. 60 tablet 11 06/27/2016 at 1000  . aspirin EC 81 MG tablet Take 1 tablet (81 mg total) by mouth daily.   06/27/2016 at Unknown time  . atorvastatin (LIPITOR) 80 MG tablet TAKE 1 TABLET (80 MG TOTAL) BY MOUTH DAILY. 90 tablet 3 06/26/2016 at Unknown time  . buPROPion (WELLBUTRIN SR) 150 MG 12 hr tablet Take 1 tablet (150 mg total) by mouth 2 (two) times daily. 180 tablet 1 06/27/2016 at am  . BYSTOLIC 20 MG TABS TAKE 1 TABLET (20 MG TOTAL) BY MOUTH DAILY. 90 tablet 3 06/26/2016 at 2130  . citalopram (CELEXA) 20 MG tablet Take 1 tablet (20 mg total) by mouth daily. 90 tablet 1 06/26/2016 at Unknown time  . glipiZIDE (GLUCOTROL) 10 MG tablet Take 1 tablet (10 mg total) by mouth 2 (two) times daily before a meal. (Patient taking differently: Take 10 mg by mouth daily before breakfast. ) 180 tablet 3 06/26/2016 at Unknown time  . glucose blood (ACCU-CHEK ACTIVE STRIPS) test strip Use as instructed 100 each 12 unk  . insulin aspart (NOVOLOG FLEXPEN) 100 UNIT/ML FlexPen USE 15 TO 30 UNITS 15 MINUTES BEFORE MEALS 3 TIMES DAILY 15 pen 6 06/26/2016  at Unknown time  . Insulin Glargine (BASAGLAR KWIKPEN) 100 UNIT/ML SOPN Inject 0.2 mLs (20 Units total) into the skin at bedtime. 3 pen 11 06/26/2016 at Unknown time  . Insulin Pen Needle (PEN NEEDLES 31GX5/16") 31G X 8 MM MISC Use to inject insulin 5 times a day. 100 each 11 unk  . isosorbide mononitrate (IMDUR) 30 MG 24 hr tablet Take 1 tablet (30 mg total) by mouth daily. 30 tablet 12 06/27/2016 at Unknown time  . Lancets (ACCU-CHEK MULTICLIX) lancets Use up to 5 times a day to check blood sugar. 100 each 5 unk  . metFORMIN (GLUCOPHAGE) 1000 MG tablet Take 1  tablet (1,000 mg total) by mouth 2 (two) times daily with a meal. 180 tablet 3 06/27/2016 at am  . omeprazole (PRILOSEC) 20 MG capsule TAKE ONE CAPSULE TWICE A DAY BEFORE A MEAL 180 capsule 0 06/27/2016 at am  . ondansetron (ZOFRAN) 4 MG tablet Take 1 tablet (4 mg total) by mouth every 6 (six) hours as needed for nausea or vomiting. 12 tablet 0 unk  . valsartan-hydrochlorothiazide (DIOVAN-HCT) 80-12.5 MG tablet Take 1 tablet by mouth daily. 90 tablet 3 06/27/2016 at Unknown time   Scheduled: .  stroke: mapping our early stages of recovery book   Does not apply Once  . acetaminophen  1,000 mg Oral Once  . apixaban  5 mg Oral BID  . aspirin EC  81 mg Oral Daily  . atorvastatin  80 mg Oral Daily  . buPROPion  150 mg Oral BID  . citalopram  20 mg Oral Daily  . insulin aspart  0-9 Units Subcutaneous TID WC  . insulin aspart  10 Units Subcutaneous TID WC  . insulin glargine  20 Units Subcutaneous QHS  . isosorbide mononitrate  30 mg Oral Daily  . nebivolol  20 mg Oral Daily  . pantoprazole  40 mg Oral Daily    ROS:                                                                                                                                       History obtained from the patient  General ROS: negative for - chills, fatigue, fever, night sweats, weight gain or weight loss Psychological ROS: negative for - behavioral disorder, hallucinations, memory difficulties, mood swings or suicidal ideation Ophthalmic ROS: negative for - blurry vision, double vision, eye pain or loss of vision ENT ROS: negative for - epistaxis, nasal discharge, oral lesions, sore throat, tinnitus or vertigo Allergy and Immunology ROS: negative for - hives or itchy/watery eyes Hematological and Lymphatic ROS: negative for - bleeding problems, bruising or swollen lymph nodes Endocrine ROS: negative for - galactorrhea, hair pattern changes, polydipsia/polyuria or temperature intolerance Respiratory ROS: negative for - cough,  hemoptysis, shortness of breath or wheezing Cardiovascular ROS: negative for - chest pain, dyspnea on exertion, edema or irregular heartbeat Gastrointestinal ROS: positivefor - , nausea/vomiting  Genito-Urinary ROS: negative for - dysuria, hematuria, incontinence or urinary frequency/urgency Musculoskeletal ROS: negative for - joint swelling or muscular weakness Neurological ROS: as noted in HPI Dermatological ROS: negative for rash and skin lesion changes  Neurologic Examination:                                                                                                      Blood pressure (!) 143/68, pulse 79, temperature 98.2 F (36.8 C), temperature source Oral, resp. rate 20, height  (1.88 m), weight 99.8 kg (220 lb), SpO2 92 %.  HEENT-  Normocephalic, no lesions, without obvious abnormality.  Normal external eye and conjunctiva.  Normal TM's bilaterally.  Normal auditory canals and external ears. Normal external nose, mucus membranes and septum.  Normal pharynx. Cardiovascular- irregularly irregular rhythm, pulses palpable throughout   Lungs- chest clear, no wheezing, rales, normal symmetric air entry Abdomen- normal findings: bowel sounds normal Extremities- no edema Lymph-no adenopathy palpable Musculoskeletal-no joint tenderness, deformity or swelling Skin-warm and dry, no hyperpigmentation, vitiligo, or suspicious lesions  Neurological Examination Mental Status: Alert, oriented, thought content appropriate.  Speech fluent without evidence of aphasia.  Able to follow 3 step commands without difficulty. Cranial Nerves: II:  Visual fields grossly normal,  III,IV, VI: ptosis not present, extra-ocular motions intact bilaterally, pupils equal, round, reactive to light and accommodation V,VII: smile symmetric, facial light touch sensation normal bilaterally VIII: hearing normal bilaterally IX,X: uvula rises symmetrically XI: bilateral shoulder shrug XII: midline tongue  extension Motor: Right : Upper extremity   5/5    Left:     Upper extremity   5/5  Lower extremity   5/5     Lower extremity   5/5 Tone and bulk:normal tone throughout; no atrophy noted Sensory: Pinprick and light touch intact throughout, bilaterally Deep Tendon Reflexes: No significant deep tendon reflexes were noted throughout Plantars: Right: downgoing   Left: downgoing Cerebellar: normal finger-to-nose, heel-to-shin test showed significant dysmetria bilaterally Gait: Unable to examine secondary to patient only being able to stand for a few seconds without becoming short of breath and feeling as though he was going to pass out.    Patient continually retropulsed was sitting at the edge of his bed. In order to stand up patient needed to use both hands to push off the bed multiple times and still then had needed assistance. Not get patient to walk secondary to the fact that he became short of breath and had to immediately sit down. No cogwheeling was noted but patient does have a postural tremor.   Lab Results: Basic Metabolic Panel:  Recent Labs Lab 06/27/16 1807 06/28/16 0052  NA 138 137  K 4.5 4.5  CL 102 104  CO2 24 24  GLUCOSE 177* 199*  BUN 30* 34*  CREATININE 1.77* 1.94*  CALCIUM 8.7* 8.1*    Liver Function Tests:  Recent Labs Lab 06/27/16 1807 06/28/16 0052  AST 30 28  ALT 21 19  ALKPHOS 102 85  BILITOT 1.1 0.8  PROT 6.4* 5.7*  ALBUMIN 3.8 3.4*   No results for  input(s): LIPASE, AMYLASE in the last 168 hours. No results for input(s): AMMONIA in the last 168 hours.  CBC:  Recent Labs Lab 06/27/16 1807 06/28/16 0052 06/28/16 0718  WBC 9.8 7.6 6.3  NEUTROABS 8.4* 6.3 4.8  HGB 13.7 12.2* 12.1*  HCT 43.4 37.9* 38.1*  MCV 92.7 92.9 92.9  PLT 136* 124* 121*    Cardiac Enzymes:  Recent Labs Lab 06/28/16 0308  TROPONINI <0.03    Lipid Panel:  Recent Labs Lab 06/28/16 0718  CHOL 96  TRIG 88  HDL 28*  CHOLHDL 3.4  VLDL 18  LDLCALC 50     CBG:  Recent Labs Lab 06/27/16 1735 06/28/16 0817  GLUCAP 163* 182*    Microbiology: Results for orders placed or performed during the hospital encounter of 08/17/09  MRSA PCR Screening     Status: None   Collection Time: 08/17/09  4:55 PM  Result Value Ref Range Status   MRSA by PCR  NEGATIVE Final    NEGATIVE        The GeneXpert MRSA Assay (FDA approved for NASAL specimens only), is one component of a comprehensive MRSA colonization surveillance program. It is not intended to diagnose MRSA infection nor to guide or monitor treatment for MRSA infections.    Coagulation Studies: No results for input(s): LABPROT, INR in the last 72 hours.  Imaging: Dg Chest 2 View  Result Date: 06/27/2016 CLINICAL DATA:  Left-sided chest pain, cough, and chest congestion. Coronary artery disease. EXAM: CHEST  2 VIEW COMPARISON:  02/15/2016 FINDINGS: The heart size and mediastinal contours are within normal limits. Prior CABG. Both lungs are clear. The visualized skeletal structures are unremarkable. IMPRESSION: Stable exam.  No active cardiopulmonary disease. Electronically Signed   By: Myles Rosenthal M.D.   On: 06/27/2016 18:41   Ct Head Wo Contrast  Result Date: 06/28/2016 CLINICAL DATA:  Patient fell on concrete hitting top of head. EXAM: CT HEAD WITHOUT CONTRAST TECHNIQUE: Contiguous axial images were obtained from the base of the skull through the vertex without intravenous contrast. COMPARISON:  None. FINDINGS: BRAIN: There mild is sulcal and ventricular prominence consistent with superficial and central atrophy. No intraparenchymal hemorrhage, mass effect nor midline shift. Periventricular and subcortical white matter hypodensities consistent with chronic small vessel ischemic disease are identified. No acute large vascular territory infarcts. No abnormal extra-axial fluid collections. Basal cisterns are not effaced and midline. VASCULAR: Moderate calcific atherosclerosis of the carotid  siphons and both vertebral arteries. SKULL: No skull fracture. No significant scalp soft tissue swelling. SINUSES/ORBITS: The mastoid air-cells are clear. The included paranasal sinuses are well-aerated within the left maxillary sinus with partially visualized small mucous retention cystThe included ocular globes and orbital contents are non-suspicious. OTHER: None. IMPRESSION: 1. Chronic appearing small vessel ischemic disease of periventricular white matter with cerebral mild atrophy. 2. No acute intracranial abnormality. Electronically Signed   By: Tollie Eth M.D.   On: 06/28/2016 01:36   Ct Head Wo Contrast  Result Date: 06/27/2016 CLINICAL DATA:  Tripped over a step, hit top of his head, patient takes blood thinner EXAM: CT HEAD WITHOUT CONTRAST TECHNIQUE: Contiguous axial images were obtained from the base of the skull through the vertex without intravenous contrast. COMPARISON:  05/24/2016 FINDINGS: Brain: No intracranial hemorrhage, mass effect or midline shift. No acute cortical infarction. Stable atrophy in chronic small vessel ischemic changes bilateral periventricular and patchy subcortical white matter. No mass lesion is noted on this unenhanced scan. Ventricular size is stable from prior exam.  No intraventricular hemorrhage. Vascular: Atherosclerotic calcifications of carotid siphon again noted. Atherosclerotic calcifications bilateral vertebral arteries. Skull: There is no skull fracture. Sinuses/Orbits: Probable mucous retention cyst partially visualized left maxillary sinus measures 1.4 cm. Other: None IMPRESSION: No acute intracranial abnormality. Stable atrophy and chronic white matter small vessel ischemic changes. No definite acute cortical infarction. Electronically Signed   By: Natasha Mead M.D.   On: 06/27/2016 17:24   Ct Cervical Spine Wo Contrast  Result Date: 06/27/2016 CLINICAL DATA:  71 y/o M; status post fall with head injury. No neck complaints at this time. EXAM: CT CERVICAL  SPINE WITHOUT CONTRAST TECHNIQUE: Multidetector CT imaging of the cervical spine was performed without intravenous contrast. Multiplanar CT image reconstructions were also generated. COMPARISON:  None. FINDINGS: Alignment: Straightening of cervical lordosis with mild reversal at the C5-6 level. No listhesis. Slight clockwise rotation of C1 on C2 is likely positional. Skull base and vertebrae: No acute fracture. No primary bone lesion or focal pathologic process. Soft tissues and spinal canal: No prevertebral fluid or swelling. No visible canal hematoma. Disc levels: Moderate cervical spondylosis with with predominantly discogenic degenerative changes at the C6 through T1 levels and left-greater-than-right facet degenerative changes. There are multiple levels of canal stenosis greatest at C6-7 where it is at least moderate. No high-grade bony foraminal narrowing. Extensive productive changes of the anterior C1-2 articulation. Upper chest: Negative. Other: Moderate calcific atherosclerosis of the carotid bifurcations bilaterally. Otherwise negative. IMPRESSION: 1. No acute fracture or dislocation is identified. 2. Moderate cervical spondylosis greatest at C6-7 where there is at least moderate canal stenosis. Electronically Signed   By: Mitzi Hansen M.D.   On: 06/27/2016 18:41   Mr Brain Wo Contrast  Result Date: 06/28/2016 CLINICAL DATA:  Ataxia and vomiting EXAM: MRI HEAD WITHOUT CONTRAST TECHNIQUE: Multiplanar, multiecho pulse sequences of the brain and surrounding structures were obtained without intravenous contrast. COMPARISON:  Head CT 06/28/2016 Brain MRI 02/16/2013 FINDINGS: Brain: There is a punctate focus of diffusion restriction within the posterior left frontal white matter, near the base of the left central sulcus. There is beginning confluent hyperintense T2-weighted signal within the periventricular white matter, most often seen in the setting of chronic microvascular ischemia. Old  lacunar infarct in the superior left frontal white matter is unchanged. No mass lesion or midline shift. No hydrocephalus or extra-axial fluid collection. The midline structures are normal. No age advanced or lobar predominant atrophy. Vascular: Major intracranial arterial and venous sinus flow voids are preserved. No evidence of chronic microhemorrhage or amyloid angiopathy. Skull and upper cervical spine: The visualized skull base, calvarium, upper cervical spine and extracranial soft tissues are normal. Sinuses/Orbits: No fluid levels or advanced mucosal thickening. No mastoid effusion. Normal orbits. IMPRESSION: 1. Punctate focus of acute ischemia within the posterior left frontal white matter without hemorrhage or mass effect. 2. Chronic microvascular ischemia and old lacunar infarcts, unchanged compared to the 02/16/2013. Electronically Signed   By: Deatra Robinson M.D.   On: 06/28/2016 06:02       Assessment and plan discussed with with attending physician and they are in agreement.    Felicie Morn PA-C [facility] [phone]  06/28/2016, 9:13 AM   Assessment: 71 y.o. male presenting to the emergency room secondary to multiple falls, nausea vomiting, dizziness and eventually MRI showing a punctate focus of ischemia in the posterior left frontal white matter. At this time I do not believe that this infarct is the cause of his symptoms. However on examination patient becomes  extremely short of breath on standing, this significantly unstable when standing and continually falls backwards when even sitting. Patient is significantly deconditioned and most likely will need inpatient rehabilitation for reconditioning.  Stroke Risk Factors - atrial fibrillation, diabetes mellitus, hyperlipidemia and hypertension   Recommendations: 1. HgbA1c, fasting lipid panel 2. MRI, MRA  of the brain without contrast 3. PT consult, OT consult, Speech consult--most likely will need inpatient  rehabilitation for conditioning 4. Echocardiogram 5. Carotid dopplers 6. Prophylactic therapy-at this point continue anticoagulation however would seriously consider having cardiology consult as he is a significant fall risk at this point in time. 7. Risk factor modification 8. Telemetry monitoring 9. Frequent neuro checks 10 NPO until passes stroke swallow screen 11 please page stroke NP  Or  PA  Or MD from 8am -4 pm  as this patient from this time will be  followed by the stroke.   You can look them up on [email or web address]  Password [email or web address]

## 2016-06-28 NOTE — ED Notes (Signed)
Pt unable to walk without assistance, had a 2 person assist and pt felt weak, out of breath

## 2016-06-28 NOTE — H&P (Signed)
History and Physical    HENRIQUE PAREKH ZOX:[id number] DOB: July 09, 1945 DOA: 06/27/2016  PCP: Shirline Frees, NP  Patient coming from: Home.  Chief Complaint: Difficulty walking. Nausea vomiting.  HPI: Luis Yu is a 71 y.o. male with history of CAD status post CABG, chronic atrial fibrillation, hypertension, diabetes mellitus and sleep apnea was referred to the ER by patient's primary care physician yesterday after patient had a fall at the office. Patient states 2 days ago patient started having persistent nausea vomiting which improved by evening 2 days ago. History patient had gone to the PCPs office and had loss of balance and fell. Denies hitting his head or losing consciousness. In the ER patient had CT of the head and neck which was unremarkable and was discharged home. While waiting for his wife outside the patient again had started having nausea vomiting. A repeat CT head was done which was negative. While I'm reading the ER patient was found to have ataxia and patient had MRI of the brain which shows acute ischemia involving the left frontal lobe. Patient denies any difficulty swallowing speaking or any weakness of the extremities. Denies any headache or visual symptoms. Denies any abdominal pain. Patient states his wife also has been having nausea vomiting last few days. Denies any diarrhea. Patient is being admitted for stroke and also nausea vomiting. Labs also revealed acute on chronic renal failure.  ED Course: See history of present illness.  Review of Systems: As per HPI, rest all negative.   Past Medical History:  Diagnosis Date  . Adjustment disorder with mixed emotional features   . Atrial flutter (HCC)   . CAD (coronary artery disease)   . Depression   . Diabetes mellitus type II, uncontrolled (HCC)   . Diabetic peripheral neuropathy (HCC)   . Erectile dysfunction   . GERD (gastroesophageal reflux disease)   . Herpes zoster   . Hyperlipidemia   . Hypertension   .  Obesity   . Osteoarthritis   . Postherpetic neuralgia   . Sleep apnea     Past Surgical History:  Procedure Laterality Date  . CARDIOVERSION N/A 11/10/2015   Procedure: CARDIOVERSION;  Surgeon: Laurey Morale, MD;  Location: [facility] ENDOSCOPY;  Service: Cardiovascular;  Laterality: N/A;  . CARDIOVERSION N/A 02/21/2016   Procedure: CARDIOVERSION;  Surgeon: Lewayne Bunting, MD;  Location: [facility] ENDOSCOPY;  Service: Cardiovascular;  Laterality: N/A;  . CORONARY ARTERY BYPASS GRAFT  1996   eith a LIMA to the LAD, saphenous vein graft to the acute marginal, saphenous vein graft to the PDA and saphenous vein graft to the circumflex.  [name] KNEE ARTHROSCOPY  05/2004   right knee  . REPLACEMENT TOTAL KNEE BILATERAL    . SHOULDER SURGERY       reports that he has never smoked. He has never used smokeless tobacco. He reports that he drinks about 1.2 oz of alcohol per week . He reports that he does not use drugs.  No Known Allergies  Family History  Problem Relation Age of Onset  . Coronary artery disease Mother     deceased at 49  . Coronary artery disease Father     died age 78  . Stroke      half brother  . Stroke Brother     Prior to Admission medications   Medication Sig Start Date End Date Taking? Authorizing Provider  acetaminophen (TYLENOL) 500 MG tablet Take 2 tablets (1,000 mg total) by mouth every 6 (six) hours  as needed. Patient taking differently: Take 1,000 mg by mouth every 6 (six) hours as needed for mild pain.  05/24/16  Yes Arby Barrette, MD  apixaban (ELIQUIS) 5 MG TABS tablet Take 1 tablet (5 mg total) by mouth 2 (two) times daily. 11/17/15  Yes Newman Nip, NP  aspirin EC 81 MG tablet Take 1 tablet (81 mg total) by mouth daily. 10/14/15  Yes Brittainy Sherlynn Carbon, PA-C  atorvastatin (LIPITOR) 80 MG tablet TAKE 1 TABLET (80 MG TOTAL) BY MOUTH DAILY. 04/11/16  Yes Shirline Frees, NP  buPROPion (WELLBUTRIN SR) 150 MG 12 hr tablet Take 1 tablet (150 mg total) by mouth 2 (two) times  daily. 03/29/16  Yes [name] Nafziger, NP  BYSTOLIC 20 MG TABS TAKE 1 TABLET (20 MG TOTAL) BY MOUTH DAILY. 05/16/16  Yes Shirline Frees, NP  citalopram (CELEXA) 20 MG tablet Take 1 tablet (20 mg total) by mouth daily. 06/01/16  Yes Shirline Frees, NP  glipiZIDE (GLUCOTROL) 10 MG tablet Take 1 tablet (10 mg total) by mouth 2 (two) times daily before a meal. Patient taking differently: Take 10 mg by mouth daily before breakfast.  03/29/16  Yes Shirline Frees, NP  glucose blood (ACCU-CHEK ACTIVE STRIPS) test strip Use as instructed 03/29/16  Yes Shirline Frees, NP  insulin aspart (NOVOLOG FLEXPEN) 100 UNIT/ML FlexPen USE 15 TO 30 UNITS 15 MINUTES BEFORE MEALS 3 TIMES DAILY 03/29/16  Yes Shirline Frees, NP  Insulin Glargine (BASAGLAR KWIKPEN) 100 UNIT/ML SOPN Inject 0.2 mLs (20 Units total) into the skin at bedtime. 03/29/16  Yes Shirline Frees, NP  Insulin Pen Needle (PEN NEEDLES 31GX5/16") 31G X 8 MM MISC Use to inject insulin 5 times a day. 03/29/16  Yes Shirline Frees, NP  isosorbide mononitrate (IMDUR) 30 MG 24 hr tablet Take 1 tablet (30 mg total) by mouth daily. 10/31/15  Yes Rollene Rotunda, MD  Lancets (ACCU-CHEK MULTICLIX) lancets Use up to 5 times a day to check blood sugar. 03/29/16  Yes Shirline Frees, NP  metFORMIN (GLUCOPHAGE) 1000 MG tablet Take 1 tablet (1,000 mg total) by mouth 2 (two) times daily with a meal. 03/29/16  Yes Shirline Frees, NP  omeprazole (PRILOSEC) 20 MG capsule TAKE ONE CAPSULE TWICE A DAY BEFORE A MEAL 04/02/16  Yes Shirline Frees, NP  ondansetron (ZOFRAN) 4 MG tablet Take 1 tablet (4 mg total) by mouth every 6 (six) hours as needed for nausea or vomiting. 06/27/16  Yes Lavera Guise, MD  valsartan-hydrochlorothiazide (DIOVAN-HCT) 80-12.5 MG tablet Take 1 tablet by mouth daily. 03/29/16  Yes Shirline Frees, NP    Physical Exam: Vitals:   06/28/16 0300 06/28/16 0315 06/28/16 0345 06/28/16 0624  BP: (!) 124/58 (!) 105/59 116/62 (!) 143/68  Pulse: 77 65 78 79  Resp: (!) 24 (!) 26 (!) 26 20    Temp:    98.2 F (36.8 C)  TempSrc:    Oral  SpO2: 95%  96% 92%  Weight:      Height:          Constitutional: Moderately built and nourished. Vitals:   06/28/16 0300 06/28/16 0315 06/28/16 0345 06/28/16 0624  BP: (!) 124/58 (!) 105/59 116/62 (!) 143/68  Pulse: 77 65 78 79  Resp: (!) 24 (!) 26 (!) 26 20  Temp:    98.2 F (36.8 C)  TempSrc:    Oral  SpO2: 95%  96% 92%  Weight:      Height:       Eyes: Anicteric no pallor. ENMT:  No discharge from the ears eyes nose and mouth. Neck: No mass felt. No neck rigidity. Respiratory: No rhonchi or crepitations. Cardiovascular: S1-S2 heard no murmurs appreciated. Abdomen: Soft nontender bowels are present. Musculoskeletal: No edema. No joint effusion. Skin: No rash. Skin appears warm. Neurologic: Alert awake oriented to time place and person. Moves all extremities 5 x 5. Mild dysdiadochokinesia. No facial asymmetry. Tongue is midline. Pupils are equal and reacting to light. Psychiatric: Appears normal. Normal affect.   Labs on Admission: I have personally reviewed following labs and imaging studies  CBC:  Recent Labs Lab 06/27/16 1807 06/28/16 0052  WBC 9.8 7.6  NEUTROABS 8.4* 6.3  HGB 13.7 12.2*  HCT 43.4 37.9*  MCV 92.7 92.9  PLT 136* 124*   Basic Metabolic Panel:  Recent Labs Lab 06/27/16 1807 06/28/16 0052  NA 138 137  K 4.5 4.5  CL 102 104  CO2 24 24  GLUCOSE 177* 199*  BUN 30* 34*  CREATININE 1.77* 1.94*  CALCIUM 8.7* 8.1*   GFR: Estimated Creatinine Clearance: 43.4 mL/min (A) (by C-G formula based on SCr of 1.94 mg/dL (H)). Liver Function Tests:  Recent Labs Lab 06/27/16 1807 06/28/16 0052  AST 30 28  ALT 21 19  ALKPHOS 102 85  BILITOT 1.1 0.8  PROT 6.4* 5.7*  ALBUMIN 3.8 3.4*   No results for input(s): LIPASE, AMYLASE in the last 168 hours. No results for input(s): AMMONIA in the last 168 hours. Coagulation Profile: No results for input(s): INR, PROTIME in the last 168 hours. Cardiac  Enzymes:  Recent Labs Lab 06/28/16 0308  TROPONINI <0.03   BNP (last 3 results)  Recent Labs  02/15/16 1734  PROBNP 247.0*   HbA1C: No results for input(s): HGBA1C in the last 72 hours. CBG:  Recent Labs Lab 06/27/16 1735  GLUCAP 163*   Lipid Profile: No results for input(s): CHOL, HDL, LDLCALC, TRIG, CHOLHDL, LDLDIRECT in the last 72 hours. Thyroid Function Tests: No results for input(s): TSH, T4TOTAL, FREET4, T3FREE, THYROIDAB in the last 72 hours. Anemia Panel: No results for input(s): VITAMINB12, FOLATE, FERRITIN, TIBC, IRON, RETICCTPCT in the last 72 hours. Urine analysis:    Component Value Date/Time   COLORURINE AMBER (A) 06/27/2016 2350   APPEARANCEUR HAZY (A) 06/27/2016 2350   LABSPEC 1.024 06/27/2016 2350   PHURINE 5.0 06/27/2016 2350   GLUCOSEU 50 (A) 06/27/2016 2350   GLUCOSEU NEGATIVE 05/27/2007 0844   HGBUR NEGATIVE 06/27/2016 2350   HGBUR negative 05/05/2010 1041   BILIRUBINUR NEGATIVE 06/27/2016 2350   KETONESUR 5 (A) 06/27/2016 2350   PROTEINUR 30 (A) 06/27/2016 2350   UROBILINOGEN 0.2 05/05/2010 1041   NITRITE NEGATIVE 06/27/2016 2350   LEUKOCYTESUR NEGATIVE 06/27/2016 2350   Sepsis Labs: (procalcitonin:4,lacticidven:4) )No results found for this or any previous visit (from the past 240 hour(s)).   Radiological Exams on Admission: Dg Chest 2 View  Result Date: 06/27/2016 CLINICAL DATA:  Left-sided chest pain, cough, and chest congestion. Coronary artery disease. EXAM: CHEST  2 VIEW COMPARISON:  02/15/2016 FINDINGS: The heart size and mediastinal contours are within normal limits. Prior CABG. Both lungs are clear. The visualized skeletal structures are unremarkable. IMPRESSION: Stable exam.  No active cardiopulmonary disease. Electronically Signed   By: Myles Rosenthal M.D.   On: 06/27/2016 18:41   Ct Head Wo Contrast  Result Date: 06/28/2016 CLINICAL DATA:  Patient fell on concrete hitting top of head. EXAM: CT HEAD WITHOUT CONTRAST  TECHNIQUE: Contiguous axial images were obtained from the base of the  skull through the vertex without intravenous contrast. COMPARISON:  None. FINDINGS: BRAIN: There mild is sulcal and ventricular prominence consistent with superficial and central atrophy. No intraparenchymal hemorrhage, mass effect nor midline shift. Periventricular and subcortical white matter hypodensities consistent with chronic small vessel ischemic disease are identified. No acute large vascular territory infarcts. No abnormal extra-axial fluid collections. Basal cisterns are not effaced and midline. VASCULAR: Moderate calcific atherosclerosis of the carotid siphons and both vertebral arteries. SKULL: No skull fracture. No significant scalp soft tissue swelling. SINUSES/ORBITS: The mastoid air-cells are clear. The included paranasal sinuses are well-aerated within the left maxillary sinus with partially visualized small mucous retention cystThe included ocular globes and orbital contents are non-suspicious. OTHER: None. IMPRESSION: 1. Chronic appearing small vessel ischemic disease of periventricular white matter with cerebral mild atrophy. 2. No acute intracranial abnormality. Electronically Signed   By: Tollie Eth M.D.   On: 06/28/2016 01:36   Ct Head Wo Contrast  Result Date: 06/27/2016 CLINICAL DATA:  Tripped over a step, hit top of his head, patient takes blood thinner EXAM: CT HEAD WITHOUT CONTRAST TECHNIQUE: Contiguous axial images were obtained from the base of the skull through the vertex without intravenous contrast. COMPARISON:  05/24/2016 FINDINGS: Brain: No intracranial hemorrhage, mass effect or midline shift. No acute cortical infarction. Stable atrophy in chronic small vessel ischemic changes bilateral periventricular and patchy subcortical white matter. No mass lesion is noted on this unenhanced scan. Ventricular size is stable from prior exam. No intraventricular hemorrhage. Vascular: Atherosclerotic calcifications of  carotid siphon again noted. Atherosclerotic calcifications bilateral vertebral arteries. Skull: There is no skull fracture. Sinuses/Orbits: Probable mucous retention cyst partially visualized left maxillary sinus measures 1.4 cm. Other: None IMPRESSION: No acute intracranial abnormality. Stable atrophy and chronic white matter small vessel ischemic changes. No definite acute cortical infarction. Electronically Signed   By: Natasha Mead M.D.   On: 06/27/2016 17:24   Ct Cervical Spine Wo Contrast  Result Date: 06/27/2016 CLINICAL DATA:  71 y/o M; status post fall with head injury. No neck complaints at this time. EXAM: CT CERVICAL SPINE WITHOUT CONTRAST TECHNIQUE: Multidetector CT imaging of the cervical spine was performed without intravenous contrast. Multiplanar CT image reconstructions were also generated. COMPARISON:  None. FINDINGS: Alignment: Straightening of cervical lordosis with mild reversal at the C5-6 level. No listhesis. Slight clockwise rotation of C1 on C2 is likely positional. Skull base and vertebrae: No acute fracture. No primary bone lesion or focal pathologic process. Soft tissues and spinal canal: No prevertebral fluid or swelling. No visible canal hematoma. Disc levels: Moderate cervical spondylosis with with predominantly discogenic degenerative changes at the C6 through T1 levels and left-greater-than-right facet degenerative changes. There are multiple levels of canal stenosis greatest at C6-7 where it is at least moderate. No high-grade bony foraminal narrowing. Extensive productive changes of the anterior C1-2 articulation. Upper chest: Negative. Other: Moderate calcific atherosclerosis of the carotid bifurcations bilaterally. Otherwise negative. IMPRESSION: 1. No acute fracture or dislocation is identified. 2. Moderate cervical spondylosis greatest at C6-7 where there is at least moderate canal stenosis. Electronically Signed   By: Mitzi Hansen M.D.   On: 06/27/2016 18:41    Mr Brain Wo Contrast  Result Date: 06/28/2016 CLINICAL DATA:  Ataxia and vomiting EXAM: MRI HEAD WITHOUT CONTRAST TECHNIQUE: Multiplanar, multiecho pulse sequences of the brain and surrounding structures were obtained without intravenous contrast. COMPARISON:  Head CT 06/28/2016 Brain MRI 02/16/2013 FINDINGS: Brain: There is a punctate focus of diffusion restriction within the posterior  left frontal white matter, near the base of the left central sulcus. There is beginning confluent hyperintense T2-weighted signal within the periventricular white matter, most often seen in the setting of chronic microvascular ischemia. Old lacunar infarct in the superior left frontal white matter is unchanged. No mass lesion or midline shift. No hydrocephalus or extra-axial fluid collection. The midline structures are normal. No age advanced or lobar predominant atrophy. Vascular: Major intracranial arterial and venous sinus flow voids are preserved. No evidence of chronic microhemorrhage or amyloid angiopathy. Skull and upper cervical spine: The visualized skull base, calvarium, upper cervical spine and extracranial soft tissues are normal. Sinuses/Orbits: No fluid levels or advanced mucosal thickening. No mastoid effusion. Normal orbits. IMPRESSION: 1. Punctate focus of acute ischemia within the posterior left frontal white matter without hemorrhage or mass effect. 2. Chronic microvascular ischemia and old lacunar infarcts, unchanged compared to the 02/16/2013. Electronically Signed   By: Deatra Robinson M.D.   On: 06/28/2016 06:02    EKG: Independently reviewed. A. fib with nonspecific history changes.  Assessment/Plan Principal Problem:   Ischemic stroke of frontal lobe (HCC) Active Problems:   DM (diabetes mellitus) type II uncontrolled with retinopathy   OSA (obstructive sleep apnea)   Essential hypertension   Coronary atherosclerosis   Atrial fibrillation (HCC)   Nausea & vomiting   Thrombocytopenia (HCC)    Renal failure (ARF), acute on chronic (HCC)   Stroke (cerebrum) (HCC)    1. Left frontal stroke - discuss with on-call neurologist Dr. Amada Jupiter and neurology will be seeing patient in consult. Neurology is recommended to continue Apixaban. Sotalol evaluation is pending. Continue neuro checks. Check MRA brain 2-D echo carotid Doppler hemoglobin A1c and lipid panel. 2. Acute on chronic renal failure stage III probably from nausea vomiting. UA is unremarkable. Continued hydration. Hold ARB and hydrochlorothiazide. 3. Persistent nausea vomiting - patient states it has improved for last few hours. Abdomen appears benign. Likely could be viral gastroenteritis given patient's wife also has same symptoms. 4. Paroxysmal atrial fibrillation - rate controlled chads 2 vasc score is more than 2 patient is on Apixaban. On metoprolol. 5. CAD status post CABG - denies any chest pain. 6. Diabetes mellitus type 2 - will hold metformin and glipizide since patient is having acute renal failure. Continue Lantus with pre-meal NovoLog and sliding scale coverage. 7. Thrombocytopenia - appears to be new. Patient also has worsening renal function. Patient is afebrile. Will check LDH to rule out any hemolytic process. We'll also check peripheral smear. 8. Anemia - check anemia panel. Follow LDH to rule out hemolytic process.   DVT prophylaxis: Apixaban. Code Status: Full code.  Family Communication: Discussed with patient.  Disposition Plan: Home.  Consults called: Neurology.  Admission status: Observation.    Eduard Clos MD [facility] Pager [phone].  If 7PM-7AM, please contact night-coverage [email or web address] Password [facility]  06/28/2016, 6:55 AM

## 2016-06-28 NOTE — Progress Notes (Signed)
Preliminary results by tech - Carotid Duplex Completed. Mild to moderate heterogeneous plaque in bilateral carotid arteries suggestive of less than 50% stenosis. Vertebral arteries demonstrated antegrade flow.

## 2016-06-28 NOTE — ED Notes (Signed)
Pt unable to tolerate walking through the hallway. Gait unsteady with 2 person assist. Physician notified

## 2016-06-28 NOTE — Evaluation (Signed)
Occupational Therapy Evaluation Patient Details Name: Luis Yu MRN: [id number] DOB: 07/02/1945 Today's Date: 06/28/2016    History of Present Illness Pt is a 71 y.o. male presenting after fall and persistent nausea and vomiting. MRI on 4/12 + for acute ischemia involving the L frontal lobe. PMHx: Atrial flutter, CAD, Depression, DM2, Diabetic peripheral neuropathy, GERD, Herpes zoster, Hyperlipidemia, HTN, Obesity, Osteoarthritis, Sleep apnea, Cardioversion x2 in 2017, CABG 1996, bil TKA.   Clinical Impression   Pt reports he was managing ADL at mod I level PTA; recently has been needing increased assist from his wife due to decreased activity tolerance and fatigue. Currently pt min assist for sit to stand at EOB and mod-max assist for ADL. Pt presenting with significant decreased activity tolerance/deconditioning, hx of 5-6 falls in the past 6 months, generalized weakness, ?impaired cognition, poor sitting/standing balance, and reports dizziness with standing activities impacting his independence and safety with ADL and functional mobility. Recommending CIR level therapies to maximize independence and safety with ADL and functional mobility prior to return home. Pt would benefit from continued skilled OT to address established goals.    Follow Up Recommendations  CIR;Supervision/Assistance - 24 hour    Equipment Recommendations  Other (comment) (TBD at next venue)    Recommendations for Other Services Rehab consult;PT consult     Precautions / Restrictions Precautions Precautions: Fall Restrictions Weight Bearing Restrictions: No      Mobility Bed Mobility Overal bed mobility: Needs Assistance Bed Mobility: Sit to Supine       Sit to supine: Min assist   General bed mobility comments: Min assist for controlled descent of trunk to supine. Pt moves quickly and is off balance with movement.  Transfers Overall transfer level: Needs assistance Equipment used: Rolling walker  (2 wheeled) Transfers: Sit to/from Stand Sit to Stand: Min assist         General transfer comment: Min assist for balance in standing.    Balance Overall balance assessment: Needs assistance;History of Falls Sitting-balance support: Feet supported;No upper extremity supported Sitting balance-Leahy Scale: Poor Sitting balance - Comments: Min guard-max assist needed to maintain sitting balance. Seems to have very poor balance with strong posterior lean when participating in functional activities. R lean noted at times too. Postural control: Posterior lean;Right lateral lean Standing balance support: Bilateral upper extremity supported Standing balance-Leahy Scale: Poor Standing balance comment: Min assist and bil UE support throughout static standing.                           ADL either performed or assessed with clinical judgement   ADL Overall ADL's : Needs assistance/impaired Eating/Feeding: Set up;Sitting   Grooming: Minimal assistance;Sitting   Upper Body Bathing: Moderate assistance;Sitting   Lower Body Bathing: Maximal assistance;Sit to/from stand   Upper Body Dressing : Moderate assistance;Sitting   Lower Body Dressing: Maximal assistance;Sit to/from stand Lower Body Dressing Details (indicate cue type and reason): When attempting to adjust socks sitting EOB pt with strong posterior lean/falling posteriorly.               General ADL Comments: Pt tolerated standing x2 for ~1 minute each; then quickly sits back down reporting dizziness and fatigue. Pt with SOB with minimal activity; BP 130/110, HR 113.  Pt reports he noticies that he leans/falls backwards often; 5-6 falls in the past 6 months.     Vision   Additional Comments: Needs further assessment     Perception  Praxis      Pertinent Vitals/Pain Pain Assessment: No/denies pain     Hand Dominance     Extremity/Trunk Assessment Upper Extremity Assessment Upper Extremity Assessment:  Generalized weakness (intention tremor)   Lower Extremity Assessment Lower Extremity Assessment: Defer to PT evaluation   Cervical / Trunk Assessment Cervical / Trunk Assessment: Kyphotic   Communication Communication Communication: Expressive difficulties (occasional word finding difficulties)   Cognition Arousal/Alertness: Awake/alert Behavior During Therapy: Impulsive Overall Cognitive Status: No family/caregiver present to determine baseline cognitive functioning Area of Impairment: Following commands;Safety/judgement;Problem solving                       Following Commands: Follows one step commands consistently;Follows one step commands with increased time Safety/Judgement: Decreased awareness of safety   Problem Solving: Requires verbal cues;Requires tactile cues     General Comments       Exercises     Shoulder Instructions      Home Living Family/patient expects to be discharged to:: Private residence Living Arrangements: Spouse/significant other Available Help at Discharge: Family;Available 24 hours/day Type of Home: House Home Access: Stairs to enter [employer] of Steps: 3   Home Layout: One level     Bathroom Shower/Tub: Producer, television/film/video: Handicapped height     Home Equipment: Environmental consultant - 4 wheels;Shower seat          Prior Functioning/Environment Level of Independence: Independent with assistive device(s)        Comments: rollator for all mobility. Independent with BADL; recently wife has been needing to assist minimally when pt fatigues        OT Problem List: Decreased strength;Decreased activity tolerance;Impaired balance (sitting and/or standing);Decreased coordination;Decreased cognition;Decreased safety awareness;Decreased knowledge of use of DME or AE;Cardiopulmonary status limiting activity;Obesity      OT Treatment/Interventions: Self-care/ADL training;Therapeutic exercise;Neuromuscular  education;DME and/or AE instruction;Energy conservation;Therapeutic activities;Cognitive remediation/compensation;Patient/family education;Balance training    OT Goals(Current goals can be found in the care plan section) Acute Rehab OT Goals Patient Stated Goal: get into a rehab program before returning home OT Goal Formulation: With patient Time For Goal Achievement: 07/12/16 Potential to Achieve Goals: Good ADL Goals Pt Will Perform Grooming: with min guard assist;standing Pt Will Perform Upper Body Bathing: with supervision;sitting Pt Will Perform Lower Body Bathing: with min guard assist;sit to/from stand Pt Will Transfer to Toilet: with min assist;ambulating;bedside commode (over toilet) Pt Will Perform Toileting - Clothing Manipulation and hygiene: with min guard assist;sit to/from stand Pt/caregiver will Perform Home Exercise Program: Increased strength;Both right and left upper extremity;With theraband;With written HEP provided  OT Frequency: Min 3X/week   Barriers to D/C:            Co-evaluation              End of Session Equipment Utilized During Treatment: Gait belt;Rolling walker  Activity Tolerance: Patient tolerated treatment well;Patient limited by fatigue Patient left: in bed;with call bell/phone within reach;with bed alarm set  OT Visit Diagnosis: Unsteadiness on feet (R26.81);Other abnormalities of gait and mobility (R26.89);Repeated falls (R29.6);Muscle weakness (generalized) (M62.81);History of falling (Z91.81);Dizziness and giddiness (R42)                Time: 1610-9604 OT Time Calculation (min): 27 min Charges:  OT General Charges $OT Visit: 1 Procedure OT Evaluation $OT Eval Moderate Complexity: 1 Procedure OT Treatments $Therapeutic Activity: 8-22 mins G-Codes:    Ginnette Gates A. Brett Albino, M.S., OTR/L Pager: [phone]  Gaye Alken 06/28/2016,  10:05 AM

## 2016-06-28 NOTE — Progress Notes (Signed)
Pt arrived to floor with tech. He's alert and orient with no complaints of any pain. He is resting comfortably in bed with call bell within reach. MD has been paged. Will continue to monitor closely.

## 2016-06-28 NOTE — ED Provider Notes (Signed)
Luis Yu is a 71 y.o. male  1:22 AM Assumed care from Dr. Verdie Mosher.  Patient with persistent vomiting after fall while hitting head on Eliquis. Wife also with similar extraintestinal symptoms. CT had earlier was negative. Repeat CT head pending as patient has difficulty walking which is new  2:12 AM Pt reassessed. Pt unsteady when ambulating which he states is new for him. He states he feels weak and short of breath when he tries to walk but denies SOB at this time.  Pt has vomited once in the ED; no episodes of diarrhea in the ED. No abdominal pain. Nausea has resolved at this time. On exam pt is mildly tremulous, has 5/5 strength throughout;  no ataxia on finger to nose.  Pt will be admitted for generalized weakness and unsteady gait.   2:30 AM Discussed case with Dr. Toniann Fail (hospitalist) who would like MRI to be ordered   By signing my name below, I, Freida Busman, attest that this documentation has been prepared under the direction and in the presence of Glynn Octave, MD . Electronically Signed: Freida Busman, Scribe. 06/28/2016. 3:01 AM.  I personally performed the services described in this documentation, which was scribed in my presence. The recorded information has been reviewed and is accurate.     Glynn Octave, MD 06/28/16 [phone]

## 2016-06-28 NOTE — ED Notes (Signed)
Patient transported to MRI 

## 2016-06-28 NOTE — Progress Notes (Signed)
Patient seen and examined  71 y.o. male with history of CAD status post CABG, chronic atrial fibrillation, hypertension, diabetes mellitus and sleep apnea was referred to the ER by patient's primary care physician yesterday after patient had a fall at the office. Patient states 2 days ago patient started having persistent nausea vomiting which improved by evening 2 days ago. History patient had gone to the PCPs office and had loss of balance and fell.   MRI of the brain which shows acute ischemia involving the left frontal lobe. Patient admitted for acute stroke  Assessment/Plan 1. Left frontal stroke - discuss with on-call neurologist Dr. Amada Jupiter and neurology will be seeing patient in consult. Neurology is recommended to continue Apixaban. Stroke/swallow evaluation is pending. Continue neuro checks. Check MRA brain 2-D echo carotid Doppler hemoglobin A1c and lipid panel. 2. Acute on chronic renal failure stage III -baseline creatinine 1.15. Creatinine 1.77 on admission, now 1.94. probably from nausea vomiting. UA is unremarkable. Continued hydration. Hold ARB and hydrochlorothiazide. Hydrate patient with IV fluids 3. Persistent nausea vomiting - patient states it has improved for last few hours. Abdomen appears benign. Likely could be viral gastroenteritis given patient's wife also has same symptoms. Will obtain abdominal KUB 4. Paroxysmal atrial fibrillation - rate controlled chads 2 vasc score is more than 2 patient is on Apixaban. On metoprolol. 5. CAD status post CABG - denies any chest pain. 6. Diabetes mellitus type 2 - will hold metformin and glipizide since patient is having acute renal failure. Continue Lantus with pre-meal NovoLog and sliding scale coverage. 7. Thrombocytopenia - appears to be new. Patient also has worsening renal function. Patient is afebrile. Will check LDH to rule out any hemolytic process. We'll also check peripheral smear. 8. Anemia - check anemia panel. Follow LDH  to rule out hemolytic process 9. FTT -multifactorial, check CK, vitamin B-12, TSH,

## 2016-06-28 NOTE — Progress Notes (Signed)
Rehab Admissions Coordinator Note:  Patient was screened by Trish Mage for appropriateness for an Inpatient Acute Rehab Consult.  At this time, we are recommending Inpatient Rehab consult.  Trish Mage 06/28/2016, 10:31 AM  I can be reached at [phone].

## 2016-06-28 NOTE — Progress Notes (Addendum)
PT Cancellation Note  Patient Details Name: Luis Yu MRN: [id number] DOB: 04/03/1945   Cancelled Treatment:    Reason Eval/Treat Not Completed: Patient at procedure or test/unavailable. Pt in ultrasound. PT to re-attempt eval as time allows.  Addendum 1246: PT eval re-attempted. Pt now out of room in MRI.   Ilda Foil 06/28/2016, 10:02 AM

## 2016-06-28 NOTE — Progress Notes (Addendum)
STROKE TEAM PROGRESS NOTE   HISTORY OF PRESENT ILLNESS (per record) Luis Yu is an 71 y.o. male who was referred to the ER by patient's primary care physician yesterday after patient had a fall at the office. Patient states 2 days ago patient started having persistent nausea vomiting which improved by evening 2 days ago. In the ER patient had CT of the head and neck which was unremarkable and was discharged home. While waiting for his wife outside the patient again had started having nausea vomiting. A repeat CT head was done which was negative. Patient was found to have ataxia and patient had MRI of the brain which shows acute ischemia involving the left frontal lobe.   Initially when consulting with the patient, patient says he feels much better. In talking to the patient, he is retired and states that he has a very non-active lifestyle and does not do any exercise at home. Occupational therapy was in the room and thus I tried to get him up to walk him. Upon standing patient became very short of breath and could only stand for approximately 1 minute without feeling as if he was very dizzy and going to faint needing to sit down. Initial blood pressures were taken initially his blood pressure was 130/110 with a pulse of 114, 5 seconds later I took his blood pressure again it was 136/53 with a pulse of 75 at this point he was short of breath and had a sit down. Patient appears extremely deconditioned and when asked if he felt deconditioned he did admit that he is deconditioned.  He was last known well for 01/05/2017, time unable to be determined. He was not administered IV TPA secondary to being outside of the window.   SUBJECTIVE (INTERVAL HISTORY) No family at bedside. Pt much awake alert and is able to stand up at bedside for urination using urinal. He is orientated and neuro intact.    OBJECTIVE Temp:  [97.7 F (36.5 C)-98.2 F (36.8 C)] 98.2 F (36.8 C) (04/12 1343) Pulse Rate:   [56-106] 82 (04/12 1343) Cardiac Rhythm: Atrial fibrillation;Other (Comment) (04/12 0842) Resp:  [16-33] 18 (04/12 1343) BP: (105-152)/(57-94) 135/78 (04/12 1343) SpO2:  [92 %-100 %] 98 % (04/12 1343) Weight:  [99.8 kg (220 lb)] 99.8 kg (220 lb) (04/11 2141)  CBC:  Recent Labs Lab 06/28/16 0052 06/28/16 0718  WBC 7.6 6.3  NEUTROABS 6.3 4.8  HGB 12.2* 12.1*  HCT 37.9* 38.1*  MCV 92.9 92.9  PLT 124* 121*    Basic Metabolic Panel:  Recent Labs Lab 06/28/16 0052 06/28/16 0821  NA 137 137  K 4.5 4.0  CL 104 104  CO2 24 22  GLUCOSE 199* 165*  BUN 34* 33*  CREATININE 1.94* 1.61*  CALCIUM 8.1* 8.0*    Lipid Panel:    Component Value Date/Time   CHOL 96 06/28/2016 0718   TRIG 88 06/28/2016 0718   HDL 28 (L) 06/28/2016 0718   CHOLHDL 3.4 06/28/2016 0718   VLDL 18 06/28/2016 0718   LDLCALC 50 06/28/2016 0718   HgbA1c:  Lab Results  Component Value Date   HGBA1C 6.9 04/24/2016   Urine Drug Screen: No results found for: LABOPIA, COCAINSCRNUR, LABBENZ, AMPHETMU, THCU, LABBARB  Alcohol Level No results found for: [facility]  IMAGING I have personally reviewed the radiological images below and agree with the radiology interpretations.  Ct Head Wo Contrast 06/28/2016 1. Chronic appearing small vessel ischemic disease of periventricular white matter with cerebral mild atrophy. 2.  No acute intracranial abnormality.  06/27/2016 No acute intracranial abnormality. Stable atrophy and chronic white matter small vessel ischemic changes. No definite acute cortical infarction.   Ct Cervical Spine Wo Contrast 06/27/2016 1. No acute fracture or dislocation is identified. 2. Moderate cervical spondylosis greatest at C6-7 where there is at least moderate canal stenosis.   Mr Brain Wo Contrast 06/28/2016 1. Punctate focus of acute ischemia within the posterior left frontal white matter without hemorrhage or mass effect. 2. Chronic microvascular ischemia and old lacunar infarcts, unchanged  compared to the 02/16/2013.   Mr Maxine Glenn Head/brain Wo Cm 06/28/2016 1. No acute finding. 2. Intracranial atherosclerotic changes with mild bilateral supraclinoid ICA narrowing.   Carotid Doppler Mild to moderate heterogeneous plaque in bilateral carotid arteries suggestive of less than 50% stenosis. Vertebral arteries demonstrated antegrade flow.   EEG  This awake and drowsy EEG is normal.   A normal EEG does not exclude a clinical diagnosis of epilepsy.  If further clinical questions remain, prolonged EEG may be helpful.  Clinical correlation is advised  TTE pending   PHYSICAL EXAM  Temp:  [98.3 F (36.8 C)-98.6 F (37 C)] 98.4 F (36.9 C) (04/13 1433) Pulse Rate:  [64-78] 78 (04/13 0624) Resp:  [18] 18 (04/13 0624) BP: (117-148)/(71-89) 128/71 (04/13 1433) SpO2:  [93 %-97 %] 93 % (04/13 0624)  General - Well nourished, well developed, in mild lethargy  Ophthalmologic - Fundi not visualized due to noncooperation.  Cardiovascular - irregularly irregular heart rate and rhythm.  Mental Status -  Level of arousal and orientation to time, place, and person were intact. Language including expression, naming, repetition, comprehension was assessed and found intact, but psychomotor slowing. Fund of Knowledge was assessed and was impaired.  Cranial Nerves II - XII - II - Visual field intact OU. III, IV, VI - Extraocular movements intact. V - Facial sensation intact bilaterally. VII - Facial movement intact bilaterally. VIII - Hearing & vestibular intact bilaterally. X - Palate elevates symmetrically. XI - Chin turning & shoulder shrug intact bilaterally. XII - Tongue protrusion intact.  Motor Strength - The patient's strength was symmetrical in all extremities and pronator drift was absent.  Bulk was normal and fasciculations were absent.   Motor Tone - Muscle tone was assessed at the neck and appendages and was normal.  Reflexes - The patient's reflexes were 1+ in all  extremities and he had no pathological reflexes.  Sensory - Light touch, temperature/pinprick were assessed and were symmetrical.    Coordination - The patient had normal movements in the hands with no ataxia or dysmetria, but slow in action.  Tremor was absent.  Gait and Station - able to stand up without assistance, gait deferred   ASSESSMENT/PLAN Mr. TOMER CHALMERS is a 71 y.o. male with history of CAD s/p CABG, HTN, DM and OSA presenting with multiple falls, nausea, vomiting and dizziness. He did not receive IV t-PA due to being outside of the window.   Deconditioning   Likely multifactorial, associated with AKI on CKD, dehydration due to N/V, orthostatic hypotension, cognitive impairment, hyperglycemia and low B-12 level  Much improved after treatment  PT/OT recommend CIR  Ammonia level normal  EEG normal  Treatment per primary team  Stroke:   Incidental left frontal tiny infarct secondary to small vessel source, not able to explain patient presenting symptoms  CT head 4/11 . No acute abnormality. Atrophy. Small vessel disease.  CT C-spine no fracture. Cervical spondylosis with moderate canal stenosis C6-7  Repeat CT 4/12 . Small vessel disease. Atrophy. No acute abnormality  MRI  left frontal punctate infarct. Small vessel disease. Old lacunar infarcts.  MRA  no acute finding. Atherosclerosis  Carotid Doppler  B ICA < 50% stenosis, VA antegrade  2D Echo  pending  LDL 50  HgbA1c 7.6  eliquis for VTE prophylaxis  Diet heart healthy/carb modified Room service appropriate? Yes; Fluid consistency: Thin  aspirin 81 mg daily and Eliquis (apixaban) daily prior to admission, now on aspirin 81 mg daily and Eliquis (apixaban) daily. Continue eliquis and aspirin on discharge  Patient counseled to be compliant with his antithrombotic medications  Ongoing aggressive stroke risk factor management  Therapy recommendations:  CIR. rehabilitation consultation  placed  Disposition:  pending   Paroxysmal atrial fibrillation/hx atrial flutter  On Eliquis, prior to admission, continued in hospital  Also on ASA PTA  Continue aspirin and eliquis  Hypertension  Stable  Hyperlipidemia  Home meds:  Lipitor 80, resumed in hospital  LDL 50, goal < 70  Continue statin at discharge  Diabetes, type II  HgbA1c 7.6, goal < 7.0  Uncontrolled  SSI  On Lantus  CBG monitoring  Close follow-up with PCP  Other Stroke Risk Factors  Advanced age  ETOH use, advised to drink no more than 2 drink(s) a day  Family hx stroke (brother and half-brother)  Coronary artery disease s/p CABG  Obstructive sleep apnea, on CPAP at home  Other Active Problems  Acute on chronic renal failure stage III  persistent nausea and vomiting  Thrombocytopenia  Anemia  Failure to thrive  Follows with [facility] Everlena Cooper [facility] day # 0  Neurology will sign off. Please call with questions. Follow-up with Dr. Everlena Cooper at [facility] in 4 weeks. Thanks for the consult.  Marvel Plan, MD PhD Stroke Neurology 06/29/2016 5:56 PM   To contact Stroke Continuity provider, please refer to [email or web address]. After hours, contact General Neurology

## 2016-06-29 ENCOUNTER — Inpatient Hospital Stay (HOSPITAL_COMMUNITY): Payer: Medicare Other

## 2016-06-29 ENCOUNTER — Encounter (HOSPITAL_COMMUNITY): Payer: Self-pay | Admitting: *Deleted

## 2016-06-29 DIAGNOSIS — N179 Acute kidney failure, unspecified: Secondary | ICD-10-CM

## 2016-06-29 DIAGNOSIS — I639 Cerebral infarction, unspecified: Principal | ICD-10-CM

## 2016-06-29 DIAGNOSIS — R270 Ataxia, unspecified: Secondary | ICD-10-CM

## 2016-06-29 LAB — COMPREHENSIVE METABOLIC PANEL
ALT: 19 U/L (ref 17–63)
AST: 28 U/L (ref 15–41)
Albumin: 3.1 g/dL — ABNORMAL LOW (ref 3.5–5.0)
Alkaline Phosphatase: 82 U/L (ref 38–126)
Anion gap: 6 (ref 5–15)
BUN: 30 mg/dL — ABNORMAL HIGH (ref 6–20)
CO2: 23 mmol/L (ref 22–32)
Calcium: 7.9 mg/dL — ABNORMAL LOW (ref 8.9–10.3)
Chloride: 107 mmol/L (ref 101–111)
Creatinine, Ser: 1.19 mg/dL (ref 0.61–1.24)
GFR calc Af Amer: >60 mL/min (ref >60)
GFR calc non Af Amer: 59 mL/min — ABNORMAL LOW (ref >60)
Glucose, Bld: 181 mg/dL — ABNORMAL HIGH (ref 65–99)
Potassium: 3.8 mmol/L (ref 3.5–5.1)
Sodium: 136 mmol/L (ref 135–145)
Total Bilirubin: 0.6 mg/dL (ref 0.3–1.2)
Total Protein: 5.2 g/dL — ABNORMAL LOW (ref 6.5–8.1)

## 2016-06-29 LAB — CBC
HCT: 35 % — ABNORMAL LOW (ref 39.0–52.0)
Hemoglobin: 11.3 g/dL — ABNORMAL LOW (ref 13.0–17.0)
MCH: 29.8 pg (ref 26.0–34.0)
MCHC: 32.3 g/dL (ref 30.0–36.0)
MCV: 92.3 fL (ref 78.0–100.0)
Platelets: 101 10*3/uL — ABNORMAL LOW (ref 150–400)
RBC: 3.79 MIL/uL — ABNORMAL LOW (ref 4.22–5.81)
RDW: 16.2 % — ABNORMAL HIGH (ref 11.5–15.5)
WBC: 6.9 10*3/uL (ref 4.0–10.5)

## 2016-06-29 LAB — GLUCOSE, CAPILLARY
Glucose-Capillary: 157 mg/dL — ABNORMAL HIGH (ref 65–99)
Glucose-Capillary: 143 mg/dL — ABNORMAL HIGH (ref 65–99)
Glucose-Capillary: 119 mg/dL — ABNORMAL HIGH (ref 65–99)

## 2016-06-29 LAB — PATHOLOGIST SMEAR REVIEW

## 2016-06-29 LAB — AMMONIA: Ammonia: 31 umol/L (ref 9–35)

## 2016-06-29 MED ORDER — CYANOCOBALAMIN 1000 MCG/ML IJ SOLN: 1000.0000 ug | Freq: Once | INTRAMUSCULAR | Status: DC

## 2016-06-29 MED ORDER — CYANOCOBALAMIN 1000 MCG/ML IJ SOLN
1000.0000 ug | Freq: Every morning | INTRAMUSCULAR | Status: DC
  Administered 2016-06-29 – 2016-07-01 (×3): 1000 ug via INTRAMUSCULAR
  Filled 2016-06-29 (×3): qty 1

## 2016-06-29 MED ORDER — VITAMIN B-1 100 MG PO TABS
100.0000 mg | ORAL_TABLET | Freq: Every day | ORAL | Status: DC
  Administered 2016-06-29 – 2016-07-02 (×4): 100 mg via ORAL
  Filled 2016-06-29 (×4): qty 1

## 2016-06-29 NOTE — Procedures (Signed)
ELECTROENCEPHALOGRAM REPORT  Date of Study: 06/29/2016  Patient's Name: Luis Yu MRN: [id number] Date of Birth: 12-11-1945  Referring Provider: Marvel Plan, MD  Clinical History: 71 year old man with left frontal ischemic stroke  Medications: acetaminophen (TYLENOL) suppository 650 mgapixaban (ELIQUIS) tablet 5 mg  aspirin EC tablet 81 mg  atorvastatin (LIPITOR) tablet 80 mg  buPROPion (WELLBUTRIN SR) 12 hr tablet 150 mgcitalopram (CELEXA) tablet 20 mg  insulin aspart (novoLOG)  insulin glargine (LANTUS) injection 20 Units  isosorbide mononitrate (IMDUR) 24 hr tablet 30 mg  nebivolol (BYSTOLIC) tablet 20 mg  pantoprazole (PROTONIX) EC tablet 40 mg   Technical Summary: A multichannel digital EEG recording measured by the international 10-20 system with electrodes applied with paste and impedances below 5000 ohms performed in our laboratory with EKG monitoring in an awake and drowsy patient.  Hyperventilation and photic stimulation were not performed.  The digital EEG was referentially recorded, reformatted, and digitally filtered in a variety of bipolar and referential montages for optimal display.    Description: The patient is awake and drowsy during the recording.  During maximal wakefulness, there is a symmetric, medium voltage 8 Hz posterior dominant rhythm that attenuates with eye opening.  The record is symmetric.  During drowsiness, there is an increase in theta slowing of the background.  Stage 2 sleep was not seen.  There were no epileptiform discharges or electrographic seizures seen.    EKG lead was unremarkable.  Impression: This awake and drowsy EEG is normal.    Clinical Correlation: A normal EEG does not exclude a clinical diagnosis of epilepsy.  If further clinical questions remain, prolonged EEG may be helpful.  Clinical correlation is advised.   Shon Millet, DO

## 2016-06-29 NOTE — Consult Note (Signed)
[facility] CM Primary Care Navigator  06/29/2016  Luis Yu 11-29-1945 [id number]   Met with patient and wife Luis Yu) at the bedside to identify possible discharge needs. Wife reports that patient had a fall at the primary care provider's office and was sent to the ED which had led to this admission with an MRI result positive for stroke.  Patient's wife endorses Beaulah Dinning, NP with [facility] [place] [facility] as the primary care provider.    Wife shared using [facility] in [place] to obtain medications without problem so far ("only when in "donut hole"). She states that primary care provider and cardiologist helps by providing samples when in "donut hole".   Patient's wife manages his medications at home.   His wife provides transportation to his doctors' appointments.  Patient's wife is his primary caregiver at home as stated.  Discharge plan is [facility]) to improve balance before returning back home.  Patient and wife voiced understanding to call primary care provider's office when he returns home, for a post discharge follow-up appointment within a week or sooner if needed.Patient letter (with PCP's contact number) was provided as theirreminder.  Explained to patient/ wife about [facility] CM services available for health management and she states that DM is controlled andmanaged at home with oral medications and insulin (pen). PCP is monitoring diabetes every 3 months withlast A1c of 6.9 on 2/18 as stated. [facility] care management contact information provided for future needs that may arise.   For additional questions please contact:  Edwena Felty A. Jeraline Marcinek, BSN, RN-BC [facility] PRIMARY CARE Navigator Cell: [phone]

## 2016-06-29 NOTE — Progress Notes (Signed)
EEG completed; results pending.    

## 2016-06-29 NOTE — Progress Notes (Signed)
[facility] PROGRESS NOTE  Luis Yu ZOX:[id number] DOB: 06/21/1945 DOA: 06/27/2016   PCP: Shirline Frees, NP     Assessment/Plan: Principal Problem:   Ischemic stroke of frontal lobe (HCC) Active Problems:   DM (diabetes mellitus) type II uncontrolled with retinopathy   OSA (obstructive sleep apnea)   Essential hypertension   Coronary atherosclerosis   Atrial fibrillation (HCC)   Nausea & vomiting   Thrombocytopenia (HCC)   Renal failure (ARF), acute on chronic (HCC)   Stroke (cerebrum) (HCC)   Acute CVA (cerebrovascular accident) (HCC)   Acute kidney injury (HCC)    71 y.o.malewith history of CAD status post CABG, chronic atrial fibrillation, hypertension, diabetes mellitus and sleep apnea was referred to the ER by patient's primary care physician yesterday after patient had a fall at the office. Patient states 2 days ago patient started having persistent nausea vomiting which improved by evening 2 days ago. History patient had gone to the PCPs office and had loss of balance and fell.   MRI of the brain which shows acute ischemia involving the left frontal lobe. Patient admitted for acute stroke  Assessment/Plan 1. Left frontal stroke -   neurology following . Neurology is recommended to continue Apixaban. Stroke/swallow evaluation heart healthy. Continue neuro checks.  MRA brain negative,  2-D echo pending,  carotid Doppler-Mild to moderate heterogeneous plaque in bilateral carotid arteries suggestive of less than 50% stenosis ,  hemoglobin A1c and lipid panel-LDL 50. 2. Acute on chronic renal failure stage III -baseline creatinine 1.15. Creatinine is back to baseline 1.19. Creatinine 1.77 on admission, now 1.94>1.19. probably from nausea vomiting prior to admission. UA is unremarkable. Continued hydration. Hold ARB and hydrochlorothiazide.   3. Persistent nausea vomiting - patient states it has improved , tolerating diet. Abdomen appears benign. Likely could be  viral gastroenteritis given patient's wife also has same symptoms.   4. Paroxysmal atrial fibrillation - rate controlled chads 2 vasc score is more than 2 patient is on Apixaban. On metoprolol. 5. CAD status post CABG - denies any chest pain. Troponin negative 2 6. Diabetes mellitus type 2 - will hold metformin and glipizide since patient is having acute renal failure. Continue Lantus with pre-meal NovoLog and sliding scale coverage. Hemoglobin A1c 7.6 7. Thrombocytopenia - appears to be new. Patient also has worsening renal function. Patient is afebrile. Will check LDH to rule out any hemolytic process. We'll also check peripheral smear. 8. Anemia - check anemia panel. Follow LDH to rule out hemolytic process 9. FTT -multifactorial, CK slightly elevated, vitamin B-12- 214 , TSH normal. Started on B-12 supplementation 10. Weight loss-patient had an CT scan last month that did not show any obvious malignancy. He will need age-appropriate cancer screening by PCP. Findings of the CT scan were discussed with the patient's wife  DVT prophylaxsis eliquis  Code Status:  Full code      Family Communication: Discussed in detail with the patient/wife, all imaging results, lab results explained to the patient   Disposition Plan: CIR     Consultants:  neurology  Procedures:  None   Antibiotics: Anti-infectives    None         HPI/Subjective: Patient appears to be comfortable, speech is intact, no gross focal neurologic deficits noted  Objective: Vitals:   06/28/16 1530 06/28/16 1730 06/28/16 2108 06/29/16 0624  BP: 110/63 128/71 117/82 (!) 148/89  Pulse:  69 64 78  Resp:   18 18  Temp:  98.3 F (36.8 C) 98.6 F (37 C)  TempSrc:   Oral Oral  SpO2: 95% 95% 97% 93%  Weight:      Height:        Intake/Output Summary (Last 24 hours) at 06/29/16 1142 Last data filed at 06/28/16 2325  Gross per 24 hour  Intake           773.33 ml  Output              650 ml  Net            123.33 ml    Exam:  Examination:  General exam: Appears calm and comfortable  Respiratory system: Clear to auscultation. Respiratory effort normal. Cardiovascular system: S1 & S2 heard, RRR. No JVD, murmurs, rubs, gallops or clicks. No pedal edema. Gastrointestinal system: Abdomen is nondistended, soft and nontender. No organomegaly or masses felt. Normal bowel sounds heard. Central nervous system: Alert and oriented. No focal neurological deficits. Extremities: Symmetric 5 x 5 power. Skin: No rashes, lesions or ulcers Psychiatry: Judgement and insight appear normal. Mood & affect appropriate.     Data Reviewed: I have personally reviewed following labs and imaging studies  Micro Results No results found for this or any previous visit (from the past 240 hour(s)).  Radiology Reports Dg Chest 2 View  Result Date: 06/27/2016 CLINICAL DATA:  Left-sided chest pain, cough, and chest congestion. Coronary artery disease. EXAM: CHEST  2 VIEW COMPARISON:  02/15/2016 FINDINGS: The heart size and mediastinal contours are within normal limits. Prior CABG. Both lungs are clear. The visualized skeletal structures are unremarkable. IMPRESSION: Stable exam.  No active cardiopulmonary disease. Electronically Signed   By: Myles Rosenthal M.D.   On: 06/27/2016 18:41   Dg Abd 1 View  Result Date: 06/28/2016 CLINICAL DATA:  Intractable nausea, vomiting for 2 days EXAM: ABDOMEN - 1 VIEW COMPARISON:  CT 05/24/2016 FINDINGS: Nonobstructive bowel gas pattern. Moderate stool burden throughout the colon. No organomegaly or free air. No suspicious calcification. Degenerative changes in the hips and lower lumbar spine. IMPRESSION: Moderate stool burden in the colon. No evidence of obstruction or free air. Electronically Signed   By: Charlett Nose M.D.   On: 06/28/2016 10:41   Ct Head Wo Contrast  Result Date: 06/28/2016 CLINICAL DATA:  Patient fell on concrete hitting top of head. EXAM: CT HEAD WITHOUT CONTRAST  TECHNIQUE: Contiguous axial images were obtained from the base of the skull through the vertex without intravenous contrast. COMPARISON:  None. FINDINGS: BRAIN: There mild is sulcal and ventricular prominence consistent with superficial and central atrophy. No intraparenchymal hemorrhage, mass effect nor midline shift. Periventricular and subcortical white matter hypodensities consistent with chronic small vessel ischemic disease are identified. No acute large vascular territory infarcts. No abnormal extra-axial fluid collections. Basal cisterns are not effaced and midline. VASCULAR: Moderate calcific atherosclerosis of the carotid siphons and both vertebral arteries. SKULL: No skull fracture. No significant scalp soft tissue swelling. SINUSES/ORBITS: The mastoid air-cells are clear. The included paranasal sinuses are well-aerated within the left maxillary sinus with partially visualized small mucous retention cystThe included ocular globes and orbital contents are non-suspicious. OTHER: None. IMPRESSION: 1. Chronic appearing small vessel ischemic disease of periventricular white matter with cerebral mild atrophy. 2. No acute intracranial abnormality. Electronically Signed   By: Tollie Eth M.D.   On: 06/28/2016 01:36   Ct Head Wo Contrast  Result Date: 06/27/2016 CLINICAL DATA:  Tripped over a step, hit top of his head, patient takes  blood thinner EXAM: CT HEAD WITHOUT CONTRAST TECHNIQUE: Contiguous axial images were obtained from the base of the skull through the vertex without intravenous contrast. COMPARISON:  05/24/2016 FINDINGS: Brain: No intracranial hemorrhage, mass effect or midline shift. No acute cortical infarction. Stable atrophy in chronic small vessel ischemic changes bilateral periventricular and patchy subcortical white matter. No mass lesion is noted on this unenhanced scan. Ventricular size is stable from prior exam. No intraventricular hemorrhage. Vascular: Atherosclerotic calcifications of  carotid siphon again noted. Atherosclerotic calcifications bilateral vertebral arteries. Skull: There is no skull fracture. Sinuses/Orbits: Probable mucous retention cyst partially visualized left maxillary sinus measures 1.4 cm. Other: None IMPRESSION: No acute intracranial abnormality. Stable atrophy and chronic white matter small vessel ischemic changes. No definite acute cortical infarction. Electronically Signed   By: Natasha Mead M.D.   On: 06/27/2016 17:24   Ct Cervical Spine Wo Contrast  Result Date: 06/27/2016 CLINICAL DATA:  71 y/o M; status post fall with head injury. No neck complaints at this time. EXAM: CT CERVICAL SPINE WITHOUT CONTRAST TECHNIQUE: Multidetector CT imaging of the cervical spine was performed without intravenous contrast. Multiplanar CT image reconstructions were also generated. COMPARISON:  None. FINDINGS: Alignment: Straightening of cervical lordosis with mild reversal at the C5-6 level. No listhesis. Slight clockwise rotation of C1 on C2 is likely positional. Skull base and vertebrae: No acute fracture. No primary bone lesion or focal pathologic process. Soft tissues and spinal canal: No prevertebral fluid or swelling. No visible canal hematoma. Disc levels: Moderate cervical spondylosis with with predominantly discogenic degenerative changes at the C6 through T1 levels and left-greater-than-right facet degenerative changes. There are multiple levels of canal stenosis greatest at C6-7 where it is at least moderate. No high-grade bony foraminal narrowing. Extensive productive changes of the anterior C1-2 articulation. Upper chest: Negative. Other: Moderate calcific atherosclerosis of the carotid bifurcations bilaterally. Otherwise negative. IMPRESSION: 1. No acute fracture or dislocation is identified. 2. Moderate cervical spondylosis greatest at C6-7 where there is at least moderate canal stenosis. Electronically Signed   By: Mitzi Hansen M.D.   On: 06/27/2016 18:41    Mr Brain Wo Contrast  Result Date: 06/28/2016 CLINICAL DATA:  Ataxia and vomiting EXAM: MRI HEAD WITHOUT CONTRAST TECHNIQUE: Multiplanar, multiecho pulse sequences of the brain and surrounding structures were obtained without intravenous contrast. COMPARISON:  Head CT 06/28/2016 Brain MRI 02/16/2013 FINDINGS: Brain: There is a punctate focus of diffusion restriction within the posterior left frontal white matter, near the base of the left central sulcus. There is beginning confluent hyperintense T2-weighted signal within the periventricular white matter, most often seen in the setting of chronic microvascular ischemia. Old lacunar infarct in the superior left frontal white matter is unchanged. No mass lesion or midline shift. No hydrocephalus or extra-axial fluid collection. The midline structures are normal. No age advanced or lobar predominant atrophy. Vascular: Major intracranial arterial and venous sinus flow voids are preserved. No evidence of chronic microhemorrhage or amyloid angiopathy. Skull and upper cervical spine: The visualized skull base, calvarium, upper cervical spine and extracranial soft tissues are normal. Sinuses/Orbits: No fluid levels or advanced mucosal thickening. No mastoid effusion. Normal orbits. IMPRESSION: 1. Punctate focus of acute ischemia within the posterior left frontal white matter without hemorrhage or mass effect. 2. Chronic microvascular ischemia and old lacunar infarcts, unchanged compared to the 02/16/2013. Electronically Signed   By: Deatra Robinson M.D.   On: 06/28/2016 06:02   US Renal  Result Date: 06/28/2016 CLINICAL DATA:  Acute kidney injury  EXAM: RENAL / URINARY TRACT ULTRASOUND COMPLETE COMPARISON:  05/24/2016 CT abdomen/ pelvis. FINDINGS: Right Kidney: Length: 13.9 cm. Mildly echogenic right renal parenchyma, which is normal in thickness. No right hydronephrosis. Simple appearing exophytic 1.5 x 0.9 x 1.0 cm renal cyst in the medial upper right kidney. No  additional right renal lesions. Left Kidney: Length: 13.7 cm. Mildly echogenic left renal parenchyma, which is normal in thickness. No left hydronephrosis. No left renal mass . Bladder: Appears normal for degree of bladder distention. IMPRESSION: 1. No hydronephrosis. 2. Mildly echogenic normal size kidneys, compatible with the provided history of nonspecific acute renal parenchymal disease. 3. Small simple right renal cyst . 4. Normal bladder. Electronically Signed   By: Delbert Phenix M.D.   On: 06/28/2016 10:43   Mr Maxine Glenn Head/brain ZO Cm  Result Date: 06/28/2016 CLINICAL DATA:  Infarct on brain MRI EXAM: MRA HEAD WITHOUT CONTRAST TECHNIQUE: Angiographic images of the Circle of Willis were obtained using MRA technique without intravenous contrast. COMPARISON:  Brain MRI from earlier today FINDINGS: Symmetric enhancement of the carotid and vertebral arteries at the skullbase. Mild asymmetry of carotid size, larger on the left, attributed to larger A1 on the left.Mild left vertebral artery dominance. Standard vertebrobasilar branching. There is no major branch occlusion or flow limiting stenosis. Atherosclerotic irregularity bilateral carotid siphons with mild bilateral supraclinoid ICA narrowing, borderline moderate on the right. Posterior circulation vessels are smooth. Tiny inferior outpouchings from the bilateral supraclinoid ICA is attributed to infundibula based on source images. No generalized vessel beading. IMPRESSION: 1. No acute finding. 2. Intracranial atherosclerotic changes with mild bilateral supraclinoid ICA narrowing. Electronically Signed   By: Marnee Spring M.D.   On: 06/28/2016 13:21     CBC  Recent Labs Lab 06/27/16 1807 06/28/16 0052 06/28/16 0718 06/29/16 0308  WBC 9.8 7.6 6.3 6.9  HGB 13.7 12.2* 12.1* 11.3*  HCT 43.4 37.9* 38.1* 35.0*  PLT 136* 124* 121* 101*  MCV 92.7 92.9 92.9 92.3  MCH 29.3 29.9 29.5 29.8  MCHC 31.6 32.2 31.8 32.3  RDW 15.7* 16.0* 16.1* 16.2*   LYMPHSABS 0.7 0.7 0.8  --   MONOABS 0.6 0.4 0.3  --   EOSABS 0.1 0.1 0.3  --   BASOSABS 0.0 0.0 0.0  --     Chemistries   Recent Labs Lab 06/27/16 1807 06/28/16 0052 06/28/16 0821 06/29/16 0308  NA 138 137 137 136  K 4.5 4.5 4.0 3.8  CL 102 104 104 107  CO2 GLUCOSE 177* 199* 165* 181*  BUN 30* 34* 33* 30*  CREATININE 1.77* 1.94* 1.61* 1.19  CALCIUM 8.7* 8.1* 8.0* 7.9*  AST ALT ALKPHOS 102 85 81 82  BILITOT 1.1 0.8 1.0 0.6   ------------------------------------------------------------------------------------------------------------------ estimated creatinine clearance is 70.8 mL/min (by C-G formula based on SCr of 1.19 mg/dL). ------------------------------------------------------------------------------------------------------------------  Recent Labs  06/28/16 0718  HGBA1C 7.6*   ------------------------------------------------------------------------------------------------------------------  Recent Labs  06/28/16 0718  CHOL 96  HDL 28*  LDLCALC 50  TRIG 88  CHOLHDL 3.4   ------------------------------------------------------------------------------------------------------------------  Recent Labs  06/28/16 1121  TSH 0.983   ------------------------------------------------------------------------------------------------------------------  Recent Labs  06/28/16 0718 06/28/16 0821 06/28/16 1121  VITAMINB12  --  214 230  FOLATE  --  8.7 7.9  FERRITIN  --  39 47  TIBC  --  277 281  IRON  --  34* 27*  RETICCTPCT 1.3  --  1.2  Coagulation profile No results for input(s): INR, PROTIME in the last 168 hours.  No results for input(s): DDIMER in the last 72 hours.  Cardiac Enzymes  Recent Labs Lab 06/28/16 0308 06/28/16 0821  TROPONINI <0.03 <0.03   ------------------------------------------------------------------------------------------------------------------ Invalid input(s):  POCBNP   CBG:  Recent Labs Lab 06/28/16 0817 06/28/16 1115 06/28/16 1608 06/28/16 2105 06/29/16 0621  GLUCAP 182* 135* 193* 108* 157*       Studies: Dg Chest 2 View  Result Date: 06/27/2016 CLINICAL DATA:  Left-sided chest pain, cough, and chest congestion. Coronary artery disease. EXAM: CHEST  2 VIEW COMPARISON:  02/15/2016 FINDINGS: The heart size and mediastinal contours are within normal limits. Prior CABG. Both lungs are clear. The visualized skeletal structures are unremarkable. IMPRESSION: Stable exam.  No active cardiopulmonary disease. Electronically Signed   By: Myles Rosenthal M.D.   On: 06/27/2016 18:41   Dg Abd 1 View  Result Date: 06/28/2016 CLINICAL DATA:  Intractable nausea, vomiting for 2 days EXAM: ABDOMEN - 1 VIEW COMPARISON:  CT 05/24/2016 FINDINGS: Nonobstructive bowel gas pattern. Moderate stool burden throughout the colon. No organomegaly or free air. No suspicious calcification. Degenerative changes in the hips and lower lumbar spine. IMPRESSION: Moderate stool burden in the colon. No evidence of obstruction or free air. Electronically Signed   By: Charlett Nose M.D.   On: 06/28/2016 10:41   Ct Head Wo Contrast  Result Date: 06/28/2016 CLINICAL DATA:  Patient fell on concrete hitting top of head. EXAM: CT HEAD WITHOUT CONTRAST TECHNIQUE: Contiguous axial images were obtained from the base of the skull through the vertex without intravenous contrast. COMPARISON:  None. FINDINGS: BRAIN: There mild is sulcal and ventricular prominence consistent with superficial and central atrophy. No intraparenchymal hemorrhage, mass effect nor midline shift. Periventricular and subcortical white matter hypodensities consistent with chronic small vessel ischemic disease are identified. No acute large vascular territory infarcts. No abnormal extra-axial fluid collections. Basal cisterns are not effaced and midline. VASCULAR: Moderate calcific atherosclerosis of the carotid siphons and  both vertebral arteries. SKULL: No skull fracture. No significant scalp soft tissue swelling. SINUSES/ORBITS: The mastoid air-cells are clear. The included paranasal sinuses are well-aerated within the left maxillary sinus with partially visualized small mucous retention cystThe included ocular globes and orbital contents are non-suspicious. OTHER: None. IMPRESSION: 1. Chronic appearing small vessel ischemic disease of periventricular white matter with cerebral mild atrophy. 2. No acute intracranial abnormality. Electronically Signed   By: Tollie Eth M.D.   On: 06/28/2016 01:36   Ct Head Wo Contrast  Result Date: 06/27/2016 CLINICAL DATA:  Tripped over a step, hit top of his head, patient takes blood thinner EXAM: CT HEAD WITHOUT CONTRAST TECHNIQUE: Contiguous axial images were obtained from the base of the skull through the vertex without intravenous contrast. COMPARISON:  05/24/2016 FINDINGS: Brain: No intracranial hemorrhage, mass effect or midline shift. No acute cortical infarction. Stable atrophy in chronic small vessel ischemic changes bilateral periventricular and patchy subcortical white matter. No mass lesion is noted on this unenhanced scan. Ventricular size is stable from prior exam. No intraventricular hemorrhage. Vascular: Atherosclerotic calcifications of carotid siphon again noted. Atherosclerotic calcifications bilateral vertebral arteries. Skull: There is no skull fracture. Sinuses/Orbits: Probable mucous retention cyst partially visualized left maxillary sinus measures 1.4 cm. Other: None IMPRESSION: No acute intracranial abnormality. Stable atrophy and chronic white matter small vessel ischemic changes. No definite acute cortical infarction. Electronically Signed   By: Natasha Mead M.D.   On: 06/27/2016 17:24  Ct Cervical Spine Wo Contrast  Result Date: 06/27/2016 CLINICAL DATA:  71 y/o M; status post fall with head injury. No neck complaints at this time. EXAM: CT CERVICAL SPINE WITHOUT  CONTRAST TECHNIQUE: Multidetector CT imaging of the cervical spine was performed without intravenous contrast. Multiplanar CT image reconstructions were also generated. COMPARISON:  None. FINDINGS: Alignment: Straightening of cervical lordosis with mild reversal at the C5-6 level. No listhesis. Slight clockwise rotation of C1 on C2 is likely positional. Skull base and vertebrae: No acute fracture. No primary bone lesion or focal pathologic process. Soft tissues and spinal canal: No prevertebral fluid or swelling. No visible canal hematoma. Disc levels: Moderate cervical spondylosis with with predominantly discogenic degenerative changes at the C6 through T1 levels and left-greater-than-right facet degenerative changes. There are multiple levels of canal stenosis greatest at C6-7 where it is at least moderate. No high-grade bony foraminal narrowing. Extensive productive changes of the anterior C1-2 articulation. Upper chest: Negative. Other: Moderate calcific atherosclerosis of the carotid bifurcations bilaterally. Otherwise negative. IMPRESSION: 1. No acute fracture or dislocation is identified. 2. Moderate cervical spondylosis greatest at C6-7 where there is at least moderate canal stenosis. Electronically Signed   By: Mitzi Hansen M.D.   On: 06/27/2016 18:41   Mr Brain Wo Contrast  Result Date: 06/28/2016 CLINICAL DATA:  Ataxia and vomiting EXAM: MRI HEAD WITHOUT CONTRAST TECHNIQUE: Multiplanar, multiecho pulse sequences of the brain and surrounding structures were obtained without intravenous contrast. COMPARISON:  Head CT 06/28/2016 Brain MRI 02/16/2013 FINDINGS: Brain: There is a punctate focus of diffusion restriction within the posterior left frontal white matter, near the base of the left central sulcus. There is beginning confluent hyperintense T2-weighted signal within the periventricular white matter, most often seen in the setting of chronic microvascular ischemia. Old lacunar infarct in  the superior left frontal white matter is unchanged. No mass lesion or midline shift. No hydrocephalus or extra-axial fluid collection. The midline structures are normal. No age advanced or lobar predominant atrophy. Vascular: Major intracranial arterial and venous sinus flow voids are preserved. No evidence of chronic microhemorrhage or amyloid angiopathy. Skull and upper cervical spine: The visualized skull base, calvarium, upper cervical spine and extracranial soft tissues are normal. Sinuses/Orbits: No fluid levels or advanced mucosal thickening. No mastoid effusion. Normal orbits. IMPRESSION: 1. Punctate focus of acute ischemia within the posterior left frontal white matter without hemorrhage or mass effect. 2. Chronic microvascular ischemia and old lacunar infarcts, unchanged compared to the 02/16/2013. Electronically Signed   By: Deatra Robinson M.D.   On: 06/28/2016 06:02   US Renal  Result Date: 06/28/2016 CLINICAL DATA:  Acute kidney injury EXAM: RENAL / URINARY TRACT ULTRASOUND COMPLETE COMPARISON:  05/24/2016 CT abdomen/ pelvis. FINDINGS: Right Kidney: Length: 13.9 cm. Mildly echogenic right renal parenchyma, which is normal in thickness. No right hydronephrosis. Simple appearing exophytic 1.5 x 0.9 x 1.0 cm renal cyst in the medial upper right kidney. No additional right renal lesions. Left Kidney: Length: 13.7 cm. Mildly echogenic left renal parenchyma, which is normal in thickness. No left hydronephrosis. No left renal mass . Bladder: Appears normal for degree of bladder distention. IMPRESSION: 1. No hydronephrosis. 2. Mildly echogenic normal size kidneys, compatible with the provided history of nonspecific acute renal parenchymal disease. 3. Small simple right renal cyst . 4. Normal bladder. Electronically Signed   By: Delbert Phenix M.D.   On: 06/28/2016 10:43   Mr Maxine Glenn Head/brain OZ Cm  Result Date: 06/28/2016 CLINICAL DATA:  Infarct on  brain MRI EXAM: MRA HEAD WITHOUT CONTRAST TECHNIQUE:  Angiographic images of the Circle of Willis were obtained using MRA technique without intravenous contrast. COMPARISON:  Brain MRI from earlier today FINDINGS: Symmetric enhancement of the carotid and vertebral arteries at the skullbase. Mild asymmetry of carotid size, larger on the left, attributed to larger A1 on the left.Mild left vertebral artery dominance. Standard vertebrobasilar branching. There is no major branch occlusion or flow limiting stenosis. Atherosclerotic irregularity bilateral carotid siphons with mild bilateral supraclinoid ICA narrowing, borderline moderate on the right. Posterior circulation vessels are smooth. Tiny inferior outpouchings from the bilateral supraclinoid ICA is attributed to infundibula based on source images. No generalized vessel beading. IMPRESSION: 1. No acute finding. 2. Intracranial atherosclerotic changes with mild bilateral supraclinoid ICA narrowing. Electronically Signed   By: Marnee Spring M.D.   On: 06/28/2016 13:21      Lab Results  Component Value Date   HGBA1C 7.6 (H) 06/28/2016   HGBA1C 6.9 04/24/2016   HGBA1C 7.0 01/10/2016   Lab Results  Component Value Date   MICROALBUR 1.8 06/23/2014   LDLCALC 50 06/28/2016   CREATININE 1.19 06/29/2016       Scheduled Meds: .  stroke: mapping our early stages of recovery book   Does not apply Once  . acetaminophen  1,000 mg Oral Once  . apixaban  5 mg Oral BID  . aspirin EC  81 mg Oral Daily  . atorvastatin  80 mg Oral Daily  . buPROPion  150 mg Oral BID  . citalopram  20 mg Oral Daily  . insulin aspart  0-9 Units Subcutaneous TID WC  . insulin aspart  10 Units Subcutaneous TID WC  . insulin glargine  20 Units Subcutaneous QHS  . isosorbide mononitrate  30 mg Oral Daily  . nebivolol  20 mg Oral Daily  . pantoprazole  40 mg Oral Daily   Continuous Infusions:   LOS: 1 day    Time spent: >30 MINS    Richarda Overlie  [facility] Pager [phone]. If 7PM-7AM, please contact  night-coverage at [email or web address], password [facility] 06/29/2016, 11:42 AM  LOS: 1 day

## 2016-06-29 NOTE — Evaluation (Signed)
Physical Therapy Evaluation Patient Details Name: Luis Yu MRN: [id number] DOB: Apr 03, 1945 Today's Date: 06/29/2016   History of Present Illness  Pt is a 71 y.o. male presenting after fall and persistent nausea and vomiting. MRI on 4/12 + for acute ischemia involving the L frontal lobe. PMHx: Atrial flutter, CAD, Depression, DM2, Diabetic peripheral neuropathy, GERD, Herpes zoster, Hyperlipidemia, HTN, Obesity, Osteoarthritis, Sleep apnea, Cardioversion x2 in 2017, CABG 1996, bil TKA.  Clinical Impression  Patient presented with decreased endurance, balance, and safety with ambulation. He is motivated to improve his balance and get home. He ambulated 75' with a walker but was fatigued after the first 15. He is a high fall risk at this time. He is a good candadate for inpatient rehab.     Follow Up Recommendations CIR    Equipment Recommendations  Rolling walker with 5" wheels    Recommendations for Other Services Rehab consult     Precautions / Restrictions Precautions Precautions: Fall Restrictions Weight Bearing Restrictions: No      Mobility  Bed Mobility Overal bed mobility: Needs Assistance Bed Mobility: Sit to Supine       Sit to supine: Min assist   General bed mobility comments: Min a to sit up at the edge of the bed. Once sitting aupervision only required   Transfers Overall transfer level: Needs assistance Equipment used: Rolling walker (2 wheeled) Transfers: Sit to/from Stand Sit to Stand: Min assist         General transfer comment: Min assist for balance in standing.  Ambulation/Gait Ambulation/Gait assistance: Min guard Ambulation Distance (Feet): 30 Feet   Gait Pattern/deviations: Shuffle   Gait velocity interpretation: Below normal speed for age/gender General Gait Details: Patient drifted to the right with gait. He required cuing to stay in the walker. He was fatigued with minimal ambulation.    Stairs            Wheelchair  Mobility    Modified Rankin (Stroke Patients Only) Modified Rankin (Stroke Patients Only) Pre-Morbid Rankin Score: Slight disability Modified Rankin: Moderately severe disability     Balance Overall balance assessment: Needs assistance;History of Falls Sitting-balance support: Feet supported;No upper extremity supported Sitting balance-Leahy Scale: Poor Sitting balance - Comments: Min guard-max assist needed to maintain sitting balance. Seems to have very poor balance with strong posterior lean when participating in functional activities. R lean noted at times too. Postural control: Posterior lean;Right lateral lean Standing balance support: Bilateral upper extremity supported Standing balance-Leahy Scale: Poor                               Pertinent Vitals/Pain Pain Assessment: No/denies pain    Home Living Family/patient expects to be discharged to:: Private residence Living Arrangements: Spouse/significant other Available Help at Discharge: Family;Available 24 hours/day Type of Home: House Home Access: Stairs to enter   [employer] of Steps: 3 Home Layout: One level Home Equipment: Walker - 4 wheels;Shower seat      Prior Function Level of Independence: Independent with assistive device(s)         Comments: rollator for all mobility. Per aptients wife Pulmonary MD reccomended that he stop PT 2nd to pulmonary reasons. Since that point his wife reports he has become more sednetary. He mostly transfers from his bed to his chair.      Hand Dominance        Extremity/Trunk Assessment   Upper Extremity Assessment Upper Extremity Assessment:  Defer to OT evaluation    Lower Extremity Assessment Lower Extremity Assessment: Generalized weakness    Cervical / Trunk Assessment Cervical / Trunk Assessment: Kyphotic  Communication   Communication: Expressive difficulties  Cognition Arousal/Alertness: Awake/alert Behavior During Therapy:  Impulsive                                   General Comments: Spoke with patients wife. At baseline patient was able to follow all commands. He was sedentary       General Comments General comments (skin integrity, edema, etc.): General balance: min a when reaching out of base of support to maintain balance, Min Guard required when weight shifting side to side.     Exercises     Assessment/Plan    PT Assessment Patient needs continued PT services  PT Problem List Decreased strength;Decreased range of motion;Decreased activity tolerance;Decreased balance;Decreased mobility;Decreased coordination;Decreased safety awareness       PT Treatment Interventions DME instruction;Gait training;Stair training;Functional mobility training;Therapeutic activities;Therapeutic exercise;Balance training;Neuromuscular re-education    PT Goals (Current goals can be found in the Care Plan section)  Acute Rehab PT Goals Patient Stated Goal: to go to rehab  PT Goal Formulation: With patient Time For Goal Achievement: 07/06/16 Potential to Achieve Goals: Good    Frequency Min 5X/week   Barriers to discharge        Co-evaluation               End of Session Equipment Utilized During Treatment: Gait belt Activity Tolerance: Patient tolerated treatment well Patient left: in chair;with call bell/phone within reach;with chair alarm set Nurse Communication: Mobility status PT Visit Diagnosis: Repeated falls (R29.6);Difficulty in walking, not elsewhere classified (R26.2)    Time: 1610-9604 PT Time Calculation (min) (ACUTE ONLY): 26 min   Charges:   PT Evaluation $PT Eval Moderate Complexity: 1 Procedure     PT G Codes:         Dessie Coma PT DPT  06/29/2016, 2:59 PM

## 2016-06-29 NOTE — Progress Notes (Signed)
I met with pt at bedside and then contacted his wife by phone to discuss a possible inpt rehab admission. Both prefer inpt rehab rather than SNF. He has longstanding gait issues for 6 months. Frequent falls. We will follow up on Monday. I await PT evaluation. [phone]

## 2016-06-29 NOTE — Progress Notes (Signed)
Placed pt. On auto bipap. Pt. Tolerating well at this time.

## 2016-06-29 NOTE — Consult Note (Signed)
Physical Medicine and Rehabilitation Consult Reason for Consult: Nausea vomiting with ataxic gait. Referring Physician: [facility]   HPI: Luis Yu is a 71 y.o. right handed male with history of bilateral TKA, CAD status post CABG, chronic atrial fibrillation maintained on aspirin and Eliquis, hypertension, diabetes mellitus. Per chart review patient lives with spouse. One level home 3 steps to entry. He was independent with a walker prior to admission. Presented 06/28/2016 after being referred to the ER by primary care physician after a fall while at the office with persistent nausea and vomiting. In the ER CT of the head and neck was unremarkable was planned discharge to home. While waiting outside for his wife to pick him up patient with ongoing nausea and vomiting a repeat CT of the head was again negative. Blood pressure 130/110. MRI of the brain showed punctate focus of acute ischemia within the posterior left frontal white matter without hemorrhage or mass effect. Chronic microvascular ischemia and old lacunar infarcts. MRA was unremarkable. Carotid Dopplers with mild to moderate heterogeneous plaque in bilateral carotid artery suggestive of less than 50% stenosis. Echocardiogram pending. Neurology follow-up patient remains on aspirin and Eliquis as prior to admission. Tolerating a regular consistency diet.   Review of Systems  Constitutional: Negative for chills and fever.  HENT: Negative for hearing loss.   Eyes: Negative for blurred vision and double vision.  Respiratory: Negative for cough and shortness of breath.   Cardiovascular: Positive for palpitations. Negative for chest pain and leg swelling.  Gastrointestinal: Positive for constipation. Negative for nausea and vomiting.       GERD  Genitourinary: Positive for urgency. Negative for dysuria and hematuria.  Musculoskeletal: Positive for falls, joint pain and myalgias.  Skin: Negative for rash.  Neurological: Negative for  seizures.  Psychiatric/Behavioral: Positive for depression.  All other systems reviewed and are negative.  Past Medical History:  Diagnosis Date  . Adjustment disorder with mixed emotional features   . Atrial flutter (HCC)   . CAD (coronary artery disease)   . Depression   . Diabetes mellitus type II, uncontrolled (HCC)   . Diabetic peripheral neuropathy (HCC)   . Erectile dysfunction   . GERD (gastroesophageal reflux disease)   . Herpes zoster   . Hyperlipidemia   . Hypertension   . Obesity   . Osteoarthritis   . Postherpetic neuralgia   . Sleep apnea    Past Surgical History:  Procedure Laterality Date  . CARDIOVERSION N/A 11/10/2015   Procedure: CARDIOVERSION;  Surgeon: Laurey Morale, MD;  Location: [facility] ENDOSCOPY;  Service: Cardiovascular;  Laterality: N/A;  . CARDIOVERSION N/A 02/21/2016   Procedure: CARDIOVERSION;  Surgeon: Lewayne Bunting, MD;  Location: [facility] ENDOSCOPY;  Service: Cardiovascular;  Laterality: N/A;  . CORONARY ARTERY BYPASS GRAFT  1996   eith a LIMA to the LAD, saphenous vein graft to the acute marginal, saphenous vein graft to the PDA and saphenous vein graft to the circumflex.  [name] KNEE ARTHROSCOPY  05/2004   right knee  . REPLACEMENT TOTAL KNEE BILATERAL    . SHOULDER SURGERY     Family History  Problem Relation Age of Onset  . Coronary artery disease Mother     deceased at 68  . Coronary artery disease Father     died age 51  . Stroke      half brother  . Stroke Brother    Social History:  reports that he has never smoked. He has never used  smokeless tobacco. He reports that he drinks about 1.2 oz of alcohol per week . He reports that he does not use drugs. Allergies: No Known Allergies Facility-Administered Medications Prior to Admission  Medication Dose Route Frequency Provider Last Rate Last Dose  . pneumococcal 13-valent conjugate vaccine (PREVNAR 13) injection 0.5 mL  0.5 mL Intramuscular Tomorrow-1000 Shirline Frees, NP       Medications  Prior to Admission  Medication Sig Dispense Refill  . acetaminophen (TYLENOL) 500 MG tablet Take 2 tablets (1,000 mg total) by mouth every 6 (six) hours as needed. (Patient taking differently: Take 1,000 mg by mouth every 6 (six) hours as needed for mild pain. ) 30 tablet 0  . apixaban (ELIQUIS) 5 MG TABS tablet Take 1 tablet (5 mg total) by mouth 2 (two) times daily. 60 tablet 11  . aspirin EC 81 MG tablet Take 1 tablet (81 mg total) by mouth daily.    [name] atorvastatin (LIPITOR) 80 MG tablet TAKE 1 TABLET (80 MG TOTAL) BY MOUTH DAILY. 90 tablet 3  . buPROPion (WELLBUTRIN SR) 150 MG 12 hr tablet Take 1 tablet (150 mg total) by mouth 2 (two) times daily. 180 tablet 1  . BYSTOLIC 20 MG TABS TAKE 1 TABLET (20 MG TOTAL) BY MOUTH DAILY. 90 tablet 3  . citalopram (CELEXA) 20 MG tablet Take 1 tablet (20 mg total) by mouth daily. 90 tablet 1  . glipiZIDE (GLUCOTROL) 10 MG tablet Take 1 tablet (10 mg total) by mouth 2 (two) times daily before a meal. (Patient taking differently: Take 10 mg by mouth daily before breakfast. ) 180 tablet 3  . glucose blood (ACCU-CHEK ACTIVE STRIPS) test strip Use as instructed 100 each 12  . insulin aspart (NOVOLOG FLEXPEN) 100 UNIT/ML FlexPen USE 15 TO 30 UNITS 15 MINUTES BEFORE MEALS 3 TIMES DAILY 15 pen 6  . Insulin Glargine (BASAGLAR KWIKPEN) 100 UNIT/ML SOPN Inject 0.2 mLs (20 Units total) into the skin at bedtime. 3 pen 11  . Insulin Pen Needle (PEN NEEDLES 31GX5/16") 31G X 8 MM MISC Use to inject insulin 5 times a day. 100 each 11  . isosorbide mononitrate (IMDUR) 30 MG 24 hr tablet Take 1 tablet (30 mg total) by mouth daily. 30 tablet 12  . Lancets (ACCU-CHEK MULTICLIX) lancets Use up to 5 times a day to check blood sugar. 100 each 5  . metFORMIN (GLUCOPHAGE) 1000 MG tablet Take 1 tablet (1,000 mg total) by mouth 2 (two) times daily with a meal. 180 tablet 3  . omeprazole (PRILOSEC) 20 MG capsule TAKE ONE CAPSULE TWICE A DAY BEFORE A MEAL 180 capsule 0  . ondansetron  (ZOFRAN) 4 MG tablet Take 1 tablet (4 mg total) by mouth every 6 (six) hours as needed for nausea or vomiting. 12 tablet 0  . valsartan-hydrochlorothiazide (DIOVAN-HCT) 80-12.5 MG tablet Take 1 tablet by mouth daily. 90 tablet 3    Home: Home Living Family/patient expects to be discharged to:: Private residence Living Arrangements: Spouse/significant other Available Help at Discharge: Family, Available 24 hours/day Type of Home: House Home Access: Stairs to enter [employer] of Steps: 3 Home Layout: One level Bathroom Shower/Tub: Health visitor: Handicapped height Home Equipment: Environmental consultant - 4 wheels, Shower seat  Functional History: Prior Function Level of Independence: Independent with assistive device(s) Comments: rollator for all mobility. Independent with BADL; recently wife has been needing to assist minimally when pt fatigues Functional Status:  Mobility: Bed Mobility Overal bed mobility: Needs Assistance Bed Mobility: Sit  to Supine Sit to supine: Min assist General bed mobility comments: Min assist for controlled descent of trunk to supine. Pt moves quickly and is off balance with movement. Transfers Overall transfer level: Needs assistance Equipment used: Rolling walker (2 wheeled) Transfers: Sit to/from Stand Sit to Stand: Min assist General transfer comment: Min assist for balance in standing.      ADL: ADL Overall ADL's : Needs assistance/impaired Eating/Feeding: Set up, Sitting Grooming: Minimal assistance, Sitting Upper Body Bathing: Moderate assistance, Sitting Lower Body Bathing: Maximal assistance, Sit to/from stand Upper Body Dressing : Moderate assistance, Sitting Lower Body Dressing: Maximal assistance, Sit to/from stand Lower Body Dressing Details (indicate cue type and reason): When attempting to adjust socks sitting EOB pt with strong posterior lean/falling posteriorly. General ADL Comments: Pt tolerated standing x2 for ~1  minute each; then quickly sits back down reporting dizziness and fatigue. Pt with SOB with minimal activity; BP 130/110, HR 113.  Pt reports he noticies that he leans/falls backwards often; 5-6 falls in the past 6 months.  Cognition: Cognition Overall Cognitive Status: No family/caregiver present to determine baseline cognitive functioning Orientation Level: Oriented X4 Cognition Arousal/Alertness: Awake/alert Behavior During Therapy: Impulsive Overall Cognitive Status: No family/caregiver present to determine baseline cognitive functioning Area of Impairment: Following commands, Safety/judgement, Problem solving Following Commands: Follows one step commands consistently, Follows one step commands with increased time Safety/Judgement: Decreased awareness of safety Problem Solving: Requires verbal cues, Requires tactile cues  Blood pressure 117/82, pulse 64, temperature 98.3 F (36.8 C), temperature source Oral, resp. rate 18, height  (1.88 m), weight 99.8 kg (220 lb), SpO2 97 %. Physical Exam  Constitutional: He appears well-developed.  HENT:  Head: Normocephalic.  Eyes: EOM are normal.  Neck: Normal range of motion. Neck supple. No thyromegaly present.  Cardiovascular:  Cardiac rate control  Respiratory: Effort normal and breath sounds normal. No respiratory distress.  GI: Soft. Bowel sounds are normal. He exhibits no distension.  Neurological: He is alert.  Patient is a bit anxious. Made good eye contact with examiner. He was able to provide his name and age. Fair awareness of deficits. Follows simple commands. Halting speech. 4 limb ataxia with right side more affected than left with FTN,HTS. Strength grossly 4 to 4+/5 all 4 limbs. Senses pain and LT in all 4 limbs.   Skin: Skin is warm and dry.  Psychiatric: He has a normal mood and affect. His behavior is normal.    Results for orders placed or performed during the hospital encounter of 06/27/16 (from the past 24 hour(s))    CBC with Differential/Platelet     Status: Abnormal   Collection Time: 06/28/16  7:18 AM  Result Value Ref Range   WBC 6.3 4.0 - 10.5 K/uL   RBC 4.10 (L) 4.22 - 5.81 MIL/uL   Hemoglobin 12.1 (L) 13.0 - 17.0 g/dL   HCT 16.1 (L) 09.6 - 04.5 %   MCV 92.9 78.0 - 100.0 fL   MCH 29.5 26.0 - 34.0 pg   MCHC 31.8 30.0 - 36.0 g/dL   RDW 40.9 (H) 81.1 - 91.4 %   Platelets 121 (L) 150 - 400 K/uL   Neutrophils Relative % 77 %   Neutro Abs 4.8 1.7 - 7.7 K/uL   Lymphocytes Relative 13 %   Lymphs Abs 0.8 0.7 - 4.0 K/uL   Monocytes Relative 5 %   Monocytes Absolute 0.3 0.1 - 1.0 K/uL   Eosinophils Relative 5 %   Eosinophils Absolute 0.3 0.0 -  0.7 K/uL   Basophils Relative 0 %   Basophils Absolute 0.0 0.0 - 0.1 K/uL  Hemoglobin A1c     Status: Abnormal   Collection Time: 06/28/16  7:18 AM  Result Value Ref Range   Hgb A1c MFr Bld 7.6 (H) 4.8 - 5.6 %   Mean Plasma Glucose 171 mg/dL  Lipid panel     Status: Abnormal   Collection Time: 06/28/16  7:18 AM  Result Value Ref Range   Cholesterol 96 0 - 200 mg/dL   Triglycerides 88 <161 mg/dL   HDL 28 (L) >09 mg/dL   Total CHOL/HDL Ratio 3.4 RATIO   VLDL 18 0 - 40 mg/dL   LDL Cholesterol 50 0 - 99 mg/dL  Reticulocytes     Status: Abnormal   Collection Time: 06/28/16  7:18 AM  Result Value Ref Range   Retic Ct Pct 1.3 0.4 - 3.1 %   RBC. 4.10 (L) 4.22 - 5.81 MIL/uL   Retic Count, Manual 53.3 19.0 - 186.0 K/uL  Glucose, capillary     Status: Abnormal   Collection Time: 06/28/16  8:17 AM  Result Value Ref Range   Glucose-Capillary 182 (H) 65 - 99 mg/dL   Comment 1 Notify RN    Comment 2 Document in Chart   Vitamin B12     Status: None   Collection Time: 06/28/16  8:21 AM  Result Value Ref Range   Vitamin B-12 214 180 - 914 pg/mL  Iron and TIBC     Status: Abnormal   Collection Time: 06/28/16  8:21 AM  Result Value Ref Range   Iron 34 (L) 45 - 182 ug/dL   TIBC 604 540 - 981 ug/dL   Saturation Ratios 12 (L) 17.9 - 39.5 %   UIBC 243 ug/dL   Ferritin     Status: None   Collection Time: 06/28/16  8:21 AM  Result Value Ref Range   Ferritin 39 24 - 336 ng/mL  Folate     Status: None   Collection Time: 06/28/16  8:21 AM  Result Value Ref Range   Folate 8.7 >5.9 ng/mL  Lactate dehydrogenase     Status: Abnormal   Collection Time: 06/28/16  8:21 AM  Result Value Ref Range   LDH 210 (H) 98 - 192 U/L  Troponin I     Status: None   Collection Time: 06/28/16  8:21 AM  Result Value Ref Range   Troponin I <0.03 <0.03 ng/mL  TSH     Status: None   Collection Time: 06/28/16  8:21 AM  Result Value Ref Range   TSH 1.300 0.350 - 4.500 uIU/mL  Comprehensive metabolic panel     Status: Abnormal   Collection Time: 06/28/16  8:21 AM  Result Value Ref Range   Sodium 137 135 - 145 mmol/L   Potassium 4.0 3.5 - 5.1 mmol/L   Chloride 104 101 - 111 mmol/L   CO2 22 22 - 32 mmol/L   Glucose, Bld 165 (H) 65 - 99 mg/dL   BUN 33 (H) 6 - 20 mg/dL   Creatinine, Ser 1.91 (H) 0.61 - 1.24 mg/dL   Calcium 8.0 (L) 8.9 - 10.3 mg/dL   Total Protein 5.8 (L) 6.5 - 8.1 g/dL   Albumin 3.4 (L) 3.5 - 5.0 g/dL   AST 24 15 - 41 U/L   ALT 17 17 - 63 U/L   Alkaline Phosphatase 81 38 - 126 U/L   Total Bilirubin 1.0 0.3 - 1.2 mg/dL  GFR calc non Af Amer 41 (L) >60 mL/min   GFR calc Af Amer 48 (L) >60 mL/min   Anion gap 11 5 - 15  Glucose, capillary     Status: Abnormal   Collection Time: 06/28/16 11:15 AM  Result Value Ref Range   Glucose-Capillary 135 (H) 65 - 99 mg/dL  TSH     Status: None   Collection Time: 06/28/16 11:21 AM  Result Value Ref Range   TSH 0.983 0.350 - 4.500 uIU/mL  T4, free     Status: Abnormal   Collection Time: 06/28/16 11:21 AM  Result Value Ref Range   Free T4 1.21 (H) 0.61 - 1.12 ng/dL  Vitamin Z61     Status: None   Collection Time: 06/28/16 11:21 AM  Result Value Ref Range   Vitamin B-12 230 180 - 914 pg/mL  Folate     Status: None   Collection Time: 06/28/16 11:21 AM  Result Value Ref Range   Folate 7.9 >5.9 ng/mL   Iron and TIBC     Status: Abnormal   Collection Time: 06/28/16 11:21 AM  Result Value Ref Range   Iron 27 (L) 45 - 182 ug/dL   TIBC 096 045 - 409 ug/dL   Saturation Ratios 10 (L) 17.9 - 39.5 %   UIBC 254 ug/dL  Ferritin     Status: None   Collection Time: 06/28/16 11:21 AM  Result Value Ref Range   Ferritin 47 24 - 336 ng/mL  Reticulocytes     Status: Abnormal   Collection Time: 06/28/16 11:21 AM  Result Value Ref Range   Retic Ct Pct 1.2 0.4 - 3.1 %   RBC. 4.08 (L) 4.22 - 5.81 MIL/uL   Retic Count, Manual 49.0 19.0 - 186.0 K/uL  CK     Status: None   Collection Time: 06/28/16 11:21 AM  Result Value Ref Range   Total CK 388 49 - 397 U/L  Glucose, capillary     Status: Abnormal   Collection Time: 06/28/16  4:08 PM  Result Value Ref Range   Glucose-Capillary 193 (H) 65 - 99 mg/dL   Comment 1 Notify RN    Comment 2 Document in Chart   Glucose, capillary     Status: Abnormal   Collection Time: 06/28/16  9:05 PM  Result Value Ref Range   Glucose-Capillary 108 (H) 65 - 99 mg/dL  CBC     Status: Abnormal   Collection Time: 06/29/16  3:08 AM  Result Value Ref Range   WBC 6.9 4.0 - 10.5 K/uL   RBC 3.79 (L) 4.22 - 5.81 MIL/uL   Hemoglobin 11.3 (L) 13.0 - 17.0 g/dL   HCT 81.1 (L) 91.4 - 78.2 %   MCV 92.3 78.0 - 100.0 fL   MCH 29.8 26.0 - 34.0 pg   MCHC 32.3 30.0 - 36.0 g/dL   RDW 95.6 (H) 21.3 - 08.6 %   Platelets 101 (L) 150 - 400 K/uL  Comprehensive metabolic panel     Status: Abnormal   Collection Time: 06/29/16  3:08 AM  Result Value Ref Range   Sodium 136 135 - 145 mmol/L   Potassium 3.8 3.5 - 5.1 mmol/L   Chloride 107 101 - 111 mmol/L   CO2 23 22 - 32 mmol/L   Glucose, Bld 181 (H) 65 - 99 mg/dL   BUN 30 (H) 6 - 20 mg/dL   Creatinine, Ser 5.78 0.61 - 1.24 mg/dL   Calcium 7.9 (L) 8.9 - 10.3 mg/dL  Total Protein 5.2 (L) 6.5 - 8.1 g/dL   Albumin 3.1 (L) 3.5 - 5.0 g/dL   AST 28 15 - 41 U/L   ALT 19 17 - 63 U/L   Alkaline Phosphatase 82 38 - 126 U/L   Total  Bilirubin 0.6 0.3 - 1.2 mg/dL   GFR calc non Af Amer 59 (L) >60 mL/min   GFR calc Af Amer >60 >60 mL/min   Anion gap 6 5 - 15  Ammonia     Status: None   Collection Time: 06/29/16  3:08 AM  Result Value Ref Range   Ammonia 31 9 - 35 umol/L   Dg Chest 2 View  Result Date: 06/27/2016 CLINICAL DATA:  Left-sided chest pain, cough, and chest congestion. Coronary artery disease. EXAM: CHEST  2 VIEW COMPARISON:  02/15/2016 FINDINGS: The heart size and mediastinal contours are within normal limits. Prior CABG. Both lungs are clear. The visualized skeletal structures are unremarkable. IMPRESSION: Stable exam.  No active cardiopulmonary disease. Electronically Signed   By: Myles Rosenthal M.D.   On: 06/27/2016 18:41   Dg Abd 1 View  Result Date: 06/28/2016 CLINICAL DATA:  Intractable nausea, vomiting for 2 days EXAM: ABDOMEN - 1 VIEW COMPARISON:  CT 05/24/2016 FINDINGS: Nonobstructive bowel gas pattern. Moderate stool burden throughout the colon. No organomegaly or free air. No suspicious calcification. Degenerative changes in the hips and lower lumbar spine. IMPRESSION: Moderate stool burden in the colon. No evidence of obstruction or free air. Electronically Signed   By: Charlett Nose M.D.   On: 06/28/2016 10:41   Ct Head Wo Contrast  Result Date: 06/28/2016 CLINICAL DATA:  Patient fell on concrete hitting top of head. EXAM: CT HEAD WITHOUT CONTRAST TECHNIQUE: Contiguous axial images were obtained from the base of the skull through the vertex without intravenous contrast. COMPARISON:  None. FINDINGS: BRAIN: There mild is sulcal and ventricular prominence consistent with superficial and central atrophy. No intraparenchymal hemorrhage, mass effect nor midline shift. Periventricular and subcortical white matter hypodensities consistent with chronic small vessel ischemic disease are identified. No acute large vascular territory infarcts. No abnormal extra-axial fluid collections. Basal cisterns are not effaced and  midline. VASCULAR: Moderate calcific atherosclerosis of the carotid siphons and both vertebral arteries. SKULL: No skull fracture. No significant scalp soft tissue swelling. SINUSES/ORBITS: The mastoid air-cells are clear. The included paranasal sinuses are well-aerated within the left maxillary sinus with partially visualized small mucous retention cystThe included ocular globes and orbital contents are non-suspicious. OTHER: None. IMPRESSION: 1. Chronic appearing small vessel ischemic disease of periventricular white matter with cerebral mild atrophy. 2. No acute intracranial abnormality. Electronically Signed   By: Tollie Eth M.D.   On: 06/28/2016 01:36   Ct Head Wo Contrast  Result Date: 06/27/2016 CLINICAL DATA:  Tripped over a step, hit top of his head, patient takes blood thinner EXAM: CT HEAD WITHOUT CONTRAST TECHNIQUE: Contiguous axial images were obtained from the base of the skull through the vertex without intravenous contrast. COMPARISON:  05/24/2016 FINDINGS: Brain: No intracranial hemorrhage, mass effect or midline shift. No acute cortical infarction. Stable atrophy in chronic small vessel ischemic changes bilateral periventricular and patchy subcortical white matter. No mass lesion is noted on this unenhanced scan. Ventricular size is stable from prior exam. No intraventricular hemorrhage. Vascular: Atherosclerotic calcifications of carotid siphon again noted. Atherosclerotic calcifications bilateral vertebral arteries. Skull: There is no skull fracture. Sinuses/Orbits: Probable mucous retention cyst partially visualized left maxillary sinus measures 1.4 cm. Other: None IMPRESSION: No  acute intracranial abnormality. Stable atrophy and chronic white matter small vessel ischemic changes. No definite acute cortical infarction. Electronically Signed   By: Natasha Mead M.D.   On: 06/27/2016 17:24   Ct Cervical Spine Wo Contrast  Result Date: 06/27/2016 CLINICAL DATA:  71 y/o M; status post fall  with head injury. No neck complaints at this time. EXAM: CT CERVICAL SPINE WITHOUT CONTRAST TECHNIQUE: Multidetector CT imaging of the cervical spine was performed without intravenous contrast. Multiplanar CT image reconstructions were also generated. COMPARISON:  None. FINDINGS: Alignment: Straightening of cervical lordosis with mild reversal at the C5-6 level. No listhesis. Slight clockwise rotation of C1 on C2 is likely positional. Skull base and vertebrae: No acute fracture. No primary bone lesion or focal pathologic process. Soft tissues and spinal canal: No prevertebral fluid or swelling. No visible canal hematoma. Disc levels: Moderate cervical spondylosis with with predominantly discogenic degenerative changes at the C6 through T1 levels and left-greater-than-right facet degenerative changes. There are multiple levels of canal stenosis greatest at C6-7 where it is at least moderate. No high-grade bony foraminal narrowing. Extensive productive changes of the anterior C1-2 articulation. Upper chest: Negative. Other: Moderate calcific atherosclerosis of the carotid bifurcations bilaterally. Otherwise negative. IMPRESSION: 1. No acute fracture or dislocation is identified. 2. Moderate cervical spondylosis greatest at C6-7 where there is at least moderate canal stenosis. Electronically Signed   By: Mitzi Hansen M.D.   On: 06/27/2016 18:41   Mr Brain Wo Contrast  Result Date: 06/28/2016 CLINICAL DATA:  Ataxia and vomiting EXAM: MRI HEAD WITHOUT CONTRAST TECHNIQUE: Multiplanar, multiecho pulse sequences of the brain and surrounding structures were obtained without intravenous contrast. COMPARISON:  Head CT 06/28/2016 Brain MRI 02/16/2013 FINDINGS: Brain: There is a punctate focus of diffusion restriction within the posterior left frontal white matter, near the base of the left central sulcus. There is beginning confluent hyperintense T2-weighted signal within the periventricular white matter, most  often seen in the setting of chronic microvascular ischemia. Old lacunar infarct in the superior left frontal white matter is unchanged. No mass lesion or midline shift. No hydrocephalus or extra-axial fluid collection. The midline structures are normal. No age advanced or lobar predominant atrophy. Vascular: Major intracranial arterial and venous sinus flow voids are preserved. No evidence of chronic microhemorrhage or amyloid angiopathy. Skull and upper cervical spine: The visualized skull base, calvarium, upper cervical spine and extracranial soft tissues are normal. Sinuses/Orbits: No fluid levels or advanced mucosal thickening. No mastoid effusion. Normal orbits. IMPRESSION: 1. Punctate focus of acute ischemia within the posterior left frontal white matter without hemorrhage or mass effect. 2. Chronic microvascular ischemia and old lacunar infarcts, unchanged compared to the 02/16/2013. Electronically Signed   By: Deatra Robinson M.D.   On: 06/28/2016 06:02   US Renal  Result Date: 06/28/2016 CLINICAL DATA:  Acute kidney injury EXAM: RENAL / URINARY TRACT ULTRASOUND COMPLETE COMPARISON:  05/24/2016 CT abdomen/ pelvis. FINDINGS: Right Kidney: Length: 13.9 cm. Mildly echogenic right renal parenchyma, which is normal in thickness. No right hydronephrosis. Simple appearing exophytic 1.5 x 0.9 x 1.0 cm renal cyst in the medial upper right kidney. No additional right renal lesions. Left Kidney: Length: 13.7 cm. Mildly echogenic left renal parenchyma, which is normal in thickness. No left hydronephrosis. No left renal mass . Bladder: Appears normal for degree of bladder distention. IMPRESSION: 1. No hydronephrosis. 2. Mildly echogenic normal size kidneys, compatible with the provided history of nonspecific acute renal parenchymal disease. 3. Small simple right renal cyst .  4. Normal bladder. Electronically Signed   By: Delbert Phenix M.D.   On: 06/28/2016 10:43   Mr Maxine Glenn Head/brain ZO Cm  Result Date:  06/28/2016 CLINICAL DATA:  Infarct on brain MRI EXAM: MRA HEAD WITHOUT CONTRAST TECHNIQUE: Angiographic images of the Circle of Willis were obtained using MRA technique without intravenous contrast. COMPARISON:  Brain MRI from earlier today FINDINGS: Symmetric enhancement of the carotid and vertebral arteries at the skullbase. Mild asymmetry of carotid size, larger on the left, attributed to larger A1 on the left.Mild left vertebral artery dominance. Standard vertebrobasilar branching. There is no major branch occlusion or flow limiting stenosis. Atherosclerotic irregularity bilateral carotid siphons with mild bilateral supraclinoid ICA narrowing, borderline moderate on the right. Posterior circulation vessels are smooth. Tiny inferior outpouchings from the bilateral supraclinoid ICA is attributed to infundibula based on source images. No generalized vessel beading. IMPRESSION: 1. No acute finding. 2. Intracranial atherosclerotic changes with mild bilateral supraclinoid ICA narrowing. Electronically Signed   By: Marnee Spring M.D.   On: 06/28/2016 13:21    Assessment/Plan: Diagnosis: Left frontal infarct, pre-existing gait disorder with ataxia? 1. Does the need for close, 24 hr/day medical supervision in concert with the patient's rehab needs make it unreasonable for this patient to be served in a less intensive setting? Yes 2. Co-Morbidities requiring supervision/potential complications: afib, htn, ucontrolled DM,  3. Due to bladder management, bowel management, safety, skin/wound care, disease management, medication administration, pain management and patient education, does the patient require 24 hr/day rehab nursing? Yes 4. Does the patient require coordinated care of a physician, rehab nurse, PT (1-2 hrs/day, 5 days/week), OT (1-2 hrs/day, 5 days/week) and SLP (1-2 hrs/day, 5 days/week) to address physical and functional deficits in the context of the above medical diagnosis(es)? Yes Addressing  deficits in the following areas: balance, endurance, locomotion, strength, transferring, bowel/bladder control, bathing, dressing, feeding, grooming, toileting and psychosocial support 5. Can the patient actively participate in an intensive therapy program of at least 3 hrs of therapy per day at least 5 days per week? Yes 6. The potential for patient to make measurable gains while on inpatient rehab is excellent 7. Anticipated functional outcomes upon discharge from inpatient rehab are modified independent and supervision  with PT, modified independent and supervision with OT, modified independent with SLP. 8. Estimated rehab length of stay to reach the above functional goals is: potentially 14-20 days 9. Does the patient have adequate social supports and living environment to accommodate these discharge functional goals? Yes 10. Anticipated D/C setting: Home 11. Anticipated post D/C treatments: HH therapy and Outpatient therapy 12. Overall Rehab/Functional Prognosis: excellent  RECOMMENDATIONS: This patient's condition is appropriate for continued rehabilitative care in the following setting: CIR Patient has agreed to participate in recommended program. Yes Note that insurance prior authorization may be required for reimbursement for recommended care.  Comment: Pt was admitted yesterday. Although his symptoms are more pronounced on the right side, it appears that he's been having balance issues for awhile now. Await therapy input and further neuro work up. Rehab Admissions Coordinator to follow up.  Thanks,  Ranelle Oyster, MD, [place] [facility]    Charlton Amor., PA-C 06/29/2016

## 2016-06-29 NOTE — Care Management Note (Signed)
Case Management Note Donn Pierini RN, BSN Unit 2W-Case Manager [phone]  Patient Details  Name: DUEL CONRAD MRN: [id number] Date of Birth: 10/05/1945  Subjective/Objective:  Pt admitted with ischemic stroke                  Action/Plan: PTA pt lived at home with wife- per PT/OT evals recommendations for CIR, CIR consulted- per Britta Mccreedy with CIR- looking at possible bed for Monday 4/16  Expected Discharge Date:                  Expected Discharge Plan:  IP Rehab Facility  In-House Referral:     Discharge planning Services  CM Consult  Post Acute Care Choice:    Choice offered to:     DME Arranged:    DME Agency:     HH Arranged:    HH Agency:     Status of Service:  In process, will continue to follow  If discussed at Long Length of Stay Meetings, dates discussed:    Additional Comments:  Darrold Span, RN 06/29/2016, 4:33 PM

## 2016-06-29 NOTE — PMR Pre-admission (Signed)
PMR Admission Coordinator Pre-Admission Assessment  Patient: Luis Yu is an 71 y.o., male MRN: [id number] DOB: [birth year] Height:  (188 cm) Weight: 99.8 kg (220 lb)             Insurance Information HMO:   PPO:      PCP:      IPA:      80/20: yes     OTHER: no HMO PRIMARY: Medicare a and b      Policy#: [id number]      Subscriber: pt Benefits:  Phone #: online     Name: 06/29/2016 Eff. Date: 10/18/2006     Deduct: $1340      Out of Pocket Max: none      Life Max: none CIR: 100%      SNF: 20 full days Outpatient: 80%     Co-Pay: 20% Home Health: 100%      Co-Pay: none DME: 80%     Co-Pay: 20% Providers: pt choice  SECONDARY: [employer]      Policy#: [id number]      Subscriber: pt  Medicaid Application Date:       Case Manager:  Disability Application Date:       Case Worker:   Emergency Contact Information Contact Information    Name Relation Home Work Mobile   Yu,Luis Spouse [phone]     Current Medical History  Patient Admitting Diagnosis: left frontal infarct, preexisting gait disorder with ataxia  History of Present Illness: HPI: Luis Yu is a 71 y.o. right handed male with history of bilateral TKA, CAD status post CABG, chronic atrial fibrillation maintained on aspirin and Eliquis, hypertension, diabetes mellitus.  Presented 06/28/2016 after being referred to the ER by primary care physician after a fall while at the office with persistent nausea and vomiting. In the ER CT of the head and neck was unremarkable was planned discharge to home. While waiting outside for his wife to pick him up patient with ongoing nausea and vomiting a repeat CT of the head was again negative. Blood pressure 130/110. MRI of the brain showed punctate focus of acute ischemia within the posterior left frontal white matter without hemorrhage or mass effect. Chronic microvascular ischemia and old lacunar infarcts. MRA was unremarkable. Carotid Dopplers with mild  to moderate heterogeneous plaque in bilateral carotid artery suggestive of less than 50% stenosis. Echocardiogram pending. Neurology follow-up patient remains on aspirin and Eliquis as prior to admission. Tolerating a regular consistency diet.  Total: 0 NIH  Past Medical History  Past Medical History:  Diagnosis Date  . Adjustment disorder with mixed emotional features   . Atrial flutter (HCC)   . CAD (coronary artery disease)   . Depression   . Diabetes mellitus type II, uncontrolled (HCC)   . Diabetic peripheral neuropathy (HCC)   . Erectile dysfunction   . GERD (gastroesophageal reflux disease)   . Herpes zoster   . Hyperlipidemia   . Hypertension   . Obesity   . Osteoarthritis   . Postherpetic neuralgia   . Sleep apnea     Family History  family history includes Coronary artery disease in his father and mother; Stroke in his brother.  Prior Rehab/Hospitalizations:  Has the patient had major surgery during 100 days prior to admission? No  Current Medications   Current Facility-Administered Medications:  .   stroke: mapping our early stages of recovery book, , Does not apply, Once, Eduard Clos, MD .  acetaminophen (TYLENOL)  tablet 650 mg, 650 mg, Oral, Q4H PRN, 650 mg at 07/01/16 1013 **OR** acetaminophen (TYLENOL) solution 650 mg, 650 mg, Per Tube, Q4H PRN **OR** acetaminophen (TYLENOL) suppository 650 mg, 650 mg, Rectal, Q4H PRN, Eduard Clos, MD .  acetaminophen (TYLENOL) tablet 1,000 mg, 1,000 mg, Oral, Once, Lavera Guise, MD .  apixaban [facility]) tablet 5 mg, 5 mg, Oral, BID, Eduard Clos, MD, 5 mg at 07/02/16 0941 .  aspirin EC tablet 81 mg, 81 mg, Oral, Daily, Eduard Clos, MD, 81 mg at 07/02/16 0941 .  atorvastatin (LIPITOR) tablet 80 mg, 80 mg, Oral, Daily, Eduard Clos, MD, 80 mg at 07/02/16 0942 .  buPROPion [facility]) 12 hr tablet 150 mg, 150 mg, Oral, BID, Eduard Clos, MD, 150 mg at 07/02/16 0941 .  citalopram  (CELEXA) tablet 20 mg, 20 mg, Oral, Daily, Eduard Clos, MD, 20 mg at 07/02/16 0941 .  cyanocobalamin ((VITAMIN B-12)) injection 1,000 mcg, 1,000 mcg, Intramuscular, q morning - 10a, Richarda Overlie, MD, 1,000 mcg at 07/01/16 1014 .  insulin aspart (novoLOG) injection 0-9 Units, 0-9 Units, Subcutaneous, TID WC, Eduard Clos, MD, 2 Units at 07/01/16 1641 .  insulin glargine (LANTUS) injection 20 Units, 20 Units, Subcutaneous, QHS, Eduard Clos, MD, 20 Units at 07/01/16 2220 .  isosorbide mononitrate (IMDUR) 24 hr tablet 30 mg, 30 mg, Oral, Daily, Eduard Clos, MD, 30 mg at 07/02/16 0941 .  nebivolol (BYSTOLIC) tablet 20 mg, 20 mg, Oral, Daily, Eduard Clos, MD, 20 mg at 07/02/16 0941 .  pantoprazole (PROTONIX) EC tablet 40 mg, 40 mg, Oral, Daily, Eduard Clos, MD, 40 mg at 07/02/16 0941 .  thiamine (VITAMIN B-1) tablet 100 mg, 100 mg, Oral, Daily, Richarda Overlie, MD, 100 mg at 07/02/16 0941  Patients Current Diet: Diet heart healthy/carb modified Room service appropriate? Yes; Fluid consistency: Thin Diet - low sodium heart healthy  Precautions / Restrictions Precautions Precautions: Fall Restrictions Weight Bearing Restrictions: No   Has the patient had 2 or more falls or a fall with injury in the past year?Yes over last 6 months has frequent falls resulting in rib fractures, and broken toe  Prior Activity Level Limited Community (1-2x/wk): decreased functional gait over past 6 months  Home Assistive Devices / Equipment Home Assistive Devices/Equipment: Medical laboratory scientific officer (specify quad or straight), Wheelchair Home Equipment: Walker - 4 wheels, Shower seat  Prior Device Use: Indicate devices/aids used by the patient prior to current illness, exacerbation or injury? Walker  Prior Functional Level Prior Function Level of Independence: Independent with assistive device(s) Comments: rollator for all mobility. Per aptients wife Pulmonary MD reccomended that he stop  PT 2nd to pulmonary reasons. Since that point his wife reports he has become more sednetary. He mostly transfers from his bed to his chair.   Self Care: Did the patient need help bathing, dressing, using the toilet or eating?  Needed some help  Indoor Mobility: Did the patient need assistance with walking from room to room (with or without device)? Needed some help  Stairs: Did the patient need assistance with internal or external stairs (with or without device)? Needed some help  Functional Cognition: Did the patient need help planning regular tasks such as shopping or remembering to take medications? Needed some help  Current Functional Level Cognition  Overall Cognitive Status: Impaired/Different from baseline Orientation Level: Oriented X4 Following Commands: Follows one step commands consistently, Follows one step commands with increased time Safety/Judgement: Decreased awareness of safety,  Decreased awareness of deficits General Comments: Spoke with patients wife. At baseline patient was able to follow all commands. He was sedentary     Extremity Assessment (includes Sensation/Coordination)  Upper Extremity Assessment: Defer to OT evaluation  Lower Extremity Assessment: Generalized weakness    ADLs  Overall ADL's : Needs assistance/impaired Eating/Feeding: Set up, Sitting Grooming: Wash/dry face, Wash/dry hands, Brushing hair, Oral care, Minimal assistance, Standing, Cueing for safety Grooming Details (indicate cue type and reason): Pt stood at sink for appx 10 minutes with one 3 minute rest break to complete all grooming.  Pt had occasional swaying with min assist to correct balance.   Upper Body Bathing: Moderate assistance, Sitting Lower Body Bathing: Maximal assistance, Sit to/from stand Upper Body Dressing : Minimal assistance, Sitting Lower Body Dressing: Minimal assistance, Sit to/from stand, Cueing for safety Lower Body Dressing Details (indicate cue type and reason):  Pt able to donn pants and socks with min assist to stand and pull pants up.  Pt also requires assist managing zippers and snaps because he loses balance when he lets go of the walker with both hands.   Toilet Transfer: Minimal assistance, Ambulation, BSC, Regular Toilet, RW Toilet Transfer Details (indicate cue type and reason): Pt walked to bathroom with 3:1 over commode.  Pt is impulsive with his movement. Toileting- Architect and Hygiene: Min guard, Sit to/from stand Functional mobility during ADLs: Minimal assistance, Rolling walker General ADL Comments: Pt continues to have balance deficits in standing.  Pt acknowledges the deficits but when asked if it is safe for him to get up alone, he always answers yes.  Reviewed with pt at length the need to call for help to get up.  Pt did ask if he would have "all these alarms" on his chairs adn bed on the rehab unit.  Explained to him that his therapy team would decide when they feel it is safe for him to transfer on his own and he is free to ask them at any time if he is improving with his transfer safety.    Mobility  Overal bed mobility: Modified Independent Bed Mobility: Sit to Supine Sit to supine: Min assist General bed mobility comments: use of rail     Transfers  Overall transfer level: Needs assistance Equipment used: Rolling walker (2 wheeled) Transfers: Sit to/from Stand Sit to Stand: Min guard General transfer comment: cues for hand placement and safety with pt able to stand from bed and chair    Ambulation / Gait / Stairs / Wheelchair Mobility  Ambulation/Gait Ambulation/Gait assistance: Architect (Feet): 120 Feet Assistive device: Rolling walker (2 wheeled) Gait Pattern/deviations: Step-through pattern, Decreased stride length, Trunk flexed General Gait Details: cues for posture and position in RW. Pt able to increase distance today but notes increased DOE with increased distance Gait velocity  interpretation: Below normal speed for age/gender    Posture / Balance Dynamic Sitting Balance Sitting balance - Comments: pt able to sit EOB 4 min without lean or assist today Static Standing Balance Tandem Stance - Right Leg: 0 Tandem Stance - Left Leg: 0 Rhomberg - Eyes Opened: 30 Rhomberg - Eyes Closed: 10 Balance Overall balance assessment: Needs assistance, History of Falls Sitting-balance support: Feet supported, No upper extremity supported Sitting balance-Leahy Scale: Good Sitting balance - Comments: pt able to sit EOB 4 min without lean or assist today Postural control: Posterior lean, Right lateral lean Standing balance support: Bilateral upper extremity supported, During functional activity Standing balance-Leahy Scale:  Fair Standing balance comment: Pt able to stand some at the sink unsupported while combing his hair.  Feel better if he has a solid surface like a sink in front of him.   Tandem Stance - Right Leg: 0 Tandem Stance - Left Leg: 0 Rhomberg - Eyes Opened: 30 Rhomberg - Eyes Closed: 10 High Level Balance Comments: pt requires HHA to position feet into Rhomberg but can maintain once there    Special needs/care consideration BiPAP/CPAP  yes CPM  no Continuous Drip IV  N/a Dialysis  N/a Life Vest n/a Oxygen  N/a Special Bed  N/a Trach Size  N/a Wound Vac (area)  N/a Skin No                              Bowel mgmt: Last BM 06/29/16 Bladder mgmt: WDL Diabetic mgmt Hgb A1C 6.9, yes diabetic, uncontrolled at times Fall risk:  Yes, frequent falls   Previous Home Environment Living Arrangements: Spouse/significant other  Lives With: Spouse Available Help at Discharge: Family, Available 24 hours/day Type of Home: House Home Layout: One level Home Access: Stairs to enter Secretary/administrator of Steps: 3 Bathroom Shower/Tub: Health visitor: Handicapped height Bathroom Accessibility: Yes How Accessible: Accessible via walker Home Care  Services: No  Discharge Living Setting Plans for Discharge Living Setting: Patient's home, Lives with (comment) (wife) Type of Home at Discharge: House Discharge Home Layout: One level Discharge Home Access: Stairs to enter Entrance Stairs-Rails: None Entrance Stairs-Number of Steps: 3 Discharge Bathroom Shower/Tub: Walk-in shower Discharge Bathroom Toilet: Handicapped height Discharge Bathroom Accessibility: Yes How Accessible: Accessible via walker Does the patient have any problems obtaining your medications?: No  Patient and spouse have homes in [place] s well as [place]. Returned to [facility] where is MDs were due to dev=declining functional mobility  Social/Family/Support Systems Patient Roles: Spouse Contact Information: [name], wife Anticipated Caregiver: wife Anticipated Industrial/product designer Information: see above Ability/Limitations of Caregiver: no limitations Caregiver Availability: 24/7 Discharge Plan Discussed with Primary Caregiver: Yes Is Caregiver In Agreement with Plan?: Yes Does Caregiver/Family have Issues with Lodging/Transportation while Pt is in Rehab?: No  Goals/Additional Needs Patient/Family Goal for Rehab: Mod I to supervision with PT, OT, and SLP Expected length of stay: ELOS 14-20 days Pt/Family Agrees to Admission and willing to participate: Yes Program Orientation Provided & Reviewed with Pt/Caregiver Including Roles  & Responsibilities: Yes  Decrease burden of Care through IP rehab admission: n/a  Possible need for SNF placement upon discharge: not anticipated  Patient Condition: This patient's medical and functional status has changed since the consult dated: 06/29/2016 in which the Rehabilitation Physician determined and documented that the patient's condition is appropriate for intensive rehabilitative care in an inpatient rehabilitation facility. See "History of Present Illness" (above) for medical update. Functional changes are: Currently requiring min  assist to ambulate 120 feet RW. Patient's medical and functional status update has been discussed with the Rehabilitation physician and patient remains appropriate for inpatient rehabilitation. Will admit to inpatient rehab today.  Preadmission Screen Completed By:  Trish Mage, 07/02/2016 11:18 AM ______________________________________________________________________   Discussed status with Dr. Allena Katz on 07/02/16 at 1118 and received telephone approval for admission today.  Admission Coordinator:  Trish Mage, time 1118/Date 07/02/16

## 2016-06-29 NOTE — Progress Notes (Signed)
Pt took off his telemetry meter and refuses to wear it. Stating it's too big and needs to be smaller because it bothers him. I've educated pt on importance of it. And he still refuses. MD notified.

## 2016-06-30 ENCOUNTER — Inpatient Hospital Stay (HOSPITAL_COMMUNITY): Payer: Medicare Other

## 2016-06-30 DIAGNOSIS — I639 Cerebral infarction, unspecified: Secondary | ICD-10-CM

## 2016-06-30 LAB — GLUCOSE, CAPILLARY
Glucose-Capillary: 110 mg/dL — ABNORMAL HIGH (ref 65–99)
Glucose-Capillary: 259 mg/dL — ABNORMAL HIGH (ref 65–99)
Glucose-Capillary: 95 mg/dL (ref 65–99)
Glucose-Capillary: 194 mg/dL — ABNORMAL HIGH (ref 65–99)

## 2016-06-30 MED ORDER — PERFLUTREN LIPID MICROSPHERE
1.0000 mL | INTRAVENOUS | Status: AC | PRN
  Administered 2016-06-30 (×2): 2 mL via INTRAVENOUS
  Administered 2016-06-30: 1 mL via INTRAVENOUS
  Filled 2016-06-30: qty 10

## 2016-06-30 NOTE — Progress Notes (Signed)
Pt placed on CPAP auto mode for the night. Tol well.

## 2016-06-30 NOTE — Progress Notes (Signed)
Patient refusing the use of BIPAP for tonight. RT informed patient if he changes his mind have RN contact RT.

## 2016-06-30 NOTE — Progress Notes (Signed)
[facility] PROGRESS NOTE  Luis Yu ZOX:[id number] DOB: 05/02/1945 DOA: 06/27/2016   PCP: Shirline Frees, NP     Assessment/Plan: Principal Problem:   Ischemic stroke of frontal lobe (HCC) Active Problems:   DM (diabetes mellitus) type II uncontrolled with retinopathy   OSA (obstructive sleep apnea)   Essential hypertension   Coronary atherosclerosis   Atrial fibrillation (HCC)   Nausea & vomiting   Thrombocytopenia (HCC)   Renal failure (ARF), acute on chronic (HCC)   Stroke (cerebrum) (HCC)   Acute CVA (cerebrovascular accident) (HCC)   Acute kidney injury (HCC)    71 y.o.malewith history of CAD status post CABG, chronic atrial fibrillation, hypertension, diabetes mellitus and sleep apnea was referred to the ER by patient's primary care physician yesterday after patient had a fall at the office. Patient states 2 days ago patient started having persistent nausea vomiting which improved by evening 2 days ago. History patient had gone to the PCPs office and had loss of balance and fell.   MRI of the brain which shows acute ischemia involving the left frontal lobe. Patient admitted for acute stroke  Assessment/Plan 1. Left frontal stroke -   neurology following . Neurology is recommended to continue Apixaban. Stroke/swallow evaluation heart healthy. Continue neuro checks.  MRA brain negative,EEG negative,  2-D echo pending,  carotid Doppler-Mild to moderate heterogeneous plaque in bilateral carotid arteries suggestive of less than 50% stenosis ,  hemoglobin A1c 7.6  and lipid panel-LDL 50.Continue eliquis and aspirin on discharge. Follow-up with Dr. Everlena Cooper at [facility] in 4 weeks. 2. Acute on chronic renal failure stage III -baseline creatinine 1.15. Creatinine is back to baseline 1.19. Creatinine 1.77 on admission, now 1.94>1.19. probably from nausea vomiting prior to admission. UA is unremarkable. Continued hydration. Hold ARB and hydrochlorothiazide.   3. Persistent  nausea vomiting - patient states it has improved , tolerating diet. Abdomen appears benign. Likely could be viral gastroenteritis given patient's wife also has same symptoms.   4. Paroxysmal atrial fibrillation - rate controlled chads 2 vasc score is more than 2 patient is on Apixaban. On metoprolol. 5. CAD status post CABG - denies any chest pain. Troponin negative 2 6. Diabetes mellitus type 2 - will hold metformin and glipizide since patient is having acute renal failure. Continue Lantus with pre-meal NovoLog and sliding scale coverage. Hemoglobin A1c 7.6 7. Thrombocytopenia - appears to be new.    151 on 02/15/16, now 101, will recheck  8. Anemia - hemoglobin stable around 11.3 9. FTT -multifactorial, CK slightly elevated, vitamin B-12- 214 , TSH normal. Started on B-12 supplementation 10. Weight loss-patient had an CT scan last month that did not show any obvious malignancy. He will need age-appropriate cancer screening by PCP. Findings of the CT scan were discussed with the patient's wife  DVT prophylaxsis eliquis  Code Status:  Full code      Family Communication: Discussed in detail with the patient/wife, all imaging results, lab results explained to the patient   Disposition Plan: CIR Monday     Consultants:  neurology  Procedures:  None   Antibiotics: Anti-infectives    None         HPI/Subjective: Patient appears to be comfortable, speech is intact but slow, no gross focal neurologic deficits   Objective: Vitals:   06/29/16 0624 06/29/16 1433 06/29/16 2042 06/30/16 0610  BP: (!) 148/89 128/71 (!) 141/79 139/89  Pulse: 78  (!) 59 80  Resp: 18  18  20  Temp: 98.6 F (37 C) 98.4 F (36.9 C) 97.7 F (36.5 C) 98.3 F (36.8 C)  TempSrc: Oral Oral Oral Oral  SpO2: 93%  98% 98%  Weight:      Height:        Intake/Output Summary (Last 24 hours) at 06/30/16 1011 Last data filed at 06/29/16 2300  Gross per 24 hour  Intake              240 ml  Output               675 ml  Net             -435 ml    Exam:  Examination:  General exam: Appears calm and comfortable  Respiratory system: Clear to auscultation. Respiratory effort normal. Cardiovascular system: S1 & S2 heard, RRR. No JVD, murmurs, rubs, gallops or clicks. No pedal edema. Gastrointestinal system: Abdomen is nondistended, soft and nontender. No organomegaly or masses felt. Normal bowel sounds heard. Central nervous system: Alert and oriented. No focal neurological deficits. Extremities: Symmetric 5 x 5 power. Skin: No rashes, lesions or ulcers Psychiatry: Judgement and insight appear normal. Mood & affect appropriate.     Data Reviewed: I have personally reviewed following labs and imaging studies  Micro Results No results found for this or any previous visit (from the past 240 hour(s)).  Radiology Reports Dg Chest 2 View  Result Date: 06/27/2016 CLINICAL DATA:  Left-sided chest pain, cough, and chest congestion. Coronary artery disease. EXAM: CHEST  2 VIEW COMPARISON:  02/15/2016 FINDINGS: The heart size and mediastinal contours are within normal limits. Prior CABG. Both lungs are clear. The visualized skeletal structures are unremarkable. IMPRESSION: Stable exam.  No active cardiopulmonary disease. Electronically Signed   By: Myles Rosenthal M.D.   On: 06/27/2016 18:41   Dg Abd 1 View  Result Date: 06/28/2016 CLINICAL DATA:  Intractable nausea, vomiting for 2 days EXAM: ABDOMEN - 1 VIEW COMPARISON:  CT 05/24/2016 FINDINGS: Nonobstructive bowel gas pattern. Moderate stool burden throughout the colon. No organomegaly or free air. No suspicious calcification. Degenerative changes in the hips and lower lumbar spine. IMPRESSION: Moderate stool burden in the colon. No evidence of obstruction or free air. Electronically Signed   By: Charlett Nose M.D.   On: 06/28/2016 10:41   Ct Head Wo Contrast  Result Date: 06/28/2016 CLINICAL DATA:  Patient fell on concrete hitting top of head. EXAM:  CT HEAD WITHOUT CONTRAST TECHNIQUE: Contiguous axial images were obtained from the base of the skull through the vertex without intravenous contrast. COMPARISON:  None. FINDINGS: BRAIN: There mild is sulcal and ventricular prominence consistent with superficial and central atrophy. No intraparenchymal hemorrhage, mass effect nor midline shift. Periventricular and subcortical white matter hypodensities consistent with chronic small vessel ischemic disease are identified. No acute large vascular territory infarcts. No abnormal extra-axial fluid collections. Basal cisterns are not effaced and midline. VASCULAR: Moderate calcific atherosclerosis of the carotid siphons and both vertebral arteries. SKULL: No skull fracture. No significant scalp soft tissue swelling. SINUSES/ORBITS: The mastoid air-cells are clear. The included paranasal sinuses are well-aerated within the left maxillary sinus with partially visualized small mucous retention cystThe included ocular globes and orbital contents are non-suspicious. OTHER: None. IMPRESSION: 1. Chronic appearing small vessel ischemic disease of periventricular white matter with cerebral mild atrophy. 2. No acute intracranial abnormality. Electronically Signed   By: Tollie Eth M.D.   On: 06/28/2016 01:36   Ct Head Wo Contrast  Result  Date: 06/27/2016 CLINICAL DATA:  Tripped over a step, hit top of his head, patient takes blood thinner EXAM: CT HEAD WITHOUT CONTRAST TECHNIQUE: Contiguous axial images were obtained from the base of the skull through the vertex without intravenous contrast. COMPARISON:  05/24/2016 FINDINGS: Brain: No intracranial hemorrhage, mass effect or midline shift. No acute cortical infarction. Stable atrophy in chronic small vessel ischemic changes bilateral periventricular and patchy subcortical white matter. No mass lesion is noted on this unenhanced scan. Ventricular size is stable from prior exam. No intraventricular hemorrhage. Vascular:  Atherosclerotic calcifications of carotid siphon again noted. Atherosclerotic calcifications bilateral vertebral arteries. Skull: There is no skull fracture. Sinuses/Orbits: Probable mucous retention cyst partially visualized left maxillary sinus measures 1.4 cm. Other: None IMPRESSION: No acute intracranial abnormality. Stable atrophy and chronic white matter small vessel ischemic changes. No definite acute cortical infarction. Electronically Signed   By: Natasha Mead M.D.   On: 06/27/2016 17:24   Ct Cervical Spine Wo Contrast  Result Date: 06/27/2016 CLINICAL DATA:  71 y/o M; status post fall with head injury. No neck complaints at this time. EXAM: CT CERVICAL SPINE WITHOUT CONTRAST TECHNIQUE: Multidetector CT imaging of the cervical spine was performed without intravenous contrast. Multiplanar CT image reconstructions were also generated. COMPARISON:  None. FINDINGS: Alignment: Straightening of cervical lordosis with mild reversal at the C5-6 level. No listhesis. Slight clockwise rotation of C1 on C2 is likely positional. Skull base and vertebrae: No acute fracture. No primary bone lesion or focal pathologic process. Soft tissues and spinal canal: No prevertebral fluid or swelling. No visible canal hematoma. Disc levels: Moderate cervical spondylosis with with predominantly discogenic degenerative changes at the C6 through T1 levels and left-greater-than-right facet degenerative changes. There are multiple levels of canal stenosis greatest at C6-7 where it is at least moderate. No high-grade bony foraminal narrowing. Extensive productive changes of the anterior C1-2 articulation. Upper chest: Negative. Other: Moderate calcific atherosclerosis of the carotid bifurcations bilaterally. Otherwise negative. IMPRESSION: 1. No acute fracture or dislocation is identified. 2. Moderate cervical spondylosis greatest at C6-7 where there is at least moderate canal stenosis. Electronically Signed   By: Mitzi Hansen M.D.   On: 06/27/2016 18:41   Mr Brain Wo Contrast  Result Date: 06/28/2016 CLINICAL DATA:  Ataxia and vomiting EXAM: MRI HEAD WITHOUT CONTRAST TECHNIQUE: Multiplanar, multiecho pulse sequences of the brain and surrounding structures were obtained without intravenous contrast. COMPARISON:  Head CT 06/28/2016 Brain MRI 02/16/2013 FINDINGS: Brain: There is a punctate focus of diffusion restriction within the posterior left frontal white matter, near the base of the left central sulcus. There is beginning confluent hyperintense T2-weighted signal within the periventricular white matter, most often seen in the setting of chronic microvascular ischemia. Old lacunar infarct in the superior left frontal white matter is unchanged. No mass lesion or midline shift. No hydrocephalus or extra-axial fluid collection. The midline structures are normal. No age advanced or lobar predominant atrophy. Vascular: Major intracranial arterial and venous sinus flow voids are preserved. No evidence of chronic microhemorrhage or amyloid angiopathy. Skull and upper cervical spine: The visualized skull base, calvarium, upper cervical spine and extracranial soft tissues are normal. Sinuses/Orbits: No fluid levels or advanced mucosal thickening. No mastoid effusion. Normal orbits. IMPRESSION: 1. Punctate focus of acute ischemia within the posterior left frontal white matter without hemorrhage or mass effect. 2. Chronic microvascular ischemia and old lacunar infarcts, unchanged compared to the 02/16/2013. Electronically Signed   By: Deatra Robinson M.D.   On:  06/28/2016 06:02   US Renal  Result Date: 06/28/2016 CLINICAL DATA:  Acute kidney injury EXAM: RENAL / URINARY TRACT ULTRASOUND COMPLETE COMPARISON:  05/24/2016 CT abdomen/ pelvis. FINDINGS: Right Kidney: Length: 13.9 cm. Mildly echogenic right renal parenchyma, which is normal in thickness. No right hydronephrosis. Simple appearing exophytic 1.5 x 0.9 x 1.0 cm renal  cyst in the medial upper right kidney. No additional right renal lesions. Left Kidney: Length: 13.7 cm. Mildly echogenic left renal parenchyma, which is normal in thickness. No left hydronephrosis. No left renal mass . Bladder: Appears normal for degree of bladder distention. IMPRESSION: 1. No hydronephrosis. 2. Mildly echogenic normal size kidneys, compatible with the provided history of nonspecific acute renal parenchymal disease. 3. Small simple right renal cyst . 4. Normal bladder. Electronically Signed   By: Delbert Phenix M.D.   On: 06/28/2016 10:43   Mr Maxine Glenn Head/brain ZO Cm  Result Date: 06/28/2016 CLINICAL DATA:  Infarct on brain MRI EXAM: MRA HEAD WITHOUT CONTRAST TECHNIQUE: Angiographic images of the Circle of Willis were obtained using MRA technique without intravenous contrast. COMPARISON:  Brain MRI from earlier today FINDINGS: Symmetric enhancement of the carotid and vertebral arteries at the skullbase. Mild asymmetry of carotid size, larger on the left, attributed to larger A1 on the left.Mild left vertebral artery dominance. Standard vertebrobasilar branching. There is no major branch occlusion or flow limiting stenosis. Atherosclerotic irregularity bilateral carotid siphons with mild bilateral supraclinoid ICA narrowing, borderline moderate on the right. Posterior circulation vessels are smooth. Tiny inferior outpouchings from the bilateral supraclinoid ICA is attributed to infundibula based on source images. No generalized vessel beading. IMPRESSION: 1. No acute finding. 2. Intracranial atherosclerotic changes with mild bilateral supraclinoid ICA narrowing. Electronically Signed   By: Marnee Spring M.D.   On: 06/28/2016 13:21     CBC  Recent Labs Lab 06/27/16 1807 06/28/16 0052 06/28/16 0718 06/29/16 0308  WBC 9.8 7.6 6.3 6.9  HGB 13.7 12.2* 12.1* 11.3*  HCT 43.4 37.9* 38.1* 35.0*  PLT 136* 124* 121* 101*  MCV 92.7 92.9 92.9 92.3  MCH 29.3 29.9 29.5 29.8  MCHC 31.6 32.2 31.8  32.3  RDW 15.7* 16.0* 16.1* 16.2*  LYMPHSABS 0.7 0.7 0.8  --   MONOABS 0.6 0.4 0.3  --   EOSABS 0.1 0.1 0.3  --   BASOSABS 0.0 0.0 0.0  --     Chemistries   Recent Labs Lab 06/27/16 1807 06/28/16 0052 06/28/16 0821 06/29/16 0308  NA 138 137 137 136  K 4.5 4.5 4.0 3.8  CL 102 104 104 107  CO2 GLUCOSE 177* 199* 165* 181*  BUN 30* 34* 33* 30*  CREATININE 1.77* 1.94* 1.61* 1.19  CALCIUM 8.7* 8.1* 8.0* 7.9*  AST ALT ALKPHOS 102 85 81 82  BILITOT 1.1 0.8 1.0 0.6   ------------------------------------------------------------------------------------------------------------------ estimated creatinine clearance is 70.8 mL/min (by C-G formula based on SCr of 1.19 mg/dL). ------------------------------------------------------------------------------------------------------------------  Recent Labs  06/28/16 0718  HGBA1C 7.6*   ------------------------------------------------------------------------------------------------------------------  Recent Labs  06/28/16 0718  CHOL 96  HDL 28*  LDLCALC 50  TRIG 88  CHOLHDL 3.4   ------------------------------------------------------------------------------------------------------------------  Recent Labs  06/28/16 1121  TSH 0.983   ------------------------------------------------------------------------------------------------------------------  Recent Labs  06/28/16 0718 06/28/16 0821 06/28/16 1121  VITAMINB12  --  214 230  FOLATE  --  8.7 7.9  FERRITIN  --  39 47  TIBC  --  277 281  IRON  --  34* 27*  RETICCTPCT 1.3  --  1.2    Coagulation profile No results for input(s): INR, PROTIME in the last 168 hours.  No results for input(s): DDIMER in the last 72 hours.  Cardiac Enzymes  Recent Labs Lab 06/28/16 0308 06/28/16 0821  TROPONINI <0.03 <0.03    ------------------------------------------------------------------------------------------------------------------ Invalid input(s): POCBNP   CBG:  Recent Labs Lab 06/28/16 2105 06/29/16 0621 06/29/16 1654 06/29/16 2151 06/30/16 0609  GLUCAP 108* 157* 143* 119* 110*       Studies: Dg Abd 1 View  Result Date: 06/28/2016 CLINICAL DATA:  Intractable nausea, vomiting for 2 days EXAM: ABDOMEN - 1 VIEW COMPARISON:  CT 05/24/2016 FINDINGS: Nonobstructive bowel gas pattern. Moderate stool burden throughout the colon. No organomegaly or free air. No suspicious calcification. Degenerative changes in the hips and lower lumbar spine. IMPRESSION: Moderate stool burden in the colon. No evidence of obstruction or free air. Electronically Signed   By: Charlett Nose M.D.   On: 06/28/2016 10:41   US Renal  Result Date: 06/28/2016 CLINICAL DATA:  Acute kidney injury EXAM: RENAL / URINARY TRACT ULTRASOUND COMPLETE COMPARISON:  05/24/2016 CT abdomen/ pelvis. FINDINGS: Right Kidney: Length: 13.9 cm. Mildly echogenic right renal parenchyma, which is normal in thickness. No right hydronephrosis. Simple appearing exophytic 1.5 x 0.9 x 1.0 cm renal cyst in the medial upper right kidney. No additional right renal lesions. Left Kidney: Length: 13.7 cm. Mildly echogenic left renal parenchyma, which is normal in thickness. No left hydronephrosis. No left renal mass . Bladder: Appears normal for degree of bladder distention. IMPRESSION: 1. No hydronephrosis. 2. Mildly echogenic normal size kidneys, compatible with the provided history of nonspecific acute renal parenchymal disease. 3. Small simple right renal cyst . 4. Normal bladder. Electronically Signed   By: Delbert Phenix M.D.   On: 06/28/2016 10:43   Mr Maxine Glenn Head/brain ZO Cm  Result Date: 06/28/2016 CLINICAL DATA:  Infarct on brain MRI EXAM: MRA HEAD WITHOUT CONTRAST TECHNIQUE: Angiographic images of the Circle of Willis were obtained using MRA technique  without intravenous contrast. COMPARISON:  Brain MRI from earlier today FINDINGS: Symmetric enhancement of the carotid and vertebral arteries at the skullbase. Mild asymmetry of carotid size, larger on the left, attributed to larger A1 on the left.Mild left vertebral artery dominance. Standard vertebrobasilar branching. There is no major branch occlusion or flow limiting stenosis. Atherosclerotic irregularity bilateral carotid siphons with mild bilateral supraclinoid ICA narrowing, borderline moderate on the right. Posterior circulation vessels are smooth. Tiny inferior outpouchings from the bilateral supraclinoid ICA is attributed to infundibula based on source images. No generalized vessel beading. IMPRESSION: 1. No acute finding. 2. Intracranial atherosclerotic changes with mild bilateral supraclinoid ICA narrowing. Electronically Signed   By: Marnee Spring M.D.   On: 06/28/2016 13:21      Lab Results  Component Value Date   HGBA1C 7.6 (H) 06/28/2016   HGBA1C 6.9 04/24/2016   HGBA1C 7.0 01/10/2016   Lab Results  Component Value Date   MICROALBUR 1.8 06/23/2014   LDLCALC 50 06/28/2016   CREATININE 1.19 06/29/2016       Scheduled Meds: .  stroke: mapping our early stages of recovery book   Does not apply Once  . acetaminophen  1,000 mg Oral Once  . apixaban  5 mg Oral BID  . aspirin EC  81 mg Oral Daily  . atorvastatin  80 mg Oral Daily  . buPROPion  150 mg Oral BID  . citalopram  20 mg Oral Daily  . cyanocobalamin  1,000 mcg Intramuscular q morning - 10a  . insulin aspart  0-9 Units Subcutaneous TID WC  . insulin aspart  10 Units Subcutaneous TID WC  . insulin glargine  20 Units Subcutaneous QHS  . isosorbide mononitrate  30 mg Oral Daily  . nebivolol  20 mg Oral Daily  . pantoprazole  40 mg Oral Daily  . thiamine  100 mg Oral Daily   Continuous Infusions:   LOS: 2 days    Time spent: >30 MINS    Richarda Overlie  [facility] Pager [phone]. If 7PM-7AM, please  contact night-coverage at [email or web address], password [facility] 06/30/2016, 10:11 AM  LOS: 2 days

## 2016-06-30 NOTE — Progress Notes (Signed)
*  PRELIMINARY RESULTS* Echocardiogram 2D Echocardiogram has been performed with Definity.  Stacey Drain 06/30/2016, 5:20 PM

## 2016-06-30 NOTE — Progress Notes (Signed)
Physical Therapy Treatment Patient Luis Yu: Luis Yu MRN: [id number] DOB: December 10, 1945 Today's Date: 06/30/2016    History of Present Illness Pt is a 71 y.o. male presenting after fall and persistent nausea and vomiting. MRI on 4/12 + for acute ischemia involving the L frontal lobe. PMHx: Atrial flutter, CAD, Depression, DM2, Diabetic peripheral neuropathy, GERD, Herpes zoster, Hyperlipidemia, HTN, Obesity, Osteoarthritis, Sleep apnea, Cardioversion x2 in 2017, CABG 1996, bil TKA.    PT Comments    Pt pleasant and very eager to progress mobility. Pt able to increase distance today as well as perform bil LE HEP. Pt noted to have 5/5 bil LE strength except Right dorsiflexion 3/5 and bil quads 4/5. Pt educated for HEP as well as balance deficits. Pt able to perform Rhomberg, sharpened Rhomberg with assist to weight shift. Will continue to follow. Pt admits that he was not using his rollator during any of his falls in the last year despite awareness and education that he should.  HR 80-90, sats 96% on RA    Follow Up Recommendations  CIR;Supervision/Assistance - 24 hour     Equipment Recommendations       Recommendations for Other Services       Precautions / Restrictions Precautions Precautions: Fall    Mobility  Bed Mobility Overal bed mobility: Modified Independent             General bed mobility comments: use of rail   Transfers Overall transfer level: Needs assistance   Transfers: Sit to/from Stand Sit to Stand: Min guard         General transfer comment: cues for hand placement and safety with pt able to stand from bed and chair  Ambulation/Gait Ambulation/Gait assistance: Min assist Ambulation Distance (Feet): 120 Feet Assistive device: Rolling walker (2 wheeled) Gait Pattern/deviations: Step-through pattern;Decreased stride length;Trunk flexed   Gait velocity interpretation: Below normal speed for age/gender General Gait Details: cues for posture  and position in RW. Pt able to increase distance today but notes increased DOE with increased distance   Information systems manager Rankin (Stroke Patients Only) Modified Rankin (Stroke Patients Only) Pre-Morbid Rankin Score: Slight disability Modified Rankin: Moderately severe disability     Balance Overall balance assessment: Needs assistance;History of Falls   Sitting balance-Leahy Scale: Good Sitting balance - Comments: pt able to sit EOB 4 min without lean or assist today     Standing balance-Leahy Scale: Fair Standing balance comment: pt able to stand with wide BOS without UE support, with narrowed base able to maintain 30sec, UE support for any weight shifting     Tandem Stance - Right Leg: 0 Tandem Stance - Left Leg: 0 Rhomberg - Eyes Opened: 30 Rhomberg - Eyes Closed: 10   High Level Balance Comments: pt requires HHA to position feet into Rhomberg but can maintain once there            Cognition Arousal/Alertness: Awake/alert Behavior During Therapy: WFL for tasks assessed/performed   Area of Impairment: Safety/judgement                         Safety/Judgement: Decreased awareness of safety            Exercises General Exercises - Lower Extremity Long Arc Quad: AROM;Both;Seated;15 reps Toe Raises: AROM;Right;Seated;15 reps    General Comments        Pertinent Vitals/Pain Pain Assessment:  No/denies pain    Home Living                      Prior Function            PT Goals (current goals can now be found in the care plan section) Progress towards PT goals: Progressing toward goals    Frequency    Min 3X/week      PT Plan Current plan remains appropriate;Frequency needs to be updated    Co-evaluation             End of Session Equipment Utilized During Treatment: Gait belt Activity Tolerance: Patient tolerated treatment well Patient left: in chair;with call bell/phone  within reach;with chair alarm set Nurse Communication: Mobility status       Time: 1125-1150 PT Time Calculation (min) (ACUTE ONLY): 25 min  Charges:  $Gait Training: 8-22 mins $Neuromuscular Re-education: 8-22 mins                    G Codes:       Delaney Meigs, PT [phone]    Enedina Finner Mariavictoria Nottingham 06/30/2016, 12:36 PM

## 2016-07-01 DIAGNOSIS — N179 Acute kidney failure, unspecified: Secondary | ICD-10-CM

## 2016-07-01 LAB — ECHOCARDIOGRAM COMPLETE
E decel time: 194 msec
FS: 35 % (ref 28–44)
Height: 74 in
IVS/LV PW RATIO, ED: 0.78
LA ID, A-P, ES: 53 mm
LA diam end sys: 53 mm
LA diam index: 2.3 cm/m2
LA vol A4C: 98.9 ml
LA vol index: 44.8 mL/m2
LA vol: 103 mL
LV PW d: 16 mm — AB (ref 0.6–1.1)
LV dias vol index: 33 mL/m2
LV dias vol: 77 mL (ref 62–150)
LV sys vol index: 14 mL/m2
LV sys vol: 33 mL (ref 21–61)
LVOT SV: 50 mL
LVOT VTI: 12 cm
LVOT area: 4.15 cm2
LVOT diameter: 23 mm
LVOT peak vel: 58 cm/s
MV Dec: 194
MV Peak grad: 4 mmHg
MV pk E vel: 103 m/s
RV sys press: 42 mmHg
Reg peak vel: 291 cm/s
Simpson's disk: 57
Stroke v: 44 ml
TAPSE: 12.2 mm
TR max vel: 291 cm/s
Weight: 3520 oz

## 2016-07-01 LAB — GLUCOSE, CAPILLARY
Glucose-Capillary: 140 mg/dL — ABNORMAL HIGH (ref 65–99)
Glucose-Capillary: 179 mg/dL — ABNORMAL HIGH (ref 65–99)
Glucose-Capillary: 197 mg/dL — ABNORMAL HIGH (ref 65–99)
Glucose-Capillary: 196 mg/dL — ABNORMAL HIGH (ref 65–99)
Glucose-Capillary: 189 mg/dL — ABNORMAL HIGH (ref 65–99)

## 2016-07-01 NOTE — Progress Notes (Signed)
[facility] PROGRESS NOTE  Luis MONSANTO Yu:[id number] DOB: [birth year] DOA: 06/27/2016   PCP: Shirline Frees, NP     Assessment/Plan: Principal Problem:   Ischemic stroke of frontal lobe (HCC) Active Problems:   DM (diabetes mellitus) type II uncontrolled with retinopathy   OSA (obstructive sleep apnea)   Essential hypertension   Coronary atherosclerosis   Atrial fibrillation (HCC)   Nausea & vomiting   Thrombocytopenia (HCC)   Renal failure (ARF), acute on chronic (HCC)   Stroke (cerebrum) (HCC)   Acute CVA (cerebrovascular accident) (HCC)   Acute kidney injury (HCC)    71 y.o.malewith history of CAD status post CABG, chronic atrial fibrillation, hypertension, diabetes mellitus and sleep apnea was referred after a fall, and noted to have ischemic acute ischemic stroke per admitting MRI of the brain (acute ischemia involving the left frontal lobe)   #1Left frontal stroke: MRA brain negative, EEG negative 2-D echo 06/30/16-  moderate LVH.EF55%- 60%. Mild LAE/RAE, inc PASP of Carotid Doppler-Mild to moderate heterogeneous plaque in bilateral carotid c/w < 50% stenosis ,  A1c 7.6   LDL 50 On Eliquis and Aspirin on discharge.  Follow-up with Dr. Everlena Cooper at [facility] in 4 weeks.  #2 Acute on chronic renal failure stage III: Precipitated by nausea vomiting due to gastroenteritis on admission  baseline creatinine 1.15.   Hydration.  Hold ARB and hydrochlorothiazide.      #3Paroxysmal atrial fibrillation: Chads 2 vasc >2 Rate controlled   Cont Eliquis  #4 CAD status post CABG: stable Troponin negative 2  #5 Diabetes mellitus type 2: A1c 7.6 metformin and glipizide on hold due to acute renal failure.  Continue Lantus with sliding scale coverage.    #6Thrombocytopenia: Etiology unclear-? Medication related   DVT prophylaxsis eliquis  Code Status:  Full code      Family Communication:   Disposition Plan: CIR Monday      Consultants:  neurology  Procedures:  None   Antibiotics: Anti-infectives    None         HPI/Subjective: Patient appears to be comfortable, speech is intact but slow,No acute events noted overnight  Objective: Vitals:   06/30/16 2143 07/01/16 0103 07/01/16 0410 07/01/16 1335  BP: (!) 128/111 (!) 152/128 (!) 152/83 (!) 137/95  Pulse: 67 69 70 95  Resp: Temp: 98.2 F (36.8 C) 97.9 F (36.6 C) 98.1 F (36.7 C) 98 F (36.7 C)  TempSrc: Oral Oral Oral Oral  SpO2: 98% 98% 98% 95%  Weight:      Height:        Intake/Output Summary (Last 24 hours) at 07/01/16 1451 Last data filed at 07/01/16 0400  Gross per 24 hour  Intake                0 ml  Output              200 ml  Net             -200 ml    Exam:  Examination:  General exam: No acute distress Respiratory system: Clear to auscultation. Respiratory effort normal. Cardiovascular system: S1 & S2 heard, RRR. No JVD, murmurs, rubs, gallops or clicks. No pedal edema. Gastrointestinal system: Abdomen is nondistended, soft and nontender. No organomegaly or masses felt. Normal bowel sounds heard. Central nervous system: Alert and responsive  Extremities: No edema no cyanosis  Data Reviewed: I have personally reviewed following labs and imaging studies  Micro Results No results found for this or any previous visit (from the past 240 hour(s)).  Radiology Reports Dg Chest 2 View  Result Date: 06/27/2016 CLINICAL DATA:  Left-sided chest pain, cough, and chest congestion. Coronary artery disease. EXAM: CHEST  2 VIEW COMPARISON:  02/15/2016 FINDINGS: The heart size and mediastinal contours are within normal limits. Prior CABG. Both lungs are clear. The visualized skeletal structures are unremarkable. IMPRESSION: Stable exam.  No active cardiopulmonary disease. Electronically Signed   By: Myles Rosenthal M.D.   On: 06/27/2016 18:41   Dg Abd 1 View  Result Date: 06/28/2016 CLINICAL DATA:  Intractable  nausea, vomiting for 2 days EXAM: ABDOMEN - 1 VIEW COMPARISON:  CT 05/24/2016 FINDINGS: Nonobstructive bowel gas pattern. Moderate stool burden throughout the colon. No organomegaly or free air. No suspicious calcification. Degenerative changes in the hips and lower lumbar spine. IMPRESSION: Moderate stool burden in the colon. No evidence of obstruction or free air. Electronically Signed   By: Charlett Nose M.D.   On: 06/28/2016 10:41   Ct Head Wo Contrast  Result Date: 06/28/2016 CLINICAL DATA:  Patient fell on concrete hitting top of head. EXAM: CT HEAD WITHOUT CONTRAST TECHNIQUE: Contiguous axial images were obtained from the base of the skull through the vertex without intravenous contrast. COMPARISON:  None. FINDINGS: BRAIN: There mild is sulcal and ventricular prominence consistent with superficial and central atrophy. No intraparenchymal hemorrhage, mass effect nor midline shift. Periventricular and subcortical white matter hypodensities consistent with chronic small vessel ischemic disease are identified. No acute large vascular territory infarcts. No abnormal extra-axial fluid collections. Basal cisterns are not effaced and midline. VASCULAR: Moderate calcific atherosclerosis of the carotid siphons and both vertebral arteries. SKULL: No skull fracture. No significant scalp soft tissue swelling. SINUSES/ORBITS: The mastoid air-cells are clear. The included paranasal sinuses are well-aerated within the left maxillary sinus with partially visualized small mucous retention cystThe included ocular globes and orbital contents are non-suspicious. OTHER: None. IMPRESSION: 1. Chronic appearing small vessel ischemic disease of periventricular white matter with cerebral mild atrophy. 2. No acute intracranial abnormality. Electronically Signed   By: Tollie Eth M.D.   On: 06/28/2016 01:36   Ct Head Wo Contrast  Result Date: 06/27/2016 CLINICAL DATA:  Tripped over a step, hit top of his head, patient takes blood  thinner EXAM: CT HEAD WITHOUT CONTRAST TECHNIQUE: Contiguous axial images were obtained from the base of the skull through the vertex without intravenous contrast. COMPARISON:  05/24/2016 FINDINGS: Brain: No intracranial hemorrhage, mass effect or midline shift. No acute cortical infarction. Stable atrophy in chronic small vessel ischemic changes bilateral periventricular and patchy subcortical white matter. No mass lesion is noted on this unenhanced scan. Ventricular size is stable from prior exam. No intraventricular hemorrhage. Vascular: Atherosclerotic calcifications of carotid siphon again noted. Atherosclerotic calcifications bilateral vertebral arteries. Skull: There is no skull fracture. Sinuses/Orbits: Probable mucous retention cyst partially visualized left maxillary sinus measures 1.4 cm. Other: None IMPRESSION: No acute intracranial abnormality. Stable atrophy and chronic white matter small vessel ischemic changes. No definite acute cortical infarction. Electronically Signed   By: Natasha Mead M.D.   On: 06/27/2016 17:24   Ct Cervical Spine Wo Contrast  Result Date: 06/27/2016 CLINICAL DATA:  71 y/o M; status post fall with head injury. No neck complaints at this time. EXAM: CT CERVICAL SPINE WITHOUT CONTRAST TECHNIQUE: Multidetector CT imaging of the cervical spine was performed without intravenous contrast. Multiplanar CT image reconstructions were also generated. COMPARISON:  None. FINDINGS:  Alignment: Straightening of cervical lordosis with mild reversal at the C5-6 level. No listhesis. Slight clockwise rotation of C1 on C2 is likely positional. Skull base and vertebrae: No acute fracture. No primary bone lesion or focal pathologic process. Soft tissues and spinal canal: No prevertebral fluid or swelling. No visible canal hematoma. Disc levels: Moderate cervical spondylosis with with predominantly discogenic degenerative changes at the C6 through T1 levels and left-greater-than-right facet  degenerative changes. There are multiple levels of canal stenosis greatest at C6-7 where it is at least moderate. No high-grade bony foraminal narrowing. Extensive productive changes of the anterior C1-2 articulation. Upper chest: Negative. Other: Moderate calcific atherosclerosis of the carotid bifurcations bilaterally. Otherwise negative. IMPRESSION: 1. No acute fracture or dislocation is identified. 2. Moderate cervical spondylosis greatest at C6-7 where there is at least moderate canal stenosis. Electronically Signed   By: Mitzi Hansen M.D.   On: 06/27/2016 18:41   Mr Brain Wo Contrast  Result Date: 06/28/2016 CLINICAL DATA:  Ataxia and vomiting EXAM: MRI HEAD WITHOUT CONTRAST TECHNIQUE: Multiplanar, multiecho pulse sequences of the brain and surrounding structures were obtained without intravenous contrast. COMPARISON:  Head CT 06/28/2016 Brain MRI 02/16/2013 FINDINGS: Brain: There is a punctate focus of diffusion restriction within the posterior left frontal white matter, near the base of the left central sulcus. There is beginning confluent hyperintense T2-weighted signal within the periventricular white matter, most often seen in the setting of chronic microvascular ischemia. Old lacunar infarct in the superior left frontal white matter is unchanged. No mass lesion or midline shift. No hydrocephalus or extra-axial fluid collection. The midline structures are normal. No age advanced or lobar predominant atrophy. Vascular: Major intracranial arterial and venous sinus flow voids are preserved. No evidence of chronic microhemorrhage or amyloid angiopathy. Skull and upper cervical spine: The visualized skull base, calvarium, upper cervical spine and extracranial soft tissues are normal. Sinuses/Orbits: No fluid levels or advanced mucosal thickening. No mastoid effusion. Normal orbits. IMPRESSION: 1. Punctate focus of acute ischemia within the posterior left frontal white matter without hemorrhage  or mass effect. 2. Chronic microvascular ischemia and old lacunar infarcts, unchanged compared to the 02/16/2013. Electronically Signed   By: Deatra Robinson M.D.   On: 06/28/2016 06:02   US Renal  Result Date: 06/28/2016 CLINICAL DATA:  Acute kidney injury EXAM: RENAL / URINARY TRACT ULTRASOUND COMPLETE COMPARISON:  05/24/2016 CT abdomen/ pelvis. FINDINGS: Right Kidney: Length: 13.9 cm. Mildly echogenic right renal parenchyma, which is normal in thickness. No right hydronephrosis. Simple appearing exophytic 1.5 x 0.9 x 1.0 cm renal cyst in the medial upper right kidney. No additional right renal lesions. Left Kidney: Length: 13.7 cm. Mildly echogenic left renal parenchyma, which is normal in thickness. No left hydronephrosis. No left renal mass . Bladder: Appears normal for degree of bladder distention. IMPRESSION: 1. No hydronephrosis. 2. Mildly echogenic normal size kidneys, compatible with the provided history of nonspecific acute renal parenchymal disease. 3. Small simple right renal cyst . 4. Normal bladder. Electronically Signed   By: Delbert Phenix M.D.   On: 06/28/2016 10:43   Mr Maxine Glenn Head/brain YQ Cm  Result Date: 06/28/2016 CLINICAL DATA:  Infarct on brain MRI EXAM: MRA HEAD WITHOUT CONTRAST TECHNIQUE: Angiographic images of the Circle of Willis were obtained using MRA technique without intravenous contrast. COMPARISON:  Brain MRI from earlier today FINDINGS: Symmetric enhancement of the carotid and vertebral arteries at the skullbase. Mild asymmetry of carotid size, larger on the left, attributed to larger A1 on the  left.Mild left vertebral artery dominance. Standard vertebrobasilar branching. There is no major branch occlusion or flow limiting stenosis. Atherosclerotic irregularity bilateral carotid siphons with mild bilateral supraclinoid ICA narrowing, borderline moderate on the right. Posterior circulation vessels are smooth. Tiny inferior outpouchings from the bilateral supraclinoid ICA is  attributed to infundibula based on source images. No generalized vessel beading. IMPRESSION: 1. No acute finding. 2. Intracranial atherosclerotic changes with mild bilateral supraclinoid ICA narrowing. Electronically Signed   By: Marnee Spring M.D.   On: 06/28/2016 13:21     CBC  Recent Labs Lab 06/27/16 1807 06/28/16 0052 06/28/16 0718 06/29/16 0308  WBC 9.8 7.6 6.3 6.9  HGB 13.7 12.2* 12.1* 11.3*  HCT 43.4 37.9* 38.1* 35.0*  PLT 136* 124* 121* 101*  MCV 92.7 92.9 92.9 92.3  MCH 29.3 29.9 29.5 29.8  MCHC 31.6 32.2 31.8 32.3  RDW 15.7* 16.0* 16.1* 16.2*  LYMPHSABS 0.7 0.7 0.8  --   MONOABS 0.6 0.4 0.3  --   EOSABS 0.1 0.1 0.3  --   BASOSABS 0.0 0.0 0.0  --     Chemistries   Recent Labs Lab 06/27/16 1807 06/28/16 0052 06/28/16 0821 06/29/16 0308  NA 138 137 137 136  K 4.5 4.5 4.0 3.8  CL 102 104 104 107  CO2 GLUCOSE 177* 199* 165* 181*  BUN 30* 34* 33* 30*  CREATININE 1.77* 1.94* 1.61* 1.19  CALCIUM 8.7* 8.1* 8.0* 7.9*  AST ALT ALKPHOS 102 85 81 82  BILITOT 1.1 0.8 1.0 0.6   ------------------------------------------------------------------------------------------------------------------ estimated creatinine clearance is 70.8 mL/min (by C-G formula based on SCr of 1.19 mg/dL). ------------------------------------------------------------------------------------------------------------------ No results for input(s): HGBA1C in the last 72 hours. ------------------------------------------------------------------------------------------------------------------ No results for input(s): CHOL, HDL, LDLCALC, TRIG, CHOLHDL, LDLDIRECT in the last 72 hours. ------------------------------------------------------------------------------------------------------------------ No results for input(s): TSH, T4TOTAL, T3FREE, THYROIDAB in the last 72 hours.  Invalid input(s):  FREET3 ------------------------------------------------------------------------------------------------------------------ No results for input(s): VITAMINB12, FOLATE, FERRITIN, TIBC, IRON, RETICCTPCT in the last 72 hours.  Coagulation profile No results for input(s): INR, PROTIME in the last 168 hours.  No results for input(s): DDIMER in the last 72 hours.  Cardiac Enzymes  Recent Labs Lab 06/28/16 0308 06/28/16 0821  TROPONINI <0.03 <0.03   ------------------------------------------------------------------------------------------------------------------ Invalid input(s): POCBNP   CBG:  Recent Labs Lab 06/30/16 1639 06/30/16 2203 07/01/16 0646 07/01/16 0829 07/01/16 1152  GLUCAP 95 194* 140* 179* 197*       Studies: No results found.    Lab Results  Component Value Date   HGBA1C 7.6 (H) 06/28/2016   HGBA1C 6.9 04/24/2016   HGBA1C 7.0 01/10/2016   Lab Results  Component Value Date   MICROALBUR 1.8 06/23/2014   LDLCALC 50 06/28/2016   CREATININE 1.19 06/29/2016       Scheduled Meds: .  stroke: mapping our early stages of recovery book   Does not apply Once  . acetaminophen  1,000 mg Oral Once  . apixaban  5 mg Oral BID  . aspirin EC  81 mg Oral Daily  . atorvastatin  80 mg Oral Daily  . buPROPion  150 mg Oral BID  . citalopram  20 mg Oral Daily  . cyanocobalamin  1,000 mcg Intramuscular q morning - 10a  . insulin aspart  0-9 Units Subcutaneous TID WC  . insulin aspart  10 Units Subcutaneous TID WC  . insulin glargine  20 Units Subcutaneous QHS  . isosorbide mononitrate  30 mg Oral Daily  . nebivolol  20 mg Oral Daily  . pantoprazole  40 mg Oral Daily  . thiamine  100 mg Oral Daily   Continuous Infusions:   LOS: 3 days    Time spent: >30 MINS    OSEI-BONSU,Jewelz Ricklefs  [facility] Pager [phone]. If 7PM-7AM, please contact night-coverage at [email or web address], password [facility] 07/01/2016, 2:51 PM  LOS: 3 days

## 2016-07-02 ENCOUNTER — Encounter (HOSPITAL_COMMUNITY): Payer: Self-pay | Admitting: *Deleted

## 2016-07-02 ENCOUNTER — Inpatient Hospital Stay (HOSPITAL_COMMUNITY)
Admission: RE | Admit: 2016-07-02 | Discharge: 2016-07-07 | DRG: 092 | Disposition: A | Discharge status: Home / Self Care | Payer: Medicare Other | Source: Intra-hospital | Attending: Physical Medicine & Rehabilitation | Admitting: Physical Medicine & Rehabilitation

## 2016-07-02 DIAGNOSIS — E785 Hyperlipidemia, unspecified: Secondary | ICD-10-CM

## 2016-07-02 DIAGNOSIS — F4323 Adjustment disorder with mixed anxiety and depressed mood: Secondary | ICD-10-CM | POA: Diagnosis present

## 2016-07-02 DIAGNOSIS — B0229 Other postherpetic nervous system involvement: Secondary | ICD-10-CM | POA: Diagnosis present

## 2016-07-02 DIAGNOSIS — Z79899 Other long term (current) drug therapy: Secondary | ICD-10-CM | POA: Diagnosis not present

## 2016-07-02 DIAGNOSIS — I69393 Ataxia following cerebral infarction: Secondary | ICD-10-CM

## 2016-07-02 DIAGNOSIS — F0390 Unspecified dementia without behavioral disturbance: Secondary | ICD-10-CM | POA: Diagnosis present

## 2016-07-02 DIAGNOSIS — R269 Unspecified abnormalities of gait and mobility: Secondary | ICD-10-CM

## 2016-07-02 DIAGNOSIS — E669 Obesity, unspecified: Secondary | ICD-10-CM | POA: Diagnosis present

## 2016-07-02 DIAGNOSIS — Z96653 Presence of artificial knee joint, bilateral: Secondary | ICD-10-CM | POA: Diagnosis present

## 2016-07-02 DIAGNOSIS — Z951 Presence of aortocoronary bypass graft: Secondary | ICD-10-CM

## 2016-07-02 DIAGNOSIS — E1142 Type 2 diabetes mellitus with diabetic polyneuropathy: Secondary | ICD-10-CM | POA: Diagnosis present

## 2016-07-02 DIAGNOSIS — Z7982 Long term (current) use of aspirin: Secondary | ICD-10-CM

## 2016-07-02 DIAGNOSIS — E1165 Type 2 diabetes mellitus with hyperglycemia: Secondary | ICD-10-CM

## 2016-07-02 DIAGNOSIS — Z8249 Family history of ischemic heart disease and other diseases of the circulatory system: Secondary | ICD-10-CM

## 2016-07-02 DIAGNOSIS — Z794 Long term (current) use of insulin: Secondary | ICD-10-CM

## 2016-07-02 DIAGNOSIS — E11311 Type 2 diabetes mellitus with unspecified diabetic retinopathy with macular edema: Secondary | ICD-10-CM

## 2016-07-02 DIAGNOSIS — K219 Gastro-esophageal reflux disease without esophagitis: Secondary | ICD-10-CM | POA: Diagnosis present

## 2016-07-02 DIAGNOSIS — Z7901 Long term (current) use of anticoagulants: Secondary | ICD-10-CM | POA: Diagnosis not present

## 2016-07-02 DIAGNOSIS — Z823 Family history of stroke: Secondary | ICD-10-CM | POA: Diagnosis not present

## 2016-07-02 DIAGNOSIS — I2510 Atherosclerotic heart disease of native coronary artery without angina pectoris: Secondary | ICD-10-CM | POA: Diagnosis present

## 2016-07-02 DIAGNOSIS — I10 Essential (primary) hypertension: Secondary | ICD-10-CM

## 2016-07-02 DIAGNOSIS — E1140 Type 2 diabetes mellitus with diabetic neuropathy, unspecified: Secondary | ICD-10-CM

## 2016-07-02 DIAGNOSIS — R2689 Other abnormalities of gait and mobility: Principal | ICD-10-CM | POA: Diagnosis present

## 2016-07-02 DIAGNOSIS — Z760 Encounter for issue of repeat prescription: Secondary | ICD-10-CM

## 2016-07-02 DIAGNOSIS — B029 Zoster without complications: Secondary | ICD-10-CM | POA: Diagnosis present

## 2016-07-02 DIAGNOSIS — Z23 Encounter for immunization: Secondary | ICD-10-CM

## 2016-07-02 DIAGNOSIS — F329 Major depressive disorder, single episode, unspecified: Secondary | ICD-10-CM | POA: Diagnosis present

## 2016-07-02 DIAGNOSIS — I69314 Frontal lobe and executive function deficit following cerebral infarction: Secondary | ICD-10-CM

## 2016-07-02 DIAGNOSIS — F339 Major depressive disorder, recurrent, unspecified: Secondary | ICD-10-CM

## 2016-07-02 DIAGNOSIS — I482 Chronic atrial fibrillation: Secondary | ICD-10-CM | POA: Diagnosis present

## 2016-07-02 DIAGNOSIS — I69398 Other sequelae of cerebral infarction: Secondary | ICD-10-CM | POA: Diagnosis not present

## 2016-07-02 DIAGNOSIS — G4730 Sleep apnea, unspecified: Secondary | ICD-10-CM | POA: Diagnosis present

## 2016-07-02 DIAGNOSIS — I25810 Atherosclerosis of coronary artery bypass graft(s) without angina pectoris: Secondary | ICD-10-CM

## 2016-07-02 LAB — GLUCOSE, CAPILLARY
Glucose-Capillary: 123 mg/dL — ABNORMAL HIGH (ref 65–99)
Glucose-Capillary: 187 mg/dL — ABNORMAL HIGH (ref 65–99)
Glucose-Capillary: 170 mg/dL — ABNORMAL HIGH (ref 65–99)
Glucose-Capillary: 159 mg/dL — ABNORMAL HIGH (ref 65–99)

## 2016-07-02 LAB — BASIC METABOLIC PANEL
Anion gap: 9 (ref 5–15)
BUN: 16 mg/dL (ref 6–20)
CO2: 28 mmol/L (ref 22–32)
Calcium: 8 mg/dL — ABNORMAL LOW (ref 8.9–10.3)
Chloride: 102 mmol/L (ref 101–111)
Creatinine, Ser: 1.02 mg/dL (ref 0.61–1.24)
GFR calc Af Amer: >60 mL/min (ref >60)
GFR calc non Af Amer: >60 mL/min (ref >60)
Glucose, Bld: 148 mg/dL — ABNORMAL HIGH (ref 65–99)
Potassium: 3.5 mmol/L (ref 3.5–5.1)
Sodium: 139 mmol/L (ref 135–145)

## 2016-07-02 LAB — CBC
HCT: 33.4 % — ABNORMAL LOW (ref 39.0–52.0)
Hemoglobin: 10.7 g/dL — ABNORMAL LOW (ref 13.0–17.0)
MCH: 29.1 pg (ref 26.0–34.0)
MCHC: 32 g/dL (ref 30.0–36.0)
MCV: 90.8 fL (ref 78.0–100.0)
Platelets: 111 10*3/uL — ABNORMAL LOW (ref 150–400)
RBC: 3.68 MIL/uL — ABNORMAL LOW (ref 4.22–5.81)
RDW: 15.4 % (ref 11.5–15.5)
WBC: 6.7 10*3/uL (ref 4.0–10.5)

## 2016-07-02 MED ORDER — GLUCERNA PO LIQD
1.0000 | Freq: Two times a day (BID) | ORAL | Status: DC
  Administered 2016-07-04 – 2016-07-06 (×6): 1 via ORAL
  Filled 2016-07-02 (×13): qty 237

## 2016-07-02 MED ORDER — INSULIN GLARGINE 100 UNIT/ML ~~LOC~~ SOLN
20.0000 [IU] | Freq: Every day | SUBCUTANEOUS | Status: DC
  Administered 2016-07-02 – 2016-07-04 (×3): 20 [IU] via SUBCUTANEOUS
  Filled 2016-07-02 (×4): qty 0.2

## 2016-07-02 MED ORDER — ONDANSETRON HCL 4 MG PO TABS: 4.0000 mg | ORAL_TABLET | Freq: Four times a day (QID) | ORAL | Status: DC | PRN

## 2016-07-02 MED ORDER — ISOSORBIDE MONONITRATE ER 30 MG PO TB24
30.0000 mg | ORAL_TABLET | Freq: Every day | ORAL | Status: DC
  Administered 2016-07-03 – 2016-07-07 (×5): 30 mg via ORAL
  Filled 2016-07-02 (×5): qty 1

## 2016-07-02 MED ORDER — ACETAMINOPHEN 325 MG PO TABS
650.0000 mg | ORAL_TABLET | ORAL | Status: DC | PRN
  Administered 2016-07-03 – 2016-07-06 (×6): 650 mg via ORAL
  Filled 2016-07-02 (×6): qty 2

## 2016-07-02 MED ORDER — VITAMIN B-1 100 MG PO TABS
100.0000 mg | ORAL_TABLET | Freq: Every day | ORAL | Status: DC
  Administered 2016-07-03 – 2016-07-07 (×5): 100 mg via ORAL
  Filled 2016-07-02 (×5): qty 1

## 2016-07-02 MED ORDER — PANTOPRAZOLE SODIUM 40 MG PO TBEC
40.0000 mg | DELAYED_RELEASE_TABLET | Freq: Every day | ORAL | Status: DC
  Administered 2016-07-03 – 2016-07-07 (×5): 40 mg via ORAL
  Filled 2016-07-02 (×5): qty 1

## 2016-07-02 MED ORDER — THIAMINE HCL 100 MG PO TABS: 100.0000 mg | ORAL_TABLET | Freq: Every day | ORAL | 1 refills | Status: DC

## 2016-07-02 MED ORDER — ONDANSETRON HCL 4 MG/2ML IJ SOLN: 4.0000 mg | Freq: Four times a day (QID) | INTRAMUSCULAR | Status: DC | PRN

## 2016-07-02 MED ORDER — ATORVASTATIN CALCIUM 80 MG PO TABS
80.0000 mg | ORAL_TABLET | Freq: Every day | ORAL | Status: DC
  Administered 2016-07-03 – 2016-07-06 (×4): 80 mg via ORAL
  Filled 2016-07-02 (×4): qty 1

## 2016-07-02 MED ORDER — VALSARTAN-HYDROCHLOROTHIAZIDE 80-12.5 MG PO TABS: 1.0000 | ORAL_TABLET | Freq: Every day | ORAL | 3 refills | Status: DC

## 2016-07-02 MED ORDER — ASPIRIN EC 81 MG PO TBEC
81.0000 mg | DELAYED_RELEASE_TABLET | Freq: Every day | ORAL | Status: DC
  Administered 2016-07-03 – 2016-07-07 (×5): 81 mg via ORAL
  Filled 2016-07-02 (×5): qty 1

## 2016-07-02 MED ORDER — BUPROPION HCL ER (SR) 150 MG PO TB12
150.0000 mg | ORAL_TABLET | Freq: Two times a day (BID) | ORAL | Status: DC
  Administered 2016-07-02 – 2016-07-07 (×10): 150 mg via ORAL
  Filled 2016-07-02 (×10): qty 1

## 2016-07-02 MED ORDER — INSULIN ASPART 100 UNIT/ML ~~LOC~~ SOLN
0.0000 [IU] | Freq: Three times a day (TID) | SUBCUTANEOUS | Status: DC
  Administered 2016-07-02 – 2016-07-04 (×4): 2 [IU] via SUBCUTANEOUS
  Administered 2016-07-04: 9 [IU] via SUBCUTANEOUS
  Administered 2016-07-04: 1 [IU] via SUBCUTANEOUS
  Administered 2016-07-05 – 2016-07-07 (×7): 2 [IU] via SUBCUTANEOUS

## 2016-07-02 MED ORDER — NEBIVOLOL HCL 5 MG PO TABS: 5.0000 mg | ORAL_TABLET | Freq: Every day | ORAL | 2 refills | Status: DC

## 2016-07-02 MED ORDER — PANTOPRAZOLE SODIUM 40 MG PO TBEC: 40.0000 mg | DELAYED_RELEASE_TABLET | Freq: Every day | ORAL | 1 refills | Status: DC

## 2016-07-02 MED ORDER — VITAMIN B-12 1000 MCG PO TABS: 2000.0000 ug | ORAL_TABLET | Freq: Every day | ORAL | 1 refills | Status: DC

## 2016-07-02 MED ORDER — APIXABAN 5 MG PO TABS
5.0000 mg | ORAL_TABLET | Freq: Two times a day (BID) | ORAL | Status: DC
  Administered 2016-07-02 – 2016-07-07 (×10): 5 mg via ORAL
  Filled 2016-07-02 (×10): qty 1

## 2016-07-02 MED ORDER — ACETAMINOPHEN 650 MG RE SUPP: 650.0000 mg | RECTAL | Status: DC | PRN

## 2016-07-02 MED ORDER — ACETAMINOPHEN 160 MG/5ML PO SOLN: 650.0000 mg | ORAL | Status: DC | PRN

## 2016-07-02 MED ORDER — NEBIVOLOL HCL 10 MG PO TABS: 10.0000 mg | ORAL_TABLET | Freq: Every day | ORAL | 2 refills | Status: DC

## 2016-07-02 MED ORDER — SORBITOL 70 % SOLN: 30.0000 mL | Freq: Every day | Status: DC | PRN

## 2016-07-02 MED ORDER — POTASSIUM CHLORIDE CRYS ER 20 MEQ PO TBCR
40.0000 meq | EXTENDED_RELEASE_TABLET | Freq: Once | ORAL | Status: AC
  Administered 2016-07-02: 40 meq via ORAL
  Filled 2016-07-02: qty 2

## 2016-07-02 MED ORDER — INSULIN ASPART 100 UNIT/ML ~~LOC~~ SOLN: SUBCUTANEOUS | 11 refills | Status: DC

## 2016-07-02 MED ORDER — GLIPIZIDE 5 MG PO TABS: 5.0000 mg | ORAL_TABLET | Freq: Two times a day (BID) | ORAL | 2 refills | Status: DC

## 2016-07-02 MED ORDER — NEBIVOLOL HCL 10 MG PO TABS
20.0000 mg | ORAL_TABLET | Freq: Every day | ORAL | Status: DC
  Administered 2016-07-03 – 2016-07-06 (×5): 20 mg via ORAL
  Filled 2016-07-02 (×5): qty 2

## 2016-07-02 MED ORDER — CITALOPRAM HYDROBROMIDE 20 MG PO TABS
20.0000 mg | ORAL_TABLET | Freq: Every day | ORAL | Status: DC
  Administered 2016-07-03 – 2016-07-06 (×5): 20 mg via ORAL
  Filled 2016-07-02 (×5): qty 1

## 2016-07-02 NOTE — Progress Notes (Signed)
Occupational Therapy Treatment Patient Details Name: Luis Yu MRN: [id number] DOB: Jan 24, 1946 Today's Date: 07/02/2016    History of present illness Pt is a 71 y.o. male presenting after fall and persistent nausea and vomiting. MRI on 4/12 + for acute ischemia involving the L frontal lobe. PMHx: Atrial flutter, CAD, Depression, DM2, Diabetic peripheral neuropathy, GERD, Herpes zoster, Hyperlipidemia, HTN, Obesity, Osteoarthritis, Sleep apnea, Cardioversion x2 in 2017, CABG 1996, bil TKA.   OT comments  Pt making improvements in his adls and adl mobility.  Pt continues to remain unsteady on his feet when doing adls and when ambulating from one task to another.  Pt is easily distracted during evaluation which makes him a fall risk.  When sitting, pt has increased his independence with adls but he does forget to call for help when getting up; therefore a chair alarm.  Pt excited about transferring to rehab.  Follow Up Recommendations  CIR;Supervision/Assistance - 24 hour    Equipment Recommendations  Other (comment)    Recommendations for Other Services      Precautions / Restrictions Precautions Precautions: Fall Restrictions Weight Bearing Restrictions: No       Mobility Bed Mobility Overal bed mobility: Modified Independent                Transfers Overall transfer level: Needs assistance Equipment used: Rolling walker (2 wheeled) Transfers: Sit to/from Stand Sit to Stand: Min guard         General transfer comment: cues for hand placement and safety with pt able to stand from bed and chair    Balance Overall balance assessment: Needs assistance;History of Falls Sitting-balance support: Feet supported;No upper extremity supported Sitting balance-Leahy Scale: Good     Standing balance support: Bilateral upper extremity supported;During functional activity Standing balance-Leahy Scale: Fair Standing balance comment: Pt able to stand some at the sink  unsupported while combing his hair.  Feel better if he has a solid surface like a sink in front of him.                             ADL either performed or assessed with clinical judgement   ADL Overall ADL's : Needs assistance/impaired     Grooming: Wash/dry face;Wash/dry hands;Brushing hair;Oral care;Minimal assistance;Standing;Cueing for safety Grooming Details (indicate cue type and reason): Pt stood at sink for appx 10 minutes with one 3 minute rest break to complete all grooming.  Pt had occasional swaying with min assist to correct balance.           Upper Body Dressing : Minimal assistance;Sitting   Lower Body Dressing: Minimal assistance;Sit to/from stand;Cueing for safety Lower Body Dressing Details (indicate cue type and reason): Pt able to donn pants and socks with min assist to stand and pull pants up.  Pt also requires assist managing zippers and snaps because he loses balance when he lets go of the walker with both hands.   Toilet Transfer: Minimal assistance;Ambulation;BSC;Regular Toilet;RW Toilet Transfer Details (indicate cue type and reason): Pt walked to bathroom with 3:1 over commode.  Pt is impulsive with his movement. Toileting- Architect and Hygiene: Min guard;Sit to/from stand       Functional mobility during ADLs: Minimal assistance;Rolling walker General ADL Comments: Pt continues to have balance deficits in standing.  Pt acknowledges the deficits but when asked if it is safe for him to get up alone, he always answers yes.  Reviewed with pt at  length the need to call for help to get up.  Pt did ask if he would have "all these alarms" on his chairs adn bed on the rehab unit.  Explained to him that his therapy team would decide when they feel it is safe for him to transfer on his own and he is free to ask them at any time if he is improving with his transfer safety.     Vision       Perception     Praxis      Cognition  Arousal/Alertness: Awake/alert Behavior During Therapy: WFL for tasks assessed/performed Overall Cognitive Status: Impaired/Different from baseline Area of Impairment: Memory;Safety/judgement                     Memory: Decreased short-term memory   Safety/Judgement: Decreased awareness of safety;Decreased awareness of deficits   Problem Solving: Requires verbal cues          Exercises     Shoulder Instructions       General Comments Pt requires safety cues to be repeated many times.    Pertinent Vitals/ Pain       Pain Assessment: No/denies pain  Home Living                                          Prior Functioning/Environment              Frequency  Min 3X/week        Progress Toward Goals  OT Goals(current goals can now be found in the care plan section)  Progress towards OT goals: Progressing toward goals  Acute Rehab OT Goals Patient Stated Goal: to go to rehab  OT Goal Formulation: With patient Time For Goal Achievement: 07/12/16 Potential to Achieve Goals: Good ADL Goals Pt Will Perform Grooming: with min guard assist;standing Pt Will Perform Upper Body Bathing: with supervision;sitting Pt Will Perform Lower Body Bathing: with min guard assist;sit to/from stand Pt Will Transfer to Toilet: with min assist;ambulating;bedside commode Pt Will Perform Toileting - Clothing Manipulation and hygiene: with min guard assist;sit to/from stand Pt/caregiver will Perform Home Exercise Program: Increased strength;Both right and left upper extremity;With theraband;With written HEP provided  Plan Discharge plan remains appropriate    Co-evaluation                 End of Session Equipment Utilized During Treatment: Gait belt;Rolling walker  OT Visit Diagnosis: Unsteadiness on feet (R26.81);Other abnormalities of gait and mobility (R26.89);Repeated falls (R29.6);Muscle weakness (generalized) (M62.81);History of falling  (Z91.81);Dizziness and giddiness (R42)   Activity Tolerance Patient tolerated treatment well;Patient limited by fatigue   Patient Left in bed;with call bell/phone within reach;with bed alarm set   Nurse Communication Mobility status        Time: 1610-9604 OT Time Calculation (min): 25 min  Charges:    [name], OTR/L [phone]   [name] 07/02/2016, 11:13 AM

## 2016-07-02 NOTE — Progress Notes (Signed)
Patient ID: Luis Yu, male   DOB: 1945/08/30, 71 y.o.   MRN: [id number] Patient arrived from 2W with RN, family and patient belongings. Oriented to room, rehab process, rehab schedule, fall prevention plan, rehab safety plan, and nurse call system. Patient resting comfortably in bed with no complaints of pain with family at bedside.

## 2016-07-02 NOTE — Progress Notes (Signed)
Ranelle Oyster, MD Physician Signed Physical Medicine and Rehabilitation  Consult Note Date of Service: 06/29/2016 5:59 AM  Related encounter: ED to Hosp-Admission (Current) from 06/27/2016 in [facility] CARDIAC UNIT     Expand All Collapse All   Hide copied text Hover for attribution information      Physical Medicine and Rehabilitation Consult Reason for Consult: Nausea vomiting with ataxic gait. Referring Physician: [facility]   HPI: Luis Yu is a 71 y.o. right handed male with history of bilateral TKA, CAD status post CABG, chronic atrial fibrillation maintained on aspirin and Eliquis, hypertension, diabetes mellitus. Per chart review patient lives with spouse. One level home 3 steps to entry. He was independent with a walker prior to admission. Presented 06/28/2016 after being referred to the ER by primary care physician after a fall while at the office with persistent nausea and vomiting. In the ER CT of the head and neck was unremarkable was planned discharge to home. While waiting outside for his wife to pick him up patient with ongoing nausea and vomiting a repeat CT of the head was again negative. Blood pressure 130/110. MRI of the brain showed punctate focus of acute ischemia within the posterior left frontal white matter without hemorrhage or mass effect. Chronic microvascular ischemia and old lacunar infarcts. MRA was unremarkable. Carotid Dopplers with mild to moderate heterogeneous plaque in bilateral carotid artery suggestive of less than 50% stenosis. Echocardiogram pending. Neurology follow-up patient remains on aspirin and Eliquis as prior to admission. Tolerating a regular consistency diet.   Review of Systems  Constitutional: Negative for chills and fever.  HENT: Negative for hearing loss.   Eyes: Negative for blurred vision and double vision.  Respiratory: Negative for cough and shortness of breath.   Cardiovascular: Positive for  palpitations. Negative for chest pain and leg swelling.  Gastrointestinal: Positive for constipation. Negative for nausea and vomiting.       GERD  Genitourinary: Positive for urgency. Negative for dysuria and hematuria.  Musculoskeletal: Positive for falls, joint pain and myalgias.  Skin: Negative for rash.  Neurological: Negative for seizures.  Psychiatric/Behavioral: Positive for depression.  All other systems reviewed and are negative.      Past Medical History:  Diagnosis Date  . Adjustment disorder with mixed emotional features   . Atrial flutter (HCC)   . CAD (coronary artery disease)   . Depression   . Diabetes mellitus type II, uncontrolled (HCC)   . Diabetic peripheral neuropathy (HCC)   . Erectile dysfunction   . GERD (gastroesophageal reflux disease)   . Herpes zoster   . Hyperlipidemia   . Hypertension   . Obesity   . Osteoarthritis   . Postherpetic neuralgia   . Sleep apnea         Past Surgical History:  Procedure Laterality Date  . CARDIOVERSION N/A 11/10/2015   Procedure: CARDIOVERSION;  Surgeon: Laurey Morale, MD;  Location: [facility] ENDOSCOPY;  Service: Cardiovascular;  Laterality: N/A;  . CARDIOVERSION N/A 02/21/2016   Procedure: CARDIOVERSION;  Surgeon: Lewayne Bunting, MD;  Location: [facility] ENDOSCOPY;  Service: Cardiovascular;  Laterality: N/A;  . CORONARY ARTERY BYPASS GRAFT  1996   eith a LIMA to the LAD, saphenous vein graft to the acute marginal, saphenous vein graft to the PDA and saphenous vein graft to the circumflex.  [name] KNEE ARTHROSCOPY  05/2004   right knee  . REPLACEMENT TOTAL KNEE BILATERAL    . SHOULDER SURGERY  Family History  Problem Relation Age of Onset  . Coronary artery disease Mother     deceased at 24  . Coronary artery disease Father     died age 22  . Stroke      half brother  . Stroke Brother    Social History:  reports that he has never smoked. He has never used smokeless  tobacco. He reports that he drinks about 1.2 oz of alcohol per week . He reports that he does not use drugs. Allergies: No Known Allergies          Facility-Administered Medications Prior to Admission  Medication Dose Route Frequency Provider Last Rate Last Dose  . pneumococcal 13-valent conjugate vaccine (PREVNAR 13) injection 0.5 mL  0.5 mL Intramuscular Tomorrow-1000 Shirline Frees, NP             Medications Prior to Admission  Medication Sig Dispense Refill  . acetaminophen (TYLENOL) 500 MG tablet Take 2 tablets (1,000 mg total) by mouth every 6 (six) hours as needed. (Patient taking differently: Take 1,000 mg by mouth every 6 (six) hours as needed for mild pain. ) 30 tablet 0  . apixaban (ELIQUIS) 5 MG TABS tablet Take 1 tablet (5 mg total) by mouth 2 (two) times daily. 60 tablet 11  . aspirin EC 81 MG tablet Take 1 tablet (81 mg total) by mouth daily.    [name] atorvastatin (LIPITOR) 80 MG tablet TAKE 1 TABLET (80 MG TOTAL) BY MOUTH DAILY. 90 tablet 3  . buPROPion (WELLBUTRIN SR) 150 MG 12 hr tablet Take 1 tablet (150 mg total) by mouth 2 (two) times daily. 180 tablet 1  . BYSTOLIC 20 MG TABS TAKE 1 TABLET (20 MG TOTAL) BY MOUTH DAILY. 90 tablet 3  . citalopram (CELEXA) 20 MG tablet Take 1 tablet (20 mg total) by mouth daily. 90 tablet 1  . glipiZIDE (GLUCOTROL) 10 MG tablet Take 1 tablet (10 mg total) by mouth 2 (two) times daily before a meal. (Patient taking differently: Take 10 mg by mouth daily before breakfast. ) 180 tablet 3  . glucose blood (ACCU-CHEK ACTIVE STRIPS) test strip Use as instructed 100 each 12  . insulin aspart (NOVOLOG FLEXPEN) 100 UNIT/ML FlexPen USE 15 TO 30 UNITS 15 MINUTES BEFORE MEALS 3 TIMES DAILY 15 pen 6  . Insulin Glargine (BASAGLAR KWIKPEN) 100 UNIT/ML SOPN Inject 0.2 mLs (20 Units total) into the skin at bedtime. 3 pen 11  . Insulin Pen Needle (PEN NEEDLES 31GX5/16") 31G X 8 MM MISC Use to inject insulin 5 times a day. 100 each 11  . isosorbide mononitrate  (IMDUR) 30 MG 24 hr tablet Take 1 tablet (30 mg total) by mouth daily. 30 tablet 12  . Lancets (ACCU-CHEK MULTICLIX) lancets Use up to 5 times a day to check blood sugar. 100 each 5  . metFORMIN (GLUCOPHAGE) 1000 MG tablet Take 1 tablet (1,000 mg total) by mouth 2 (two) times daily with a meal. 180 tablet 3  . omeprazole (PRILOSEC) 20 MG capsule TAKE ONE CAPSULE TWICE A DAY BEFORE A MEAL 180 capsule 0  . ondansetron (ZOFRAN) 4 MG tablet Take 1 tablet (4 mg total) by mouth every 6 (six) hours as needed for nausea or vomiting. 12 tablet 0  . valsartan-hydrochlorothiazide (DIOVAN-HCT) 80-12.5 MG tablet Take 1 tablet by mouth daily. 90 tablet 3    Home: Home Living Family/patient expects to be discharged to:: Private residence Living Arrangements: Spouse/significant other Available Help at Discharge: Family, Available 24 hours/day  Type of Home: House Home Access: Stairs to enter [employer] of Steps: 3 Home Layout: One level Bathroom Shower/Tub: Health visitor: Handicapped height Home Equipment: Environmental consultant - 4 wheels, Shower seat  Functional History: Prior Function Level of Independence: Independent with assistive device(s) Comments: rollator for all mobility. Independent with BADL; recently wife has been needing to assist minimally when pt fatigues Functional Status:  Mobility: Bed Mobility Overal bed mobility: Needs Assistance Bed Mobility: Sit to Supine Sit to supine: Min assist General bed mobility comments: Min assist for controlled descent of trunk to supine. Pt moves quickly and is off balance with movement. Transfers Overall transfer level: Needs assistance Equipment used: Rolling walker (2 wheeled) Transfers: Sit to/from Stand Sit to Stand: Min assist General transfer comment: Min assist for balance in standing.  ADL: ADL Overall ADL's : Needs assistance/impaired Eating/Feeding: Set up, Sitting Grooming: Minimal assistance, Sitting Upper Body  Bathing: Moderate assistance, Sitting Lower Body Bathing: Maximal assistance, Sit to/from stand Upper Body Dressing : Moderate assistance, Sitting Lower Body Dressing: Maximal assistance, Sit to/from stand Lower Body Dressing Details (indicate cue type and reason): When attempting to adjust socks sitting EOB pt with strong posterior lean/falling posteriorly. General ADL Comments: Pt tolerated standing x2 for ~1 minute each; then quickly sits back down reporting dizziness and fatigue. Pt with SOB with minimal activity; BP 130/110, HR 113.  Pt reports he noticies that he leans/falls backwards often; 5-6 falls in the past 6 months.  Cognition: Cognition Overall Cognitive Status: No family/caregiver present to determine baseline cognitive functioning Orientation Level: Oriented X4 Cognition Arousal/Alertness: Awake/alert Behavior During Therapy: Impulsive Overall Cognitive Status: No family/caregiver present to determine baseline cognitive functioning Area of Impairment: Following commands, Safety/judgement, Problem solving Following Commands: Follows one step commands consistently, Follows one step commands with increased time Safety/Judgement: Decreased awareness of safety Problem Solving: Requires verbal cues, Requires tactile cues  Blood pressure 117/82, pulse 64, temperature 98.3 F (36.8 C), temperature source Oral, resp. rate 18, height  (1.88 m), weight 99.8 kg (220 lb), SpO2 97 %. Physical Exam  Constitutional: He appears well-developed.  HENT:  Head: Normocephalic.  Eyes: EOM are normal.  Neck: Normal range of motion. Neck supple. No thyromegaly present.  Cardiovascular:  Cardiac rate control  Respiratory: Effort normal and breath sounds normal. No respiratory distress.  GI: Soft. Bowel sounds are normal. He exhibits no distension.  Neurological: He is alert.  Patient is a bit anxious. Made good eye contact with examiner. He was able to provide his name and age. Fair  awareness of deficits. Follows simple commands. Halting speech. 4 limb ataxia with right side more affected than left with FTN,HTS. Strength grossly 4 to 4+/5 all 4 limbs. Senses pain and LT in all 4 limbs.   Skin: Skin is warm and dry.  Psychiatric: He has a normal mood and affect. His behavior is normal.        Assessment/Plan: Diagnosis: Left frontal infarct, pre-existing gait disorder with ataxia? 1. Does the need for close, 24 hr/day medical supervision in concert with the patient's rehab needs make it unreasonable for this patient to be served in a less intensive setting? Yes 2. Co-Morbidities requiring supervision/potential complications: afib, htn, ucontrolled DM,  3. Due to bladder management, bowel management, safety, skin/wound care, disease management, medication administration, pain management and patient education, does the patient require 24 hr/day rehab nursing? Yes 4. Does the patient require coordinated care of a physician, rehab nurse, PT (1-2 hrs/day, 5 days/week), OT (  1-2 hrs/day, 5 days/week) and SLP (1-2 hrs/day, 5 days/week) to address physical and functional deficits in the context of the above medical diagnosis(es)? Yes Addressing deficits in the following areas: balance, endurance, locomotion, strength, transferring, bowel/bladder control, bathing, dressing, feeding, grooming, toileting and psychosocial support 5. Can the patient actively participate in an intensive therapy program of at least 3 hrs of therapy per day at least 5 days per week? Yes 6. The potential for patient to make measurable gains while on inpatient rehab is excellent 7. Anticipated functional outcomes upon discharge from inpatient rehab are modified independent and supervision  with PT, modified independent and supervision with OT, modified independent with SLP. 8. Estimated rehab length of stay to reach the above functional goals is: potentially 14-20 days 9. Does the patient have adequate social  supports and living environment to accommodate these discharge functional goals? Yes 10. Anticipated D/C setting: Home 11. Anticipated post D/C treatments: HH therapy and Outpatient therapy 12. Overall Rehab/Functional Prognosis: excellent  RECOMMENDATIONS: This patient's condition is appropriate for continued rehabilitative care in the following setting: CIR Patient has agreed to participate in recommended program. Yes Note that insurance prior authorization may be required for reimbursement for recommended care.  Comment: Pt was admitted yesterday. Although his symptoms are more pronounced on the right side, it appears that he's been having balance issues for awhile now. Await therapy input and further neuro work up. Rehab Admissions Coordinator to follow up.  Thanks,  Ranelle Oyster, MD, [place] [facility]    Charlton Amor., PA-C 06/29/2016    Revision History                        Routing History

## 2016-07-02 NOTE — H&P (Signed)
Physical Medicine and Rehabilitation Admission H&P    Chief Complaint  Patient presents with  . Emesis  : HPI: Luis Yu is a 71 y.o. right handed male with history of bilateral TKA, CAD status post CABG, chronic atrial fibrillation maintained on aspirin and Eliquis, hypertension, diabetes mellitus. Per chart review patient lives with spouse. One level home 3 steps to entry. He was independent with a walker prior to admission. Presented 06/28/2016 after being referred to the ER by primary care physician after a fall while at the office with persistent nausea and vomiting. In the ER CT of the head and neck was unremarkable and was planned for  discharge to home. While waiting outside for his wife to pick him up patient with ongoing nausea and vomiting a repeat CT head reviewed,  again negative. Blood pressure 130/110. MRI of the brain showed punctate focus of acute ischemia within the posterior left frontal white matter without hemorrhage or mass effect. Chronic microvascular ischemia and old lacunar infarcts. MRA was unremarkable. Carotid Dopplers with mild to moderate heterogeneous plaque in bilateral carotid artery suggestive of less than 50% stenosis. Echocardiogram with ejection fraction of 60% no wall motion abnormalities. EEG was negative. Neurology follow-up patient remains on aspirin and Eliquis as prior to admission. Tolerating a regular consistency diet.Physical occupational therapy evaluations completed. Patient was admitted for comprehensive rehabilitation program.  Review of Systems  Constitutional: Negative for chills and fever.  HENT: Negative for hearing loss and tinnitus.   Eyes: Negative for blurred vision and double vision.  Respiratory: Negative for cough and shortness of breath.   Cardiovascular: Positive for palpitations. Negative for chest pain and leg swelling.  Gastrointestinal: Positive for constipation.       GERD  Genitourinary: Positive for urgency.    Musculoskeletal: Positive for falls and myalgias.  Skin: Negative for rash.  Neurological: Positive for sensory change and weakness.  Psychiatric/Behavioral: Positive for depression.  All other systems reviewed and are negative.  Past Medical History:  Diagnosis Date  . Adjustment disorder with mixed emotional features   . Atrial flutter ([place])   . CAD (coronary artery disease)   . Depression   . Diabetes mellitus type II, uncontrolled ([place])   . Diabetic peripheral neuropathy ([place])   . Erectile dysfunction   . GERD (gastroesophageal reflux disease)   . Herpes zoster   . Hyperlipidemia   . Hypertension   . Obesity   . Osteoarthritis   . Postherpetic neuralgia   . Sleep apnea    Past Surgical History:  Procedure Laterality Date  . CARDIOVERSION N/A 11/10/2015   Procedure: CARDIOVERSION;  Surgeon: Larey Dresser, MD;  Location: [facility];  Service: Cardiovascular;  Laterality: N/A;  . CARDIOVERSION N/A 02/21/2016   Procedure: CARDIOVERSION;  Surgeon: Lelon Perla, MD;  Location[facility] ENDOSCOPY;  Service: Cardiovascular;  Laterality: N/A;  . CORONARY ARTERY BYPASS GRAFT  1996   eith a LIMA to the LAD, saphenous vein graft to the acute marginal, saphenous vein graft to the PDA and saphenous vein graft to the circumflex.  [name] KNEE ARTHROSCOPY  05/2004   right knee  . REPLACEMENT TOTAL KNEE BILATERAL    . SHOULDER SURGERY     Family History  Problem Relation Age of Onset  . Coronary artery disease Mother     deceased at 41  . Coronary artery disease Father     died age 77  . Stroke      half brother  . Stroke  Brother    Social History:  reports that he has never smoked. He has never used smokeless tobacco. He reports that he drinks about 1.2 oz of alcohol per week . He reports that he does not use drugs. Allergies: No Known Allergies Facility-Administered Medications Prior to Admission  Medication Dose Route Frequency Provider Last Rate Last Dose  . pneumococcal  13-valent conjugate vaccine (PREVNAR 13) injection 0.5 mL  0.5 mL Intramuscular Tomorrow-1000 Dorothyann Peng, NP       Medications Prior to Admission  Medication Sig Dispense Refill  . acetaminophen (TYLENOL) 500 MG tablet Take 2 tablets (1,000 mg total) by mouth every 6 (six) hours as needed. (Patient taking differently: Take 1,000 mg by mouth every 6 (six) hours as needed for mild pain. ) 30 tablet 0  . apixaban (ELIQUIS) 5 MG TABS tablet Take 1 tablet (5 mg total) by mouth 2 (two) times daily. 60 tablet 11  . aspirin EC 81 MG tablet Take 1 tablet (81 mg total) by mouth daily.    [name] atorvastatin (LIPITOR) 80 MG tablet TAKE 1 TABLET (80 MG TOTAL) BY MOUTH DAILY. 90 tablet 3  . buPROPion (WELLBUTRIN SR) 150 MG 12 hr tablet Take 1 tablet (150 mg total) by mouth 2 (two) times daily. 180 tablet 1  . BYSTOLIC 20 MG TABS TAKE 1 TABLET (20 MG TOTAL) BY MOUTH DAILY. 90 tablet 3  . citalopram (CELEXA) 20 MG tablet Take 1 tablet (20 mg total) by mouth daily. 90 tablet 1  . glipiZIDE (GLUCOTROL) 10 MG tablet Take 1 tablet (10 mg total) by mouth 2 (two) times daily before a meal. (Patient taking differently: Take 10 mg by mouth daily before breakfast. ) 180 tablet 3  . glucose blood (ACCU-CHEK ACTIVE STRIPS) test strip Use as instructed 100 each 12  . insulin aspart (NOVOLOG FLEXPEN) 100 UNIT/ML FlexPen USE 15 TO 30 UNITS 15 MINUTES BEFORE MEALS 3 TIMES DAILY 15 pen 6  . Insulin Glargine (BASAGLAR KWIKPEN) 100 UNIT/ML SOPN Inject 0.2 mLs (20 Units total) into the skin at bedtime. 3 pen 11  . Insulin Pen Needle (PEN NEEDLES 31GX5/16") 31G X 8 MM MISC Use to inject insulin 5 times a day. 100 each 11  . isosorbide mononitrate (IMDUR) 30 MG 24 hr tablet Take 1 tablet (30 mg total) by mouth daily. 30 tablet 12  . Lancets (ACCU-CHEK MULTICLIX) lancets Use up to 5 times a day to check blood sugar. 100 each 5  . metFORMIN (GLUCOPHAGE) 1000 MG tablet Take 1 tablet (1,000 mg total) by mouth 2 (two) times daily with a  meal. 180 tablet 3  . omeprazole (PRILOSEC) 20 MG capsule TAKE ONE CAPSULE TWICE A DAY BEFORE A MEAL 180 capsule 0  . ondansetron (ZOFRAN) 4 MG tablet Take 1 tablet (4 mg total) by mouth every 6 (six) hours as needed for nausea or vomiting. 12 tablet 0  . [DISCONTINUED] valsartan-hydrochlorothiazide (DIOVAN-HCT) 80-12.5 MG tablet Take 1 tablet by mouth daily. 90 tablet 3    Home: Home Living Family/patient expects to be discharged to:: Private residence Living Arrangements: Spouse/significant other Available Help at Discharge: Family, Available 24 hours/day Type of Home: House Home Access: Stairs to enter [employer] of Steps: 3 Home Layout: One level Bathroom Shower/Tub: Multimedia programmer: Handicapped height Bathroom Accessibility: Yes Home Equipment: Environmental consultant - 4 wheels, Shower seat  Lives With: Spouse   Functional History: Prior Function Level of Independence: Independent with assistive device(s) Comments: rollator for all mobility. Per aptients  wife Pulmonary MD reccomended that he stop PT 2nd to pulmonary reasons. Since that point his wife reports he has become more sednetary. He mostly transfers from his bed to his chair.   Functional Status:  Mobility: Bed Mobility Overal bed mobility: Modified Independent Bed Mobility: Sit to Supine Sit to supine: Min assist General bed mobility comments: use of rail  Transfers Overall transfer level: Needs assistance Equipment used: Rolling walker (2 wheeled) Transfers: Sit to/from Stand Sit to Stand: Min guard General transfer comment: cues for hand placement and safety with pt able to stand from bed and chair Ambulation/Gait Ambulation/Gait assistance: Min assist Ambulation Distance (Feet): 120 Feet Assistive device: Rolling walker (2 wheeled) Gait Pattern/deviations: Step-through pattern, Decreased stride length, Trunk flexed General Gait Details: cues for posture and position in RW. Pt able to increase  distance today but notes increased DOE with increased distance Gait velocity interpretation: Below normal speed for age/gender    ADL: ADL Overall ADL's : Needs assistance/impaired Eating/Feeding: Set up, Sitting Grooming: Minimal assistance, Sitting Upper Body Bathing: Moderate assistance, Sitting Lower Body Bathing: Maximal assistance, Sit to/from stand Upper Body Dressing : Moderate assistance, Sitting Lower Body Dressing: Maximal assistance, Sit to/from stand Lower Body Dressing Details (indicate cue type and reason): When attempting to adjust socks sitting EOB pt with strong posterior lean/falling posteriorly. General ADL Comments: Pt tolerated standing x2 for ~1 minute each; then quickly sits back down reporting dizziness and fatigue. Pt with SOB with minimal activity; BP 130/110, HR 113.  Pt reports he noticies that he leans/falls backwards often; 5-6 falls in the past 6 months.  Cognition: Cognition Overall Cognitive Status: Within Functional Limits for tasks assessed Orientation Level: Oriented X4 Cognition Arousal/Alertness: Awake/alert Behavior During Therapy: WFL for tasks assessed/performed Overall Cognitive Status: Within Functional Limits for tasks assessed Area of Impairment: Safety/judgement Following Commands: Follows one step commands consistently, Follows one step commands with increased time Safety/Judgement: Decreased awareness of safety Problem Solving: Requires verbal cues, Requires tactile cues General Comments: Spoke with patients wife. At baseline patient was able to follow all commands. He was sedentary   Physical Exam: Blood pressure (!) 176/85, pulse 73, temperature 98.1 F (36.7 C), temperature source Oral, resp. rate 20, height _0  (1.88 m), weight 99.8 kg (220 lb), SpO2 98 %. Physical Exam  Vitals reviewed. Constitutional: He appears well-developed and well-nourished.  HENT:  Head: Normocephalic and atraumatic.  Eyes: Conjunctivae and EOM are  normal. Left eye exhibits no discharge.  Neck: Normal range of motion. Neck supple. No thyromegaly present.  Cardiovascular:  Irregularly irregular  Respiratory: Effort normal and breath sounds normal. No respiratory distress.  GI: Soft. Bowel sounds are normal. He exhibits no distension.  Musculoskeletal: He exhibits no edema or tenderness.  Neurological: He is alert.  A&Ox3 with increased time Made good eye contact with examiner.  Follows simple commands.  4 limb ataxia with right side more affected than left. Motor: RUE/RLE: 4-/5 proximal to distal LUE/LLE: 4/5 proximal to distal  DTRs symmetric  Skin: Skin is warm and dry.  Psychiatric: He has a normal mood and affect. His behavior is normal.     Results for orders placed or performed during the hospital encounter of 06/27/16 (from the past 48 hour(s))  Glucose, capillary     Status: Abnormal   Collection Time: 06/30/16 11:58 AM  Result Value Ref Range   Glucose-Capillary 259 (H) 65 - 99 mg/dL  Glucose, capillary     Status: None   Collection Time: 06/30/16  4:39 PM  Result Value Ref Range   Glucose-Capillary 95 65 - 99 mg/dL  Glucose, capillary     Status: Abnormal   Collection Time: 06/30/16 10:03 PM  Result Value Ref Range   Glucose-Capillary 194 (H) 65 - 99 mg/dL  Glucose, capillary     Status: Abnormal   Collection Time: 07/01/16  6:46 AM  Result Value Ref Range   Glucose-Capillary 140 (H) 65 - 99 mg/dL  Glucose, capillary     Status: Abnormal   Collection Time: 07/01/16  8:29 AM  Result Value Ref Range   Glucose-Capillary 179 (H) 65 - 99 mg/dL  Glucose, capillary     Status: Abnormal   Collection Time: 07/01/16 11:52 AM  Result Value Ref Range   Glucose-Capillary 197 (H) 65 - 99 mg/dL  Glucose, capillary     Status: Abnormal   Collection Time: 07/01/16  4:38 PM  Result Value Ref Range   Glucose-Capillary 196 (H) 65 - 99 mg/dL  Glucose, capillary     Status: Abnormal   Collection Time: 07/01/16  9:59 PM   Result Value Ref Range   Glucose-Capillary 189 (H) 65 - 99 mg/dL  Basic metabolic panel     Status: Abnormal   Collection Time: 07/02/16  2:31 AM  Result Value Ref Range   Sodium 139 135 - 145 mmol/L   Potassium 3.5 3.5 - 5.1 mmol/L   Chloride 102 101 - 111 mmol/L   CO2 28 22 - 32 mmol/L   Glucose, Bld 148 (H) 65 - 99 mg/dL   BUN 16 6 - 20 mg/dL   Creatinine, Ser 1.02 0.61 - 1.24 mg/dL   Calcium 8.0 (L) 8.9 - 10.3 mg/dL   GFR calc non Af Amer >60 >60 mL/min   GFR calc Af Amer >60 >60 mL/min    Comment: (NOTE) The eGFR has been calculated using the CKD EPI equation. This calculation has not been validated in all clinical situations. eGFR's persistently <60 mL/min signify possible Chronic Kidney Disease.    Anion gap 9 5 - 15  CBC     Status: Abnormal   Collection Time: 07/02/16  2:31 AM  Result Value Ref Range   WBC 6.7 4.0 - 10.5 K/uL   RBC 3.68 (L) 4.22 - 5.81 MIL/uL   Hemoglobin 10.7 (L) 13.0 - 17.0 g/dL   HCT 33.4 (L) 39.0 - 52.0 %   MCV 90.8 78.0 - 100.0 fL   MCH 29.1 26.0 - 34.0 pg   MCHC 32.0 30.0 - 36.0 g/dL   RDW 15.4 11.5 - 15.5 %   Platelets 111 (L) 150 - 400 K/uL    Comment: CONSISTENT WITH PREVIOUS RESULT  Glucose, capillary     Status: Abnormal   Collection Time: 07/02/16  6:46 AM  Result Value Ref Range   Glucose-Capillary 123 (H) 65 - 99 mg/dL   No results found.     Medical Problem List and Plan: 1.  Gait disorder with ataxia secondary to left frontal lobe infarct 2.  DVT Prophylaxis/Anticoagulation: Eliquis 3. Pain Management: Tylenol as needed 4. Mood: Wellbutrin 150 mg twice a day, Celexa 20 mg daily 5. Neuropsych: This patient is capable of making decisions on his own behalf. 6. Skin/Wound Care: Routine skin checks 7. Fluids/Electrolytes/Nutrition: Routine I&O with follow-up chemistries 8. CAD with history of CABG. Continue aspirin. No chest pain or shortness of breath 9. Atrial fibrillation. Cardiac rate controlled. Continue Eliquis 10.  Hypertension. Imdur 30 mg daily, Bystolic 20 mg daily 11. Diabetes  mellitus with peripheral neuropathy. Hemoglobin A1c 7.6. Lantus insulin 20 units daily. Check CBGs before meals and at bedtime. Diabetic teaching. Patient was on basalglar 20 units daily, Glucophage 1000 mg twice a day, Glucotrol 10 mg twice a day prior to admission. Resume as needed 12. Hyperlipidemia. Lipitor   Post Admission Physician Evaluation: 1. Preadmission assessment reviewed and changes made below. 2. Functional deficits secondary  to left frontal lobe infarct. 3. Patient is admitted to receive collaborative, interdisciplinary care between the physiatrist, rehab nursing staff, and therapy team. 4. Patient's level of medical complexity and substantial therapy needs in context of that medical necessity cannot be provided at a lesser intensity of care such as a SNF. 5. Patient has experienced substantial functional loss from his/her baseline which was documented above under the "Functional History" and "Functional Status" headings.  Judging by the patient's diagnosis, physical exam, and functional history, the patient has potential for functional progress which will result in measurable gains while on inpatient rehab.  These gains will be of substantial and practical use upon discharge  in facilitating mobility and self-care at the household level. 6. Physiatrist will provide 24 hour management of medical needs as well as oversight of the therapy plan/treatment and provide guidance as appropriate regarding the interaction of the two. 7. The Preadmission Screening has been reviewed and patient status is unchanged unless otherwise stated above. 8. 24 hour rehab nursing will assist with safety, disease management, medication administration and patient education  and help integrate therapy concepts, techniques,education, etc. 9. PT will assess and treat for/with: Lower extremity strength, range of motion, stamina, balance, functional  mobility, safety, adaptive techniques and equipment, coping skills, pain control, education.   Goals are: Mod I. 10. OT will assess and treat for/with: ADL's, functional mobility, safety, upper extremity strength, adaptive techniques and equipment, ego support, and community reintegration.   Goals are: Mod I. Therapy may proceed with showering this patient. 11. Case Management and Social Worker will assess and treat for psychological issues and discharge planning. 12. Team conference will be held weekly to assess progress toward goals and to determine barriers to discharge. 13. Patient will receive at least 3 hours of therapy per day at least 5 days per week. 14. ELOS: 3-6 days.       15. Prognosis:  good  Delice Lesch, MD, Mellody Drown Cathlyn Parsons., PA-C 07/02/2016

## 2016-07-02 NOTE — Progress Notes (Signed)
Rehab admissions - I received a call from patient's wife.  Patient and his wife very much want inpatient rehab admission.  Bed available and will admit to acute inpatient rehab today.  Call me for questions.  #[phone]

## 2016-07-02 NOTE — Progress Notes (Addendum)
Pt discharged from 2W. IV removed. Telemetry removed, CCMD notified. Discussed AVS with pt prior to transport, pt verbalized understanding and was given a copy. Report called to 4W.  Called Mrs. Voong. Updated her to what room Mr. Lingafelter will be transported to.  Leonidas Romberg, RN

## 2016-07-02 NOTE — Progress Notes (Signed)
Received call from CCMD noting that the patient's heart rate dropped to 39. Patient asymptomatic. Will continue to monitor

## 2016-07-02 NOTE — Care Management Note (Signed)
Case Management Note Donn Pierini RN, BSN Unit 2W-Case Manager [phone]  Patient Details  Name: Luis Yu MRN: [id number] Date of Birth: July 08, 1945  Subjective/Objective:  Pt admitted with ischemic stroke                  Action/Plan: PTA pt lived at home with wife- per PT/OT evals recommendations for CIR, CIR consulted- per Britta Mccreedy with CIR- looking at possible bed for Monday 4/16  Expected Discharge Date:  07/02/16               Expected Discharge Plan:  IP Rehab Facility  In-House Referral:     Discharge planning Services  CM Consult  Post Acute Care Choice:  NA Choice offered to:  NA  DME Arranged:    DME Agency:     HH Arranged:    HH Agency:     Status of Service:  Completed, signed off  If discussed at [employer] of Stay Meetings, dates discussed:    Discharge Disposition: IP rehab- CIR   Additional Comments:  07/02/16- 1100- Corayma Cashatt RN, CM- per Genie with CIR - pt has a bed available today and CIR ready to admit pt later today- have notified MD and plan to d/c pt to CIR later this afternoon.   Darrold Span, RN 07/02/2016, 11:42 AM

## 2016-07-02 NOTE — Discharge Summary (Addendum)
Physician Discharge Summary  Luis Yu MRN: [id number] DOB/AGE: November 10, 1945 71 y.o.  PCP: Dorothyann Peng, NP   Admit date: 06/27/2016 Discharge date: 07/02/2016  Discharge Diagnoses:    Principal Problem:   Ischemic stroke of frontal lobe ([place]) Active Problems:   DM (diabetes mellitus) type II uncontrolled with retinopathy   OSA (obstructive sleep apnea)   Essential hypertension   Coronary atherosclerosis   Atrial fibrillation (HCC)   Nausea & vomiting   Thrombocytopenia (HCC)   Renal failure (ARF), acute on chronic (HCC)   Stroke (cerebrum) ([place])   Acute CVA (cerebrovascular accident) ([place])   Acute kidney injury ([place])    Follow-up recommendations Follow-up with PCP in 3-5 days , including all  additional recommended appointments as below Follow-up CBC, CMP in 3-5 days Follow renal function and CBC closely  Current Discharge Medication List    START taking these medications   Details  insulin aspart (NOVOLOG) 100 UNIT/ML injection Correction coverage: Sensitive (thin, NPO, renal)  CBG < 70: implement hypoglycemia protocol  CBG 70 - 120: 0 units  CBG 121 - 150: 1 unit  CBG 151 - 200: 2 units  CBG 201 - 250: 3 units  CBG 251 - 300: 5 units  CBG 301 - 350: 7 units  CBG 351 - 400 9 units  CBG > 400 call MD and obtain STAT lab verification Qty: 10 mL, Refills: 11   Associated Diagnoses: Uncontrolled type 2 diabetes mellitus with diabetic neuropathy, with long-term current use of insulin ([place]); Medication refill    pantoprazole (PROTONIX) 40 MG tablet Take 1 tablet (40 mg total) by mouth daily. Qty: 30 tablet, Refills: 1    thiamine 100 MG tablet Take 1 tablet (100 mg total) by mouth daily. Qty: 30 tablet, Refills: 1    vitamin B-12 (CYANOCOBALAMIN) 1000 MCG tablet Take 2 tablets (2,000 mcg total) by mouth daily. Qty: 60 tablet, Refills: 1      CONTINUE these medications which have CHANGED   Details  glipiZIDE (GLUCOTROL) 5 MG tablet Take 1 tablet (5 mg total)  by mouth 2 (two) times daily before a meal. Qty: 60 tablet, Refills: 2    nebivolol (BYSTOLIC) 10 MG tablet Take 1 tablet (10 mg total) by mouth daily. Qty: 30 tablet, Refills: 2    valsartan-hydrochlorothiazide (DIOVAN-HCT) 80-12.5 MG tablet Take 1 tablet by mouth daily. Qty: 90 tablet, Refills: 3   Associated Diagnoses: Medication refill      CONTINUE these medications which have NOT CHANGED   Details  acetaminophen (TYLENOL) 500 MG tablet Take 2 tablets (1,000 mg total) by mouth every 6 (six) hours as needed. Qty: 30 tablet, Refills: 0    apixaban (ELIQUIS) 5 MG TABS tablet Take 1 tablet (5 mg total) by mouth 2 (two) times daily. Qty: 60 tablet, Refills: 11    aspirin EC 81 MG tablet Take 1 tablet (81 mg total) by mouth daily.    atorvastatin (LIPITOR) 80 MG tablet TAKE 1 TABLET (80 MG TOTAL) BY MOUTH DAILY. Qty: 90 tablet, Refills: 3   Associated Diagnoses: Medication refill    buPROPion (WELLBUTRIN SR) 150 MG 12 hr tablet Take 1 tablet (150 mg total) by mouth 2 (two) times daily. Qty: 180 tablet, Refills: 1   Associated Diagnoses: Medication refill    citalopram (CELEXA) 20 MG tablet Take 1 tablet (20 mg total) by mouth daily. Qty: 90 tablet, Refills: 1   Associated Diagnoses: Depression, recurrent (HCC)    glucose blood (ACCU-CHEK ACTIVE STRIPS) test strip  Use as instructed Qty: 100 each, Refills: 12   Associated Diagnoses: Medication refill    Insulin Glargine (BASAGLAR KWIKPEN) 100 UNIT/ML SOPN Inject 0.2 mLs (20 Units total) into the skin at bedtime. Qty: 3 pen, Refills: 11   Associated Diagnoses: Medication refill    Insulin Pen Needle (PEN NEEDLES 31GX5/16") 31G X 8 MM MISC Use to inject insulin 5 times a day. Qty: 100 each, Refills: 11   Associated Diagnoses: Medication refill    isosorbide mononitrate (IMDUR) 30 MG 24 hr tablet Take 1 tablet (30 mg total) by mouth daily. Qty: 30 tablet, Refills: 12    Lancets (ACCU-CHEK MULTICLIX) lancets Use up to 5 times  a day to check blood sugar. Qty: 100 each, Refills: 5   Associated Diagnoses: Medication refill    metFORMIN (GLUCOPHAGE) 1000 MG tablet Take 1 tablet (1,000 mg total) by mouth 2 (two) times daily with a meal. Qty: 180 tablet, Refills: 3   Associated Diagnoses: Uncontrolled type 2 diabetes mellitus with diabetic neuropathy, with long-term current use of insulin ([place]); Medication refill    omeprazole (PRILOSEC) 20 MG capsule TAKE ONE CAPSULE TWICE A DAY BEFORE A MEAL Qty: 180 capsule, Refills: 0    ondansetron (ZOFRAN) 4 MG tablet Take 1 tablet (4 mg total) by mouth every 6 (six) hours as needed for nausea or vomiting. Qty: 12 tablet, Refills: 0      STOP taking these medications     insulin aspart (NOVOLOG FLEXPEN) 100 UNIT/ML FlexPen           Discharge Condition:  Stable  Discharge Instructions Get Medicines reviewed and adjusted: Please take all your medications with you for your next visit with your Primary MD  Please request your Primary MD to go over all hospital tests and procedure/radiological results at the follow up, please ask your Primary MD to get all Hospital records sent to his/her office.  If you experience worsening of your admission symptoms, develop shortness of breath, life threatening emergency, suicidal or homicidal thoughts you must seek medical attention immediately by calling 911 or calling your MD immediately if symptoms less severe.  You must read complete instructions/literature along with all the possible adverse reactions/side effects for all the Medicines you take and that have been prescribed to you. Take any new Medicines after you have completely understood and accpet all the possible adverse reactions/side effects.   Do not drive when taking Pain medications.   Do not take more than prescribed Pain, Sleep and Anxiety Medications  Special Instructions: If you have smoked or chewed Tobacco in the last 2 yrs please stop smoking, stop any  regular Alcohol and or any Recreational drug use.  Wear Seat belts while driving.  Please note  You were cared for by a hospitalist during your hospital stay. Once you are discharged, your primary care physician will handle any further medical issues. Please note that NO REFILLS for any discharge medications will be authorized once you are discharged, as it is imperative that you return to your primary care physician (or establish a relationship with a primary care physician if you do not have one) for your aftercare needs so that they can reassess your need for medications and monitor your lab values.  Discharge Instructions    Ambulatory referral to Neurology    Complete by:  As directed    Follow up with Dr. Tomi Likens in 4 weeks. Thanks       No Known Allergies    Disposition: CIR  Consults: Neurology     Significant Diagnostic Studies:  Dg Chest 2 View  Result Date: 06/27/2016 CLINICAL DATA:  Left-sided chest pain, cough, and chest congestion. Coronary artery disease. EXAM: CHEST  2 VIEW COMPARISON:  02/15/2016 FINDINGS: The heart size and mediastinal contours are within normal limits. Prior CABG. Both lungs are clear. The visualized skeletal structures are unremarkable. IMPRESSION: Stable exam.  No active cardiopulmonary disease. Electronically Signed   By: Earle Gell M.D.   On: 06/27/2016 18:41   Dg Abd 1 View  Result Date: 06/28/2016 CLINICAL DATA:  Intractable nausea, vomiting for 2 days EXAM: ABDOMEN - 1 VIEW COMPARISON:  CT 05/24/2016 FINDINGS: Nonobstructive bowel gas pattern. Moderate stool burden throughout the colon. No organomegaly or free air. No suspicious calcification. Degenerative changes in the hips and lower lumbar spine. IMPRESSION: Moderate stool burden in the colon. No evidence of obstruction or free air. Electronically Signed   By: Rolm Baptise M.D.   On: 06/28/2016 10:41   Ct Head Wo Contrast  Result Date: 06/28/2016 CLINICAL DATA:  Patient fell on  concrete hitting top of head. EXAM: CT HEAD WITHOUT CONTRAST TECHNIQUE: Contiguous axial images were obtained from the base of the skull through the vertex without intravenous contrast. COMPARISON:  None. FINDINGS: BRAIN: There mild is sulcal and ventricular prominence consistent with superficial and central atrophy. No intraparenchymal hemorrhage, mass effect nor midline shift. Periventricular and subcortical white matter hypodensities consistent with chronic small vessel ischemic disease are identified. No acute large vascular territory infarcts. No abnormal extra-axial fluid collections. Basal cisterns are not effaced and midline. VASCULAR: Moderate calcific atherosclerosis of the carotid siphons and both vertebral arteries. SKULL: No skull fracture. No significant scalp soft tissue swelling. SINUSES/ORBITS: The mastoid air-cells are clear. The included paranasal sinuses are well-aerated within the left maxillary sinus with partially visualized small mucous retention cystThe included ocular globes and orbital contents are non-suspicious. OTHER: None. IMPRESSION: 1. Chronic appearing small vessel ischemic disease of periventricular white matter with cerebral mild atrophy. 2. No acute intracranial abnormality. Electronically Signed   By: Ashley Royalty M.D.   On: 06/28/2016 01:36   Ct Head Wo Contrast  Result Date: 06/27/2016 CLINICAL DATA:  Tripped over a step, hit top of his head, patient takes blood thinner EXAM: CT HEAD WITHOUT CONTRAST TECHNIQUE: Contiguous axial images were obtained from the base of the skull through the vertex without intravenous contrast. COMPARISON:  05/24/2016 FINDINGS: Brain: No intracranial hemorrhage, mass effect or midline shift. No acute cortical infarction. Stable atrophy in chronic small vessel ischemic changes bilateral periventricular and patchy subcortical white matter. No mass lesion is noted on this unenhanced scan. Ventricular size is stable from prior exam. No  intraventricular hemorrhage. Vascular: Atherosclerotic calcifications of carotid siphon again noted. Atherosclerotic calcifications bilateral vertebral arteries. Skull: There is no skull fracture. Sinuses/Orbits: Probable mucous retention cyst partially visualized left maxillary sinus measures 1.4 cm. Other: None IMPRESSION: No acute intracranial abnormality. Stable atrophy and chronic white matter small vessel ischemic changes. No definite acute cortical infarction. Electronically Signed   By: Lahoma Crocker M.D.   On: 06/27/2016 17:24   Ct Cervical Spine Wo Contrast  Result Date: 06/27/2016 CLINICAL DATA:  71 y/o M; status post fall with head injury. No neck complaints at this time. EXAM: CT CERVICAL SPINE WITHOUT CONTRAST TECHNIQUE: Multidetector CT imaging of the cervical spine was performed without intravenous contrast. Multiplanar CT image reconstructions were also generated. COMPARISON:  None. FINDINGS: Alignment: Straightening of cervical lordosis with mild reversal at  the C5-6 level. No listhesis. Slight clockwise rotation of C1 on C2 is likely positional. Skull base and vertebrae: No acute fracture. No primary bone lesion or focal pathologic process. Soft tissues and spinal canal: No prevertebral fluid or swelling. No visible canal hematoma. Disc levels: Moderate cervical spondylosis with with predominantly discogenic degenerative changes at the C6 through T1 levels and left-greater-than-right facet degenerative changes. There are multiple levels of canal stenosis greatest at C6-7 where it is at least moderate. No high-grade bony foraminal narrowing. Extensive productive changes of the anterior C1-2 articulation. Upper chest: Negative. Other: Moderate calcific atherosclerosis of the carotid bifurcations bilaterally. Otherwise negative. IMPRESSION: 1. No acute fracture or dislocation is identified. 2. Moderate cervical spondylosis greatest at C6-7 where there is at least moderate canal stenosis.  Electronically Signed   By: Kristine Garbe M.D.   On: 06/27/2016 18:41   Mr Brain Wo Contrast  Result Date: 06/28/2016 CLINICAL DATA:  Ataxia and vomiting EXAM: MRI HEAD WITHOUT CONTRAST TECHNIQUE: Multiplanar, multiecho pulse sequences of the brain and surrounding structures were obtained without intravenous contrast. COMPARISON:  Head CT 06/28/2016 Brain MRI 02/16/2013 FINDINGS: Brain: There is a punctate focus of diffusion restriction within the posterior left frontal white matter, near the base of the left central sulcus. There is beginning confluent hyperintense T2-weighted signal within the periventricular white matter, most often seen in the setting of chronic microvascular ischemia. Old lacunar infarct in the superior left frontal white matter is unchanged. No mass lesion or midline shift. No hydrocephalus or extra-axial fluid collection. The midline structures are normal. No age advanced or lobar predominant atrophy. Vascular: Major intracranial arterial and venous sinus flow voids are preserved. No evidence of chronic microhemorrhage or amyloid angiopathy. Skull and upper cervical spine: The visualized skull base, calvarium, upper cervical spine and extracranial soft tissues are normal. Sinuses/Orbits: No fluid levels or advanced mucosal thickening. No mastoid effusion. Normal orbits. IMPRESSION: 1. Punctate focus of acute ischemia within the posterior left frontal white matter without hemorrhage or mass effect. 2. Chronic microvascular ischemia and old lacunar infarcts, unchanged compared to the 02/16/2013. Electronically Signed   By: Ulyses Jarred M.D.   On: 06/28/2016 06:02   US Renal  Result Date: 06/28/2016 CLINICAL DATA:  Acute kidney injury EXAM: RENAL / URINARY TRACT ULTRASOUND COMPLETE COMPARISON:  05/24/2016 CT abdomen/ pelvis. FINDINGS: Right Kidney: Length: 13.9 cm. Mildly echogenic right renal parenchyma, which is normal in thickness. No right hydronephrosis. Simple appearing  exophytic 1.5 x 0.9 x 1.0 cm renal cyst in the medial upper right kidney. No additional right renal lesions. Left Kidney: Length: 13.7 cm. Mildly echogenic left renal parenchyma, which is normal in thickness. No left hydronephrosis. No left renal mass . Bladder: Appears normal for degree of bladder distention. IMPRESSION: 1. No hydronephrosis. 2. Mildly echogenic normal size kidneys, compatible with the provided history of nonspecific acute renal parenchymal disease. 3. Small simple right renal cyst . 4. Normal bladder. Electronically Signed   By: Ilona Sorrel M.D.   On: 06/28/2016 10:43   Mr Jodene Nam Head/brain SA Cm  Result Date: 06/28/2016 CLINICAL DATA:  Infarct on brain MRI EXAM: MRA HEAD WITHOUT CONTRAST TECHNIQUE: Angiographic images of the Circle of Willis were obtained using MRA technique without intravenous contrast. COMPARISON:  Brain MRI from earlier today FINDINGS: Symmetric enhancement of the carotid and vertebral arteries at the skullbase. Mild asymmetry of carotid size, larger on the left, attributed to larger A1 on the left.Mild left vertebral artery dominance. Standard vertebrobasilar branching. There  is no major branch occlusion or flow limiting stenosis. Atherosclerotic irregularity bilateral carotid siphons with mild bilateral supraclinoid ICA narrowing, borderline moderate on the right. Posterior circulation vessels are smooth. Tiny inferior outpouchings from the bilateral supraclinoid ICA is attributed to infundibula based on source images. No generalized vessel beading. IMPRESSION: 1. No acute finding. 2. Intracranial atherosclerotic changes with mild bilateral supraclinoid ICA narrowing. Electronically Signed   By: Monte Fantasia M.D.   On: 06/28/2016 13:21    echocardiogram  LV EF: 55% -   60%  ------------------------------------------------------------------- Indications:      Stroke 434.91.  ------------------------------------------------------------------- History:   PMH:    Atrial fibrillation.  PMH:  OSA, Obesity.  Risk factors:  Hypertension. Diabetes mellitus. Dyslipidemia.  ------------------------------------------------------------------- Study Conclusions  - Left ventricle: Wall thickness was increased in a pattern of   moderate LVH. Systolic function was normal. The estimated   ejection fraction was in the range of 55% to 60%. Wall motion was   normal; there were no regional wall motion abnormalities. - Mitral valve: Mildly calcified annulus. There was mild   regurgitation. - Left atrium: The atrium was mildly dilated. - Right atrium: The atrium was mildly dilated. - Pulmonary arteries: Systolic pressure was moderately increased.   PA peak pressure: 42 mm Hg (S).      Filed Weights   06/27/16 2141  Weight: 99.8 kg (220 lb)       Labs: Results for orders placed or performed during the hospital encounter of 06/27/16 (from the past 48 hour(s))  Glucose, capillary     Status: Abnormal   Collection Time: 06/30/16 11:58 AM  Result Value Ref Range   Glucose-Capillary 259 (H) 65 - 99 mg/dL  Glucose, capillary     Status: None   Collection Time: 06/30/16  4:39 PM  Result Value Ref Range   Glucose-Capillary 95 65 - 99 mg/dL  Glucose, capillary     Status: Abnormal   Collection Time: 06/30/16 10:03 PM  Result Value Ref Range   Glucose-Capillary 194 (H) 65 - 99 mg/dL  Glucose, capillary     Status: Abnormal   Collection Time: 07/01/16  6:46 AM  Result Value Ref Range   Glucose-Capillary 140 (H) 65 - 99 mg/dL  Glucose, capillary     Status: Abnormal   Collection Time: 07/01/16  8:29 AM  Result Value Ref Range   Glucose-Capillary 179 (H) 65 - 99 mg/dL  Glucose, capillary     Status: Abnormal   Collection Time: 07/01/16 11:52 AM  Result Value Ref Range   Glucose-Capillary 197 (H) 65 - 99 mg/dL  Glucose, capillary     Status: Abnormal   Collection Time: 07/01/16  4:38 PM  Result Value Ref Range   Glucose-Capillary 196 (H) 65 - 99 mg/dL   Glucose, capillary     Status: Abnormal   Collection Time: 07/01/16  9:59 PM  Result Value Ref Range   Glucose-Capillary 189 (H) 65 - 99 mg/dL  Basic metabolic panel     Status: Abnormal   Collection Time: 07/02/16  2:31 AM  Result Value Ref Range   Sodium 139 135 - 145 mmol/L   Potassium 3.5 3.5 - 5.1 mmol/L   Chloride 102 101 - 111 mmol/L   CO2 28 22 - 32 mmol/L   Glucose, Bld 148 (H) 65 - 99 mg/dL   BUN 16 6 - 20 mg/dL   Creatinine, Ser 1.02 0.61 - 1.24 mg/dL   Calcium 8.0 (L) 8.9 - 10.3 mg/dL  GFR calc non Af Amer >60 >60 mL/min   GFR calc Af Amer >60 >60 mL/min    Comment: (NOTE) The eGFR has been calculated using the CKD EPI equation. This calculation has not been validated in all clinical situations. eGFR's persistently <60 mL/min signify possible Chronic Kidney Disease.    Anion gap 9 5 - 15  CBC     Status: Abnormal   Collection Time: 07/02/16  2:31 AM  Result Value Ref Range   WBC 6.7 4.0 - 10.5 K/uL   RBC 3.68 (L) 4.22 - 5.81 MIL/uL   Hemoglobin 10.7 (L) 13.0 - 17.0 g/dL   HCT 33.4 (L) 39.0 - 52.0 %   MCV 90.8 78.0 - 100.0 fL   MCH 29.1 26.0 - 34.0 pg   MCHC 32.0 30.0 - 36.0 g/dL   RDW 15.4 11.5 - 15.5 %   Platelets 111 (L) 150 - 400 K/uL    Comment: CONSISTENT WITH PREVIOUS RESULT  Glucose, capillary     Status: Abnormal   Collection Time: 07/02/16  6:46 AM  Result Value Ref Range   Glucose-Capillary 123 (H) 65 - 99 mg/dL     Lipid Panel     Component Value Date/Time   CHOL 96 06/28/2016 0718   TRIG 88 06/28/2016 0718   HDL 28 (L) 06/28/2016 0718   CHOLHDL 3.4 06/28/2016 0718   VLDL 18 06/28/2016 0718   LDLCALC 50 06/28/2016 0718     Lab Results  Component Value Date   HGBA1C 7.6 (H) 06/28/2016   HGBA1C 6.9 04/24/2016   HGBA1C 7.0 01/10/2016       HPI :    Luis Yu is a 70 y.o. male with history of CAD status post CABG, chronic atrial fibrillation, hypertension, diabetes mellitus and sleep apnea was referred to the ER by patient's  primary care physician yesterday after patient had a fall at the office. Patient states 2 days ago patient started having persistent nausea vomiting which improved by evening 2 days ago. History patient had gone to the PCPs office and had loss of balance and fell. Denies hitting his head or losing consciousness. In the ER patient had CT of the head and neck which was unremarkable and was discharged home. While waiting for his wife outside the patient again had started having nausea vomiting. A repeat CT head was done which was negative. While I'm reading the ER patient was found to have ataxia and patient had MRI of the brain which shows acute ischemia involving the left frontal lobe. Patient denies any difficulty swallowing speaking or any weakness of the extremities. Denies any headache or visual symptoms. Denies any abdominal pain. Patient states his wife also has been having nausea vomiting last few days. Denies any diarrhea. Patient is being admitted for stroke and also nausea vomiting. Labs also revealed acute on chronic renal failure  HOSPITAL COURSE:   1. Left frontal stroke -   neurology following . Neurology is recommended to continue Apixaban Along with aspirin. Stroke/swallowevaluation heart healthy. Stable neuro checks.  MRA brain negative,EEG negative,  2-D echo results as above,  carotid Doppler-Mild to moderate heterogeneous plaque in bilateral carotid arteries suggestive of less than 50% stenosis ,  hemoglobin A1c 7.6  and lipid panel-LDL 50.Continue eliquisand aspirin on discharge. Follow-up with Dr. Tomi [facility]ens at [facility] in 4 weeks. Patient is being discharged to CIR   2. Acute on chronic renal failure stage III-prerenal. baseline creatinine 1.15.  . Creatinine 1.77 on admission, now 1.94>1.19.Creatinine is  back to baseline after IV hydration, probably from nausea vomiting prior to admission. UA is unremarkable. Continued hydration. Hold ARB and hydrochlorothiazide, resume in a few days.     3. Persistent nausea vomiting - patient states it has improved , tolerating diet. Abdomen appears benign. Likely could be viral gastroenteritis given patient's wife also has same symptoms.    4. Paroxysmal atrial fibrillation- rate controlled chads 2 vasc score is more than 2 patient is on Apixaban. On bystolic. Patient noted to have bradycardia while asleep. By systolic dose reduced to 10 mg a day   5. CAD status post CABG - denies any chest pain. Troponin negative 2   6. Diabetes mellitus type 2 -initially held metformin and glipizide since patient is having acute renal failure. Resume medications. Reduce dose of glipizide to 5 mg twice a day. Continue   Lantus with pre-meal NovoLog and sliding scale coverage. Hemoglobin A1c 7.6   7. Thrombocytopenia -appears to be new.    151 on 02/15/16, now 111, May need outpatient hematology evaluation for thrombocytopenia   8. Anemia - hemoglobin stable around 11.3>10.7 , prior to discharge   9. FTT -multifactorial, CK slightly elevated, vitamin B-12- 214 , TSH normal. Started on B-12 supplementation   10. Weight loss-patient had an CT scan last month that did not show any obvious malignancy. He will need age-appropriate cancer screening by PCP. Findings of the CT scan were discussed with the patient's wife    Discharge Exam: *  Blood pressure (!) 176/85, pulse 73, temperature 98.1 F (36.7 C), temperature source Oral, resp. rate 20, height _0  (1.88 m), weight 99.8 kg (220 lb), SpO2 98 %.   General exam: Appears calm and comfortable  Respiratory system: Clear to auscultation. Respiratory effort normal. Cardiovascular system: S1 & S2 heard, RRR. No JVD, murmurs, rubs, gallops or clicks. No pedal edema. Gastrointestinal system: Abdomen is nondistended, soft and nontender. No organomegaly or masses felt. Normal bowel sounds heard. Central nervous system: Alert and oriented. No focal neurological deficits. Extremities: Symmetric 5  x 5 power. Skin: No rashes, lesions or ulcers Psychiatry: Judgement and insight appear normal. Mood & affect appropriate.     Follow-up Information    Dudley Major, DO. Schedule an appointment as soon as possible for a visit in 4 week(s).   Specialty:  Neurology Contact information: [place] [id number] [phone]        Dorothyann Peng, NP. Call.   Specialty:  Family Medicine Why:  Hospital follow-up in 3-5 days, if going to [facility] Contact information: [place] [address] [phone]           Signed: Reyne Dumas 07/02/2016, 8:45 AM        Time spent >45 mins

## 2016-07-02 NOTE — Progress Notes (Signed)
Trish Mage, RN Rehab Admission Coordinator Signed Physical Medicine and Rehabilitation  PMR Pre-admission Date of Service: 06/29/2016 5:12 PM  Related encounter: ED to Hosp-Admission (Current) from 06/27/2016 in [facility] 2W CARDIAC UNIT       Hide copied text PMR Admission Coordinator Pre-Admission Assessment  Patient: Luis Yu is an 71 y.o., male MRN: [id number] DOB: 1946-01-18 Height:  (188 cm) Weight: 99.8 kg (220 lb)                                                                                                                                             Insurance Information HMO:   PPO:      PCP:      IPA:      80/20: yes     OTHER: no HMO PRIMARY: Medicare a and b      Policy#: [id number]      Subscriber: pt Benefits:  Phone #: online     Name: 06/29/2016 Eff. Date: 10/18/2006     Deduct: $1340      Out of Pocket Max: none      Life Max: none CIR: 100%      SNF: 20 full days Outpatient: 80%     Co-Pay: 20% Home Health: 100%      Co-Pay: none DME: 80%     Co-Pay: 20% Providers: pt choice  SECONDARY: [employer]      Policy#: [id number]      Subscriber: pt  Medicaid Application Date:       Case Manager:  Disability Application Date:       Case Worker:   Emergency Contact Information        Contact Information    Name Relation Home Work Mobile   Usman,[name] Spouse [phone]     Current Medical History  Patient Admitting Diagnosis: left frontal infarct, preexisting gait disorder with ataxia  History of Present Illness: HPI: Luis Yu a 71 y.o.right handed malewith history of bilateral TKA, CAD status post CABG, chronic atrial fibrillation maintained on aspirin and Eliquis, hypertension, diabetes mellitus.  Presented 06/28/2016 after being referred to the ER by primary care physician after a fall while at the office with persistent nausea and vomiting. In the ER CT of the head and neck was unremarkable  was planned discharge to home. While waiting outside for his wife to pick him up patient with ongoing nausea and vomiting a repeat CT of the head was again negative. Blood pressure 130/110. MRI of the brain showed punctate focus of acute ischemia within the posterior left frontal white matter without hemorrhage or mass effect. Chronic microvascular ischemia and old lacunar infarcts. MRA was unremarkable. Carotid Dopplers with mild to moderate heterogeneous plaque in bilateral carotid artery suggestive of less than 50% stenosis. Echocardiogram pending. Neurology follow-up patient remains on aspirin and Eliquisas prior  to admission. Tolerating a regular consistency diet.  Total: 0 NIH  Past Medical History      Past Medical History:  Diagnosis Date  . Adjustment disorder with mixed emotional features   . Atrial flutter (HCC)   . CAD (coronary artery disease)   . Depression   . Diabetes mellitus type II, uncontrolled (HCC)   . Diabetic peripheral neuropathy (HCC)   . Erectile dysfunction   . GERD (gastroesophageal reflux disease)   . Herpes zoster   . Hyperlipidemia   . Hypertension   . Obesity   . Osteoarthritis   . Postherpetic neuralgia   . Sleep apnea     Family History  family history includes Coronary artery disease in his father and mother; Stroke in his brother.  Prior Rehab/Hospitalizations:  Has the patient had major surgery during 100 days prior to admission? No  Current Medications   Current Facility-Administered Medications:  .   stroke: mapping our early stages of recovery book, , Does not apply, Once, Eduard Clos, MD .  acetaminophen (TYLENOL) tablet 650 mg, 650 mg, Oral, Q4H PRN, 650 mg at 07/01/16 1013 **OR** acetaminophen (TYLENOL) solution 650 mg, 650 mg, Per Tube, Q4H PRN **OR** acetaminophen (TYLENOL) suppository 650 mg, 650 mg, Rectal, Q4H PRN, Eduard Clos, MD .  acetaminophen (TYLENOL) tablet 1,000 mg, 1,000 mg, Oral,  Once, Lavera Guise, MD .  apixaban [facility]) tablet 5 mg, 5 mg, Oral, BID, Eduard Clos, MD, 5 mg at 07/02/16 0941 .  aspirin EC tablet 81 mg, 81 mg, Oral, Daily, Eduard Clos, MD, 81 mg at 07/02/16 0941 .  atorvastatin (LIPITOR) tablet 80 mg, 80 mg, Oral, Daily, Eduard Clos, MD, 80 mg at 07/02/16 0942 .  buPROPion [facility] SR) 12 hr tablet 150 mg, 150 mg, Oral, BID, Eduard Clos, MD, 150 mg at 07/02/16 0941 .  citalopram (CELEXA) tablet 20 mg, 20 mg, Oral, Daily, Eduard Clos, MD, 20 mg at 07/02/16 0941 .  cyanocobalamin ((VITAMIN B-12)) injection 1,000 mcg, 1,000 mcg, Intramuscular, q morning - 10a, Richarda Overlie, MD, 1,000 mcg at 07/01/16 1014 .  insulin aspart (novoLOG) injection 0-9 Units, 0-9 Units, Subcutaneous, TID WC, Eduard Clos, MD, 2 Units at 07/01/16 1641 .  insulin glargine (LANTUS) injection 20 Units, 20 Units, Subcutaneous, QHS, Eduard Clos, MD, 20 Units at 07/01/16 2220 .  isosorbide mononitrate (IMDUR) 24 hr tablet 30 mg, 30 mg, Oral, Daily, Eduard Clos, MD, 30 mg at 07/02/16 0941 .  nebivolol (BYSTOLIC) tablet 20 mg, 20 mg, Oral, Daily, Eduard Clos, MD, 20 mg at 07/02/16 0941 .  pantoprazole (PROTONIX) EC tablet 40 mg, 40 mg, Oral, Daily, Eduard Clos, MD, 40 mg at 07/02/16 0941 .  thiamine (VITAMIN B-1) tablet 100 mg, 100 mg, Oral, Daily, Richarda Overlie, MD, 100 mg at 07/02/16 0941  Patients Current Diet: Diet heart healthy/carb modified Room service appropriate? Yes; Fluid consistency: Thin Diet - low sodium heart healthy  Precautions / Restrictions Precautions Precautions: Fall Restrictions Weight Bearing Restrictions: No   Has the patient had 2 or more falls or a fall with injury in the past year?Yes over last 6 months has frequent falls resulting in rib fractures, and broken toe  Prior Activity Level Limited Community (1-2x/wk): decreased functional gait over past 6 months  Home  Assistive Devices / Equipment Home Assistive Devices/Equipment: Medical laboratory scientific officer (specify quad or straight), Wheelchair Home Equipment: Environmental consultant - 4 wheels, Shower seat  Prior Device Use: Indicate  devices/aids used by the patient prior to current illness, exacerbation or injury? Walker  Prior Functional Level Prior Function Level of Independence: Independent with assistive device(s) Comments: rollator for all mobility. Per aptients wife Pulmonary MD reccomended that he stop PT 2nd to pulmonary reasons. Since that point his wife reports he has become more sednetary. He mostly transfers from his bed to his chair.   Self Care: Did the patient need help bathing, dressing, using the toilet or eating?  Needed some help  Indoor Mobility: Did the patient need assistance with walking from room to room (with or without device)? Needed some help  Stairs: Did the patient need assistance with internal or external stairs (with or without device)? Needed some help  Functional Cognition: Did the patient need help planning regular tasks such as shopping or remembering to take medications? Needed some help  Current Functional Level Cognition  Overall Cognitive Status: Impaired/Different from baseline Orientation Level: Oriented X4 Following Commands: Follows one step commands consistently, Follows one step commands with increased time Safety/Judgement: Decreased awareness of safety, Decreased awareness of deficits General Comments: Spoke with patients wife. At baseline patient was able to follow all commands. He was sedentary     Extremity Assessment (includes Sensation/Coordination)  Upper Extremity Assessment: Defer to OT evaluation  Lower Extremity Assessment: Generalized weakness    ADLs  Overall ADL's : Needs assistance/impaired Eating/Feeding: Set up, Sitting Grooming: Wash/dry face, Wash/dry hands, Brushing hair, Oral care, Minimal assistance, Standing, Cueing for safety Grooming Details  (indicate cue type and reason): Pt stood at sink for appx 10 minutes with one 3 minute rest break to complete all grooming.  Pt had occasional swaying with min assist to correct balance.   Upper Body Bathing: Moderate assistance, Sitting Lower Body Bathing: Maximal assistance, Sit to/from stand Upper Body Dressing : Minimal assistance, Sitting Lower Body Dressing: Minimal assistance, Sit to/from stand, Cueing for safety Lower Body Dressing Details (indicate cue type and reason): Pt able to donn pants and socks with min assist to stand and pull pants up.  Pt also requires assist managing zippers and snaps because he loses balance when he lets go of the walker with both hands.   Toilet Transfer: Minimal assistance, Ambulation, BSC, Regular Toilet, RW Toilet Transfer Details (indicate cue type and reason): Pt walked to bathroom with 3:1 over commode.  Pt is impulsive with his movement. Toileting- Architect and Hygiene: Min guard, Sit to/from stand Functional mobility during ADLs: Minimal assistance, Rolling walker General ADL Comments: Pt continues to have balance deficits in standing.  Pt acknowledges the deficits but when asked if it is safe for him to get up alone, he always answers yes.  Reviewed with pt at length the need to call for help to get up.  Pt did ask if he would have "all these alarms" on his chairs adn bed on the rehab unit.  Explained to him that his therapy team would decide when they feel it is safe for him to transfer on his own and he is free to ask them at any time if he is improving with his transfer safety.    Mobility  Overal bed mobility: Modified Independent Bed Mobility: Sit to Supine Sit to supine: Min assist General bed mobility comments: use of rail     Transfers  Overall transfer level: Needs assistance Equipment used: Rolling walker (2 wheeled) Transfers: Sit to/from Stand Sit to Stand: Min guard General transfer comment: cues for hand placement  and safety with pt  able to stand from bed and chair    Ambulation / Gait / Stairs / Wheelchair Mobility  Ambulation/Gait Ambulation/Gait assistance: Min assist Ambulation Distance (Feet): 120 Feet Assistive device: Rolling walker (2 wheeled) Gait Pattern/deviations: Step-through pattern, Decreased stride length, Trunk flexed General Gait Details: cues for posture and position in RW. Pt able to increase distance today but notes increased DOE with increased distance Gait velocity interpretation: Below normal speed for age/gender    Posture / Balance Dynamic Sitting Balance Sitting balance - Comments: pt able to sit EOB 4 min without lean or assist today Static Standing Balance Tandem Stance - Right Leg: 0 Tandem Stance - Left Leg: 0 Rhomberg - Eyes Opened: 30 Rhomberg - Eyes Closed: 10 Balance Overall balance assessment: Needs assistance, History of Falls Sitting-balance support: Feet supported, No upper extremity supported Sitting balance-Leahy Scale: Good Sitting balance - Comments: pt able to sit EOB 4 min without lean or assist today Postural control: Posterior lean, Right lateral lean Standing balance support: Bilateral upper extremity supported, During functional activity Standing balance-Leahy Scale: Fair Standing balance comment: Pt able to stand some at the sink unsupported while combing his hair.  Feel better if he has a solid surface like a sink in front of him.   Tandem Stance - Right Leg: 0 Tandem Stance - Left Leg: 0 Rhomberg - Eyes Opened: 30 Rhomberg - Eyes Closed: 10 High Level Balance Comments: pt requires HHA to position feet into Rhomberg but can maintain once there    Special needs/care consideration BiPAP/CPAP  yes CPM  no Continuous Drip IV  N/a Dialysis  N/a Life Vest n/a Oxygen  N/a Special Bed  N/a Trach Size  N/a Wound Vac (area)  N/a Skin No                              Bowel mgmt: Last BM 06/29/16 Bladder mgmt: WDL Diabetic mgmt Hgb A1C 6.9,  yes diabetic, uncontrolled at times Fall risk:  Yes, frequent falls   Previous Home Environment Living Arrangements: Spouse/significant other  Lives With: Spouse Available Help at Discharge: Family, Available 24 hours/day Type of Home: House Home Layout: One level Home Access: Stairs to enter Secretary/administrator of Steps: 3 Bathroom Shower/Tub: Health visitor: Handicapped height Bathroom Accessibility: Yes How Accessible: Accessible via walker Home Care Services: No  Discharge Living Setting Plans for Discharge Living Setting: Patient's home, Lives with (comment) (wife) Type of Home at Discharge: House Discharge Home Layout: One level Discharge Home Access: Stairs to enter Entrance Stairs-Rails: None Entrance Stairs-Number of Steps: 3 Discharge Bathroom Shower/Tub: Walk-in shower Discharge Bathroom Toilet: Handicapped height Discharge Bathroom Accessibility: Yes How Accessible: Accessible via walker Does the patient have any problems obtaining your medications?: No  Patient and spouse have homes in [place] s well as [place]. [facility]ed to [facility] where is MDs were due to dev=declining functional mobility  Social/Family/Support Systems Patient Roles: Spouse Contact Information: [name], wife Anticipated Caregiver: wife Anticipated Industrial/product designer Information: see above Ability/Limitations of Caregiver: no limitations Caregiver Availability: 24/7 Discharge Plan Discussed with Primary Caregiver: Yes Is Caregiver In Agreement with Plan?: Yes Does Caregiver/Family have Issues with Lodging/Transportation while Pt is in Rehab?: No  Goals/Additional Needs Patient/Family Goal for Rehab: Mod I to supervision with PT, OT, and SLP Expected length of stay: ELOS 14-20 days Pt/Family Agrees to Admission and willing to participate: Yes Program Orientation Provided & Reviewed with Pt/Caregiver Including Roles  &  Responsibilities: Yes  Decrease burden of Care through  IP rehab admission: n/a  Possible need for SNF placement upon discharge: not anticipated  Patient Condition: This patient's medical and functional status has changed since the consult dated: 06/29/2016 in which the Rehabilitation Physician determined and documented that the patient's condition is appropriate for intensive rehabilitative care in an inpatient rehabilitation facility. See "History of Present Illness" (above) for medical update. Functional changes are: Currently requiring min assist to ambulate 120 feet RW. Patient's medical and functional status update has been discussed with the Rehabilitation physician and patient remains appropriate for inpatient rehabilitation. Will admit to inpatient rehab today.  Preadmission Screen Completed By:  Trish Mage, 07/02/2016 11:18 AM ______________________________________________________________________   Discussed status with Dr. Allena Katz on 07/02/16 at 1118 and received telephone approval for admission today.  Admission Coordinator:  Trish Mage, time 1118/Date 07/02/16       Cosigned by: Ankit Karis Juba, MD at 07/02/2016 11:29 AM  Revision History

## 2016-07-03 ENCOUNTER — Inpatient Hospital Stay (HOSPITAL_COMMUNITY): Payer: Medicare Other | Admitting: Occupational Therapy

## 2016-07-03 ENCOUNTER — Inpatient Hospital Stay (HOSPITAL_COMMUNITY): Payer: Medicare Other | Admitting: Speech Pathology

## 2016-07-03 ENCOUNTER — Inpatient Hospital Stay (HOSPITAL_COMMUNITY): Payer: Medicare Other | Admitting: Physical Therapy

## 2016-07-03 ENCOUNTER — Other Ambulatory Visit: Payer: Self-pay | Admitting: Adult Health

## 2016-07-03 DIAGNOSIS — I69314 Frontal lobe and executive function deficit following cerebral infarction: Secondary | ICD-10-CM

## 2016-07-03 DIAGNOSIS — R269 Unspecified abnormalities of gait and mobility: Secondary | ICD-10-CM

## 2016-07-03 DIAGNOSIS — I69398 Other sequelae of cerebral infarction: Secondary | ICD-10-CM

## 2016-07-03 LAB — CBC WITH DIFFERENTIAL/PLATELET
Basophils Absolute: 0 10*3/uL (ref 0.0–0.1)
Basophils Relative: 0 %
Eosinophils Absolute: 1.1 10*3/uL — ABNORMAL HIGH (ref 0.0–0.7)
Eosinophils Relative: 15 %
HCT: 36.6 % — ABNORMAL LOW (ref 39.0–52.0)
Hemoglobin: 11.7 g/dL — ABNORMAL LOW (ref 13.0–17.0)
Lymphocytes Relative: 16 %
Lymphs Abs: 1.2 10*3/uL (ref 0.7–4.0)
MCH: 28.9 pg (ref 26.0–34.0)
MCHC: 32 g/dL (ref 30.0–36.0)
MCV: 90.4 fL (ref 78.0–100.0)
Monocytes Absolute: 0.5 10*3/uL (ref 0.1–1.0)
Monocytes Relative: 6 %
Neutro Abs: 4.5 10*3/uL (ref 1.7–7.7)
Neutrophils Relative %: 63 %
Platelets: 125 10*3/uL — ABNORMAL LOW (ref 150–400)
RBC: 4.05 MIL/uL — ABNORMAL LOW (ref 4.22–5.81)
RDW: 15.5 % (ref 11.5–15.5)
WBC: 7.3 10*3/uL (ref 4.0–10.5)

## 2016-07-03 LAB — COMPREHENSIVE METABOLIC PANEL
ALT: 33 U/L (ref 17–63)
AST: 29 U/L (ref 15–41)
Albumin: 3.1 g/dL — ABNORMAL LOW (ref 3.5–5.0)
Alkaline Phosphatase: 88 U/L (ref 38–126)
Anion gap: 8 (ref 5–15)
BUN: 14 mg/dL (ref 6–20)
CO2: 27 mmol/L (ref 22–32)
Calcium: 8.4 mg/dL — ABNORMAL LOW (ref 8.9–10.3)
Chloride: 103 mmol/L (ref 101–111)
Creatinine, Ser: 0.96 mg/dL (ref 0.61–1.24)
GFR calc Af Amer: >60 mL/min (ref >60)
GFR calc non Af Amer: >60 mL/min (ref >60)
Glucose, Bld: 115 mg/dL — ABNORMAL HIGH (ref 65–99)
Potassium: 3.7 mmol/L (ref 3.5–5.1)
Sodium: 138 mmol/L (ref 135–145)
Total Bilirubin: 1.4 mg/dL — ABNORMAL HIGH (ref 0.3–1.2)
Total Protein: 5.5 g/dL — ABNORMAL LOW (ref 6.5–8.1)

## 2016-07-03 LAB — GLUCOSE, CAPILLARY
Glucose-Capillary: 100 mg/dL — ABNORMAL HIGH (ref 65–99)
Glucose-Capillary: 193 mg/dL — ABNORMAL HIGH (ref 65–99)
Glucose-Capillary: 166 mg/dL — ABNORMAL HIGH (ref 65–99)
Glucose-Capillary: 228 mg/dL — ABNORMAL HIGH (ref 65–99)

## 2016-07-03 NOTE — Progress Notes (Signed)
Subjective/Complaints:   Objective: Vital Signs: Blood pressure (!) 164/84, pulse 64, temperature 98.4 F (36.9 C), temperature source Oral, resp. rate 19, height '6\' 2"'  (1.88 m), weight 96.8 kg (213 lb 4.8 oz), SpO2 94 %. No results found. Results for orders placed or performed during the hospital encounter of 07/02/16 (from the past 72 hour(s))  Glucose, capillary     Status: Abnormal   Collection Time: 07/02/16  4:32 PM  Result Value Ref Range   Glucose-Capillary 170 (H) 65 - 99 mg/dL  Glucose, capillary     Status: Abnormal   Collection Time: 07/02/16 10:04 PM  Result Value Ref Range   Glucose-Capillary 159 (H) 65 - 99 mg/dL  Glucose, capillary     Status: Abnormal   Collection Time: 07/03/16  6:20 AM  Result Value Ref Range   Glucose-Capillary 100 (H) 65 - 99 mg/dL  CBC WITH DIFFERENTIAL     Status: Abnormal   Collection Time: 07/03/16  6:24 AM  Result Value Ref Range   WBC 7.3 4.0 - 10.5 K/uL   RBC 4.05 (L) 4.22 - 5.81 MIL/uL   Hemoglobin 11.7 (L) 13.0 - 17.0 g/dL   HCT 36.6 (L) 39.0 - 52.0 %   MCV 90.4 78.0 - 100.0 fL   MCH 28.9 26.0 - 34.0 pg   MCHC 32.0 30.0 - 36.0 g/dL   RDW 15.5 11.5 - 15.5 %   Platelets 125 (L) 150 - 400 K/uL   Neutrophils Relative % 63 %   Neutro Abs 4.5 1.7 - 7.7 K/uL   Lymphocytes Relative 16 %   Lymphs Abs 1.2 0.7 - 4.0 K/uL   Monocytes Relative 6 %   Monocytes Absolute 0.5 0.1 - 1.0 K/uL   Eosinophils Relative 15 %   Eosinophils Absolute 1.1 (H) 0.0 - 0.7 K/uL   Basophils Relative 0 %   Basophils Absolute 0.0 0.0 - 0.1 K/uL  Comprehensive metabolic panel     Status: Abnormal   Collection Time: 07/03/16  6:24 AM  Result Value Ref Range   Sodium 138 135 - 145 mmol/L   Potassium 3.7 3.5 - 5.1 mmol/L   Chloride 103 101 - 111 mmol/L   CO2 27 22 - 32 mmol/L   Glucose, Bld 115 (H) 65 - 99 mg/dL   BUN 14 6 - 20 mg/dL   Creatinine, Ser 0.96 0.61 - 1.24 mg/dL   Calcium 8.4 (L) 8.9 - 10.3 mg/dL   Total Protein 5.5 (L) 6.5 - 8.1 g/dL   Albumin 3.1 (L) 3.5 - 5.0 g/dL   AST 29 15 - 41 U/L   ALT 33 17 - 63 U/L   Alkaline Phosphatase 88 38 - 126 U/L   Total Bilirubin 1.4 (H) 0.3 - 1.2 mg/dL   GFR calc non Af Amer >60 >60 mL/min   GFR calc Af Amer >60 >60 mL/min    Comment: (NOTE) The eGFR has been calculated using the CKD EPI equation. This calculation has not been validated in all clinical situations. eGFR's persistently <60 mL/min signify possible Chronic Kidney Disease.    Anion gap 8 5 - 15     HEENT: normal Cardio: RRR and no murmur Resp: CTA B/L and unlabored GI: BS positive and NT, ND Extremity:  No Edema Skin:   Intact Neuro: Alert/Oriented, Abnormal Motor 4/5 in BUE adn BLE except 3- R ankle DF, Abnormal FMC Ataxic/ dec FMC and Other Sitting balance is good Musc/Skel:  Other no pain with UE or LE ROM Gen  NAD   Assessment/Plan: 1. Functional deficits secondary to left frontal small vessel infarct which require 3+ hours per day of interdisciplinary therapy in a comprehensive inpatient rehab setting. Physiatrist is providing close team supervision and 24 hour management of active medical problems listed below. Physiatrist and rehab team continue to assess barriers to discharge/monitor patient progress toward functional and medical goals. FIM:       Function - Toileting Toileting steps completed by helper: Adjust clothing prior to toileting, Performs perineal hygiene, Adjust clothing after toileting  Function - Toilet Transfers Toilet transfer assistive device: Bedside commode Assist level to bedside commode (at bedside): Touching or steadying assistance (Pt > 75%) Assist level from bedside commode (at bedside): Touching or steadying assistance (Pt > 75%)  Function - Chair/bed transfer Chair/bed transfer activity did not occur: N/A     Function - Comprehension Comprehension: Auditory Comprehension assistive device: Hearing aids (Not available at this time) Comprehension assist level: Understands  basic 75 - 89% of the time/ requires cueing 10 - 24% of the time  Function - Expression Expression: Verbal Expression assist level: Expresses basic 75 - 89% of the time/requires cueing 10 - 24% of the time. Needs helper to occlude trach/needs to repeat words.  Function - Social Interaction Social Interaction assist level: Interacts appropriately 75 - 89% of the time - Needs redirection for appropriate language or to initiate interaction.  Function - Problem Solving Problem solving assist level: Solves basic 50 - 74% of the time/requires cueing 25 - 49% of the time  Function - Memory Memory assist level: Recognizes or recalls 75 - 89% of the time/requires cueing 10 - 24% of the time Patient normally able to recall (first 3 days only): That he or she is in a hospital, Staff names and faces, Current season  Medical Problem List and Plan: 1.  Gait disorder with ataxia secondary to left frontal lobe infarct Start CIR PT, OT, SLP   2.  DVT Prophylaxis/Anticoagulation: Eliquis and ASA 81m 3. Pain Management: Tylenol as needed 4. Mood: Wellbutrin 150 mg twice a day, Celexa 20 mg daily 5. Neuropsych: This patient is capable of making decisions on his own behalf. 6. Skin/Wound Care: Routine skin checks 7. Fluids/Electrolytes/Nutrition: Routine I&O with follow-up chemistries 8. CAD with history of CABG. Continue aspirin. No chest pain or shortness of breath 9. Atrial fibrillation. Cardiac rate controlled. Continue Eliquis 10. Hypertension. Imdur 30 mg daily, Bystolic 20 mg daily 11. Diabetes mellitus with peripheral neuropathy. Hemoglobin A1c 7.6. Lantus insulin 20 units daily. Check CBGs before meals and at bedtime. Diabetic teaching. Patient was on basalglar 20 units daily, Glucophage 1000 mg twice a day, Glucotrol 10 mg twice a day prior to admission.  CBG (last 3)   Recent Labs  07/02/16 1632 07/02/16 2204 07/03/16 0620  GLUCAP 170* 159* 100*       12. Hyperlipidemia. Lipitor  LOS  (Days) 1 A FACE TO FACE EVALUATION WAS PERFORMED  Sanja Elizardo E 07/03/2016, 8:08 AM

## 2016-07-03 NOTE — Evaluation (Signed)
Speech Language Pathology Assessment and Plan  Patient Details  Name: Luis Yu MRN: [id number] Date of Birth: 11/12/1945  SLP Diagnosis: Cognitive Impairments  Rehab Potential: Excellent ELOS: 3-5 days  Today's Date: 07/03/2016 SLP Individual Time: 0930-1030 SLP Individual Time Calculation (min): 60 min   Problem List:  Patient Active Problem List   Diagnosis Date Noted  . Frontal lobe and executive function deficit following cerebral infarction 07/02/2016  . Neurologic gait disorder   . Coronary artery disease involving coronary bypass graft of native heart without angina pectoris   . Benign essential HTN   . Acute kidney injury ([place])   . Nausea & vomiting 06/28/2016  . Ischemic stroke of frontal lobe ([place]) 06/28/2016  . Thrombocytopenia ([place]) 06/28/2016  . Renal failure (ARF), acute on chronic (HCC) 06/28/2016  . Stroke (cerebrum) ([place]) 06/28/2016  . Acute CVA (cerebrovascular accident) ([place]) 06/28/2016  . Insomnia 02/02/2016  . Atrial fibrillation ([place]) 10/27/2015  . Uncoordinated movements 02/24/2014  . Unspecified hereditary and idiopathic peripheral neuropathy 02/24/2013  . Low back pain 02/24/2013  . Abnormality of gait 02/09/2013  . Memory loss, short term 12/05/2011  . LUMBAR STRAIN, ACUTE 05/05/2010  . OSA (obstructive sleep apnea) 09/14/2009  . BPH (benign prostatic hyperplasia) 07/27/2009  . CARDIOVASCULAR STUDIES, ABNORMAL 07/27/2009  . CHEST PAIN 06/08/2009  . OSTEOARTHRITIS 05/30/2009  . HEEL PAIN, LEFT 05/24/2009  . Dyspnea on exertion 05/24/2009  . ADJUSTMENT DISORDER WITH MIXED FEATURES 08/25/2008  . Diabetes type 2, uncontrolled ([place]) 02/24/2008  . ERECTILE DYSFUNCTION 02/10/2007  . Hyperlipidemia 10/09/2006  . OBESITY 10/09/2006  . Essential hypertension 10/09/2006  . Coronary atherosclerosis 10/09/2006  . GERD 10/09/2006  . DM (diabetes mellitus) type II uncontrolled with retinopathy 06/10/2006   Past Medical History:  Past Medical  History:  Diagnosis Date  . Adjustment disorder with mixed emotional features   . Atrial flutter ([place])   . CAD (coronary artery disease)   . Depression   . Diabetes mellitus type II, uncontrolled ([place])   . Diabetic peripheral neuropathy ([place])   . Erectile dysfunction   . GERD (gastroesophageal reflux disease)   . Herpes zoster   . Hyperlipidemia   . Hypertension   . Obesity   . Osteoarthritis   . Postherpetic neuralgia   . Sleep apnea    Past Surgical History:  Past Surgical History:  Procedure Laterality Date  . CARDIOVERSION N/A 11/10/2015   Procedure: CARDIOVERSION;  Surgeon: Larey Dresser, MD;  Location: [place];  Service: Cardiovascular;  Laterality: N/A;  . CARDIOVERSION N/A 02/21/2016   Procedure: CARDIOVERSION;  Surgeon: Lelon Perla, MD;  Location: [facility] ENDOSCOPY;  Service: Cardiovascular;  Laterality: N/A;  . CORONARY ARTERY BYPASS GRAFT  1996   eith a LIMA to the LAD, saphenous vein graft to the acute marginal, saphenous vein graft to the PDA and saphenous vein graft to the circumflex.  [name] KNEE ARTHROSCOPY  05/2004   right knee  . REPLACEMENT TOTAL KNEE BILATERAL    . SHOULDER SURGERY      Assessment / Plan / Recommendation Clinical Impression Luis Yu a 71 y.o.right handed malewith history of bilateral TKA, CAD status post CABG, chronic atrial fibrillation maintained on aspirin and Eliquis, hypertension, diabetes mellitus. Per chart review patient lives with spouse. One level home 3 steps to entry. He was independent with a walker prior to admission. Presented 06/28/2016 after being referred to the ER by primary care physician after a fall while at the office with persistent nausea and  vomiting. In the ER CT of the head and neck was unremarkable and was planned for discharge to home. While waiting outside for his wife to pick him up patient with ongoing nausea and vomiting a repeat CT head reviewed, again negative. Blood pressure 130/110. MRI of the brain  showed punctate focus of acute ischemia within the posterior left frontal white matter without hemorrhage or mass effect. Chronic microvascular ischemia and old lacunar infarcts. MRA was unremarkable. Carotid Dopplers with mild to moderate heterogeneous plaque in bilateral carotid artery suggestive of less than 50% stenosis. Echocardiogram with ejection fraction of 60% no wall motion abnormalities. EEG was negative. Neurology follow-up patient remains on aspirin and Eliquisas prior to admission. Tolerating a regular consistency diet.Physical occupational therapy evaluations completed. Patient was admitted for comprehensive rehabilitation program.  Patient was admitted to [place] 07/02/16 and demonstrates baseline cognitive impairments that are now more significant per his report, which are characterized by impaired recall of new information as well as poor self-monitoring and correcting of errors with complex problem solving tasks.  This impacts the patient's overall safety with functional self-care tasks such as bill paying, which he was completing prior to admission.  Patient also demonstrates restlessness and poor anticipatory safety awareness.  Patient would benefit from skilled SLP intervention in order to maximize his functional independence prior to discharge. Anticipate patient will require 24 hour supervision at home.      Skilled Therapeutic Interventions          Montreal Cognitive Assessment, version 7.1 administered and patient demonstrated skills consistent with a score of 22/30 with 26 or better considered to be [name].  Skilled treatment initiated with focused on addressing cognition goals. SLP facilitated session by providing Min question cues to recall safety precautions and to return demonstration with use of call bell.  Plan for bill pay/check writing task tomorrow.  Continue with current plan of care.    SLP Assessment  Patient will need skilled [name]  [facility] during CIR admission    Recommendations  Oral Care Recommendations: Oral care BID Patient destination: Home Follow up Recommendations: 24 hour supervision/assistance;None Equipment Recommended: None recommended by SLP    SLP Frequency 3 to 5 out of 7 days   SLP Duration  SLP Intensity  SLP Treatment/Interventions    Minumum of 1-2 x/day, 30 to 90 minutes  Cognitive remediation/compensation;Cueing hierarchy;Functional tasks;Environmental controls;Internal/external aids;Patient/family education;Therapeutic Activities    Pain Pain Assessment Pain Assessment: No/denies pain  Prior Functioning Cognitive/Linguistic Baseline: Baseline deficits Type of Home: House  Lives With: Spouse Available Help at Discharge: Family;Available 24 hours/day Education: 10th grade Vocation: Retired (Administrator )  Function:  Cognition Comprehension Comprehension assist level: Follows basic conversation/direction with no assist  Expression   Expression assist level: Expresses basic needs/ideas: With extra time/assistive device  Social Interaction Social Interaction assist level: Interacts appropriately 75 - 89% of the time - Needs redirection for appropriate language or to initiate interaction.  Problem Solving Problem solving assist level: Solves basic 90% of the time/requires cueing < 10% of the time;Solves complex 90% of the time/cues < 10% of the time  Memory Memory assist level: Recognizes or recalls 75 - 89% of the time/requires cueing 10 - 24% of the time   Short Term Goals: Week 1: SLP Short Term Goal 1 (Week 1): STG = LTG due to length of stay   Refer to Care Plan for Long Term Goals  Recommendations for other services: None   Discharge Criteria: Patient will  be discharged from SLP if patient refuses treatment 3 consecutive times without medical reason, if treatment goals not met, if there is a change in medical status, if patient makes no progress towards goals or  if patient is discharged from hospital.  The above assessment, treatment plan, treatment alternatives and goals were discussed and mutually agreed upon: by patient  Gunnar Fusi, M.A., [place] 07/03/2016, 10:27 AM

## 2016-07-03 NOTE — Progress Notes (Signed)
Placed patient on BIPAP for the night with IPAP set at 12cm and EPAP set at 6cm

## 2016-07-03 NOTE — Telephone Encounter (Signed)
Ok to refill for one year  

## 2016-07-03 NOTE — Evaluation (Addendum)
Occupational Therapy Assessment and Plan  Patient Details  Name: Luis Yu MRN: [id number] Date of Birth: 11/25/1945  OT Diagnosis: ataxia and muscle weakness (generalized) Rehab Potential: Rehab Potential (ACUTE ONLY): Good ELOS: 3-5 days   Today's Date: 07/03/2016  Session 1 OT Individual Time: 0800-0900 OT Individual Time Calculation (min): 60 min     Session 2 OT Individual Time: 1400-1430 OT Individual Time Calculation (min): 30 min     Problem List:  Patient Active Problem List   Diagnosis Date Noted  . Frontal lobe and executive function deficit following cerebral infarction 07/02/2016  . Neurologic gait disorder   . Coronary artery disease involving coronary bypass graft of native heart without angina pectoris   . Benign essential HTN   . Acute kidney injury ([place])   . Nausea & vomiting 06/28/2016  . Ischemic stroke of frontal lobe ([place]) 06/28/2016  . Thrombocytopenia ([place]) 06/28/2016  . Renal failure (ARF), acute on chronic (HCC) 06/28/2016  . Stroke (cerebrum) ([place]) 06/28/2016  . Acute CVA (cerebrovascular accident) ([place]) 06/28/2016  . Insomnia 02/02/2016  . Atrial fibrillation ([place]) 10/27/2015  . Uncoordinated movements 02/24/2014  . Unspecified hereditary and idiopathic peripheral neuropathy 02/24/2013  . Low back pain 02/24/2013  . Abnormality of gait 02/09/2013  . Memory loss, short term 12/05/2011  . LUMBAR STRAIN, ACUTE 05/05/2010  . OSA (obstructive sleep apnea) 09/14/2009  . BPH (benign prostatic hyperplasia) 07/27/2009  . CARDIOVASCULAR STUDIES, ABNORMAL 07/27/2009  . CHEST PAIN 06/08/2009  . OSTEOARTHRITIS 05/30/2009  . HEEL PAIN, LEFT 05/24/2009  . Dyspnea on exertion 05/24/2009  . ADJUSTMENT DISORDER WITH MIXED FEATURES 08/25/2008  . Diabetes type 2, uncontrolled ([place]) 02/24/2008  . ERECTILE DYSFUNCTION 02/10/2007  . Hyperlipidemia 10/09/2006  . OBESITY 10/09/2006  . Essential hypertension 10/09/2006  . Coronary atherosclerosis 10/09/2006   . GERD 10/09/2006  . DM (diabetes mellitus) type II uncontrolled with retinopathy 06/10/2006    Past Medical History:  Past Medical History:  Diagnosis Date  . Adjustment disorder with mixed emotional features   . Atrial flutter ([place])   . CAD (coronary artery disease)   . Depression   . Diabetes mellitus type II, uncontrolled ([place])   . Diabetic peripheral neuropathy ([place])   . Erectile dysfunction   . GERD (gastroesophageal reflux disease)   . Herpes zoster   . Hyperlipidemia   . Hypertension   . Obesity   . Osteoarthritis   . Postherpetic neuralgia   . Sleep apnea    Past Surgical History:  Past Surgical History:  Procedure Laterality Date  . CARDIOVERSION N/A 11/10/2015   Procedure: CARDIOVERSION;  Surgeon: Larey Dresser, MD;  Location: [place];  Service: Cardiovascular;  Laterality: N/A;  . CARDIOVERSION N/A 02/21/2016   Procedure: CARDIOVERSION;  Surgeon: Lelon Perla, MD;  Location: [facility] ENDOSCOPY;  Service: Cardiovascular;  Laterality: N/A;  . CORONARY ARTERY BYPASS GRAFT  1996   eith a LIMA to the LAD, saphenous vein graft to the acute marginal, saphenous vein graft to the PDA and saphenous vein graft to the circumflex.  [name] KNEE ARTHROSCOPY  05/2004   right knee  . REPLACEMENT TOTAL KNEE BILATERAL    . SHOULDER SURGERY      Assessment & Plan Clinical Impression: Patient is a 71 y.o. year old male with recent admission to the hospital presenting after fall and persistent nausea and vomiting. MRI on 4/12 + for acute ischemia involving the L frontal lobe. PMHx: Atrial flutter, CAD, Depression, DM2, Diabetic peripheral neuropathy, GERD, Herpes zoster,  Hyperlipidemia, HTN, Obesity, Osteoarthritis, Sleep apnea, Cardioversion x2 in 2017, CABG 1996, bil TKA. Patient transferred to CIR on 07/02/2016 .    Patient currently requires min with basic self-care skills secondary to muscle weakness, impaired timing and sequencing and decreased coordination and decreased standing  balance and decreased balance strategies.  Prior to hospitalization, patient could complete ADL with modified independent .  Patient will benefit from skilled intervention to increase independence with basic self-care skills prior to discharge home with care partner.  Anticipate patient will require 24 hour supervision and follow up home health.  OT - End of Session Endurance Deficit: Yes Endurance Deficit Description: Multiple rest breaks during ADL session OT Assessment Rehab Potential (ACUTE ONLY): Good OT Patient demonstrates impairments in the following area(s): Balance;Endurance;Motor;Safety OT Basic ADL's Functional Problem(s): Grooming;Bathing;Dressing;Toileting OT Transfers Functional Problem(s): Toilet;Tub/Shower OT Additional Impairment(s): None OT Plan OT Intensity: Minimum of 1-2 x/day, 45 to 90 minutes OT Frequency: 5 out of 7 days OT Duration/Estimated Length of Stay: 3-5 days OT Treatment/Interventions: [employer] reintegration;Discharge planning;DME/adaptive equipment instruction;Functional mobility training;Neuromuscular re-education;Patient/family education;Self Care/advanced ADL retraining;Therapeutic Activities;Therapeutic Exercise;UE/LE Coordination activities;UE/LE Strength taining/ROM OT Basic Self-Care Anticipated Outcome(s): Mod I OT Toileting Anticipated Outcome(s): Mod I OT Bathroom Transfers Anticipated Outcome(s): Supervision OT Recommendation Patient destination: Home Follow Up Recommendations: Home health OT;24 hour supervision/assistance Equipment Recommended: To be determined   Skilled Therapeutic Intervention Session 1 OT eval completed addressing rehab process, OT purpose, POC, ELOS, and goals. Treatment provided with focus on modified bathing/dressing, standing balance, and activity tolerance. Pt greeted seated EOB finishing breakfast. Stand-pivot transfers bed>wc>tub bench with verbal cues for technique/hand placement and overall min A. Bathing  completed shower level with overall set-up and min A for standing balance 2/2 posterior lean when standing to wash bottom. Pt impulsive to stand and required VC to wait for therapist to lock wc prior to transferring out of shower. Dressing completed wc level at the sink with set-up and min A for standing balance when pulling pants over hips. Pt attempted to stand for toothbrushing task and required VC for safe sit<>stand technique and was unable to tolerate standing grooming and also had difficulty maintain standing balance w/o UE support in order to complete B UE task. Pt returned to sitting for further grooming activities. Safety belt placed on pt and reviewed fall risk and importance of using call bell for assistance.   Session 2 Pt eager to participate in therapy this afternoon. Treatment session focused on B UE coordination and sit<>stands.  Sup<>sit supervision, donned shoes with set-up A, then completed stand-pivot transfer to wc with min guard A. B UE coordination with wc propulsion and VC for technique. Sit<>stand w/ min guard A and min A standing balance while wc cushion placed.  OT then administered 9 hole peg test with noted 4 limb ataxia, exaggerated movement patterns of L and R shoulder with fine-motor demand of peg task as detailed below. Pt returned to room at end of session and left seated in wc with safety belt on and needs met.   L UE - 50.45 R UE - 43.24  OT Evaluation Precautions/Restrictions  Precautions Precautions: Fall Restrictions Weight Bearing Restrictions: No Pain Pain Assessment Pain Assessment: No/denies pain Home Living/Prior Functioning Home Living Family/patient expects to be discharged to:: Private residence Living Arrangements: Spouse/significant other Available Help at Discharge: Family, Available 24 hours/day Type of Home: House Home Access: Stairs to enter [employer] of Steps: 3 Home Layout: One level Bathroom Shower/Tub: Clinical cytogeneticist: Standard  Bathroom Accessibility: Yes  Lives With: Spouse IADL History Homemaking Responsibilities: No Current License: Yes Education: 10th grade Prior Function Level of Independence: Independent with basic ADLs, Independent with homemaking with ambulation, Requires assistive device for independence Agricultural consultant) Driving: Yes Vocation: Retired (Administrator ) Comments: Teaching laboratory technician for mobility in house, has built in Electronics engineer in walk-in shower ADL ADL ADL Comments: Please see functional navigator Vision/Perception  Vision- History Baseline Vision/History: Wears glasses Patient Visual Report: No change from baseline  Cognition Overall Cognitive Status: Impaired/Different from baseline Arousal/Alertness: Awake/alert Orientation Level: Person;Place;Situation Person: Oriented Place: Oriented Situation: Oriented Year: 2018 Month: April Day of Week: Correct Memory: Impaired Memory Impairment: Retrieval deficit;Decreased recall of new information Immediate Memory Recall: Sock;Bed;Blue Memory Recall:  (unable to recall with cues) Attention: Sustained Sustained Attention: Appears intact Awareness: Impaired Awareness Impairment: Anticipatory impairment Problem Solving: Impaired Problem Solving Impairment: Functional complex Executive Function: Self Monitoring;Self Correcting Self Monitoring: Impaired Self Monitoring Impairment: Functional complex Self Correcting: Impaired Self Correcting Impairment: Functional complex Behaviors: Restless;Impulsive Safety/Judgment: Impaired Comments: fall risk with histroy of multiple falls; reports memory is worse and wants to address it Sensation Sensation Light Touch: Appears Intact Coordination Fine Motor Movements are Fluid and Coordinated: No Finger Nose Finger Test: slight dysmetria Balance Balance Balance Assessed: Yes Dynamic Sitting Balance Sitting balance - Comments: Supervision Static Standing  Balance Static Standing - Balance Support: During functional activity Static Standing - Level of Assistance: 4: Min assist Dynamic Standing Balance Dynamic Standing - Balance Support: During functional activity Dynamic Standing - Level of Assistance: 4: Min assist;3: Mod assist (posterior LOB) Dynamic Standing - Balance Activities: Forward lean/weight shifting;Reaching for objects Extremity/Trunk Assessment RUE Assessment RUE Assessment: Within Functional Limits LUE Assessment LUE Assessment: Within Functional Limits   See Function Navigator for Current Functional Status.   Refer to Care Plan for Long Term Goals  Recommendations for other services: None    Discharge Criteria: Patient will be discharged from OT if patient refuses treatment 3 consecutive times without medical reason, if treatment goals not met, if there is a change in medical status, if patient makes no progress towards goals or if patient is discharged from hospital.  The above assessment, treatment plan, treatment alternatives and goals were discussed and mutually agreed upon: by patient  Valma Cava 07/03/2016, 3:54 PM

## 2016-07-03 NOTE — Progress Notes (Signed)
Patient information reviewed and entered into eRehab system by Tanysha Quant, RN, CRRN, PPS Coordinator.  Information including medical coding and functional independence measure will be reviewed and updated through discharge.     Per nursing patient was given "Data Collection Information Summary for Patients in Inpatient Rehabilitation Facilities with attached "Privacy Act Statement-Health Care Records" upon admission.  

## 2016-07-03 NOTE — Evaluation (Signed)
Physical Therapy Assessment and Plan  Patient Details  Name: Luis Yu MRN: [id number] Date of Birth: Jan 14, 1946  PT Diagnosis: Coordination disorder, Difficulty walking and Impaired cognition Rehab Potential: Good ELOS: 5-7 days   Today's Date: 07/03/2016 PT Individual Time: 5093-2671 PT Individual Time Calculation (min): 60 min    Problem List:  Patient Active Problem List   Diagnosis Date Noted  . Frontal lobe and executive function deficit following cerebral infarction 07/02/2016  . Neurologic gait disorder   . Coronary artery disease involving coronary bypass graft of native heart without angina pectoris   . Benign essential HTN   . Acute kidney injury ([place])   . Nausea & vomiting 06/28/2016  . Ischemic stroke of frontal lobe ([place]) 06/28/2016  . Thrombocytopenia ([place]) 06/28/2016  . Renal failure (ARF), acute on chronic (HCC) 06/28/2016  . Stroke (cerebrum) ([place]) 06/28/2016  . Acute CVA (cerebrovascular accident) ([place]) 06/28/2016  . Insomnia 02/02/2016  . Atrial fibrillation ([place]) 10/27/2015  . Uncoordinated movements 02/24/2014  . Unspecified hereditary and idiopathic peripheral neuropathy 02/24/2013  . Low back pain 02/24/2013  . Abnormality of gait 02/09/2013  . Memory loss, short term 12/05/2011  . LUMBAR STRAIN, ACUTE 05/05/2010  . OSA (obstructive sleep apnea) 09/14/2009  . BPH (benign prostatic hyperplasia) 07/27/2009  . CARDIOVASCULAR STUDIES, ABNORMAL 07/27/2009  . CHEST PAIN 06/08/2009  . OSTEOARTHRITIS 05/30/2009  . HEEL PAIN, LEFT 05/24/2009  . Dyspnea on exertion 05/24/2009  . ADJUSTMENT DISORDER WITH MIXED FEATURES 08/25/2008  . Diabetes type 2, uncontrolled ([place]) 02/24/2008  . ERECTILE DYSFUNCTION 02/10/2007  . Hyperlipidemia 10/09/2006  . OBESITY 10/09/2006  . Essential hypertension 10/09/2006  . Coronary atherosclerosis 10/09/2006  . GERD 10/09/2006  . DM (diabetes mellitus) type II uncontrolled with retinopathy 06/10/2006    Past Medical  History:  Past Medical History:  Diagnosis Date  . Adjustment disorder with mixed emotional features   . Atrial flutter ([place])   . CAD (coronary artery disease)   . Depression   . Diabetes mellitus type II, uncontrolled ([place])   . Diabetic peripheral neuropathy ([place])   . Erectile dysfunction   . GERD (gastroesophageal reflux disease)   . Herpes zoster   . Hyperlipidemia   . Hypertension   . Obesity   . Osteoarthritis   . Postherpetic neuralgia   . Sleep apnea    Past Surgical History:  Past Surgical History:  Procedure Laterality Date  . CARDIOVERSION N/A 11/10/2015   Procedure: CARDIOVERSION;  Surgeon: Larey Dresser, MD;  Location: [place];  Service: Cardiovascular;  Laterality: N/A;  . CARDIOVERSION N/A 02/21/2016   Procedure: CARDIOVERSION;  Surgeon: Lelon Perla, MD;  Location: [facility] ENDOSCOPY;  Service: Cardiovascular;  Laterality: N/A;  . CORONARY ARTERY BYPASS GRAFT  1996   eith a LIMA to the LAD, saphenous vein graft to the acute marginal, saphenous vein graft to the PDA and saphenous vein graft to the circumflex.  [name] KNEE ARTHROSCOPY  05/2004   right knee  . REPLACEMENT TOTAL KNEE BILATERAL    . SHOULDER SURGERY      Assessment & Plan Clinical Impression: Luis Yu is a 71 y.o. right handed male with history of bilateral TKA, CAD status post CABG, chronic atrial fibrillation maintained on aspirin and Eliquis, hypertension, diabetes mellitus.  Presented 06/28/2016 after being referred to the ER by primary care physician after a fall while at the office with persistent nausea and vomiting. In the ER CT of the head and neck was unremarkable was planned discharge  to home. While waiting outside for his wife to pick him up patient with ongoing nausea and vomiting a repeat CT of the head was again negative. Blood pressure 130/110. MRI of the brain showed punctate focus of acute ischemia within the posterior left frontal white matter without hemorrhage or mass effect. Chronic  microvascular ischemia and old lacunar infarcts. MRA was unremarkable. Carotid Dopplers with mild to moderate heterogeneous plaque in bilateral carotid artery suggestive of less than 50% stenosis. Echocardiogram pending. Neurology follow-up patient remains on aspirin and Eliquis as prior to admission. Tolerating a regular consistency diet. Patient transferred to CIR on 07/02/2016 .   Patient currently requires min with mobility secondary to muscle weakness, decreased cardiorespiratoy endurance, decreased attention, decreased awareness, decreased problem solving, decreased safety awareness and decreased memory and decreased standing balance, decreased postural control and decreased balance strategies.  Prior to hospitalization, patient was modified independent  with mobility and lived with Spouse in a House home.  Home access is 3Stairs to enter.  Patient will benefit from skilled PT intervention to maximize safe functional mobility, minimize fall risk and decrease caregiver burden for planned discharge home with 24 hour supervision.  Anticipate patient will benefit from follow up OP at discharge.  PT - End of Session Activity Tolerance: Tolerates 30+ min activity with multiple rests Endurance Deficit: Yes Endurance Deficit Description: multiple rest breaks due to SOB PT Assessment Rehab Potential (ACUTE/IP ONLY): Good Barriers to Discharge: Other (comment) (cognitive deficits, impulsivity) PT Patient demonstrates impairments in the following area(s): Balance;Endurance;Safety PT Transfers Functional Problem(s): Bed Mobility;Bed to Chair;Car;Furniture PT Locomotion Functional Problem(s): Stairs;Ambulation PT Plan PT Intensity: Minimum of 1-2 x/day ,45 to 90 minutes PT Frequency: 5 out of 7 days PT Duration Estimated Length of Stay: 5-7 days PT Treatment/Interventions: Ambulation/gait training;Community reintegration;DME/adaptive equipment instruction;Neuromuscular re-education;Psychosocial  support;Stair training;UE/LE Strength taining/ROM;Wheelchair propulsion/positioning;Balance/vestibular training;Discharge planning;Therapeutic Activities;UE/LE Coordination activities;Therapeutic Exercise;Patient/family education;Functional mobility training;Cognitive remediation/compensation PT Transfers Anticipated Outcome(s): supervision PT Locomotion Anticipated Outcome(s): supervision PT Recommendation Recommendations for Other Services: Neuropsych consult;Therapeutic Recreation consult Therapeutic Recreation Interventions: Pet therapy Follow Up Recommendations: Outpatient PT;24 hour supervision/assistance Patient destination: Home Equipment Recommended: None recommended by PT Equipment Details: has all DME already  Skilled Therapeutic Intervention No c/o pain.  PT provided education to pt and wife, Cathie Beams, on role of PT, goals of therapy, and plan of care.  PT instructed pt in safe functional transfers with RW, and ambulation.  Discussed d/c planning and likely need for 24/7 supervision at home 2/2 decreased recall and impulsivity.  Pt returned to room at end of session and positioned upright in w/c with QRB in place, call bell in reach and needs met.   PT Evaluation Precautions/Restrictions Precautions Precautions: Fall Precaution Comments: impulsive Restrictions Weight Bearing Restrictions: No Pain Pain Assessment Pain Assessment: No/denies pain Home Living/Prior Functioning Home Living Living Arrangements: Spouse/significant other Available Help at Discharge: Family;Available 24 hours/day Type of Home: House Home Access: Stairs to enter [employer] of Steps: 3 Entrance Stairs-Rails: Can reach both Home Layout: One level Bathroom Shower/Tub: Multimedia programmer: Standard Bathroom Accessibility: Yes  Lives With: Spouse Prior Function Level of Independence: Requires assistive device for independence  Able to Take Stairs?: Yes Driving: No Vocation:  Retired Comments: rollator at baseline Vision/Perception  Vision - Assessment Additional Comments: to be further assessed throughout stay Perception Comments: appears to be [facility], to be further assessed throughout LOS  Cognition Overall Cognitive Status: Impaired/Different from baseline Arousal/Alertness: Awake/alert Orientation Level: Oriented X4 Attention: Sustained Sustained Attention: Appears intact  Memory: Impaired Memory Impairment: Retrieval deficit;Decreased recall of new information Awareness: Impaired Awareness Impairment: Anticipatory impairment Problem Solving: Impaired Problem Solving Impairment: Functional complex Executive Function: Self Monitoring;Self Correcting Self Monitoring: Impaired Self Monitoring Impairment: Functional complex Self Correcting: Impaired Self Correcting Impairment: Functional complex Behaviors: Restless;Impulsive Safety/Judgment: Impaired Comments: fall risk with histroy of multiple falls; wife reports memory is not changed but pt feels it is worse Sensation Sensation Light Touch: Impaired Detail (intact per report but history of neuropathy) Light Touch Impaired Details:  (absent/decreased below ankle on R) Coordination Gross Motor Movements are Fluid and Coordinated: No Fine Motor Movements are Fluid and Coordinated: No Finger Nose Finger Test: slight dysmetria Motor  Motor Motor - Skilled Clinical Observations: generalized weakness and decreased balance  Mobility Transfers Transfers: Yes Sit to Stand: 4: Min guard Stand to Sit: 4: Min guard Stand Pivot Transfers: 4: Min assist Locomotion  Ambulation Ambulation: Yes Ambulation/Gait Assistance: 4: Min assist Ambulation Distance (Feet): 180 Feet Assistive device: Rolling walker Ambulation/Gait Assistance Details: Verbal cues for sequencing;Verbal cues for technique;Verbal cues for gait pattern;Verbal cues for safe use of DME/AE Gait Gait: Yes Gait Pattern: Impaired Gait  Pattern: Trendelenburg;Step-through pattern;Trunk flexed Wheelchair Mobility Wheelchair Mobility: Yes Wheelchair Assistance: 5: Careers information officer: Both lower extermities Wheelchair Parts Management: Needs assistance Distance: 150  Trunk/Postural Assessment  Cervical Assessment Cervical Assessment: Within Functional Limits Thoracic Assessment Thoracic Assessment: Within Functional Limits Lumbar Assessment Lumbar Assessment: Within Functional Limits Postural Control Postural Control: Deficits on evaluation Protective Responses: delayed Postural Limitations: decreased  Balance Balance Balance Assessed: Yes Dynamic Sitting Balance Sitting balance - Comments: Supervision Static Standing Balance Static Standing - Balance Support: During functional activity Static Standing - Level of Assistance: 4: Min assist Dynamic Standing Balance Dynamic Standing - Balance Support: During functional activity;Bilateral upper extremity supported Dynamic Standing - Level of Assistance: 4: Min assist Dynamic Standing - Balance Activities: Forward lean/weight shifting;Reaching for objects Extremity Assessment  RUE Assessment RUE Assessment: Within Functional Limits LUE Assessment LUE Assessment: Within Functional Limits RLE Assessment RLE Assessment: Exceptions to [facility] RLE Strength RLE Overall Strength Comments: 4+/5 throughout except dorsiflexion 3/5  LLE Assessment LLE Assessment: Within Functional Limits (4+/5 throughout)   See Function Navigator for Current Functional Status.   Refer to Care Plan for Long Term Goals  Recommendations for other services: Therapeutic Recreation  Pet therapy  Discharge Criteria: Patient will be discharged from PT if patient refuses treatment 3 consecutive times without medical reason, if treatment goals not met, if there is a change in medical status, if patient makes no progress towards goals or if patient is discharged from hospital.  The  above assessment, treatment plan, treatment alternatives and goals were discussed and mutually agreed upon: by patient  Urban Gibson E Penven-Crew 07/03/2016, 4:21 PM

## 2016-07-03 NOTE — Progress Notes (Signed)
Social Work Assessment and Plan Social Work Assessment and Plan  Patient Details  Name: Luis Yu MRN: [id number] Date of Birth: December 10, 1945  Today's Date: 07/03/2016  Problem List:  Patient Active Problem List   Diagnosis Date Noted  . Frontal lobe and executive function deficit following cerebral infarction 07/02/2016  . Neurologic gait disorder   . Coronary artery disease involving coronary bypass graft of native heart without angina pectoris   . Benign essential HTN   . Acute kidney injury (HCC)   . Nausea & vomiting 06/28/2016  . Ischemic stroke of frontal lobe (HCC) 06/28/2016  . Thrombocytopenia (HCC) 06/28/2016  . Renal failure (ARF), acute on chronic (HCC) 06/28/2016  . Stroke (cerebrum) (HCC) 06/28/2016  . Acute CVA (cerebrovascular accident) (HCC) 06/28/2016  . Insomnia 02/02/2016  . Atrial fibrillation (HCC) 10/27/2015  . Uncoordinated movements 02/24/2014  . Unspecified hereditary and idiopathic peripheral neuropathy 02/24/2013  . Low back pain 02/24/2013  . Abnormality of gait 02/09/2013  . Memory loss, short term 12/05/2011  . LUMBAR STRAIN, ACUTE 05/05/2010  . OSA (obstructive sleep apnea) 09/14/2009  . BPH (benign prostatic hyperplasia) 07/27/2009  . CARDIOVASCULAR STUDIES, ABNORMAL 07/27/2009  . CHEST PAIN 06/08/2009  . OSTEOARTHRITIS 05/30/2009  . HEEL PAIN, LEFT 05/24/2009  . Dyspnea on exertion 05/24/2009  . ADJUSTMENT DISORDER WITH MIXED FEATURES 08/25/2008  . Diabetes type 2, uncontrolled (HCC) 02/24/2008  . ERECTILE DYSFUNCTION 02/10/2007  . Hyperlipidemia 10/09/2006  . OBESITY 10/09/2006  . Essential hypertension 10/09/2006  . Coronary atherosclerosis 10/09/2006  . GERD 10/09/2006  . DM (diabetes mellitus) type II uncontrolled with retinopathy 06/10/2006   Past Medical History:  Past Medical History:  Diagnosis Date  . Adjustment disorder with mixed emotional features   . Atrial flutter (HCC)   . CAD (coronary artery disease)   .  Depression   . Diabetes mellitus type II, uncontrolled (HCC)   . Diabetic peripheral neuropathy (HCC)   . Erectile dysfunction   . GERD (gastroesophageal reflux disease)   . Herpes zoster   . Hyperlipidemia   . Hypertension   . Obesity   . Osteoarthritis   . Postherpetic neuralgia   . Sleep apnea    Past Surgical History:  Past Surgical History:  Procedure Laterality Date  . CARDIOVERSION N/A 11/10/2015   Procedure: CARDIOVERSION;  Surgeon: Laurey Morale, MD;  Location: [facility] ENDOSCOPY;  Service: Cardiovascular;  Laterality: N/A;  . CARDIOVERSION N/A 02/21/2016   Procedure: CARDIOVERSION;  Surgeon: Lewayne Bunting, MD;  Location: [facility] ENDOSCOPY;  Service: Cardiovascular;  Laterality: N/A;  . CORONARY ARTERY BYPASS GRAFT  1996   eith a LIMA to the LAD, saphenous vein graft to the acute marginal, saphenous vein graft to the PDA and saphenous vein graft to the circumflex.  [name] KNEE ARTHROSCOPY  05/2004   right knee  . REPLACEMENT TOTAL KNEE BILATERAL    . SHOULDER SURGERY     Social History:  reports that he has never smoked. He has never used smokeless tobacco. He reports that he drinks about 1.2 oz of alcohol per week . He reports that he does not use drugs.  Family / Support Systems Marital Status: Married Patient Roles: Spouse Spouse/Significant Other: [name] [phone] Other Supports: (P) friends and church members Anticipated Caregiver: Wife Ability/Limitations of Caregiver: No limitations Caregiver Availability: 24/7 Family Dynamics: Pt and wife are close and rely upon one another to get through life. Pt would like to get as independent as possible and his balance improve due to  this has been his main concern. They have friends and church members who are supportive.  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   Abuse/Neglect Physical Abuse: Denies Verbal Abuse: Denies Sexual Abuse: Denies Exploitation of patient/patient's resources: Denies Self-Neglect: Denies  Emotional Status Pt's affect, behavior adn adjustment status: Pt has been trying to manage but has been falling at home, he has recently fracutured his big toe which is still healing. He wants to be less of a burden to his wife, she worries about his falling and that he will hurt himself. Recent Psychosocial Issues: balance issues, depression and other medical issues Pyschiatric History: history of depression just started on celexa and he feels it is helping and he feels better. Will have neuro-psych see him while here for coping strategies.  Substance Abuse History: No issues  Patient / Family Perceptions, Expectations & Goals Pt/Family understanding of illness & functional limitations: Pt and wife can explain his stroke and deficits. He is glad to be here on rehab and feels this is the best place for him. He feels his questions and concerns are being addressed. Premorbid pt/family roles/activities: husband, retiree, church member, etc Anticipated changes in roles/activities/participation: resume Pt/family expectations/goals: Pt states: " I want to be as independent as I can be, get better balance."  Wife states: " I hope he can learn better balance while here."  [employer]: None Premorbid Home Care/DME Agencies: None Transportation available at discharge: Wife Resource referrals recommended: Neuropsychology, Support group (specify)  Discharge Planning Living Arrangements: Spouse/significant other Support Systems: Spouse/significant other Type of Residence: Private residence Community education officer Resources: [employer],  Media planner (specify) (mutual of [employer]) Surveyor, quantity Resources: Restaurant manager, fast food Screen Referred: No Living Expenses: Own Money Management: Patient, Spouse Does the patient have any problems obtaining your medications?: No Home Management: Wife Patient/Family Preliminary Plans: Return home with wife who can provide care. She plans to be here to observe him in therapies to see his progress. Will await team's evaluations and work on discharge plans. Social Work Anticipated Follow Up Needs: HH/OP, Support Group  Clinical Impression Pleasant very appreciatative gentleman who is ready to do well here. His wife is very supportive and willing to do what she needs to do to assist him. He is hopeful he will learn balance strategies and become more independent and safe With his ambulation. Will await team's evaluations and work on discharge plans. Will make referral for neuro-psych to see while here.  Lucy Chris 07/03/2016, 2:06 PM

## 2016-07-03 NOTE — Care Management Note (Signed)
Inpatient Rehabilitation Center Individual Statement of Services  Patient Name:  Luis Yu  Date:  07/03/2016  Welcome to the Inpatient Rehabilitation Center.  Our goal is to provide you with an individualized program based on your diagnosis and situation, designed to meet your specific needs.  With this comprehensive rehabilitation program, you will be expected to participate in at least 3 hours of rehabilitation therapies Monday-Friday, with modified therapy programming on the weekends.  Your rehabilitation program will include the following services:  Physical Therapy (PT), Occupational Therapy (OT), Speech Therapy (ST), 24 hour per day rehabilitation nursing, Case Management (Social Worker), Rehabilitation Medicine, Nutrition Services and Pharmacy Services  Weekly team conferences will be held on Wednesday to discuss your progress.  Your Social Worker will talk with you frequently to get your input and to update you on team discussions.  Team conferences with you and your family in attendance may also be held.  Expected length of stay: 5-7 days  Overall anticipated outcome: supervision level  Depending on your progress and recovery, your program may change. Your Social Worker will coordinate services and will keep you informed of any changes. Your Social Worker's name and contact numbers are listed  below.  The following services may also be recommended but are not provided by the Inpatient Rehabilitation Center:   Driving Evaluations  Home Health Rehabiltiation Services  Outpatient Rehabilitation Services    Arrangements will be made to provide these services after discharge if needed.  Arrangements include referral to agencies that provide these services.  Your insurance has been verified to be:  Medicare & [employer] Your primary doctor is:  Luis Yu  Pertinent information will be shared with your doctor and your insurance company.  Social Worker:  Dossie Der,  SW [phone] or ([phone]  Information discussed with and copy given to patient by: Lucy Chris, 07/03/2016, 9:51 AM

## 2016-07-04 ENCOUNTER — Inpatient Hospital Stay (HOSPITAL_COMMUNITY): Payer: Medicare Other | Admitting: Occupational Therapy

## 2016-07-04 ENCOUNTER — Inpatient Hospital Stay (HOSPITAL_COMMUNITY): Payer: Medicare Other | Admitting: Speech Pathology

## 2016-07-04 ENCOUNTER — Inpatient Hospital Stay (HOSPITAL_COMMUNITY): Payer: Medicare Other | Admitting: Physical Therapy

## 2016-07-04 ENCOUNTER — Other Ambulatory Visit: Payer: Medicare Other

## 2016-07-04 LAB — GLUCOSE, CAPILLARY
Glucose-Capillary: 143 mg/dL — ABNORMAL HIGH (ref 65–99)
Glucose-Capillary: 169 mg/dL — ABNORMAL HIGH (ref 65–99)
Glucose-Capillary: 354 mg/dL — ABNORMAL HIGH (ref 65–99)
Glucose-Capillary: 192 mg/dL — ABNORMAL HIGH (ref 65–99)

## 2016-07-04 MED ORDER — METFORMIN HCL 500 MG PO TABS
500.0000 mg | ORAL_TABLET | Freq: Every day | ORAL | Status: DC
  Administered 2016-07-04 – 2016-07-05 (×2): 500 mg via ORAL
  Filled 2016-07-04 (×2): qty 1

## 2016-07-04 NOTE — Progress Notes (Signed)
Social Work Patient ID: Luis Yu, male   DOB: Apr 27, 1945, 71 y.o.   MRN: [id number]  Met with pt and spoke with wife via telephone to discuss team conference goals supervision level due to impulsivity and safety. [employer] discharge date is 4/21. Wife reports she was hoping he would stay here longer due to his balance issues, his memory issues are not new. Have scheduled her to come in tomorrow at 2:00 for education. Will see then and discuss discharge needs.

## 2016-07-04 NOTE — Progress Notes (Signed)
Pt. on BiPAP for h/s tolerating well.

## 2016-07-04 NOTE — Progress Notes (Signed)
Speech Language Pathology Daily Session Note  Patient Details  Name: Luis Yu MRN: [id number] Date of Birth: 1945/07/24  Today's Date: 07/04/2016 SLP Individual Time: 0815-0900 SLP Individual Time Calculation (min): 45 min  Short Term Goals: Week 1: SLP Short Term Goal 1 (Week 1): STG = LTG due to length of stay   Skilled Therapeutic Interventions: Skilled treatment session focused on cognitive goals. SLP facilitated session by providing Max A verbal cues for safety awareness in regards to utilizing the call bell to request assistance to use the bathroom, etc. Patient also required Max A verbal cues to recall events from previous therapy sessions and to utilize schedule to anticipate upcoming therapy sessions. Patient left upright in bed with all needs within reach. Continue with current plan of care.      Function:   Cognition Comprehension Comprehension assist level: Follows basic conversation/direction with extra time/assistive device  Expression   Expression assist level: Expresses basic needs/ideas: With extra time/assistive device  Social Interaction Social Interaction assist level: Interacts appropriately 90% of the time - Needs monitoring or encouragement for participation or interaction.  Problem Solving Problem solving assist level: Solves basic 90% of the time/requires cueing < 10% of the time;Solves complex 90% of the time/cues < 10% of the time  Memory Memory assist level: Recognizes or recalls 75 - 89% of the time/requires cueing 10 - 24% of the time    Pain Pain Assessment Pain Assessment: No/denies pain  Therapy/Group: Individual Therapy  Laketta Soderberg 07/04/2016, 3:47 PM

## 2016-07-04 NOTE — Progress Notes (Signed)
Physical Therapy Session Note  Patient Details  Name: Luis Yu MRN: [id number] Date of Birth: 1945-10-17  Today's Date: 07/04/2016 PT Individual Time: 1415-1530 PT Individual Time Calculation (min): 75 min   Short Term Goals: Week 1:  PT Short Term Goal 1 (Week 1): =LTGs due to ELOS  Skilled Therapeutic Interventions/Progress Updates:    no c/o pain.  Session focus on organization, problem solving, attention to task, balance, activity tolerance, ambulation, and w/c mobility.    Pt propels w/c throughout unit with occasional short rest break for fatigue.  PT administered the [facility] Scale and patient demonstrates high fall risk as noted by score of 32/56 on Berg Balance Scale.  (<36= high risk for falls, close to 100%; 37-45 significant >80%; 46-51 moderate >50%; 52-55 lower >25%).  Recommending 24/7 supervision at home and use of RW at all times to maximize safety and decrease fall risk.  Pt engaged in peg board activity focus on organization, problem solving, attention, and balance.  Pt completed 3 designs with pegs in sitting, standing, and standing on foam wedge all within 2-2.5 minutes with supervision.  Pt able to recognize and self correct errors made.  Gait training x125' with RW and min guard with verbal cues for walker positioning and pacing.  Educated patient on increased fall risk with increased haste and pt verbalized understanding but education to continue to ensure maximum carryover.  Pt returned to room at end of session, completed toilet transfer and 3/3 toileting steps with supervision.  Returned to bed with supervision and positioned with call bell in reach, alarm activated, and needs met.    Therapy Documentation Precautions:  Precautions Precautions: Fall Precaution Comments: impulsive Restrictions Weight Bearing Restrictions: No Balance: Balance Balance Assessed: Yes Standardized Balance Assessment Standardized Balance Assessment: Berg Balance Test Berg  Balance Test Sit to Stand: Able to stand without using hands and stabilize independently Standing Unsupported: Able to stand safely 2 minutes Sitting with Back Unsupported but Feet Supported on Floor or Stool: Able to sit safely and securely 2 minutes Stand to Sit: Controls descent by using hands Transfers: Able to transfer safely, definite need of hands Standing Unsupported with Eyes Closed: Able to stand 10 seconds with supervision Standing Ubsupported with Feet Together: Needs help to attain position but able to stand for 30 seconds with feet together From Standing, Reach Forward with Outstretched Arm: Reaches forward but needs supervision From Standing Position, Pick up Object from Floor: Able to pick up shoe safely and easily From Standing Position, Turn to Look Behind Over each Shoulder: Turn sideways only but maintains balance Turn 360 Degrees: Able to turn 360 degrees safely but slowly Standing Unsupported, Alternately Place Feet on Step/Stool: Able to complete >2 steps/needs minimal assist Standing Unsupported, One Foot in Front: Loses balance while stepping or standing Standing on One Leg: Unable to try or needs assist to prevent fall Total Score: 32   See Function Navigator for Current Functional Status.   Therapy/Group: Individual Therapy  Earnest Conroy Penven-Crew 07/04/2016, 2:46 PM

## 2016-07-04 NOTE — Progress Notes (Signed)
Occupational Therapy Session Note  Patient Details  Name: Luis Yu MRN: [id number] Date of Birth: 1945-03-30  Today's Date: 07/04/2016 OT Individual Time: 0900-1000 OT Individual Time Calculation (min): 60 min    Short Term Goals: Week 1:  OT Short Term Goal 1 (Week 1): LTG=STG 2/2 estimated short LOS  Skilled Therapeutic Interventions/Progress Updates:    OT treatment session focused on standing balance/endurance, activity tolerance, safety awareness, and shower transfers. Pt greeted in bed and eager to participate in OT. Pt transferred to sitting EOB with supervision, then donned shorts with supervision and min A sit<>stabnd w/ slight posterior LOB when B UE removed from RW to pull up pants. Pt ambulated to therapy gym with 1 seated rest break and min guard A. Standing balance therapeutic activity using foam platform while participating in fine-motor peg board puzzle. Facilitated anterior weight shift and hip ankle balance strategies. Pt fatigued quickly in standing tolerating ~ 2 minutes at longest standing bout. Discussed home bathriooim set-up and completed simulated walk-in shower transfer with min A and VC for safe technique.    Therapy Documentation Precautions:  Precautions Precautions: Fall Precaution Comments: impulsive Restrictions Weight Bearing Restrictions: No Pain: Pain Assessment Pain Assessment: No/denies pain ADL: ADL ADL Comments: Please see functional navigator  See Function Navigator for Current Functional Status.   Therapy/Group: Individual Therapy  Mal Amabile 07/04/2016, 9:24 AM

## 2016-07-04 NOTE — Progress Notes (Signed)
Social Work Lucy Chris, LCSW Social Worker Signed   Patient Care Conference Date of Service: 07/04/2016  3:20 PM      Hide copied text Hover for attribution information Inpatient RehabilitationTeam Conference and Plan of Care Update Date: 07/04/2016   Time: 11:30 AM      Patient Name: Luis Yu      Medical Record Number: [id number]  Date of Birth: [birth year] Sex: Male         Room/Bed: 4W25C/4W25C-01 Payor Info: Payor: MEDICARE / Plan: MEDICARE PART A AND B / Product Type: *No Product type* /     Admitting Diagnosis: L CVA  Admit Date/Time:  07/02/2016  3:06 PM Admission Comments: No comment available    Primary Diagnosis:  <principal problem not specified> Principal Problem: <principal problem not specified>       Patient Active Problem List    Diagnosis Date Noted  . Frontal lobe and executive function deficit following cerebral infarction 07/02/2016  . Neurologic gait disorder    . Coronary artery disease involving coronary bypass graft of native heart without angina pectoris    . Benign essential HTN    . Acute kidney injury (HCC)    . Nausea & vomiting 06/28/2016  . Ischemic stroke of frontal lobe (HCC) 06/28/2016  . Thrombocytopenia (HCC) 06/28/2016  . Renal failure (ARF), acute on chronic (HCC) 06/28/2016  . Stroke (cerebrum) (HCC) 06/28/2016  . Acute CVA (cerebrovascular accident) (HCC) 06/28/2016  . Insomnia 02/02/2016  . Atrial fibrillation (HCC) 10/27/2015  . Uncoordinated movements 02/24/2014  . Unspecified hereditary and idiopathic peripheral neuropathy 02/24/2013  . Low back pain 02/24/2013  . Abnormality of gait 02/09/2013  . Memory loss, short term 12/05/2011  . LUMBAR STRAIN, ACUTE 05/05/2010  . OSA (obstructive sleep apnea) 09/14/2009  . BPH (benign prostatic hyperplasia) 07/27/2009  . CARDIOVASCULAR STUDIES, ABNORMAL 07/27/2009  . CHEST PAIN 06/08/2009  . OSTEOARTHRITIS 05/30/2009  . HEEL PAIN, LEFT 05/24/2009  . Dyspnea on exertion  05/24/2009  . ADJUSTMENT DISORDER WITH MIXED FEATURES 08/25/2008  . Diabetes type 2, uncontrolled (HCC) 02/24/2008  . ERECTILE DYSFUNCTION 02/10/2007  . Hyperlipidemia 10/09/2006  . OBESITY 10/09/2006  . Essential hypertension 10/09/2006  . Coronary atherosclerosis 10/09/2006  . GERD 10/09/2006  . DM (diabetes mellitus) type II uncontrolled with retinopathy 06/10/2006      Expected Discharge Date: Expected Discharge Date: 07/07/16   Team Members Present: Physician leading conference: Dr. Claudette Laws Social Worker Present: Dossie Der, LCSW Nurse Present: Ronny Bacon, RN PT Present: Teodoro Kil, PT OT Present: Kearney Hard, OT SLP Present: Feliberto Gottron, SLP PPS Coordinator present : Tora Duck, RN, CRRN       Current Status/Progress Goal Weekly Team Focus  Medical     Incont bowel and bladder, poor safety awareness  increased safety awareness  d/c planning   Bowel/Bladder     continent of B/B; LBM 4/17  maintain continence  monitor for changes in continence   Swallow/Nutrition/ Hydration               ADL's     Min A/supervision   24/7 supervision   safety awareness, ADL training, pt/family ed.   Mobility     min guard overall   supervision       Communication               Safety/Cognition/ Behavioral Observations   Min A for recall and supervision for basic problem solving   Mod I   complex problem solving,  anticipatory awareness, recall   Pain     occasional c/o headache; tylenol   <3  monitor for nonverbal cues for pain and treat accordingly   Skin     scattered bruising  free of infection and skin breakdown  assess for changes in skin integrity     *See Care Plan and progress notes for long and short-term goals.   Barriers to Discharge: see above     Possible Resolutions to Barriers:  establish toileting program     Discharge Planning/Teaching Needs:  Home with wife who can provide assistance, pt so gald to be here.      Team  Discussion:  Goals supervision level due to impulsivity and safety issues. At times needs cueing and re-direction-usually later in the afternoon. Question if sundowning. Need wife to come in for education. Medically stable according to MD  Revisions to Treatment Plan:  DC 4/21    Continued Need for Acute Rehabilitation Level of Care: The patient requires daily medical management by a physician with specialized training in physical medicine and rehabilitation for the following conditions: Daily direction of a multidisciplinary physical rehabilitation program to ensure safe treatment while eliciting the highest outcome that is of practical value to the patient.: Yes Daily medical management of patient stability for increased activity during participation in an intensive rehabilitation regime.: Yes Daily analysis of laboratory values and/or radiology reports with any subsequent need for medication adjustment of medical intervention for : Neurological problems   Lucy Chris 07/04/2016, 3:20 PM       Patient ID: Doretha Imus, male   DOB: 11-Oct-1945, 71 y.o.   MRN: [id number]

## 2016-07-04 NOTE — Progress Notes (Signed)
Subjective/Complaints: No issue sovernite very motivated  ROS no CP SOB, -N/V/D  Objective: Vital Signs: Blood pressure (!) 150/92, pulse 62, temperature 97.6 F (36.4 C), temperature source Oral, resp. rate 18, height '6\' 2"'  (1.88 m), weight 96.8 kg (213 lb 4.8 oz), SpO2 97 %. No results found. Results for orders placed or performed during the hospital encounter of 07/02/16 (from the past 72 hour(s))  Glucose, capillary     Status: Abnormal   Collection Time: 07/02/16  4:32 PM  Result Value Ref Range   Glucose-Capillary 170 (H) 65 - 99 mg/dL  Glucose, capillary     Status: Abnormal   Collection Time: 07/02/16 10:04 PM  Result Value Ref Range   Glucose-Capillary 159 (H) 65 - 99 mg/dL  Glucose, capillary     Status: Abnormal   Collection Time: 07/03/16  6:20 AM  Result Value Ref Range   Glucose-Capillary 100 (H) 65 - 99 mg/dL  CBC WITH DIFFERENTIAL     Status: Abnormal   Collection Time: 07/03/16  6:24 AM  Result Value Ref Range   WBC 7.3 4.0 - 10.5 K/uL   RBC 4.05 (L) 4.22 - 5.81 MIL/uL   Hemoglobin 11.7 (L) 13.0 - 17.0 g/dL   HCT 36.6 (L) 39.0 - 52.0 %   MCV 90.4 78.0 - 100.0 fL   MCH 28.9 26.0 - 34.0 pg   MCHC 32.0 30.0 - 36.0 g/dL   RDW 15.5 11.5 - 15.5 %   Platelets 125 (L) 150 - 400 K/uL   Neutrophils Relative % 63 %   Neutro Abs 4.5 1.7 - 7.7 K/uL   Lymphocytes Relative 16 %   Lymphs Abs 1.2 0.7 - 4.0 K/uL   Monocytes Relative 6 %   Monocytes Absolute 0.5 0.1 - 1.0 K/uL   Eosinophils Relative 15 %   Eosinophils Absolute 1.1 (H) 0.0 - 0.7 K/uL   Basophils Relative 0 %   Basophils Absolute 0.0 0.0 - 0.1 K/uL  Comprehensive metabolic panel     Status: Abnormal   Collection Time: 07/03/16  6:24 AM  Result Value Ref Range   Sodium 138 135 - 145 mmol/L   Potassium 3.7 3.5 - 5.1 mmol/L   Chloride 103 101 - 111 mmol/L   CO2 27 22 - 32 mmol/L   Glucose, Bld 115 (H) 65 - 99 mg/dL   BUN 14 6 - 20 mg/dL   Creatinine, Ser 0.96 0.61 - 1.24 mg/dL   Calcium 8.4 (L) 8.9  - 10.3 mg/dL   Total Protein 5.5 (L) 6.5 - 8.1 g/dL   Albumin 3.1 (L) 3.5 - 5.0 g/dL   AST 29 15 - 41 U/L   ALT 33 17 - 63 U/L   Alkaline Phosphatase 88 38 - 126 U/L   Total Bilirubin 1.4 (H) 0.3 - 1.2 mg/dL   GFR calc non Af Amer >60 >60 mL/min   GFR calc Af Amer >60 >60 mL/min    Comment: (NOTE) The eGFR has been calculated using the CKD EPI equation. This calculation has not been validated in all clinical situations. eGFR's persistently <60 mL/min signify possible Chronic Kidney Disease.    Anion gap 8 5 - 15  Glucose, capillary     Status: Abnormal   Collection Time: 07/03/16 12:01 PM  Result Value Ref Range   Glucose-Capillary 193 (H) 65 - 99 mg/dL  Glucose, capillary     Status: Abnormal   Collection Time: 07/03/16  5:09 PM  Result Value Ref Range   Glucose-Capillary  166 (H) 65 - 99 mg/dL  Glucose, capillary     Status: Abnormal   Collection Time: 07/03/16 10:31 PM  Result Value Ref Range   Glucose-Capillary 228 (H) 65 - 99 mg/dL  Glucose, capillary     Status: Abnormal   Collection Time: 07/04/16  5:57 AM  Result Value Ref Range   Glucose-Capillary 143 (H) 65 - 99 mg/dL     HEENT: normal Cardio: RRR and no murmur Resp: CTA B/L and unlabored GI: BS positive and NT, ND Extremity:  No Edema Skin:   Intact Neuro: Alert/Oriented, Abnormal Motor 4/5 in BUE adn BLE except 3- R ankle DF, Abnormal FMC Ataxic/ dec FMC and Other Sitting balance is good Musc/Skel:  Other no pain with UE or LE ROM Gen NAD   Assessment/Plan: 1. Functional deficits secondary to left frontal small vessel infarct which require 3+ hours per day of interdisciplinary therapy in a comprehensive inpatient rehab setting. Physiatrist is providing close team supervision and 24 hour management of active medical problems listed below. Physiatrist and rehab team continue to assess barriers to discharge/monitor patient progress toward functional and medical goals. FIM: Function - Bathing Position:  Shower Body parts bathed by patient: Left arm, Right arm, Chest, Abdomen, Front perineal area, Buttocks, Right upper leg, Left upper leg Body parts bathed by helper: Right lower leg, Left lower leg, Back Assist Level: Touching or steadying assistance(Pt > 75%)  Function- Upper Body Dressing/Undressing What is the patient wearing?: Pull over shirt/dress Pull over shirt/dress - Perfomed by patient: Thread/unthread left sleeve, Thread/unthread right sleeve, Put head through opening Pull over shirt/dress - Perfomed by helper: Pull shirt over trunk Assist Level: Touching or steadying assistance(Pt > 75%) Function - Lower Body Dressing/Undressing What is the patient wearing?: Pants, Underwear, Socks, Shoes Position: Wheelchair/chair at [employer] - Performed by patient: Thread/unthread right underwear leg, Thread/unthread left underwear leg Underwear - Performed by helper: Pull underwear up/down Pants- Performed by patient: Thread/unthread right pants leg, Thread/unthread left pants leg Pants- Performed by helper: Pull pants up/down Socks - Performed by patient: Don/doff right sock, Don/doff left sock Shoes - Performed by patient: Don/doff left shoe, Don/doff right shoe, Fasten right, Fasten left (Velcro) Assist for footwear: Supervision/touching assist Assist for lower body dressing: Touching or steadying assistance (Pt > 75%)  Function - Toileting Toileting steps completed by helper: Adjust clothing prior to toileting, Performs perineal hygiene, Adjust clothing after toileting  Function - Toilet Transfers Toilet transfer assistive device: Elevated toilet seat/BSC over toilet, Grab bar Assist level to toilet: Touching or steadying assistance (Pt > 75%) Assist level from toilet: Touching or steadying assistance (Pt > 75%) Assist level to bedside commode (at bedside): Touching or steadying assistance (Pt > 75%) Assist level from bedside commode (at bedside): Touching or steadying  assistance (Pt > 75%)  Function - Chair/bed transfer Chair/bed transfer activity did not occur: N/A Chair/bed transfer method: Stand pivot Chair/bed transfer assist level: Touching or steadying assistance (Pt > 75%) Chair/bed transfer assistive device: Armrests, Walker Chair/bed transfer details: Verbal cues for safe use of DME/AE  Function - Locomotion: Wheelchair Will patient use wheelchair at discharge?: No Max wheelchair distance: 150 Assist Level: Supervision or verbal cues Assist Level: Supervision or verbal cues Assist Level: Supervision or verbal cues Turns around,maneuvers to table,bed, and toilet,negotiates 3% grade,maneuvers on rugs and over doorsills: No Function - Locomotion: Ambulation Assistive device: Walker-rolling Max distance: 180 Assist level: Touching or steadying assistance (Pt > 75%) Assist level: Touching or steadying assistance (Pt >  75%) Assist level: Touching or steadying assistance (Pt > 75%) Assist level: Touching or steadying assistance (Pt > 75%)  Function - Comprehension Comprehension: Auditory Comprehension assistive device: Hearing aids Comprehension assist level: Follows basic conversation/direction with extra time/assistive device  Function - Expression Expression: Verbal Expression assist level: Expresses basic needs/ideas: With extra time/assistive device  Function - Social Interaction Social Interaction assist level: Interacts appropriately 90% of the time - Needs monitoring or encouragement for participation or interaction.  Function - Problem Solving Problem solving assist level: Solves basic 90% of the time/requires cueing < 10% of the time, Solves complex 90% of the time/cues < 10% of the time  Function - Memory Memory assist level: Recognizes or recalls 75 - 89% of the time/requires cueing 10 - 24% of the time Patient normally able to recall (first 3 days only): That he or she is in a hospital, Staff names and faces, Current  season  Medical Problem List and Plan: 1.  Gait disorder with ataxia secondary to left frontal lobe infarct Team conference today please see physician documentation under team conference tab, met with team face-to-face to discuss problems,progress, and goals. Formulized individual treatment plan based on medical history, underlying problem and comorbidities.   2.  DVT Prophylaxis/Anticoagulation: Eliquis and ASA 54m 3. Pain Management: Tylenol as needed 4. Mood: Wellbutrin 150 mg twice a day, Celexa 20 mg daily 5. Neuropsych: This patient is capable of making decisions on his own behalf. 6. Skin/Wound Care: Routine skin checks 7. Fluids/Electrolytes/Nutrition: Routine I&O with follow-up chemistries 8. CAD with history of CABG. Continue aspirin. No chest pain or shortness of breath 9. Atrial fibrillation. Cardiac rate controlled. Continue Eliquis 10. Hypertension. Imdur 30 mg daily, Bystolic 20 mg daily 11. Diabetes mellitus with peripheral neuropathy. Hemoglobin A1c 7.6. Lantus insulin 20 units daily. Check CBGs before meals and at bedtime. Diabetic teaching. Glucophage 1000 mg twice a day, Glucotrol 10 mg twice a day prior to admission. Restart metformin 5058mCBG (last 3)   Recent Labs  07/03/16 1709 07/03/16 2231 07/04/16 0557  GLUCAP 166* 228* 143*       12. Hyperlipidemia. Lipitor  LOS (Days) 2 A FACE TO FACE EVALUATION WAS PERFORMED  Ivey Nembhard E 07/04/2016, 7:57 AM

## 2016-07-04 NOTE — Progress Notes (Signed)
Occupational Therapy Session Note  Patient Details  Name: Luis Yu MRN: [id number] Date of Birth: July 04, 1945  Today's Date: 07/04/2016 OT Individual Time: 1039-1110 OT Individual Time Calculation (min): 31 min    Short Term Goals: Week 1:  OT Short Term Goal 1 (Week 1): LTG=STG 2/2 estimated short LOS  Skilled Therapeutic Interventions/Progress Updates:    Pt seen this session to work on R side coordination, balance. Pt received in bed and agreeable to working on ambulation with RW to gym. Min A at hips to stabilize balance as he tended to drift body towards R side of RW not staying in the frame. Min cues to fully advance R foot. Once he arrived to the gym, he was out of breath. Rested until his breathing relaxed. Oxygen sats 98%, HR 72.  Pt worked on postural exercises with rows using weighted bar and lifting through sternum. Sit ><stand from mat with hands pushing up then reaching for RW. Worked on controlled descent. Pt feeling fatigued so returned to room in w/c.  Pt requested to get back in bed. Transferred with RW with min A. Bed alarm set and all needs met.  Therapy Documentation Precautions:  Precautions Precautions: Fall Precaution Comments: impulsive Restrictions Weight Bearing Restrictions: No  Pain: Pain Assessment Pain Assessment: No/denies pain Pain Score: 0-No pain ADL: ADL ADL Comments: Please see functional navigator  See Function Navigator for Current Functional Status.   Therapy/Group: Individual Therapy  [place] 07/04/2016, 10:15 AM

## 2016-07-04 NOTE — Patient Care Conference (Signed)
Inpatient RehabilitationTeam Conference and Plan of Care Update Date: 07/04/2016   Time: 11:30 AM    Patient Name: Luis Yu      Medical Record Number: [id number]  Date of Birth: 1945-03-23 Sex: Male         Room/Bed: 4W25C/4W25C-01 Payor Info: Payor: MEDICARE / Plan: MEDICARE PART A AND B / Product Type: *No Product type* /    Admitting Diagnosis: L CVA  Admit Date/Time:  07/02/2016  3:06 PM Admission Comments: No comment available   Primary Diagnosis:  <principal problem not specified> Principal Problem: <principal problem not specified>  Patient Active Problem List   Diagnosis Date Noted  . Frontal lobe and executive function deficit following cerebral infarction 07/02/2016  . Neurologic gait disorder   . Coronary artery disease involving coronary bypass graft of native heart without angina pectoris   . Benign essential HTN   . Acute kidney injury (HCC)   . Nausea & vomiting 06/28/2016  . Ischemic stroke of frontal lobe (HCC) 06/28/2016  . Thrombocytopenia (HCC) 06/28/2016  . Renal failure (ARF), acute on chronic (HCC) 06/28/2016  . Stroke (cerebrum) (HCC) 06/28/2016  . Acute CVA (cerebrovascular accident) (HCC) 06/28/2016  . Insomnia 02/02/2016  . Atrial fibrillation (HCC) 10/27/2015  . Uncoordinated movements 02/24/2014  . Unspecified hereditary and idiopathic peripheral neuropathy 02/24/2013  . Low back pain 02/24/2013  . Abnormality of gait 02/09/2013  . Memory loss, short term 12/05/2011  . LUMBAR STRAIN, ACUTE 05/05/2010  . OSA (obstructive sleep apnea) 09/14/2009  . BPH (benign prostatic hyperplasia) 07/27/2009  . CARDIOVASCULAR STUDIES, ABNORMAL 07/27/2009  . CHEST PAIN 06/08/2009  . OSTEOARTHRITIS 05/30/2009  . HEEL PAIN, LEFT 05/24/2009  . Dyspnea on exertion 05/24/2009  . ADJUSTMENT DISORDER WITH MIXED FEATURES 08/25/2008  . Diabetes type 2, uncontrolled (HCC) 02/24/2008  . ERECTILE DYSFUNCTION 02/10/2007  . Hyperlipidemia 10/09/2006  . OBESITY  10/09/2006  . Essential hypertension 10/09/2006  . Coronary atherosclerosis 10/09/2006  . GERD 10/09/2006  . DM (diabetes mellitus) type II uncontrolled with retinopathy 06/10/2006    Expected Discharge Date: Expected Discharge Date: 07/07/16  Team Members Present: Physician leading conference: Dr. Claudette Laws Social Worker Present: Dossie Der, LCSW Nurse Present: Ronny Bacon, RN PT Present: Teodoro Kil, PT OT Present: Kearney Hard, OT SLP Present: Feliberto Gottron, SLP PPS Coordinator present : Tora Duck, RN, CRRN     Current Status/Progress Goal Weekly Team Focus  Medical   Incont bowel and bladder, poor safety awareness  increased safety awareness  d/c planning   Bowel/Bladder   continent of B/B; LBM 4/17  maintain continence  monitor for changes in continence   Swallow/Nutrition/ Hydration             ADL's   Min A/supervision   24/7 supervision   safety awareness, ADL training, pt/family ed.   Mobility   min guard overall   supervision       Communication             Safety/Cognition/ Behavioral Observations  Min A for recall and supervision for basic problem solving   Mod I   complex problem solving, anticipatory awareness, recall   Pain   occasional c/o headache; tylenol   <3  monitor for nonverbal cues for pain and treat accordingly   Skin   scattered bruising  free of infection and skin breakdown  assess for changes in skin integrity      *See Care Plan and progress notes for long and short-term goals.  Barriers to  Discharge: see above    Possible Resolutions to Barriers:  establish toileting program    Discharge Planning/Teaching Needs:  Home with wife who can provide assistance, pt so gald to be here.      Team Discussion:  Goals supervision level due to impulsivity and safety issues. At times needs cueing and re-direction-usually later in the afternoon. Question if sundowning. Need wife to come in for education. Medically  stable according to MD  Revisions to Treatment Plan:  DC 4/21   Continued Need for Acute Rehabilitation Level of Care: The patient requires daily medical management by a physician with specialized training in physical medicine and rehabilitation for the following conditions: Daily direction of a multidisciplinary physical rehabilitation program to ensure safe treatment while eliciting the highest outcome that is of practical value to the patient.: Yes Daily medical management of patient stability for increased activity during participation in an intensive rehabilitation regime.: Yes Daily analysis of laboratory values and/or radiology reports with any subsequent need for medication adjustment of medical intervention for : Neurological problems  Mikhai Bienvenue, Lemar Livings 07/04/2016, 3:20 PM

## 2016-07-05 ENCOUNTER — Encounter (HOSPITAL_COMMUNITY): Payer: Medicare Other | Admitting: Occupational Therapy

## 2016-07-05 ENCOUNTER — Inpatient Hospital Stay (HOSPITAL_COMMUNITY): Payer: Medicare Other | Admitting: Physical Therapy

## 2016-07-05 ENCOUNTER — Inpatient Hospital Stay (HOSPITAL_COMMUNITY): Payer: Medicare Other | Admitting: Speech Pathology

## 2016-07-05 LAB — GLUCOSE, CAPILLARY
Glucose-Capillary: 186 mg/dL — ABNORMAL HIGH (ref 65–99)
Glucose-Capillary: 157 mg/dL — ABNORMAL HIGH (ref 65–99)
Glucose-Capillary: 171 mg/dL — ABNORMAL HIGH (ref 65–99)
Glucose-Capillary: 165 mg/dL — ABNORMAL HIGH (ref 65–99)

## 2016-07-05 MED ORDER — METFORMIN HCL 500 MG PO TABS
500.0000 mg | ORAL_TABLET | Freq: Two times a day (BID) | ORAL | Status: DC
  Administered 2016-07-05 – 2016-07-07 (×4): 500 mg via ORAL
  Filled 2016-07-05 (×4): qty 1

## 2016-07-05 MED ORDER — INSULIN GLARGINE 100 UNIT/ML ~~LOC~~ SOLN
25.0000 [IU] | Freq: Every day | SUBCUTANEOUS | Status: DC
  Administered 2016-07-05 – 2016-07-06 (×2): 25 [IU] via SUBCUTANEOUS
  Filled 2016-07-05 (×3): qty 0.25

## 2016-07-05 NOTE — Progress Notes (Signed)
Physical Therapy Session Note  Patient Details  Name: Luis Yu MRN: [id number] Date of Birth: 10/07/1945  Today's Date: 07/05/2016 PT Individual Time: 1000-1057 PT Individual Time Calculation (min): 57 min   Short Term Goals: Week 1:  PT Short Term Goal 1 (Week 1): =LTGs due to ELOS  Skilled Therapeutic Interventions/Progress Updates:    no c/o pain.  Session focus on safety awareness, gait in simulated home environment, balance, and activity tolerance.    Pt transitions out of bed with set up assist and to w/c with supervision and verbal cues for safety.  Pt propels w/c to therapy gym with min cues for path finding.  Gait training on compliant surface x10' with steady assist and verbal cues for safety with ascent/descent from floor mat.  Gait training through cone obstacle course x2 (rest break in between) with steady assist with intermittent close supervision focus on maintaining walker postioning and posture for safety during turns.  Nustep x 10 minutes at level 4 for activity tolerance, strengthening, and cardiovascular exercise.  Pt requesting to use toilet, so returned to room total assist for urgency.  Pt toileted with intermittent supervision using RW for transfer.  Hand washing at sink, pt perseverative on getting soap and washing hands but responds to min redirection to terminate task.  Pt returned to w/c at end of session, QRB in place, call bell in reach, and needs met.   Therapy Documentation Precautions:  Precautions Precautions: Fall Precaution Comments: impulsive Restrictions Weight Bearing Restrictions: No   See Function Navigator for Current Functional Status.   Therapy/Group: Individual Therapy  Earnest Conroy Penven-Crew 07/05/2016, 10:37 AM

## 2016-07-05 NOTE — IPOC Note (Signed)
Overall Plan of Care [facility]) Patient Details Name: Luis Yu MRN: [id number] DOB: Oct 29, 1945  Admitting Diagnosis: L CVA  Hospital Problems: Active Problems:   Frontal lobe and executive function deficit following cerebral infarction     Functional Problem List: Nursing Bladder, Bowel, Edema, Endurance, Medication Management, Nutrition, Pain, Safety, Skin Integrity  PT Balance, Endurance, Safety  OT Balance, Endurance, Motor, Safety  SLP Cognition  TR         Basic ADL's: OT Grooming, Bathing, Dressing, Toileting     Advanced  ADL's: OT       Transfers: PT Bed Mobility, Bed to Chair, Car, Occupational psychologist, Research scientist (life sciences): PT Stairs, Ambulation     Additional Impairments: OT None  SLP Social Cognition   Memory  TR      Anticipated Outcomes Item Anticipated Outcome  Self Feeding    Swallowing      Basic self-care  Mod I  Toileting  Mod I   Bathroom Transfers Supervision  Bowel/Bladder  min assist  Transfers  supervision  Locomotion  supervision  Communication     Cognition  Mod I with basic and complex familiar tasks   Pain  <3 on a 0-10 pain scale  Safety/Judgment  min assist and call for assist before getting out of bed or up from chair   Therapy Plan: PT Intensity: Minimum of 1-2 x/day ,45 to 90 minutes PT Frequency: 5 out of 7 days PT Duration Estimated Length of Stay: 5-7 days OT Intensity: Minimum of 1-2 x/day, 45 to 90 minutes OT Frequency: 5 out of 7 days OT Duration/Estimated Length of Stay: 3-5 days SLP Intensity: Minumum of 1-2 x/day, 30 to 90 minutes SLP Frequency: 3 to 5 out of 7 days SLP Duration/Estimated Length of Stay: 3-5 days        Team Interventions: Nursing Interventions Patient/Family Education, Bladder Management, Bowel Management, Disease Management/Prevention, Pain Management, Medication Management, Skin Care/Wound Management, Psychosocial Support  PT interventions Ambulation/gait training,  Community reintegration, DME/adaptive equipment instruction, Neuromuscular re-education, Psychosocial support, Stair training, UE/LE Strength taining/ROM, Wheelchair propulsion/positioning, Warden/ranger, Discharge planning, Therapeutic Activities, UE/LE Coordination activities, Therapeutic Exercise, Patient/family education, Functional mobility training, Cognitive remediation/compensation  OT Interventions Community reintegration, Discharge planning, DME/adaptive equipment instruction, Functional mobility training, Neuromuscular re-education, Patient/family education, Self Care/advanced ADL retraining, Therapeutic Activities, Therapeutic Exercise, UE/LE Coordination activities, UE/LE Strength taining/ROM  SLP Interventions Cognitive remediation/compensation, Cueing hierarchy, Functional tasks, Environmental controls, Internal/external aids, Patient/family education, Therapeutic Activities  TR Interventions    SW/CM Interventions Discharge Planning, Psychosocial Support, Patient/Family Education    Team Discharge Planning: Destination: PT-Home ,OT- Home , SLP-Home Projected Follow-up: PT-Outpatient PT, 24 hour supervision/assistance, OT-  Home health OT, 24 hour supervision/assistance, SLP-24 hour supervision/assistance, None Projected Equipment Needs: PT-None recommended by PT, OT- To be determined, SLP-None recommended by SLP Equipment Details: PT-has all DME already, OT-  Patient/family involved in discharge planning: PT- Patient,  OT-Patient, SLP-Patient  MD ELOS: 5-7d Medical Rehab Prognosis:  Good Assessment:  71 y.o.right handed malewith history of bilateral TKA, CAD status post CABG, chronic atrial fibrillation maintained on aspirin and Eliquis, hypertension, diabetes mellitus. Per chart review patient lives with spouse. One level home 3 steps to entry. He was independent with a walker prior to admission. Presented 06/28/2016 after being referred to the ER by primary care  physician after a fall while at the office with persistent nausea and vomiting. In the ER CT of the head and neck was unremarkable and was  planned for  discharge to home. While waiting outside for his wife to pick him up patient with ongoing nausea and vomiting a repeat CT head reviewed,  again negative. Blood pressure 130/110. MRI of the brain showed punctate focus of acute ischemia within the posterior left frontal white matter without hemorrhage or mass effect. Chronic microvascular ischemia and old lacunar infarcts. MRA was unremarkable. Carotid Dopplers with mild to moderate heterogeneous plaque in bilateral carotid artery suggestive of less than 50% stenosis. Echocardiogram with ejection fraction of 60% no wall motion abnormalities. EEG was negative. Neurology follow-up patient remains on aspirin and Eliquisas prior to admission   Now requiring 24/7 Rehab RN,MD, as well as CIR level PT, OT and SLP.  Treatment team will focus on ADLs and mobility with goals set at Supervision See Team Conference Notes for weekly updates to the plan of care

## 2016-07-05 NOTE — Progress Notes (Signed)
Subjective/Complaints: Episode of urinary and bowel incont Poor safety awareness with chronic moderate dementia Does not remember me from yesterday ROS no CP SOB, -N/V/D  Objective: Vital Signs: Blood pressure (!) 168/88, pulse 70, temperature 97.7 F (36.5 C), temperature source Oral, resp. rate 18, height _0  (1.88 m), weight 96.8 kg (213 lb 4.8 oz), SpO2 93 %. No results found. Results for orders placed or performed during the hospital encounter of 07/02/16 (from the past 72 hour(s))  Glucose, capillary     Status: Abnormal   Collection Time: 07/02/16  4:32 PM  Result Value Ref Range   Glucose-Capillary 170 (H) 65 - 99 mg/dL  Glucose, capillary     Status: Abnormal   Collection Time: 07/02/16 10:04 PM  Result Value Ref Range   Glucose-Capillary 159 (H) 65 - 99 mg/dL  Glucose, capillary     Status: Abnormal   Collection Time: 07/03/16  6:20 AM  Result Value Ref Range   Glucose-Capillary 100 (H) 65 - 99 mg/dL  CBC WITH DIFFERENTIAL     Status: Abnormal   Collection Time: 07/03/16  6:24 AM  Result Value Ref Range   WBC 7.3 4.0 - 10.5 K/uL   RBC 4.05 (L) 4.22 - 5.81 MIL/uL   Hemoglobin 11.7 (L) 13.0 - 17.0 g/dL   HCT 36.6 (L) 39.0 - 52.0 %   MCV 90.4 78.0 - 100.0 fL   MCH 28.9 26.0 - 34.0 pg   MCHC 32.0 30.0 - 36.0 g/dL   RDW 15.5 11.5 - 15.5 %   Platelets 125 (L) 150 - 400 K/uL   Neutrophils Relative % 63 %   Neutro Abs 4.5 1.7 - 7.7 K/uL   Lymphocytes Relative 16 %   Lymphs Abs 1.2 0.7 - 4.0 K/uL   Monocytes Relative 6 %   Monocytes Absolute 0.5 0.1 - 1.0 K/uL   Eosinophils Relative 15 %   Eosinophils Absolute 1.1 (H) 0.0 - 0.7 K/uL   Basophils Relative 0 %   Basophils Absolute 0.0 0.0 - 0.1 K/uL  Comprehensive metabolic panel     Status: Abnormal   Collection Time: 07/03/16  6:24 AM  Result Value Ref Range   Sodium 138 135 - 145 mmol/L   Potassium 3.7 3.5 - 5.1 mmol/L   Chloride 103 101 - 111 mmol/L   CO2 27 22 - 32 mmol/L   Glucose, Bld 115 (H) 65 - 99  mg/dL   BUN 14 6 - 20 mg/dL   Creatinine, Ser 0.96 0.61 - 1.24 mg/dL   Calcium 8.4 (L) 8.9 - 10.3 mg/dL   Total Protein 5.5 (L) 6.5 - 8.1 g/dL   Albumin 3.1 (L) 3.5 - 5.0 g/dL   AST 29 15 - 41 U/L   ALT 33 17 - 63 U/L   Alkaline Phosphatase 88 38 - 126 U/L   Total Bilirubin 1.4 (H) 0.3 - 1.2 mg/dL   GFR calc non Af Amer >60 >60 mL/min   GFR calc Af Amer >60 >60 mL/min    Comment: (NOTE) The eGFR has been calculated using the CKD EPI equation. This calculation has not been validated in all clinical situations. eGFR's persistently <60 mL/min signify possible Chronic Kidney Disease.    Anion gap 8 5 - 15  Glucose, capillary     Status: Abnormal   Collection Time: 07/03/16 12:01 PM  Result Value Ref Range   Glucose-Capillary 193 (H) 65 - 99 mg/dL  Glucose, capillary     Status: Abnormal   Collection  Time: 07/03/16  5:09 PM  Result Value Ref Range   Glucose-Capillary 166 (H) 65 - 99 mg/dL  Glucose, capillary     Status: Abnormal   Collection Time: 07/03/16 10:31 PM  Result Value Ref Range   Glucose-Capillary 228 (H) 65 - 99 mg/dL  Glucose, capillary     Status: Abnormal   Collection Time: 07/04/16  5:57 AM  Result Value Ref Range   Glucose-Capillary 143 (H) 65 - 99 mg/dL  Glucose, capillary     Status: Abnormal   Collection Time: 07/04/16 11:29 AM  Result Value Ref Range   Glucose-Capillary 169 (H) 65 - 99 mg/dL  Glucose, capillary     Status: Abnormal   Collection Time: 07/04/16  4:55 PM  Result Value Ref Range   Glucose-Capillary 354 (H) 65 - 99 mg/dL  Glucose, capillary     Status: Abnormal   Collection Time: 07/04/16 10:21 PM  Result Value Ref Range   Glucose-Capillary 192 (H) 65 - 99 mg/dL  Glucose, capillary     Status: Abnormal   Collection Time: 07/05/16  6:02 AM  Result Value Ref Range   Glucose-Capillary 186 (H) 65 - 99 mg/dL     HEENT: normal Cardio: RRR and no murmur Resp: CTA B/L and unlabored GI: BS positive and NT, ND Extremity:  No Edema Skin:    Intact Neuro: Alert/Oriented, Abnormal Motor 4/5 in BUE adn BLE except 3- R ankle DF, Abnormal FMC Ataxic/ dec FMC and Other Sitting balance is good Musc/Skel:  Other no pain with UE or LE ROM Gen NAD   Assessment/Plan: 1. Functional deficits secondary to left frontal small vessel infarct which require 3+ hours per day of interdisciplinary therapy in a comprehensive inpatient rehab setting. Physiatrist is providing close team supervision and 24 hour management of active medical problems listed below. Physiatrist and rehab team continue to assess barriers to discharge/monitor patient progress toward functional and medical goals. FIM: Function - Bathing Position: Shower Body parts bathed by patient: Left arm, Right arm, Chest, Abdomen, Front perineal area, Buttocks, Right upper leg, Left upper leg Body parts bathed by helper: Right lower leg, Left lower leg, Back Assist Level: Touching or steadying assistance(Pt > 75%)  Function- Upper Body Dressing/Undressing What is the patient wearing?: Pull over shirt/dress Pull over shirt/dress - Perfomed by patient: Thread/unthread left sleeve, Thread/unthread right sleeve, Put head through opening Pull over shirt/dress - Perfomed by helper: Pull shirt over trunk Assist Level: Touching or steadying assistance(Pt > 75%) Function - Lower Body Dressing/Undressing What is the patient wearing?: Pants, Underwear, Socks, Shoes Position: Wheelchair/chair at [employer] - Performed by patient: Pull underwear up/down Underwear - Performed by helper: Pull underwear up/down Pants- Performed by patient: Pull pants up/down Pants- Performed by helper: Pull pants up/down Socks - Performed by patient: Don/doff right sock, Don/doff left sock Shoes - Performed by patient: Don/doff left shoe, Don/doff right shoe, Fasten right, Fasten left (Velcro) Assist for footwear: Supervision/touching assist Assist for lower body dressing: Touching or steadying assistance (Pt  > 75%)  Function - Toileting Toileting steps completed by helper: Adjust clothing prior to toileting, Performs perineal hygiene, Adjust clothing after toileting  Function - Toilet Transfers Toilet transfer assistive device: Elevated toilet seat/BSC over toilet, Grab bar Assist level to toilet: Touching or steadying assistance (Pt > 75%) Assist level from toilet: Touching or steadying assistance (Pt > 75%) Assist level to bedside commode (at bedside): Touching or steadying assistance (Pt > 75%) Assist level from bedside commode (at bedside):  Touching or steadying assistance (Pt > 75%)  Function - Chair/bed transfer Chair/bed transfer activity did not occur: N/A Chair/bed transfer method: Stand pivot Chair/bed transfer assist level: Touching or steadying assistance (Pt > 75%) Chair/bed transfer assistive device: Armrests, Walker Chair/bed transfer details: Verbal cues for safe use of DME/AE  Function - Locomotion: Wheelchair Will patient use wheelchair at discharge?: No Max wheelchair distance: 150 Assist Level: Supervision or verbal cues Assist Level: Supervision or verbal cues Assist Level: Supervision or verbal cues Turns around,maneuvers to table,bed, and toilet,negotiates 3% grade,maneuvers on rugs and over doorsills: No Function - Locomotion: Ambulation Assistive device: Walker-rolling Max distance: 180 Assist level: Touching or steadying assistance (Pt > 75%) Assist level: Touching or steadying assistance (Pt > 75%) Assist level: Touching or steadying assistance (Pt > 75%) Assist level: Touching or steadying assistance (Pt > 75%)  Function - Comprehension Comprehension: Auditory Comprehension assistive device: Hearing aids Comprehension assist level: Follows basic conversation/direction with extra time/assistive device  Function - Expression Expression: Verbal Expression assist level: Expresses basic needs/ideas: With extra time/assistive device  Function - Social  Interaction Social Interaction assist level: Interacts appropriately 90% of the time - Needs monitoring or encouragement for participation or interaction.  Function - Problem Solving Problem solving assist level: Solves basic 90% of the time/requires cueing < 10% of the time, Solves complex 90% of the time/cues < 10% of the time  Function - Memory Memory assist level: Recognizes or recalls 75 - 89% of the time/requires cueing 10 - 24% of the time Patient normally able to recall (first 3 days only): That he or she is in a hospital, Staff names and faces, Current season  Medical Problem List and Plan: 1.  Gait disorder with ataxia secondary to left frontal lobe infarct Plan d/c 4/21 with [place] f/u    2.  DVT Prophylaxis/Anticoagulation: Eliquis and ASA 61m 3. Pain Management: Tylenol as needed 4. Mood: Wellbutrin 150 mg twice a day, Celexa 20 mg daily 5. Neuropsych: This patient is capable of making decisions on his own behalf. 6. Skin/Wound Care: Routine skin checks 7. Fluids/Electrolytes/Nutrition: Routine I&O with follow-up chemistries 8. CAD with history of CABG. Continue aspirin. No chest pain or shortness of breath 9. Atrial fibrillation. Cardiac rate controlled. Continue Eliquis 10. Hypertension. Imdur 30 mg daily, Bystolic 20 mg daily 11. Diabetes mellitus with peripheral neuropathy. Hemoglobin A1c 7.6. Lantus insulin 20 units daily. Check CBGs before meals and at bedtime. Diabetic teaching. Glucophage 1000 mg twice a day, Glucotrol 10 mg twice a day prior to admission.Increase  metformin 5013mto BID, increase lantus to 25U CBG (last 3)   Recent Labs  07/04/16 1655 07/04/16 2221 07/05/16 0602  GLUCAP 354* 192* 186*       12. Hyperlipidemia. Lipitor  LOS (Days) 3 A FACE TO FACE EVALUATION WAS PERFORMED  KIRSTEINS,ANDREW E 07/05/2016, 8:21 AM

## 2016-07-05 NOTE — Progress Notes (Signed)
Physical Therapy Session Note  Patient Details  Name: Luis Yu MRN: [id number] Date of Birth: 1945-07-06  Today's Date: 07/05/2016 PT Individual Time: 1520-1600 PT Individual Time Calculation (min): 40 min   Short Term Goals: Week 1:  PT Short Term Goal 1 (Week 1): =LTGs due to ELOS  Skilled Therapeutic Interventions/Progress Updates:    no c/o pain, but does report fatigue.  Session focus on family education for transfers, ambulation, and stair negotiation.    PT provided supervision and verbal cues to pt's wife, Luis Yu, for pt to perform supervision level car transfer with RW, ambulation with RW, and stair negotiation with 2 rails.  PT educated wife on providing minimal cues only when necessary to avoid overstimulating pt and causing him to get frustrated.  Also provided suggestion to wife for finding time to take care of herself (while still ensuring pt has 24/7 supervision).  Pt responded well to cues throughout from both therapist and wife.  Returned to room at end of session and positioned pt in bed with alarm activated, call bell in reach and needs met.   Therapy Documentation Precautions:  Precautions Precautions: Fall Precaution Comments: impulsive Restrictions Weight Bearing Restrictions: No   See Function Navigator for Current Functional Status.   Therapy/Group: Individual Therapy  Earnest Conroy Penven-Crew 07/05/2016, 4:34 PM

## 2016-07-05 NOTE — Progress Notes (Signed)
Social Work Patient ID: Luis Yu, male   DOB: 18-Jul-1945, 71 y.o.   MRN: [id number]  Met with pt and wife who is here for education to prepare for discharge Sat. She voiced he is doing well as long as he  Continues this at home. At times he gets home and stops moving as much. Discussed OP therapies they would like to go to [facility] in [place], will make referral to them. Awaiting equipment needs from Therapy team.

## 2016-07-05 NOTE — Progress Notes (Signed)
Occupational Therapy Session Note  Patient Details  Name: Luis Yu MRN: [id number] Date of Birth: 1945-06-21  Today's Date: 07/05/2016 OT Individual Time: 1430-1520 OT Individual Time Calculation (min): 50 min   Short Term Goals: Week 1:  OT Short Term Goal 1 (Week 1): LTG=STG 2/2 estimated short LOS  Skilled Therapeutic Interventions/Progress Updates:    OT treatment session focused on pt/family education, shower transfers, and home safety modifications. Discussed with pt and spouse home bathroom set-up for simulated walk-in shower and toilet transfer. Demonstrated side step technique, then had pt practice transfer with OT assist w/ supervision and intermittent min guard A for slight posterior LOB. Mod VC for RW placement and technique. Pt practiced 2x with OT, then had spouse cue pt for transfer. Pt fatigues quickly and required extended rest breaks between transfers. Educated pt and spouse on home modifications for safe ADL participation. Pt handoff to PT.   Therapy Documentation Precautions:  Precautions Precautions: Fall Precaution Comments: impulsive Restrictions Weight Bearing Restrictions: No Pain: Pain Assessment Pain Assessment: No/denies pain ADL: ADL ADL Comments: Please see functional navigator  See Function Navigator for Current Functional Status.   Therapy/Group: Individual Therapy  Mal Amabile 07/05/2016, 3:38 PM

## 2016-07-05 NOTE — Progress Notes (Signed)
Speech Language Pathology Daily Session Note  Patient Details  Name: MANUEL DALL MRN: [id number] Date of Birth: Dec 12, 1945  Today's Date: 07/05/2016 SLP Individual Time: 1345-1430 SLP Individual Time Calculation (min): 45 min  Short Term Goals: Week 1: SLP Short Term Goal 1 (Week 1): STG = LTG due to length of stay   Skilled Therapeutic Interventions: Skilled treatment session focused on cognitive goals. Patient completed a check writing task and basic money management task with Mod I. Patient's wife present and felt patient's current cognitive function is close to baseline but verbalized she understood the need for patient to have 24 hour supervision to maximize safety. Patient left upright in wheelchair with wife present. Continue with current plan of care.      Function:  Cognition Comprehension Comprehension assist level: Follows basic conversation/direction with extra time/assistive device  Expression   Expression assist level: Expresses basic needs/ideas: With extra time/assistive device  Social Interaction Social Interaction assist level: Interacts appropriately 90% of the time - Needs monitoring or encouragement for participation or interaction.  Problem Solving Problem solving assist level: Solves basic 90% of the time/requires cueing < 10% of the time;Solves complex 90% of the time/cues < 10% of the time  Memory Memory assist level: Recognizes or recalls 75 - 89% of the time/requires cueing 10 - 24% of the time    Pain Pain Assessment Pain Assessment: No/denies pain  Therapy/Group: Individual Therapy  Dominque Marlin 07/05/2016, 3:47 PM

## 2016-07-05 NOTE — Progress Notes (Signed)
RT placed patient on BIPAP qhs. Patient tolerating well at this time. RT will monitor as needed.

## 2016-07-06 ENCOUNTER — Inpatient Hospital Stay (HOSPITAL_COMMUNITY): Payer: Medicare Other | Admitting: Occupational Therapy

## 2016-07-06 ENCOUNTER — Inpatient Hospital Stay (HOSPITAL_COMMUNITY): Payer: Medicare Other | Admitting: Physical Therapy

## 2016-07-06 ENCOUNTER — Inpatient Hospital Stay (HOSPITAL_COMMUNITY): Payer: Medicare Other | Admitting: Speech Pathology

## 2016-07-06 LAB — GLUCOSE, CAPILLARY
Glucose-Capillary: 111 mg/dL — ABNORMAL HIGH (ref 65–99)
Glucose-Capillary: 177 mg/dL — ABNORMAL HIGH (ref 65–99)
Glucose-Capillary: 177 mg/dL — ABNORMAL HIGH (ref 65–99)
Glucose-Capillary: 237 mg/dL — ABNORMAL HIGH (ref 65–99)

## 2016-07-06 MED ORDER — BUPROPION HCL ER (SR) 150 MG PO TB12: 150.0000 mg | ORAL_TABLET | Freq: Two times a day (BID) | ORAL | 1 refills | Status: DC

## 2016-07-06 MED ORDER — METFORMIN HCL 500 MG PO TABS: 500.0000 mg | ORAL_TABLET | Freq: Two times a day (BID) | ORAL | 1 refills | Status: DC

## 2016-07-06 MED ORDER — ISOSORBIDE MONONITRATE ER 30 MG PO TB24: 30.0000 mg | ORAL_TABLET | Freq: Every day | ORAL | 12 refills | Status: DC

## 2016-07-06 MED ORDER — ATORVASTATIN CALCIUM 80 MG PO TABS: 80.0000 mg | ORAL_TABLET | Freq: Every day | ORAL | 3 refills | Status: DC

## 2016-07-06 MED ORDER — "PEN NEEDLES 5/16"" 31G X 8 MM MISC": 11 refills | Status: DC

## 2016-07-06 MED ORDER — OMEPRAZOLE 20 MG PO CPDR: DELAYED_RELEASE_CAPSULE | ORAL | 0 refills | Status: DC

## 2016-07-06 MED ORDER — VITAMIN B-12 1000 MCG PO TABS: 2000.0000 ug | ORAL_TABLET | Freq: Every day | ORAL | 1 refills | Status: DC

## 2016-07-06 MED ORDER — APIXABAN 5 MG PO TABS: 5.0000 mg | ORAL_TABLET | Freq: Two times a day (BID) | ORAL | 11 refills | Status: DC

## 2016-07-06 MED ORDER — NEBIVOLOL HCL 20 MG PO TABS: 20.0000 mg | ORAL_TABLET | Freq: Every day | ORAL | 1 refills | Status: DC

## 2016-07-06 MED ORDER — BASAGLAR KWIKPEN 100 UNIT/ML ~~LOC~~ SOPN: 25.0000 [IU] | PEN_INJECTOR | Freq: Every day | SUBCUTANEOUS | 11 refills | Status: AC

## 2016-07-06 MED ORDER — CITALOPRAM HYDROBROMIDE 20 MG PO TABS: 20.0000 mg | ORAL_TABLET | Freq: Every day | ORAL | 1 refills | Status: DC

## 2016-07-06 MED ORDER — ACCU-CHEK MULTICLIX LANCETS MISC: 5 refills | Status: DC

## 2016-07-06 MED ORDER — THIAMINE HCL 100 MG PO TABS
100.0000 mg | ORAL_TABLET | Freq: Every day | ORAL | 1 refills | Status: DC
Start: 2016-07-06 — End: 2016-08-09

## 2016-07-06 NOTE — Progress Notes (Signed)
RT placed patient on BIPAP for the night. Patient tolerating well at this time with no respiratory distress noted. RT will monitor as needed.  

## 2016-07-06 NOTE — Progress Notes (Signed)
Speech Language Pathology Daily Session Note  Patient Details  Name: Luis Yu MRN: [id number] Date of Birth: 07/23/1945  Today's Date: 07/06/2016 SLP Individual Time: 1400-1430 SLP Individual Time Calculation (min): 30 min  Short Term Goals: Week 1: SLP Short Term Goal 1 (Week 1): STG = LTG due to length of stay   Skilled Therapeutic Interventions: Skilled treatment session focused on cognitive goals. Upon arrival, patient was supine in bed but appeared lethargic. However, he was agreeable to participate in treatment session.  Patient's wife present and both educated on memory compensatory strategies and how to incorporate them at home to maximize recall and carryover of information as well as overall safety and independence. Both verbalized understanding and agreement. Patient left sitting EOB with wife present. Continue with current plan of care.    Function:    Cognition Comprehension Comprehension assist level: Follows basic conversation/direction with extra time/assistive device  Expression   Expression assist level: Expresses basic needs/ideas: With extra time/assistive device  Social Interaction Social Interaction assist level: Interacts appropriately 90% of the time - Needs monitoring or encouragement for participation or interaction.  Problem Solving Problem solving assist level: Solves basic 90% of the time/requires cueing < 10% of the time;Solves complex 90% of the time/cues < 10% of the time  Memory Memory assist level: Recognizes or recalls 75 - 89% of the time/requires cueing 10 - 24% of the time    Pain No/Denies Pain   Therapy/Group: Individual Therapy  Bryana Froemming 07/06/2016, 3:47 PM

## 2016-07-06 NOTE — Progress Notes (Addendum)
Physical Therapy Discharge Summary  Patient Details  Name: Luis Yu MRN: [id number] Date of Birth: [birth year]  Today's Date: 07/06/2016 PT Individual Time: 1100-1200 PT Individual Time Calculation (min): 60 min    Patient has met 11 of 12 long term goals due to improved activity tolerance, improved balance, ability to compensate for deficits and improved attention.  Patient to discharge at household ambulatory, community w/c levle level with 24/7 Supervision.   Patient's care partner is independent to provide the necessary cognitive assistance at discharge.  Reasons goals not met: Pt with baseline cognitive deficits and occasionally requires increased cues for intellectual awareness.   Recommendation:  Patient will benefit from ongoing skilled PT services in outpatient setting to continue to advance safe functional mobility, address ongoing impairments in balance, and safety awareness, and minimize fall risk.  Equipment: RW  Reasons for discharge: treatment goals met  Patient/family agrees with progress made and goals achieved: Yes   Skilled PT intervention:  No c/o pain.  Session focus on completion of d/c assessment (see below for details), activity tolerance, and pt education in preparation for upcoming d/c.   Pt propels w/c throughout unit with BUEs/BLEs for mobility and activity tolerance with focus on self selected rest breaks to maximize performance.  Stair negotiation and ambulation with RW both with supervision but pt requiring min verbal cues from PT for not holding breath or hyperventilating.  Path finding back to room from ortho gym (pt has only been to 1x) with supervision and increased time.  Pt left upright in w/c with QRB in place, call bell in reach and needs met.   PT Discharge Precautions/Restrictions  fall  Pain Pain Assessment Pain Assessment: No/denies pain  Cognition Overall Cognitive Status: Impaired/Different from baseline Arousal/Alertness:  Awake/alert Orientation Level: Oriented to person;Oriented to place;Oriented to time;Disoriented to situation (off date by 1 day, unable to recall stroke for situation, states he here for balance because he fell 2/2 a "battle with a-fib") Attention: Sustained Sustained Attention: Appears intact Memory: Impaired Memory Impairment: Retrieval deficit;Decreased recall of new information Awareness: Impaired Awareness Impairment: Intellectual impairment Problem Solving: Impaired Safety/Judgment: Impaired Comments: fall risk with histroy of multiple falls; wife reports memory is not changed but pt feels it is worse Sensation Sensation Light Touch: Impaired Detail (pre-morbid neuropathy in BLEs) Coordination Gross Motor Movements are Fluid and Coordinated: No Fine Motor Movements are Fluid and Coordinated: No Motor  Motor Motor - Discharge Observations: decreased activity tolerance  Mobility Bed Mobility Bed Mobility: Sit to Supine;Supine to Sit Supine to Sit: 5: Supervision Sit to Supine: 5: Supervision Transfers Transfers: Yes Sit to Stand: 5: Supervision Stand to Sit: 5: Supervision Stand Pivot Transfers: 5: Supervision Squat Pivot Transfers: 5: Supervision Locomotion  Ambulation Ambulation: Yes Ambulation/Gait Assistance: 5: Supervision Ambulation Distance (Feet): 150 Feet Assistive device: Rolling walker Ambulation/Gait Assistance Details: Verbal cues for technique Gait Gait: Yes Gait Pattern: Impaired Gait Pattern: Trendelenburg;Step-through pattern Stairs / Additional Locomotion Stairs: Yes Stairs Assistance: 4: Min guard Stair Management Technique: Two rails Number of Stairs: 8 Wheelchair Mobility Wheelchair Mobility: Yes Wheelchair Assistance: 5: Supervision Distance: 200  Trunk/Postural Assessment  Cervical Assessment Cervical Assessment: Within Functional Limits Thoracic Assessment Thoracic Assessment: Within Functional Limits Lumbar Assessment Lumbar  Assessment: Within Functional Limits Postural Control Postural Control: Deficits on evaluation Postural Limitations: decreased  Balance Dynamic Sitting Balance Sitting balance - Comments: Supervision Static Standing Balance Static Standing - Balance Support: During functional activity Static Standing - Level of Assistance: 5: Stand by assistance Dynamic  Standing Balance Dynamic Standing - Level of Assistance: 5: Stand by assistance Extremity Assessment      RLE Assessment RLE Assessment: Within Functional Limits (except dorsiflexion, 3/5) LLE Assessment LLE Assessment: Within Functional Limits   See Function Navigator for Current Functional Status.  Cheston Coury E Penven-Crew 07/06/2016, 11:34 AM

## 2016-07-06 NOTE — Progress Notes (Signed)
Subjective/Complaints: No problems overnite, aware of day and year ROS no CP SOB, -N/V/D  Objective: Vital Signs: Blood pressure (!) 166/66, pulse (!) 56, temperature 98.1 F (36.7 C), temperature source Oral, resp. rate 18, height  (1.88 m), weight 96.8 kg (213 lb 4.8 oz), SpO2 97 %. No results found. Results for orders placed or performed during the hospital encounter of 07/02/16 (from the past 72 hour(s))  Glucose, capillary     Status: Abnormal   Collection Time: 07/03/16 12:01 PM  Result Value Ref Range   Glucose-Capillary 193 (H) 65 - 99 mg/dL  Glucose, capillary     Status: Abnormal   Collection Time: 07/03/16  5:09 PM  Result Value Ref Range   Glucose-Capillary 166 (H) 65 - 99 mg/dL  Glucose, capillary     Status: Abnormal   Collection Time: 07/03/16 10:31 PM  Result Value Ref Range   Glucose-Capillary 228 (H) 65 - 99 mg/dL  Glucose, capillary     Status: Abnormal   Collection Time: 07/04/16  5:57 AM  Result Value Ref Range   Glucose-Capillary 143 (H) 65 - 99 mg/dL  Glucose, capillary     Status: Abnormal   Collection Time: 07/04/16 11:29 AM  Result Value Ref Range   Glucose-Capillary 169 (H) 65 - 99 mg/dL  Glucose, capillary     Status: Abnormal   Collection Time: 07/04/16  4:55 PM  Result Value Ref Range   Glucose-Capillary 354 (H) 65 - 99 mg/dL  Glucose, capillary     Status: Abnormal   Collection Time: 07/04/16 10:21 PM  Result Value Ref Range   Glucose-Capillary 192 (H) 65 - 99 mg/dL  Glucose, capillary     Status: Abnormal   Collection Time: 07/05/16  6:02 AM  Result Value Ref Range   Glucose-Capillary 186 (H) 65 - 99 mg/dL  Glucose, capillary     Status: Abnormal   Collection Time: 07/05/16 12:05 PM  Result Value Ref Range   Glucose-Capillary 157 (H) 65 - 99 mg/dL  Glucose, capillary     Status: Abnormal   Collection Time: 07/05/16  4:52 PM  Result Value Ref Range   Glucose-Capillary 171 (H) 65 - 99 mg/dL  Glucose, capillary     Status:  Abnormal   Collection Time: 07/05/16  9:14 PM  Result Value Ref Range   Glucose-Capillary 165 (H) 65 - 99 mg/dL  Glucose, capillary     Status: Abnormal   Collection Time: 07/06/16  6:33 AM  Result Value Ref Range   Glucose-Capillary 111 (H) 65 - 99 mg/dL     HEENT: normal Cardio: RRR and no murmur Resp: CTA B/L and unlabored GI: BS positive and NT, ND Extremity:  No Edema Skin:   Intact Neuro: Alert/Oriented, Abnormal Motor 4/5 in BUE adn BLE except 3- R ankle DF, Abnormal FMC Ataxic/ dec FMC and Other Sitting balance is good Musc/Skel:  Other no pain with UE or LE ROM Gen NAD   Assessment/Plan: 1. Functional deficits secondary to left frontal small vessel infarct which require 3+ hours per day of interdisciplinary therapy in a comprehensive inpatient rehab setting. Physiatrist is providing close team supervision and 24 hour management of active medical problems listed below. Physiatrist and rehab team continue to assess barriers to discharge/monitor patient progress toward functional and medical goals. FIM: Function - Bathing Position: Shower Body parts bathed by patient: Left arm, Right arm, Chest, Abdomen, Front perineal area, Buttocks, Right upper leg, Left upper leg Body parts bathed by helper:  Right lower leg, Left lower leg, Back Assist Level: Touching or steadying assistance(Pt > 75%)  Function- Upper Body Dressing/Undressing What is the patient wearing?: Pull over shirt/dress Pull over shirt/dress - Perfomed by patient: Thread/unthread left sleeve, Thread/unthread right sleeve, Put head through opening Pull over shirt/dress - Perfomed by helper: Pull shirt over trunk Assist Level: Touching or steadying assistance(Pt > 75%) Function - Lower Body Dressing/Undressing What is the patient wearing?: Pants, Underwear, Socks, Shoes Position: Wheelchair/chair at [employer] - Performed by patient: Pull underwear up/down Underwear - Performed by helper: Pull underwear  up/down Pants- Performed by patient: Pull pants up/down Pants- Performed by helper: Pull pants up/down Socks - Performed by patient: Don/doff right sock, Don/doff left sock Shoes - Performed by patient: Don/doff left shoe, Don/doff right shoe, Fasten right, Fasten left (Velcro) Assist for footwear: Supervision/touching assist Assist for lower body dressing: Touching or steadying assistance (Pt > 75%)  Function - Toileting Toileting steps completed by helper: Adjust clothing prior to toileting, Performs perineal hygiene, Adjust clothing after toileting  Function - Toilet Transfers Toilet transfer assistive device: Elevated toilet seat/BSC over toilet, Grab bar Assist level to toilet: Touching or steadying assistance (Pt > 75%) Assist level from toilet: Touching or steadying assistance (Pt > 75%) Assist level to bedside commode (at bedside): Touching or steadying assistance (Pt > 75%) Assist level from bedside commode (at bedside): Touching or steadying assistance (Pt > 75%)  Function - Chair/bed transfer Chair/bed transfer activity did not occur: N/A Chair/bed transfer method: Stand pivot Chair/bed transfer assist level: Supervision or verbal cues Chair/bed transfer assistive device: Armrests, Walker Chair/bed transfer details: Verbal cues for safe use of DME/AE  Function - Locomotion: Wheelchair Will patient use wheelchair at discharge?: No Max wheelchair distance: 150 Assist Level: Supervision or verbal cues Assist Level: Supervision or verbal cues Assist Level: Supervision or verbal cues Turns around,maneuvers to table,bed, and toilet,negotiates 3% grade,maneuvers on rugs and over doorsills: No Function - Locomotion: Ambulation Assistive device: Walker-rolling Max distance: 180 Assist level: Touching or steadying assistance (Pt > 75%) Assist level: Touching or steadying assistance (Pt > 75%) Assist level: Touching or steadying assistance (Pt > 75%) Assist level: Touching or  steadying assistance (Pt > 75%) Assist level: Touching or steadying assistance (Pt > 75%)  Function - Comprehension Comprehension: Auditory Comprehension assistive device: Hearing aids Comprehension assist level: Follows basic conversation/direction with extra time/assistive device  Function - Expression Expression: Verbal Expression assist level: Expresses basic needs/ideas: With extra time/assistive device  Function - Social Interaction Social Interaction assist level: Interacts appropriately 90% of the time - Needs monitoring or encouragement for participation or interaction.  Function - Problem Solving Problem solving assist level: Solves basic 90% of the time/requires cueing < 10% of the time, Solves complex 90% of the time/cues < 10% of the time  Function - Memory Memory assist level: Recognizes or recalls 75 - 89% of the time/requires cueing 10 - 24% of the time Patient normally able to recall (first 3 days only): That he or she is in a hospital, Staff names and faces, Current season  Medical Problem List and Plan: 1.  Gait disorder with ataxia secondary to left frontal lobe infarct Plan d/c 4/21 with HH f/u    2.  DVT Prophylaxis/Anticoagulation: Eliquis and ASA  3. Pain Management: Tylenol as needed 4. Mood: Wellbutrin 150 mg twice a day, Celexa 20 mg daily 5. Neuropsych: This patient is capable of making decisions on his own behalf. 6. Skin/Wound Care: Routine skin checks  7. Fluids/Electrolytes/Nutrition: Routine I&O with follow-up chemistries 8. CAD with history of CABG. Continue aspirin. No chest pain or shortness of breath 9. Atrial fibrillation. Cardiac rate controlled. Continue Eliquis 10. Hypertension. Imdur 30 mg daily, Bystolic 20 mg daily 11. Diabetes mellitus with peripheral neuropathy. Hemoglobin A1c 7.6. Lantus insulin 20 units daily. Check CBGs before meals and at bedtime. Diabetic teaching. Glucophage 1000 mg twice a day, Glucotrol 10 mg twice a day prior  to admission.Increase  metformin  to BID, increased lantus to 25U, am CBG is good CBG (last 3)   Recent Labs  07/05/16 1652 07/05/16 2114 07/06/16 0633  GLUCAP 171* 165* 111*       12. Hyperlipidemia. Lipitor  LOS (Days) 4 A FACE TO FACE EVALUATION WAS PERFORMED  Luis Yu E 07/06/2016, 8:44 AM

## 2016-07-06 NOTE — Progress Notes (Signed)
Occupational Therapy Session Note  Patient Details  Name: Luis Yu MRN: [id number] Date of Birth: 28-Apr-1945  Today's Date: 07/06/2016 OT Individual Time: 1300-1400 OT Individual Time Calculation (min): 60 min   Short Term Goals: Week 1:  OT Short Term Goal 1 (Week 1): LTG=STG 2/2 estimated short LOS  Skilled Therapeutic Interventions/Progress Updates:    OT treatment session focused on pt/family ed, modified dressing, safety awareness, and functional transfers. Pt completed LB and UB dressing with supervision and verbal cues for safe use of RW. Educated pt's spouse on cuing pt without over bearing him with commands. Pt then ambulated with RW and supervision with 1 seated rest break. Set-up simulated walk-in shower transfer. OT reviewed technique with pt, then he was able to demonstrate understanding with min VC and overall supervision. Reviewed energy conservation techniques and home safety modification with pt and spouse. Pt left semi-reclined in bed with bed alarm on and spouse present.   Therapy Documentation Precautions:  Precautions Precautions: Fall Precaution Comments: impulsive Restrictions Weight Bearing Restrictions: No Pain: Pain Assessment Pain Assessment: No/denies pain ADL: ADL ADL Comments: Please see functional navigator  See Function Navigator for Current Functional Status.   Therapy/Group: Individual Therapy  Mal Amabile 07/06/2016, 1:18 PM

## 2016-07-06 NOTE — Discharge Instructions (Signed)
Inpatient Rehab Discharge Instructions  Luis Yu Discharge date and time: No discharge date for patient encounter.   Activities/Precautions/ Functional Status: Activity: activity as tolerated Diet: diabetic diet Wound Care: none needed Functional status:  ___ No restrictions     ___ Walk up steps independently ___ 24/7 supervision/assistance   ___ Walk up steps with assistance ___ Intermittent supervision/assistance  ___ Bathe/dress independently ___ Walk with walker     _x STROKE/TIA DISCHARGE INSTRUCTIONS SMOKING Cigarette smoking nearly doubles your risk of having a stroke & is the single most alterable risk factor  If you smoke or have smoked in the last 12 months, you are advised to quit smoking for your health.  Most of the excess cardiovascular risk related to smoking disappears within a year of stopping.  Ask you doctor about anti-smoking medications  [facility] Quit Line: 1-800-QUIT NOW  Free Smoking Cessation Classes [phone]  CHOLESTEROL Know your levels; limit fat & cholesterol in your diet  Lipid Panel     Component Value Date/Time   CHOL 96 06/28/2016 0718   TRIG 88 06/28/2016 0718   HDL 28 (L) 06/28/2016 0718   CHOLHDL 3.4 06/28/2016 0718   VLDL 18 06/28/2016 0718   LDLCALC 50 06/28/2016 0718      Many patients benefit from treatment even if their cholesterol is at goal.  Goal: Total Cholesterol (CHOL) less than 160  Goal:  Triglycerides (TRIG) less than 150  Goal:  HDL greater than 40  Goal:  LDL (LDLCALC) less than 100   BLOOD PRESSURE American Stroke Association blood pressure target is less that 120/80 mm/Hg  Your discharge blood pressure is:  BP: (!) 150/92  Monitor your blood pressure  Limit your salt and alcohol intake  Many individuals will require more than one medication for high blood pressure  DIABETES (A1c is a blood sugar average for last 3 months) Goal HGBA1c is under 7% (HBGA1c is blood sugar average for last 3 months)   Diabetes:     Lab Results  Component Value Date   HGBA1C 7.6 (H) 06/28/2016     Your HGBA1c can be lowered with medications, healthy diet, and exercise.  Check your blood sugar as directed by your physician  Call your physician if you experience unexplained or low blood sugars.  PHYSICAL ACTIVITY/REHABILITATION Goal is 30 minutes at least 4 days per week  Activity: Increase activity slowly, Therapies: Physical Therapy: Home Health Return to work:   Activity decreases your risk of heart attack and stroke and makes your heart stronger.  It helps control your weight and blood pressure; helps you relax and can improve your mood.  Participate in a regular exercise program.  Talk with your doctor about the best form of exercise for you (dancing, walking, swimming, cycling).  DIET/WEIGHT Goal is to maintain a healthy weight  Your discharge diet is: Diet heart healthy/carb modified Room service appropriate? Yes; Fluid consistency: Thin  liquids Your height is:  Height:  (188 cm) Your current weight is: Weight: 96.8 kg (213 lb 4.8 oz) Your Body Mass Index (BMI) is:  BMI (Calculated): 27.4  Following the type of diet specifically designed for you will help prevent another stroke.  Your goal weight range is:    Your goal Body Mass Index (BMI) is 19-24.  Healthy food habits can help reduce 3 risk factors for stroke:  High cholesterol, hypertension, and excess weight.  RESOURCES Stroke/Support Group:  Call [phone]   STROKE EDUCATION PROVIDED/REVIEWED AND GIVEN TO  PATIENT Stroke warning signs and symptoms How to activate emergency medical system (call 911). Medications prescribed at discharge. Need for follow-up after discharge. Personal risk factors for stroke. Pneumonia vaccine given:  Flu vaccine given:  My questions have been answered, the writing is legible, and I understand these instructions.  I will adhere to these goals & educational materials that have been provided  to me after my discharge from the hospital.   __ Bathe/dress with assistance ___ Walk Independently    ___ Shower independently ___ Walk with assistance    ___ Shower with assistance ___ No alcohol     ___ Return to work/school ________  Special Instructions:    COMMUNITY REFERRALS UPON DISCHARGE:    Outpatient: PT  Agency:[name]   [phone]   Date of Last Service:07/07/2016  Appointment Date/Time:April 24 Tuesday 9:45 AM BE THERE AT 9:30 AM  Medical Equipment/Items Ordered:ROLLING WALKER & 3 IN1  Agency/Supplier:[facility]   [phone]   GENERAL COMMUNITY RESOURCES FOR PATIENT/FAMILY: Support Groups:[facility] SUPPORT GROUP EVERY SECOND Thursday @ 3:00-4:00 PM ON THE REHAB UNIT QUESTIONS CONTACT [facility] [phone]  My questions have been answered and I understand these instructions. I will adhere to these goals and the provided educational materials after my discharge from the hospital.  Patient/Caregiver Signature _______________________________ Date __________  Clinician Signature _______________________________________ Date __________  Please bring this form and your medication list with you to all your follow-up doctor's appointments.

## 2016-07-06 NOTE — Progress Notes (Signed)
Social Work  Discharge Note  The overall goal for the admission was met for:   Discharge location: Yes-HOME WITH WIFE WHO CAN PROVIDE 24 HR SUPERVISION  Length of Stay: Yes-6 DAYS  Discharge activity level: Yes-SUPERVISION LEVEL  Home/community participation: Yes  Services provided included: MD, RD, PT, OT, SLP, RN, CM, TR, Pharmacy and SW  Financial Services: Medicare and Private Insurance: [place]  Follow-up services arranged: Outpatient: [facility] 4/24 9:30-10:30 AM, DME: [facility] & 3 IN 1 and Patient/Family has no preference for HH/DME agencies  Comments (or additional information):WIFE WAS HERE TO GO THROUGH THERAPIES WITH PT AND FEELS COMFORTABLE WITH HIS CARE. WANT TO PURSUE OPPT AT [name] WAS TO GO THERE BEFORE HE FRACTURED HIS TOE. BOTH AGREEABLE TO THE PLAN AND READY FOR DC HOME.  Patient/Family verbalized understanding of follow-up arrangements: Yes  Individual responsible for coordination of the follow-up plan: [name]-WIFE  Confirmed correct DME delivered: Elease Hashimoto 07/06/2016    Elease Hashimoto

## 2016-07-06 NOTE — Plan of Care (Signed)
Problem: RH Awareness Goal: LTG: Patient will demonstrate intellectual/emergent (PT) LTG: Patient will demonstrate intellectual/emergent/anticipatory awareness with assist during a mobility activity  (PT)  Outcome: Not Met (add Reason) Continues to require increased assist for even intellectual awareness.  Occasionally cites his reason for being in the hospital is a fall due to his afib.

## 2016-07-06 NOTE — Progress Notes (Signed)
Occupational Therapy Discharge Summary  Patient Details  Name: Luis Yu MRN: [id number] Date of Birth: 30-Oct-1945  Patient has met 9 of 9 long term goals due to improved activity tolerance, improved balance, postural control and ability to compensate for deficits.  Patient to discharge at overall Supervision level.  Patient's care partner is independent to provide the necessary  cognitive assistance at discharge.    Reasons goals not met: n/a  Recommendation:  No further OT services needed at this time.  Equipment: RW and 3-in-1 BSC  Reasons for discharge: treatment goals met and discharge from hospital  Patient/family agrees with progress made and goals achieved: Yes  OT Discharge Precautions/Restrictions  Precautions Precautions: Fall Restrictions Weight Bearing Restrictions: No Pain  denies pain ADL ADL ADL Comments: Please see functional navigator Cognition Arousal/Alertness: Awake/alert Attention: Sustained Sustained Attention: Appears intact Memory: Impaired Memory Impairment: Retrieval deficit;Decreased recall of new information Awareness: Impaired Sensation Coordination Gross Motor Movements are Fluid and Coordinated: No Fine Motor Movements are Fluid and Coordinated: No Motor  Motor Motor - Discharge Observations: decreased activity tolerance Mobility  Bed Mobility Bed Mobility: Sit to Supine;Supine to Sit Supine to Sit: 5: Supervision Sit to Supine: 5: Supervision Transfers Sit to Stand: 5: Supervision Stand to Sit: 5: Supervision  Trunk/Postural Assessment  Cervical Assessment Cervical Assessment: Within Functional Limits Thoracic Assessment Thoracic Assessment: Within Functional Limits Lumbar Assessment Lumbar Assessment: Within Functional Limits  Balance Balance Balance Assessed: Yes Static Standing Balance Static Standing - Balance Support: During functional activity Static Standing - Level of Assistance: 5: Stand by  assistance Dynamic Standing Balance Dynamic Standing - Balance Support: During functional activity;Bilateral upper extremity supported Dynamic Standing - Level of Assistance: 5: Stand by assistance Dynamic Standing - Balance Activities: Reaching for objects Extremity/Trunk Assessment RUE Assessment RUE Assessment: Within Functional Limits LUE Assessment LUE Assessment: Within Functional Limits   See Function Navigator for Current Functional Status.  Daneen Schick Asher Babilonia 07/06/2016, 4:57 PM

## 2016-07-06 NOTE — Discharge Summary (Signed)
Discharge summary job [phone]

## 2016-07-06 NOTE — Progress Notes (Signed)
Physical Therapy Session Note  Patient Details  Name: Luis Yu MRN: [id number] Date of Birth: Jun 23, 1945  Today's Date: 07/06/2016 PT Individual Time: [phone] PT Individual Time Calculation (min): 46 min   Short Term Goals: Week 1:  PT Short Term Goal 1 (Week 1): =LTGs due to ELOS  Skilled Therapeutic Interventions/Progress Updates:    Pt in bed upon arrival, agreeable to PT session. Bed mobility performed at set-up level repeating X2 during session. Transfers: sit>stand at supervision level with good hand placement using rw, stand>sitting pt needing repeated cues for full turn and reaching back for chair. Pt able to perform with cues by end of session. Ambulation performed X 160 ft X 1, 120 ft X1 using rw and supervision. Distance limited by fatigue and seated rest request. Up/down 4 steps with bilateral rails and supervision. Pt returned to room after session and requests return to bed. Pt with all needs in reach.   Therapy Documentation Precautions:  Precautions Precautions: Fall Precaution Comments: impulsive Restrictions Weight Bearing Restrictions: No  Pain: Pain Assessment Pain Assessment: No/denies pain  See Function Navigator for Current Functional Status.   Therapy/Group: Individual Therapy  Delton See, PT 07/06/2016, 12:24 PM

## 2016-07-07 LAB — GLUCOSE, CAPILLARY
Glucose-Capillary: 166 mg/dL — ABNORMAL HIGH (ref 65–99)
Glucose-Capillary: 191 mg/dL — ABNORMAL HIGH (ref 65–99)

## 2016-07-07 NOTE — Progress Notes (Signed)
Pt d/c to home with personal belongings including walker and BSC.  Pt and wife verbalized understanding of d/c instructions provided by Deatra Ina PA-C.

## 2016-07-07 NOTE — Progress Notes (Signed)
Speech Language Pathology Discharge Summary  Patient Details  Name: Luis Yu MRN: [id number] Date of Birth: [birth year]  Patient has met 3 of 3 long term goals.  Patient to discharge at overall Supervision;Min level.   Reasons goals not met: N/A   Clinical Impression/Discharge Summary: Patient has made functional gains and has met 3 of 3 LTG's this admission. Currently, patient requires overall supervision-Min A cues to complete functional and familiar tasks safely in regards to awareness, problem solving and recall. Patient and family education is complete and patient will discharge home with 24 hour supervision. Both the patient and his wife report the patient is at his baseline level of cognitive functioning. Therefore, skilled SLP f/u is not warranted at this time.   Care Partner:  Caregiver Able to Provide Assistance: Yes  Type of Caregiver Assistance: Physical;Cognitive  Recommendation:  24 hour supervision/assistance;None      Equipment: N/A   Reasons for discharge: Discharged from hospital;Treatment goals met   Patient/Family Agrees with Progress Made and Goals Achieved: Yes     Luis Yu, [name] 07/07/2016, 6:52 AM

## 2016-07-07 NOTE — Progress Notes (Signed)
RN entered room to find patient in bed without BIPAP on. RN asked patient why he did not have it on and patient stated that he told respiratory that he did not want BIPAP for the night. Note by respiratory therapist states that BIPAP was placed. Patient may have removed BIPAP on his own.  Harlow Asa, RN 7:22 AM 07/07/2016

## 2016-07-07 NOTE — Progress Notes (Signed)
Luis Yu is a 71 y.o. male 10/16/1945 [id number]  Subjective: No new complaints. Slept well. Feeling OK. Glad to be going home today - no questions unanswered  Objective: Vital signs in last 24 hours: Temp:  [98 F (36.7 C)] 98 F (36.7 C) (04/21 0432) Pulse Rate:  [60-68] 68 (04/21 0432) Resp:  [18-20] 20 (04/21 0432) BP: (145-166)/(78-80) 145/80 (04/21 0432) SpO2:  [97 %-98 %] 98 % (04/21 0432) Weight change:  Last BM Date: 07/05/16  Intake/Output from previous day: 04/20 0701 - 04/21 0700 In: 705 [P.O.:705] Out: 850 [Urine:850]  Physical Exam General: No apparent distress   Lying supine; conversational and pleasant Lungs: Normal effort. Lungs clear to auscultation, no crackles or wheezes. Cardiovascular: Regular rate and rhythm, no edema Neurological: No new neurological deficits   Lab Results: BMET    Component Value Date/Time   NA 138 07/03/2016 0624   K 3.7 07/03/2016 0624   CL 103 07/03/2016 0624   CO2 27 07/03/2016 0624   GLUCOSE 115 (H) 07/03/2016 0624   BUN 14 07/03/2016 0624   CREATININE 0.96 07/03/2016 0624   CREATININE 1.21 (H) 10/31/2015 1042   CALCIUM 8.4 (L) 07/03/2016 0624   GFRNONAA >60 07/03/2016 0624   GFRAA >60 07/03/2016 0624   CBC    Component Value Date/Time   WBC 7.3 07/03/2016 0624   RBC 4.05 (L) 07/03/2016 0624   HGB 11.7 (L) 07/03/2016 0624   HCT 36.6 (L) 07/03/2016 0624   PLT 125 (L) 07/03/2016 0624   MCV 90.4 07/03/2016 0624   MCH 28.9 07/03/2016 0624   MCHC 32.0 07/03/2016 0624   RDW 15.5 07/03/2016 0624   LYMPHSABS 1.2 07/03/2016 0624   MONOABS 0.5 07/03/2016 0624   EOSABS 1.1 (H) 07/03/2016 0624   BASOSABS 0.0 07/03/2016 0624   CBG's (last 3):   Recent Labs  07/06/16 1659 07/06/16 2210 07/07/16 0658  GLUCAP 177* 237* 166*   LFT's Lab Results  Component Value Date   ALT 33 07/03/2016   AST 29 07/03/2016   ALKPHOS 88 07/03/2016   BILITOT 1.4 (H) 07/03/2016    Studies/Results: No results  found.  Medications:  I have reviewed the patient's current medications. Scheduled Medications: . apixaban  5 mg Oral BID  . aspirin EC  81 mg Oral Daily  . atorvastatin  80 mg Oral Daily  . buPROPion  150 mg Oral BID  . citalopram  20 mg Oral Daily  . GLUCERNA  1 Can Oral BID BM  . insulin aspart  0-9 Units Subcutaneous TID WC  . insulin glargine  25 Units Subcutaneous QHS  . isosorbide mononitrate  30 mg Oral Daily  . metFORMIN  500 mg Oral BID WC  . nebivolol  20 mg Oral Daily  . pantoprazole  40 mg Oral Daily  . thiamine  100 mg Oral Daily   PRN Medications: acetaminophen **OR** acetaminophen (TYLENOL) oral liquid 160 mg/5 mL **OR** acetaminophen, ondansetron **OR** ondansetron (ZOFRAN) IV, sorbitol  Assessment/Plan: Active Problems:   Frontal lobe and executive function deficit following cerebral infarction  See DC summary dated 4/20 - no change in problems or plans - Stable for DC home today as planned - see orders  Length of stay, days: 5  Valerie A. Felicity Coyer, MD 07/07/2016, 10:13 AM

## 2016-07-09 ENCOUNTER — Telehealth: Payer: Self-pay

## 2016-07-09 NOTE — Discharge Summary (Signed)
NAME:  Luis Yu, Luis Yu                       ACCOUNT NO.:  MEDICAL RECORD NO.:  [id number]  LOCATION:                                 FACILITY:  PHYSICIAN:  Erick Colace, M.D.DATE OF BIRTH:  1946/01/20  DATE OF ADMISSION:  07/02/2016 DATE OF DISCHARGE:  07/07/2016                              DISCHARGE SUMMARY   DISCHARGE DIAGNOSES: 1. Left frontal lobe infarction. 2. Deep vein thrombosis prophylaxis. 3. Pain management. 4. Depression. 5. Coronary artery disease with coronary artery bypass graft. 6. Atrial fibrillation. 7. Hypertension. 8. Diabetes mellitus with peripheral neuropathy. 9. Hyperlipidemia.  HISTORY OF PRESENT ILLNESS:  This is a 71 year old right-handed male, history of bilateral total knee replacement, CAD with CABG, chronic atrial fibrillation, maintained on Eliquis.  Lives with spouse, independent with a walker prior to admission.  Presented on June 28, 2016, after being referred to the emergency room by primary care physician after a fall while at the office with persistent nausea and vomiting.  CT of the head and neck unremarkable, plan was for discharge to home and while waiting outside for his wife to pick him up with ongoing nausea and vomiting.  A repeat CT scan again negative, blood pressure 130/110.  MRI of the brain showed a punctate focus of acute ischemia within the posterior left frontal white matter without hemorrhage or mass effect.  Chronic microvascular ischemia and old lacunar infarctions.  MRA unremarkable.  Carotid Dopplers with mild-to- moderate heterogeneous plaque.  Echocardiogram with ejection fraction of 60%.  No wall motion abnormalities.  EEG negative.  The patient was admitted for a comprehensive rehab program.  PAST MEDICAL HISTORY:  See discharge diagnoses.  SOCIAL HISTORY:  Lives with spouse, independent with a walker prior to admission.  FUNCTIONAL STATUS:  Upon admission to Rehab Services was minimal assist, 120 feet  rolling walker; minimal guard, sit to stand; mod-to-max assist, activities of daily living.  PHYSICAL EXAMINATION:  VITAL SIGNS:  Blood pressure 176/85, pulse 73, temperature 98, respirations 20. GENERAL:  This was an alert male, in no acute distress. HEENT:  EOMs intact. NECK:  Supple.  Nontender.  No JVD. CARDIAC:  Irregularly irregular. ABDOMEN:  Soft, nontender.  Good bowel sounds. LUNGS:  Clear to auscultation without wheeze.  No respiratory distress. NEUROLOGIC:  He followed full commands.  He was ataxic.  REHABILITATION HOSPITAL COURSE:  The patient was admitted to [facility]patient [facility] with therapies initiated on a 3-hour daily basis, consisting of physical therapy, occupational therapy and rehabilitation nursing as well as speech therapy.  The following issues were addressed during the patient's rehabilitation stay.  Pertaining to Mr. Domine left frontal lobe infarction remained stable, he would continue on aspirin and Eliquis as per Neurology Services.  Pain management with use of Tylenol only.  He did have a history of documented depression.  He continued on Wellbutrin as well as Celexa.  He was participating fully with the therapies.  No chest pain or shortness of breath with history of CABG.  He continued on aspirin therapy.  Blood pressure was controlled on Imdur as well as Bystolic.  Blood sugar was monitored. Hemoglobin A1c 7.6, he  continued on Lantus insulin as well as Glucophage, full diabetic teaching.  Lipitor for hyperlipidemia.  The patient received weekly collaborative interdisciplinary team conferences to discuss estimated length of stay, family teaching, any barriers to discharge.  He was ambulating with a rolling walker, stair negotiation supervision, supervision for car transfers, ongoing education for the patient's safety.  He could gather belongings for activities of daily living and homemaking.  Demonstrated side step technique into the shower,  monitoring for any loss of balance.  He was able to communicate fully his needs, completed a check, writing task and basic money management with modified independent.  Full family teaching was completed and plan discharge to home.  DISCHARGE MEDICATIONS: 1. Eliquis 5 mg p.o. b.i.d. 2. Aspirin 81 mg p.o. daily. 3. Lipitor 80 mg p.o. daily. 4. Wellbutrin 150 mg p.o. b.i.d. 5. Celexa 20 mg p.o. daily. 6. Lantus insulin 25 units at bedtime. 7. Imdur 30 mg p.o. daily. 8. Glucophage 500 mg p.o. b.i.d. 9. Bystolic 20 mg p.o. daily. 10.Protonix 40 mg p.o. daily. 11.Tylenol as needed.  DIET:  Diabetic diet.  FOLLOWUP:  [facility] would [facility]; Dr. Roda Shutters, Neurology Services, call for appointment in 1 month; Dr. Shirline Frees, medical management.  SPECIAL INSTRUCTIONS:  No driving or alcohol.     Mariam Dollar, P.A.   ______________________________ Erick Colace, M.D.    DA/MEDQ  D:  07/06/2016  T:  07/06/2016  Job:  [id number]  cc:   Marvel Plan, M.D. Shirline Frees, AGNP-C

## 2016-07-09 NOTE — Telephone Encounter (Signed)
Transitional Care call-Mrs. Nagy (wife)   1. Are you/is patient experiencing any problems since coming home? No Are there any questions regarding any aspect of care? No 2. Are there any questions regarding medications administration/dosing? No Are meds being taken as prescribed? Yes  3. Have there been any falls? No, wife is concern about "fall risk" 4. Has Home Health been to the house and/or have they contacted you? Yes   5. Are bowels and bladder emptying properly? Yes 6. Any fevers, problems with breathing, unexpected pain? No 7. Are there any skin problems or new areas of breakdown? No 8. Has the patient/family member arranged specialty MD follow up (ie cardiology/neurology/renal/surgical/etc)?  Yes  9. Does the patient need any other services or support that we can help arrange? No 10. Are caregivers following through as expected in assisting the patient? Yes 11. Has the patient quit smoking, drinking alcohol, or using drugs as recommended? Patient doesn't use alcohol, tobacco products or illicit drugs.  Appointment date and time has been confirmed. Office address has been confirmed.  Paperwork has been mailed.

## 2016-07-10 ENCOUNTER — Ambulatory Visit: Payer: Medicare Other | Attending: Physical Medicine & Rehabilitation | Admitting: Physical Therapy

## 2016-07-10 DIAGNOSIS — F339 Major depressive disorder, recurrent, unspecified: Secondary | ICD-10-CM | POA: Insufficient documentation

## 2016-07-10 DIAGNOSIS — R2689 Other abnormalities of gait and mobility: Secondary | ICD-10-CM | POA: Diagnosis not present

## 2016-07-10 DIAGNOSIS — I69314 Frontal lobe and executive function deficit following cerebral infarction: Secondary | ICD-10-CM | POA: Diagnosis not present

## 2016-07-10 DIAGNOSIS — Z760 Encounter for issue of repeat prescription: Secondary | ICD-10-CM | POA: Insufficient documentation

## 2016-07-10 DIAGNOSIS — R2681 Unsteadiness on feet: Secondary | ICD-10-CM

## 2016-07-10 DIAGNOSIS — R260 Ataxic gait: Secondary | ICD-10-CM | POA: Diagnosis not present

## 2016-07-10 DIAGNOSIS — M6281 Muscle weakness (generalized): Secondary | ICD-10-CM | POA: Diagnosis not present

## 2016-07-10 NOTE — Therapy (Signed)
[name] [address] [place], [place], [address] Phone: [phone]   Fax:  [phone]  Physical Therapy Evaluation  Patient Details  Name: Luis Yu MRN: [id number] Date of Birth: 09-Sep-1945 Referring Provider: Claudette Laws MD  Encounter Date: 07/10/2016      PT End of Session - 07/10/16 1123    Visit Number 1   Number of Visits 16   Date for PT Re-Evaluation 09/08/16   PT Start Time 0950   PT Stop Time 1032   PT Time Calculation (min) 42 min   Equipment Utilized During Treatment Gait belt   Activity Tolerance Patient tolerated treatment well   Behavior During Therapy [facility] for tasks assessed/performed      Past Medical History:  Diagnosis Date  . Adjustment disorder with mixed emotional features   . Atrial flutter (HCC)   . CAD (coronary artery disease)   . Depression   . Diabetes mellitus type II, uncontrolled (HCC)   . Diabetic peripheral neuropathy (HCC)   . Erectile dysfunction   . GERD (gastroesophageal reflux disease)   . Herpes zoster   . Hyperlipidemia   . Hypertension   . Obesity   . Osteoarthritis   . Postherpetic neuralgia   . Sleep apnea     Past Surgical History:  Procedure Laterality Date  . CARDIOVERSION N/A 11/10/2015   Procedure: CARDIOVERSION;  Surgeon: Laurey Morale, MD;  Location: [facility] ENDOSCOPY;  Service: Cardiovascular;  Laterality: N/A;  . CARDIOVERSION N/A 02/21/2016   Procedure: CARDIOVERSION;  Surgeon: Lewayne Bunting, MD;  Location: [facility] ENDOSCOPY;  Service: Cardiovascular;  Laterality: N/A;  . CORONARY ARTERY BYPASS GRAFT  1996   eith a LIMA to the LAD, saphenous vein graft to the acute marginal, saphenous vein graft to the PDA and saphenous vein graft to the circumflex.  [name] KNEE ARTHROSCOPY  05/2004   right knee  . REPLACEMENT TOTAL KNEE BILATERAL    . SHOULDER SURGERY      There were no vitals filed for this visit.       Subjective Assessment - 07/10/16 1108     Subjective The patient presents to OPPT having fallen backward on 06/27/16 when he tried to go up and curb.  He had an extended hospital stay with a discharge on 07/07/16.  Am MRI of the brain revealed left frontal lobe ischemia.  He has a Rollator at home but it was required that he use a FWW when he received inpatient physical therapy.  He reports 4 falls over the last 6 months including one time which resulted in a toe fracture.     Pertinent History Recent falls.  Peripheral neuropathy. Chronic atrial fib.; bil TKA's.   How long can you walk comfortably? Short distances with a FWW.   Diagnostic tests MRI; CT   Currently in Pain? No/denies            [facility] PT Assessment - 07/10/16 0001      Assessment   Medical Diagnosis Left CVA.   Referring Provider Claudette Laws MD   Onset Date/Surgical Date --  07/07/16     Precautions   Precautions Fall     Restrictions   Weight Bearing Restrictions No     Balance Screen   Has the patient fallen in the past 6 months Yes   How many times? --  4.   Has the patient had a decrease in activity level because of a fear of falling?  Yes   Is  the patient reluctant to leave their home because of a fear of falling?  Yes     Home Environment   Living Environment Private residence     Prior Function   Level of Independence Requires assistive device for independence     Cognition   Overall Cognitive Status Within Functional Limits for tasks assessed     Coordination   Gross Motor Movements are Fluid and Coordinated No     Posture/Postural Control   Postural Limitations Rounded Shoulders;Forward head;Decreased lumbar lordosis;Flexed trunk     ROM / Strength   AROM / PROM / Strength AROM;Strength     AROM   Overall AROM Comments The patient's bilateral LE AROM is WFL but his movements are ataxic in nature.     Strength   Overall Strength Comments Left hip flexion and abduction= 3+/5; left knee= 4/5; left ankle= 4/5.  Right hip flexion  adn abduction= 4/5; right knee 4/5 and right ankle 4/5.     Special Tests    Special Tests --  Positive Romberg test.     Ambulation/Gait   Ambulation/Gait Assistance 5: Supervision  with gait belt.   Assistive device Rolling walker   Gait Pattern Decreased step length - right;Decreased step length - left;Decreased stride length;Decreased hip/knee flexion - right;Decreased hip/knee flexion - left;Decreased dorsiflexion - right;Decreased dorsiflexion - left;Shuffle;Ataxic;Trunk flexed;Poor foot clearance - left;Poor foot clearance - right   Ambulation Surface Level     Standardized Balance Assessment   Standardized Balance Assessment Berg Balance Test     Berg Balance Test   Sit to Stand Able to stand  independently using hands   Standing Unsupported Needs several tries to stand 30 seconds unsupported   Sitting with Back Unsupported but Feet Supported on Floor or Stool Able to sit safely and securely 2 minutes   Stand to Sit Sits safely with minimal use of hands   Transfers Able to transfer safely, definite need of hands   Standing Unsupported with Eyes Closed Needs help to keep from falling   Standing Ubsupported with Feet Together Needs help to attain position and unable to hold for 15 seconds   From Standing, Reach Forward with Outstretched Arm Reaches forward but needs supervision   From Standing Position, Pick up Object from Floor Unable to pick up and needs supervision   From Standing Position, Turn to Look Behind Over each Shoulder Turn sideways only but maintains balance   Turn 360 Degrees Able to turn 360 degrees safely but slowly   Standing Unsupported, Alternately Place Feet on Step/Stool Able to complete >2 steps/needs minimal assist   Standing Unsupported, One Foot in [employer] balance while stepping or standing   Standing on One Leg Unable to try or needs assist to prevent fall   Total Score 22   Berg comment: --  High fall risk.                   [facility]  Adult PT Treatment/Exercise - 07/10/16 0001      Exercises   Exercises Knee/Hip     Knee/Hip Exercises: Aerobic   Nustep Level 3 x 8 minutes. with post BP= 126/69; 02 sat= 98% and HR= 63 bpm.                  PT Short Term Goals - 07/10/16 1219      PT SHORT TERM GOAL #1   Title Independent with an initial HEP.   Time 4   Period  Weeks   Status New     PT SHORT TERM GOAL #2   Title Increase Berg score to 32/56.   Time 4   Period Weeks   Status New           PT Long Term Goals - Jul 17, 2016 Jul 24, 1217      PT LONG TERM GOAL #1   Title Independent with an advanced HEP.   Time 8   Period Weeks   Status New     PT LONG TERM GOAL #2   Title Increase bilateral LE strength to a solid 4+/5 to increase stability for functional activites.   Time 8   Period Weeks   Status New     PT LONG TERM GOAL #3   Title Walk in clinic with FWW 500 feet with supervision only without resting and 02 not going below 93%.   Time 8   Period Weeks   Status New     PT LONG TERM GOAL #4   Title Perform a reciprocating stair gait with one railing.   Time 8   Period Weeks   Status New     PT LONG TERM GOAL #5   Title Increase Berg score to 44-45/56.   Time 8   Period Weeks   Status New               Plan - 2016-07-17 1210    Clinical Impression Statement The patient presents to OPPT with worsening balance problems.  He reports 4 falls over the last 6 months.  He had a bad fall recently and had an extended hospital stay.  He presents to the clinic today with a FWW.  He scored 22/56 on the Berg test and has a positive Romberg test.  He is globally weak throughout his bilateral LE's and his movements are axtaxic in nature.  His impairments make him at high risk for falls and greatly affects his functional mobility.  The patient will benefit from skilled physical therapy intervention.   Clinical Impairments Affecting Rehab Potential Poor balance.  H/o CVA.   PT Frequency 2x / week    PT Duration 8 weeks   PT Treatment/Interventions ADLs/Self Care Home Management;Functional mobility training;Stair training;Gait training;Therapeutic activities;Therapeutic exercise;Balance training;Neuromuscular re-education;Patient/family education   PT Next Visit Plan Please monitor BP; HR and 02 sat.  Bilateral LE strengthening to include core exercises.  Gait and balance activites.   Consulted and Agree with Plan of Care Patient;Family member/caregiver  Wife present.      Patient will benefit from skilled therapeutic intervention in order to improve the following deficits and impairments:  Abnormal gait, Decreased activity tolerance, Decreased balance, Decreased mobility, Decreased coordination, Decreased strength, Difficulty walking  Visit Diagnosis: Unsteadiness on feet - Plan: PT plan of care cert/re-cert  Muscle weakness (generalized) - Plan: PT plan of care cert/re-cert      G-Codes - 07-17-16 07/25/06    Functional Assessment Tool Used (Outpatient Only) Clinical judgement.   Functional Limitation Mobility: Walking and moving around   Mobility: Walking and Moving Around Current Status [phone]) At least 60 percent but less than 80 percent impaired, limited or restricted   Mobility: Walking and Moving Around Goal Status [phone]) At least 20 percent but less than 40 percent impaired, limited or restricted       Problem List Patient Active Problem List   Diagnosis Date Noted  . Frontal lobe and executive function deficit following cerebral infarction 07/02/2016  . Neurologic gait disorder   .  Coronary artery disease involving coronary bypass graft of native heart without angina pectoris   . Benign essential HTN   . Acute kidney injury (HCC)   . Nausea & vomiting 06/28/2016  . Ischemic stroke of frontal lobe (HCC) 06/28/2016  . Thrombocytopenia (HCC) 06/28/2016  . Renal failure (ARF), acute on chronic (HCC) 06/28/2016  . Stroke (cerebrum) (HCC) 06/28/2016  . Acute CVA  (cerebrovascular accident) (HCC) 06/28/2016  . Insomnia 02/02/2016  . Atrial fibrillation (HCC) 10/27/2015  . Uncoordinated movements 02/24/2014  . Unspecified hereditary and idiopathic peripheral neuropathy 02/24/2013  . Low back pain 02/24/2013  . Abnormality of gait 02/09/2013  . Memory loss, short term 12/05/2011  . LUMBAR STRAIN, ACUTE 05/05/2010  . OSA (obstructive sleep apnea) 09/14/2009  . BPH (benign prostatic hyperplasia) 07/27/2009  . CARDIOVASCULAR STUDIES, ABNORMAL 07/27/2009  . CHEST PAIN 06/08/2009  . OSTEOARTHRITIS 05/30/2009  . HEEL PAIN, LEFT 05/24/2009  . Dyspnea on exertion 05/24/2009  . ADJUSTMENT DISORDER WITH MIXED FEATURES 08/25/2008  . Diabetes type 2, uncontrolled (HCC) 02/24/2008  . ERECTILE DYSFUNCTION 02/10/2007  . Hyperlipidemia 10/09/2006  . OBESITY 10/09/2006  . Essential hypertension 10/09/2006  . Coronary atherosclerosis 10/09/2006  . GERD 10/09/2006  . DM (diabetes mellitus) type II uncontrolled with retinopathy 06/10/2006    Aemon Koeller, [place] MPT 07/10/2016, 12:23 PM  [facility] [address] [place], [place], [address] Phone: [phone]   Fax:  [phone]  Name: WATT GEILER MRN: [id number] Date of Birth: April 12, 1945

## 2016-07-13 ENCOUNTER — Ambulatory Visit: Payer: Medicare Other | Admitting: Physical Therapy

## 2016-07-13 ENCOUNTER — Encounter: Payer: Self-pay | Admitting: Physical Therapy

## 2016-07-13 VITALS — BP 118/64 | HR 72

## 2016-07-13 DIAGNOSIS — Z760 Encounter for issue of repeat prescription: Secondary | ICD-10-CM

## 2016-07-13 DIAGNOSIS — I69314 Frontal lobe and executive function deficit following cerebral infarction: Secondary | ICD-10-CM | POA: Diagnosis not present

## 2016-07-13 DIAGNOSIS — R2681 Unsteadiness on feet: Secondary | ICD-10-CM | POA: Diagnosis not present

## 2016-07-13 DIAGNOSIS — R260 Ataxic gait: Secondary | ICD-10-CM

## 2016-07-13 DIAGNOSIS — F339 Major depressive disorder, recurrent, unspecified: Secondary | ICD-10-CM

## 2016-07-13 DIAGNOSIS — M6281 Muscle weakness (generalized): Secondary | ICD-10-CM

## 2016-07-13 DIAGNOSIS — R2689 Other abnormalities of gait and mobility: Secondary | ICD-10-CM

## 2016-07-13 NOTE — Therapy (Signed)
[name] [address] [place], [place], [address] Phone: [phone]   Fax:  [phone]  Physical Therapy Treatment  Patient Details  Name: Luis Yu MRN: [id number] Date of Birth: January 25, 1946 Referring Provider: Claudette Laws, MD  Encounter Date: 07/13/2016      PT End of Session - 07/13/16 1053    Visit Number 2   Number of Visits 16   Date for PT Re-Evaluation 09/08/16   PT Start Time 1030   PT Stop Time 1115   PT Time Calculation (min) 45 min   Activity Tolerance Patient tolerated treatment well   Behavior During Therapy [facility] for tasks assessed/performed      Past Medical History:  Diagnosis Date  . Adjustment disorder with mixed emotional features   . Atrial flutter (HCC)   . CAD (coronary artery disease)   . Depression   . Diabetes mellitus type II, uncontrolled (HCC)   . Diabetic peripheral neuropathy (HCC)   . Erectile dysfunction   . GERD (gastroesophageal reflux disease)   . Herpes zoster   . Hyperlipidemia   . Hypertension   . Obesity   . Osteoarthritis   . Postherpetic neuralgia   . Sleep apnea     Past Surgical History:  Procedure Laterality Date  . CARDIOVERSION N/A 11/10/2015   Procedure: CARDIOVERSION;  Surgeon: Laurey Morale, MD;  Location: [facility] ENDOSCOPY;  Service: Cardiovascular;  Laterality: N/A;  . CARDIOVERSION N/A 02/21/2016   Procedure: CARDIOVERSION;  Surgeon: Lewayne Bunting, MD;  Location: [facility] ENDOSCOPY;  Service: Cardiovascular;  Laterality: N/A;  . CORONARY ARTERY BYPASS GRAFT  1996   eith a LIMA to the LAD, saphenous vein graft to the acute marginal, saphenous vein graft to the PDA and saphenous vein graft to the circumflex.  [name] KNEE ARTHROSCOPY  05/2004   right knee  . REPLACEMENT TOTAL KNEE BILATERAL    . SHOULDER SURGERY      Vitals:   07/13/16 1045  BP: 118/64  Pulse: 72  SpO2: 96%        Subjective Assessment - 07/13/16 1048    Subjective Pt arriving to therapy  complaining of no pain. Pt reporting no falls since his last visit. Pt reporting weakness.    Pertinent History Recent falls.  Peripheral neuropathy. Chronic atrial fib.; bil TKA's.   How long can you walk comfortably? Short distances with a FWW.   Diagnostic tests MRI; CT   Currently in Pain? No/denies            [facility] PT Assessment - 07/13/16 0001      Assessment   Medical Diagnosis Left CVA.   Referring Provider Claudette Laws, MD     Precautions   Precautions Fall     Restrictions   Weight Bearing Restrictions No     Balance Screen   Has the patient fallen in the past 6 months Yes     Ambulation/Gait   Ambulation Distance (Feet) 100 Feet  x2 with rest break between   Gait Comments pt required verbal instructions to stay close to his walker and heel to toe gait pattern, equalizing step length.   Pt required 2 seated rest breaks during amb                     [facility] Adult PT Treatment/Exercise - 07/13/16 0001      Ambulation/Gait   Ambulation/Gait Assistance 5: Supervision   Assistive device 4-wheeled walker   Gait Pattern Decreased step length -  right;Decreased step length - left;Decreased stride length;Decreased hip/knee flexion - right;Decreased hip/knee flexion - left;Decreased dorsiflexion - right;Decreased dorsiflexion - left;Shuffle;Ataxic;Trunk flexed;Poor foot clearance - left;Poor foot clearance - right   Ambulation Surface Level;Indoor     Exercises   Exercises Knee/Hip     Knee/Hip Exercises: Aerobic   Nustep Level 5 x 10 minutes     Knee/Hip Exercises: Standing   Heel Raises Both;15 reps   Hip Flexion Both;10 reps   Hip ADduction Both;10 reps   Rocker Board --     Knee/Hip Exercises: Seated   Long Arc Quad Strengthening;Both;15 reps;Weights   Long Arc Quad Weight 4 lbs.   Sit to Sand 15 reps;with UE support;Other (comment)  verbal cues for foot placement                PT Education - 07/13/16 1052    Education provided  Yes   Education Details Safe sit to stand technique   Person(s) Educated Patient   Methods Explanation;Demonstration;Verbal cues   Comprehension Verbalized understanding;Returned demonstration          PT Short Term Goals - 07/13/16 1101      PT SHORT TERM GOAL #1   Title Independent with an initial HEP.   Time 4   Period Weeks   Status New     PT SHORT TERM GOAL #2   Title Increase Berg score to 32/56.   Time 4   Period Weeks   Status New           PT Long Term Goals - 07/13/16 1116      PT LONG TERM GOAL #1   Title Independent with an advanced HEP.   Time 8   Period Weeks   Status New     PT LONG TERM GOAL #2   Title Increase bilateral LE strength to a solid 4+/5 to increase stability for functional activites.   Time 8   Period Weeks   Status New     PT LONG TERM GOAL #3   Title Walk in clinic with FWW 500 feet with supervision only without resting and 02 not going below 93%.   Period Weeks   Status New     PT LONG TERM GOAL #4   Title Perform a reciprocating stair gait with one railing.   Period Weeks   Status New     PT LONG TERM GOAL #5   Title Increase Berg score to 44-45/56.   Time 8   Period Weeks   Status New               Plan - 07/13/16 1058    Clinical Impression Statement Patient tolerated ther exercises today well. Pt worked on LE strengthening exercises and standing balance. Pt required incresaed verbal instructions for safe sit to stand. Pt with posterior lean when standing and arrived to therapy pulling up on his rollator walker. Pt was instructed to always push up from his seated surface for safety and we concentrated on foot positioning before standing. Continue with skilled PT as pt tolerates.    Clinical Impairments Affecting Rehab Potential Poor balance.  H/o CVA.   PT Frequency 2x / week   PT Duration 8 weeks   PT Treatment/Interventions ADLs/Self Care Home Management;Functional mobility training;Stair training;Gait  training;Therapeutic activities;Therapeutic exercise;Balance training;Neuromuscular re-education;Patient/family education   PT Next Visit Plan Please monitor BP; HR and 02 sat.  Bilateral LE strengthening to include core exercises.  Gait and balance activites.   Consulted  and Agree with Plan of Care Patient;Family member/caregiver      Patient will benefit from skilled therapeutic intervention in order to improve the following deficits and impairments:  Abnormal gait, Decreased activity tolerance, Decreased balance, Decreased mobility, Decreased coordination, Decreased strength, Difficulty walking  Visit Diagnosis: Unsteadiness on feet  Muscle weakness (generalized)  Frontal lobe and executive function deficit following cerebral infarction  Medication refill  Depression, recurrent (HCC)  Ataxic gait  Other abnormalities of gait and mobility     Problem List Patient Active Problem List   Diagnosis Date Noted  . Frontal lobe and executive function deficit following cerebral infarction 07/02/2016  . Neurologic gait disorder   . Coronary artery disease involving coronary bypass graft of native heart without angina pectoris   . Benign essential HTN   . Acute kidney injury (HCC)   . Nausea & vomiting 06/28/2016  . Ischemic stroke of frontal lobe (HCC) 06/28/2016  . Thrombocytopenia (HCC) 06/28/2016  . Renal failure (ARF), acute on chronic (HCC) 06/28/2016  . Stroke (cerebrum) (HCC) 06/28/2016  . Acute CVA (cerebrovascular accident) (HCC) 06/28/2016  . Insomnia 02/02/2016  . Atrial fibrillation (HCC) 10/27/2015  . Uncoordinated movements 02/24/2014  . Unspecified hereditary and idiopathic peripheral neuropathy 02/24/2013  . Low back pain 02/24/2013  . Abnormality of gait 02/09/2013  . Memory loss, short term 12/05/2011  . LUMBAR STRAIN, ACUTE 05/05/2010  . OSA (obstructive sleep apnea) 09/14/2009  . BPH (benign prostatic hyperplasia) 07/27/2009  . CARDIOVASCULAR STUDIES,  ABNORMAL 07/27/2009  . CHEST PAIN 06/08/2009  . OSTEOARTHRITIS 05/30/2009  . HEEL PAIN, LEFT 05/24/2009  . Dyspnea on exertion 05/24/2009  . ADJUSTMENT DISORDER WITH MIXED FEATURES 08/25/2008  . Diabetes type 2, uncontrolled (HCC) 02/24/2008  . ERECTILE DYSFUNCTION 02/10/2007  . Hyperlipidemia 10/09/2006  . OBESITY 10/09/2006  . Essential hypertension 10/09/2006  . Coronary atherosclerosis 10/09/2006  . GERD 10/09/2006  . DM (diabetes mellitus) type II uncontrolled with retinopathy 06/10/2006    Sharmon Leyden, MPT 07/13/2016, 11:19 AM  [facility] [address] [place], [place], [address] Phone: [phone]   Fax:  [phone]  Name: Luis Yu MRN: [id number] Date of Birth: 10-01-1945

## 2016-07-14 ENCOUNTER — Encounter: Payer: Self-pay | Admitting: Pulmonary Disease

## 2016-07-16 ENCOUNTER — Encounter: Payer: Self-pay | Admitting: Physical Medicine & Rehabilitation

## 2016-07-16 ENCOUNTER — Ambulatory Visit: Payer: Medicare Other | Admitting: Physical Therapy

## 2016-07-16 ENCOUNTER — Encounter: Payer: Self-pay | Admitting: Physical Therapy

## 2016-07-16 ENCOUNTER — Ambulatory Visit (HOSPITAL_BASED_OUTPATIENT_CLINIC_OR_DEPARTMENT_OTHER): Payer: Medicare Other | Admitting: Physical Medicine & Rehabilitation

## 2016-07-16 ENCOUNTER — Encounter: Payer: Medicare Other | Attending: Physical Medicine & Rehabilitation

## 2016-07-16 ENCOUNTER — Encounter: Payer: Medicare Other | Admitting: Physical Medicine & Rehabilitation

## 2016-07-16 VITALS — BP 120/67 | HR 68

## 2016-07-16 DIAGNOSIS — M6281 Muscle weakness (generalized): Secondary | ICD-10-CM

## 2016-07-16 DIAGNOSIS — G4730 Sleep apnea, unspecified: Secondary | ICD-10-CM | POA: Diagnosis not present

## 2016-07-16 DIAGNOSIS — E669 Obesity, unspecified: Secondary | ICD-10-CM | POA: Insufficient documentation

## 2016-07-16 DIAGNOSIS — E1142 Type 2 diabetes mellitus with diabetic polyneuropathy: Secondary | ICD-10-CM | POA: Diagnosis not present

## 2016-07-16 DIAGNOSIS — R269 Unspecified abnormalities of gait and mobility: Secondary | ICD-10-CM | POA: Insufficient documentation

## 2016-07-16 DIAGNOSIS — Z96653 Presence of artificial knee joint, bilateral: Secondary | ICD-10-CM | POA: Diagnosis not present

## 2016-07-16 DIAGNOSIS — I69398 Other sequelae of cerebral infarction: Secondary | ICD-10-CM | POA: Insufficient documentation

## 2016-07-16 DIAGNOSIS — E785 Hyperlipidemia, unspecified: Secondary | ICD-10-CM | POA: Diagnosis not present

## 2016-07-16 DIAGNOSIS — Z760 Encounter for issue of repeat prescription: Secondary | ICD-10-CM | POA: Diagnosis not present

## 2016-07-16 DIAGNOSIS — R2681 Unsteadiness on feet: Secondary | ICD-10-CM | POA: Diagnosis not present

## 2016-07-16 DIAGNOSIS — Z8249 Family history of ischemic heart disease and other diseases of the circulatory system: Secondary | ICD-10-CM | POA: Diagnosis not present

## 2016-07-16 DIAGNOSIS — F339 Major depressive disorder, recurrent, unspecified: Secondary | ICD-10-CM | POA: Diagnosis not present

## 2016-07-16 DIAGNOSIS — I10 Essential (primary) hypertension: Secondary | ICD-10-CM | POA: Diagnosis not present

## 2016-07-16 DIAGNOSIS — I69319 Unspecified symptoms and signs involving cognitive functions following cerebral infarction: Secondary | ICD-10-CM | POA: Insufficient documentation

## 2016-07-16 DIAGNOSIS — F329 Major depressive disorder, single episode, unspecified: Secondary | ICD-10-CM | POA: Insufficient documentation

## 2016-07-16 DIAGNOSIS — I69314 Frontal lobe and executive function deficit following cerebral infarction: Secondary | ICD-10-CM

## 2016-07-16 DIAGNOSIS — Z823 Family history of stroke: Secondary | ICD-10-CM | POA: Insufficient documentation

## 2016-07-16 DIAGNOSIS — I2510 Atherosclerotic heart disease of native coronary artery without angina pectoris: Secondary | ICD-10-CM | POA: Diagnosis not present

## 2016-07-16 DIAGNOSIS — M1990 Unspecified osteoarthritis, unspecified site: Secondary | ICD-10-CM | POA: Diagnosis not present

## 2016-07-16 DIAGNOSIS — R350 Frequency of micturition: Secondary | ICD-10-CM | POA: Insufficient documentation

## 2016-07-16 DIAGNOSIS — R260 Ataxic gait: Secondary | ICD-10-CM | POA: Diagnosis not present

## 2016-07-16 NOTE — Patient Instructions (Signed)
Recommend change from celexa to mirtzapine, no more than   Urinary frequency may be spastic bladder due to stroke May need urology evaluation  Consider myrbetriq rather than oxybutnin due to less confusion

## 2016-07-16 NOTE — Progress Notes (Signed)
Subjective:  Transitional care. Follow-up phone call performed 07/09/2016 Discharge from my service in the hospital, 07/07/2016  Patient ID: Luis Yu, male    DOB: 1945/04/02, 71 y.o.   MRN: [id number] 71 year old right-handed male, history of bilateral total knee replacement, CAD with CABG, chronic atrial fibrillation, maintained on Eliquis.  Lives with spouse, independent with a walker prior to admission.  Presented on June 28, 2016, after being referred to the emergency room by primary care physician after a fall while at the office with persistent nausea and vomiting.  CT of the head and neck unremarkable, plan was for discharge to home and while waiting outside for his wife to pick him up with ongoing nausea and vomiting.  A repeat CT scan again negative, blood pressure 130/110.  MRI of the brain showed a punctate focus of acute ischemia within the posterior left frontal white matter without hemorrhage or mass effect.  Chronic microvascular ischemia and old lacunar infarctions.  MRA unremarkable.  Carotid Dopplers with mild-to- moderate heterogeneous plaque.  Echocardiogram with ejection fraction of 60%.  DATE OF ADMISSION:  07/02/2016 DATE OF DISCHARGE:  07/07/2016 HPI  Mod I dressing and bathing Amb with walker No falls, needs constant cueing from his wife to use the walker. Even prior to his stroke. He had falls when he did not use the walker. We discussed his CT and MRI scan findings. He has multiple chronic lacunar infarcts and we discussed that this is the likely cause of his chronic cognitive deficits and gait disorder. OP PT, recorded some SOB figiting with fingers at night  PCP visit tomorrow  Pain Inventory Average Pain 5 Pain Right Now 5 My pain is tingling and aching  In the last 24 hours, has pain interfered with the following? General activity 5 Relation with others 5 Enjoyment of life 5 What TIME of day is your pain at its worst? night Sleep (in  general) Fair  Pain is worse with: walking and standing Pain improves with: medication Relief from Meds: 5  Mobility walk without assistance walk with assistance use a walker needs help with transfers  Function retired  Neuro/Psych bladder control problems weakness tremor tingling trouble walking  Prior Studies Any changes since last visit?  no  Physicians involved in your care Any changes since last visit?  no   Family History  Problem Relation Age of Onset  . Coronary artery disease Mother     deceased at 33  . Coronary artery disease Father     died age 62  . Stroke      half brother  . Stroke Brother    Social History   Social History  . Marital status: Married    Spouse name: [name]  . Number of children: 1  . Years of education: 46   Occupational History  . Retired Naval architect Retired   Social History Main Topics  . Smoking status: Never Smoker  . Smokeless tobacco: Never Used  . Alcohol use 1.2 oz/week    2 Standard drinks or equivalent per week     Comment: twice a month.  . Drug use: No  . Sexual activity: Yes    Partners: Female   Other Topics Concern  . Not on file   Social History Narrative   Married [name])   Regular exercise: none   Caffeine use: cup of coffee daily    Retired Naval architect   Caffeine- twice daily  Past Surgical History:  Procedure Laterality Date  . CARDIOVERSION N/A 11/10/2015   Procedure: CARDIOVERSION;  Surgeon: Laurey Morale, MD;  Location: [facility] ENDOSCOPY;  Service: Cardiovascular;  Laterality: N/A;  . CARDIOVERSION N/A 02/21/2016   Procedure: CARDIOVERSION;  Surgeon: Lewayne Bunting, MD;  Location: [facility] ENDOSCOPY;  Service: Cardiovascular;  Laterality: N/A;  . CORONARY ARTERY BYPASS GRAFT  1996   eith a LIMA to the LAD, saphenous vein graft to the acute marginal, saphenous vein graft to the PDA and saphenous vein graft to the circumflex.  [name] KNEE ARTHROSCOPY  05/2004   right knee  .  REPLACEMENT TOTAL KNEE BILATERAL    . SHOULDER SURGERY     Past Medical History:  Diagnosis Date  . Adjustment disorder with mixed emotional features   . Atrial flutter (HCC)   . CAD (coronary artery disease)   . Depression   . Diabetes mellitus type II, uncontrolled (HCC)   . Diabetic peripheral neuropathy (HCC)   . Erectile dysfunction   . GERD (gastroesophageal reflux disease)   . Herpes zoster   . Hyperlipidemia   . Hypertension   . Obesity   . Osteoarthritis   . Postherpetic neuralgia   . Sleep apnea    There were no vitals taken for this visit.  Opioid Risk Score:   Fall Risk Score:  `1  Depression screen PHQ 2/9  Depression screen [facility] 2/9 11/03/2015 05/13/2015 02/24/2014  Decreased Interest 0 0 0  Down, Depressed, Hopeless 0 0 0  PHQ - 2 Score 0 0 0  Some recent data might be hidden    Review of Systems  Constitutional: Positive for appetite change and unexpected weight change.  HENT: Negative.   Eyes: Negative.   Respiratory: Positive for apnea and shortness of breath.   Cardiovascular: Negative.   Gastrointestinal: Positive for constipation.  Endocrine: Negative.   Genitourinary: Negative.   Musculoskeletal: Negative.   Skin: Negative.   Allergic/Immunologic: Negative.   Neurological: Negative.   Hematological: Negative.   Psychiatric/Behavioral: Negative.   All other systems reviewed and are negative.      Objective:   Physical Exam  Constitutional: He appears well-developed and well-nourished.  HENT:  Head: Normocephalic and atraumatic.  Cardiovascular: Normal rate, regular rhythm and normal heart sounds.   Pulmonary/Chest: Effort normal and breath sounds normal.  Abdominal: Soft. Bowel sounds are normal. He exhibits no distension.  Musculoskeletal: Normal range of motion.  Neurological: He is alert.  Psychiatric: He has a normal mood and affect. His behavior is normal.  Nursing note and vitals reviewed.  Motor strength is 5/5 bilateral  deltoid, bicep, tricep grip. Sit to stand is with supervision. He has a wide base of support. He tends to lean backward. He steadies himself with the wall. Speech is without evidence of dysarthria or aphasia.        Assessment & Plan:  1. Cognitive deficits following CVA. Given history and evidence of multiple periventricular white matter infarcts, he likely will have permanent deficits. 2. Gait disorder following stroke. Similarly, this has been slowly cumulative. This last stroke was not  large, but superimposed on his other chronic infarcts. Discussed chronic need for walker for safety given history of falls even prior to the most recent stroke. We looked at his scans. He was not aware that he had prior infarcts, nor was his wife.  Physical medicine and rehabilitation follow-up on an as-needed basis We'll need to follow up with neurology Follow-up with PCP tomorrow  Other issues  include poor sleep, according to patient's wife, PCP was thinking about using mirtazapine, I am in agreement with this.   3. Urinary frequency, no burning, no fevers. Likely has spastic bladder. Post stroke but also may have some underlying BPH. With his vascular dementia,would avoid any anticholinergic agents. May do okay with Myrbetriq , which is beta. 3. Adrenergic receptor agonist, but ultimately may need urological consultation.

## 2016-07-16 NOTE — Therapy (Signed)
[name] [name], [name], [address] Phone: [phone]   Fax:  [phone]  Physical Therapy Treatment  Patient Details  Name: Luis Yu MRN: [id number] Date of Birth: 1945/09/19 Referring Provider: Alysia Penna, MD  Encounter Date: 07/16/2016      PT End of Session - 07/16/16 0926    Visit Number 3   Number of Visits 16   Date for PT Re-Evaluation 09/08/16   PT Start Time 7564   PT Stop Time 0943   PT Time Calculation (min) 44 min   Equipment Utilized During Treatment Gait belt   Activity Tolerance Patient tolerated treatment well;Other (comment)  limited by shortness of breath   Behavior During Therapy [facility] for tasks assessed/performed      Past Medical History:  Diagnosis Date  . Adjustment disorder with mixed emotional features   . Atrial flutter ([place])   . CAD (coronary artery disease)   . Depression   . Diabetes mellitus type II, uncontrolled ([place])   . Diabetic peripheral neuropathy ([place])   . Erectile dysfunction   . GERD (gastroesophageal reflux disease)   . Herpes zoster   . Hyperlipidemia   . Hypertension   . Obesity   . Osteoarthritis   . Postherpetic neuralgia   . Sleep apnea     Past Surgical History:  Procedure Laterality Date  . CARDIOVERSION N/A 11/10/2015   Procedure: CARDIOVERSION;  Surgeon: Larey Dresser, MD;  Location: [place];  Service: Cardiovascular;  Laterality: N/A;  . CARDIOVERSION N/A 02/21/2016   Procedure: CARDIOVERSION;  Surgeon: Lelon Perla, MD;  Location: [facility] ENDOSCOPY;  Service: Cardiovascular;  Laterality: N/A;  . CORONARY ARTERY BYPASS GRAFT  1996   eith a LIMA to the LAD, saphenous vein graft to the acute marginal, saphenous vein graft to the PDA and saphenous vein graft to the circumflex.  [name] KNEE ARTHROSCOPY  05/2004   right knee  . REPLACEMENT TOTAL KNEE BILATERAL    . SHOULDER SURGERY      There were no vitals filed for this visit.       Subjective Assessment - 07/16/16 0904    Subjective Patient reported doing good after last treatment and no falls reported   Pertinent History Recent falls.  Peripheral neuropathy. Chronic atrial fib.; bil TKA's.   How long can you walk comfortably? Short distances with a FWW.   Diagnostic tests MRI; CT   Currently in Pain? No/denies                         [facility] Adult PT Treatment/Exercise - 07/16/16 0001      Knee/Hip Exercises: Aerobic   Nustep Level 5 x 10 minutes UE/LE activity, monitored for progression  O2 95%, HR 73, BP 115/75 after exercise     Knee/Hip Exercises: Standing   Heel Raises Both;1 set;10 reps   Hip Flexion Stengthening;Both;Limitations  x6 on each LE then rest break     Knee/Hip Exercises: Seated   Long Arc Quad Strengthening;Both;Weights  2x10 each LE, verbal cues  for slow pace and technique   Long Arc Quad Weight 4 lbs.   Marching Limitations 2x10   Marching Weights 4 lbs.   Hamstring Curl Strengthening;Both;1 set;10 reps  green t-band     Knee/Hip Exercises: Supine   Bridges Limitations 2x10   Straight Leg Raise with External Rotation Both;1 set;10 reps;Strengthening  PT Education - 07/16/16 0936    Education provided Yes   Education Details HEP    Person(s) Educated Patient   Methods Explanation;Demonstration;Handout   Comprehension Verbalized understanding;Returned demonstration          PT Short Term Goals - 07/16/16 0928      PT SHORT TERM GOAL #1   Title Independent with an initial HEP.   Time 4   Period Weeks   Status Achieved     PT SHORT TERM GOAL #2   Title Increase Berg score to 32/56.   Time 4   Period Weeks   Status On-going           PT Long Term Goals - 07/16/16 0930      PT LONG TERM GOAL #1   Title Independent with an advanced HEP.   Time 8   Period Weeks   Status On-going     PT LONG TERM GOAL #2   Title Increase bilateral LE strength to a solid 4+/5 to increase  stability for functional activites.   Time 8   Period Weeks   Status On-going     PT LONG TERM GOAL #3   Title Walk in clinic with FWW 500 feet with supervision only without resting and 02 not going below 93%.   Time 8   Period Weeks   Status On-going     PT LONG TERM GOAL #4   Title Perform a reciprocating stair gait with one railing.   Time 8   Period Weeks   Status On-going     PT LONG TERM GOAL #5   Title Increase Berg score to 44-45/56.   Time 8   Period Weeks   Status On-going               Plan - 07/16/16 0941    Clinical Impression Statement Patient tolerated treatment fairly well yet limited by shortness of breath and required rest breaks throughout treatment. Patient vitals during and post treatment WNL today. Patient able to perform exercises with verbal cues for technique to stay on task and for pace. Patient able to perform supine and sitting with greateer ease and less breaks required. Standing exercises limited by SOB per patient. HEP given to patient and wife. Patient met STG #1 others STG's and LTG's ongoing due to balance and strength deficts.   Clinical Impairments Affecting Rehab Potential Poor balance.  H/o CVA.   PT Frequency 2x / week   PT Duration 8 weeks   PT Treatment/Interventions ADLs/Self Care Home Management;Functional mobility training;Stair training;Gait training;Therapeutic activities;Therapeutic exercise;Balance training;Neuromuscular re-education;Patient/family education   PT Next Visit Plan Please monitor BP; HR and 02 sat.  Bilateral LE strengthening to include core exercises.  Gait and balance activites.   Consulted and Agree with Plan of Care Patient      Patient will benefit from skilled therapeutic intervention in order to improve the following deficits and impairments:  Abnormal gait, Decreased activity tolerance, Decreased balance, Decreased mobility, Decreased coordination, Decreased strength, Difficulty walking  Visit  Diagnosis: Unsteadiness on feet  Muscle weakness (generalized)     Problem List Patient Active Problem List   Diagnosis Date Noted  . Frontal lobe and executive function deficit following cerebral infarction 07/02/2016  . Neurologic gait disorder   . Coronary artery disease involving coronary bypass graft of native heart without angina pectoris   . Benign essential HTN   . Acute kidney injury ([place])   . Nausea & vomiting 06/28/2016  .  Ischemic stroke of frontal lobe ([place]) 06/28/2016  . Thrombocytopenia ([place]) 06/28/2016  . Renal failure (ARF), acute on chronic (HCC) 06/28/2016  . Stroke (cerebrum) ([place]) 06/28/2016  . Acute CVA (cerebrovascular accident) ([place]) 06/28/2016  . Insomnia 02/02/2016  . Atrial fibrillation ([place]) 10/27/2015  . Uncoordinated movements 02/24/2014  . Unspecified hereditary and idiopathic peripheral neuropathy 02/24/2013  . Low back pain 02/24/2013  . Abnormality of gait 02/09/2013  . Memory loss, short term 12/05/2011  . LUMBAR STRAIN, ACUTE 05/05/2010  . OSA (obstructive sleep apnea) 09/14/2009  . BPH (benign prostatic hyperplasia) 07/27/2009  . CARDIOVASCULAR STUDIES, ABNORMAL 07/27/2009  . CHEST PAIN 06/08/2009  . OSTEOARTHRITIS 05/30/2009  . HEEL PAIN, LEFT 05/24/2009  . Dyspnea on exertion 05/24/2009  . ADJUSTMENT DISORDER WITH MIXED FEATURES 08/25/2008  . Diabetes type 2, uncontrolled ([place]) 02/24/2008  . ERECTILE DYSFUNCTION 02/10/2007  . Hyperlipidemia 10/09/2006  . OBESITY 10/09/2006  . Essential hypertension 10/09/2006  . Coronary atherosclerosis 10/09/2006  . GERD 10/09/2006  . DM (diabetes mellitus) type II uncontrolled with retinopathy 06/10/2006    Alesa Echevarria P, PTA 07/16/2016, 9:58 AM  [facility] [place], [name], [address] Phone: [phone]   Fax:  [phone]  Name: Luis Yu MRN: [id number] Date of Birth: Jul 18, 1945

## 2016-07-16 NOTE — Patient Instructions (Signed)
  High Stepping   Using support, lift knees, taking high steps. Repeat __5-10__ times. Do __1__ sessions per day.   Bridging   Slowly raise buttocks from floor, keeping stomach tight. Repeat _10___ times per set. Do __2__ sets per session. Do __2__ sessions per day.   Straight Leg Raise   Tighten stomach and slowly raise locked right leg __4__ inches from floor. Repeat __10__ times per set. Do __2__ sets per session. Do __2__ sessions per day.

## 2016-07-17 ENCOUNTER — Ambulatory Visit (INDEPENDENT_AMBULATORY_CARE_PROVIDER_SITE_OTHER): Payer: Medicare Other | Admitting: Pulmonary Disease

## 2016-07-17 ENCOUNTER — Encounter: Payer: Self-pay | Admitting: Pulmonary Disease

## 2016-07-17 ENCOUNTER — Ambulatory Visit (INDEPENDENT_AMBULATORY_CARE_PROVIDER_SITE_OTHER): Payer: Medicare Other | Admitting: Adult Health

## 2016-07-17 ENCOUNTER — Telehealth: Payer: Self-pay | Admitting: Physical Medicine & Rehabilitation

## 2016-07-17 ENCOUNTER — Encounter: Payer: Self-pay | Admitting: Adult Health

## 2016-07-17 ENCOUNTER — Other Ambulatory Visit: Payer: Self-pay

## 2016-07-17 VITALS — BP 146/64 | HR 65 | Temp 98.0°F | Wt 218.9 lb

## 2016-07-17 DIAGNOSIS — R351 Nocturia: Secondary | ICD-10-CM

## 2016-07-17 DIAGNOSIS — I639 Cerebral infarction, unspecified: Secondary | ICD-10-CM

## 2016-07-17 DIAGNOSIS — G4733 Obstructive sleep apnea (adult) (pediatric): Secondary | ICD-10-CM | POA: Diagnosis not present

## 2016-07-17 DIAGNOSIS — F5104 Psychophysiologic insomnia: Secondary | ICD-10-CM | POA: Diagnosis not present

## 2016-07-17 DIAGNOSIS — F5101 Primary insomnia: Secondary | ICD-10-CM

## 2016-07-17 DIAGNOSIS — I69393 Ataxia following cerebral infarction: Secondary | ICD-10-CM | POA: Diagnosis not present

## 2016-07-17 DIAGNOSIS — R5383 Other fatigue: Secondary | ICD-10-CM | POA: Diagnosis not present

## 2016-07-17 DIAGNOSIS — F331 Major depressive disorder, recurrent, moderate: Secondary | ICD-10-CM | POA: Diagnosis not present

## 2016-07-17 DIAGNOSIS — N401 Enlarged prostate with lower urinary tract symptoms: Secondary | ICD-10-CM

## 2016-07-17 LAB — GLUCOSE, POCT (MANUAL RESULT ENTRY): POC Glucose: 210 mg/dl — AB (ref 70–99)

## 2016-07-17 MED ORDER — MIRTAZAPINE 15 MG PO TABS: 15.0000 mg | ORAL_TABLET | Freq: Every day | ORAL | 1 refills | Status: DC

## 2016-07-17 NOTE — Progress Notes (Signed)
HPI: FU CAD and atrial flutter. Abdominal ultrasound in 2003 showed no aneurysm. Patient has had previous PCI of the saphenous vein graft to the PDA  Cardiac cath in Dec of 2011 revealed EF of 50. Continued patency internal mammary to the LAD. Occluded saphenous vein graft to the recent percutaneous intervention to the distal circumflex/PDA territory. Progressive disease in the saphenous vein graft to the OM. Patient evaluated by CVTS. Distal vessels felt to be poor targets. CABG could be considered in the future if PCI options were exhausted. Patient had PCI of the saphenous vein graft to the obtuse marginal. Nuclear study August 2017 showed ejection fraction 48% and no ischemia or infarction. Patient had cardioversion November 10 2015 and February 21 2016. Patient seen by Dr. Johney Frame for consideration of ablation and rate control recommended. Tikosyn could be considered. Patient had CVA April 2018.  Echocardiogram repeated April 2018 and showed normal LV systolic function, moderate left ventricular hypertrophy, mild biatrial enlargement, mild mitral regurgitation and moderately elevated pulmonary pressure. Carotid Dopplers April 2018 showed 1-39% bilateral stenosis. Since last seen, he is slowly recovering from his CVA. He denies chest pain, palpitations or syncope. He continues to have dyspnea on exertion unchanged. No orthopnea, PND or pedal edema.  Current Outpatient Prescriptions  Medication Sig Dispense Refill  . acetaminophen (TYLENOL) 500 MG tablet Take 2 tablets (1,000 mg total) by mouth every 6 (six) hours as needed. (Patient taking differently: Take 1,000 mg by mouth every 6 (six) hours as needed for mild pain. ) 30 tablet 0  . apixaban (ELIQUIS) 5 MG TABS tablet Take 1 tablet (5 mg total) by mouth 2 (two) times daily. 60 tablet 11  . aspirin EC 81 MG tablet Take 1 tablet (81 mg total) by mouth daily.    [name] atorvastatin (LIPITOR) 80 MG tablet Take 1 tablet (80 mg total) by mouth daily. 90  tablet 3  . buPROPion (WELLBUTRIN SR) 150 MG 12 hr tablet Take 1 tablet (150 mg total) by mouth 2 (two) times daily. 180 tablet 1  . glipiZIDE (GLUCOTROL) 10 MG tablet Take 10 mg by mouth daily.    [name] glucose blood (ACCU-CHEK ACTIVE STRIPS) test strip Use as instructed 100 each 12  . insulin aspart (NOVOLOG) 100 UNIT/ML injection Correction coverage: Sensitive (thin, NPO, renal)  CBG < 70: implement hypoglycemia protocol  CBG 70 - 120: 0 units  CBG 121 - 150: 1 unit  CBG 151 - 200: 2 units  CBG 201 - 250: 3 units  CBG 251 - 300: 5 units  CBG 301 - 350: 7 units  CBG 351 - 400 9 units  CBG > 400 call MD and obtain STAT lab verification 10 mL 11  . Insulin Glargine (BASAGLAR KWIKPEN) 100 UNIT/ML SOPN Inject 0.25 mLs (25 Units total) into the skin at bedtime. (Patient taking differently: Inject 20 Units into the skin at bedtime. ) 3 pen 11  . Insulin Pen Needle (PEN NEEDLES 31GX5/16") 31G X 8 MM MISC Use to inject insulin 5 times a day. 100 each 11  . isosorbide mononitrate (IMDUR) 30 MG 24 hr tablet Take 1 tablet (30 mg total) by mouth daily. 30 tablet 12  . Lancets (ACCU-CHEK MULTICLIX) lancets Use up to 5 times a day to check blood sugar. 100 each 5  . metFORMIN (GLUCOPHAGE) 500 MG tablet Take 1 tablet (500 mg total) by mouth 2 (two) times daily with a meal. 60 tablet 1  . mirtazapine (REMERON) 15 MG  tablet Take 1 tablet (15 mg total) by mouth at bedtime. 90 tablet 1  . nebivolol 20 MG TABS Take 1 tablet (20 mg total) by mouth daily. 30 tablet 1  . omeprazole (PRILOSEC) 20 MG capsule TAKE ONE CAPSULE TWICE A DAY BEFORE A MEAL 180 capsule 0  . thiamine 100 MG tablet Take 1 tablet (100 mg total) by mouth daily. 30 tablet 1  . vitamin B-12 (CYANOCOBALAMIN) 1000 MCG tablet Take 2 tablets (2,000 mcg total) by mouth daily. 60 tablet 1   No current facility-administered medications for this visit.      Past Medical History:  Diagnosis Date  . Adjustment disorder with mixed emotional features     . Atrial flutter (HCC)   . CAD (coronary artery disease)   . Depression   . Diabetes mellitus type II, uncontrolled (HCC)   . Diabetic peripheral neuropathy (HCC)   . Erectile dysfunction   . GERD (gastroesophageal reflux disease)   . Herpes zoster   . Hyperlipidemia   . Hypertension   . Obesity   . Osteoarthritis   . Postherpetic neuralgia   . Sleep apnea     Past Surgical History:  Procedure Laterality Date  . CARDIOVERSION N/A 11/10/2015   Procedure: CARDIOVERSION;  Surgeon: Laurey Morale, MD;  Location: [facility] ENDOSCOPY;  Service: Cardiovascular;  Laterality: N/A;  . CARDIOVERSION N/A 02/21/2016   Procedure: CARDIOVERSION;  Surgeon: Lewayne Bunting, MD;  Location: [facility] ENDOSCOPY;  Service: Cardiovascular;  Laterality: N/A;  . CORONARY ARTERY BYPASS GRAFT  1996   eith a LIMA to the LAD, saphenous vein graft to the acute marginal, saphenous vein graft to the PDA and saphenous vein graft to the circumflex.  [name] KNEE ARTHROSCOPY  05/2004   right knee  . REPLACEMENT TOTAL KNEE BILATERAL    . SHOULDER SURGERY      Social History   Social History  . Marital status: Married    Spouse name: [name]  . Number of children: 1  . Years of education: 36   Occupational History  . Retired Naval architect Retired   Social History Main Topics  . Smoking status: Never Smoker  . Smokeless tobacco: Never Used  . Alcohol use 1.2 oz/week    2 Standard drinks or equivalent per week     Comment: twice a month.  . Drug use: No  . Sexual activity: Yes    Partners: Female   Other Topics Concern  . Not on file   Social History Narrative   Married [name])   Regular exercise: none   Caffeine use: cup of coffee daily    Retired Naval architect   Caffeine- twice daily             Family History  Problem Relation Age of Onset  . Coronary artery disease Mother     deceased at 41  . Coronary artery disease Father     died age 51  . Stroke      half brother  . Stroke Brother     ROS:  no fevers or chills, productive cough, hemoptysis, dysphasia, odynophagia, melena, hematochezia, dysuria, hematuria, rash, seizure activity, orthopnea, PND, pedal edema, claudication. Remaining systems are negative.  Physical Exam: Well-developed well-nourished in no acute distress.  Skin is warm and dry.  HEENT is normal.  Neck is supple. No bruits Chest is clear to auscultation with normal expansion.  Cardiovascular exam is irregular Abdominal exam nontender or distended. No masses palpated. Extremities show no edema. neuro  grossly intact   A/P  1 atrial flutter-plan is for rate control and anticoagulation. Continue beta blocker and apixaban.   2 coronary artery disease-continue statin and aspirin.  3 hypertension-blood pressure controlled. Continue present medications.  4 hyperlipidemia-continue statin.  5 recent CVA-continue anticoagulation. He is undergoing physical therapy and improving.  Olga Millers, MD

## 2016-07-17 NOTE — Patient Instructions (Addendum)
Discuss Celexa with Luis Yu- this is a daytime drug  Continue using your machine and try to get a good mask seal Repeat download in 3 months

## 2016-07-17 NOTE — Assessment & Plan Note (Signed)
Melatonin has not worked. Would consider mirtazapine or trazodone -Celexa should only be taken in the daytime and may even need to be stopped, defer to PCP

## 2016-07-17 NOTE — Patient Instructions (Addendum)
I am going to wean you off Celexa. 1. Cut the pill in half and take that dose for a week  2. During the second week skip a day between doses 3. During the third week, skip two days between doses 4. During the fourth week, skip four days  5. During the fifth week, stop the medication  Continue with Wellbutrin and start the Remeron tonight.

## 2016-07-17 NOTE — Progress Notes (Signed)
   Subjective:    Patient ID: Luis Yu, male    DOB: [birth year], 71 y.o.   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 Follow-up    6-8 wk f/u. Pt had a stroke in early April. States still having breathing issues. Uses [facility].    Unfortunately he had a stroke 06/2016, MRI also showed old lacunes. He is in the wheelchair today and uses a walker to ambulate, undergoing physical therapy  He had several residual central events on CPAP and persisted in spite of increasing CPAP settings (to auto), hence underwent titration with ASV machine. Got this machine 03/2016 but downloads since then have shown persistent centrals and residual events He was sent for a mask fitting session and given a new full face mask but continues to have significant leak on download  He continues to have  intermittent dyspnea he has been evaluated by cardiology and EP- not felt to be a candidate for ablation, rate control and anticoagulation was recommended  Also complains of insomnia, taking Celexa at night     Significant tests/ events   PSG 6/27 /11 - wt 246 lbs -showed severe obstructive sleep apnea with AHI 50/h &desaturation to 87%, corrected by CPAP 10 cm - he did not tolerate a full face mask   Jan 2012 download on 12 cm shows leak ++, residual AHI 21/h,centrals 5/h, good usage Download on 12 cm 3/6-06/20/10 shows increased centrals 11/h, with AHI 28/h, hypopneas 11/h  2D Echo >done 11/301/7 with nl ef/ Mod LAE/ RAE  Referral to Cardiology >atrial flutter cardioverted again 02/21/16 only a little less sob  CT Angio Chest 02/16/16: No acute pulmonary embolus. Trace left and small right pleural  effusions   03/2016 Trial of SV Advance EPAP 7 cmH2O, Pressure Support Min 4 and Max 15 cmH2O with Max Pressure of 15 cmH2O    Review of Systems neg for any significant sore throat, dysphagia, itching, sneezing, nasal congestion or excess/ purulent secretions, fever, chills, sweats, unintended wt loss, pleuritic or exertional cp, hempoptysis, orthopnea pnd or change in chronic leg swelling. Also denies presyncope, palpitations, heartburn, abdominal pain, nausea, vomiting, diarrhea or change in bowel or urinary habits, dysuria,hematuria, rash, arthralgias, visual complaints, headache, numbness weakness or ataxia.     Objective:   Physical Exam   Gen. Pleasant, well-nourished, in no distress, in wheelchair ENT - no thrush, no post nasal drip Neck: No JVD, no thyromegaly, no carotid bruits Lungs: no use of accessory muscles, no dullness to percussion, clear without rales or rhonchi  Cardiovascular: Rhythm regular, heart sounds  normal, no murmurs or gallops, no peripheral edema Musculoskeletal: No deformities, no cyanosis or clubbing         Assessment & Plan:

## 2016-07-17 NOTE — Progress Notes (Unsigned)
Order pended and re ordered today 05/03 and new order signed today. Removed this original order due to duplication  Patient in to see Luis Yu today Was having difficulty walking with walker to the lab. Wife states he his post stroke, going to outpatient rehab. Having difficulty managing at home. Wife feels overwhelmed. No support in place. Wife states his atrial fib making him SOB and is "impulsive" and can't stand without assistance.   Agreed to see if [facility] can assess for nursing or SW and overall management and resources through recover.   Will place referral to [facility]  Will pend order for [facility] to review

## 2016-07-17 NOTE — Assessment & Plan Note (Signed)
Unclear why he continues to have respiratory events and his download These appear to be much improved on ASV titration but we have not been able to replicate results of his study during actual use  We'll continue same settings on ASV machine and repeat download in 3 months to see if central apneas settle down with time

## 2016-07-17 NOTE — Progress Notes (Signed)
Subjective:    Patient ID: Luis Yu, male    DOB: [birth year], 71 y.o.   MRN: [id number]  HPI  71 year old male who  has a past medical history of Adjustment disorder with mixed emotional features; Atrial flutter (HCC); CAD (coronary artery disease); Depression; Diabetes mellitus type II, uncontrolled (HCC); Diabetic peripheral neuropathy (HCC); Erectile dysfunction; GERD (gastroesophageal reflux disease); Herpes zoster; Hyperlipidemia; Hypertension; Obesity; Osteoarthritis; Postherpetic neuralgia; and Sleep apnea.  He presents to the office today for hospital follow up. His wife is with him   He was admitted on 07/02/2016 and discharged on 07/07/2016   Per discharge notes:   This is a 71 year old right-handed male,history of bilateral total knee replacement, CAD with CABG, chronic atrial fibrillation, maintained on Eliquis.  Lives with spouse, independent with a walker prior to admission.  Presented on June 28, 2016, after being referred to the emergency room by primary care physician after a fall while at the office with persistent nausea and vomiting.  CT of the head and neck unremarkable, plan was for discharge to home and while waiting outside for his wife to pick him up with ongoing nausea and vomiting.  A repeat CT scan again negative, blood pressure 130/110.  MRI of the brain showed a punctate focus of acute ischemia within the posterior left frontal white matter without hemorrhage or mass effect.  Chronic microvascular ischemia and old lacunar infarctions.  MRA unremarkable.  Carotid Dopplers with mild-to- moderate heterogeneous plaque.  Echocardiogram with ejection fraction of 60%.  No wall motion abnormalities.  EEG negative.  The patient was admitted for a comprehensive rehab program.   Today in the office he reports that he is not feeling well, he continues to be fatigued and does not feel as though he is at baseline. He and his wife both report that he is not sleeping well at  night and is maybe getting 1-2 hours sleep. He continues to feel depressed and feels as though his wife is "badgering him" over his health.   His wife reports that she feels overwhelmed at home. She believes that he was discharged too soon from the hospital and she does not feel like she can take care of him 24 hours a day while at home. His wife also reports that [name] is not using his rolling walker all the time and this worries her as she is afraid that he will have another fall. She is frustrated at [facility] for not following directions and they argued throughout the exam today.   He has completed therapy with Dr. Wynn Banker and is currently doing outpatient PT. He feels unsteady on his feet.   [name] and his wife also report that he has been having to urinate a lot more often, especially in the middle of the night. [name] reports that he is getting up 6 times a night to urinate.   He denies any blurred vision, headaches, fevers, n/v/d since being discharged.    Review of Systems  Constitutional: Positive for activity change, appetite change and fatigue. Negative for fever and unexpected weight change.  HENT: Negative.   Respiratory: Positive for shortness of breath.   Cardiovascular: Negative.   Gastrointestinal: Negative.   Musculoskeletal: Positive for gait problem.  Neurological: Positive for weakness.  Hematological: Negative.   Psychiatric/Behavioral: Positive for agitation and sleep disturbance. Negative for suicidal ideas.   Past Medical History:  Diagnosis Date  . Adjustment disorder with mixed emotional features   . Atrial flutter (HCC)   .  CAD (coronary artery disease)   . Depression   . Diabetes mellitus type II, uncontrolled (HCC)   . Diabetic peripheral neuropathy (HCC)   . Erectile dysfunction   . GERD (gastroesophageal reflux disease)   . Herpes zoster   . Hyperlipidemia   . Hypertension   . Obesity   . Osteoarthritis   . Postherpetic neuralgia   . Sleep apnea      Social History   Social History  . Marital status: Married    Spouse name: Luis Yu  . Number of children: 1  . Years of education: 23   Occupational History  . Retired Naval architect Retired   Social History Main Topics  . Smoking status: Never Smoker  . Smokeless tobacco: Never Used  . Alcohol use 1.2 oz/week    2 Standard drinks or equivalent per week     Comment: twice a month.  . Drug use: No  . Sexual activity: Yes    Partners: Female   Other Topics Concern  . Not on file   Social History Narrative   Married Luis Yu)   Regular exercise: none   Caffeine use: cup of coffee daily    Retired Naval architect   Caffeine- twice daily             Past Surgical History:  Procedure Laterality Date  . CARDIOVERSION N/A 11/10/2015   Procedure: CARDIOVERSION;  Surgeon: Laurey Morale, MD;  [facility] END[facility];  Service: Cardiovascular;  Laterality: N/A;  . CARDIOVERSION N/A 02/21/2016   Procedure: CARDIOVERSION;  Surgeon: Lewayne Bunting, MD;  Location: [facility] ENDOSCOPY;  Service: Cardiovascular;  Laterality: N/A;  . CORONARY ARTERY BYPASS GRAFT  1996   eith a LIMA to the LAD, saphenous vein graft to the acute marginal, saphenous vein graft to the PDA and saphenous vein graft to the circumflex.  [name] KNEE ARTHROSCOPY  05/2004   right knee  . REPLACEMENT TOTAL KNEE BILATERAL    . SHOULDER SURGERY      Family History  Problem Relation Age of Onset  . Coronary artery disease Mother     deceased at 59  . Coronary artery disease Father     died age 18  . Stroke      half brother  . Stroke Brother     No Known Allergies  Current Outpatient Prescriptions on File Prior to Visit  Medication Sig Dispense Refill  . acetaminophen (TYLENOL) 500 MG tablet Take 2 tablets (1,000 mg total) by mouth every 6 (six) hours as needed. (Patient taking differently: Take 1,000 mg by mouth every 6 (six) hours as needed for mild pain. ) 30 tablet 0  . apixaban (ELIQUIS) 5 MG TABS tablet Take  1 tablet (5 mg total) by mouth 2 (two) times daily. 60 tablet 11  . aspirin EC 81 MG tablet Take 1 tablet (81 mg total) by mouth daily.    [name] atorvastatin (LIPITOR) 80 MG tablet Take 1 tablet (80 mg total) by mouth daily. 90 tablet 3  . buPROPion (WELLBUTRIN SR) 150 MG 12 hr tablet Take 1 tablet (150 mg total) by mouth 2 (two) times daily. 180 tablet 1  . glucose blood (ACCU-CHEK ACTIVE STRIPS) test strip Use as instructed 100 each 12  . insulin aspart (NOVOLOG) 100 UNIT/ML injection Correction coverage: Sensitive (thin, NPO, renal)  CBG < 70: implement hypoglycemia protocol  CBG 70 - 120: 0 units  CBG 121 - 150: 1 unit  CBG 151 - 200: 2 units  CBG 201 -  250: 3 units  CBG 251 - 300: 5 units  CBG 301 - 350: 7 units  CBG 351 - 400 9 units  CBG > 400 call MD and obtain STAT lab verification 10 mL 11  . Insulin Glargine (BASAGLAR KWIKPEN) 100 UNIT/ML SOPN Inject 0.25 mLs (25 Units total) into the skin at bedtime. 3 pen 11  . Insulin Pen Needle (PEN NEEDLES 31GX5/16") 31G X 8 MM MISC Use to inject insulin 5 times a day. 100 each 11  . isosorbide mononitrate (IMDUR) 30 MG 24 hr tablet Take 1 tablet (30 mg total) by mouth daily. 30 tablet 12  . Lancets (ACCU-CHEK MULTICLIX) lancets Use up to 5 times a day to check blood sugar. 100 each 5  . metFORMIN (GLUCOPHAGE) 500 MG tablet Take 1 tablet (500 mg total) by mouth 2 (two) times daily with a meal. 60 tablet 1  . nebivolol 20 MG TABS Take 1 tablet (20 mg total) by mouth daily. 30 tablet 1  . omeprazole (PRILOSEC) 20 MG capsule TAKE ONE CAPSULE TWICE A DAY BEFORE A MEAL 180 capsule 0  . thiamine 100 MG tablet Take 1 tablet (100 mg total) by mouth daily. 30 tablet 1  . vitamin B-12 (CYANOCOBALAMIN) 1000 MCG tablet Take 2 tablets (2,000 mcg total) by mouth daily. 60 tablet 1   No current facility-administered medications on file prior to visit.     BP (!) 146/64 (BP Location: Left Arm, Patient Position: Sitting, Cuff Size: Normal)   Pulse 65   Temp  98 F (36.7 C) (Oral)   Wt 218 lb 14.4 oz (99.3 kg)   BMI 28.11 kg/m       Objective:   Physical Exam  Constitutional: He is oriented to person, place, and time. Vital signs are normal. He appears well-developed and well-nourished. He appears ill. No distress.  HENT:  Head: Normocephalic and atraumatic.  Right Ear: External ear normal.  Left Ear: External ear normal.  Nose: Nose normal.  Mouth/Throat: Oropharynx is clear and moist. No oropharyngeal exudate.  Eyes: Conjunctivae and EOM are normal. Pupils are equal, round, and reactive to light. Right eye exhibits no discharge. Left eye exhibits no discharge. No scleral icterus.  Cardiovascular: Normal rate, normal heart sounds and intact distal pulses.  An irregularly irregular rhythm present. Exam reveals no gallop and no friction rub.   No murmur heard. Pulmonary/Chest: Effort normal and breath sounds normal. No respiratory distress. He has no wheezes. He has no rales. He exhibits no tenderness.  Abdominal: Soft. Bowel sounds are normal. He exhibits no distension and no mass. There is no tenderness. There is no rebound and no guarding.  Musculoskeletal:  Walks with a rolling walker  Neurological: He is alert and oriented to person, place, and time.  Skin: Skin is warm and dry. No rash noted. He is not diaphoretic. No erythema. No pallor.  Psychiatric: He has a normal mood and affect. His behavior is normal. Judgment and thought content normal.  Nursing note and vitals reviewed.     Assessment & Plan:  1. Acute CVA (cerebrovascular accident) (HCC) - Follow up with Neurology as directed  - Basic metabolic panel - CBC with Differential/Platelet - Continue with PT and use rolling walker at all times.  - Fall precautions reviewed with patient.  - Reviewed imaging and labs from hospital visit   2. Fatigue, unspecified type - partialyl due to insomnia, PT, and post hospital discharge as well as current disease status  - Basic  metabolic panel - CBC with Differential/Platelet - POC Glucose (CBG)  3. BPH associated with nocturia - PSA - Urinalysis with Reflex Microscopic  4. Moderate episode of recurrent major depressive disorder (HCC) - Will d/c Celexa over a 5 week taper.  - mirtazapine (REMERON) 15 MG tablet; Take 1 tablet (15 mg total) by mouth at bedtime.  Dispense: 90 tablet; Refill: 1 - Continue with Wellbutrin - Follow up in 2 weeks   5. Primary insomnia - mirtazapine (REMERON) 15 MG tablet; Take 1 tablet (15 mg total) by mouth at bedtime.  Dispense: 90 tablet; Refill: 1 - Follow up in 2 weeks   Shirline Frees, NP

## 2016-07-17 NOTE — Telephone Encounter (Signed)
Pt's wife stated she would like for her husband to have physical therapy more than twice a week. She stated that he does very well and wants him to get as much physical therapy sessions a week as possible. He is only authorized for once a week.  She is requesting as many visits a week as possible, please advise.

## 2016-07-18 ENCOUNTER — Encounter: Payer: Medicare Other | Admitting: Physical Therapy

## 2016-07-18 ENCOUNTER — Ambulatory Visit: Payer: Medicare Other | Admitting: Adult Health

## 2016-07-18 LAB — BASIC METABOLIC PANEL
BUN: 23 mg/dL (ref 6–23)
CO2: 28 mEq/L (ref 19–32)
Calcium: 9.4 mg/dL (ref 8.4–10.5)
Chloride: 103 mEq/L (ref 96–112)
Creatinine, Ser: 1.14 mg/dL (ref 0.40–1.50)
GFR: 67.1 mL/min (ref >60.00)
Glucose, Bld: 240 mg/dL — ABNORMAL HIGH (ref 70–99)
Potassium: 4.8 mEq/L (ref 3.5–5.1)
Sodium: 141 mEq/L (ref 135–145)

## 2016-07-18 LAB — CBC WITH DIFFERENTIAL/PLATELET
Basophils Absolute: 0.1 10*3/uL (ref 0.0–0.1)
Basophils Relative: 1.1 % (ref 0.0–3.0)
Eosinophils Absolute: 0.7 10*3/uL (ref 0.0–0.7)
Eosinophils Relative: 10.8 % — ABNORMAL HIGH (ref 0.0–5.0)
HCT: 39.2 % (ref 39.0–52.0)
Hemoglobin: 12.7 g/dL — ABNORMAL LOW (ref 13.0–17.0)
Lymphocytes Relative: 24.7 % (ref 12.0–46.0)
Lymphs Abs: 1.6 10*3/uL (ref 0.7–4.0)
MCHC: 32.3 g/dL (ref 30.0–36.0)
MCV: 93.3 fl (ref 78.0–100.0)
Monocytes Absolute: 0.5 10*3/uL (ref 0.1–1.0)
Monocytes Relative: 7.9 % (ref 3.0–12.0)
Neutro Abs: 3.7 10*3/uL (ref 1.4–7.7)
Neutrophils Relative %: 55.5 % (ref 43.0–77.0)
Platelets: 218 10*3/uL (ref 150.0–400.0)
RBC: 4.2 Mil/uL — ABNORMAL LOW (ref 4.22–5.81)
RDW: 18.1 % — ABNORMAL HIGH (ref 11.5–15.5)
WBC: 6.7 10*3/uL (ref 4.0–10.5)

## 2016-07-18 LAB — PSA: PSA: 0.28 ng/mL (ref 0.10–4.00)

## 2016-07-19 ENCOUNTER — Other Ambulatory Visit: Payer: Self-pay

## 2016-07-19 ENCOUNTER — Encounter: Payer: Self-pay | Admitting: Physical Therapy

## 2016-07-19 ENCOUNTER — Telehealth: Payer: Self-pay | Admitting: *Deleted

## 2016-07-19 ENCOUNTER — Ambulatory Visit: Payer: Medicare Other | Attending: Physical Medicine & Rehabilitation | Admitting: Physical Therapy

## 2016-07-19 VITALS — BP 138/71 | HR 56

## 2016-07-19 DIAGNOSIS — R2681 Unsteadiness on feet: Secondary | ICD-10-CM | POA: Insufficient documentation

## 2016-07-19 DIAGNOSIS — I69314 Frontal lobe and executive function deficit following cerebral infarction: Secondary | ICD-10-CM

## 2016-07-19 DIAGNOSIS — M6281 Muscle weakness (generalized): Secondary | ICD-10-CM | POA: Diagnosis not present

## 2016-07-19 NOTE — Progress Notes (Signed)
Was independent on walker prior to hospitalization but was very unsteady in the office. Had difficulty transferring from walker seat to w/c. Very sob when up; [facility] to assist with sleep. Wife states she is overwhelmed and afraid of him falling. Feels he was discharged to soon. States she has no other support in the home. Nursing to evaluated fall risk and SW to evaluate support system and long terms risk for placement or educate on resources as appropriate.

## 2016-07-19 NOTE — Telephone Encounter (Signed)
Do not think increased freq of PT is necessary

## 2016-07-19 NOTE — Telephone Encounter (Signed)
Luis Yu's wife called about getting Luis Yu's rehab visits increased to 3xweek and really wants 4x week.  Please advise.

## 2016-07-19 NOTE — Addendum Note (Signed)
Addended by: Lillyona Polasek, [place] W on: 07/19/2016 03:18 PM   Modules accepted: Orders

## 2016-07-19 NOTE — Therapy (Signed)
[name] [address] [place], [place], [address] Phone: [phone]   Fax:  [phone]  Physical Therapy Treatment  Patient Details  Name: Luis Yu MRN: [id number] Date of Birth: 1945/10/19 Referring Provider: Claudette Laws, MD  Encounter Date: 07/19/2016      PT End of Session - 07/19/16 1343    Visit Number 4   Number of Visits 16   Date for PT Re-Evaluation 09/08/16   PT Start Time 1351  2 units secondary to late start to treatment   PT Stop Time 1431   PT Time Calculation (min) 40 min   Equipment Utilized During Treatment Gait belt   Activity Tolerance Patient tolerated treatment well   Behavior During Therapy [facility] for tasks assessed/performed      Past Medical History:  Diagnosis Date  . Adjustment disorder with mixed emotional features   . Atrial flutter (HCC)   . CAD (coronary artery disease)   . Depression   . Diabetes mellitus type II, uncontrolled (HCC)   . Diabetic peripheral neuropathy (HCC)   . Erectile dysfunction   . GERD (gastroesophageal reflux disease)   . Herpes zoster   . Hyperlipidemia   . Hypertension   . Obesity   . Osteoarthritis   . Postherpetic neuralgia   . Sleep apnea     Past Surgical History:  Procedure Laterality Date  . CARDIOVERSION N/A 11/10/2015   Procedure: CARDIOVERSION;  Surgeon: Laurey Morale, MD;  Location: [facility] ENDOSCOPY;  Service: Cardiovascular;  Laterality: N/A;  . CARDIOVERSION N/A 02/21/2016   Procedure: CARDIOVERSION;  Surgeon: Lewayne Bunting, MD;  Location: [facility] ENDOSCOPY;  Service: Cardiovascular;  Laterality: N/A;  . CORONARY ARTERY BYPASS GRAFT  1996   eith a LIMA to the LAD, saphenous vein graft to the acute marginal, saphenous vein graft to the PDA and saphenous vein graft to the circumflex.  [name] KNEE ARTHROSCOPY  05/2004   right knee  . REPLACEMENT TOTAL KNEE BILATERAL    . SHOULDER SURGERY      Vitals:   07/19/16 1353  BP: 138/71  Pulse: (!) 56   SpO2: 98%        Subjective Assessment - 07/19/16 1342    Subjective Reports he feels great today.   Pertinent History Recent falls.  Peripheral neuropathy. Chronic atrial fib.; bil TKA's.   How long can you walk comfortably? Short distances with a FWW.   Diagnostic tests MRI; CT   Currently in Pain? No/denies            [facility] PT Assessment - 07/19/16 0001      Assessment   Medical Diagnosis Left CVA.   Onset Date/Surgical Date 07/07/16     Precautions   Precautions Fall     Restrictions   Weight Bearing Restrictions No                     [facility] Adult PT Treatment/Exercise - 07/19/16 0001      Knee/Hip Exercises: Aerobic   Nustep Level 5 x 15 minutes UE/LE activity, monitored for progression     Knee/Hip Exercises: Standing   Heel Raises Both;2 sets;10 reps   Hip Flexion AROM;Both;2 sets;10 reps;Knee bent     Knee/Hip Exercises: Seated   Hamstring Curl Strengthening;Both;3 sets;10 reps   Hamstring Limitations Green theraband   Sit to Sand 15 reps;without UE support     Knee/Hip Exercises: Supine   Bridges Limitations 2x10   Straight Leg Raises AROM;Both;2 sets;10 reps  Balance Exercises - 07/19/16 1429      Balance Exercises: Standing   Standing Eyes Opened Narrow base of support (BOS);Foam/compliant surface  x3 min with 1 UE support   Standing, One Foot on a Step Eyes open;Foam/compliant surface;6 inch  on airex x2 min   Rockerboard Anterior/posterior;EO;UE support  x3 min with 1 UE support             PT Short Term Goals - 07/16/16 [id number]      PT SHORT TERM GOAL #1   Title Independent with an initial HEP.   Time 4   Period Weeks   Status Achieved     PT SHORT TERM GOAL #2   Title Increase Berg score to 32/56.   Time 4   Period Weeks   Status On-going           PT Long Term Goals - 07/16/16 0930      PT LONG TERM GOAL #1   Title Independent with an advanced HEP.   Time 8   Period Weeks   Status  On-going     PT LONG TERM GOAL #2   Title Increase bilateral LE strength to a solid 4+/5 to increase stability for functional activites.   Time 8   Period Weeks   Status On-going     PT LONG TERM GOAL #3   Title Walk in clinic with FWW 500 feet with supervision only without resting and 02 not going below 93%.   Time 8   Period Weeks   Status On-going     PT LONG TERM GOAL #4   Title Perform a reciprocating stair gait with one railing.   Time 8   Period Weeks   Status On-going     PT LONG TERM GOAL #5   Title Increase Berg score to 44-45/56.   Time 8   Period Weeks   Status On-going               Plan - 07/19/16 1436    Clinical Impression Statement Patient tolerated today's treatment well with only reports of fatigue noted with exercise. Prolonged time on NuStep and increased reps utilized today in efforts to increase endurance and strength. Ataxic motions noted today with seated HS curl as well as bridges. Deficits with balance noted today on uneven surface and with one UE support. Patient requested a seated rest break and sat in his rollator when leaving clinic. Vitals taken following NuStep 150/76, 76 bpm, 96%. Vitals taken following end of treatment were 136/82, 98%, 71 bpm.   Clinical Impairments Affecting Rehab Potential Poor balance.  H/o CVA.   PT Frequency 2x / week   PT Duration 8 weeks   PT Treatment/Interventions ADLs/Self Care Home Management;Functional mobility training;Stair training;Gait training;Therapeutic activities;Therapeutic exercise;Balance training;Neuromuscular re-education;Patient/family education   PT Next Visit Plan Please monitor BP; HR and 02 sat.  Bilateral LE strengthening to include core exercises.  Gait and balance activites.   Consulted and Agree with Plan of Care Patient      Patient will benefit from skilled therapeutic intervention in order to improve the following deficits and impairments:  Abnormal gait, Decreased activity  tolerance, Decreased balance, Decreased mobility, Decreased coordination, Decreased strength, Difficulty walking  Visit Diagnosis: Unsteadiness on feet  Muscle weakness (generalized)     Problem List Patient Active Problem List   Diagnosis Date Noted  . Frontal lobe and executive function deficit following cerebral infarction 07/02/2016  . Neurologic gait disorder   . Coronary  artery disease involving coronary bypass graft of native heart without angina pectoris   . Benign essential HTN   . Acute kidney injury (HCC)   . Nausea & vomiting 06/28/2016  . Ischemic stroke of frontal lobe (HCC) 06/28/2016  . Thrombocytopenia (HCC) 06/28/2016  . Renal failure (ARF), acute on chronic (HCC) 06/28/2016  . Stroke (cerebrum) (HCC) 06/28/2016  . Acute CVA (cerebrovascular accident) (HCC) 06/28/2016  . Insomnia 02/02/2016  . Atrial fibrillation (HCC) 10/27/2015  . Uncoordinated movements 02/24/2014  . Unspecified hereditary and idiopathic peripheral neuropathy 02/24/2013  . Low back pain 02/24/2013  . Abnormality of gait 02/09/2013  . Memory loss, short term 12/05/2011  . LUMBAR STRAIN, ACUTE 05/05/2010  . OSA (obstructive sleep apnea) 09/14/2009  . BPH (benign prostatic hyperplasia) 07/27/2009  . CARDIOVASCULAR STUDIES, ABNORMAL 07/27/2009  . CHEST PAIN 06/08/2009  . OSTEOARTHRITIS 05/30/2009  . HEEL PAIN, LEFT 05/24/2009  . Dyspnea on exertion 05/24/2009  . ADJUSTMENT DISORDER WITH MIXED FEATURES 08/25/2008  . Diabetes type 2, uncontrolled (HCC) 02/24/2008  . ERECTILE DYSFUNCTION 02/10/2007  . Hyperlipidemia 10/09/2006  . OBESITY 10/09/2006  . Essential hypertension 10/09/2006  . Coronary atherosclerosis 10/09/2006  . GERD 10/09/2006  . DM (diabetes mellitus) type II uncontrolled with retinopathy 06/10/2006    Evelene Croon, PTA 07/19/2016, 2:41 PM  [name] [address] [place], [place], [address] Phone: [phone]   Fax:   [phone]  Name: Luis Yu MRN: [id number] Date of Birth: [birth year]

## 2016-07-20 ENCOUNTER — Other Ambulatory Visit: Payer: Self-pay | Admitting: *Deleted

## 2016-07-20 NOTE — Telephone Encounter (Signed)
Notified. 

## 2016-07-20 NOTE — Patient Outreach (Signed)
[facility]) Care Management  07/20/2016  Luis ImusDonald W Yu 09/27/1945 [id number]   CSW made an initial attempt to try and contact patient today to perform phone assessment, as well as assess and assist with social needs and services, without success. A HIPPA compliant message was left for patient on voicemail (ph#: [phone]). CSW is currently awaiting a return call. CSW will make a second outreach attempt within the next week, if CSW does not receive a return call from patient in the meantime.    [name], LCSW [facility]   Clinical Social Worker cell #: [phone]

## 2016-07-23 ENCOUNTER — Other Ambulatory Visit: Payer: Self-pay | Admitting: *Deleted

## 2016-07-23 ENCOUNTER — Encounter: Payer: Self-pay | Admitting: Physical Therapy

## 2016-07-23 ENCOUNTER — Ambulatory Visit: Payer: Medicare Other | Admitting: Physical Therapy

## 2016-07-23 DIAGNOSIS — M6281 Muscle weakness (generalized): Secondary | ICD-10-CM | POA: Diagnosis not present

## 2016-07-23 DIAGNOSIS — R2681 Unsteadiness on feet: Secondary | ICD-10-CM

## 2016-07-23 DIAGNOSIS — I69314 Frontal lobe and executive function deficit following cerebral infarction: Secondary | ICD-10-CM | POA: Diagnosis not present

## 2016-07-23 NOTE — Patient Outreach (Signed)
[facility]) Care Management  07/23/2016  Doretha ImusDonald W Muralles 01-16-1946 [id number]   CSW made a second attempt to try and contact patient today to perform phone assessment, as well as assess and assist with social needs and services, without success. A HIPPA compliant message was left for patient on voicemail (ph#: [phone], no option to leave voicemail on home phone - [phone]). CSW is currently awaiting a return call. CSW will make a third outreach attempt within the next week, if CSW does not receive a return call from patient in the meantime.    Lincoln MaxinKelly Amparo Donalson, LCSW [facility]  Clinical Social Worker cell #: [phone]

## 2016-07-23 NOTE — Therapy (Signed)
[name] [address] [place], [place], [address] Phone: [phone]   Fax:  [phone]  Physical Therapy Treatment  Patient Details  Name: Luis [name] MRN: [id number] Date of Birth: October 30, 1945 Referring Provider: Claudette LawsAndrew Kirsteins, MD  Encounter Date: 07/23/2016      PT End of Session - 07/23/16 1433    Visit Number 5   Number of Visits 16   Date for PT Re-Evaluation 09/08/16   PT Start Time 1353   PT Stop Time 1436   PT Time Calculation (min) 43 min   Equipment Utilized During Treatment Gait belt   Activity Tolerance Patient tolerated treatment well   Behavior During Therapy [facility] for tasks assessed/performed      Past Medical History:  Diagnosis Date  . Adjustment disorder with mixed emotional features   . Atrial flutter (HCC)   . CAD (coronary artery disease)   . Depression   . Diabetes mellitus type II, uncontrolled (HCC)   . Diabetic peripheral neuropathy (HCC)   . Erectile dysfunction   . GERD (gastroesophageal reflux disease)   . Herpes zoster   . Hyperlipidemia   . Hypertension   . Obesity   . Osteoarthritis   . Postherpetic neuralgia   . Sleep apnea     Past Surgical History:  Procedure Laterality Date  . CARDIOVERSION N/A 11/10/2015   Procedure: CARDIOVERSION;  Surgeon: Laurey Moralealton S McLean, MD;  [name];  Service: Cardiovascular;  Laterality: N/A;  . CARDIOVERSION N/A 02/21/2016   Procedure: CARDIOVERSION;  Surgeon: Lewayne BuntingBrian S Crenshaw, MD;  [facility];  Service: Cardiovascular;  Laterality: N/A;  . CORONARY ARTERY BYPASS GRAFT  1996   eith a LIMA to the LAD, saphenous vein graft to the acute marginal, saphenous vein graft to the PDA and saphenous vein graft to the circumflex.  [name]. KNEE ARTHROSCOPY  05/2004   right knee  . REPLACEMENT TOTAL KNEE BILATERAL    . SHOULDER SURGERY      There were no vitals filed for this visit.      Subjective Assessment - 07/23/16 1355    Subjective  Patientreported feeling good today, no complaints after last treatment and no recent falls.   Pertinent History Recent falls.  Peripheral neuropathy. Chronic atrial fib.; bil TKA's.   How long can you walk comfortably? Short distances with a FWW.   Diagnostic tests MRI; CT   Currently in Pain? No/denies            [name] PT Assessment - 07/23/16 0001      Berg Balance Test   Sit to Stand Able to stand  independently using hands   Standing Unsupported Able to stand 30 seconds unsupported   Sitting with Back Unsupported but Feet Supported on Floor or Stool Able to sit safely and securely 2 minutes   Stand to Sit Sits safely with minimal use of hands   Transfers Able to transfer safely, definite need of hands   Standing Unsupported with Eyes Closed Able to stand 3 seconds   Standing Ubsupported with Feet Together Needs help to attain position but able to stand for 30 seconds with feet together   From Standing, Reach Forward with Outstretched Arm Reaches forward but needs supervision   From Standing Position, Pick up Object from Floor Unable to pick up and needs supervision   From Standing Position, Turn to Look Behind Over each Shoulder Turn sideways only but maintains balance   Turn 360 Degrees Able to turn 360 degrees  safely but slowly   Standing Unsupported, Alternately Place Feet on Step/Stool Able to complete >2 steps/needs minimal assist   Standing Unsupported, One Foot in Front Needs help to step but can hold 15 seconds   Standing on One Leg Unable to try or needs assist to prevent fall   Total Score 27                     [facility] Adult PT Treatment/Exercise - 07/23/16 0001      Knee/Hip Exercises: Aerobic   Nustep Level 5 x 15 minutes UE/LE activity, monitored for progression     Knee/Hip Exercises: Seated   Long Arc Quad Strengthening;Both;Weights;3 sets;10 reps   Long Arc Quad Weight 4 lbs.   Long [employer] Limitations cues for technique required and count    Hamstring Curl Strengthening;Both;3 sets;10 reps   Hamstring Limitations Green theraband             Balance Exercises - 07/23/16 1417      Balance Exercises: Standing   Standing Eyes Opened Narrow base of support (BOS);Wide (BOA);Foam/compliant surface;Solid surface  x several reps   Standing Eyes Closed Wide (BOA);Solid surface;2 reps   Tandem Stance Eyes open;Intermittent upper extremity support;4 reps   Standing, One Foot on a Step Eyes open;6 inch;4 reps   Step Ups Forward;6 inch  2x10   Sit to Stand Time 2x5 with UE support             PT Short Term Goals - 07/23/16 1434      PT SHORT TERM GOAL #1   Title Independent with an initial HEP.   Time 4   Period Weeks   Status Achieved     PT SHORT TERM GOAL #2   Title Increase Berg score to 32/56.   Time 4   Period Weeks   Status On-going  27/56 07/23/16           PT Long Term Goals - 07/16/16 0930      PT LONG TERM GOAL #1   Title Independent with an advanced HEP.   Time 8   Period Weeks   Status On-going     PT LONG TERM GOAL #2   Title Increase bilateral LE strength to a solid 4+/5 to increase stability for functional activites.   Time 8   Period Weeks   Status On-going     PT LONG TERM GOAL #3   Title Walk in clinic with FWW 500 feet with supervision only without resting and 02 not going below 93%.   Time 8   Period Weeks   Status On-going     PT LONG TERM GOAL #4   Title Perform a reciprocating stair gait with one railing.   Time 8   Period Weeks   Status On-going     PT LONG TERM GOAL #5   Title Increase Berg score to 44-45/56.   Time 8   Period Weeks   Status On-going               Plan - 07/23/16 1436    Clinical Impression Statement Patient tolerated treatment well today. Patient has minimal LOB and able to recover with assist. CGA throughout with balance exercises for safety. Patient improved with balance and BERG score to 27/56 today. Patient goals progressing yet  ongoing due to strength and balance deficts. vitals WNL throughout today. after sitting exercise O2 94% HR 63 BP 135/76, after standig exercise O2 96% HR  75 BP 137/84, and post treatment O2 96% HR 70 BP 136/73   Clinical Impairments Affecting Rehab Potential Poor balance.  H/o CVA.   PT Frequency 2x / week   PT Duration 8 weeks   PT Treatment/Interventions ADLs/Self Care Home Management;Functional mobility training;Stair training;Gait training;Therapeutic activities;Therapeutic exercise;Balance training;Neuromuscular re-education;Patient/family education   PT Next Visit Plan Please monitor BP; HR and 02 sat.  Bilateral LE strengthening to include core exercises.  Gait and balance activites.   Consulted and Agree with Plan of Care Patient      Patient will benefit from skilled therapeutic intervention in order to improve the following deficits and impairments:  Abnormal gait, Decreased activity tolerance, Decreased balance, Decreased mobility, Decreased coordination, Decreased strength, Difficulty walking  Visit Diagnosis: Unsteadiness on feet  Muscle weakness (generalized)     Problem List Patient Active Problem List   Diagnosis Date Noted  . Frontal lobe and executive function deficit following cerebral infarction 07/02/2016  . Neurologic gait disorder   . Coronary artery disease involving coronary bypass graft of native heart without angina pectoris   . Benign essential HTN   . Acute kidney injury (HCC)   . Nausea & vomiting 06/28/2016  . Ischemic stroke of frontal lobe (HCC) 06/28/2016  . Thrombocytopenia (HCC) 06/28/2016  . Renal failure (ARF), acute on chronic (HCC) 06/28/2016  . Stroke (cerebrum) (HCC) 06/28/2016  . Acute CVA (cerebrovascular accident) (HCC) 06/28/2016  . Insomnia 02/02/2016  . Atrial fibrillation (HCC) 10/27/2015  . Uncoordinated movements 02/24/2014  . Unspecified hereditary and idiopathic peripheral neuropathy 02/24/2013  . Low back pain 02/24/2013  .  Abnormality of gait 02/09/2013  . Memory loss, short term 12/05/2011  . LUMBAR STRAIN, ACUTE 05/05/2010  . OSA (obstructive sleep apnea) 09/14/2009  . BPH (benign prostatic hyperplasia) 07/27/2009  . CARDIOVASCULAR STUDIES, ABNORMAL 07/27/2009  . CHEST PAIN 06/08/2009  . OSTEOARTHRITIS 05/30/2009  . HEEL PAIN, LEFT 05/24/2009  . Dyspnea on exertion 05/24/2009  . ADJUSTMENT DISORDER WITH MIXED FEATURES 08/25/2008  . Diabetes type 2, uncontrolled (HCC) 02/24/2008  . ERECTILE DYSFUNCTION 02/10/2007  . Hyperlipidemia 10/09/2006  . OBESITY 10/09/2006  . Essential hypertension 10/09/2006  . Coronary atherosclerosis 10/09/2006  . GERD 10/09/2006  . DM (diabetes mellitus) type II uncontrolled with retinopathy 06/10/2006    Luis [name], Luis [name] 07/23/2016, 2:43 PM  Luis [name] [address] [place], [place], [address] Phone: [phone]   Fax:  [phone]  Name: Luis [name] MRN: [id number] Date of Birth: October 05, 1945

## 2016-07-23 NOTE — Patient Outreach (Signed)
Referral received from [place] MD office for post CVA, unsteady gait, telephone call to patient for screening, no answer to home or cell phone and no option to leave voicemail.  PLAN Outreach pt again this week  Irving ShowsJulie Farmer [facility], BSN [facility] Care Coordinator [phone]

## 2016-07-24 ENCOUNTER — Ambulatory Visit: Payer: Medicare Other | Admitting: Podiatry

## 2016-07-24 ENCOUNTER — Ambulatory Visit: Payer: Medicare Other | Admitting: Adult Health

## 2016-07-24 ENCOUNTER — Ambulatory Visit (INDEPENDENT_AMBULATORY_CARE_PROVIDER_SITE_OTHER): Payer: Medicare Other | Admitting: Cardiology

## 2016-07-24 ENCOUNTER — Encounter: Payer: Self-pay | Admitting: Cardiology

## 2016-07-24 ENCOUNTER — Other Ambulatory Visit: Payer: Medicare Other

## 2016-07-24 VITALS — BP 124/74 | HR 63 | Ht 74.0 in | Wt 222.0 lb

## 2016-07-24 DIAGNOSIS — E7800 Pure hypercholesterolemia, unspecified: Secondary | ICD-10-CM | POA: Diagnosis not present

## 2016-07-24 DIAGNOSIS — I2510 Atherosclerotic heart disease of native coronary artery without angina pectoris: Secondary | ICD-10-CM

## 2016-07-24 DIAGNOSIS — I4892 Unspecified atrial flutter: Secondary | ICD-10-CM | POA: Diagnosis not present

## 2016-07-24 DIAGNOSIS — I639 Cerebral infarction, unspecified: Secondary | ICD-10-CM | POA: Diagnosis not present

## 2016-07-24 DIAGNOSIS — I10 Essential (primary) hypertension: Secondary | ICD-10-CM

## 2016-07-24 NOTE — Patient Instructions (Signed)
Your physician wants you to follow-up in: 6 MONTHS WITH DR CRENSHAW You will receive a reminder letter in the mail two months in advance. If you don't receive a letter, please call our office to schedule the follow-up appointment.   If you need a refill on your cardiac medications before your next appointment, please call your pharmacy.  

## 2016-07-25 ENCOUNTER — Ambulatory Visit (INDEPENDENT_AMBULATORY_CARE_PROVIDER_SITE_OTHER): Payer: Medicare Other | Admitting: Podiatry

## 2016-07-25 ENCOUNTER — Encounter: Payer: Medicare Other | Admitting: Physical Therapy

## 2016-07-25 ENCOUNTER — Encounter: Payer: Self-pay | Admitting: Podiatry

## 2016-07-25 VITALS — BP 106/78 | HR 73

## 2016-07-25 DIAGNOSIS — B351 Tinea unguium: Secondary | ICD-10-CM

## 2016-07-25 DIAGNOSIS — Z794 Long term (current) use of insulin: Secondary | ICD-10-CM

## 2016-07-25 DIAGNOSIS — M79609 Pain in unspecified limb: Secondary | ICD-10-CM | POA: Diagnosis not present

## 2016-07-25 DIAGNOSIS — S92421A Displaced fracture of distal phalanx of right great toe, initial encounter for closed fracture: Secondary | ICD-10-CM

## 2016-07-25 DIAGNOSIS — E1140 Type 2 diabetes mellitus with diabetic neuropathy, unspecified: Secondary | ICD-10-CM

## 2016-07-25 NOTE — Progress Notes (Signed)
Patient ID: Luis ImusDonald W Yu, male   DOB: [birth year], 71 y.o.   MRN: [id number] Complaint:  Visit Type: Patient returns to my office for continued preventative foot care services. Complaint: Patient states" my nails have grown long and thick and become painful to walk and wear shoes" Patient has been diagnosed with DM with neuropathy. Patient has history of fracture right hallux.  He was  Fitted for braces and he was told to be evaluated by myself for possible infection.  Podiatric Exam: Vascular: dorsalis pedis and posterior tibial pulses are palpable bilateral. Capillary return is immediate. Temperature gradient is WNL. Skin turgor WNL  Sensorium: Normal Semmes Weinstein monofilament test. Normal tactile sensation bilaterally. Nail Exam: Pt has thick disfigured discolored nails with subungual debris noted bilateral entire nail hallux through fifth toenails Ulcer Exam: There is no evidence of ulcer or pre-ulcerative changes or infection. Orthopedic Exam: Muscle tone and strength are WNL. No limitations in general ROM. No crepitus or effusions noted. HAV 1st MPJ  B/L.  S/P fracture great toe right foot. Skin: No Porokeratosis. No infection or ulcers.  Right hallux was normal color with no increased temperature.   Diagnosis:  Onychomycosis, , Pain in right toe, pain in left toes  S/P hallux fracture.  Treatment & Plan Procedures and Treatment: Consent by patient was obtained for treatment procedures. The patient understood the discussion of treatment and procedures well. All questions were answered thoroughly reviewed. Debridement of mycotic and hypertrophic toenails, 1 through 5 bilateral and clearing of subungual debris. No ulceration, no infection noted.  He was evaluated for an infection and there was no evidence of infection findings. Return Visit-Office Procedure: Patient instructed to return to the office for a follow up visit 3 months for continued evaluation and treatment.    Luis Yu  DPM

## 2016-07-26 ENCOUNTER — Ambulatory Visit: Payer: Medicare Other | Admitting: Physical Therapy

## 2016-07-26 ENCOUNTER — Other Ambulatory Visit: Payer: Self-pay | Admitting: *Deleted

## 2016-07-26 ENCOUNTER — Encounter: Payer: Self-pay | Admitting: *Deleted

## 2016-07-26 DIAGNOSIS — R2681 Unsteadiness on feet: Secondary | ICD-10-CM | POA: Diagnosis not present

## 2016-07-26 DIAGNOSIS — M6281 Muscle weakness (generalized): Secondary | ICD-10-CM | POA: Diagnosis not present

## 2016-07-26 DIAGNOSIS — I69314 Frontal lobe and executive function deficit following cerebral infarction: Secondary | ICD-10-CM | POA: Diagnosis not present

## 2016-07-26 NOTE — Therapy (Signed)
[name] [address] [place], [place], [address] Phone: [phone]   Fax:  [phone]  Physical Therapy Treatment  Patient Details  Name: Luis Yu MRN: [id number] Date of Birth: 1945-04-13 Referring Provider: Claudette Laws, MD  Encounter Date: 07/26/2016      PT End of Session - 07/26/16 1619    Equipment Utilized During Treatment Gait belt   Behavior During Therapy [facility] for tasks assessed/performed      Past Medical History:  Diagnosis Date  . Adjustment disorder with mixed emotional features   . Atrial flutter (HCC)   . CAD (coronary artery disease)   . Depression   . Diabetes mellitus type II, uncontrolled (HCC)   . Diabetic peripheral neuropathy (HCC)   . Erectile dysfunction   . GERD (gastroesophageal reflux disease)   . Herpes zoster   . Hyperlipidemia   . Hypertension   . Obesity   . Osteoarthritis   . Postherpetic neuralgia   . Sleep apnea     Past Surgical History:  Procedure Laterality Date  . CARDIOVERSION N/A 11/10/2015   Procedure: CARDIOVERSION;  Surgeon: Laurey Morale, MD;  Location: [facility] ENDOSCOPY;  Service: Cardiovascular;  Laterality: N/A;  . CARDIOVERSION N/A 02/21/2016   Procedure: CARDIOVERSION;  Surgeon: Lewayne Bunting, MD;  Location: [facility] ENDOSCOPY;  Service: Cardiovascular;  Laterality: N/A;  . CORONARY ARTERY BYPASS GRAFT  1996   eith a LIMA to the LAD, saphenous vein graft to the acute marginal, saphenous vein graft to the PDA and saphenous vein graft to the circumflex.  [name] KNEE ARTHROSCOPY  05/2004   right knee  . REPLACEMENT TOTAL KNEE BILATERAL    . SHOULDER SURGERY      There were no vitals filed for this visit.      Subjective Assessment - 07/26/16 1543    Subjective Doing good today.   Pain Score 6    Pain Location Generalized                         [facility] Adult PT Treatment/Exercise - 07/26/16 0001      Neuro Re-ed    Neuro Re-ed Details  In parallel  bars for Rockerboard x 3 minutes. f/b Orange XTS resisted walking 2 minutes forward and 2 minutes backward.     Exercises   Exercises Knee/Hip     Knee/Hip Exercises: Aerobic   Nustep Level 4 x 15 minutes.     Knee/Hip Exercises: Machines for Strengthening   Cybex Knee Extension 10# x 3 minutes.   Cybex Knee Flexion 40# x 3 minutes.                  PT Short Term Goals - 07/23/16 1434      PT SHORT TERM GOAL #1   Title Independent with an initial HEP.   Time 4   Period Weeks   Status Achieved     PT SHORT TERM GOAL #2   Title Increase Berg score to 32/56.   Time 4   Period Weeks   Status On-going  27/56 07/23/16           PT Long Term Goals - 07/16/16 0930      PT LONG TERM GOAL #1   Title Independent with an advanced HEP.   Time 8   Period Weeks   Status On-going     PT LONG TERM GOAL #2   Title Increase bilateral LE strength to a solid  4+/5 to increase stability for functional activites.   Time 8   Period Weeks   Status On-going     PT LONG TERM GOAL #3   Title Walk in clinic with FWW 500 feet with supervision only without resting and 02 not going below 93%.   Time 8   Period Weeks   Status On-going     PT LONG TERM GOAL #4   Title Perform a reciprocating stair gait with one railing.   Time 8   Period Weeks   Status On-going     PT LONG TERM GOAL #5   Title Increase Berg score to 44-45/56.   Time 8   Period Weeks   Status On-going               Plan - 07/26/16 1620    Clinical Impression Statement Patient did well today but was limited by fatigue.  Required gait belt as he is very unsteady on his feet.      Patient will benefit from skilled therapeutic intervention in order to improve the following deficits and impairments:     Visit Diagnosis: Unsteadiness on feet  Muscle weakness (generalized)     Problem List Patient Active Problem List   Diagnosis Date Noted  . Frontal lobe and executive function deficit  following cerebral infarction 07/02/2016  . Neurologic gait disorder   . Coronary artery disease involving coronary bypass graft of native heart without angina pectoris   . Benign essential HTN   . Acute kidney injury (HCC)   . Nausea & vomiting 06/28/2016  . Ischemic stroke of frontal lobe (HCC) 06/28/2016  . Thrombocytopenia (HCC) 06/28/2016  . Renal failure (ARF), acute on chronic (HCC) 06/28/2016  . Stroke (cerebrum) (HCC) 06/28/2016  . Acute CVA (cerebrovascular accident) (HCC) 06/28/2016  . Insomnia 02/02/2016  . Atrial fibrillation (HCC) 10/27/2015  . Uncoordinated movements 02/24/2014  . Unspecified hereditary and idiopathic peripheral neuropathy 02/24/2013  . Low back pain 02/24/2013  . Abnormality of gait 02/09/2013  . Memory loss, short term 12/05/2011  . LUMBAR STRAIN, ACUTE 05/05/2010  . OSA (obstructive sleep apnea) 09/14/2009  . BPH (benign prostatic hyperplasia) 07/27/2009  . CARDIOVASCULAR STUDIES, ABNORMAL 07/27/2009  . CHEST PAIN 06/08/2009  . OSTEOARTHRITIS 05/30/2009  . HEEL PAIN, LEFT 05/24/2009  . Dyspnea on exertion 05/24/2009  . ADJUSTMENT DISORDER WITH MIXED FEATURES 08/25/2008  . Diabetes type 2, uncontrolled (HCC) 02/24/2008  . ERECTILE DYSFUNCTION 02/10/2007  . Hyperlipidemia 10/09/2006  . OBESITY 10/09/2006  . Essential hypertension 10/09/2006  . Coronary atherosclerosis 10/09/2006  . GERD 10/09/2006  . DM (diabetes mellitus) type II uncontrolled with retinopathy 06/10/2006    Daisean Brodhead, [place] MPT 07/26/2016, 4:21 PM  [name] [address] [place], [place], [address] Phone: [phone]   Fax:  [phone]  Name: Luis Yu MRN: [id number] Date of Birth: 1945-06-01

## 2016-07-26 NOTE — Patient Outreach (Signed)
Telephone call/ 2nd attempt for screening, no answer to telephone and no option to leave voicemail at [phone], called [phone] and no answer, left voicemail requesting return phone call.  Irving ShowsJulie Annabel Gibeau [facility] Center[facility]NC, BSN [facility] Community Care Coordinator [phone]

## 2016-07-27 ENCOUNTER — Encounter: Payer: Self-pay | Admitting: Physical Therapy

## 2016-07-27 ENCOUNTER — Telehealth: Payer: Self-pay | Admitting: Adult Health

## 2016-07-27 ENCOUNTER — Ambulatory Visit: Payer: Medicare Other | Admitting: Physical Therapy

## 2016-07-27 ENCOUNTER — Other Ambulatory Visit: Payer: Self-pay | Admitting: *Deleted

## 2016-07-27 VITALS — BP 131/74 | HR 63

## 2016-07-27 DIAGNOSIS — R2681 Unsteadiness on feet: Secondary | ICD-10-CM | POA: Diagnosis not present

## 2016-07-27 DIAGNOSIS — M6281 Muscle weakness (generalized): Secondary | ICD-10-CM | POA: Diagnosis not present

## 2016-07-27 DIAGNOSIS — I69314 Frontal lobe and executive function deficit following cerebral infarction: Secondary | ICD-10-CM | POA: Diagnosis not present

## 2016-07-27 NOTE — Telephone Encounter (Signed)
Will route to [name] as FYI. 

## 2016-07-27 NOTE — Telephone Encounter (Signed)
Pts wife calling stating that you all should be on the look out for a form from [facility] and it needs to be signed by a doctor (MD).

## 2016-07-27 NOTE — Patient Outreach (Signed)
[facility] Care Management  07/27/2016  Luis Yu Jun 16, 1945 [id number]   CSW spoke with patient's wife, Luis Yu via phone (ph#: [phone]) to arrange a time for CSW to come out for an initial home visit - scheduled for next Thursday 5/17 at 1:00pm. CSW made RNCM, Raynelle FanningJulie aware as multiple messages were left for patient/wife to call back with no success.   CSW will follow-up 5/17 with full assessment & plan.    Luis MaxinKelly Serenitie Vinton, LCSW [facility]  Clinical Social Worker cell #: [phone]

## 2016-07-27 NOTE — Therapy (Signed)
[name] [address] [place], [place], [address] Phone: [phone]   Fax:  [phone]  Physical Therapy Treatment  Patient Details  Name: Luis Yu MRN: [id number] Date of Birth: 21-Nov-1945 Referring Provider: Claudette LawsAndrew Kirsteins, MD  Encounter Date: 07/27/2016      PT End of Session - 07/27/16 1131    Visit Number 7   Number of Visits 16   Date for PT Re-Evaluation 09/08/16   PT Start Time 1117  2 units secondary to rest break   PT Stop Time 1157   PT Time Calculation (min) 40 min   Equipment Utilized During Treatment Gait belt;Other (comment)  Rollator   Activity Tolerance Patient tolerated treatment well   Behavior During Therapy WFL for tasks assessed/performed      Past Medical History:  Diagnosis Date  . Adjustment disorder with mixed emotional features   . Atrial flutter (HCC)   . CAD (coronary artery disease)   . Depression   . Diabetes mellitus type II, uncontrolled (HCC)   . Diabetic peripheral neuropathy (HCC)   . Erectile dysfunction   . GERD (gastroesophageal reflux disease)   . Herpes zoster   . Hyperlipidemia   . Hypertension   . Obesity   . Osteoarthritis   . Postherpetic neuralgia   . Sleep apnea     Past Surgical History:  Procedure Laterality Date  . CARDIOVERSION N/A 11/10/2015   Procedure: CARDIOVERSION;  Surgeon: Laurey Moralealton S McLean, MD;  [facility];  Service: Cardiovascular;  Laterality: N/A;  . CARDIOVERSION N/A 02/21/2016   Procedure: CARDIOVERSION;  Surgeon: Lewayne BuntingBrian S Crenshaw, MD;  [facility];  Service: Cardiovascular;  Laterality: N/A;  . CORONARY ARTERY BYPASS GRAFT  1996   eith a LIMA to the LAD, saphenous vein graft to the acute marginal, saphenous vein graft to the PDA and saphenous vein graft to the circumflex.  [name]. KNEE ARTHROSCOPY  05/2004   right knee  . REPLACEMENT TOTAL KNEE BILATERAL    . SHOULDER SURGERY      Vitals:   07/27/16 1130  BP: 131/74   Pulse: 63  SpO2: 98%        Subjective Assessment - 07/27/16 1126    Subjective Wife reports that patient is wanting to use stairs instead of ramps and that patient tells her that the walker is making him weaker. Reports that she says patient does not listen.   Patient is accompained by: Family member  Wife   Pertinent History Recent falls.  Peripheral neuropathy. Chronic atrial fib.; bil TKA's.   How long can you walk comfortably? Short distances with a FWW.   Diagnostic tests MRI; CT   Currently in Pain? No/denies            [facility] - 07/27/16 0001      Assessment   Medical Diagnosis Left CVA.   Onset Date/Surgical Date 07/07/16     Precautions   Precautions Fall     Restrictions   Weight Bearing Restrictions No                     [facility] Adult PT Treatment/Exercise - 07/27/16 0001      Knee/Hip Exercises: Aerobic   Nustep Level 4 x 15 minutes.     Knee/Hip Exercises: Machines for Strengthening   Cybex Knee Extension 10# 3x10 reps   Cybex Knee Flexion 40# 3x10 reps     Knee/Hip Exercises: Standing   Heel Raises Both;2 sets;10 reps  Heel Raises Limitations B toe raise x20 reps   Hip Flexion AROM;Both;2 sets;10 reps;Knee bent   Other Standing Knee Exercises Sidestep in // bars x 10 RT     Knee/Hip Exercises: Seated   Sit to Sand 15 reps;without UE support             Balance Exercises - 07/27/16 1208      Balance Exercises: Standing   Standing Eyes Opened Foam/compliant surface  1 UE support x2 min   Tandem Stance Eyes open;Upper extremity support 1;Foam/compliant surface  x2 min   Other Standing Exercises DLS on horizontal balance beam 1 UE support x3 min             PT Short Term Goals - 07/23/16 1434      PT SHORT TERM GOAL #1   Title Independent with an initial HEP.   Time 4   Period Weeks   Status Achieved     PT SHORT TERM GOAL #2   Title Increase Berg score to 32/56.   Time 4   Period Weeks    Status On-going  27/56 07/23/16           PT Long Term Goals - 07/16/16 0930      PT LONG TERM GOAL #1   Title Independent with an advanced HEP.   Time 8   Period Weeks   Status On-going     PT LONG TERM GOAL #2   Title Increase bilateral LE strength to a solid 4+/5 to increase stability for functional activites.   Time 8   Period Weeks   Status On-going     PT LONG TERM GOAL #3   Title Walk in clinic with FWW 500 feet with supervision only without resting and 02 not going below 93%.   Time 8   Period Weeks   Status On-going     PT LONG TERM GOAL #4   Title Perform a reciprocating stair gait with one railing.   Time 8   Period Weeks   Status On-going     PT LONG TERM GOAL #5   Title Increase Berg score to 44-45/56.   Time 8   Period Weeks   Status On-going               Plan - 07/27/16 1210    Clinical Impression Statement Patient tolerated today's treatment fair as he arrived with wife reporting his noncompliance with ramps and his rollator. Patient was guided through exercises with PTA providing VCs for proper technique as well as demo. PTA also monitored reps as patient would count ahead to get through exercises quicker. Patient had difficulty with sit to stands today even with VCs for sitting closer to EOB but patient remained in same spot on table. Patient became slighty disgruntled at the difficulty with sit to stands and reported fatigue. Patient also required VCs to slow down. Patient educated with wife present that use of ramps and rollator is for his safety which is top priority. Appropriate ankle strategies noted with balance activiites on airex pad.   Clinical Impairments Affecting Rehab Potential Poor balance.  H/o CVA.   PT Frequency 2x / week   PT Duration 8 weeks   PT Treatment/Interventions ADLs/Self Care Home Management;Functional mobility training;Stair training;Gait training;Therapeutic activities;Therapeutic exercise;Balance  training;Neuromuscular re-education;Patient/family education   PT Next Visit Plan Please monitor BP; HR and 02 sat.  Bilateral LE strengthening to include core exercises.  Gait and balance activites.   Consulted and  Agree with Plan of Care Patient      Patient will benefit from skilled therapeutic intervention in order to improve the following deficits and impairments:  Abnormal gait, Decreased activity tolerance, Decreased balance, Decreased mobility, Decreased coordination, Decreased strength, Difficulty walking  Visit Diagnosis: Unsteadiness on feet  Muscle weakness (generalized)     Problem List Patient Active Problem List   Diagnosis Date Noted  . Frontal lobe and executive function deficit following cerebral infarction 07/02/2016  . Neurologic gait disorder   . Coronary artery disease involving coronary bypass graft of native heart without angina pectoris   . Benign essential HTN   . Acute kidney injury (HCC)   . Nausea & vomiting 06/28/2016  . Ischemic stroke of frontal lobe (HCC) 06/28/2016  . Thrombocytopenia (HCC) 06/28/2016  . Renal failure (ARF), acute on chronic (HCC) 06/28/2016  . Stroke (cerebrum) (HCC) 06/28/2016  . Acute CVA (cerebrovascular accident) (HCC) 06/28/2016  . Insomnia 02/02/2016  . Atrial fibrillation (HCC) 10/27/2015  . Uncoordinated movements 02/24/2014  . Unspecified hereditary and idiopathic peripheral neuropathy 02/24/2013  . Low back pain 02/24/2013  . Abnormality of gait 02/09/2013  . Memory loss, short term 12/05/2011  . LUMBAR STRAIN, ACUTE 05/05/2010  . OSA (obstructive sleep apnea) 09/14/2009  . BPH (benign prostatic hyperplasia) 07/27/2009  . CARDIOVASCULAR STUDIES, ABNORMAL 07/27/2009  . CHEST PAIN 06/08/2009  . OSTEOARTHRITIS 05/30/2009  . HEEL PAIN, LEFT 05/24/2009  . Dyspnea on exertion 05/24/2009  . ADJUSTMENT DISORDER WITH MIXED FEATURES 08/25/2008  . Diabetes type 2, uncontrolled (HCC) 02/24/2008  . ERECTILE DYSFUNCTION  02/10/2007  . Hyperlipidemia 10/09/2006  . OBESITY 10/09/2006  . Essential hypertension 10/09/2006  . Coronary atherosclerosis 10/09/2006  . GERD 10/09/2006  . DM (diabetes mellitus) type II uncontrolled with retinopathy 06/10/2006    Evelene Croon, PTA 07/27/2016, 12:17 PM  [name] [address] [place], [place], [address] Phone: [phone]   Fax:  [phone]  Name: Luis Yu MRN: [id number] Date of Birth: 1945-08-26

## 2016-07-27 NOTE — Telephone Encounter (Signed)
Noted  

## 2016-07-30 ENCOUNTER — Encounter: Payer: Medicare Other | Admitting: Physical Therapy

## 2016-07-31 ENCOUNTER — Encounter: Payer: Self-pay | Admitting: *Deleted

## 2016-07-31 ENCOUNTER — Ambulatory Visit: Payer: Medicare Other | Admitting: *Deleted

## 2016-07-31 ENCOUNTER — Other Ambulatory Visit: Payer: Self-pay | Admitting: *Deleted

## 2016-07-31 DIAGNOSIS — R2681 Unsteadiness on feet: Secondary | ICD-10-CM

## 2016-07-31 DIAGNOSIS — I69314 Frontal lobe and executive function deficit following cerebral infarction: Secondary | ICD-10-CM

## 2016-07-31 DIAGNOSIS — M6281 Muscle weakness (generalized): Secondary | ICD-10-CM | POA: Diagnosis not present

## 2016-07-31 NOTE — Therapy (Signed)
[name] [address] [place], [place], [address] Phone: [phone]   Fax:  [phone]  Physical Therapy Treatment  Patient Details  Name: Luis Yu MRN: [id number] Date of Birth: Oct 06, 1945 Referring Provider: Claudette Laws, MD  Encounter Date: 07/31/2016      PT End of Session - 07/31/16 1352    Visit Number 8   Number of Visits 16   Date for PT Re-Evaluation 09/08/16   PT Start Time 1345   PT Stop Time 1436   PT Time Calculation (min) 51 min      Past Medical History:  Diagnosis Date  . Adjustment disorder with mixed emotional features   . Atrial flutter (HCC)   . CAD (coronary artery disease)   . Depression   . Diabetes mellitus type II, uncontrolled (HCC)   . Diabetic peripheral neuropathy (HCC)   . Erectile dysfunction   . GERD (gastroesophageal reflux disease)   . Herpes zoster   . Hyperlipidemia   . Hypertension   . Obesity   . Osteoarthritis   . Postherpetic neuralgia   . Sleep apnea     Past Surgical History:  Procedure Laterality Date  . CARDIOVERSION N/A 11/10/2015   Procedure: CARDIOVERSION;  Surgeon: Laurey Morale, MD;  Location: [facility] ENDOSCOPY;  Service: Cardiovascular;  Laterality: N/A;  . CARDIOVERSION N/A 02/21/2016   Procedure: CARDIOVERSION;  Surgeon: Lewayne Bunting, MD;  Location: [facility] ENDOSCOPY;  Service: Cardiovascular;  Laterality: N/A;  . CORONARY ARTERY BYPASS GRAFT  1996   eith a LIMA to the LAD, saphenous vein graft to the acute marginal, saphenous vein graft to the PDA and saphenous vein graft to the circumflex.  [name] KNEE ARTHROSCOPY  05/2004   right knee  . REPLACEMENT TOTAL KNEE BILATERAL    . SHOULDER SURGERY      There were no vitals filed for this visit.      Subjective Assessment - 07/31/16 1351    Subjective Wife reports that patient is wanting to use stairs instead of ramps and that patient tells her that the walker is making him weaker. Reports that she says patient  does not listen.   Patient is accompained by: Family member   Pertinent History Recent falls.  Peripheral neuropathy. Chronic atrial fib.; bil TKA's.   How long can you walk comfortably? Short distances with a FWW.   Diagnostic tests MRI; CT   Currently in Pain? No/denies                         [facility] Adult PT Treatment/Exercise - 07/31/16 0001      Knee/Hip Exercises: Aerobic   Nustep Level 5 x 15 minutes.     Knee/Hip Exercises: Machines for Strengthening   Cybex Knee Extension 10# x 3 minutes.   Cybex Knee Flexion 40# x 3 minutes.     Knee/Hip Exercises: Standing   Heel Raises Both;2 sets;10 reps   Hip Flexion AROM;Both;2 sets;Knee bent;20 reps   Forward Step Up 2 sets;20 reps;Step Height: 6"  Toe taps 1 Hand on bars   Other Standing Knee Exercises Sidestep in // bars x 10 RT     Knee/Hip Exercises: Seated   Sit to Sand 15 reps;without UE support             Balance Exercises - 07/31/16 1413      Balance Exercises: Standing   Standing Eyes Opened Foam/compliant surface   Standing Eyes Closed Foam/compliant surface  PT Short Term Goals - 07/23/16 1434      PT SHORT TERM GOAL #1   Title Independent with an initial HEP.   Time 4   Period Weeks   Status Achieved     PT SHORT TERM GOAL #2   Title Increase Berg score to 32/56.   Time 4   Period Weeks   Status On-going  27/56 07/23/16           PT Long Term Goals - 07/16/16 0930      PT LONG TERM GOAL #1   Title Independent with an advanced HEP.   Time 8   Period Weeks   Status On-going     PT LONG TERM GOAL #2   Title Increase bilateral LE strength to a solid 4+/5 to increase stability for functional activites.   Time 8   Period Weeks   Status On-going     PT LONG TERM GOAL #3   Title Walk in clinic with FWW 500 feet with supervision only without resting and 02 not going below 93%.   Time 8   Period Weeks   Status On-going     PT LONG TERM GOAL #4   Title  Perform a reciprocating stair gait with one railing.   Time 8   Period Weeks   Status On-going     PT LONG TERM GOAL #5   Title Increase Berg score to 44-45/56.   Time 8   Period Weeks   Status On-going               Plan - 07/31/16 1421    Clinical Impression Statement Pt arrived to clinic today doing fairly well. His O2 was 97% and HR 78. He was ambulating with rollator and states today he fell when he didn't have it. He was more inclined to  use it today. He did well with Exs and balance act.'s and needed less assistance. He also did better with sit to stand using UEs on chair and on knees at times.   Clinical Impairments Affecting Rehab Potential Poor balance.  H/o CVA.   PT Frequency 2x / week   PT Duration 8 weeks   PT Treatment/Interventions ADLs/Self Care Home Management;Functional mobility training;Stair training;Gait training;Therapeutic activities;Therapeutic exercise;Balance training;Neuromuscular re-education;Patient/family education   PT Next Visit Plan Please monitor BP; HR and 02 sat.  Bilateral LE strengthening to include core exercises.  Gait and balance activites.   Consulted and Agree with Plan of Care Patient      Patient will benefit from skilled therapeutic intervention in order to improve the following deficits and impairments:  Abnormal gait, Decreased activity tolerance, Decreased balance, Decreased mobility, Decreased coordination, Decreased strength, Difficulty walking  Visit Diagnosis: Unsteadiness on feet  Muscle weakness (generalized)  Frontal lobe and executive function deficit following cerebral infarction     Problem List Patient Active Problem List   Diagnosis Date Noted  . Frontal lobe and executive function deficit following cerebral infarction 07/02/2016  . Neurologic gait disorder   . Coronary artery disease involving coronary bypass graft of native heart without angina pectoris   . Benign essential HTN   . Acute kidney injury  (HCC)   . Nausea & vomiting 06/28/2016  . Ischemic stroke of frontal lobe (HCC) 06/28/2016  . Thrombocytopenia (HCC) 06/28/2016  . Renal failure (ARF), acute on chronic (HCC) 06/28/2016  . Stroke (cerebrum) (HCC) 06/28/2016  . Acute CVA (cerebrovascular accident) (HCC) 06/28/2016  . Insomnia 02/02/2016  . Atrial fibrillation (  HCC) 10/27/2015  . Uncoordinated movements 02/24/2014  . Unspecified hereditary and idiopathic peripheral neuropathy 02/24/2013  . Low back pain 02/24/2013  . Abnormality of gait 02/09/2013  . Memory loss, short term 12/05/2011  . LUMBAR STRAIN, ACUTE 05/05/2010  . OSA (obstructive sleep apnea) 09/14/2009  . BPH (benign prostatic hyperplasia) 07/27/2009  . CARDIOVASCULAR STUDIES, ABNORMAL 07/27/2009  . CHEST PAIN 06/08/2009  . OSTEOARTHRITIS 05/30/2009  . HEEL PAIN, LEFT 05/24/2009  . Dyspnea on exertion 05/24/2009  . ADJUSTMENT DISORDER WITH MIXED FEATURES 08/25/2008  . Diabetes type 2, uncontrolled (HCC) 02/24/2008  . ERECTILE DYSFUNCTION 02/10/2007  . Hyperlipidemia 10/09/2006  . OBESITY 10/09/2006  . Essential hypertension 10/09/2006  . Coronary atherosclerosis 10/09/2006  . GERD 10/09/2006  . DM (diabetes mellitus) type II uncontrolled with retinopathy 06/10/2006    Telly Jawad,CHRIS 07/31/2016, 3:06 PM  [name] [address] [place], [place], [address] Phone: [phone]   Fax:  [phone]  Name: Doretha ImusDonald W Yu MRN: [id number] Date of Birth: 02/28/1946

## 2016-07-31 NOTE — Patient Outreach (Signed)
Referral received from primary care MD, diagnosis post CVA, unsteady gait, RN CM called pt for screening, spoke with pt, HIPAA verified, permission given to speak with wife Isa RankinGlenna Meany. Per wife, pt has had 5 falls since August and is now in outpatient physical therapy 3 times per week and states " this is helping"  Pt uses walker and wife reports " they said he didn't need a wheelchair yet"  Pt has built in shower seat and pt able to bathe himself and wife oversees pt and is there to assist as needed with bathing and any other aspects of care. Pt has all medications and taking as prescribed per wife and pt is able to afford medications, wife reports no concerns with medications.  Wife checks CBG TID with ranges 116-139 and states " no issues with his blood sugar"  Wife states pt sleeps a lot during the day and therefore does not always sleep good at night, is now on medication for sleeping and this has helped some, RN CM ask wife to try and keep pt awake during the day so he will sleep better at night.  Wife states pt has applied for medicaid before and did not qualify and [facility] at home [facility] this week.  RN CM discussed resources for wife regarding caregivers in the home and wife states she does not know how pt would react having someone in their home, she states pt is impulsive.  Pt has daughter but never sees her, there are no relatives or other acquaintances to ask for assistance.  RN CM ask wife to be thinking about arranging some intermittent assistance in the home as a back up plan if needed.  Wife says she will consider.  Wife feels there are no nursing needs at present and states she is not sure if there are really any CSW needs but she is willing to speak with CSW at home visit this week.  RNCM faxed letter to primary MD office reporting no nursing needs, sent update to CSW [employer].  Irving ShowsJulie Cantrell [place] [facility], BSN [facility] Community Care Coordinator [phone]

## 2016-08-01 ENCOUNTER — Ambulatory Visit: Payer: Medicare Other | Admitting: Physical Therapy

## 2016-08-01 ENCOUNTER — Encounter: Payer: Self-pay | Admitting: Physical Therapy

## 2016-08-01 DIAGNOSIS — R2681 Unsteadiness on feet: Secondary | ICD-10-CM

## 2016-08-01 DIAGNOSIS — M6281 Muscle weakness (generalized): Secondary | ICD-10-CM

## 2016-08-01 DIAGNOSIS — I69314 Frontal lobe and executive function deficit following cerebral infarction: Secondary | ICD-10-CM | POA: Diagnosis not present

## 2016-08-01 NOTE — Therapy (Signed)
[name] [address] [place], [place], [address] Phone: [phone]   Fax:  [phone]  Physical Therapy Treatment  Patient Details  Name: Luis Yu MRN: [id number] Date of Birth: [birth year] Referring Provider: Claudette LawsAndrew Kirsteins, MD  Encounter Date: 08/01/2016      PT End of Session - 08/01/16 1450    Visit Number 9   Number of Visits 16   Date for PT Re-Evaluation 09/08/16   PT Start Time 1400   PT Stop Time 1445   PT Time Calculation (min) 45 min   Equipment Utilized During Treatment Gait belt;Other (comment)   Activity Tolerance Patient tolerated treatment well   Behavior [facility] Therapy [facility] for tasks assessed/performed      Past Medical History:  Diagnosis Date  . Adjustment disorder with mixed emotional features   . Atrial flutter (HCC)   . CAD (coronary artery disease)   . Depression   . Diabetes mellitus type II, uncontrolled (HCC)   . Diabetic peripheral neuropathy (HCC)   . Erectile dysfunction   . GERD (gastroesophageal reflux disease)   . Herpes zoster   . Hyperlipidemia   . Hypertension   . Obesity   . Osteoarthritis   . Postherpetic neuralgia   . Sleep apnea     Past Surgical History:  Procedure Laterality Date  . CARDIOVERSION N/A 11/10/2015   Procedure: CARDIOVERSION;  Surgeon: Laurey Moralealton S McLean, MD;  [facility];  Service: Cardiovascular;  Laterality: N/A;  . CARDIOVERSION N/A 02/21/2016   Procedure: CARDIOVERSION;  Surgeon: Lewayne BuntingBrian S Crenshaw, MD;  [facility];  Service: Cardiovascular;  Laterality: N/A;  . CORONARY ARTERY BYPASS GRAFT  1996   eith a LIMA to the LAD, saphenous vein graft to the acute marginal, saphenous vein graft to the PDA and saphenous vein graft to the circumflex.  [name]. KNEE ARTHROSCOPY  05/2004   right knee  . REPLACEMENT TOTAL KNEE BILATERAL    . SHOULDER SURGERY      There were no vitals filed for this visit.      Subjective Assessment - 08/01/16 1420     Subjective Patient reported doing well and no falls reported   Patient is accompained by: Family member   Pertinent History Recent falls.  Peripheral neuropathy. Chronic atrial fib.; bil TKA's.   How long can you walk comfortably? Short distances with a FWW.   Diagnostic tests MRI; CT   Currently in Pain? No/denies                         [facility]/Exercise - 08/01/16 0001      Knee/Hip Exercises: Aerobic   Nustep Level 5 x 15 minutes UE/LE, monitored for progression     Knee/Hip Exercises: Machines for Strengthening   Cybex Knee Extension 10# 2x15   Cybex Knee Flexion 40# 2x15     Knee/Hip Exercises: Standing   Other Standing Knee Exercises sit to stand with 80# deload with CGA 3x10  cues for correct technique     Knee/Hip Exercises: Supine   Other Supine Knee/Hip Exercises suine manual transverse plane resistance in hooklying 3xfatigue             Balance Exercises - 08/01/16 1448      Balance Exercises: Standing   Standing Eyes Opened Wide (BOA)  with UE "V"x10 and scap retraction x10   Heel Raises Limitations x10   Toe Raise Limitations x10   Other Standing Exercises tandem  stance with ext x10             PT Short Term Goals - 07/23/16 1434      PT SHORT TERM GOAL #1   Title Independent with an initial HEP.   Time 4   Period Weeks   Status Achieved     PT SHORT TERM GOAL #2   Title Increase Berg score to 32/56.   Time 4   Period Weeks   Status On-going  27/56 07/23/16           PT Long Term Goals - 07/16/16 0930      PT LONG TERM GOAL #1   Title Independent with an advanced HEP.   Time 8   Period Weeks   Status On-going     PT LONG TERM GOAL #2   Title Increase bilateral LE strength to a solid 4+/5 to increase stability for functional activites.   Time 8   Period Weeks   Status On-going     PT LONG TERM GOAL #3   Title Walk in clinic with FWW 500 feet with supervision only without resting and 02  not going below 93%.   Time 8   Period Weeks   Status On-going     PT LONG TERM GOAL #4   Title Perform a reciprocating stair gait with one railing.   Time 8   Period Weeks   Status On-going     PT LONG TERM GOAL #5   Title Increase Berg score to 44-45/56.   Time 8   Period Weeks   Status On-going               Plan - 08/01/16 1451    Clinical Impression Statement Patient tolerated treatment well today. Patient required rest breaks and cues throughout for technique and pace. Patient able to progress with balance activities and strengthening. No falls reported. Patient progressing toward goals yet ongoing due to strength and balance deficts, vitals WNL today.   Clinical Impairments Affecting Rehab Potential Poor balance.  H/o CVA.   PT Frequency 2x / week   PT Duration 8 weeks   PT Treatment/Interventions ADLs/Self Care Home Management;Functional mobility training;Stair training;Gait training;Therapeutic activities;Therapeutic exercise;Balance training;Neuromuscular re-education;Patient/family education   PT Next Visit Plan Please monitor BP; HR and 02 sat.  Bilateral LE strengthening to include core exercises.  Gait and balance activites.   Consulted and Agree with Plan of Care Patient;Family member/caregiver      Patient will benefit from skilled therapeutic intervention in order to improve the following deficits and impairments:  Abnormal gait, Decreased activity tolerance, Decreased balance, Decreased mobility, Decreased coordination, Decreased strength, Difficulty walking  Visit Diagnosis: Unsteadiness on feet  Muscle weakness (generalized)     Problem List Patient Active Problem List   Diagnosis Date Noted  . Frontal lobe and executive function deficit following cerebral infarction 07/02/2016  . Neurologic gait disorder   . Coronary artery disease involving coronary bypass graft of native heart without angina pectoris   . Benign essential HTN   . Acute  kidney injury (HCC)   . Nausea & vomiting 06/28/2016  . Ischemic stroke of frontal lobe (HCC) 06/28/2016  . Thrombocytopenia (HCC) 06/28/2016  . Renal failure (ARF), acute on chronic (HCC) 06/28/2016  . Stroke (cerebrum) (HCC) 06/28/2016  . Acute CVA (cerebrovascular accident) (HCC) 06/28/2016  . Insomnia 02/02/2016  . Atrial fibrillation (HCC) 10/27/2015  . Uncoordinated movements 02/24/2014  . Unspecified hereditary and idiopathic peripheral neuropathy 02/24/2013  .  Low back pain 02/24/2013  . Abnormality of gait 02/09/2013  . Memory loss, short term 12/05/2011  . LUMBAR STRAIN, ACUTE 05/05/2010  . OSA (obstructive sleep apnea) 09/14/2009  . BPH (benign prostatic hyperplasia) 07/27/2009  . CARDIOVASCULAR STUDIES, ABNORMAL 07/27/2009  . CHEST PAIN 06/08/2009  . OSTEOARTHRITIS 05/30/2009  . HEEL PAIN, LEFT 05/24/2009  . Dyspnea on exertion 05/24/2009  . ADJUSTMENT DISORDER WITH MIXED FEATURES 08/25/2008  . Diabetes type 2, uncontrolled (HCC) 02/24/2008  . ERECTILE DYSFUNCTION 02/10/2007  . Hyperlipidemia 10/09/2006  . OBESITY 10/09/2006  . Essential hypertension 10/09/2006  . Coronary atherosclerosis 10/09/2006  . GERD 10/09/2006  . DM (diabetes mellitus) type II uncontrolled with retinopathy 06/10/2006    Luis Yu, Luis Yu 08/01/2016, 2:56 PM  [facility] [address] [place], [place], [address] Phone: [phone]   Fax:  [phone]  Name: Luis Yu MRN: [id number] Date of Birth: 1945/05/19

## 2016-08-02 ENCOUNTER — Encounter: Payer: Self-pay | Admitting: *Deleted

## 2016-08-02 ENCOUNTER — Other Ambulatory Visit: Payer: Self-pay | Admitting: *Deleted

## 2016-08-02 NOTE — Patient Outreach (Signed)
Luis Yu Luis Yu) Care Management  [place]  08/02/2016   Luis Yu 18-Jan-1946 [id number]  Subjective: "I know my wife is getting burnt out taking care of me"   Encounter Medications:  Outpatient Encounter Prescriptions as of 08/02/2016  Medication Sig  . acetaminophen (TYLENOL) 500 MG tablet Take 2 tablets (1,000 mg total) by mouth every 6 (six) hours as needed. (Patient taking differently: Take 1,000 mg by mouth every 6 (six) hours as needed for mild pain. )  . apixaban (ELIQUIS) 5 MG TABS tablet Take 1 tablet (5 mg total) by mouth 2 (two) times daily.  [name] aspirin EC 81 MG tablet Take 1 tablet (81 mg total) by mouth daily.  [name] atorvastatin (LIPITOR) 80 MG tablet Take 1 tablet (80 mg total) by mouth daily.  [name] buPROPion (WELLBUTRIN SR) 150 MG 12 hr tablet Take 1 tablet (150 mg total) by mouth 2 (two) times daily.  [name] glipiZIDE (GLUCOTROL) 10 MG tablet Take 10 mg by mouth daily.  [name] glucose blood (ACCU-CHEK ACTIVE STRIPS) test strip Use as instructed  . insulin aspart (NOVOLOG) 100 UNIT/ML injection Correction coverage: Sensitive (thin, NPO, renal)  CBG < 70: implement hypoglycemia protocol  CBG 70 - 120: 0 units  CBG 121 - 150: 1 unit  CBG 151 - 200: 2 units  CBG 201 - 250: 3 units  CBG 251 - 300: 5 units  CBG 301 - 350: 7 units  CBG 351 - 400 9 units  CBG > 400 call MD and obtain STAT lab verification  . Insulin Glargine (BASAGLAR KWIKPEN) 100 UNIT/ML SOPN Inject 0.25 mLs (25 Units total) into the skin at bedtime. (Patient taking differently: Inject 20 Units into the skin at bedtime. )  . Insulin Pen Needle (PEN NEEDLES 31GX5/16") 31G X 8 MM MISC Use to inject insulin 5 times a day.  . isosorbide mononitrate (IMDUR) 30 MG 24 hr tablet Take 1 tablet (30 mg total) by mouth daily.  . Lancets (ACCU-CHEK MULTICLIX) lancets Use up to 5 times a day to check blood sugar.  . metFORMIN (GLUCOPHAGE) 500 MG tablet Take 1 tablet (500 mg total) by mouth 2 (two) times daily  with a meal.  . mirtazapine (REMERON) 15 MG tablet Take 1 tablet (15 mg total) by mouth at bedtime.  . nebivolol 20 MG TABS Take 1 tablet (20 mg total) by mouth daily.  [name] omeprazole (PRILOSEC) 20 MG capsule TAKE ONE CAPSULE TWICE A DAY BEFORE A MEAL  . thiamine 100 MG tablet Take 1 tablet (100 mg total) by mouth daily.  . vitamin B-12 (CYANOCOBALAMIN) 1000 MCG tablet Take 2 tablets (2,000 mcg total) by mouth daily.   No facility-administered encounter medications on file as of 08/02/2016.     Functional Status:  In your present state of health, do you have any difficulty performing the following activities: 08/02/2016 07/02/2016  Hearing? N Y  Vision? Y N  Difficulty concentrating or making decisions? Y N  Walking or climbing stairs? Y Y  Dressing or bathing? N Y  Doing errands, shopping? Y N  Some recent data might be hidden    Fall/Depression Screening: Fall Risk  08/02/2016 07/16/2016 11/03/2015  Falls in the past year? Yes No Yes  Number falls in past yr: 1 - 1  Injury with Fall? Yes - No  Risk Factor Category  High Fall Risk - -  Risk for fall due to : History of fall(s);Impaired balance/gait;Mental status change - -  Follow up Falls  prevention discussed;Education provided - Falls evaluation completed;Education provided;Falls prevention discussed   PHQ 2/9 Scores 08/02/2016 07/31/2016 07/16/2016 11/03/2015 05/13/2015 02/24/2014 02/09/2013  PHQ - 2 Score _0 0 0 0 0  PHQ- 9 Score 11 - 7 - - - -    Assessment:  CSW met with patient & his wife at their home for the initial home visit, referral received 07/19/16 from Luis Yu to evaluate support system and long term risk for placement. CSW completed initial assessment - encouraged that they locate advance directives and bring them to their next doctors appointment to have them scan into the system or call CSW and will scan them in on next home visit. Patient's wife expressed concerns of patient's impulsivity & safety issues  at times needs cueing and redirection later in the afternoon (possible sundowning?). CSW provided information to patient & wife a brochure on Aging, [facility] Yu and their Companion Care Services - patient's wife was unsure prior to CSW how patient would react to CSW visit stating "he can be grumpy sometimes" but patient was pleasant and even acknowledged his wife's hard work and care for him. Patient was agreeable to having someone come to the house so she could go to her own appointments and not be afraid to leave patient at home.   Patient also expressed that he is tired of being inside their house and only leaving for doctors appointments and outpatient therapy (3x/week). CSW provided information on [facility] as well as [facility] in [facility] and encouraged them to call or visit. Patient was suprisingly very open to the idea stating "I'd love to get back to playing poker". Patients wife believes that the [facility] Yu is close to where patient goes for outpatient therapy and plans to call or stop by on the way home from his next appointment. Patient had scored 11 on depression scale/PHQ-9, reports that he is weaning off Celexa & has started Mirtazapine which he feels he has been benefiting from.   Plan:   [facility] CM Care Plan Problem One     Most Recent Value  Care Plan Problem One  Patient in need of additional support & activities to get out of the home  Role Documenting the Problem One  [place] for Problem One  Active  [facility] Long Term Goal (31-90 days)  Patient & wife will discuss with CSW community resources - companion care services & [facility] resources within the next 45 days  [place] Term Goal Start Date  08/02/16  Interventions for Problem One Long Term Goal  CSW provided Aging, [place] brochure with Companion Care Services included. CSW discussed with patient  & wife Luis centers to go to for activities to get out of the house.   [facility] CM Short Term Goal #1 (0-30 days)  Patient/wife will call [facility] to explore options for companion care for wife to have respite care within the next 3 weeks  [facility] CM Short Term Goal #1 Start Date  08/02/16  Interventions for Short Term Goal #1  CSW provided information to patient/wife to call & explained the need to patient for wife to "recharge"      CSW will follow-up with patient/wife in 3 weeks to further discuss [facility] centers and other community resources that could be of benefit to patient.    Raynaldo Opitz, LCSW [facility]  Clinical Social Worker cell #: [phone]

## 2016-08-03 ENCOUNTER — Ambulatory Visit: Payer: Medicare Other | Admitting: *Deleted

## 2016-08-03 DIAGNOSIS — I69314 Frontal lobe and executive function deficit following cerebral infarction: Secondary | ICD-10-CM | POA: Diagnosis not present

## 2016-08-03 DIAGNOSIS — M6281 Muscle weakness (generalized): Secondary | ICD-10-CM | POA: Diagnosis not present

## 2016-08-03 DIAGNOSIS — R2681 Unsteadiness on feet: Secondary | ICD-10-CM | POA: Diagnosis not present

## 2016-08-03 NOTE — Therapy (Signed)
[name] [place], [name], [address] Phone: [phone]   Fax:  [phone]  Physical Therapy Treatment  Patient Details  Name: Luis Yu MRN: [id number] Date of Birth: 24-Nov-1945 Referring Provider: Alysia Penna, MD  Encounter Date: 08/03/2016      PT End of Session - 08/03/16 1217    Visit Number 10   Number of Visits 16   Date for PT Re-Evaluation 09/08/16   PT Start Time 7893   PT Stop Time 1207   PT Time Calculation (min) 52 min   Equipment Utilized During Treatment Gait belt;Other (comment)   Activity Tolerance Patient tolerated treatment well   Behavior During Therapy [facility] for tasks assessed/performed      Past Medical History:  Diagnosis Date  . Adjustment disorder with mixed emotional features   . Atrial flutter ([place])   . CAD (coronary artery disease)   . Depression   . Diabetes mellitus type II, uncontrolled ([place])   . Diabetic peripheral neuropathy ([place])   . Erectile dysfunction   . GERD (gastroesophageal reflux disease)   . Herpes zoster   . Hyperlipidemia   . Hypertension   . Obesity   . Osteoarthritis   . Postherpetic neuralgia   . Sleep apnea     Past Surgical History:  Procedure Laterality Date  . CARDIOVERSION N/A 11/10/2015   Procedure: CARDIOVERSION;  Surgeon: Larey Dresser, MD;  Location: [facility];  Service: Cardiovascular;  Laterality: N/A;  . CARDIOVERSION N/A 02/21/2016   Procedure: CARDIOVERSION;  Surgeon: Lelon Perla, MD;  [facility];  Service: Cardiovascular;  Laterality: N/A;  . CORONARY ARTERY BYPASS GRAFT  1996   eith a LIMA to the LAD, saphenous vein graft to the acute marginal, saphenous vein graft to the PDA and saphenous vein graft to the circumflex.  [name] KNEE ARTHROSCOPY  05/2004   right knee  . REPLACEMENT TOTAL KNEE BILATERAL    . SHOULDER SURGERY      There were no vitals filed for this visit.      Subjective Assessment - 08/03/16  1121    Subjective Patient reported doing well and no falls    Patient is accompained by: Family member   Pertinent History Recent falls.  Peripheral neuropathy. Chronic atrial fib.; bil TKA's.   How long can you walk comfortably? Short distances with a FWW.   Diagnostic tests MRI; CT   Currently in Pain? No/denies   Pain Score 3    Pain Orientation Right   Pain Descriptors / Indicators Aching                         [facility] Adult PT Treatment/Exercise - 08/03/16 0001      Knee/Hip Exercises: Aerobic   Nustep Level 5 x 15 minutes UE/LE, monitored for progression     Knee/Hip Exercises: Machines for Strengthening   Cybex Knee Extension 20# x 3 minutes.   Cybex Knee Flexion 40# x 3 minutes.     Knee/Hip Exercises: Seated   Sit to Sand 20 reps;with UE support;without UE support             Balance Exercises - 08/03/16 1207      Balance Exercises: Standing   Standing Eyes Opened Foam/compliant surface;Solid surface  UE reaches 4 x 20   Step Ups Forward;6 inch;Intermittent UE support  SBA  3 sets of 20  PT Short Term Goals - 07/23/16 1434      PT SHORT TERM GOAL #1   Title Independent with an initial HEP.   Time 4   Period Weeks   Status Achieved     PT SHORT TERM GOAL #2   Title Increase Berg score to 32/56.   Time 4   Period Weeks   Status On-going  27/56 07/23/16           PT Long Term Goals - 07/16/16 0930      PT LONG TERM GOAL #1   Title Independent with an advanced HEP.   Time 8   Period Weeks   Status On-going     PT LONG TERM GOAL #2   Title Increase bilateral LE strength to a solid 4+/5 to increase stability for functional activites.   Time 8   Period Weeks   Status On-going     PT LONG TERM GOAL #3   Title Walk in clinic with FWW 500 feet with supervision only without resting and 02 not going below 93%.   Time 8   Period Weeks   Status On-going     PT LONG TERM GOAL #4   Title Perform a reciprocating  stair gait with one railing.   Time 8   Period Weeks   Status On-going     PT LONG TERM GOAL #5   Title Increase Berg score to 44-45/56.   Time 8   Period Weeks   Status On-going               Plan - 08/03/16 1218    Clinical Impression Statement Pt arrived today doing fairly well with no reports of falls. Pt was easily aggravated  today during sit to stand due to losing his balance and needing more assistance during balance exs today. He needed more assistance with sit to stand today to gain balance when rising. We reviewed technique and he was able to do better. His vitals for O2 and HR were WNLs again. No new LTGs met today due to balances.   Clinical Impairments Affecting Rehab Potential Poor balance.  H/o CVA.   PT Frequency 2x / week   PT Duration 8 weeks   PT Treatment/Interventions ADLs/Self Care Home Management;Functional mobility training;Stair training;Gait training;Therapeutic activities;Therapeutic exercise;Balance training;Neuromuscular re-education;Patient/family education   PT Next Visit Plan Please monitor BP; HR and 02 sat.  Bilateral LE strengthening to include core exercises.  Gait and balance activites.   Consulted and Agree with Plan of Care Patient;Family member/caregiver      Patient will benefit from skilled therapeutic intervention in order to improve the following deficits and impairments:  Abnormal gait, Decreased activity tolerance, Decreased balance, Decreased mobility, Decreased coordination, Decreased strength, Difficulty walking  Visit Diagnosis: Unsteadiness on feet  Muscle weakness (generalized)  Frontal lobe and executive function deficit following cerebral infarction     Problem List Patient Active Problem List   Diagnosis Date Noted  . Frontal lobe and executive function deficit following cerebral infarction 07/02/2016  . Neurologic gait disorder   . Coronary artery disease involving coronary bypass graft of native heart without  angina pectoris   . Benign essential HTN   . Acute kidney injury ([place])   . Nausea & vomiting 06/28/2016  . Ischemic stroke of frontal lobe ([place]) 06/28/2016  . Thrombocytopenia ([place]) 06/28/2016  . Renal failure (ARF), acute on chronic (HCC) 06/28/2016  . Stroke (cerebrum) ([place]) 06/28/2016  . Acute CVA (cerebrovascular accident) ([place]) 06/28/2016  .  Insomnia 02/02/2016  . Atrial fibrillation ([place]) 10/27/2015  . Uncoordinated movements 02/24/2014  . Unspecified hereditary and idiopathic peripheral neuropathy 02/24/2013  . Low back pain 02/24/2013  . Abnormality of gait 02/09/2013  . Memory loss, short term 12/05/2011  . LUMBAR STRAIN, ACUTE 05/05/2010  . OSA (obstructive sleep apnea) 09/14/2009  . BPH (benign prostatic hyperplasia) 07/27/2009  . CARDIOVASCULAR STUDIES, ABNORMAL 07/27/2009  . CHEST PAIN 06/08/2009  . OSTEOARTHRITIS 05/30/2009  . HEEL PAIN, LEFT 05/24/2009  . Dyspnea on exertion 05/24/2009  . ADJUSTMENT DISORDER WITH MIXED FEATURES 08/25/2008  . Diabetes type 2, uncontrolled ([place]) 02/24/2008  . ERECTILE DYSFUNCTION 02/10/2007  . Hyperlipidemia 10/09/2006  . OBESITY 10/09/2006  . Essential hypertension 10/09/2006  . Coronary atherosclerosis 10/09/2006  . GERD 10/09/2006  . DM (diabetes mellitus) type II uncontrolled with retinopathy 06/10/2006    Kalee Mcclenathan,CHRIS, PTA 08/03/2016, 12:30 PM  [facility] [address] [place], [name], [address] Phone: [phone]   Fax:  [phone]  Name: Luis Yu MRN: [id number] Date of Birth: 30-Mar-1945

## 2016-08-06 ENCOUNTER — Ambulatory Visit: Payer: Medicare Other | Admitting: Physical Therapy

## 2016-08-06 ENCOUNTER — Encounter: Payer: Self-pay | Admitting: Physical Therapy

## 2016-08-06 DIAGNOSIS — M6281 Muscle weakness (generalized): Secondary | ICD-10-CM

## 2016-08-06 DIAGNOSIS — R2681 Unsteadiness on feet: Secondary | ICD-10-CM

## 2016-08-06 DIAGNOSIS — I69314 Frontal lobe and executive function deficit following cerebral infarction: Secondary | ICD-10-CM | POA: Diagnosis not present

## 2016-08-06 NOTE — Therapy (Signed)
[name] [address] [place], [place], [address] Phone: [phone]   Fax:  [phone]  Physical Therapy Treatment  Patient Details  Name: Luis Yu MRN: [id number] Date of Birth: 07-Sep-1945 Referring Provider: Claudette LawsAndrew Kirsteins, MD  Encounter Date: 08/06/2016      PT End of Session - 08/06/16 1436    Visit Number 11   Number of Visits 16   Date for PT Re-Evaluation 09/08/16   PT Start Time 1400   PT Stop Time 1434   PT Time Calculation (min) 34 min   Activity Tolerance Patient limited by fatigue   Behavior [facility] for tasks assessed/performed      Past Medical History:  Diagnosis Date  . Adjustment disorder with mixed emotional features   . Atrial flutter (HCC)   . CAD (coronary artery disease)   . Depression   . Diabetes mellitus type II, uncontrolled (HCC)   . Diabetic peripheral neuropathy (HCC)   . Erectile dysfunction   . GERD (gastroesophageal reflux disease)   . Herpes zoster   . Hyperlipidemia   . Hypertension   . Obesity   . Osteoarthritis   . Postherpetic neuralgia   . Sleep apnea     Past Surgical History:  Procedure Laterality Date  . CARDIOVERSION N/A 11/10/2015   Procedure: CARDIOVERSION;  Surgeon: Laurey Moralealton S McLean, MD;  [facility];  Service: Cardiovascular;  Laterality: N/A;  . CARDIOVERSION N/A 02/21/2016   Procedure: CARDIOVERSION;  Surgeon: Lewayne BuntingBrian S Crenshaw, MD;  [facility];  Service: Cardiovascular;  Laterality: N/A;  . CORONARY ARTERY BYPASS GRAFT  1996   eith a LIMA to the LAD, saphenous vein graft to the acute marginal, saphenous vein graft to the PDA and saphenous vein graft to the circumflex.  [name]. KNEE ARTHROSCOPY  05/2004   right knee  . REPLACEMENT TOTAL KNEE BILATERAL    . SHOULDER SURGERY      There were no vitals filed for this visit.      Subjective Assessment - 08/06/16 1409    Subjective Patient arrived very fatigue today BP 139/81 pre tx   Patient is accompained by: Family member   Pertinent History Recent falls.  Peripheral neuropathy. Chronic atrial fib.; bil TKA's.   How long can you walk comfortably? Short distances with a FWW.   Diagnostic tests MRI; CT   Currently in Pain? No/denies                         [facility]/Exercise - 08/06/16 0001      Knee/Hip Exercises: Aerobic   Nustep Level 5 x 10 minutes UE/LE, monitored for progression  BP 120/74 HR 75 O2 98% post nustep     Knee/Hip Exercises: Seated   Long Arc Quad Strengthening;Both;2 sets;10 reps   Long Arc Quad Weight 4 lbs.   Ball Squeeze x40   Clamshell with TheraBand Green  x40   Marching Limitations 2x10   Marching Weights 4 lbs.   Sit to Sand 10 reps;with UE support     Knee/Hip Exercises: Supine   Other Supine Knee/Hip Exercises patient seated with UE holding cane at 90 degrees with manual resistance in all planes for core activation 3xfatigue                  PT Short Term Goals - 07/23/16 1434      PT SHORT TERM GOAL #1   Title Independent with an initial  HEP.   Time 4   Period Weeks   Status Achieved     PT SHORT TERM GOAL #2   Title Increase Berg score to 32/56.   Time 4   Period Weeks   Status On-going  27/56 07/23/16           PT Long Term Goals - 07/16/16 0930      PT LONG TERM GOAL #1   Title Independent with an advanced HEP.   Time 8   Period Weeks   Status On-going     PT LONG TERM GOAL #2   Title Increase bilateral LE strength to a solid 4+/5 to increase stability for functional activites.   Time 8   Period Weeks   Status On-going     PT LONG TERM GOAL #3   Title Walk in clinic with FWW 500 feet with supervision only without resting and 02 not going below 93%.   Time 8   Period Weeks   Status On-going     PT LONG TERM GOAL #4   Title Perform a reciprocating stair gait with one railing.   Time 8   Period Weeks   Status On-going     PT LONG TERM GOAL #5   Title  Increase Berg score to 44-45/56.   Time 8   Period Weeks   Status On-going               Plan - 08/06/16 1437    Clinical Impression Statement Patient arrived very fatigue today and was limited with all exercises. Patient able to complete sitting exercises with ongoing cues and supervision. Patient weak today with seated manual resistance exercise and required rest breaks. Today ended treatment early due to fatigue. Vitals post treatment 114/71 96% 78bpm   Clinical Impairments Affecting Rehab Potential Poor balance.  H/o CVA.   PT Frequency 2x / week   PT Duration 8 weeks   PT Treatment/Interventions ADLs/Self Care Home Management;Functional mobility training;Stair training;Gait training;Therapeutic activities;Therapeutic exercise;Balance training;Neuromuscular re-education;Patient/family education   PT Next Visit Plan Please monitor BP; HR and 02 sat.  Bilateral LE strengthening to include core exercises.  Gait and balance activites.   Consulted and Agree with Plan of Care Patient      Patient will benefit from skilled therapeutic intervention in order to improve the following deficits and impairments:  Abnormal gait, Decreased activity tolerance, Decreased balance, Decreased mobility, Decreased coordination, Decreased strength, Difficulty walking  Visit Diagnosis: Unsteadiness on feet  Muscle weakness (generalized)     Problem List Patient Active Problem List   Diagnosis Date Noted  . Frontal lobe and executive function deficit following cerebral infarction 07/02/2016  . Neurologic gait disorder   . Coronary artery disease involving coronary bypass graft of native heart without angina pectoris   . Benign essential HTN   . Acute kidney injury (HCC)   . Nausea & vomiting 06/28/2016  . Ischemic stroke of frontal lobe (HCC) 06/28/2016  . Thrombocytopenia (HCC) 06/28/2016  . Renal failure (ARF), acute on chronic (HCC) 06/28/2016  . Stroke (cerebrum) (HCC) 06/28/2016  .  Acute CVA (cerebrovascular accident) (HCC) 06/28/2016  . Insomnia 02/02/2016  . Atrial fibrillation (HCC) 10/27/2015  . Uncoordinated movements 02/24/2014  . Unspecified hereditary and idiopathic peripheral neuropathy 02/24/2013  . Low back pain 02/24/2013  . Abnormality of gait 02/09/2013  . Memory loss, short term 12/05/2011  . LUMBAR STRAIN, ACUTE 05/05/2010  . OSA (obstructive sleep apnea) 09/14/2009  . BPH (benign prostatic hyperplasia) 07/27/2009  .  CARDIOVASCULAR STUDIES, ABNORMAL 07/27/2009  . CHEST PAIN 06/08/2009  . OSTEOARTHRITIS 05/30/2009  . HEEL PAIN, LEFT 05/24/2009  . Dyspnea on exertion 05/24/2009  . ADJUSTMENT DISORDER WITH MIXED FEATURES 08/25/2008  . Diabetes type 2, uncontrolled (HCC) 02/24/2008  . ERECTILE DYSFUNCTION 02/10/2007  . Hyperlipidemia 10/09/2006  . OBESITY 10/09/2006  . Essential hypertension 10/09/2006  . Coronary atherosclerosis 10/09/2006  . GERD 10/09/2006  . DM (diabetes mellitus) type II uncontrolled with retinopathy 06/10/2006    DUNFORD, CHRISTINA P, PTA 08/06/2016, 2:41 PM  [facility] [address] [place], [place], [address] Phone: [phone]   Fax:  [phone]  Name: Luis Yu MRN: [id number] Date of Birth: [birth year]

## 2016-08-08 ENCOUNTER — Encounter: Payer: Self-pay | Admitting: Physical Therapy

## 2016-08-08 ENCOUNTER — Ambulatory Visit: Payer: Medicare Other | Admitting: Physical Therapy

## 2016-08-08 DIAGNOSIS — R2681 Unsteadiness on feet: Secondary | ICD-10-CM | POA: Diagnosis not present

## 2016-08-08 DIAGNOSIS — M6281 Muscle weakness (generalized): Secondary | ICD-10-CM

## 2016-08-08 DIAGNOSIS — I69314 Frontal lobe and executive function deficit following cerebral infarction: Secondary | ICD-10-CM | POA: Diagnosis not present

## 2016-08-08 NOTE — Therapy (Signed)
[name] [address] [place], [place], [address] Phone: [phone]   Fax:  [phone]  Physical Therapy Treatment  Patient Details  Name: Luis Yu MRN: [id number] Date of Birth: 1945/08/14 Referring Provider: Claudette Laws, MD  Encounter Date: 08/08/2016      PT End of Session - 08/08/16 1438    Visit Number 12   Number of Visits 16   Date for PT Re-Evaluation 09/08/16   PT Start Time 1356   PT Stop Time 1442   PT Time Calculation (min) 46 min   Equipment Utilized During Treatment Gait belt;Other (comment)   Activity Tolerance Patient tolerated treatment well   Behavior During Therapy [facility] for tasks assessed/performed      Past Medical History:  Diagnosis Date  . Adjustment disorder with mixed emotional features   . Atrial flutter (HCC)   . CAD (coronary artery disease)   . Depression   . Diabetes mellitus type II, uncontrolled (HCC)   . Diabetic peripheral neuropathy (HCC)   . Erectile dysfunction   . GERD (gastroesophageal reflux disease)   . Herpes zoster   . Hyperlipidemia   . Hypertension   . Obesity   . Osteoarthritis   . Postherpetic neuralgia   . Sleep apnea     Past Surgical History:  Procedure Laterality Date  . CARDIOVERSION N/A 11/10/2015   Procedure: CARDIOVERSION;  Surgeon: Laurey Morale, MD;  Location[facility] ENDOSCOPY;  Service: Cardiovascular;  Laterality: N/A;  . CARDIOVERSION N/A 02/21/2016   Procedure: CARDIOVERSION;  Surgeon: Lewayne Bunting, MD;  Location: [facility] ENDOSCOPY;  Service: Cardiovascular;  Laterality: N/A;  . CORONARY ARTERY BYPASS GRAFT  1996   eith a LIMA to the LAD, saphenous vein graft to the acute marginal, saphenous vein graft to the PDA and saphenous vein graft to the circumflex.  [name] KNEE ARTHROSCOPY  05/2004   right knee  . REPLACEMENT TOTAL KNEE BILATERAL    . SHOULDER SURGERY      There were no vitals filed for this visit.      Subjective Assessment - 08/08/16  1403    Subjective Patient arrived feeling better today   Patient is accompained by: Family member   Pertinent History Recent falls.  Peripheral neuropathy. Chronic atrial fib.; bil TKA's.   How long can you walk comfortably? Short distances with a FWW.   Diagnostic tests MRI; CT   Currently in Pain? No/denies                         [facility] Adult PT Treatment/Exercise - 08/08/16 0001      Knee/Hip Exercises: Aerobic   Nustep L5 UE/LE activity     Knee/Hip Exercises: Machines for Strengthening   Cybex Knee Extension 20# x 3 minutes.   Cybex Knee Flexion 40# x 3 minutes.     Knee/Hip Exercises: Standing   Other Standing Knee Exercises sit to stand with 10# deload with CGA 3x10     Knee/Hip Exercises: Seated   Clamshell with TheraBand Green  x40   Marching Limitations x20 each LE deloading (seated)   Marching Weights 40 lbs.     Knee/Hip Exercises: Supine   Other Supine Knee/Hip Exercises patient seated with UE holding cane at 90 degrees with manual resistance in all planes for core activation 3xfatigue                  PT Short Term Goals - 07/23/16 1434  PT SHORT TERM GOAL #1   Title Independent with an initial HEP.   Time 4   Period Weeks   Status Achieved     PT SHORT TERM GOAL #2   Title Increase Berg score to 32/56.   Time 4   Period Weeks   Status On-going  27/56 07/23/16           PT Long Term Goals - 07/16/16 0930      PT LONG TERM GOAL #1   Title Independent with an advanced HEP.   Time 8   Period Weeks   Status On-going     PT LONG TERM GOAL #2   Title Increase bilateral LE strength to a solid 4+/5 to increase stability for functional activites.   Time 8   Period Weeks   Status On-going     PT LONG TERM GOAL #3   Title Walk in clinic with FWW 500 feet with supervision only without resting and 02 not going below 93%.   Time 8   Period Weeks   Status On-going     PT LONG TERM GOAL #4   Title Perform a  reciprocating stair gait with one railing.   Time 8   Period Weeks   Status On-going     PT LONG TERM GOAL #5   Title Increase Berg score to 44-45/56.   Time 8   Period Weeks   Status On-going               Plan - 08/08/16 1441    Clinical Impression Statement Patient tolerated treatment well today and was able to perform all exercises. Patient vitals during treatment up to 144/79, O2 98%, HR 78 and decreased down to 117/74 after resting. Patient improved with sit to stand and able to decrease deloading weight to 70# today. Patient required cues for technique. Patient  goals ongoing due to strength and balance deficts.   Clinical Impairments Affecting Rehab Potential Poor balance.  H/o CVA.   PT Frequency 2x / week   PT Duration 8 weeks   PT Treatment/Interventions ADLs/Self Care Home Management;Functional mobility training;Stair training;Gait training;Therapeutic activities;Therapeutic exercise;Balance training;Neuromuscular re-education;Patient/family education   PT Next Visit Plan Please monitor BP; HR and 02 sat.  Bilateral LE strengthening to include core exercises.  Gait and balance activites.   Consulted and Agree with Plan of Care Patient      Patient will benefit from skilled therapeutic intervention in order to improve the following deficits and impairments:     Visit Diagnosis: Muscle weakness (generalized)  Unsteadiness on feet     Problem List Patient Active Problem List   Diagnosis Date Noted  . Frontal lobe and executive function deficit following cerebral infarction 07/02/2016  . Neurologic gait disorder   . Coronary artery disease involving coronary bypass graft of native heart without angina pectoris   . Benign essential HTN   . Acute kidney injury (HCC)   . Nausea & vomiting 06/28/2016  . Ischemic stroke of frontal lobe (HCC) 06/28/2016  . Thrombocytopenia (HCC) 06/28/2016  . Renal failure (ARF), acute on chronic (HCC) 06/28/2016  . Stroke  (cerebrum) (HCC) 06/28/2016  . Acute CVA (cerebrovascular accident) (HCC) 06/28/2016  . Insomnia 02/02/2016  . Atrial fibrillation (HCC) 10/27/2015  . Uncoordinated movements 02/24/2014  . Unspecified hereditary and idiopathic peripheral neuropathy 02/24/2013  . Low back pain 02/24/2013  . Abnormality of gait 02/09/2013  . Memory loss, short term 12/05/2011  . LUMBAR STRAIN, ACUTE 05/05/2010  .  OSA (obstructive sleep apnea) 09/14/2009  . BPH (benign prostatic hyperplasia) 07/27/2009  . CARDIOVASCULAR STUDIES, ABNORMAL 07/27/2009  . CHEST PAIN 06/08/2009  . OSTEOARTHRITIS 05/30/2009  . HEEL PAIN, LEFT 05/24/2009  . Dyspnea on exertion 05/24/2009  . ADJUSTMENT DISORDER WITH MIXED FEATURES 08/25/2008  . Diabetes type 2, uncontrolled (HCC) 02/24/2008  . ERECTILE DYSFUNCTION 02/10/2007  . Hyperlipidemia 10/09/2006  . OBESITY 10/09/2006  . Essential hypertension 10/09/2006  . Coronary atherosclerosis 10/09/2006  . GERD 10/09/2006  . DM (diabetes mellitus) type II uncontrolled with retinopathy 06/10/2006    Joanmarie Tsang P, PTA 08/08/2016, 2:44 PM  [name] [address] [place], [place], [address] Phone: [phone]   Fax:  [phone]  Name: Doretha ImusDonald W Johannsen MRN: [id number] Date of Birth: October 19, 1945

## 2016-08-09 ENCOUNTER — Ambulatory Visit (INDEPENDENT_AMBULATORY_CARE_PROVIDER_SITE_OTHER): Payer: Medicare Other | Admitting: Adult Health

## 2016-08-09 ENCOUNTER — Telehealth: Payer: Self-pay | Admitting: *Deleted

## 2016-08-09 VITALS — BP 152/74 | Temp 98.1°F | Ht 74.0 in | Wt 223.0 lb

## 2016-08-09 DIAGNOSIS — Z760 Encounter for issue of repeat prescription: Secondary | ICD-10-CM

## 2016-08-09 DIAGNOSIS — F339 Major depressive disorder, recurrent, unspecified: Secondary | ICD-10-CM

## 2016-08-09 DIAGNOSIS — F5101 Primary insomnia: Secondary | ICD-10-CM | POA: Diagnosis not present

## 2016-08-09 DIAGNOSIS — I639 Cerebral infarction, unspecified: Secondary | ICD-10-CM | POA: Diagnosis not present

## 2016-08-09 MED ORDER — APIXABAN 5 MG PO TABS: 5.0000 mg | ORAL_TABLET | Freq: Two times a day (BID) | ORAL | 3 refills | Status: AC

## 2016-08-09 MED ORDER — ISOSORBIDE MONONITRATE ER 30 MG PO TB24: 30.0000 mg | ORAL_TABLET | Freq: Every day | ORAL | 3 refills | Status: AC

## 2016-08-09 MED ORDER — VITAMIN B-12 1000 MCG PO TABS: 2000.0000 ug | ORAL_TABLET | Freq: Every day | ORAL | 3 refills | Status: AC

## 2016-08-09 MED ORDER — THIAMINE HCL 100 MG PO TABS: 100.0000 mg | ORAL_TABLET | Freq: Every day | ORAL | 3 refills | Status: AC

## 2016-08-09 NOTE — Progress Notes (Signed)
Subjective:    Patient ID: Luis Yu, male    DOB: 09/22/1945, 71 y.o.   MRN: [id number]  HPI  71 year old male who  has a past medical history of Adjustment disorder with mixed emotional features; Atrial flutter (HCC); CAD (coronary artery disease); Depression; Diabetes mellitus type II, uncontrolled (HCC); Diabetic peripheral neuropathy (HCC); Erectile dysfunction; GERD (gastroesophageal reflux disease); Herpes zoster; Hyperlipidemia; Hypertension; Obesity; Osteoarthritis; Postherpetic neuralgia; and Sleep apnea.  He presents today for follow up regarding depression and insomnia. During the last visit Celexa was d/c over a slow taper and he was started on Remeron 15 mg.   Today in the office he reports that " I feel like my depression is improving". His wife reports that this is the first week that she notices changes in his attitude for the better.   He has been able to put on weight since starting Remeron, but with this his blood sugars have been increasing and they have been having to use more insulin.   He continues to sleep during the day but has a hard time sleeping at night.   He continues to not use the walker at home, even after being asked to multiple times.   Lab Results  Component Value Date   HGBA1C 7.6 (H) 06/28/2016    Wt Readings from Last 3 Encounters:  08/09/16 223 lb (101.2 kg)  07/24/16 222 lb (100.7 kg)  07/17/16 218 lb 14.4 oz (99.3 kg)    Review of Systems  Constitutional: Negative.   HENT: Positive for hearing loss.   Respiratory: Negative.   Cardiovascular: Negative.   Skin: Negative.   Neurological: Negative.   Psychiatric/Behavioral: Positive for agitation and sleep disturbance.  All other systems reviewed and are negative.      Objective:   Physical Exam  Constitutional: He is oriented to person, place, and time. He appears well-developed and well-nourished. No distress.  Cardiovascular: Normal rate, regular rhythm, normal heart sounds  and intact distal pulses.  Exam reveals no gallop and no friction rub.   No murmur heard. Pulmonary/Chest: Effort normal and breath sounds normal. No respiratory distress. He has no wheezes. He has no rales. He exhibits no tenderness.  Musculoskeletal:  Walking with walker with slow steady gait   Neurological: He is alert and oriented to person, place, and time.  Skin: Skin is warm and dry. No rash noted. He is not diaphoretic. No erythema. No pallor.  Psychiatric: He has a normal mood and affect. His behavior is normal. Judgment and thought content normal.  Nursing note and vitals reviewed.     Assessment & Plan:  1. Primary insomnia - Advised that he needs to stop sleeping at day and only sleep at night.  - Continue with Remeron. I am going to have them increase Remeron to 1.5 pills at night  - Follow up in 2 weeks   2. Depression, recurrent (HCC) - Appears to be improving.  - Continue with Remeron   3. Medication refill  - vitamin B-12 (CYANOCOBALAMIN) 1000 MCG tablet; Take 2 tablets (2,000 mcg total) by mouth daily.  Dispense: 180 tablet; Refill: 3 - isosorbide mononitrate (IMDUR) 30 MG 24 hr tablet; Take 1 tablet (30 mg total) by mouth daily.  Dispense: 90 tablet; Refill: 3 - thiamine 100 MG tablet; Take 1 tablet (100 mg total) by mouth daily.  Dispense: 90 tablet; Refill: 3 - apixaban (ELIQUIS) 5 MG TABS tablet; Take 1 tablet (5 mg total) by mouth 2 (two)  times daily.  Dispense: 180 tablet; Refill: 3  Shirline Freesory Kaelyn Innocent, NP

## 2016-08-09 NOTE — Telephone Encounter (Signed)
Left message for patient stating the diabetic shoe paperwork was denied due to signature from nurse practitioner.  Explained that medicare requires at least 1 visit per year with MD for the diabetic shoe program.  If he would like to obtain diabetic shoes through us he will need to make an appt with the MD for the signatures.

## 2016-08-09 NOTE — Addendum Note (Signed)
Addended by: Nancy FetterNAFZIGER, Masako Overall L on: 08/09/2016 03:58 PM   Modules accepted: Level of Service

## 2016-08-10 ENCOUNTER — Ambulatory Visit: Payer: Medicare Other | Admitting: *Deleted

## 2016-08-10 ENCOUNTER — Telehealth: Payer: Self-pay | Admitting: Podiatry

## 2016-08-10 ENCOUNTER — Telehealth: Payer: Self-pay | Admitting: Adult Health

## 2016-08-10 DIAGNOSIS — M6281 Muscle weakness (generalized): Secondary | ICD-10-CM | POA: Diagnosis not present

## 2016-08-10 DIAGNOSIS — R2681 Unsteadiness on feet: Secondary | ICD-10-CM

## 2016-08-10 DIAGNOSIS — I69314 Frontal lobe and executive function deficit following cerebral infarction: Secondary | ICD-10-CM

## 2016-08-10 NOTE — Telephone Encounter (Signed)
pts pcp called and pt was just seen and told them we were waiting on paperwork from that office for Diabetic shoes. Per NP Otis Peakory Nafzinger's office they never got the paperwork.Can you refax it please to [phone].

## 2016-08-10 NOTE — Telephone Encounter (Signed)
pts wife calling wanting you to give her a call back.  In reference to pt going to the [facility].

## 2016-08-10 NOTE — Therapy (Signed)
[name] [address] [place], [place], [address] Phone: [phone]   Fax:  [phone]  Physical Therapy Treatment  Patient Details  Name: Luis ImusDonald W Yu MRN: [id number] Date of Birth: 10-Mar-1946 Referring Provider: Claudette LawsAndrew Kirsteins, MD  Encounter Date: 08/10/2016      PT End of Session - 08/10/16 1159    Visit Number 13   Number of Visits 16   Date for PT Re-Evaluation 09/08/16   PT Start Time 1115   PT Stop Time 1205   PT Time Calculation (min) 50 min      Past Medical History:  Diagnosis Date  . Adjustment disorder with mixed emotional features   . Atrial flutter (HCC)   . CAD (coronary artery disease)   . Depression   . Diabetes mellitus type II, uncontrolled (HCC)   . Diabetic peripheral neuropathy (HCC)   . Erectile dysfunction   . GERD (gastroesophageal reflux disease)   . Herpes zoster   . Hyperlipidemia   . Hypertension   . Obesity   . Osteoarthritis   . Postherpetic neuralgia   . Sleep apnea     Past Surgical History:  Procedure Laterality Date  . CARDIOVERSION N/A 11/10/2015   Procedure: CARDIOVERSION;  Surgeon: Laurey Moralealton S McLean, MD;  [facility];  Service: Cardiovascular;  Laterality: N/A;  . CARDIOVERSION N/A 02/21/2016   Procedure: CARDIOVERSION;  Surgeon: Lewayne BuntingBrian S Crenshaw, MD;  [facility];  Service: Cardiovascular;  Laterality: N/A;  . CORONARY ARTERY BYPASS GRAFT  1996   eith a LIMA to the LAD, saphenous vein graft to the acute marginal, saphenous vein graft to the PDA and saphenous vein graft to the circumflex.  [name]. KNEE ARTHROSCOPY  05/2004   right knee  . REPLACEMENT TOTAL KNEE BILATERAL    . SHOULDER SURGERY      There were no vitals filed for this visit.          [facility] - 08/10/16 0001      Berg Balance Test   Sit to Stand Able to stand  independently using hands   Standing Unsupported Able to stand 2 minutes with supervision   Sitting with Back  Unsupported but Feet Supported on Floor or Stool Able to sit safely and securely 2 minutes   Stand to Sit Sits safely with minimal use of hands   Transfers Able to transfer safely, definite need of hands   Standing Unsupported with Eyes Closed Able to stand 10 seconds safely   Standing Ubsupported with Feet Together Able to place feet together independently and stand for 1 minute with supervision   From Standing, Reach Forward with Outstretched Arm Can reach forward >12 cm safely (5")   From Standing Position, Pick up Object from Floor Unable to pick up shoe, but reaches 2-5 cm (1-2") from shoe and balances independently   From Standing Position, Turn to Look Behind Over each Shoulder Turn sideways only but maintains balance   Turn 360 Degrees Able to turn 360 degrees safely but slowly   Standing Unsupported, Alternately Place Feet on Step/Stool Able to complete >2 steps/needs minimal assist   Standing Unsupported, One Foot in Front Able to take small step independently and hold 30 seconds   Standing on One Leg Unable to try or needs assist to prevent fall   Total Score 36                     [facility] Adult PT Treatment/Exercise - 08/10/16  0001      Knee/Hip Exercises: Aerobic   Nustep L5 UE/LE activity     Knee/Hip Exercises: Seated   Sit to Sand 10 reps;with UE support                  PT Short Term Goals - 08/10/16 1201      PT SHORT TERM GOAL #2   Title Increase Berg score to 32/56.   Time 4   Period Weeks   Status Achieved           PT Long Term Goals - 07/16/16 0930      PT LONG TERM GOAL #1   Title Independent with an advanced HEP.   Time 8   Period Weeks   Status On-going     PT LONG TERM GOAL #2   Title Increase bilateral LE strength to a solid 4+/5 to increase stability for functional activites.   Time 8   Period Weeks   Status On-going     PT LONG TERM GOAL #3   Title Walk in clinic with FWW 500 feet with supervision only  without resting and 02 not going below 93%.   Time 8   Period Weeks   Status On-going     PT LONG TERM GOAL #4   Title Perform a reciprocating stair gait with one railing.   Time 8   Period Weeks   Status On-going     PT LONG TERM GOAL #5   Title Increase Berg score to 44-45/56.   Time 8   Period Weeks   Status On-going               Plan - 08/10/16 1203    Clinical Impression Statement Pt arrived today in good spirits and was able to perform therex and Berg balance test without getting aggravated. He was able to improve his Berg test score from 27 to 36 and meet All STGs now. Unable to meet LTG due to balance deficit. SLS score was not improved and was still  the most challenging.   Clinical Impairments Affecting Rehab Potential Poor balance.  H/o CVA.   PT Frequency 2x / week   PT Duration 8 weeks   PT Treatment/Interventions ADLs/Self Care Home Management;Functional mobility training;Stair training;Gait training;Therapeutic activities;Therapeutic exercise;Balance training;Neuromuscular re-education;Patient/family education   PT Next Visit Plan Please monitor BP; HR and 02 sat.  Bilateral LE strengthening to include core exercises.  Gait and balance activites.   Consulted and Agree with Plan of Care Patient      Patient will benefit from skilled therapeutic intervention in order to improve the following deficits and impairments:  Abnormal gait, Decreased activity tolerance, Decreased balance, Decreased mobility, Decreased coordination, Decreased strength, Difficulty walking  Visit Diagnosis: Muscle weakness (generalized)  Unsteadiness on feet  Frontal lobe and executive function deficit following cerebral infarction     Problem List Patient Active Problem List   Diagnosis Date Noted  . Frontal lobe and executive function deficit following cerebral infarction 07/02/2016  . Neurologic gait disorder   . Coronary artery disease involving coronary bypass graft of  native heart without angina pectoris   . Benign essential HTN   . Acute kidney injury (HCC)   . Nausea & vomiting 06/28/2016  . Ischemic stroke of frontal lobe (HCC) 06/28/2016  . Thrombocytopenia (HCC) 06/28/2016  . Renal failure (ARF), acute on chronic (HCC) 06/28/2016  . Stroke (cerebrum) (HCC) 06/28/2016  . Acute CVA (cerebrovascular accident) (HCC) 06/28/2016  .  Insomnia 02/02/2016  . Atrial fibrillation (HCC) 10/27/2015  . Uncoordinated movements 02/24/2014  . Unspecified hereditary and idiopathic peripheral neuropathy 02/24/2013  . Low back pain 02/24/2013  . Abnormality of gait 02/09/2013  . Memory loss, short term 12/05/2011  . LUMBAR STRAIN, ACUTE 05/05/2010  . OSA (obstructive sleep apnea) 09/14/2009  . BPH (benign prostatic hyperplasia) 07/27/2009  . CARDIOVASCULAR STUDIES, ABNORMAL 07/27/2009  . CHEST PAIN 06/08/2009  . OSTEOARTHRITIS 05/30/2009  . HEEL PAIN, LEFT 05/24/2009  . Dyspnea on exertion 05/24/2009  . ADJUSTMENT DISORDER WITH MIXED FEATURES 08/25/2008  . Diabetes type 2, uncontrolled (HCC) 02/24/2008  . ERECTILE DYSFUNCTION 02/10/2007  . Hyperlipidemia 10/09/2006  . OBESITY 10/09/2006  . Essential hypertension 10/09/2006  . Coronary atherosclerosis 10/09/2006  . GERD 10/09/2006  . DM (diabetes mellitus) type II uncontrolled with retinopathy 06/10/2006    RAMSEUR,CHRIS, PTA 08/10/2016, 12:11 PM  [facility] [address] [place], [place], [address] Phone: [phone]   Fax:  [phone]  Name: CAROLYN MANISCALCO MRN: [id number] Date of Birth: 1946-03-12

## 2016-08-10 NOTE — Telephone Encounter (Signed)
I attempted to contact patient, but phone line kept ringing. I will try again later.

## 2016-08-14 ENCOUNTER — Ambulatory Visit: Payer: Medicare Other | Admitting: *Deleted

## 2016-08-14 DIAGNOSIS — I69314 Frontal lobe and executive function deficit following cerebral infarction: Secondary | ICD-10-CM | POA: Diagnosis not present

## 2016-08-14 DIAGNOSIS — M6281 Muscle weakness (generalized): Secondary | ICD-10-CM

## 2016-08-14 DIAGNOSIS — R2681 Unsteadiness on feet: Secondary | ICD-10-CM | POA: Diagnosis not present

## 2016-08-14 NOTE — Telephone Encounter (Signed)
Spoke with Nehemiah SettleBrooke at  [facility] informed her that we have received signed paperwork and that it was denied.  Explained that Medicare requires at least 1 office visit annually with an MD for the diabetic exam, they will not accept a NP or PA signature.  She asked if I could fax the paperwork again and the supervising MD could sign.  I explained that that would not work because the resulting chart note would still show that last office visit was with the NP again resulting in a denial.  She stated that she would discuss this with the provider and decide how to proceed on their end.     Phone call from me on 08/09/16 explained this to the patient before they called their PCP again.

## 2016-08-14 NOTE — Therapy (Signed)
[name] [address] [place], [place], [address] Phone: [phone]   Fax:  [phone]  Physical Therapy Treatment  Patient Details  Name: Luis Yu MRN: [id number] Date of Birth: 1945-03-24 Referring Provider: Claudette Laws, MD  Encounter Date: 08/14/2016      PT End of Session - 08/14/16 1728    Visit Number 14   Number of Visits 16   Date for PT Re-Evaluation 09/08/16   PT Start Time 1345   PT Stop Time 1435   PT Time Calculation (min) 50 min      Past Medical History:  Diagnosis Date  . Adjustment disorder with mixed emotional features   . Atrial flutter (HCC)   . CAD (coronary artery disease)   . Depression   . Diabetes mellitus type II, uncontrolled (HCC)   . Diabetic peripheral neuropathy (HCC)   . Erectile dysfunction   . GERD (gastroesophageal reflux disease)   . Herpes zoster   . Hyperlipidemia   . Hypertension   . Obesity   . Osteoarthritis   . Postherpetic neuralgia   . Sleep apnea     Past Surgical History:  Procedure Laterality Date  . CARDIOVERSION N/A 11/10/2015   Procedure: CARDIOVERSION;  Surgeon: Laurey Morale, MD;  Location: [facility] ENDOSCOPY;  Service: Cardiovascular;  Laterality: N/A;  . CARDIOVERSION N/A 02/21/2016   Procedure: CARDIOVERSION;  Surgeon: Lewayne Bunting, MD;  Location: [facility] ENDOSCOPY;  Service: Cardiovascular;  Laterality: N/A;  . CORONARY ARTERY BYPASS GRAFT  1996   eith a LIMA to the LAD, saphenous vein graft to the acute marginal, saphenous vein graft to the PDA and saphenous vein graft to the circumflex.  [name] KNEE ARTHROSCOPY  05/2004   right knee  . REPLACEMENT TOTAL KNEE BILATERAL    . SHOULDER SURGERY      There were no vitals filed for this visit.      Subjective Assessment - 08/14/16 1405    Subjective Doing ok today, but shuffling more   Patient is accompained by: Family member   Pertinent History Recent falls.  Peripheral neuropathy. Chronic atrial fib.; bil  TKA's.   How long can you walk comfortably? Short distances with a FWW.   Diagnostic tests MRI; CT   Currently in Pain? No/denies                         [facility] Adult PT Treatment/Exercise - 08/14/16 0001      Knee/Hip Exercises: Aerobic   Nustep 17 min L6 UE/LE activity     Knee/Hip Exercises: Machines for Strengthening   Cybex Knee Extension 20# x 3 minutes.   Cybex Knee Flexion 40# x 3 minutes.     Knee/Hip Exercises: Standing   Gait Training Gait in clinic with CGA with focus on RT  foot clearance  to decrease toe drag   Other Standing Knee Exercises standing balance on inverted BOSU with CGA x 5 mins     Knee/Hip Exercises: Seated   Sit to Sand 10 reps;with UE support  pause  at top                   PT Short Term Goals - 08/10/16 1201      PT SHORT TERM GOAL #2   Title Increase Berg score to 32/56.   Time 4   Period Weeks   Status Achieved           PT Long Term Goals -  07/16/16 0930      PT LONG TERM GOAL #1   Title Independent with an advanced HEP.   Time 8   Period Weeks   Status On-going     PT LONG TERM GOAL #2   Title Increase bilateral LE strength to a solid 4+/5 to increase stability for functional activites.   Time 8   Period Weeks   Status On-going     PT LONG TERM GOAL #3   Title Walk in clinic with FWW 500 feet with supervision only without resting and 02 not going below 93%.   Time 8   Period Weeks   Status On-going     PT LONG TERM GOAL #4   Title Perform a reciprocating stair gait with one railing.   Time 8   Period Weeks   Status On-going     PT LONG TERM GOAL #5   Title Increase Berg score to 44-45/56.   Time 8   Period Weeks   Status On-going               Plan - 08/14/16 1720    Clinical Impression Statement Pt arrived today doing fairly well and in good spirits. Pt had good vitals at the beginning and throughout Rx with O2 B/W 95-97 and HR 80-90 BPM. His wife reports that he has been  ambulating in the house without a walker and it scares her due to RT foot dragging. We practiced gait in clinic with CGA and Pt needed VCs to  focus on lifting RT foot up to prevent  toe drag and causing him to fall. He was able to clear toes at times, but I advised Pt to continue using his walker at this time due to balance deficits   Clinical Impairments Affecting Rehab Potential Poor balance.  H/o CVA.   PT Frequency 2x / week   PT Duration 8 weeks   PT Treatment/Interventions ADLs/Self Care Home Management;Functional mobility training;Stair training;Gait training;Therapeutic activities;Therapeutic exercise;Balance training;Neuromuscular re-education;Patient/family education   PT Next Visit Plan Please monitor BP; HR and 02 sat.  Bilateral LE strengthening to include core exercises.  Gait and balance activites.   Recert    Consulted and Agree with Plan of Care Patient      Patient will benefit from skilled therapeutic intervention in order to improve the following deficits and impairments:  Abnormal gait, Decreased activity tolerance, Decreased balance, Decreased mobility, Decreased coordination, Decreased strength, Difficulty walking  Visit Diagnosis: Muscle weakness (generalized)  Unsteadiness on feet     Problem List Patient Active Problem List   Diagnosis Date Noted  . Frontal lobe and executive function deficit following cerebral infarction 07/02/2016  . Neurologic gait disorder   . Coronary artery disease involving coronary bypass graft of native heart without angina pectoris   . Benign essential HTN   . Acute kidney injury (HCC)   . Nausea & vomiting 06/28/2016  . Ischemic stroke of frontal lobe (HCC) 06/28/2016  . Thrombocytopenia (HCC) 06/28/2016  . Renal failure (ARF), acute on chronic (HCC) 06/28/2016  . Stroke (cerebrum) (HCC) 06/28/2016  . Acute CVA (cerebrovascular accident) (HCC) 06/28/2016  . Insomnia 02/02/2016  . Atrial fibrillation (HCC) 10/27/2015  .  Uncoordinated movements 02/24/2014  . Unspecified hereditary and idiopathic peripheral neuropathy 02/24/2013  . Low back pain 02/24/2013  . Abnormality of gait 02/09/2013  . Memory loss, short term 12/05/2011  . LUMBAR STRAIN, ACUTE 05/05/2010  . OSA (obstructive sleep apnea) 09/14/2009  . BPH (benign prostatic hyperplasia)  07/27/2009  . CARDIOVASCULAR STUDIES, ABNORMAL 07/27/2009  . CHEST PAIN 06/08/2009  . OSTEOARTHRITIS 05/30/2009  . HEEL PAIN, LEFT 05/24/2009  . Dyspnea on exertion 05/24/2009  . ADJUSTMENT DISORDER WITH MIXED FEATURES 08/25/2008  . Diabetes type 2, uncontrolled (HCC) 02/24/2008  . ERECTILE DYSFUNCTION 02/10/2007  . Hyperlipidemia 10/09/2006  . OBESITY 10/09/2006  . Essential hypertension 10/09/2006  . Coronary atherosclerosis 10/09/2006  . GERD 10/09/2006  . DM (diabetes mellitus) type II uncontrolled with retinopathy 06/10/2006    Durwood Dittus,CHRIS, PTA 08/14/2016, 5:29 PM  [name] [address] [place], [place], [id number] Phone: [phone]   Fax:  [phone]  Name: Luis Yu MRN: [id number] Date of Birth: [birth year]

## 2016-08-14 NOTE — Telephone Encounter (Signed)
I received a call from [facility].  I spoke with Melody from [facility] and she state's that paperwork for diabetic shoes was denied because patient needs to be seen annually by MD, and progress note for diabetic shoes needs to be completed and signed by MD.

## 2016-08-15 ENCOUNTER — Encounter: Payer: Self-pay | Admitting: Physical Therapy

## 2016-08-15 ENCOUNTER — Ambulatory Visit: Payer: Medicare Other | Admitting: Physical Therapy

## 2016-08-15 DIAGNOSIS — M6281 Muscle weakness (generalized): Secondary | ICD-10-CM | POA: Diagnosis not present

## 2016-08-15 DIAGNOSIS — R2681 Unsteadiness on feet: Secondary | ICD-10-CM

## 2016-08-15 DIAGNOSIS — I69314 Frontal lobe and executive function deficit following cerebral infarction: Secondary | ICD-10-CM | POA: Diagnosis not present

## 2016-08-15 NOTE — Telephone Encounter (Signed)
Please inform Luis Yu that insurance is now requiring that he be seen by an MD for him to get his diabetic shoes.....   I will have to speak with my supervising physician when he gets back from vacation if he would be willing to see Luis Hillonald for one time visit

## 2016-08-15 NOTE — Therapy (Signed)
[name] [address] [place], [place], [address] Phone: [phone]   Fax:  [phone]  Physical Therapy Treatment  Patient Details  Name: Luis Yu MRN: [id number] Date of Birth: 06/29/1945 Referring Provider: Claudette Laws, MD  Encounter Date: 08/15/2016      PT End of Session - 08/15/16 1352    Visit Number 15   Number of Visits 16   Date for PT Re-Evaluation 09/08/16   PT Start Time 1347   PT Stop Time 1431   PT Time Calculation (min) 44 min   Equipment Utilized During Treatment Gait belt;Other (comment)  Rollator   Activity Tolerance Patient tolerated treatment well   Behavior During Therapy WFL for tasks assessed/performed      Past Medical History:  Diagnosis Date  . Adjustment disorder with mixed emotional features   . Atrial flutter (HCC)   . CAD (coronary artery disease)   . Depression   . Diabetes mellitus type II, uncontrolled (HCC)   . Diabetic peripheral neuropathy (HCC)   . Erectile dysfunction   . GERD (gastroesophageal reflux disease)   . Herpes zoster   . Hyperlipidemia   . Hypertension   . Obesity   . Osteoarthritis   . Postherpetic neuralgia   . Sleep apnea     Past Surgical History:  Procedure Laterality Date  . CARDIOVERSION N/A 11/10/2015   Procedure: CARDIOVERSION;  Surgeon: Laurey Morale, MD;  Location: [facility] ENDOSCOPY;  Service: Cardiovascular;  Laterality: N/A;  . CARDIOVERSION N/A 02/21/2016   Procedure: CARDIOVERSION;  Surgeon: Lewayne Bunting, MD;  Location: [facility] ENDOSCOPY;  Service: Cardiovascular;  Laterality: N/A;  . CORONARY ARTERY BYPASS GRAFT  1996   eith a LIMA to the LAD, saphenous vein graft to the acute marginal, saphenous vein graft to the PDA and saphenous vein graft to the circumflex.  [name] KNEE ARTHROSCOPY  05/2004   right knee  . REPLACEMENT TOTAL KNEE BILATERAL    . SHOULDER SURGERY      There were no vitals filed for this visit.      Subjective Assessment -  08/15/16 1352    Subjective Reports that he is doing well today. Patient's wife reports that he is still dragging his RLE the worst he ever has.   Patient is accompained by: Family member  Wife   Pertinent History Recent falls.  Peripheral neuropathy. Chronic atrial fib.; bil TKA's.   How long can you walk comfortably? Short distances with a FWW.   Diagnostic tests MRI; CT   Currently in Pain? No/denies            [facility] PT Assessment - 08/15/16 0001      Assessment   Medical Diagnosis Left CVA.   Onset Date/Surgical Date 07/07/16     Precautions   Precautions Fall     Restrictions   Weight Bearing Restrictions No                     [facility] Adult PT Treatment/Exercise - 08/15/16 0001      Knee/Hip Exercises: Aerobic   Nustep L5 x15 min     Knee/Hip Exercises: Machines for Strengthening   Cybex Knee Extension 203 3x15 reps   Cybex Knee Flexion 40# 3x15 reps     Knee/Hip Exercises: Standing   Hip Flexion Stengthening;Both;2 sets;10 reps;Knee bent   Hip Flexion Limitations 3# ankleweights   Forward Step Up Both;2 sets;10 reps;Hand Hold: 2;Step Height: 6"   Other Standing Knee Exercises Attempted  deloading strengthening into RLE extension with 50# but unable to maintian foot in strap     Knee/Hip Exercises: Seated   Sit to Sand 2 sets;10 reps;with UE support  superior knee height     Knee/Hip Exercises: Supine   Straight Leg Raises AROM;Both;2 sets;10 reps             Balance Exercises - 08/15/16 1437      Balance Exercises: Standing   Tandem Stance Eyes open;Foam/compliant surface;Upper extremity support 1  x2 min each   Balance Beam Sidestance with 1 UE support x2 min   Other Standing Exercises Inverted BOSU one UE support x2 min             PT Short Term Goals - 08/10/16 1201      PT SHORT TERM GOAL #2   Title Increase Berg score to 32/56.   Time 4   Period Weeks   Status Achieved           PT Long Term Goals - 07/16/16  0930      PT LONG TERM GOAL #1   Title Independent with an advanced HEP.   Time 8   Period Weeks   Status On-going     PT LONG TERM GOAL #2   Title Increase bilateral LE strength to a solid 4+/5 to increase stability for functional activites.   Time 8   Period Weeks   Status On-going     PT LONG TERM GOAL #3   Title Walk in clinic with FWW 500 feet with supervision only without resting and 02 not going below 93%.   Time 8   Period Weeks   Status On-going     PT LONG TERM GOAL #4   Title Perform a reciprocating stair gait with one railing.   Time 8   Period Weeks   Status On-going     PT LONG TERM GOAL #5   Title Increase Berg score to 44-45/56.   Time 8   Period Weeks   Status On-going               Plan - 08/15/16 1450    Clinical Impression Statement Patient arrived to clinic today with RLE drag during gait which was reported by patient's wife. Today's focus on exercises pertained to exercises that forced hip flexion to promote better foot clearance during gait.  Resisted RLE extension for deloading was attempted today with 50# but patient unable to maintaint R foot in strap. Patient required several short seated rest breaks during today's treatment secondary to fatigue. Patient also instructed to complete balance activities on uneven/dynamic surfaces with only one UE support to which he was able to tolerate well. Patient did demonstrate instability with attempts to properly stand on bosu today.   Clinical Impairments Affecting Rehab Potential Poor balance.  H/o CVA.   PT Frequency 2x / week   PT Duration 8 weeks   PT Treatment/Interventions ADLs/Self Care Home Management;Functional mobility training;Stair training;Gait training;Therapeutic activities;Therapeutic exercise;Balance training;Neuromuscular re-education;Patient/family education   PT Next Visit Plan Please monitor BP; HR and 02 sat.  Bilateral LE strengthening to include core exercises.  Gait and balance  activites.   Recert    Consulted and Agree with Plan of Care Patient;Family member/caregiver   Family Member Consulted Wife      Patient will benefit from skilled therapeutic intervention in order to improve the following deficits and impairments:  Abnormal gait, Decreased activity tolerance, Decreased balance, Decreased mobility, Decreased coordination, Decreased  strength, Difficulty walking  Visit Diagnosis: Muscle weakness (generalized)  Unsteadiness on feet     Problem List Patient Active Problem List   Diagnosis Date Noted  . Frontal lobe and executive function deficit following cerebral infarction 07/02/2016  . Neurologic gait disorder   . Coronary artery disease involving coronary bypass graft of native heart without angina pectoris   . Benign essential HTN   . Acute kidney injury (HCC)   . Nausea & vomiting 06/28/2016  . Ischemic stroke of frontal lobe (HCC) 06/28/2016  . Thrombocytopenia (HCC) 06/28/2016  . Renal failure (ARF), acute on chronic (HCC) 06/28/2016  . Stroke (cerebrum) (HCC) 06/28/2016  . Acute CVA (cerebrovascular accident) (HCC) 06/28/2016  . Insomnia 02/02/2016  . Atrial fibrillation (HCC) 10/27/2015  . Uncoordinated movements 02/24/2014  . Unspecified hereditary and idiopathic peripheral neuropathy 02/24/2013  . Low back pain 02/24/2013  . Abnormality of gait 02/09/2013  . Memory loss, short term 12/05/2011  . LUMBAR STRAIN, ACUTE 05/05/2010  . OSA (obstructive sleep apnea) 09/14/2009  . BPH (benign prostatic hyperplasia) 07/27/2009  . CARDIOVASCULAR STUDIES, ABNORMAL 07/27/2009  . CHEST PAIN 06/08/2009  . OSTEOARTHRITIS 05/30/2009  . HEEL PAIN, LEFT 05/24/2009  . Dyspnea on exertion 05/24/2009  . ADJUSTMENT DISORDER WITH MIXED FEATURES 08/25/2008  . Diabetes type 2, uncontrolled (HCC) 02/24/2008  . ERECTILE DYSFUNCTION 02/10/2007  . Hyperlipidemia 10/09/2006  . OBESITY 10/09/2006  . Essential hypertension 10/09/2006  . Coronary  atherosclerosis 10/09/2006  . GERD 10/09/2006  . DM (diabetes mellitus) type II uncontrolled with retinopathy 06/10/2006    Evelene CroonKelsey M Parsons, PTA 08/15/2016, 2:56 PM  [name] [address] [place], [place], [address] Phone: [phone]   Fax:  [phone]  Name: Luis Yu MRN: [id number] Date of Birth: 20-Jan-1946

## 2016-08-16 NOTE — Telephone Encounter (Signed)
I left message for patient's wife to return phone call.  

## 2016-08-17 ENCOUNTER — Ambulatory Visit: Payer: Medicare Other | Admitting: *Deleted

## 2016-08-17 ENCOUNTER — Ambulatory Visit (INDEPENDENT_AMBULATORY_CARE_PROVIDER_SITE_OTHER): Payer: Medicare Other | Admitting: Adult Health

## 2016-08-17 ENCOUNTER — Encounter: Payer: Self-pay | Admitting: Adult Health

## 2016-08-17 VITALS — BP 138/65 | Ht 74.0 in | Wt 223.7 lb

## 2016-08-17 DIAGNOSIS — F5101 Primary insomnia: Secondary | ICD-10-CM | POA: Diagnosis not present

## 2016-08-17 DIAGNOSIS — F339 Major depressive disorder, recurrent, unspecified: Secondary | ICD-10-CM | POA: Diagnosis not present

## 2016-08-17 DIAGNOSIS — I639 Cerebral infarction, unspecified: Secondary | ICD-10-CM

## 2016-08-17 DIAGNOSIS — F331 Major depressive disorder, recurrent, moderate: Secondary | ICD-10-CM | POA: Diagnosis not present

## 2016-08-17 DIAGNOSIS — K047 Periapical abscess without sinus: Secondary | ICD-10-CM | POA: Diagnosis not present

## 2016-08-17 MED ORDER — PENICILLIN V POTASSIUM 500 MG PO TABS: 500.0000 mg | ORAL_TABLET | Freq: Three times a day (TID) | ORAL | 0 refills | Status: AC

## 2016-08-17 MED ORDER — MIRTAZAPINE 30 MG PO TABS: 30.0000 mg | ORAL_TABLET | Freq: Every day | ORAL | 3 refills | Status: DC

## 2016-08-17 NOTE — Progress Notes (Signed)
Subjective:    Patient ID: Luis Yu, male    DOB: [birth year], 71 y.o.   MRN: [id number]  HPI  71 year old male who  has a past medical history of Adjustment disorder with mixed emotional features; Atrial flutter (HCC); CAD (coronary artery disease); Depression; Diabetes mellitus type II, uncontrolled (HCC); Diabetic peripheral neuropathy (HCC); Erectile dysfunction; GERD (gastroesophageal reflux disease); Herpes zoster; Hyperlipidemia; Hypertension; Obesity; Osteoarthritis; Postherpetic neuralgia; and Sleep apnea.   He presents to the office today for the acute issue of mouth pain and swelling. He reports that over the last 24 hours he has mouth pain and feels as though the mouth is swelling. He has pain with chewing.   He has multiple missing teeth and dental caries. Unfortunately, he is on Eliquis and from what patient reports he cannot come off it for dental work.   He denies any fevers   He also needs a refill of Remeron. He is not sleeping any better and feels as though his depression is stable.    Review of Systems See HPI   Past Medical History:  Diagnosis Date  . Adjustment disorder with mixed emotional features   . Atrial flutter (HCC)   . CAD (coronary artery disease)   . Depression   . Diabetes mellitus type II, uncontrolled (HCC)   . Diabetic peripheral neuropathy (HCC)   . Erectile dysfunction   . GERD (gastroesophageal reflux disease)   . Herpes zoster   . Hyperlipidemia   . Hypertension   . Obesity   . Osteoarthritis   . Postherpetic neuralgia   . Sleep apnea     Social History   Social History  . Marital status: Married    Spouse name: [name]  . Number of children: 1  . Years of education: 7613   Occupational History  . Retired Naval architecttruck driver Retired   Social History Main Topics  . Smoking status: Never Smoker  . Smokeless tobacco: Never Used  . Alcohol use 1.2 oz/week    2 Standard drinks or equivalent per week     Comment: twice a month.  .  Drug use: No  . Sexual activity: Yes    Partners: Female   Other Topics Concern  . Not on file   Social History Narrative   Married [name]([name])   Regular exercise: none   Caffeine use: cup of coffee daily    Retired Naval architecttruck driver   Caffeine- twice daily             Past Surgical History:  Procedure Laterality Date  . CARDIOVERSION N/A 11/10/2015   Procedure: CARDIOVERSION;  Surgeon: Laurey Moralealton S McLean, MD;  [facility];  Service: Cardiovascular;  Laterality: N/A;  . CARDIOVERSION N/A 02/21/2016   Procedure: CARDIOVERSION;  Surgeon: Lewayne BuntingBrian S Crenshaw, MD;  [facility];  Service: Cardiovascular;  Laterality: N/A;  . CORONARY ARTERY BYPASS GRAFT  1996   eith a LIMA to the LAD, saphenous vein graft to the acute marginal, saphenous vein graft to the PDA and saphenous vein graft to the circumflex.  [name]. KNEE ARTHROSCOPY  05/2004   right knee  . REPLACEMENT TOTAL KNEE BILATERAL    . SHOULDER SURGERY      Family History  Problem Relation Age of Onset  . Coronary artery disease Mother        deceased at 3575  . Coronary artery disease Father        died age 72  . Stroke Unknown  half brother  . Stroke Brother     No Known Allergies  Current Outpatient Prescriptions on File Prior to Visit  Medication Sig Dispense Refill  . acetaminophen (TYLENOL) 500 MG tablet Take 2 tablets (1,000 mg total) by mouth every 6 (six) hours as needed. (Patient taking differently: Take 1,000 mg by mouth every 6 (six) hours as needed for mild pain. ) 30 tablet 0  . apixaban (ELIQUIS) 5 MG TABS tablet Take 1 tablet (5 mg total) by mouth 2 (two) times daily. 180 tablet 3  . aspirin EC 81 MG tablet Take 1 tablet (81 mg total) by mouth daily.    [name] atorvastatin (LIPITOR) 80 MG tablet Take 1 tablet (80 mg total) by mouth daily. 90 tablet 3  . buPROPion (WELLBUTRIN SR) 150 MG 12 hr tablet Take 1 tablet (150 mg total) by mouth 2 (two) times daily. 180 tablet 1  . glipiZIDE (GLUCOTROL) 10 MG  tablet Take 10 mg by mouth daily.    [name] glucose blood (ACCU-CHEK ACTIVE STRIPS) test strip Use as instructed 100 each 12  . insulin aspart (NOVOLOG) 100 UNIT/ML injection Correction coverage: Sensitive (thin, NPO, renal)  CBG < 70: implement hypoglycemia protocol  CBG 70 - 120: 0 units  CBG 121 - 150: 1 unit  CBG 151 - 200: 2 units  CBG 201 - 250: 3 units  CBG 251 - 300: 5 units  CBG 301 - 350: 7 units  CBG 351 - 400 9 units  CBG > 400 call MD and obtain STAT lab verification 10 mL 11  . Insulin Glargine (BASAGLAR KWIKPEN) 100 UNIT/ML SOPN Inject 0.25 mLs (25 Units total) into the skin at bedtime. (Patient taking differently: Inject 20 Units into the skin at bedtime. ) 3 pen 11  . Insulin Pen Needle (PEN NEEDLES 31GX5/16") 31G X 8 MM MISC Use to inject insulin 5 times a day. 100 each 11  . isosorbide mononitrate (IMDUR) 30 MG 24 hr tablet Take 1 tablet (30 mg total) by mouth daily. 90 tablet 3  . Lancets (ACCU-CHEK MULTICLIX) lancets Use up to 5 times a day to check blood sugar. 100 each 5  . metFORMIN (GLUCOPHAGE) 500 MG tablet Take 1 tablet (500 mg total) by mouth 2 (two) times daily with a meal. 60 tablet 1  . nebivolol 20 MG TABS Take 1 tablet (20 mg total) by mouth daily. 30 tablet 1  . omeprazole (PRILOSEC) 20 MG capsule TAKE ONE CAPSULE TWICE A DAY BEFORE A MEAL 180 capsule 0  . thiamine 100 MG tablet Take 1 tablet (100 mg total) by mouth daily. 90 tablet 3  . vitamin B-12 (CYANOCOBALAMIN) 1000 MCG tablet Take 2 tablets (2,000 mcg total) by mouth daily. 180 tablet 3   No current facility-administered medications on file prior to visit.     BP 138/65 (BP Location: Left Arm, Patient Position: Sitting, Cuff Size: Normal)   Ht 6\' 2"  (1.88 m)   Wt 223 lb 11.2 oz (101.5 kg)   BMI 28.72 kg/m       Objective:   Physical Exam  Constitutional: He is oriented to person, place, and time. He appears well-developed and well-nourished. No distress.  HENT:  Mouth/Throat: Oropharynx is  clear and moist and mucous membranes are normal. Abnormal dentition. Dental abscesses and dental caries present. No uvula swelling.  No swelling noted    Neurological: He is alert and oriented to person, place, and time.  Skin: Skin is warm and dry. He is not  diaphoretic. No erythema.  Psychiatric: He has a normal mood and affect. His behavior is normal. Judgment and thought content normal.  Nursing note and vitals reviewed.     Assessment & Plan:  1. Dental abscess - penicillin v potassium (VEETID) 500 MG tablet; Take 1 tablet (500 mg total) by mouth 3 (three) times daily.  Dispense: 30 tablet; Refill: 0  2. Moderate episode of recurrent major depressive disorder (HCC) - Increase from 1.5 tablets to 30 mg  - mirtazapine (REMERON) 30 MG tablet; Take 1 tablet (30 mg total) by mouth at bedtime.  Dispense: 30 tablet; Refill: 3  3. Primary insomnia  - mirtazapine (REMERON) 30 MG tablet; Take 1 tablet (30 mg total) by mouth at bedtime.  Dispense: 30 tablet; Refill: 3  Shirline Frees, NP

## 2016-08-17 NOTE — Telephone Encounter (Signed)
Patient has appt in office today - will discuss then. Thanks!

## 2016-08-20 ENCOUNTER — Encounter: Payer: Self-pay | Admitting: Physical Therapy

## 2016-08-20 ENCOUNTER — Ambulatory Visit: Payer: Medicare Other | Attending: Physical Medicine & Rehabilitation | Admitting: Physical Therapy

## 2016-08-20 DIAGNOSIS — Z760 Encounter for issue of repeat prescription: Secondary | ICD-10-CM | POA: Insufficient documentation

## 2016-08-20 DIAGNOSIS — F339 Major depressive disorder, recurrent, unspecified: Secondary | ICD-10-CM | POA: Insufficient documentation

## 2016-08-20 DIAGNOSIS — R260 Ataxic gait: Secondary | ICD-10-CM | POA: Diagnosis not present

## 2016-08-20 DIAGNOSIS — R2681 Unsteadiness on feet: Secondary | ICD-10-CM | POA: Insufficient documentation

## 2016-08-20 DIAGNOSIS — I69314 Frontal lobe and executive function deficit following cerebral infarction: Secondary | ICD-10-CM | POA: Diagnosis not present

## 2016-08-20 DIAGNOSIS — M6281 Muscle weakness (generalized): Secondary | ICD-10-CM

## 2016-08-20 DIAGNOSIS — R2689 Other abnormalities of gait and mobility: Secondary | ICD-10-CM | POA: Diagnosis not present

## 2016-08-20 NOTE — Addendum Note (Signed)
Addended by: Varina Hulon, [place] W on: 08/20/2016 03:06 PM   Modules accepted: Orders

## 2016-08-20 NOTE — Therapy (Signed)
[name] [address] [place], [place], [address] Phone: [phone]   Fax:  [phone]  Physical Therapy Treatment  Patient Details  Name: Luis Yu MRN: [id number] Date of Birth: 1945/08/23 Referring Provider: Claudette Laws, MD  Encounter Date: 08/20/2016      PT End of Session - 08/20/16 1431    Visit Number 16   Date for PT Re-Evaluation 09/08/16   PT Start Time 1400   PT Stop Time 1442   PT Time Calculation (min) 42 min   Equipment Utilized During Treatment Gait belt   Activity Tolerance Patient tolerated treatment well   Behavior During Therapy [facility] for tasks assessed/performed      Past Medical History:  Diagnosis Date  . Adjustment disorder with mixed emotional features   . Atrial flutter (HCC)   . CAD (coronary artery disease)   . Depression   . Diabetes mellitus type II, uncontrolled (HCC)   . Diabetic peripheral neuropathy (HCC)   . Erectile dysfunction   . GERD (gastroesophageal reflux disease)   . Herpes zoster   . Hyperlipidemia   . Hypertension   . Obesity   . Osteoarthritis   . Postherpetic neuralgia   . Sleep apnea     Past Surgical History:  Procedure Laterality Date  . CARDIOVERSION N/A 11/10/2015   Procedure: CARDIOVERSION;  Surgeon: Laurey Morale, MD;  [facility] ENDOSCOPY;  Service: Cardiovascular;  Laterality: N/A;  . CARDIOVERSION N/A 02/21/2016   Procedure: CARDIOVERSION;  Surgeon: Lewayne Bunting, MD;  Location: [facility] ENDOSCOPY;  Service: Cardiovascular;  Laterality: N/A;  . CORONARY ARTERY BYPASS GRAFT  1996   eith a LIMA to the LAD, saphenous vein graft to the acute marginal, saphenous vein graft to the PDA and saphenous vein graft to the circumflex.  [name] KNEE ARTHROSCOPY  05/2004   right knee  . REPLACEMENT TOTAL KNEE BILATERAL    . SHOULDER SURGERY      There were no vitals filed for this visit.      Subjective Assessment - 08/20/16 1414    Subjective Patient reported  feeling really good today and no falls reported   Patient is accompained by: Family member   Pertinent History Recent falls.  Peripheral neuropathy. Chronic atrial fib.; bil TKA's.   How long can you walk comfortably? Short distances with a FWW.   Diagnostic tests MRI; CT   Currently in Pain? No/denies                         [facility] Adult PT Treatment/Exercise - 08/20/16 0001      Knee/Hip Exercises: Aerobic   Nustep L5 x15 min UE/LE activity     Knee/Hip Exercises: Machines for Strengthening   Cybex Knee Extension 20# 3x15   Cybex Knee Flexion 40# 3x15     Knee/Hip Exercises: Standing   Heel Raises Both;2 sets;10 reps   Knee Flexion Strengthening;Both;2 sets;10 reps     Knee/Hip Exercises: Seated   Sit to Sand 2 sets;10 reps;with UE support  high mat table     Knee/Hip Exercises: Supine   Straight Leg Raises AROM;Both;2 sets;10 reps   Other Supine Knee/Hip Exercises bridge 2x10                  PT Short Term Goals - 08/10/16 1201      PT SHORT TERM GOAL #2   Title Increase Berg score to 32/56.   Time 4   Period Weeks  Status Achieved           PT Long Term Goals - 07/16/16 0930      PT LONG TERM GOAL #1   Title Independent with an advanced HEP.   Time 8   Period Weeks   Status On-going     PT LONG TERM GOAL #2   Title Increase bilateral LE strength to a solid 4+/5 to increase stability for functional activites.   Time 8   Period Weeks   Status On-going     PT LONG TERM GOAL #3   Title Walk in clinic with FWW 500 feet with supervision only without resting and 02 not going below 93%.   Time 8   Period Weeks   Status On-going     PT LONG TERM GOAL #4   Title Perform a reciprocating stair gait with one railing.   Time 8   Period Weeks   Status On-going     PT LONG TERM GOAL #5   Title Increase Berg score to 44-45/56.   Time 8   Period Weeks   Status On-going               Plan - 08/20/16 1432    Clinical  Impression Statement Patient tolerated treatment well today. Patient able to complete all exercises with minimal rest breaks required.Patient progressing with strengthening and balance activities with minimal LOB. CGA throughout for safety. Patient progressing toward goals yet ongoing due to strength and balance deficts. Vitals WNL today.   Clinical Impairments Affecting Rehab Potential Poor balance.  H/o CVA.   PT Frequency 2x / week   PT Duration 8 weeks   PT Treatment/Interventions ADLs/Self Care Home Management;Functional mobility training;Stair training;Gait training;Therapeutic activities;Therapeutic exercise;Balance training;Neuromuscular re-education;Patient/family education   PT Next Visit Plan Please monitor BP; HR and 02 sat.  Bilateral LE strengthening to include core exercises.  Gait and balance activites.      Consulted and Agree with Plan of Care Patient      Patient will benefit from skilled therapeutic intervention in order to improve the following deficits and impairments:  Abnormal gait, Decreased activity tolerance, Decreased balance, Decreased mobility, Decreased coordination, Decreased strength, Difficulty walking  Visit Diagnosis: Muscle weakness (generalized)  Unsteadiness on feet     Problem List Patient Active Problem List   Diagnosis Date Noted  . Frontal lobe and executive function deficit following cerebral infarction 07/02/2016  . Neurologic gait disorder   . Coronary artery disease involving coronary bypass graft of native heart without angina pectoris   . Benign essential HTN   . Acute kidney injury (HCC)   . Nausea & vomiting 06/28/2016  . Ischemic stroke of frontal lobe (HCC) 06/28/2016  . Thrombocytopenia (HCC) 06/28/2016  . Renal failure (ARF), acute on chronic (HCC) 06/28/2016  . Stroke (cerebrum) (HCC) 06/28/2016  . Acute CVA (cerebrovascular accident) (HCC) 06/28/2016  . Insomnia 02/02/2016  . Atrial fibrillation (HCC) 10/27/2015  .  Uncoordinated movements 02/24/2014  . Unspecified hereditary and idiopathic peripheral neuropathy 02/24/2013  . Low back pain 02/24/2013  . Abnormality of gait 02/09/2013  . Memory loss, short term 12/05/2011  . LUMBAR STRAIN, ACUTE 05/05/2010  . OSA (obstructive sleep apnea) 09/14/2009  . BPH (benign prostatic hyperplasia) 07/27/2009  . CARDIOVASCULAR STUDIES, ABNORMAL 07/27/2009  . CHEST PAIN 06/08/2009  . OSTEOARTHRITIS 05/30/2009  . HEEL PAIN, LEFT 05/24/2009  . Dyspnea on exertion 05/24/2009  . ADJUSTMENT DISORDER WITH MIXED FEATURES 08/25/2008  . Diabetes type 2, uncontrolled (HCC) 02/24/2008  .  ERECTILE DYSFUNCTION 02/10/2007  . Hyperlipidemia 10/09/2006  . OBESITY 10/09/2006  . Essential hypertension 10/09/2006  . Coronary atherosclerosis 10/09/2006  . GERD 10/09/2006  . DM (diabetes mellitus) type II uncontrolled with retinopathy 06/10/2006   Cathie HoopsChristina Masaru Chamberlin, PTA 08/20/16 2:47 PM  [name] [address] [place], [place], [address] Phone: [phone]   Fax:  [phone]  Name: Luis Yu MRN: [id number] Date of Birth: 1946/01/16

## 2016-08-22 ENCOUNTER — Encounter: Payer: Self-pay | Admitting: Physical Therapy

## 2016-08-22 ENCOUNTER — Ambulatory Visit: Payer: Medicare Other | Admitting: Physical Therapy

## 2016-08-22 DIAGNOSIS — M6281 Muscle weakness (generalized): Secondary | ICD-10-CM

## 2016-08-22 DIAGNOSIS — R260 Ataxic gait: Secondary | ICD-10-CM | POA: Diagnosis not present

## 2016-08-22 DIAGNOSIS — F339 Major depressive disorder, recurrent, unspecified: Secondary | ICD-10-CM | POA: Diagnosis not present

## 2016-08-22 DIAGNOSIS — R2681 Unsteadiness on feet: Secondary | ICD-10-CM | POA: Diagnosis not present

## 2016-08-22 DIAGNOSIS — Z760 Encounter for issue of repeat prescription: Secondary | ICD-10-CM | POA: Diagnosis not present

## 2016-08-22 DIAGNOSIS — I69314 Frontal lobe and executive function deficit following cerebral infarction: Secondary | ICD-10-CM | POA: Diagnosis not present

## 2016-08-22 NOTE — Therapy (Signed)
[name] [address] [place], [place], [address] Phone: [phone]   Fax:  [phone]  Physical Therapy Treatment  Patient Details  Name: Luis Yu MRN: [id number] Date of Birth: [birth year] Referring Provider: Claudette Laws, MD  Encounter Date: 08/22/2016      PT End of Session - 08/22/16 1412    Visit Number 17   Number of Visits 32   Date for PT Re-Evaluation 09/22/16   PT Start Time 1359   PT Stop Time 1442   PT Time Calculation (min) 43 min   Activity Tolerance Patient tolerated treatment well   Behavior During Therapy [facility] for tasks assessed/performed      Past Medical History:  Diagnosis Date  . Adjustment disorder with mixed emotional features   . Atrial flutter (HCC)   . CAD (coronary artery disease)   . Depression   . Diabetes mellitus type II, uncontrolled (HCC)   . Diabetic peripheral neuropathy (HCC)   . Erectile dysfunction   . GERD (gastroesophageal reflux disease)   . Herpes zoster   . Hyperlipidemia   . Hypertension   . Obesity   . Osteoarthritis   . Postherpetic neuralgia   . Sleep apnea     Past Surgical History:  Procedure Laterality Date  . CARDIOVERSION N/A 11/10/2015   Procedure: CARDIOVERSION;  Surgeon: Laurey Morale, MD;  Location: [facility] ENDOSCOPY;  Service: Cardiovascular;  Laterality: N/A;  . CARDIOVERSION N/A 02/21/2016   Procedure: CARDIOVERSION;  Surgeon: Lewayne Bunting, MD;  Location: [facility] ENDOSCOPY;  Service: Cardiovascular;  Laterality: N/A;  . CORONARY ARTERY BYPASS GRAFT  1996   eith a LIMA to the LAD, saphenous vein graft to the acute marginal, saphenous vein graft to the PDA and saphenous vein graft to the circumflex.  [name] KNEE ARTHROSCOPY  05/2004   right knee  . REPLACEMENT TOTAL KNEE BILATERAL    . SHOULDER SURGERY      There were no vitals filed for this visit.      Subjective Assessment - 08/22/16 1404    Subjective Patient has no complaints today and did good  after last treatment   Patient is accompained by: Family member   Pertinent History Recent falls.  Peripheral neuropathy. Chronic atrial fib.; bil TKA's.   How long can you walk comfortably? Short distances with a FWW.   Diagnostic tests MRI; CT   Currently in Pain? No/denies            [facility] PT Assessment - 08/22/16 0001      Berg Balance Test   Sit to Stand Able to stand  independently using hands   Standing Unsupported Able to stand 2 minutes with supervision   Sitting with Back Unsupported but Feet Supported on Floor or Stool Able to sit safely and securely 2 minutes   Stand to Sit Sits safely with minimal use of hands   Transfers Able to transfer safely, definite need of hands   Standing Unsupported with Eyes Closed Able to stand 10 seconds safely   Standing Ubsupported with Feet Together Able to place feet together independently and stand for 1 minute with supervision   From Standing, Reach Forward with Outstretched Arm Can reach forward >12 cm safely (5")   From Standing Position, Pick up Object from Floor Able to pick up shoe, needs supervision   From Standing Position, Turn to Look Behind Over each Shoulder Looks behind one side only/other side shows less weight shift   Turn 360 Degrees Able  to turn 360 degrees safely but slowly   Standing Unsupported, Alternately Place Feet on Step/Stool Able to complete >2 steps/needs minimal assist   Standing Unsupported, One Foot in Front Able to take small step independently and hold 30 seconds   Standing on One Leg Unable to try or needs assist to prevent fall   Total Score 38                     [facility] Adult PT Treatment/Exercise - 08/22/16 0001      Knee/Hip Exercises: Aerobic   Nustep L5 x15 min UE/LE activity     Knee/Hip Exercises: Machines for Strengthening   Cybex Knee Extension 20# 3x15   Cybex Knee Flexion 40# 3x15     Knee/Hip Exercises: Seated   Sit to Sand 2 sets;10 reps;with UE support  high table               Balance Exercises - 08/22/16 1428      Balance Exercises: Standing   Standing Eyes Opened Narrow base of support (BOS);Wide (BOA);Foam/compliant surface;Solid surface;Time   Standing Eyes Closed Time;Narrow base of support (BOS);Wide (BOA)   Tandem Stance 2 reps;Eyes open   SLS --  unable   Standing, One Foot on a Step Eyes closed;6 inch;4 reps   Step Ups 6 inch;Forward;UE support 1  2x10             PT Short Term Goals - 08/10/16 1201      PT SHORT TERM GOAL #2   Title Increase Berg score to 32/56.   Time 4   Period Weeks   Status Achieved           PT Long Term Goals - 07/16/16 0930      PT LONG TERM GOAL #1   Title Independent with an advanced HEP.   Time 8   Period Weeks   Status On-going     PT LONG TERM GOAL #2   Title Increase bilateral LE strength to a solid 4+/5 to increase stability for functional activites.   Time 8   Period Weeks   Status On-going     PT LONG TERM GOAL #3   Title Walk in clinic with FWW 500 feet with supervision only without resting and 02 not going below 93%.   Time 8   Period Weeks   Status On-going     PT LONG TERM GOAL #4   Title Perform a reciprocating stair gait with one railing.   Time 8   Period Weeks   Status On-going     PT LONG TERM GOAL #5   Title Increase Berg score to 44-45/56.   Time 8   Period Weeks   Status On-going               Plan - 08/22/16 1435    Clinical Impression Statement Patient tolerated treatment well overall. Patient required a few rest breaks today. Monitored vials throughout. 153/86 after highest after activity and then down to 135/80 after rest. Patient has improved BERG score to 38/56 today. Patient progressing with balance and strength exercises yet unable to meet any further goals. CGA required for safety. strength and balance deficts.    Clinical Impairments Affecting Rehab Potential Poor balance.  H/o CVA.   PT Frequency 2x / week   PT Duration 8 weeks    PT Treatment/Interventions ADLs/Self Care Home Management;Functional mobility training;Stair training;Gait training;Therapeutic activities;Therapeutic exercise;Balance training;Neuromuscular re-education;Patient/family education   PT Next Visit  Plan Please monitor BP; HR and 02 sat.  Bilateral LE strengthening to include core exercises.  Gait and balance activites.      Consulted and Agree with Plan of Care Patient      Patient will benefit from skilled therapeutic intervention in order to improve the following deficits and impairments:  Abnormal gait, Decreased activity tolerance, Decreased balance, Decreased mobility, Decreased coordination, Decreased strength, Difficulty walking  Visit Diagnosis: Muscle weakness (generalized)  Unsteadiness on feet     Problem List Patient Active Problem List   Diagnosis Date Noted  . Frontal lobe and executive function deficit following cerebral infarction 07/02/2016  . Neurologic gait disorder   . Coronary artery disease involving coronary bypass graft of native heart without angina pectoris   . Benign essential HTN   . Acute kidney injury (HCC)   . Nausea & vomiting 06/28/2016  . Ischemic stroke of frontal lobe (HCC) 06/28/2016  . Thrombocytopenia (HCC) 06/28/2016  . Renal failure (ARF), acute on chronic (HCC) 06/28/2016  . Stroke (cerebrum) (HCC) 06/28/2016  . Acute CVA (cerebrovascular accident) (HCC) 06/28/2016  . Insomnia 02/02/2016  . Atrial fibrillation (HCC) 10/27/2015  . Uncoordinated movements 02/24/2014  . Unspecified hereditary and idiopathic peripheral neuropathy 02/24/2013  . Low back pain 02/24/2013  . Abnormality of gait 02/09/2013  . Memory loss, short term 12/05/2011  . LUMBAR STRAIN, ACUTE 05/05/2010  . OSA (obstructive sleep apnea) 09/14/2009  . BPH (benign prostatic hyperplasia) 07/27/2009  . CARDIOVASCULAR STUDIES, ABNORMAL 07/27/2009  . CHEST PAIN 06/08/2009  . OSTEOARTHRITIS 05/30/2009  . HEEL PAIN, LEFT  05/24/2009  . Dyspnea on exertion 05/24/2009  . ADJUSTMENT DISORDER WITH MIXED FEATURES 08/25/2008  . Diabetes type 2, uncontrolled (HCC) 02/24/2008  . ERECTILE DYSFUNCTION 02/10/2007  . Hyperlipidemia 10/09/2006  . OBESITY 10/09/2006  . Essential hypertension 10/09/2006  . Coronary atherosclerosis 10/09/2006  . GERD 10/09/2006  . DM (diabetes mellitus) type II uncontrolled with retinopathy 06/10/2006    Alyus Mofield P, PTA 08/22/2016, 2:49 PM  [facility] [address] [place], [place], [address] Phone: [phone]   Fax:  [phone]  Name: ZARED KNOTH MRN: [id number] Date of Birth: November 15, 1945

## 2016-08-23 ENCOUNTER — Ambulatory Visit (INDEPENDENT_AMBULATORY_CARE_PROVIDER_SITE_OTHER): Payer: Medicare Other | Admitting: Adult Health

## 2016-08-23 ENCOUNTER — Encounter: Payer: Self-pay | Admitting: Adult Health

## 2016-08-23 VITALS — BP 138/62 | HR 80 | Temp 98.0°F | Ht 74.0 in | Wt 221.8 lb

## 2016-08-23 DIAGNOSIS — G4700 Insomnia, unspecified: Secondary | ICD-10-CM

## 2016-08-23 DIAGNOSIS — F339 Major depressive disorder, recurrent, unspecified: Secondary | ICD-10-CM | POA: Diagnosis not present

## 2016-08-23 DIAGNOSIS — I639 Cerebral infarction, unspecified: Secondary | ICD-10-CM | POA: Diagnosis not present

## 2016-08-23 NOTE — Progress Notes (Signed)
Subjective:    Patient ID: Luis Yu, male    DOB: 05-22-1945, 71 y.o.   MRN: [id number]  HPI  71 year old male who  has a past medical history of Adjustment disorder with mixed emotional features; Atrial flutter (HCC); CAD (coronary artery disease); Depression; Diabetes mellitus type II, uncontrolled (HCC); Diabetic peripheral neuropathy (HCC); Erectile dysfunction; GERD (gastroesophageal reflux disease); Herpes zoster; Hyperlipidemia; Hypertension; Obesity; Osteoarthritis; Postherpetic neuralgia; and Sleep apnea.  He presents to the office today for follow up regarding depression and insomnia. During his last visit Remeron was increased from 15 mg to 30mg .   He reports that since the increase he has noticed improvement in his depression. He continues to have issues with insomnia at night but sleeps during the day. This is how he has always had his sleep cycle do to working shift work for most of his life. I have asked him to try switching his sleep cycle but he does not seem willing to do this.   Today in the office he reports that he is feeling better than he has in a long time.   Review of Systems See HPI   Past Medical History:  Diagnosis Date  . Adjustment disorder with mixed emotional features   . Atrial flutter (HCC)   . CAD (coronary artery disease)   . Depression   . Diabetes mellitus type II, uncontrolled (HCC)   . Diabetic peripheral neuropathy (HCC)   . Erectile dysfunction   . GERD (gastroesophageal reflux disease)   . Herpes zoster   . Hyperlipidemia   . Hypertension   . Obesity   . Osteoarthritis   . Postherpetic neuralgia   . Sleep apnea     Social History   Social History  . Marital status: Married    Spouse name: [name]  . Number of children: 1  . Years of education: 2213   Occupational History  . Retired Naval architecttruck driver Retired   Social History Main Topics  . Smoking status: Never Smoker  . Smokeless tobacco: Never Used  . Alcohol use 1.2 oz/week      2 Standard drinks or equivalent per week     Comment: twice a month.  . Drug use: No  . Sexual activity: Yes    Partners: Female   Other Topics Concern  . Not on file   Social History Narrative   Married [name]([name])   Regular exercise: none   Caffeine use: cup of coffee daily    Retired Naval architecttruck driver   Caffeine- twice daily             Past Surgical History:  Procedure Laterality Date  . CARDIOVERSION N/A 11/10/2015   Procedure: CARDIOVERSION;  Surgeon: Laurey Moralealton S McLean, MD;  [facility];  Service: Cardiovascular;  Laterality: N/A;  . CARDIOVERSION N/A 02/21/2016   Procedure: CARDIOVERSION;  Surgeon: Lewayne BuntingBrian S Crenshaw, MD;  [facility];  Service: Cardiovascular;  Laterality: N/A;  . CORONARY ARTERY BYPASS GRAFT  1996   eith a LIMA to the LAD, saphenous vein graft to the acute marginal, saphenous vein graft to the PDA and saphenous vein graft to the circumflex.  [name]. KNEE ARTHROSCOPY  05/2004   right knee  . REPLACEMENT TOTAL KNEE BILATERAL    . SHOULDER SURGERY      Family History  Problem Relation Age of Onset  . Coronary artery disease Mother        deceased at 8375  . Coronary artery disease Father  died age 32  . Stroke Unknown        half brother  . Stroke Brother     No Known Allergies  Current Outpatient Prescriptions on File Prior to Visit  Medication Sig Dispense Refill  . acetaminophen (TYLENOL) 500 MG tablet Take 2 tablets (1,000 mg total) by mouth every 6 (six) hours as needed. (Patient taking differently: Take 1,000 mg by mouth every 6 (six) hours as needed for mild pain. ) 30 tablet 0  . apixaban (ELIQUIS) 5 MG TABS tablet Take 1 tablet (5 mg total) by mouth 2 (two) times daily. 180 tablet 3  . aspirin EC 81 MG tablet Take 1 tablet (81 mg total) by mouth daily.    [name] atorvastatin (LIPITOR) 80 MG tablet Take 1 tablet (80 mg total) by mouth daily. 90 tablet 3  . buPROPion (WELLBUTRIN SR) 150 MG 12 hr tablet Take 1 tablet (150 mg total)  by mouth 2 (two) times daily. 180 tablet 1  . glipiZIDE (GLUCOTROL) 10 MG tablet Take 10 mg by mouth daily.    [name] glucose blood (ACCU-CHEK ACTIVE STRIPS) test strip Use as instructed 100 each 12  . insulin aspart (NOVOLOG) 100 UNIT/ML injection Correction coverage: Sensitive (thin, NPO, renal)  CBG < 70: implement hypoglycemia protocol  CBG 70 - 120: 0 units  CBG 121 - 150: 1 unit  CBG 151 - 200: 2 units  CBG 201 - 250: 3 units  CBG 251 - 300: 5 units  CBG 301 - 350: 7 units  CBG 351 - 400 9 units  CBG > 400 call MD and obtain STAT lab verification 10 mL 11  . Insulin Glargine (BASAGLAR KWIKPEN) 100 UNIT/ML SOPN Inject 0.25 mLs (25 Units total) into the skin at bedtime. (Patient taking differently: Inject 20 Units into the skin at bedtime. ) 3 pen 11  . Insulin Pen Needle (PEN NEEDLES 31GX5/16") 31G X 8 MM MISC Use to inject insulin 5 times a day. 100 each 11  . isosorbide mononitrate (IMDUR) 30 MG 24 hr tablet Take 1 tablet (30 mg total) by mouth daily. 90 tablet 3  . Lancets (ACCU-CHEK MULTICLIX) lancets Use up to 5 times a day to check blood sugar. 100 each 5  . metFORMIN (GLUCOPHAGE) 500 MG tablet Take 1 tablet (500 mg total) by mouth 2 (two) times daily with a meal. 60 tablet 1  . mirtazapine (REMERON) 30 MG tablet Take 1 tablet (30 mg total) by mouth at bedtime. 30 tablet 3  . nebivolol 20 MG TABS Take 1 tablet (20 mg total) by mouth daily. 30 tablet 1  . omeprazole (PRILOSEC) 20 MG capsule TAKE ONE CAPSULE TWICE A DAY BEFORE A MEAL 180 capsule 0  . penicillin v potassium (VEETID) 500 MG tablet Take 1 tablet (500 mg total) by mouth 3 (three) times daily. 30 tablet 0  . thiamine 100 MG tablet Take 1 tablet (100 mg total) by mouth daily. 90 tablet 3  . vitamin B-12 (CYANOCOBALAMIN) 1000 MCG tablet Take 2 tablets (2,000 mcg total) by mouth daily. 180 tablet 3   No current facility-administered medications on file prior to visit.     BP 138/62 (BP Location: Left Arm, Patient Position:  Sitting, Cuff Size: Normal)   Pulse 80   Temp 98 F (36.7 C) (Oral)   Ht 6\' 2"  (1.88 m)   Wt 221 lb 12.8 oz (100.6 kg)   SpO2 95%   BMI 28.48 kg/m       Objective:  Physical Exam  Constitutional: He is oriented to person, place, and time. He appears well-developed and well-nourished. No distress.  Cardiovascular: Normal rate, normal heart sounds and intact distal pulses.  An irregularly irregular rhythm present. Exam reveals no gallop and no friction rub.   No murmur heard. Pulmonary/Chest: Effort normal and breath sounds normal. No respiratory distress. He has no wheezes. He has no rales. He exhibits no tenderness.  Less SOB then he has in the past  Musculoskeletal:  Using a rolling walker  Neurological: He is alert and oriented to person, place, and time.  Skin: Skin is warm and dry. No rash noted. He is not diaphoretic. No erythema. No pallor.  Psychiatric: He has a normal mood and affect. His behavior is normal. Judgment and thought content normal.  Nursing note and vitals reviewed.     Assessment & Plan:  1. Depression, recurrent (HCC) - Continue with current dose of remeron. I will consider increasing in the future but am afraid of him fallings  2. Insomnia, unspecified type - Continue to work on staying awake during the day and sleeping at night   Shirline Frees, NP

## 2016-08-24 ENCOUNTER — Ambulatory Visit: Payer: Medicare Other | Admitting: Physical Therapy

## 2016-08-24 ENCOUNTER — Encounter: Payer: Self-pay | Admitting: Physical Therapy

## 2016-08-24 DIAGNOSIS — Z760 Encounter for issue of repeat prescription: Secondary | ICD-10-CM | POA: Diagnosis not present

## 2016-08-24 DIAGNOSIS — M6281 Muscle weakness (generalized): Secondary | ICD-10-CM

## 2016-08-24 DIAGNOSIS — F339 Major depressive disorder, recurrent, unspecified: Secondary | ICD-10-CM | POA: Diagnosis not present

## 2016-08-24 DIAGNOSIS — R260 Ataxic gait: Secondary | ICD-10-CM | POA: Diagnosis not present

## 2016-08-24 DIAGNOSIS — R2681 Unsteadiness on feet: Secondary | ICD-10-CM

## 2016-08-24 DIAGNOSIS — I69314 Frontal lobe and executive function deficit following cerebral infarction: Secondary | ICD-10-CM | POA: Diagnosis not present

## 2016-08-24 NOTE — Therapy (Signed)
[name] [address] [place], [place], [address] Phone: [phone]   Fax:  [phone]  Physical Therapy Treatment  Patient Details  Name: Luis Yu MRN: [id number] Date of Birth: 06/26/1945 Referring Provider: Claudette Laws, MD  Encounter Date: 08/24/2016      PT End of Session - 08/24/16 1122    Visit Number 18   Number of Visits 32   Date for PT Re-Evaluation 09/22/16   PT Start Time 1119  2 units secondary to rest breaks required during treatment   PT Stop Time 1200   PT Time Calculation (min) 41 min   Activity Tolerance Patient tolerated treatment well   Behavior During Therapy [facility] for tasks assessed/performed      Past Medical History:  Diagnosis Date  . Adjustment disorder with mixed emotional features   . Atrial flutter (HCC)   . CAD (coronary artery disease)   . Depression   . Diabetes mellitus type II, uncontrolled (HCC)   . Diabetic peripheral neuropathy (HCC)   . Erectile dysfunction   . GERD (gastroesophageal reflux disease)   . Herpes zoster   . Hyperlipidemia   . Hypertension   . Obesity   . Osteoarthritis   . Postherpetic neuralgia   . Sleep apnea     Past Surgical History:  Procedure Laterality Date  . CARDIOVERSION N/A 11/10/2015   Procedure: CARDIOVERSION;  Surgeon: Laurey Morale, MD;  Location: [facility] ENDOSCOPY;  Service: Cardiovascular;  Laterality: N/A;  . CARDIOVERSION N/A 02/21/2016   Procedure: CARDIOVERSION;  Surgeon: Lewayne Bunting, MD;  Location: [facility] ENDOSCOPY;  Service: Cardiovascular;  Laterality: N/A;  . CORONARY ARTERY BYPASS GRAFT  1996   eith a LIMA to the LAD, saphenous vein graft to the acute marginal, saphenous vein graft to the PDA and saphenous vein graft to the circumflex.  [name] KNEE ARTHROSCOPY  05/2004   right knee  . REPLACEMENT TOTAL KNEE BILATERAL    . SHOULDER SURGERY      There were no vitals filed for this visit.      Subjective Assessment - 08/24/16 1122    Subjective No new complaints today.   Patient is accompained by: Family member  Wife   Pertinent History Recent falls.  Peripheral neuropathy. Chronic atrial fib.; bil TKA's.   How long can you walk comfortably? Short distances with a FWW.   Diagnostic tests MRI; CT   Currently in Pain? No/denies            [facility] PT Assessment - 08/24/16 0001      Assessment   Medical Diagnosis Left CVA.   Onset Date/Surgical Date 07/07/16     Precautions   Precautions Fall     Restrictions   Weight Bearing Restrictions No                     [facility] Adult PT Treatment/Exercise - 08/24/16 0001      Knee/Hip Exercises: Aerobic   Nustep L5 x15 min UE/LE activity     Knee/Hip Exercises: Machines for Strengthening   Cybex Knee Extension 20# 3x15 reps   Cybex Knee Flexion 50# 3x15 reps     Knee/Hip Exercises: Seated   Sit to Sand 20 reps;without UE support     Knee/Hip Exercises: Supine   Straight Leg Raises Strengthening;Both;3 sets;10 reps             Balance Exercises - 08/24/16 1202      Balance Exercises: Standing   Tandem  Stance Eyes open;Foam/compliant surface;Intermittent upper extremity support  semitandem x4 min   Standing, One Foot on a Step Eyes open;Foam/compliant surface;8 inch  L foot on step 2 min 20 sec, R foot on step 3.5 min             PT Short Term Goals - 08/10/16 1201      PT SHORT TERM GOAL #2   Title Increase Berg score to 32/56.   Time 4   Period Weeks   Status Achieved           PT Long Term Goals - 07/16/16 0930      PT LONG TERM GOAL #1   Title Independent with an advanced HEP.   Time 8   Period Weeks   Status On-going     PT LONG TERM GOAL #2   Title Increase bilateral LE strength to a solid 4+/5 to increase stability for functional activites.   Time 8   Period Weeks   Status On-going     PT LONG TERM GOAL #3   Title Walk in clinic with FWW 500 feet with supervision only without resting and 02 not going  below 93%.   Time 8   Period Weeks   Status On-going     PT LONG TERM GOAL #4   Title Perform a reciprocating stair gait with one railing.   Time 8   Period Weeks   Status On-going     PT LONG TERM GOAL #5   Title Increase Berg score to 44-45/56.   Time 8   Period Weeks   Status On-going               Plan - 08/24/16 1209    Clinical Impression Statement Patient tolerated today's treatment fairly well although patient's greatest difficulty today was seen as simulated SLS with foot on step. Some difficulty noted with machine knee strengthening today as weight for HS curl increased. As fatigue sets in patient requires multiple attempts of sit to stands and requires rest breaks. Patient did fairly well with semitandem stance on airex surface without UE support. Definite focus in future treatments should focus on SLS or simulated SLS activities. Patient required rest breaks throughout SLS activities.   Clinical Impairments Affecting Rehab Potential Poor balance.  H/o CVA.   PT Frequency 2x / week   PT Duration 8 weeks   PT Treatment/Interventions ADLs/Self Care Home Management;Functional mobility training;Stair training;Gait training;Therapeutic activities;Therapeutic exercise;Balance training;Neuromuscular re-education;Patient/family education   PT Next Visit Plan Please monitor BP; HR and 02 sat.  Bilateral LE strengthening to include core exercises.  Gait and balance activites.      Consulted and Agree with Plan of Care Patient   Family Member Consulted Wife      Patient will benefit from skilled therapeutic intervention in order to improve the following deficits and impairments:  Abnormal gait, Decreased activity tolerance, Decreased balance, Decreased mobility, Decreased coordination, Decreased strength, Difficulty walking  Visit Diagnosis: Muscle weakness (generalized)  Unsteadiness on feet     Problem List Patient Active Problem List   Diagnosis Date Noted  .  Frontal lobe and executive function deficit following cerebral infarction 07/02/2016  . Neurologic gait disorder   . Coronary artery disease involving coronary bypass graft of native heart without angina pectoris   . Benign essential HTN   . Acute kidney injury (HCC)   . Nausea & vomiting 06/28/2016  . Ischemic stroke of frontal lobe (HCC) 06/28/2016  . Thrombocytopenia (HCC) 06/28/2016  .  Renal failure (ARF), acute on chronic (HCC) 06/28/2016  . Stroke (cerebrum) (HCC) 06/28/2016  . Acute CVA (cerebrovascular accident) (HCC) 06/28/2016  . Insomnia 02/02/2016  . Atrial fibrillation (HCC) 10/27/2015  . Uncoordinated movements 02/24/2014  . Unspecified hereditary and idiopathic peripheral neuropathy 02/24/2013  . Low back pain 02/24/2013  . Abnormality of gait 02/09/2013  . Memory loss, short term 12/05/2011  . LUMBAR STRAIN, ACUTE 05/05/2010  . OSA (obstructive sleep apnea) 09/14/2009  . BPH (benign prostatic hyperplasia) 07/27/2009  . CARDIOVASCULAR STUDIES, ABNORMAL 07/27/2009  . CHEST PAIN 06/08/2009  . OSTEOARTHRITIS 05/30/2009  . HEEL PAIN, LEFT 05/24/2009  . Dyspnea on exertion 05/24/2009  . ADJUSTMENT DISORDER WITH MIXED FEATURES 08/25/2008  . Diabetes type 2, uncontrolled (HCC) 02/24/2008  . ERECTILE DYSFUNCTION 02/10/2007  . Hyperlipidemia 10/09/2006  . OBESITY 10/09/2006  . Essential hypertension 10/09/2006  . Coronary atherosclerosis 10/09/2006  . GERD 10/09/2006  . DM (diabetes mellitus) type II uncontrolled with retinopathy 06/10/2006    Evelene CroonKelsey M Parsons, PTA 08/24/2016, 12:17 PM  [name] [address] [place], [place], [address] Phone: [phone]   Fax:  [phone]  Name: Doretha ImusDonald W Woolf MRN: [id number] Date of Birth: [birth year]

## 2016-08-27 ENCOUNTER — Other Ambulatory Visit: Payer: Self-pay | Admitting: *Deleted

## 2016-08-27 ENCOUNTER — Ambulatory Visit: Payer: Medicare Other | Admitting: Physical Therapy

## 2016-08-27 ENCOUNTER — Encounter: Payer: Self-pay | Admitting: Physical Therapy

## 2016-08-27 VITALS — HR 71

## 2016-08-27 DIAGNOSIS — I69314 Frontal lobe and executive function deficit following cerebral infarction: Secondary | ICD-10-CM | POA: Diagnosis not present

## 2016-08-27 DIAGNOSIS — M6281 Muscle weakness (generalized): Secondary | ICD-10-CM

## 2016-08-27 DIAGNOSIS — R260 Ataxic gait: Secondary | ICD-10-CM | POA: Diagnosis not present

## 2016-08-27 DIAGNOSIS — Z760 Encounter for issue of repeat prescription: Secondary | ICD-10-CM | POA: Diagnosis not present

## 2016-08-27 DIAGNOSIS — R2681 Unsteadiness on feet: Secondary | ICD-10-CM | POA: Diagnosis not present

## 2016-08-27 DIAGNOSIS — F339 Major depressive disorder, recurrent, unspecified: Secondary | ICD-10-CM | POA: Diagnosis not present

## 2016-08-27 NOTE — Therapy (Signed)
[name] [address] [place], [place], [address] Phone: [phone]   Fax:  [phone]  Physical Therapy Treatment  Patient Details  Name: Luis ImusDonald W Yu MRN: [id number] Date of Birth: 31-Mar-1945 Referring Provider: Claudette LawsAndrew Kirsteins, MD  Encounter Date: 08/27/2016      PT End of Session - 08/27/16 1353    Visit Number 19   Number of Visits 32   Date for PT Re-Evaluation 09/22/16   PT Start Time 1352   PT Stop Time 1419   PT Time Calculation (min) 27 min   Activity Tolerance Patient limited by fatigue;Treatment limited secondary to medical complications (Comment)  stopped due to fatigue and SOB symptoms   Behavior [facility] for tasks assessed/performed      Past Medical History:  Diagnosis Date  . Adjustment disorder with mixed emotional features   . Atrial flutter (HCC)   . CAD (coronary artery disease)   . Depression   . Diabetes mellitus type II, uncontrolled (HCC)   . Diabetic peripheral neuropathy (HCC)   . Erectile dysfunction   . GERD (gastroesophageal reflux disease)   . Herpes zoster   . Hyperlipidemia   . Hypertension   . Obesity   . Osteoarthritis   . Postherpetic neuralgia   . Sleep apnea     Past Surgical History:  Procedure Laterality Date  . CARDIOVERSION N/A 11/10/2015   Procedure: CARDIOVERSION;  Surgeon: Laurey Moralealton S McLean, MD;  [facility];  Service: Cardiovascular;  Laterality: N/A;  . CARDIOVERSION N/A 02/21/2016   Procedure: CARDIOVERSION;  Surgeon: Lewayne BuntingBrian S Crenshaw, MD;  [facility];  Service: Cardiovascular;  Laterality: N/A;  . CORONARY ARTERY BYPASS GRAFT  1996   eith a LIMA to the LAD, saphenous vein graft to the acute marginal, saphenous vein graft to the PDA and saphenous vein graft to the circumflex.  [name]. KNEE ARTHROSCOPY  05/2004   right knee  . REPLACEMENT TOTAL KNEE BILATERAL    . SHOULDER SURGERY      Vitals:   08/27/16 1357  Pulse: 71  SpO2: 98%         Subjective Assessment - 08/27/16 1352    Subjective Reports he felt SOB upon arrival. Wife reports that they had a terrible weekend with A Fib symptoms and "attitude."   Patient is accompained by: Family member  Wife   Pertinent History Recent falls.  Peripheral neuropathy. Chronic atrial fib.; bil TKA's.   How long can you walk comfortably? Short distances with a FWW.   Diagnostic tests MRI; CT   Currently in Pain? Yes   Pain Score 5    Pain Location Generalized   Pain Descriptors / Indicators Discomfort   Pain Type Acute pain            [facility] PT Assessment - 08/27/16 0001      Assessment   Medical Diagnosis Left CVA.   Onset Date/Surgical Date 07/07/16     Precautions   Precautions Fall     Restrictions   Weight Bearing Restrictions No                     [facility] Adult PT Treatment/Exercise - 08/27/16 0001      Knee/Hip Exercises: Aerobic   Nustep L6 x15 min UE/LE activity  98%, 83 bpm after 15 min     Knee/Hip Exercises: Machines for Strengthening   Cybex Knee Extension 20# 2x15 reps   Cybex Knee Flexion 50# 2x15 reps  Knee/Hip Exercises: Standing   Heel Raises Both;2 sets;10 reps   Forward Step Up Left;1 set;10 reps;Hand Hold: 2;Step Height: 6"                  PT Short Term Goals - 08/10/16 1201      PT SHORT TERM GOAL #2   Title Increase Berg score to 32/56.   Time 4   Period Weeks   Status Achieved           PT Long Term Goals - 07/16/16 0930      PT LONG TERM GOAL #1   Title Independent with an advanced HEP.   Time 8   Period Weeks   Status On-going     PT LONG TERM GOAL #2   Title Increase bilateral LE strength to a solid 4+/5 to increase stability for functional activites.   Time 8   Period Weeks   Status On-going     PT LONG TERM GOAL #3   Title Walk in clinic with FWW 500 feet with supervision only without resting and 02 not going below 93%.   Time 8   Period Weeks   Status On-going     PT  LONG TERM GOAL #4   Title Perform a reciprocating stair gait with one railing.   Time 8   Period Weeks   Status On-going     PT LONG TERM GOAL #5   Title Increase Berg score to 44-45/56.   Time 8   Period Weeks   Status On-going               Plan - 08/27/16 1424    Clinical Impression Statement Patient very limited today secondary to fatigue and SOB symptoms that patient's wife states are his A Fib symptoms that began after he was in the heat too long yesterday. Patient educated that he could take rest breaks as needed and tretament could be stopped if required. Patient presented as very SOB during treatment but vitals taken during treatment were within limits in regards to O2 and heart rate. Patient required increased seated rest breaks and breaks for water during standing exercises. Patient dismissed from today's treatment secondary to fatigue and SOB.   Clinical Impairments Affecting Rehab Potential Poor balance.  H/o CVA.   PT Frequency 2x / week   PT Duration 8 weeks   PT Treatment/Interventions ADLs/Self Care Home Management;Functional mobility training;Stair training;Gait training;Therapeutic activities;Therapeutic exercise;Balance training;Neuromuscular re-education;Patient/family education   PT Next Visit Plan Please monitor BP; HR and 02 sat.  Bilateral LE strengthening to include core exercises.  Gait and balance activites.      Consulted and Agree with Plan of Care Patient   Family Member Consulted Wife      Patient will benefit from skilled therapeutic intervention in order to improve the following deficits and impairments:  Abnormal gait, Decreased activity tolerance, Decreased balance, Decreased mobility, Decreased coordination, Decreased strength, Difficulty walking  Visit Diagnosis: Muscle weakness (generalized)  Unsteadiness on feet     Problem List Patient Active Problem List   Diagnosis Date Noted  . Frontal lobe and executive function deficit  following cerebral infarction 07/02/2016  . Neurologic gait disorder   . Coronary artery disease involving coronary bypass graft of native heart without angina pectoris   . Benign essential HTN   . Acute kidney injury (HCC)   . Nausea & vomiting 06/28/2016  . Ischemic stroke of frontal lobe (HCC) 06/28/2016  . Thrombocytopenia (HCC) 06/28/2016  .  Renal failure (ARF), acute on chronic (HCC) 06/28/2016  . Stroke (cerebrum) (HCC) 06/28/2016  . Acute CVA (cerebrovascular accident) (HCC) 06/28/2016  . Insomnia 02/02/2016  . Atrial fibrillation (HCC) 10/27/2015  . Uncoordinated movements 02/24/2014  . Unspecified hereditary and idiopathic peripheral neuropathy 02/24/2013  . Low back pain 02/24/2013  . Abnormality of gait 02/09/2013  . Memory loss, short term 12/05/2011  . LUMBAR STRAIN, ACUTE 05/05/2010  . OSA (obstructive sleep apnea) 09/14/2009  . BPH (benign prostatic hyperplasia) 07/27/2009  . CARDIOVASCULAR STUDIES, ABNORMAL 07/27/2009  . CHEST PAIN 06/08/2009  . OSTEOARTHRITIS 05/30/2009  . HEEL PAIN, LEFT 05/24/2009  . Dyspnea on exertion 05/24/2009  . ADJUSTMENT DISORDER WITH MIXED FEATURES 08/25/2008  . Diabetes type 2, uncontrolled (HCC) 02/24/2008  . ERECTILE DYSFUNCTION 02/10/2007  . Hyperlipidemia 10/09/2006  . OBESITY 10/09/2006  . Essential hypertension 10/09/2006  . Coronary atherosclerosis 10/09/2006  . GERD 10/09/2006  . DM (diabetes mellitus) type II uncontrolled with retinopathy 06/10/2006    Evelene Croon, PTA 08/27/2016, 2:29 PM  [name] [address] [place], [place], [address] Phone: [phone]   Fax:  [phone]  Name: Luis Yu MRN: [id number] Date of Birth: 1945/05/25

## 2016-08-27 NOTE — Patient Outreach (Signed)
[facility] Care Management  08/27/2016  Luis Yu Aug 11, 1945 [id number]   CSW attempted to call patient/wife to follow-up on initial home visit but no answer [phone] and not able to leave a voicemail. CSW also tried patient's wife cellphone ([phone]) and left a voicemail. CSW will await return call or will try back in a few days.    Lincoln MaxinKelly Shinika Estelle, LCSW [facility]  Clinical Social Worker cell #: [phone]

## 2016-08-29 ENCOUNTER — Ambulatory Visit: Payer: Medicare Other | Admitting: Physical Therapy

## 2016-08-29 DIAGNOSIS — R2681 Unsteadiness on feet: Secondary | ICD-10-CM

## 2016-08-29 DIAGNOSIS — M6281 Muscle weakness (generalized): Secondary | ICD-10-CM | POA: Diagnosis not present

## 2016-08-29 DIAGNOSIS — F339 Major depressive disorder, recurrent, unspecified: Secondary | ICD-10-CM | POA: Diagnosis not present

## 2016-08-29 DIAGNOSIS — I69314 Frontal lobe and executive function deficit following cerebral infarction: Secondary | ICD-10-CM | POA: Diagnosis not present

## 2016-08-29 DIAGNOSIS — R260 Ataxic gait: Secondary | ICD-10-CM | POA: Diagnosis not present

## 2016-08-29 DIAGNOSIS — Z760 Encounter for issue of repeat prescription: Secondary | ICD-10-CM | POA: Diagnosis not present

## 2016-08-29 NOTE — Therapy (Signed)
[name] [address] [place], [place], [address] Phone: [phone]   Fax:  [phone]  Physical Therapy Treatment  Patient Details  Name: Luis Yu MRN: [id number] Date of Birth: [birth year] Referring Provider: Claudette Laws, MD  Encounter Date: 08/29/2016      PT End of Session - 08/29/16 1412    Visit Number 20   Number of Visits 32   Date for PT Re-Evaluation 09/22/16   PT Start Time 1358   PT Stop Time 1441   PT Time Calculation (min) 43 min   Equipment Utilized During Treatment Gait belt   Activity Tolerance Patient tolerated treatment well   Behavior During Therapy [facility] for tasks assessed/performed      Past Medical History:  Diagnosis Date  . Adjustment disorder with mixed emotional features   . Atrial flutter (HCC)   . CAD (coronary artery disease)   . Depression   . Diabetes mellitus type II, uncontrolled (HCC)   . Diabetic peripheral neuropathy (HCC)   . Erectile dysfunction   . GERD (gastroesophageal reflux disease)   . Herpes zoster   . Hyperlipidemia   . Hypertension   . Obesity   . Osteoarthritis   . Postherpetic neuralgia   . Sleep apnea     Past Surgical History:  Procedure Laterality Date  . CARDIOVERSION N/A 11/10/2015   Procedure: CARDIOVERSION;  Surgeon: Laurey Morale, MD;  Location: [facility] ENDOSCOPY;  Service: Cardiovascular;  Laterality: N/A;  . CARDIOVERSION N/A 02/21/2016   Procedure: CARDIOVERSION;  Surgeon: Lewayne Bunting, MD;  Location: [facility] ENDOSCOPY;  Service: Cardiovascular;  Laterality: N/A;  . CORONARY ARTERY BYPASS GRAFT  1996   eith a LIMA to the LAD, saphenous vein graft to the acute marginal, saphenous vein graft to the PDA and saphenous vein graft to the circumflex.  [name] KNEE ARTHROSCOPY  05/2004   right knee  . REPLACEMENT TOTAL KNEE BILATERAL    . SHOULDER SURGERY      There were no vitals filed for this visit.      Subjective Assessment - 08/29/16 1403    Subjective  Patient feeling some fatigue yet better than last treatment    Patient is accompained by: Family member   Pertinent History Recent falls.  Peripheral neuropathy. Chronic atrial fib.; bil TKA's.   How long can you walk comfortably? Short distances with a FWW.   Diagnostic tests MRI; CT   Currently in Pain? No/denies                         [facility] Adult PT Treatment/Exercise - 08/29/16 0001      Knee/Hip Exercises: Aerobic   Nustep L6 x15 min UE/LE activity     Knee/Hip Exercises: Machines for Strengthening   Cybex Knee Extension 20# 2x15 reps   Cybex Knee Flexion 50# 2x15 reps             Balance Exercises - 08/29/16 1406      Balance Exercises: Standing   Standing Eyes Opened Narrow base of support (BOS);Wide (BOA);Foam/compliant surface;Time  x54min   Standing, One Foot on a Step 8 inch;Eyes open;2 reps;10 secs   Rockerboard Anterior/posterior    Step Ups Forward;6 inch;UE support 1  2x10   Marching Limitations 2x10   Heel Raises Limitations x10   Toe Raise Limitations x10   Sit to Stand Time x15             PT Short Term  Goals - 08/10/16 1201      PT SHORT TERM GOAL #2   Title Increase Berg score to 32/56.   Time 4   Period Weeks   Status Achieved           PT Long Term Goals - 07/16/16 0930      PT LONG TERM GOAL #1   Title Independent with an advanced HEP.   Time 8   Period Weeks   Status On-going     PT LONG TERM GOAL #2   Title Increase bilateral LE strength to a solid 4+/5 to increase stability for functional activites.   Time 8   Period Weeks   Status On-going     PT LONG TERM GOAL #3   Title Walk in clinic with FWW 500 feet with supervision only without resting and 02 not going below 93%.   Time 8   Period Weeks   Status On-going     PT LONG TERM GOAL #4   Title Perform a reciprocating stair gait with one railing.   Time 8   Period Weeks   Status On-going     PT LONG TERM GOAL #5   Title Increase Berg  score to 44-45/56.   Time 8   Period Weeks   Status On-going               Plan - 08/29/16 1413    Clinical Impression Statement Patient tolerated treatment well today. Patient progressing slowly with activities due to limitations from other medical issues. Patient reported that patient does not do any evercises at home. Patient has reported no falls today. Patient required CGA/SBA throghout for safety. Patient had a few LOB with balance exercises yet able to recover with assistance. Patient vitals WNL 124/82 / O2 96%/ HR 72 bpm after tx today.Goals ongoing due to strength and balance defcits.    Clinical Impairments Affecting Rehab Potential Poor balance.  H/o CVA.   PT Frequency 2x / week   PT Duration 8 weeks   PT Treatment/Interventions ADLs/Self Care Home Management;Functional mobility training;Stair training;Gait training;Therapeutic activities;Therapeutic exercise;Balance training;Neuromuscular re-education;Patient/family education   PT Next Visit Plan Please monitor BP; HR and 02 sat.  Bilateral LE strengthening to include core exercises.  Gait and balance activites.      Consulted and Agree with Plan of Care Patient      Patient will benefit from skilled therapeutic intervention in order to improve the following deficits and impairments:  Abnormal gait, Decreased activity tolerance, Decreased balance, Decreased mobility, Decreased coordination, Decreased strength, Difficulty walking  Visit Diagnosis: Muscle weakness (generalized)  Unsteadiness on feet     Problem List Patient Active Problem List   Diagnosis Date Noted  . Frontal lobe and executive function deficit following cerebral infarction 07/02/2016  . Neurologic gait disorder   . Coronary artery disease involving coronary bypass graft of native heart without angina pectoris   . Benign essential HTN   . Acute kidney injury (HCC)   . Nausea & vomiting 06/28/2016  . Ischemic stroke of frontal lobe (HCC)  06/28/2016  . Thrombocytopenia (HCC) 06/28/2016  . Renal failure (ARF), acute on chronic (HCC) 06/28/2016  . Stroke (cerebrum) (HCC) 06/28/2016  . Acute CVA (cerebrovascular accident) (HCC) 06/28/2016  . Insomnia 02/02/2016  . Atrial fibrillation (HCC) 10/27/2015  . Uncoordinated movements 02/24/2014  . Unspecified hereditary and idiopathic peripheral neuropathy 02/24/2013  . Low back pain 02/24/2013  . Abnormality of gait 02/09/2013  . Memory loss, short term 12/05/2011  .  LUMBAR STRAIN, ACUTE 05/05/2010  . OSA (obstructive sleep apnea) 09/14/2009  . BPH (benign prostatic hyperplasia) 07/27/2009  . CARDIOVASCULAR STUDIES, ABNORMAL 07/27/2009  . CHEST PAIN 06/08/2009  . OSTEOARTHRITIS 05/30/2009  . HEEL PAIN, LEFT 05/24/2009  . Dyspnea on exertion 05/24/2009  . ADJUSTMENT DISORDER WITH MIXED FEATURES 08/25/2008  . Diabetes type 2, uncontrolled (HCC) 02/24/2008  . ERECTILE DYSFUNCTION 02/10/2007  . Hyperlipidemia 10/09/2006  . OBESITY 10/09/2006  . Essential hypertension 10/09/2006  . Coronary atherosclerosis 10/09/2006  . GERD 10/09/2006  . DM (diabetes mellitus) type II uncontrolled with retinopathy 06/10/2006    Cathie HoopsChristina Dunford, PTA 08/29/16 2:41 PM  [name] [address] [place], [place], [address] Phone: [phone]   Fax:  [phone]  Name: Doretha ImusDonald W Rivet MRN: [id number] Date of Birth: 26-Aug-1945

## 2016-08-30 ENCOUNTER — Other Ambulatory Visit: Payer: Self-pay | Admitting: *Deleted

## 2016-08-30 DIAGNOSIS — I482 Chronic atrial fibrillation, unspecified: Secondary | ICD-10-CM

## 2016-08-30 NOTE — Patient Outreach (Signed)
[facility]) Care Management  08/30/2016  Doretha ImusDonald W Pentecost 1945-11-21 [id number]   CSW received call back from patient's wife, [name] states that she was able to go to the senior center after rehab last week and got a calendar of events but that most of the events are in the mornings, and patient is not a morning person. Patient's wife had also contact Olegario MessierKathy at [facility] [facility] [employer] to inquire about [facility] and was waiting for a call back when they can come out to assess patient, in the meantime - patient's friend had given her a contact for a local CNA that she has left a voicemail for & is waiting for a call back from. Patient is still [name] Monday, Wednesday & Friday - wife states that patient's Afib has been getting worse - that he's been having "episodes" & reports that while was at therapy on Monday his heart rate jumped up & that he hasn't been looking too well. Wife reports though that his Diabetes has been under control better, that his A1C is at 7.6 used to be at 12. His next appointment with his PCP - Shirline Freesory Nafziger, NP with [facility] isn't until 10/25/16. Patient's [facility] but [facility] at this time as she is concerned about him. CSW will make referral [facility] & follow-up in 3 weeks.    Lincoln MaxinKelly Karrisa Didio, LCSW [facility]  Clinical Social Worker cell #: [phone]

## 2016-08-31 ENCOUNTER — Ambulatory Visit: Payer: Medicare Other | Admitting: Physical Therapy

## 2016-08-31 DIAGNOSIS — R260 Ataxic gait: Secondary | ICD-10-CM | POA: Diagnosis not present

## 2016-08-31 DIAGNOSIS — Z760 Encounter for issue of repeat prescription: Secondary | ICD-10-CM | POA: Diagnosis not present

## 2016-08-31 DIAGNOSIS — I69314 Frontal lobe and executive function deficit following cerebral infarction: Secondary | ICD-10-CM | POA: Diagnosis not present

## 2016-08-31 DIAGNOSIS — R2681 Unsteadiness on feet: Secondary | ICD-10-CM | POA: Diagnosis not present

## 2016-08-31 DIAGNOSIS — M6281 Muscle weakness (generalized): Secondary | ICD-10-CM | POA: Diagnosis not present

## 2016-08-31 DIAGNOSIS — F339 Major depressive disorder, recurrent, unspecified: Secondary | ICD-10-CM | POA: Diagnosis not present

## 2016-08-31 NOTE — Addendum Note (Signed)
Addended by: Lincoln MaxinHARRISON, Shadi Larner F on: 08/31/2016 04:29 PM   Modules accepted: Orders

## 2016-08-31 NOTE — Therapy (Signed)
[name] [address] [place], [place], [address] Phone: [phone]   Fax:  [phone]  Physical Therapy Treatment  Patient Details  Name: Luis Yu MRN: [id number] Date of Birth: 1946/03/08 Referring Provider: Claudette Laws, MD  Encounter Date: 08/31/2016      PT End of Session - 08/31/16 1227    Visit Number 21   Number of Visits 32   Date for PT Re-Evaluation 09/22/16   PT Start Time 1117   PT Stop Time 1208   PT Time Calculation (min) 51 min   Activity Tolerance Patient tolerated treatment well   Behavior During Therapy [facility] for tasks assessed/performed      Past Medical History:  Diagnosis Date  . Adjustment disorder with mixed emotional features   . Atrial flutter (HCC)   . CAD (coronary artery disease)   . Depression   . Diabetes mellitus type II, uncontrolled (HCC)   . Diabetic peripheral neuropathy (HCC)   . Erectile dysfunction   . GERD (gastroesophageal reflux disease)   . Herpes zoster   . Hyperlipidemia   . Hypertension   . Obesity   . Osteoarthritis   . Postherpetic neuralgia   . Sleep apnea     Past Surgical History:  Procedure Laterality Date  . CARDIOVERSION N/A 11/10/2015   Procedure: CARDIOVERSION;  Surgeon: Laurey Morale, MD;  Location: [facility] ENDOSCOPY;  Service: Cardiovascular;  Laterality: N/A;  . CARDIOVERSION N/A 02/21/2016   Procedure: CARDIOVERSION;  Surgeon: Lewayne Bunting, MD;  Location: [facility] ENDOSCOPY;  Service: Cardiovascular;  Laterality: N/A;  . CORONARY ARTERY BYPASS GRAFT  1996   eith a LIMA to the LAD, saphenous vein graft to the acute marginal, saphenous vein graft to the PDA and saphenous vein graft to the circumflex.  [name] KNEE ARTHROSCOPY  05/2004   right knee  . REPLACEMENT TOTAL KNEE BILATERAL    . SHOULDER SURGERY      There were no vitals filed for this visit.      Subjective Assessment - 08/31/16 1223    Subjective I'm doing better today.   Currently in Pain?  No/denies                         [facility] Adult PT Treatment/Exercise - 08/31/16 0001      Exercises   Exercises Knee/Hip     Knee/Hip Exercises: Aerobic   Nustep Level 6 x 15 minutes.     Knee/Hip Exercises: Machines for Strengthening   Cybex Knee Extension 10# x 5 minutes.   Cybex Knee Flexion 30# x 5 minutes.             Balance Exercises - 08/31/16 1225      Balance Exercises: Standing   Other Standing Exercises 3 surface balance activites beginning with Rockerboard, BOSU ball and Airex balance pad (13 minutes total).             PT Short Term Goals - 08/10/16 1201      PT SHORT TERM GOAL #2   Title Increase Berg score to 32/56.   Time 4   Period Weeks   Status Achieved           PT Long Term Goals - 07/16/16 0930      PT LONG TERM GOAL #1   Title Independent with an advanced HEP.   Time 8   Period Weeks   Status On-going     PT LONG TERM GOAL #2  Title Increase bilateral LE strength to a solid 4+/5 to increase stability for functional activites.   Time 8   Period Weeks   Status On-going     PT LONG TERM GOAL #3   Title Walk in clinic with FWW 500 feet with supervision only without resting and 02 not going below 93%.   Time 8   Period Weeks   Status On-going     PT LONG TERM GOAL #4   Title Perform a reciprocating stair gait with one railing.   Time 8   Period Weeks   Status On-going     PT LONG TERM GOAL #5   Title Increase Berg score to 44-45/56.   Time 8   Period Weeks   Status On-going               Plan - 08/31/16 1228    Clinical Impression Statement Patient did well today with balance activites today.  He was fatigued follwing treatment today.      Patient will benefit from skilled therapeutic intervention in order to improve the following deficits and impairments:  Abnormal gait, Decreased activity tolerance, Decreased balance, Decreased mobility, Decreased coordination, Decreased strength,  Difficulty walking  Visit Diagnosis: Muscle weakness (generalized)  Unsteadiness on feet     Problem List Patient Active Problem List   Diagnosis Date Noted  . Frontal lobe and executive function deficit following cerebral infarction 07/02/2016  . Neurologic gait disorder   . Coronary artery disease involving coronary bypass graft of native heart without angina pectoris   . Benign essential HTN   . Acute kidney injury (HCC)   . Nausea & vomiting 06/28/2016  . Ischemic stroke of frontal lobe (HCC) 06/28/2016  . Thrombocytopenia (HCC) 06/28/2016  . Renal failure (ARF), acute on chronic (HCC) 06/28/2016  . Stroke (cerebrum) (HCC) 06/28/2016  . Acute CVA (cerebrovascular accident) (HCC) 06/28/2016  . Insomnia 02/02/2016  . Atrial fibrillation (HCC) 10/27/2015  . Uncoordinated movements 02/24/2014  . Unspecified hereditary and idiopathic peripheral neuropathy 02/24/2013  . Low back pain 02/24/2013  . Abnormality of gait 02/09/2013  . Memory loss, short term 12/05/2011  . LUMBAR STRAIN, ACUTE 05/05/2010  . OSA (obstructive sleep apnea) 09/14/2009  . BPH (benign prostatic hyperplasia) 07/27/2009  . CARDIOVASCULAR STUDIES, ABNORMAL 07/27/2009  . CHEST PAIN 06/08/2009  . OSTEOARTHRITIS 05/30/2009  . HEEL PAIN, LEFT 05/24/2009  . Dyspnea on exertion 05/24/2009  . ADJUSTMENT DISORDER WITH MIXED FEATURES 08/25/2008  . Diabetes type 2, uncontrolled (HCC) 02/24/2008  . ERECTILE DYSFUNCTION 02/10/2007  . Hyperlipidemia 10/09/2006  . OBESITY 10/09/2006  . Essential hypertension 10/09/2006  . Coronary atherosclerosis 10/09/2006  . GERD 10/09/2006  . DM (diabetes mellitus) type II uncontrolled with retinopathy 06/10/2006    Semone Orlov, [place] MPT 08/31/2016, 12:38 PM  [name] [address] [place], [place], [address] Phone: [phone]   Fax:  [phone]  Name: Doretha ImusDonald W Bonebrake MRN: [id number] Date of Birth: March 26, 1945

## 2016-09-03 ENCOUNTER — Encounter: Payer: Medicare Other | Admitting: Physical Therapy

## 2016-09-03 NOTE — Addendum Note (Signed)
Addended by: Lincoln MaxinHARRISON, Aleana Fifita F on: 09/03/2016 09:42 AM   Modules accepted: Orders

## 2016-09-04 ENCOUNTER — Other Ambulatory Visit: Payer: Self-pay | Admitting: *Deleted

## 2016-09-04 ENCOUNTER — Ambulatory Visit (INDEPENDENT_AMBULATORY_CARE_PROVIDER_SITE_OTHER): Payer: Medicare Other | Admitting: Orthotics

## 2016-09-04 ENCOUNTER — Ambulatory Visit: Payer: Medicare Other | Admitting: *Deleted

## 2016-09-04 DIAGNOSIS — E1140 Type 2 diabetes mellitus with diabetic neuropathy, unspecified: Secondary | ICD-10-CM | POA: Diagnosis not present

## 2016-09-04 DIAGNOSIS — R2681 Unsteadiness on feet: Secondary | ICD-10-CM

## 2016-09-04 DIAGNOSIS — Z794 Long term (current) use of insulin: Secondary | ICD-10-CM

## 2016-09-04 NOTE — Patient Outreach (Signed)
[facility], telephone call to patient with no answer to [phone] and left voicemail requesting return phone call.  Telephone call to [phone] and no option to leave voicemail.    PLAN Outreach pt next week  Luis ShowsJulie Yu [facility]RNC, BSN [facility] Care Coordinator [phone]

## 2016-09-04 NOTE — Progress Notes (Signed)
Patient  [facility]   The brace fit well and immediately patient's  gait approved.  The brace provided desired m-l stability in frontal/transverse planes.  Patient was able to don and doff brace with minimal difficulty.  Overall patient pleased with fit and functionality of brace. Patient advised to monitor for skin irritation and/or other complications in wearing brace.

## 2016-09-05 ENCOUNTER — Ambulatory Visit: Payer: Medicare Other | Admitting: Physical Therapy

## 2016-09-05 ENCOUNTER — Encounter: Payer: Self-pay | Admitting: Physical Therapy

## 2016-09-05 DIAGNOSIS — R2681 Unsteadiness on feet: Secondary | ICD-10-CM

## 2016-09-05 DIAGNOSIS — I69314 Frontal lobe and executive function deficit following cerebral infarction: Secondary | ICD-10-CM | POA: Diagnosis not present

## 2016-09-05 DIAGNOSIS — Z760 Encounter for issue of repeat prescription: Secondary | ICD-10-CM | POA: Diagnosis not present

## 2016-09-05 DIAGNOSIS — F339 Major depressive disorder, recurrent, unspecified: Secondary | ICD-10-CM | POA: Diagnosis not present

## 2016-09-05 DIAGNOSIS — M6281 Muscle weakness (generalized): Secondary | ICD-10-CM | POA: Diagnosis not present

## 2016-09-05 DIAGNOSIS — R260 Ataxic gait: Secondary | ICD-10-CM | POA: Diagnosis not present

## 2016-09-05 NOTE — Therapy (Signed)
[name] [address] [place], [place], [address] Phone: [phone]   Fax:  [phone]  Physical Therapy Treatment  Patient Details  Name: Luis Yu MRN: [id number] Date of Birth: [birth year] Referring Provider: Claudette Laws, MD  Encounter Date: 09/05/2016      PT End of Session - 09/05/16 1301    Visit Number 22   Number of Visits 32   Date for PT Re-Evaluation 09/22/16   PT Start Time 1228   PT Stop Time 1310   PT Time Calculation (min) 42 min   Equipment Utilized During Treatment Gait belt   Activity Tolerance Patient tolerated treatment well;Patient limited by lethargy   Behavior During Therapy [facility] for tasks assessed/performed      Past Medical History:  Diagnosis Date  . Adjustment disorder with mixed emotional features   . Atrial flutter (HCC)   . CAD (coronary artery disease)   . Depression   . Diabetes mellitus type II, uncontrolled (HCC)   . Diabetic peripheral neuropathy (HCC)   . Erectile dysfunction   . GERD (gastroesophageal reflux disease)   . Herpes zoster   . Hyperlipidemia   . Hypertension   . Obesity   . Osteoarthritis   . Postherpetic neuralgia   . Sleep apnea     Past Surgical History:  Procedure Laterality Date  . CARDIOVERSION N/A 11/10/2015   Procedure: CARDIOVERSION;  Surgeon: Laurey Morale, MD;  Location[facility] ENDOSCOPY;  Service: Cardiovascular;  Laterality: N/A;  . CARDIOVERSION N/A 02/21/2016   Procedure: CARDIOVERSION;  Surgeon: Lewayne Bunting, MD;  Location: [facility] ENDOSCOPY;  Service: Cardiovascular;  Laterality: N/A;  . CORONARY ARTERY BYPASS GRAFT  1996   eith a LIMA to the LAD, saphenous vein graft to the acute marginal, saphenous vein graft to the PDA and saphenous vein graft to the circumflex.  [name] KNEE ARTHROSCOPY  05/2004   right knee  . REPLACEMENT TOTAL KNEE BILATERAL    . SHOULDER SURGERY      There were no vitals filed for this visit.      Subjective Assessment -  09/05/16 1235    Subjective Patient arrived feeling " a liitle off and out of it" per wife, patient reciveved AFO's today yet MPT told them we will try them next visit   Patient is accompained by: Family member   Pertinent History Recent falls.  Peripheral neuropathy. Chronic atrial fib.; bil TKA's.   How long can you walk comfortably? Short distances with a FWW.   Diagnostic tests MRI; CT   Currently in Pain? No/denies                         [facility] Adult PT Treatment/Exercise - 09/05/16 0001      Knee/Hip Exercises: Aerobic   Nustep Level 6 x 15 minutes UE/LE activity, monitored for progression  O2 95% / HR 61 / BP 118/64 after nustep     Knee/Hip Exercises: Machines for Strengthening   Cybex Knee Extension 20# x4min   Cybex Knee Flexion 40# x62min             Balance Exercises - 09/05/16 1255      Balance Exercises: Standing   Standing Eyes Opened Wide (BOA);Solid surface;Foam/compliant surface;4 reps   Standing, One Foot on a Step 6 inch;4 reps;10 secs;Eyes open   Step Ups 6 inch;Forward;UE support 1;UE support 2  2x10   Marching Limitations 2x10   Heel Raises Limitations x10  Toe Raise Limitations x10   Sit to Stand Time x10             PT Short Term Goals - 08/10/16 1201      PT SHORT TERM GOAL #2   Title Increase Berg score to 32/56.   Time 4   Period Weeks   Status Achieved           PT Long Term Goals - 07/16/16 0930      PT LONG TERM GOAL #1   Title Independent with an advanced HEP.   Time 8   Period Weeks   Status On-going     PT LONG TERM GOAL #2   Title Increase bilateral LE strength to a solid 4+/5 to increase stability for functional activites.   Time 8   Period Weeks   Status On-going     PT LONG TERM GOAL #3   Title Walk in clinic with FWW 500 feet with supervision only without resting and 02 not going below 93%.   Time 8   Period Weeks   Status On-going     PT LONG TERM GOAL #4   Title Perform a  reciprocating stair gait with one railing.   Time 8   Period Weeks   Status On-going     PT LONG TERM GOAL #5   Title Increase Berg score to 44-45/56.   Time 8   Period Weeks   Status On-going               Plan - 09/05/16 1306    Clinical Impression Statement Patient tolerated treatment fairly well yet very lethargic today and verbal education to stay on task with exercises. Patient able to complete strength and balance activities with rest breaks. O2 96 %, HR 62, BP 137/ 72 post treatment. Patient required CGA with gait belt for safety. Patient has lOB with balance activities and required assist to help recover. No falls reported. Patient goals ongoing due to balance and strength deficts.    Clinical Impairments Affecting Rehab Potential Poor balance.  H/o CVA.   PT Frequency 2x / week   PT Duration 8 weeks   PT Treatment/Interventions ADLs/Self Care Home Management;Functional mobility training;Stair training;Gait training;Therapeutic activities;Therapeutic exercise;Balance training;Neuromuscular re-education;Patient/family education   PT Next Visit Plan Please monitor BP; HR and 02 sat.  Bilateral LE strengthening to include core exercises.  Gait and balance activites.      Consulted and Agree with Plan of Care Patient      Patient will benefit from skilled therapeutic intervention in order to improve the following deficits and impairments:  Abnormal gait, Decreased activity tolerance, Decreased balance, Decreased mobility, Decreased coordination, Decreased strength, Difficulty walking  Visit Diagnosis: Muscle weakness (generalized)  Unsteadiness on feet     Problem List Patient Active Problem List   Diagnosis Date Noted  . Frontal lobe and executive function deficit following cerebral infarction 07/02/2016  . Neurologic gait disorder   . Coronary artery disease involving coronary bypass graft of native heart without angina pectoris   . Benign essential HTN   .  Acute kidney injury (HCC)   . Nausea & vomiting 06/28/2016  . Ischemic stroke of frontal lobe (HCC) 06/28/2016  . Thrombocytopenia (HCC) 06/28/2016  . Renal failure (ARF), acute on chronic (HCC) 06/28/2016  . Stroke (cerebrum) (HCC) 06/28/2016  . Acute CVA (cerebrovascular accident) (HCC) 06/28/2016  . Insomnia 02/02/2016  . Atrial fibrillation (HCC) 10/27/2015  . Uncoordinated movements 02/24/2014  . Unspecified hereditary and  idiopathic peripheral neuropathy 02/24/2013  . Low back pain 02/24/2013  . Abnormality of gait 02/09/2013  . Memory loss, short term 12/05/2011  . LUMBAR STRAIN, ACUTE 05/05/2010  . OSA (obstructive sleep apnea) 09/14/2009  . BPH (benign prostatic hyperplasia) 07/27/2009  . CARDIOVASCULAR STUDIES, ABNORMAL 07/27/2009  . CHEST PAIN 06/08/2009  . OSTEOARTHRITIS 05/30/2009  . HEEL PAIN, LEFT 05/24/2009  . Dyspnea on exertion 05/24/2009  . ADJUSTMENT DISORDER WITH MIXED FEATURES 08/25/2008  . Diabetes type 2, uncontrolled (HCC) 02/24/2008  . ERECTILE DYSFUNCTION 02/10/2007  . Hyperlipidemia 10/09/2006  . OBESITY 10/09/2006  . Essential hypertension 10/09/2006  . Coronary atherosclerosis 10/09/2006  . GERD 10/09/2006  . DM (diabetes mellitus) type II uncontrolled with retinopathy 06/10/2006    Trellis Guirguis P, PTA 09/05/2016, 1:12 PM  [facility] [address] [place], [place], [address] Phone: [phone]   Fax:  [phone]  Name: Luis Yu MRN: [id number] Date of Birth: 1945/06/19

## 2016-09-06 ENCOUNTER — Telehealth: Payer: Self-pay | Admitting: Adult Health

## 2016-09-06 NOTE — Telephone Encounter (Signed)
Is this ok?

## 2016-09-06 NOTE — Telephone Encounter (Signed)
Pt wife would like to increase mirtazapine 30 mg to 45 mg. Pt wife will cut one pill in half. Pt wife would like the ok to proceed

## 2016-09-06 NOTE — Telephone Encounter (Signed)
That is fine.   Can we also get him scheduled with Dr. Caryl NeverBurchette for diabetic shoes

## 2016-09-06 NOTE — Telephone Encounter (Signed)
Patient's wife has been notified and verbalized understanding.  Patient has been scheduled with Dr. Caryl NeverBurchette.

## 2016-09-07 ENCOUNTER — Ambulatory Visit: Payer: Medicare Other | Admitting: *Deleted

## 2016-09-07 VITALS — HR 74

## 2016-09-07 DIAGNOSIS — Z760 Encounter for issue of repeat prescription: Secondary | ICD-10-CM

## 2016-09-07 DIAGNOSIS — R260 Ataxic gait: Secondary | ICD-10-CM

## 2016-09-07 DIAGNOSIS — M6281 Muscle weakness (generalized): Secondary | ICD-10-CM

## 2016-09-07 DIAGNOSIS — R2681 Unsteadiness on feet: Secondary | ICD-10-CM | POA: Diagnosis not present

## 2016-09-07 DIAGNOSIS — R2689 Other abnormalities of gait and mobility: Secondary | ICD-10-CM

## 2016-09-07 DIAGNOSIS — I69314 Frontal lobe and executive function deficit following cerebral infarction: Secondary | ICD-10-CM

## 2016-09-07 DIAGNOSIS — F339 Major depressive disorder, recurrent, unspecified: Secondary | ICD-10-CM

## 2016-09-07 NOTE — Therapy (Signed)
[name] [name], [name], [address] Phone: [phone]   Fax:  [phone]  Physical Therapy Treatment  Patient Details  Name: Luis Yu MRN: [id number] Date of Birth: 1945/11/20 Referring Provider: Alysia Penna, MD  Encounter Date: 09/07/2016      PT End of Session - 09/07/16 1130    Visit Number 23   Number of Visits 32   Date for PT Re-Evaluation 09/22/16   PT Start Time 1115   PT Stop Time 8088   PT Time Calculation (min) 50 min      Past Medical History:  Diagnosis Date  . Adjustment disorder with mixed emotional features   . Atrial flutter ([place])   . CAD (coronary artery disease)   . Depression   . Diabetes mellitus type II, uncontrolled ([place])   . Diabetic peripheral neuropathy ([place])   . Erectile dysfunction   . GERD (gastroesophageal reflux disease)   . Herpes zoster   . Hyperlipidemia   . Hypertension   . Obesity   . Osteoarthritis   . Postherpetic neuralgia   . Sleep apnea     Past Surgical History:  Procedure Laterality Date  . CARDIOVERSION N/A 11/10/2015   Procedure: CARDIOVERSION;  Surgeon: Larey Dresser, MD;  Location: [place];  Service: Cardiovascular;  Laterality: N/A;  . CARDIOVERSION N/A 02/21/2016   Procedure: CARDIOVERSION;  Surgeon: Lelon Perla, MD;  Location[facility] ENDOSCOPY;  Service: Cardiovascular;  Laterality: N/A;  . CORONARY ARTERY BYPASS GRAFT  1996   eith a LIMA to the LAD, saphenous vein graft to the acute marginal, saphenous vein graft to the PDA and saphenous vein graft to the circumflex.  [name] KNEE ARTHROSCOPY  05/2004   right knee  . REPLACEMENT TOTAL KNEE BILATERAL    . SHOULDER SURGERY      Vitals:   09/07/16 1132  Pulse: 74  SpO2: 96%        Subjective Assessment - 09/07/16 1127    Subjective Patient arrived feeling " a liitle off and out of it" all week as per wife,  Pt says he has no energy and balance is off.   Patient is accompained by:  Family member   Pertinent History Recent falls.  Peripheral neuropathy. Chronic atrial fib.; bil TKA's.   How long can you walk comfortably? Short distances with a FWW.   Diagnostic tests MRI; CT   Currently in Pain? No/denies                         [facility] Adult PT Treatment/Exercise - 09/07/16 0001      Exercises   Exercises Knee/Hip     Knee/Hip Exercises: Aerobic   Nustep Level 6 x 15 minutes UE/LE activity, monitored for progression  O2 96% / HR 74     Knee/Hip Exercises: Machines for Strengthening   Cybex Knee Extension 20# 3 x fatigue    Cybex Knee Flexion 40#  3 x fatigue  O2 94%, HR 74 BPM     Knee/Hip Exercises: Standing   Rocker Board 5 minutes  balance with CGA             Balance Exercises - 09/07/16 1133      Balance Exercises: Standing   Standing Eyes Opened Wide (BOA);Solid surface;Foam/compliant surface  UE reaches 2x 20 HR 79 O2 88%   Standing, One Foot on a Step 6 inch;Eyes open  2x20 with 1 HHA today   Marching  Limitations 2x10   Heel Raises Limitations x10   Toe Raise Limitations x10   Sit to Stand Time x10             PT Short Term Goals - 08/10/16 1201      PT SHORT TERM GOAL #2   Title Increase Berg score to 32/56.   Time 4   Period Weeks   Status Achieved           PT Long Term Goals - 07/16/16 0930      PT LONG TERM GOAL #1   Title Independent with an advanced HEP.   Time 8   Period Weeks   Status On-going     PT LONG TERM GOAL #2   Title Increase bilateral LE strength to a solid 4+/5 to increase stability for functional activites.   Time 8   Period Weeks   Status On-going     PT LONG TERM GOAL #3   Title Walk in clinic with FWW 500 feet with supervision only without resting and 02 not going below 93%.   Time 8   Period Weeks   Status On-going     PT LONG TERM GOAL #4   Title Perform a reciprocating stair gait with one railing.   Time 8   Period Weeks   Status On-going     PT LONG TERM  GOAL #5   Title Increase Berg score to 44-45/56.   Time 8   Period Weeks   Status On-going               Plan - 09/07/16 1201    Clinical Impression Statement Pt arrived today doing about the same and was still lethargic and SOB. He was able to perform strengthening and balance act.'s today, but needed more therapuetic rest periods today.. O2 dropped to  88% at one period during standing balance exs, but recovered quickly.He required CGA during all balance act.'s today. Wife reports it.'s been a rough week for him for some reason No LTGs met this week due to deficits.   PT Frequency 2x / week   PT Duration 8 weeks   PT Treatment/Interventions ADLs/Self Care Home Management;Functional mobility training;Stair training;Gait training;Therapeutic activities;Therapeutic exercise;Balance training;Neuromuscular re-education;Patient/family education   PT Next Visit Plan Please monitor BP; HR and 02 sat.  Bilateral LE strengthening to include core exercises.  Gait and balance activites.      Consulted and Agree with Plan of Care Patient   Family Member Consulted Wife      Patient will benefit from skilled therapeutic intervention in order to improve the following deficits and impairments:  Abnormal gait, Decreased activity tolerance, Decreased balance, Decreased mobility, Decreased coordination, Decreased strength, Difficulty walking  Visit Diagnosis: Muscle weakness (generalized)  Unsteadiness on feet  Frontal lobe and executive function deficit following cerebral infarction  Medication refill  Depression, recurrent (HCC)  Ataxic gait  Other abnormalities of gait and mobility     Problem List Patient Active Problem List   Diagnosis Date Noted  . Frontal lobe and executive function deficit following cerebral infarction 07/02/2016  . Neurologic gait disorder   . Coronary artery disease involving coronary bypass graft of native heart without angina pectoris   . Benign  essential HTN   . Acute kidney injury ([place])   . Nausea & vomiting 06/28/2016  . Ischemic stroke of frontal lobe ([place]) 06/28/2016  . Thrombocytopenia ([place]) 06/28/2016  . Renal failure (ARF), acute on chronic (HCC) 06/28/2016  . Stroke (  cerebrum) ([place]) 06/28/2016  . Acute CVA (cerebrovascular accident) ([place]) 06/28/2016  . Insomnia 02/02/2016  . Atrial fibrillation ([place]) 10/27/2015  . Uncoordinated movements 02/24/2014  . Unspecified hereditary and idiopathic peripheral neuropathy 02/24/2013  . Low back pain 02/24/2013  . Abnormality of gait 02/09/2013  . Memory loss, short term 12/05/2011  . LUMBAR STRAIN, ACUTE 05/05/2010  . OSA (obstructive sleep apnea) 09/14/2009  . BPH (benign prostatic hyperplasia) 07/27/2009  . CARDIOVASCULAR STUDIES, ABNORMAL 07/27/2009  . CHEST PAIN 06/08/2009  . OSTEOARTHRITIS 05/30/2009  . HEEL PAIN, LEFT 05/24/2009  . Dyspnea on exertion 05/24/2009  . ADJUSTMENT DISORDER WITH MIXED FEATURES 08/25/2008  . Diabetes type 2, uncontrolled ([place]) 02/24/2008  . ERECTILE DYSFUNCTION 02/10/2007  . Hyperlipidemia 10/09/2006  . OBESITY 10/09/2006  . Essential hypertension 10/09/2006  . Coronary atherosclerosis 10/09/2006  . GERD 10/09/2006  . DM (diabetes mellitus) type II uncontrolled with retinopathy 06/10/2006    Yarden Hillis,CHRIS, PTA 09/07/2016, 12:18 PM  [facility] [address] [place], [name], [address] Phone: [phone]   Fax:  [phone]  Name: ELRAY DAINS MRN: [id number] Date of Birth: [birth year]

## 2016-09-10 ENCOUNTER — Encounter: Payer: Self-pay | Admitting: Physical Therapy

## 2016-09-10 ENCOUNTER — Other Ambulatory Visit: Payer: Self-pay | Admitting: *Deleted

## 2016-09-10 ENCOUNTER — Encounter: Payer: Self-pay | Admitting: *Deleted

## 2016-09-10 ENCOUNTER — Ambulatory Visit: Payer: Medicare Other | Admitting: Physical Therapy

## 2016-09-10 VITALS — HR 71

## 2016-09-10 DIAGNOSIS — I69314 Frontal lobe and executive function deficit following cerebral infarction: Secondary | ICD-10-CM | POA: Diagnosis not present

## 2016-09-10 DIAGNOSIS — R260 Ataxic gait: Secondary | ICD-10-CM | POA: Diagnosis not present

## 2016-09-10 DIAGNOSIS — Z760 Encounter for issue of repeat prescription: Secondary | ICD-10-CM | POA: Diagnosis not present

## 2016-09-10 DIAGNOSIS — M6281 Muscle weakness (generalized): Secondary | ICD-10-CM

## 2016-09-10 DIAGNOSIS — F339 Major depressive disorder, recurrent, unspecified: Secondary | ICD-10-CM | POA: Diagnosis not present

## 2016-09-10 DIAGNOSIS — R2681 Unsteadiness on feet: Secondary | ICD-10-CM | POA: Diagnosis not present

## 2016-09-10 NOTE — Patient Outreach (Signed)
Telephone call to patient (2nd attempt) to schedule home visit, no answer to [phone], left voicemail requesting return phone call.  No answer to [phone] and no option to leave voicemail.  Rn CM mailed letter to pt home requesting return phone call.  Irving ShowsJulie Farmer [facility] Mary[facility]NC, BSN [facility] Community Care Coordinator [phone]

## 2016-09-10 NOTE — Therapy (Signed)
[name] [address] [place], [place], [address] Phone: [phone]   Fax:  [phone]  Physical Therapy Treatment  Patient Details  Name: Luis Yu MRN: [id number] Date of Birth: 10-23-1945 Referring Provider: Claudette Laws, MD  Encounter Date: 09/10/2016      PT End of Session - 09/10/16 1517    Visit Number 24   Number of Visits 32   Date for PT Re-Evaluation 09/22/16   PT Start Time 1515   PT Stop Time 1558   PT Time Calculation (min) 43 min   Activity Tolerance Patient tolerated treatment well   Behavior During Therapy [facility] for tasks assessed/performed      Past Medical History:  Diagnosis Date  . Adjustment disorder with mixed emotional features   . Atrial flutter (HCC)   . CAD (coronary artery disease)   . Depression   . Diabetes mellitus type II, uncontrolled (HCC)   . Diabetic peripheral neuropathy (HCC)   . Erectile dysfunction   . GERD (gastroesophageal reflux disease)   . Herpes zoster   . Hyperlipidemia   . Hypertension   . Obesity   . Osteoarthritis   . Postherpetic neuralgia   . Sleep apnea     Past Surgical History:  Procedure Laterality Date  . CARDIOVERSION N/A 11/10/2015   Procedure: CARDIOVERSION;  Surgeon: Laurey Morale, MD;  [facility]: [facility] END[facility]COPY;  Service: Cardiovascular;  Laterality: N/A;  . CARDIOVERSION N/A 02/21/2016   Procedure: CARDIOVERSION;  Surgeon: Lewayne Bunting, MD;  Location: [facility] ENDOSCOPY;  Service: Cardiovascular;  Laterality: N/A;  . CORONARY ARTERY BYPASS GRAFT  1996   eith a LIMA to the LAD, saphenous vein graft to the acute marginal, saphenous vein graft to the PDA and saphenous vein graft to the circumflex.  [name] KNEE ARTHROSCOPY  05/2004   right knee  . REPLACEMENT TOTAL KNEE BILATERAL    . SHOULDER SURGERY      Vitals:   09/10/16 1517  Pulse: 71  SpO2: 94%        Subjective Assessment - 09/10/16 1516    Subjective Patient states that he is feeling  better today.    Patient is accompained by: Family member  Wife   Pertinent History Recent falls.  Peripheral neuropathy. Chronic atrial fib.; bil TKA's.   How long can you walk comfortably? Short distances with a FWW.   Diagnostic tests MRI; CT   Currently in Pain? No/denies            [facility] PT Assessment - 09/10/16 0001      Assessment   Medical Diagnosis Left CVA.   Onset Date/Surgical Date 07/07/16     Precautions   Precautions Fall     Restrictions   Weight Bearing Restrictions No                     [facility] Adult PT Treatment/Exercise - 09/10/16 0001      Knee/Hip Exercises: Aerobic   Nustep L6 x15 min     Knee/Hip Exercises: Machines for Strengthening   Cybex Knee Extension 20# x4 min    Cybex Knee Flexion 40# x5 min             Balance Exercises - 09/10/16 1545      Balance Exercises: Standing   Step Ups Forward;6 inch;UE support 1  x20 reps each (93% following LLE first trial and 96% second)   Marching Limitations x20 reps   Heel Raises Limitations x10  Toe Raise Limitations x10   Other Standing Exercises x50 reps of toe touch  to 6" step with one UE support             PT Short Term Goals - 09/10/16 1517      PT SHORT TERM GOAL #1   Title Independent with an initial HEP.   Time 4   Period Weeks   Status Achieved     PT SHORT TERM GOAL #2   Title Increase Berg score to 32/56.   Time 4   Period Weeks   Status Achieved           PT Long Term Goals - 07/16/16 0930      PT LONG TERM GOAL #1   Title Independent with an advanced HEP.   Time 8   Period Weeks   Status On-going     PT LONG TERM GOAL #2   Title Increase bilateral LE strength to a solid 4+/5 to increase stability for functional activites.   Time 8   Period Weeks   Status On-going     PT LONG TERM GOAL #3   Title Walk in clinic with FWW 500 feet with supervision only without resting and 02 not going below 93%.   Time 8   Period Weeks   Status  On-going     PT LONG TERM GOAL #4   Title Perform a reciprocating stair gait with one railing.   Time 8   Period Weeks   Status On-going     PT LONG TERM GOAL #5   Title Increase Berg score to 44-45/56.   Time 8   Period Weeks   Status On-going               Plan - 09/10/16 1606    Clinical Impression Statement Patient presented in clinic today with reports of feeling better and appearing more eager to begin treatment. Patient able to complete all exercises although frequent and quick rest breaks required between each exercise especially with standing balance exercises due to fatigue and SOB. Poor R foot clearance noted today with ambulation secondary to decreased R hip and knee flexion with decreased dorsiflexion. Standing balance activities focused on improving hip flexion as well as improving SLS on BLE. Vitals taken following treatment were 98%, 81 bpm.   Clinical Impairments Affecting Rehab Potential Poor balance.  H/o CVA.   PT Frequency 2x / week   PT Duration 8 weeks   PT Treatment/Interventions ADLs/Self Care Home Management;Functional mobility training;Stair training;Gait training;Therapeutic activities;Therapeutic exercise;Balance training;Neuromuscular re-education;Patient/family education   PT Next Visit Plan Please monitor BP; HR and 02 sat.  Bilateral LE strengthening to include core exercises.  Gait and balance activites.      Consulted and Agree with Plan of Care Patient   Family Member Consulted Wife      Patient will benefit from skilled therapeutic intervention in order to improve the following deficits and impairments:  Abnormal gait, Decreased activity tolerance, Decreased balance, Decreased mobility, Decreased coordination, Decreased strength, Difficulty walking  Visit Diagnosis: Muscle weakness (generalized)  Unsteadiness on feet     Problem List Patient Active Problem List   Diagnosis Date Noted  . Frontal lobe and executive function deficit  following cerebral infarction 07/02/2016  . Neurologic gait disorder   . Coronary artery disease involving coronary bypass graft of native heart without angina pectoris   . Benign essential HTN   . Acute kidney injury (HCC)   . Nausea & vomiting  06/28/2016  . Ischemic stroke of frontal lobe (HCC) 06/28/2016  . Thrombocytopenia (HCC) 06/28/2016  . Renal failure (ARF), acute on chronic (HCC) 06/28/2016  . Stroke (cerebrum) (HCC) 06/28/2016  . Acute CVA (cerebrovascular accident) (HCC) 06/28/2016  . Insomnia 02/02/2016  . Atrial fibrillation (HCC) 10/27/2015  . Uncoordinated movements 02/24/2014  . Unspecified hereditary and idiopathic peripheral neuropathy 02/24/2013  . Low back pain 02/24/2013  . Abnormality of gait 02/09/2013  . Memory loss, short term 12/05/2011  . LUMBAR STRAIN, ACUTE 05/05/2010  . OSA (obstructive sleep apnea) 09/14/2009  . BPH (benign prostatic hyperplasia) 07/27/2009  . CARDIOVASCULAR STUDIES, ABNORMAL 07/27/2009  . CHEST PAIN 06/08/2009  . OSTEOARTHRITIS 05/30/2009  . HEEL PAIN, LEFT 05/24/2009  . Dyspnea on exertion 05/24/2009  . ADJUSTMENT DISORDER WITH MIXED FEATURES 08/25/2008  . Diabetes type 2, uncontrolled (HCC) 02/24/2008  . ERECTILE DYSFUNCTION 02/10/2007  . Hyperlipidemia 10/09/2006  . OBESITY 10/09/2006  . Essential hypertension 10/09/2006  . Coronary atherosclerosis 10/09/2006  . GERD 10/09/2006  . DM (diabetes mellitus) type II uncontrolled with retinopathy 06/10/2006    Evelene Croon, PTA 09/10/2016, 4:12 PM  [name] [address] [place], [place], [address] Phone: [phone]   Fax:  [phone]  Name: Luis Yu MRN: [id number] Date of Birth: [birth year]

## 2016-09-11 ENCOUNTER — Encounter: Payer: Self-pay | Admitting: Family Medicine

## 2016-09-11 ENCOUNTER — Ambulatory Visit (INDEPENDENT_AMBULATORY_CARE_PROVIDER_SITE_OTHER): Payer: Medicare Other | Admitting: Family Medicine

## 2016-09-11 VITALS — BP 140/90 | HR 63 | Temp 98.3°F | Wt 229.3 lb

## 2016-09-11 DIAGNOSIS — I482 Chronic atrial fibrillation, unspecified: Secondary | ICD-10-CM

## 2016-09-11 DIAGNOSIS — Z794 Long term (current) use of insulin: Secondary | ICD-10-CM | POA: Diagnosis not present

## 2016-09-11 DIAGNOSIS — I10 Essential (primary) hypertension: Secondary | ICD-10-CM

## 2016-09-11 DIAGNOSIS — E1165 Type 2 diabetes mellitus with hyperglycemia: Secondary | ICD-10-CM | POA: Diagnosis not present

## 2016-09-11 DIAGNOSIS — IMO0002 Reserved for concepts with insufficient information to code with codable children: Secondary | ICD-10-CM

## 2016-09-11 DIAGNOSIS — I639 Cerebral infarction, unspecified: Secondary | ICD-10-CM | POA: Diagnosis not present

## 2016-09-11 DIAGNOSIS — E1140 Type 2 diabetes mellitus with diabetic neuropathy, unspecified: Secondary | ICD-10-CM | POA: Diagnosis not present

## 2016-09-11 DIAGNOSIS — I25810 Atherosclerosis of coronary artery bypass graft(s) without angina pectoris: Secondary | ICD-10-CM

## 2016-09-11 NOTE — Progress Notes (Signed)
Subjective:     Patient ID: Luis Yu, male   DOB: April 23, 1945, 71 y.o.   MRN: [id number]  HPI   Patient seen for routine medical follow-up. He is followed by our nurse practitioner and has complex medical history. He has type 2 diabetes which he's had for at least 15 years and is followed also by [facility] and is trying to get diabetic shoes covered. Part of that requirement is that he be seen by supervising physician.  His diabetes has been fairly well controlled. Last A1c was up to 7.6%. He is taking his NovoLog somewhat inconsistently but other medications as instructed. No recent hypoglycemic symptoms.  He has history of some neuropathy. He has multiple chronic problems including history of stroke, hypertension, CAD, atrial fibrillation, obstructive sleep apnea, GERD, BPH, hyperlipidemia. He is on anticoagulation with Eliquis  Unstable gait and uses a walker. His wife is frustrated that he is not using this consistently 100% of time.  Past Medical History:  Diagnosis Date  . Adjustment disorder with mixed emotional features   . Atrial flutter (HCC)   . CAD (coronary artery disease)   . Depression   . Diabetes mellitus type II, uncontrolled (HCC)   . Diabetic peripheral neuropathy (HCC)   . Erectile dysfunction   . GERD (gastroesophageal reflux disease)   . Herpes zoster   . Hyperlipidemia   . Hypertension   . Obesity   . Osteoarthritis   . Postherpetic neuralgia   . Sleep apnea    Past Surgical History:  Procedure Laterality Date  . CARDIOVERSION N/A 11/10/2015   Procedure: CARDIOVERSION;  Surgeon: Laurey Moralealton S McLean, MD;  [facility];  Service: Cardiovascular;  Laterality: N/A;  . CARDIOVERSION N/A 02/21/2016   Procedure: CARDIOVERSION;  Surgeon: Lewayne BuntingBrian S Crenshaw, MD;  [facility];  Service: Cardiovascular;  Laterality: N/A;  . CORONARY ARTERY BYPASS GRAFT  1996   eith a LIMA to the LAD, saphenous vein graft to the acute marginal, saphenous vein  graft to the PDA and saphenous vein graft to the circumflex.  [name]. KNEE ARTHROSCOPY  05/2004   right knee  . REPLACEMENT TOTAL KNEE BILATERAL    . SHOULDER SURGERY      reports that he has never smoked. He has never used smokeless tobacco. He reports that he drinks about 1.2 oz of alcohol per week . He reports that he does not use drugs. family history includes Coronary artery disease in his father and mother; Stroke in his brother. No Known Allergies   Review of Systems  Constitutional: Negative for unexpected weight change.  Eyes: Negative for visual disturbance.  Respiratory: Negative for cough, chest tightness and shortness of breath.   Cardiovascular: Negative for chest pain, palpitations and leg swelling.  Neurological: Negative for dizziness, syncope, weakness, light-headedness and headaches.       Objective:   Physical Exam  Constitutional: He is oriented to person, place, and time. He appears well-developed and well-nourished.  HENT:  Right Ear: External ear normal.  Left Ear: External ear normal.  Mouth/Throat: Oropharynx is clear and moist.  Eyes: Pupils are equal, round, and reactive to light.  Neck: Neck supple. No thyromegaly present.  Cardiovascular: Normal rate and regular rhythm.   Pulmonary/Chest: Effort normal and breath sounds normal. No respiratory distress. He has no wheezes. He has no rales.  Musculoskeletal: He exhibits no edema.  Neurological: He is alert and oriented to person, place, and time.  Skin:  No calluses. He has impairment  with monofilament testing. Both feet are warm to touch with good capillary refill. No lesions.       Assessment:     #1 type 2 diabetes-history of fair control but somewhat worsening over the past few months. This may be in part due to weight gain after he started Remeron  #2 history of CAD  #3 history of atrial fibrillation  #4 dyslipidemia    Plan:     -Continue close follow-up with his primary -We do feel patient  would benefit from diabetic shoes. He has type 2 diabetes and is under comprehensive plan to manage that. -He is encouraged to use walker at all times with ambulation.    Kristian Covey MD [facility]

## 2016-09-11 NOTE — Patient Instructions (Signed)
[facility]

## 2016-09-12 ENCOUNTER — Encounter: Payer: Medicare Other | Admitting: Physical Therapy

## 2016-09-13 ENCOUNTER — Ambulatory Visit: Payer: Medicare Other | Admitting: Physical Therapy

## 2016-09-13 ENCOUNTER — Encounter: Payer: Self-pay | Admitting: Physical Therapy

## 2016-09-13 ENCOUNTER — Other Ambulatory Visit: Payer: Self-pay | Admitting: *Deleted

## 2016-09-13 DIAGNOSIS — M6281 Muscle weakness (generalized): Secondary | ICD-10-CM | POA: Diagnosis not present

## 2016-09-13 DIAGNOSIS — Z760 Encounter for issue of repeat prescription: Secondary | ICD-10-CM | POA: Diagnosis not present

## 2016-09-13 DIAGNOSIS — R2681 Unsteadiness on feet: Secondary | ICD-10-CM

## 2016-09-13 DIAGNOSIS — I69314 Frontal lobe and executive function deficit following cerebral infarction: Secondary | ICD-10-CM | POA: Diagnosis not present

## 2016-09-13 DIAGNOSIS — R260 Ataxic gait: Secondary | ICD-10-CM | POA: Diagnosis not present

## 2016-09-13 DIAGNOSIS — F339 Major depressive disorder, recurrent, unspecified: Secondary | ICD-10-CM | POA: Diagnosis not present

## 2016-09-13 NOTE — Therapy (Signed)
[name] [address] [place], [place], [address] Phone: [phone]   Fax:  [phone]  Physical Therapy Treatment  Patient Details  Name: Luis ImusDonald W Termini MRN: [id number] Date of Birth: 10/01/1945 Referring Provider: Claudette LawsAndrew Kirsteins, MD  Encounter Date: 09/13/2016      PT End of Session - 09/13/16 1405    Visit Number 25   Number of Visits 32   Date for PT Re-Evaluation 09/22/16   PT Start Time 1345   PT Stop Time 1429   PT Time Calculation (min) 44 min   Equipment Utilized During Treatment Gait belt   Activity Tolerance Patient tolerated treatment well   Behavior [facility] for tasks assessed/performed      Past Medical History:  Diagnosis Date  . Adjustment disorder with mixed emotional features   . Atrial flutter (HCC)   . CAD (coronary artery disease)   . Depression   . Diabetes mellitus type II, uncontrolled (HCC)   . Diabetic peripheral neuropathy (HCC)   . Erectile dysfunction   . GERD (gastroesophageal reflux disease)   . Herpes zoster   . Hyperlipidemia   . Hypertension   . Obesity   . Osteoarthritis   . Postherpetic neuralgia   . Sleep apnea     Past Surgical History:  Procedure Laterality Date  . CARDIOVERSION N/A 11/10/2015   Procedure: CARDIOVERSION;  Surgeon: Laurey Moralealton S McLean, MD;  [facility];  Service: Cardiovascular;  Laterality: N/A;  . CARDIOVERSION N/A 02/21/2016   Procedure: CARDIOVERSION;  Surgeon: Lewayne BuntingBrian S Crenshaw, MD;  [facility];  Service: Cardiovascular;  Laterality: N/A;  . CORONARY ARTERY BYPASS GRAFT  1996   eith a LIMA to the LAD, saphenous vein graft to the acute marginal, saphenous vein graft to the PDA and saphenous vein graft to the circumflex.  [name]. KNEE ARTHROSCOPY  05/2004   right knee  . REPLACEMENT TOTAL KNEE BILATERAL    . SHOULDER SURGERY      There were no vitals filed for this visit.      Subjective Assessment - 09/13/16 1351    Subjective  Patient reported doing good after last treatment and ok today   Patient is accompained by: Family member   Pertinent History Recent falls.  Peripheral neuropathy. Chronic atrial fib.; bil TKA's.   How long can you walk comfortably? Short distances with a FWW.   Diagnostic tests MRI; CT   Currently in Pain? No/denies                         [facility] Adult PT Treatment/Exercise - 09/13/16 0001      Ambulation/Gait   Ambulation/Gait Yes   Ambulation/Gait Assistance 6: Modified independent (Device/Increase time)   Ambulation Distance (Feet) 170 Feet   Assistive device 4-wheeled walker   Gait Pattern Decreased step length - right;Decreased step length - left;Decreased stride length;Decreased hip/knee flexion - right;Decreased hip/knee flexion - left;Decreased dorsiflexion - right;Decreased dorsiflexion - left;Shuffle;Ataxic;Trunk flexed;Poor foot clearance - left;Poor foot clearance - right   Ambulation Surface Level   Gait Comments vitals increased after yet WNL after rest     Knee/Hip Exercises: Aerobic   Nustep L6 x15 min, monitored for progression     Knee/Hip Exercises: Machines for Strengthening   Cybex Knee Extension 20# x4 min    Cybex Knee Flexion 40# x 4 min             Balance Exercises - 09/13/16 1414  Balance Exercises: Standing   Standing Eyes Opened Wide (BOA);Solid surface;4 reps   Marching Limitations x20 reps   Heel Raises Limitations x10   Toe Raise Limitations x10   Sit to Stand Time x10             PT Short Term Goals - 09/10/16 1517      PT SHORT TERM GOAL #1   Title Independent with an initial HEP.   Time 4   Period Weeks   Status Achieved     PT SHORT TERM GOAL #2   Title Increase Berg score to 32/56.   Time 4   Period Weeks   Status Achieved           PT Long Term Goals - 07/16/16 0930      PT LONG TERM GOAL #1   Title Independent with an advanced HEP.   Time 8   Period Weeks   Status On-going     PT  LONG TERM GOAL #2   Title Increase bilateral LE strength to a solid 4+/5 to increase stability for functional activites.   Time 8   Period Weeks   Status On-going     PT LONG TERM GOAL #3   Title Walk in clinic with FWW 500 feet with supervision only without resting and 02 not going below 93%.   Time 8   Period Weeks   Status On-going     PT LONG TERM GOAL #4   Title Perform a reciprocating stair gait with one railing.   Time 8   Period Weeks   Status On-going     PT LONG TERM GOAL #5   Title Increase Berg score to 44-45/56.   Time 8   Period Weeks   Status On-going               Plan - 09/13/16 1407    Clinical Impression Statement Patient tolerated treatment fair today. Patient able to complete all exercises with some rest breaks due to fatigue. Patient has ongoing difficulty with standing exercises due to fatigue and LOB. Patient able to perform gait training today with 170 feet yet increased fatigue after. Patient LTG's ongoing due to strength and balance deficts. Patient's wife reported no follow throgh at home with no exercises.  Vitals WNL today.   Clinical Impairments Affecting Rehab Potential Poor balance.  H/o CVA.   PT Frequency 2x / week   PT Duration 8 weeks   PT Treatment/Interventions ADLs/Self Care Home Management;Functional mobility training;Stair training;Gait training;Therapeutic activities;Therapeutic exercise;Balance training;Neuromuscular re-education;Patient/family education   PT Next Visit Plan Please monitor BP; HR and 02 sat.  Bilateral LE strengthening to include core exercises.  Gait and balance activites.      Consulted and Agree with Plan of Care Patient      Patient will benefit from skilled therapeutic intervention in order to improve the following deficits and impairments:  Abnormal gait, Decreased activity tolerance, Decreased balance, Decreased mobility, Decreased coordination, Decreased strength, Difficulty walking  Visit  Diagnosis: Muscle weakness (generalized)  Unsteadiness on feet     Problem List Patient Active Problem List   Diagnosis Date Noted  . Frontal lobe and executive function deficit following cerebral infarction 07/02/2016  . Neurologic gait disorder   . Coronary artery disease involving coronary bypass graft of native heart without angina pectoris   . Benign essential HTN   . Acute kidney injury (HCC)   . Nausea & vomiting 06/28/2016  . Ischemic stroke of frontal lobe (  HCC) 06/28/2016  . Thrombocytopenia (HCC) 06/28/2016  . Renal failure (ARF), acute on chronic (HCC) 06/28/2016  . Stroke (cerebrum) (HCC) 06/28/2016  . Acute CVA (cerebrovascular accident) (HCC) 06/28/2016  . Insomnia 02/02/2016  . Atrial fibrillation (HCC) 10/27/2015  . Uncoordinated movements 02/24/2014  . Unspecified hereditary and idiopathic peripheral neuropathy 02/24/2013  . Low back pain 02/24/2013  . Abnormality of gait 02/09/2013  . Memory loss, short term 12/05/2011  . LUMBAR STRAIN, ACUTE 05/05/2010  . OSA (obstructive sleep apnea) 09/14/2009  . BPH (benign prostatic hyperplasia) 07/27/2009  . CARDIOVASCULAR STUDIES, ABNORMAL 07/27/2009  . CHEST PAIN 06/08/2009  . OSTEOARTHRITIS 05/30/2009  . HEEL PAIN, LEFT 05/24/2009  . Dyspnea on exertion 05/24/2009  . ADJUSTMENT DISORDER WITH MIXED FEATURES 08/25/2008  . Diabetes type 2, uncontrolled (HCC) 02/24/2008  . ERECTILE DYSFUNCTION 02/10/2007  . Hyperlipidemia 10/09/2006  . OBESITY 10/09/2006  . Essential hypertension 10/09/2006  . Coronary atherosclerosis 10/09/2006  . GERD 10/09/2006  . DM (diabetes mellitus) type II uncontrolled with retinopathy 06/10/2006    Garett Tetzloff P, PTA 09/13/2016, 2:30 PM  [facility] [address] [place], [place], [address] Phone: [phone]   Fax:  [phone]  Name: ELIZAR ALPERN MRN: [id number] Date of Birth: [birth year]

## 2016-09-13 NOTE — Patient Outreach (Signed)
[facility] Care Management  09/13/2016  Luis ImusDonald W Yu 09/13/1945 [id number]   CSW attempted to reach patient/wife, Luis Yu to follow-up on companion care services patient was agreeable to, wife just had to touch base with Luis Yu at [employer]. No answer at home phone number. CSW left voicemail for patient's wife, Luis Yu on her cellphone (phone #: [phone]). CSW will await call back from patient's wife or will follow-up in 2 weeks.    Lincoln MaxinKelly Hashem Goynes, LCSW [facility]  Clinical Social Worker cell #: [phone]

## 2016-09-14 ENCOUNTER — Ambulatory Visit: Payer: Medicare Other | Admitting: *Deleted

## 2016-09-14 VITALS — HR 67

## 2016-09-14 DIAGNOSIS — R2681 Unsteadiness on feet: Secondary | ICD-10-CM | POA: Diagnosis not present

## 2016-09-14 DIAGNOSIS — I69314 Frontal lobe and executive function deficit following cerebral infarction: Secondary | ICD-10-CM

## 2016-09-14 DIAGNOSIS — M6281 Muscle weakness (generalized): Secondary | ICD-10-CM

## 2016-09-14 DIAGNOSIS — Z760 Encounter for issue of repeat prescription: Secondary | ICD-10-CM | POA: Diagnosis not present

## 2016-09-14 DIAGNOSIS — F339 Major depressive disorder, recurrent, unspecified: Secondary | ICD-10-CM | POA: Diagnosis not present

## 2016-09-14 DIAGNOSIS — R260 Ataxic gait: Secondary | ICD-10-CM | POA: Diagnosis not present

## 2016-09-14 NOTE — Therapy (Signed)
[name] [address] [place], [place], [address] Phone: [phone]   Fax:  [phone]  Physical Therapy Treatment  Patient Details  Name: Luis Yu MRN: [id number] Date of Birth: 12/18/1945 Referring Provider: Claudette Laws, MD  Encounter Date: 09/14/2016      PT End of Session - 09/14/16 1147    Visit Number 26   Number of Visits 32   Date for PT Re-Evaluation 09/22/16   PT Start Time 1115   PT Stop Time 1152   PT Time Calculation (min) 37 min      Past Medical History:  Diagnosis Date  . Adjustment disorder with mixed emotional features   . Atrial flutter (HCC)   . CAD (coronary artery disease)   . Depression   . Diabetes mellitus type II, uncontrolled (HCC)   . Diabetic peripheral neuropathy (HCC)   . Erectile dysfunction   . GERD (gastroesophageal reflux disease)   . Herpes zoster   . Hyperlipidemia   . Hypertension   . Obesity   . Osteoarthritis   . Postherpetic neuralgia   . Sleep apnea     Past Surgical History:  Procedure Laterality Date  . CARDIOVERSION N/A 11/10/2015   Procedure: CARDIOVERSION;  Surgeon: Laurey Morale, MD;  Location[facility] ENDOSCOPY;  Service: Cardiovascular;  Laterality: N/A;  . CARDIOVERSION N/A 02/21/2016   Procedure: CARDIOVERSION;  Surgeon: Lewayne Bunting, MD;  Location: [facility] ENDOSCOPY;  Service: Cardiovascular;  Laterality: N/A;  . CORONARY ARTERY BYPASS GRAFT  1996   eith a LIMA to the LAD, saphenous vein graft to the acute marginal, saphenous vein graft to the PDA and saphenous vein graft to the circumflex.  [name] KNEE ARTHROSCOPY  05/2004   right knee  . REPLACEMENT TOTAL KNEE BILATERAL    . SHOULDER SURGERY      Vitals:   09/14/16 1151  Pulse: 67  SpO2: 97%        Subjective Assessment - 09/14/16 1145    Subjective Patient reported doing good after last treatment and ok today. A little tired though   Pertinent History Recent falls.  Peripheral neuropathy. Chronic  atrial fib.; bil TKA's.   How long can you walk comfortably? Short distances with a FWW.   Diagnostic tests MRI; CT   Currently in Pain? No/denies                         [facility] Adult PT Treatment/Exercise - 09/14/16 0001      Knee/Hip Exercises: Aerobic   Nustep L6 x15 min, monitored for progression     Knee/Hip Exercises: Machines for Strengthening   Cybex Knee Extension 20# x 5 min    Cybex Knee Flexion 40# x 5 min     Knee/Hip Exercises: Seated   Sit to Sand 2 sets;10 reps             Balance Exercises - 09/13/16 1414      Balance Exercises: Standing   Standing Eyes Opened Wide (BOA);Solid surface;4 reps   Marching Limitations x20 reps   Heel Raises Limitations x10   Toe Raise Limitations x10   Sit to Stand Time x10             PT Short Term Goals - 09/10/16 1517      PT SHORT TERM GOAL #1   Title Independent with an initial HEP.   Time 4   Period Weeks   Status Achieved  PT SHORT TERM GOAL #2   Title Increase Berg score to 32/56.   Time 4   Period Weeks   Status Achieved           PT Long Term Goals - 07/16/16 0930      PT LONG TERM GOAL #1   Title Independent with an advanced HEP.   Time 8   Period Weeks   Status On-going     PT LONG TERM GOAL #2   Title Increase bilateral LE strength to a solid 4+/5 to increase stability for functional activites.   Time 8   Period Weeks   Status On-going     PT LONG TERM GOAL #3   Title Walk in clinic with FWW 500 feet with supervision only without resting and 02 not going below 93%.   Time 8   Period Weeks   Status On-going     PT LONG TERM GOAL #4   Title Perform a reciprocating stair gait with one railing.   Time 8   Period Weeks   Status On-going     PT LONG TERM GOAL #5   Title Increase Berg score to 44-45/56.   Time 8   Period Weeks   Status On-going               Plan - 09/14/16 1215    Clinical Impression Statement Pt arrived today not feeling very  well and His wife stating that He is still " off" and is having a nurse come out to check him. Decreased Rx time today due to Pt not wanting to perform  any balance act.'s. LTGs are ongoing due to deficits.   Clinical Impairments Affecting Rehab Potential Poor balance.  H/o CVA.   PT Treatment/Interventions ADLs/Self Care Home Management;Functional mobility training;Stair training;Gait training;Therapeutic activities;Therapeutic exercise;Balance training;Neuromuscular re-education;Patient/family education   PT Next Visit Plan Please monitor BP; HR and 02 sat.  Bilateral LE strengthening to include core exercises.  Gait and balance activites.      Consulted and Agree with Plan of Care Patient      Patient will benefit from skilled therapeutic intervention in order to improve the following deficits and impairments:  Abnormal gait, Decreased activity tolerance, Decreased balance, Decreased mobility, Decreased coordination, Decreased strength, Difficulty walking  Visit Diagnosis: Muscle weakness (generalized)  Unsteadiness on feet  Frontal lobe and executive function deficit following cerebral infarction     Problem List Patient Active Problem List   Diagnosis Date Noted  . Frontal lobe and executive function deficit following cerebral infarction 07/02/2016  . Neurologic gait disorder   . Coronary artery disease involving coronary bypass graft of native heart without angina pectoris   . Benign essential HTN   . Acute kidney injury (HCC)   . Nausea & vomiting 06/28/2016  . Ischemic stroke of frontal lobe (HCC) 06/28/2016  . Thrombocytopenia (HCC) 06/28/2016  . Renal failure (ARF), acute on chronic (HCC) 06/28/2016  . Stroke (cerebrum) (HCC) 06/28/2016  . Acute CVA (cerebrovascular accident) (HCC) 06/28/2016  . Insomnia 02/02/2016  . Atrial fibrillation (HCC) 10/27/2015  . Uncoordinated movements 02/24/2014  . Unspecified hereditary and idiopathic peripheral neuropathy 02/24/2013  .  Low back pain 02/24/2013  . Abnormality of gait 02/09/2013  . Memory loss, short term 12/05/2011  . LUMBAR STRAIN, ACUTE 05/05/2010  . OSA (obstructive sleep apnea) 09/14/2009  . BPH (benign prostatic hyperplasia) 07/27/2009  . CARDIOVASCULAR STUDIES, ABNORMAL 07/27/2009  . CHEST PAIN 06/08/2009  . OSTEOARTHRITIS 05/30/2009  . HEEL PAIN,  LEFT 05/24/2009  . Dyspnea on exertion 05/24/2009  . ADJUSTMENT DISORDER WITH MIXED FEATURES 08/25/2008  . Diabetes type 2, uncontrolled (HCC) 02/24/2008  . ERECTILE DYSFUNCTION 02/10/2007  . Hyperlipidemia 10/09/2006  . OBESITY 10/09/2006  . Essential hypertension 10/09/2006  . Coronary atherosclerosis 10/09/2006  . GERD 10/09/2006  . DM (diabetes mellitus) type II uncontrolled with retinopathy 06/10/2006    RAMSEUR,CHRIS, PTA 09/14/2016, 12:21 PM  [facility] [name] [place], [place], [address] Phone: [phone]   Fax:  [phone]  Name: ROMIE TAY MRN: [id number] Date of Birth: 01-16-1946

## 2016-09-17 ENCOUNTER — Encounter: Payer: Medicare Other | Admitting: Physical Therapy

## 2016-09-17 ENCOUNTER — Other Ambulatory Visit: Payer: Self-pay | Admitting: *Deleted

## 2016-09-17 NOTE — Patient Outreach (Signed)
Telephone call to patient to schedule home visit, no answer to home phone or cell phone, left voicemail on [phone] number requesting return phone call.  Letter previously mailed to pt requesting return phone call, also previous voice mail left for pt.  PLAN Case being kept open 10 days from date of letter mailed  Irving ShowsJulie Farmer [facility], BSN [facility] Care Coordinator [phone]

## 2016-09-18 ENCOUNTER — Ambulatory Visit: Payer: Medicare Other | Attending: Physical Medicine & Rehabilitation | Admitting: *Deleted

## 2016-09-18 DIAGNOSIS — M6281 Muscle weakness (generalized): Secondary | ICD-10-CM | POA: Diagnosis not present

## 2016-09-18 DIAGNOSIS — I69314 Frontal lobe and executive function deficit following cerebral infarction: Secondary | ICD-10-CM | POA: Insufficient documentation

## 2016-09-18 DIAGNOSIS — F339 Major depressive disorder, recurrent, unspecified: Secondary | ICD-10-CM | POA: Diagnosis not present

## 2016-09-18 DIAGNOSIS — Z760 Encounter for issue of repeat prescription: Secondary | ICD-10-CM | POA: Insufficient documentation

## 2016-09-18 DIAGNOSIS — R260 Ataxic gait: Secondary | ICD-10-CM | POA: Diagnosis not present

## 2016-09-18 DIAGNOSIS — R2681 Unsteadiness on feet: Secondary | ICD-10-CM | POA: Insufficient documentation

## 2016-09-18 DIAGNOSIS — R2689 Other abnormalities of gait and mobility: Secondary | ICD-10-CM | POA: Diagnosis not present

## 2016-09-18 NOTE — Therapy (Signed)
[name] [address] [place], [place], [address] Phone: [phone]   Fax:  [phone]  Physical Therapy Treatment  Patient Details  Name: NORMAL RECINOS MRN: [id number] Date of Birth: Apr 05, 1945 Referring Provider: Claudette Laws, MD  Encounter Date: 09/18/2016      PT End of Session - 09/18/16 1353    Visit Number 27   Number of Visits 32   Date for PT Re-Evaluation 09/22/16   PT Start Time 1349   PT Stop Time 1439   PT Time Calculation (min) 50 min      Past Medical History:  Diagnosis Date  . Adjustment disorder with mixed emotional features   . Atrial flutter (HCC)   . CAD (coronary artery disease)   . Depression   . Diabetes mellitus type II, uncontrolled (HCC)   . Diabetic peripheral neuropathy (HCC)   . Erectile dysfunction   . GERD (gastroesophageal reflux disease)   . Herpes zoster   . Hyperlipidemia   . Hypertension   . Obesity   . Osteoarthritis   . Postherpetic neuralgia   . Sleep apnea     Past Surgical History:  Procedure Laterality Date  . CARDIOVERSION N/A 11/10/2015   Procedure: CARDIOVERSION;  Surgeon: Laurey Morale, MD;  Location: [facility] ENDOSCOPY;  Service: Cardiovascular;  Laterality: N/A;  . CARDIOVERSION N/A 02/21/2016   Procedure: CARDIOVERSION;  Surgeon: Lewayne Bunting, MD;  Location: [facility] ENDOSCOPY;  Service: Cardiovascular;  Laterality: N/A;  . CORONARY ARTERY BYPASS GRAFT  1996   eith a LIMA to the LAD, saphenous vein graft to the acute marginal, saphenous vein graft to the PDA and saphenous vein graft to the circumflex.  [name] KNEE ARTHROSCOPY  05/2004   right knee  . REPLACEMENT TOTAL KNEE BILATERAL    . SHOULDER SURGERY      There were no vitals filed for this visit.      Subjective Assessment - 09/18/16 1351    Subjective Patient reported doing good after last treatment and ok today. A little tired though   Patient is accompained by: Family member   Pertinent History Recent falls.   Peripheral neuropathy. Chronic atrial fib.; bil TKA's.   How long can you walk comfortably? Short distances with a FWW.   Currently in Pain? No/denies                         [facility] Adult PT Treatment/Exercise - 09/18/16 0001      Knee/Hip Exercises: Aerobic   Nustep L6 x15 min, monitored for progression     Knee/Hip Exercises: Machines for Strengthening   Cybex Knee Extension 20# x 5 min    Cybex Knee Flexion 40# x 5 min     Knee/Hip Exercises: Standing   Rocker Board 5 minutes  balance with CGA             Balance Exercises - 09/18/16 1505      Balance Exercises: Standing   Marching Limitations 2x20 reps  focus on RT LE control      Lateral Stepping strategies used see flowsheet        PT Short Term Goals - 09/10/16 1517      PT SHORT TERM GOAL #1   Title Independent with an initial HEP.   Time 4   Period Weeks   Status Achieved     PT SHORT TERM GOAL #2   Title Increase Berg score to 32/56.   Time 4  Period Weeks   Status Achieved           PT Long Term Goals - 07/16/16 0930      PT LONG TERM GOAL #1   Title Independent with an advanced HEP.   Time 8   Period Weeks   Status On-going     PT LONG TERM GOAL #2   Title Increase bilateral LE strength to a solid 4+/5 to increase stability for functional activites.   Time 8   Period Weeks   Status On-going     PT LONG TERM GOAL #3   Title Walk in clinic with FWW 500 feet with supervision only without resting and 02 not going below 93%.   Time 8   Period Weeks   Status On-going     PT LONG TERM GOAL #4   Title Perform a reciprocating stair gait with one railing.   Time 8   Period Weeks   Status On-going     PT LONG TERM GOAL #5   Title Increase Berg score to 44-45/56.   Time 8   Period Weeks   Status On-going               Plan - 09/18/16 1512    Clinical Impression Statement Pt arrived to clinic feeling better than last time with more energy and more "with  it" today. He was able to complete strength and balance exs easier today with O2 staying in the 90's and HR 74-78 BPM.. We worked on stepping strategies for RT LE and Pt improved with RT LE control  during the Rx. Great job today.    Clinical Impairments Affecting Rehab Potential Poor balance.  H/o CVA.   PT Frequency 2x / week   PT Duration 8 weeks   PT Treatment/Interventions ADLs/Self Care Home Management;Functional mobility training;Stair training;Gait training;Therapeutic activities;Therapeutic exercise;Balance training;Neuromuscular re-education;Patient/family education   PT Next Visit Plan Please monitor BP; HR and 02 sat.  Bilateral LE strengthening to include core exercises.  Gait and balance activites.      Consulted and Agree with Plan of Care Patient      Patient will benefit from skilled therapeutic intervention in order to improve the following deficits and impairments:  Abnormal gait, Decreased activity tolerance, Decreased balance, Decreased mobility, Decreased coordination, Decreased strength, Difficulty walking  Visit Diagnosis: Muscle weakness (generalized)  Unsteadiness on feet  Frontal lobe and executive function deficit following cerebral infarction     Problem List Patient Active Problem List   Diagnosis Date Noted  . Frontal lobe and executive function deficit following cerebral infarction 07/02/2016  . Neurologic gait disorder   . Coronary artery disease involving coronary bypass graft of native heart without angina pectoris   . Benign essential HTN   . Acute kidney injury (HCC)   . Nausea & vomiting 06/28/2016  . Ischemic stroke of frontal lobe (HCC) 06/28/2016  . Thrombocytopenia (HCC) 06/28/2016  . Renal failure (ARF), acute on chronic (HCC) 06/28/2016  . Stroke (cerebrum) (HCC) 06/28/2016  . Acute CVA (cerebrovascular accident) (HCC) 06/28/2016  . Insomnia 02/02/2016  . Atrial fibrillation (HCC) 10/27/2015  . Uncoordinated movements 02/24/2014  .  Unspecified hereditary and idiopathic peripheral neuropathy 02/24/2013  . Low back pain 02/24/2013  . Abnormality of gait 02/09/2013  . Memory loss, short term 12/05/2011  . LUMBAR STRAIN, ACUTE 05/05/2010  . OSA (obstructive sleep apnea) 09/14/2009  . BPH (benign prostatic hyperplasia) 07/27/2009  . CARDIOVASCULAR STUDIES, ABNORMAL 07/27/2009  . CHEST PAIN 06/08/2009  .  OSTEOARTHRITIS 05/30/2009  . HEEL PAIN, LEFT 05/24/2009  . Dyspnea on exertion 05/24/2009  . ADJUSTMENT DISORDER WITH MIXED FEATURES 08/25/2008  . Diabetes type 2, uncontrolled (HCC) 02/24/2008  . ERECTILE DYSFUNCTION 02/10/2007  . Hyperlipidemia 10/09/2006  . OBESITY 10/09/2006  . Essential hypertension 10/09/2006  . Coronary atherosclerosis 10/09/2006  . GERD 10/09/2006  . DM (diabetes mellitus) type II uncontrolled with retinopathy 06/10/2006    Ruthel Martine,CHRIS , PTA 09/18/2016, 3:48 PM  [facility] [address] [place], [place], [address] Phone: [phone]   Fax:  [phone]  Name: LENORD FRALIX MRN: [id number] Date of Birth: December 29, 1945

## 2016-09-21 ENCOUNTER — Ambulatory Visit: Payer: Medicare Other | Admitting: *Deleted

## 2016-09-21 ENCOUNTER — Other Ambulatory Visit: Payer: Self-pay | Admitting: *Deleted

## 2016-09-21 VITALS — HR 82

## 2016-09-21 DIAGNOSIS — R2681 Unsteadiness on feet: Secondary | ICD-10-CM | POA: Diagnosis not present

## 2016-09-21 DIAGNOSIS — I69314 Frontal lobe and executive function deficit following cerebral infarction: Secondary | ICD-10-CM

## 2016-09-21 DIAGNOSIS — F339 Major depressive disorder, recurrent, unspecified: Secondary | ICD-10-CM | POA: Diagnosis not present

## 2016-09-21 DIAGNOSIS — Z760 Encounter for issue of repeat prescription: Secondary | ICD-10-CM | POA: Diagnosis not present

## 2016-09-21 DIAGNOSIS — R260 Ataxic gait: Secondary | ICD-10-CM | POA: Diagnosis not present

## 2016-09-21 DIAGNOSIS — M6281 Muscle weakness (generalized): Secondary | ICD-10-CM | POA: Diagnosis not present

## 2016-09-21 NOTE — Therapy (Signed)
[name] [address] [place], [place], [address] Phone: [phone]   Fax:  [phone]  Physical Therapy Treatment  Patient Details  Name: Luis Yu MRN: [id number] Date of Birth: 04-09-1945 Referring Provider: Claudette Laws, MD  Encounter Date: 09/21/2016      PT End of Session - 09/21/16 1133    Visit Number 28   Number of Visits 32   Date for PT Re-Evaluation 09/22/16   PT Start Time 1115   PT Stop Time 1205   PT Time Calculation (min) 50 min      Past Medical History:  Diagnosis Date  . Adjustment disorder with mixed emotional features   . Atrial flutter (HCC)   . CAD (coronary artery disease)   . Depression   . Diabetes mellitus type II, uncontrolled (HCC)   . Diabetic peripheral neuropathy (HCC)   . Erectile dysfunction   . GERD (gastroesophageal reflux disease)   . Herpes zoster   . Hyperlipidemia   . Hypertension   . Obesity   . Osteoarthritis   . Postherpetic neuralgia   . Sleep apnea     Past Surgical History:  Procedure Laterality Date  . CARDIOVERSION N/A 11/10/2015   Procedure: CARDIOVERSION;  Surgeon: Laurey Morale, MD;  Location: [facility] ENDOSCOPY;  Service: Cardiovascular;  Laterality: N/A;  . CARDIOVERSION N/A 02/21/2016   Procedure: CARDIOVERSION;  Surgeon: Lewayne Bunting, MD;  Location: [facility] ENDOSCOPY;  Service: Cardiovascular;  Laterality: N/A;  . CORONARY ARTERY BYPASS GRAFT  1996   eith a LIMA to the LAD, saphenous vein graft to the acute marginal, saphenous vein graft to the PDA and saphenous vein graft to the circumflex.  [name] KNEE ARTHROSCOPY  05/2004   right knee  . REPLACEMENT TOTAL KNEE BILATERAL    . SHOULDER SURGERY      Vitals:   09/21/16 1134  Pulse: 82  SpO2: 98%        Subjective Assessment - 09/21/16 1132    Subjective Patient reported doing good after last treatment and ok today. A little tired though   Patient is accompained by: Family member   Pertinent History  Recent falls.  Peripheral neuropathy. Chronic atrial fib.; bil TKA's.   How long can you walk comfortably? Short distances with a FWW.   Diagnostic tests MRI; CT   Currently in Pain? No/denies                         [facility] Adult PT Treatment/Exercise - 09/21/16 0001      Knee/Hip Exercises: Aerobic   Nustep L6 x15 min, monitored for progression     Knee/Hip Exercises: Machines for Strengthening   Cybex Knee Extension 20# x 5 min    Cybex Knee Flexion 40# x 5 min     Knee/Hip Exercises: Standing   Rocker Board --  balance with CGA     Knee/Hip Exercises: Seated   Sit to Sand --     Gait with walker in hallway x 200 ft. With focus on RT LE control        Balance Exercises - 09/21/16 1208      Balance Exercises: Standing   Stepping Strategy Lateral;Anterior  6x10 using colored balance pods  for RT LE             PT Short Term Goals - 09/10/16 1517      PT SHORT TERM GOAL #1   Title Independent with an initial HEP.  Time 4   Period Weeks   Status Achieved     PT SHORT TERM GOAL #2   Title Increase Berg score to 32/56.   Time 4   Period Weeks   Status Achieved           PT Long Term Goals - 07/16/16 0930      PT LONG TERM GOAL #1   Title Independent with an advanced HEP.   Time 8   Period Weeks   Status On-going     PT LONG TERM GOAL #2   Title Increase bilateral LE strength to a solid 4+/5 to increase stability for functional activites.   Time 8   Period Weeks   Status On-going     PT LONG TERM GOAL #3   Title Walk in clinic with FWW 500 feet with supervision only without resting and 02 not going below 93%.   Time 8   Period Weeks   Status On-going     PT LONG TERM GOAL #4   Title Perform a reciprocating stair gait with one railing.   Time 8   Period Weeks   Status On-going     PT LONG TERM GOAL #5   Title Increase Berg score to 44-45/56.   Time 8   Period Weeks   Status On-going               Plan -  09/21/16 1134    Clinical Impression Statement Pt arrived today doing ok , but his wife feels that he is still off because he won't wear his sleeping mask. He did better with stepping strategies today after practicing. Gait was practiced  in clinic  with focus on RT LE control to decrease toe drag. Pt's O2 between 92-98 % today an  HR 74-84 BPM   Clinical Impairments Affecting Rehab Potential Poor balance.  H/o CVA.   PT Frequency 2x / week   PT Duration 8 weeks   PT Treatment/Interventions ADLs/Self Care Home Management;Functional mobility training;Stair training;Gait training;Therapeutic activities;Therapeutic exercise;Balance training;Neuromuscular re-education;Patient/family education   PT Next Visit Plan Please monitor BP; HR and 02 sat.  Bilateral LE strengthening to include core exercises.  Gait and balance activites.      Consulted and Agree with Plan of Care Patient      Patient will benefit from skilled therapeutic intervention in order to improve the following deficits and impairments:  Abnormal gait, Decreased activity tolerance, Decreased balance, Decreased mobility, Decreased coordination, Decreased strength, Difficulty walking  Visit Diagnosis: Muscle weakness (generalized)  Unsteadiness on feet  Frontal lobe and executive function deficit following cerebral infarction     Problem List Patient Active Problem List   Diagnosis Date Noted  . Frontal lobe and executive function deficit following cerebral infarction 07/02/2016  . Neurologic gait disorder   . Coronary artery disease involving coronary bypass graft of native heart without angina pectoris   . Benign essential HTN   . Acute kidney injury (HCC)   . Nausea & vomiting 06/28/2016  . Ischemic stroke of frontal lobe (HCC) 06/28/2016  . Thrombocytopenia (HCC) 06/28/2016  . Renal failure (ARF), acute on chronic (HCC) 06/28/2016  . Stroke (cerebrum) (HCC) 06/28/2016  . Acute CVA (cerebrovascular accident) (HCC)  06/28/2016  . Insomnia 02/02/2016  . Atrial fibrillation (HCC) 10/27/2015  . Uncoordinated movements 02/24/2014  . Unspecified hereditary and idiopathic peripheral neuropathy 02/24/2013  . Low back pain 02/24/2013  . Abnormality of gait 02/09/2013  . Memory loss, short term 12/05/2011  .  LUMBAR STRAIN, ACUTE 05/05/2010  . OSA (obstructive sleep apnea) 09/14/2009  . BPH (benign prostatic hyperplasia) 07/27/2009  . CARDIOVASCULAR STUDIES, ABNORMAL 07/27/2009  . CHEST PAIN 06/08/2009  . OSTEOARTHRITIS 05/30/2009  . HEEL PAIN, LEFT 05/24/2009  . Dyspnea on exertion 05/24/2009  . ADJUSTMENT DISORDER WITH MIXED FEATURES 08/25/2008  . Diabetes type 2, uncontrolled (HCC) 02/24/2008  . ERECTILE DYSFUNCTION 02/10/2007  . Hyperlipidemia 10/09/2006  . OBESITY 10/09/2006  . Essential hypertension 10/09/2006  . Coronary atherosclerosis 10/09/2006  . GERD 10/09/2006  . DM (diabetes mellitus) type II uncontrolled with retinopathy 06/10/2006    Ardath Lepak,CHRIS 09/21/2016, 12:33 PM  [name] [address] [place], [place], [address] Phone: [phone]   Fax:  [phone]  Name: Luis Yu MRN: [id number] Date of Birth: 1945-10-30

## 2016-09-21 NOTE — Patient Outreach (Signed)
Case closed, RN CM was unable to reach pt, letter mailed to pt home and case kept open for 10 days.  RN CM sent in basket to [employer] Harrison CSW informing of case closure.  Irving ShowsJulie Michaeline Eckers[facility], BSN [facility] Community Care Coordinator [phone]

## 2016-09-24 ENCOUNTER — Encounter: Payer: Medicare Other | Admitting: *Deleted

## 2016-09-26 ENCOUNTER — Other Ambulatory Visit: Payer: Self-pay | Admitting: *Deleted

## 2016-09-26 ENCOUNTER — Encounter: Payer: Self-pay | Admitting: Physical Therapy

## 2016-09-26 ENCOUNTER — Ambulatory Visit: Payer: Medicare Other | Admitting: Physical Therapy

## 2016-09-26 DIAGNOSIS — F339 Major depressive disorder, recurrent, unspecified: Secondary | ICD-10-CM | POA: Diagnosis not present

## 2016-09-26 DIAGNOSIS — I69314 Frontal lobe and executive function deficit following cerebral infarction: Secondary | ICD-10-CM | POA: Diagnosis not present

## 2016-09-26 DIAGNOSIS — M6281 Muscle weakness (generalized): Secondary | ICD-10-CM

## 2016-09-26 DIAGNOSIS — R2681 Unsteadiness on feet: Secondary | ICD-10-CM

## 2016-09-26 DIAGNOSIS — Z760 Encounter for issue of repeat prescription: Secondary | ICD-10-CM | POA: Diagnosis not present

## 2016-09-26 DIAGNOSIS — R260 Ataxic gait: Secondary | ICD-10-CM | POA: Diagnosis not present

## 2016-09-26 NOTE — Patient Outreach (Signed)
[facility] Care Management  09/26/2016  Doretha ImusDonald W Dejarnette Aug 11, 1945 [id number]   CSW attempted for a second time to reach patient/wife, [name] to follow-up on companion care services patient was agreeable to, wife just had to touch base with Olegario MessierKathy at [employer]. No answer at home phone number. CSW left voicemail for patient's wife, [name] on her cellphone (phone #: [phone]). CSW will await call back from patient's wife or will follow-up next week. CSW mailed letter to patient's home requesting return phone call.    Lincoln MaxinKelly Stepen Prins, LCSW [facility]  Clinical Social Worker cell #: [phone]

## 2016-09-26 NOTE — Therapy (Signed)
[name] [address] [place], [place], [address] Phone: [phone]   Fax:  [phone]  Physical Therapy Treatment  Patient Details  Name: Luis Yu MRN: [id number] Date of Birth: 05/02/1945 Referring Provider: Claudette Laws, MD  Encounter Date: 09/26/2016      PT End of Session - 09/26/16 1440    Visit Number 29   Number of Visits 32   Date for PT Re-Evaluation 09/22/16   PT Start Time 1400   PT Stop Time 1444   PT Time Calculation (min) 44 min   Equipment Utilized During Treatment Gait belt   Activity Tolerance Patient tolerated treatment well   Behavior During Therapy [facility] for tasks assessed/performed      Past Medical History:  Diagnosis Date  . Adjustment disorder with mixed emotional features   . Atrial flutter (HCC)   . CAD (coronary artery disease)   . Depression   . Diabetes mellitus type II, uncontrolled (HCC)   . Diabetic peripheral neuropathy (HCC)   . Erectile dysfunction   . GERD (gastroesophageal reflux disease)   . Herpes zoster   . Hyperlipidemia   . Hypertension   . Obesity   . Osteoarthritis   . Postherpetic neuralgia   . Sleep apnea     Past Surgical History:  Procedure Laterality Date  . CARDIOVERSION N/A 11/10/2015   Procedure: CARDIOVERSION;  Surgeon: Laurey Morale, MD;  Location: [facility] ENDOSCOPY;  Service: Cardiovascular;  Laterality: N/A;  . CARDIOVERSION N/A 02/21/2016   Procedure: CARDIOVERSION;  Surgeon: Lewayne Bunting, MD;  Location: [facility] ENDOSCOPY;  Service: Cardiovascular;  Laterality: N/A;  . CORONARY ARTERY BYPASS GRAFT  1996   eith a LIMA to the LAD, saphenous vein graft to the acute marginal, saphenous vein graft to the PDA and saphenous vein graft to the circumflex.  [name] KNEE ARTHROSCOPY  05/2004   right knee  . REPLACEMENT TOTAL KNEE BILATERAL    . SHOULDER SURGERY      There were no vitals filed for this visit.      Subjective Assessment - 09/26/16 1420    Subjective  Patient reported doing well after last treatment and good today   Patient is accompained by: Family member   Pertinent History Recent falls.  Peripheral neuropathy. Chronic atrial fib.; bil TKA's.   How long can you walk comfortably? Short distances with a FWW.   Diagnostic tests MRI; CT   Currently in Pain? No/denies                         [facility] Adult PT Treatment/Exercise - 09/26/16 0001      Knee/Hip Exercises: Aerobic   Nustep L6 x15 min, monitored for progression     Knee/Hip Exercises: Machines for Strengthening   Cybex Knee Extension 20# x 5 min    Cybex Knee Flexion 40# x 5 min     Knee/Hip Exercises: Seated   Clamshell with TheraBand Green  x30             Balance Exercises - 09/26/16 1430      Balance Exercises: Standing   Standing, One Foot on a Step 6 inch;Eyes open   Marching Limitations 2x10   Heel Raises Limitations x10   Toe Raise Limitations x10   Sit to Stand Time x10             PT Short Term Goals - 09/10/16 1517      PT SHORT TERM GOAL #  1   Title Independent with an initial HEP.   Time 4   Period Weeks   Status Achieved     PT SHORT TERM GOAL #2   Title Increase Berg score to 32/56.   Time 4   Period Weeks   Status Achieved           PT Long Term Goals - 07/16/16 0930      PT LONG TERM GOAL #1   Title Independent with an advanced HEP.   Time 8   Period Weeks   Status On-going     PT LONG TERM GOAL #2   Title Increase bilateral LE strength to a solid 4+/5 to increase stability for functional activites.   Time 8   Period Weeks   Status On-going     PT LONG TERM GOAL #3   Title Walk in clinic with FWW 500 feet with supervision only without resting and 02 not going below 93%.   Time 8   Period Weeks   Status On-going     PT LONG TERM GOAL #4   Title Perform a reciprocating stair gait with one railing.   Time 8   Period Weeks   Status On-going     PT LONG TERM GOAL #5   Title Increase Berg  score to 44-45/56.   Time 8   Period Weeks   Status On-going               Plan - 09/26/16 1441    Clinical Impression Statement Patient tolerated treatment well today. Patient progressing with all activities slowly. Patient has LOB with balance activites with CGA and able to correct with assist. Patient's wife reported no carry over at home with activity and no exercises. Patient unable to meet any further goals due to strength and balance deficts.    Clinical Impairments Affecting Rehab Potential Poor balance.  H/o CVA.   PT Frequency 2x / week   PT Duration 8 weeks   PT Treatment/Interventions ADLs/Self Care Home Management;Functional mobility training;Stair training;Gait training;Therapeutic activities;Therapeutic exercise;Balance training;Neuromuscular re-education;Patient/family education   PT Next Visit Plan Please monitor BP; HR and 02 sat.  Bilateral LE strengthening to include core exercises.  Gait and balance activites.      Consulted and Agree with Plan of Care Patient      Patient will benefit from skilled therapeutic intervention in order to improve the following deficits and impairments:  Abnormal gait, Decreased activity tolerance, Decreased balance, Decreased mobility, Decreased coordination, Decreased strength, Difficulty walking  Visit Diagnosis: Muscle weakness (generalized)  Unsteadiness on feet     Problem List Patient Active Problem List   Diagnosis Date Noted  . Frontal lobe and executive function deficit following cerebral infarction 07/02/2016  . Neurologic gait disorder   . Coronary artery disease involving coronary bypass graft of native heart without angina pectoris   . Benign essential HTN   . Acute kidney injury (HCC)   . Nausea & vomiting 06/28/2016  . Ischemic stroke of frontal lobe (HCC) 06/28/2016  . Thrombocytopenia (HCC) 06/28/2016  . Renal failure (ARF), acute on chronic (HCC) 06/28/2016  . Stroke (cerebrum) (HCC) 06/28/2016  .  Acute CVA (cerebrovascular accident) (HCC) 06/28/2016  . Insomnia 02/02/2016  . Atrial fibrillation (HCC) 10/27/2015  . Uncoordinated movements 02/24/2014  . Unspecified hereditary and idiopathic peripheral neuropathy 02/24/2013  . Low back pain 02/24/2013  . Abnormality of gait 02/09/2013  . Memory loss, short term 12/05/2011  . LUMBAR STRAIN, ACUTE 05/05/2010  .  OSA (obstructive sleep apnea) 09/14/2009  . BPH (benign prostatic hyperplasia) 07/27/2009  . CARDIOVASCULAR STUDIES, ABNORMAL 07/27/2009  . CHEST PAIN 06/08/2009  . OSTEOARTHRITIS 05/30/2009  . HEEL PAIN, LEFT 05/24/2009  . Dyspnea on exertion 05/24/2009  . ADJUSTMENT DISORDER WITH MIXED FEATURES 08/25/2008  . Diabetes type 2, uncontrolled (HCC) 02/24/2008  . ERECTILE DYSFUNCTION 02/10/2007  . Hyperlipidemia 10/09/2006  . OBESITY 10/09/2006  . Essential hypertension 10/09/2006  . Coronary atherosclerosis 10/09/2006  . GERD 10/09/2006  . DM (diabetes mellitus) type II uncontrolled with retinopathy 06/10/2006    Kalecia Hartney P, PTA 09/26/2016, 2:47 PM  [name] [address] [place], [place], [address] Phone: [phone]   Fax:  [phone]  Name: Luis Yu MRN: [id number] Date of Birth: 30-Apr-1945

## 2016-09-27 ENCOUNTER — Ambulatory Visit: Payer: Medicare Other | Admitting: *Deleted

## 2016-09-27 DIAGNOSIS — M6281 Muscle weakness (generalized): Secondary | ICD-10-CM

## 2016-09-27 DIAGNOSIS — R2681 Unsteadiness on feet: Secondary | ICD-10-CM

## 2016-09-27 DIAGNOSIS — I69314 Frontal lobe and executive function deficit following cerebral infarction: Secondary | ICD-10-CM

## 2016-09-27 DIAGNOSIS — R260 Ataxic gait: Secondary | ICD-10-CM | POA: Diagnosis not present

## 2016-09-27 DIAGNOSIS — Z760 Encounter for issue of repeat prescription: Secondary | ICD-10-CM | POA: Diagnosis not present

## 2016-09-27 DIAGNOSIS — F339 Major depressive disorder, recurrent, unspecified: Secondary | ICD-10-CM | POA: Diagnosis not present

## 2016-09-27 NOTE — Therapy (Signed)
[name] [address] [place], [place], [address] Phone: [phone]   Fax:  [phone]  Physical Therapy Treatment  Patient Details  Name: Luis Yu MRN: [id number] Date of Birth: 24-May-1945 Referring Provider: Claudette LawsAndrew Kirsteins, MD  Encounter Date: 09/27/2016      PT End of Session - 09/27/16 1448    Visit Number 30   Number of Visits 32   Date for PT Re-Evaluation 09/22/16   PT Start Time 1345   PT Stop Time 1435   PT Time Calculation (min) 50 min      Past Medical History:  Diagnosis Date  . Adjustment disorder with mixed emotional features   . Atrial flutter (HCC)   . CAD (coronary artery disease)   . Depression   . Diabetes mellitus type II, uncontrolled (HCC)   . Diabetic peripheral neuropathy (HCC)   . Erectile dysfunction   . GERD (gastroesophageal reflux disease)   . Herpes zoster   . Hyperlipidemia   . Hypertension   . Obesity   . Osteoarthritis   . Postherpetic neuralgia   . Sleep apnea     Past Surgical History:  Procedure Laterality Date  . CARDIOVERSION N/A 11/10/2015   Procedure: CARDIOVERSION;  Surgeon: Laurey Moralealton S McLean, MD;  [facility];  Service: Cardiovascular;  Laterality: N/A;  . CARDIOVERSION N/A 02/21/2016   Procedure: CARDIOVERSION;  Surgeon: Lewayne BuntingBrian S Crenshaw, MD;  [facility];  Service: Cardiovascular;  Laterality: N/A;  . CORONARY ARTERY BYPASS GRAFT  1996   eith a LIMA to the LAD, saphenous vein graft to the acute marginal, saphenous vein graft to the PDA and saphenous vein graft to the circumflex.  [name]. KNEE ARTHROSCOPY  05/2004   right knee  . REPLACEMENT TOTAL KNEE BILATERAL    . SHOULDER SURGERY      There were no vitals filed for this visit.      Subjective Assessment - 09/27/16 1408    Subjective Patient reported doing well after last treatment and good today                         [facility] Adult PT Treatment/Exercise - 09/27/16 0001       Knee/Hip Exercises: Aerobic   Nustep L6 x15 min, monitored for progression     Knee/Hip Exercises: Machines for Strengthening   Cybex Knee Extension 20# x 5 min    Cybex Knee Flexion 40# x 5 min             Balance Exercises - 09/27/16 1412      Balance Exercises: Standing   Standing, One Foot on a Step 6 inch;Eyes open   Stepping Strategy Lateral;Anterior  6x10 using colored balance pods  for RT LE   Rockerboard Anterior/posterior;EO  5 mins    Gait in hallway with Rollator with focus on RT LE control and foot/toe clearance.       PT Short Term Goals - 09/10/16 1517      PT SHORT TERM GOAL #1   Title Independent with an initial HEP.   Time 4   Period Weeks   Status Achieved     PT SHORT TERM GOAL #2   Title Increase Berg score to 32/56.   Time 4   Period Weeks   Status Achieved           PT Long Term Goals - 07/16/16 0930      PT LONG TERM GOAL #1  Title Independent with an advanced HEP.   Time 8   Period Weeks   Status On-going     PT LONG TERM GOAL #2   Title Increase bilateral LE strength to a solid 4+/5 to increase stability for functional activites.   Time 8   Period Weeks   Status On-going     PT LONG TERM GOAL #3   Title Walk in clinic with FWW 500 feet with supervision only without resting and 02 not going below 93%.   Time 8   Period Weeks   Status On-going     PT LONG TERM GOAL #4   Title Perform a reciprocating stair gait with one railing.   Time 8   Period Weeks   Status On-going     PT LONG TERM GOAL #5   Title Increase Berg score to 44-45/56.   Time 8   Period Weeks   Status On-going               Plan - 09/27/16 1749    Clinical Impression Statement Pt arrived to clinic today doing better and was with-it more as per his wife. He was able to perform better with balance and RT LE control today. He still needs V/Cs and tactile for correct technique and placement with Ex's. There was more carry-over today when  practiicing gait  with less toe drag      Patient will benefit from skilled therapeutic intervention in order to improve the following deficits and impairments:     Visit Diagnosis: Muscle weakness (generalized)  Unsteadiness on feet  Frontal lobe and executive function deficit following cerebral infarction     Problem List Patient Active Problem List   Diagnosis Date Noted  . Frontal lobe and executive function deficit following cerebral infarction 07/02/2016  . Neurologic gait disorder   . Coronary artery disease involving coronary bypass graft of native heart without angina pectoris   . Benign essential HTN   . Acute kidney injury (HCC)   . Nausea & vomiting 06/28/2016  . Ischemic stroke of frontal lobe (HCC) 06/28/2016  . Thrombocytopenia (HCC) 06/28/2016  . Renal failure (ARF), acute on chronic (HCC) 06/28/2016  . Stroke (cerebrum) (HCC) 06/28/2016  . Acute CVA (cerebrovascular accident) (HCC) 06/28/2016  . Insomnia 02/02/2016  . Atrial fibrillation (HCC) 10/27/2015  . Uncoordinated movements 02/24/2014  . Unspecified hereditary and idiopathic peripheral neuropathy 02/24/2013  . Low back pain 02/24/2013  . Abnormality of gait 02/09/2013  . Memory loss, short term 12/05/2011  . LUMBAR STRAIN, ACUTE 05/05/2010  . OSA (obstructive sleep apnea) 09/14/2009  . BPH (benign prostatic hyperplasia) 07/27/2009  . CARDIOVASCULAR STUDIES, ABNORMAL 07/27/2009  . CHEST PAIN 06/08/2009  . OSTEOARTHRITIS 05/30/2009  . HEEL PAIN, LEFT 05/24/2009  . Dyspnea on exertion 05/24/2009  . ADJUSTMENT DISORDER WITH MIXED FEATURES 08/25/2008  . Diabetes type 2, uncontrolled (HCC) 02/24/2008  . ERECTILE DYSFUNCTION 02/10/2007  . Hyperlipidemia 10/09/2006  . OBESITY 10/09/2006  . Essential hypertension 10/09/2006  . Coronary atherosclerosis 10/09/2006  . GERD 10/09/2006  . DM (diabetes mellitus) type II uncontrolled with retinopathy 06/10/2006    Nikoletta Varma,CHRIS, PTA 09/27/2016, 5:53  PM  [facility] [address] [place], [place], [address] Phone: [phone]   Fax:  [phone]  Name: DAMYEN KNOLL MRN: [id number] Date of Birth: 08/18/1945

## 2016-10-02 ENCOUNTER — Other Ambulatory Visit: Payer: Self-pay | Admitting: *Deleted

## 2016-10-02 ENCOUNTER — Ambulatory Visit: Payer: Medicare Other | Admitting: *Deleted

## 2016-10-02 NOTE — Patient Outreach (Signed)
[facility] Care Management  10/02/2016  Doretha ImusDonald W Ricchio [birth year] [id number]   CSW made a third and final attempt to try and contact patient today to perform phone assessment as well as assess and assist with social work needs and services, without success. A HIPPA compliant message was left for patient on voicemail (ph#: [phone]). CSW continues to await return call. CSW will mail an outreach letter to patient's home, encouraging patient to contact CSW at their earliest convenience, if patient is interested in continuing to receive social work services through CSW with [facility]. If CSW does not receive a return call from patient within the next 10 business days, CSW will proceed with case closure. Required number of phone attempts will have been made and outreach letter mailed.    Lincoln MaxinKelly Demichael Traum, LCSW [facility]  Clinical Social Worker cell #: [phone]

## 2016-10-02 NOTE — Therapy (Signed)
[name] [address] [place], [place], [address] Phone: [phone]   Fax:  [phone]  Patient Details  Name: Doretha ImusDonald W Morsch MRN: [id number] Date of Birth: May 08, 1945 Referring Provider:  Shirline FreesNafziger, [name], NP  Encounter Date: 10/02/2016   RAMSEUR,CHRIS, PTA 10/02/2016, 12:30 PM  [name] [address] [place], [place], [address] Phone: [phone]   Fax:  [phone]

## 2016-10-03 ENCOUNTER — Ambulatory Visit: Payer: Medicare Other | Admitting: Physical Therapy

## 2016-10-03 ENCOUNTER — Encounter: Payer: Self-pay | Admitting: Physical Therapy

## 2016-10-03 ENCOUNTER — Telehealth: Payer: Self-pay | Admitting: Podiatry

## 2016-10-03 ENCOUNTER — Other Ambulatory Visit: Payer: Self-pay | Admitting: Adult Health

## 2016-10-03 DIAGNOSIS — Z760 Encounter for issue of repeat prescription: Secondary | ICD-10-CM | POA: Diagnosis not present

## 2016-10-03 DIAGNOSIS — R260 Ataxic gait: Secondary | ICD-10-CM | POA: Diagnosis not present

## 2016-10-03 DIAGNOSIS — M6281 Muscle weakness (generalized): Secondary | ICD-10-CM

## 2016-10-03 DIAGNOSIS — R2681 Unsteadiness on feet: Secondary | ICD-10-CM

## 2016-10-03 DIAGNOSIS — I69314 Frontal lobe and executive function deficit following cerebral infarction: Secondary | ICD-10-CM | POA: Diagnosis not present

## 2016-10-03 DIAGNOSIS — F339 Major depressive disorder, recurrent, unspecified: Secondary | ICD-10-CM | POA: Diagnosis not present

## 2016-10-03 NOTE — Telephone Encounter (Signed)
Pts wife called checking on status of pts shoes that were to be ordered. Please call and advise

## 2016-10-03 NOTE — Therapy (Signed)
[name] [place], [name], [address] Phone: [phone]   Fax:  [phone]  Physical Therapy Treatment  Patient Details  Name: Luis Yu MRN: [id number] Date of Birth: January 17, 1946 Referring Provider: Alysia Penna, MD  Encounter Date: 10/03/2016      PT End of Session - 10/03/16 1317    Visit Number 31   Number of Visits 32   Date for PT Re-Evaluation 09/22/16   PT Start Time 1941   PT Stop Time 1350   PT Time Calculation (min) 52 min   Activity Tolerance Patient tolerated treatment well   Behavior During Therapy [facility] for tasks assessed/performed      Past Medical History:  Diagnosis Date  . Adjustment disorder with mixed emotional features   . Atrial flutter ([place])   . CAD (coronary artery disease)   . Depression   . Diabetes mellitus type II, uncontrolled ([place])   . Diabetic peripheral neuropathy ([place])   . Erectile dysfunction   . GERD (gastroesophageal reflux disease)   . Herpes zoster   . Hyperlipidemia   . Hypertension   . Obesity   . Osteoarthritis   . Postherpetic neuralgia   . Sleep apnea     Past Surgical History:  Procedure Laterality Date  . CARDIOVERSION N/A 11/10/2015   Procedure: CARDIOVERSION;  Surgeon: Larey Dresser, MD;  Location: [place];  Service: Cardiovascular;  Laterality: N/A;  . CARDIOVERSION N/A 02/21/2016   Procedure: CARDIOVERSION;  Surgeon: Lelon Perla, MD;  Location: [facility] ENDOSCOPY;  Service: Cardiovascular;  Laterality: N/A;  . CORONARY ARTERY BYPASS GRAFT  1996   eith a LIMA to the LAD, saphenous vein graft to the acute marginal, saphenous vein graft to the PDA and saphenous vein graft to the circumflex.  [name] KNEE ARTHROSCOPY  05/2004   right knee  . REPLACEMENT TOTAL KNEE BILATERAL    . SHOULDER SURGERY      There were no vitals filed for this visit.      Subjective Assessment - 10/03/16 1316    Subjective Reports feeling much better than yesterday.   Patient is accompained by: Family member  Wife   Pertinent History Recent falls.  Peripheral neuropathy. Chronic atrial fib.; bil TKA's.   How long can you walk comfortably? Short distances with a FWW.   Diagnostic tests MRI; CT   Currently in Pain? Yes   Pain Score 6    Pain Location Leg   Pain Orientation Right;Left   Pain Descriptors / Indicators Discomfort            [facility] PT Assessment - 10/03/16 0001      Assessment   Medical Diagnosis Left CVA.   Onset Date/Surgical Date 07/07/16     Precautions   Precautions Fall     Restrictions   Weight Bearing Restrictions No                     [facility] Adult PT Treatment/Exercise - 10/03/16 0001      Ambulation/Gait   Ambulation/Gait Yes   Ambulation/Gait Assistance 6: Modified independent (Device/Increase time)   Ambulation Distance (Feet) 384 Feet   Assistive device Rollator   Gait Pattern Step-through pattern;Decreased step length - right;Decreased step length - left;Decreased stride length;Decreased hip/knee flexion - right;Decreased hip/knee flexion - left;Decreased dorsiflexion - right;Right foot flat;Right flexed knee in stance;Left flexed knee in stance;Trunk flexed;Decreased trunk rotation;Poor foot clearance - right;Poor foot clearance - left   Ambulation Surface Level;Indoor  Gait Comments VCs for AD positioning, erect stance, increased foot clearance     Standardized Balance Assessment   Standardized Balance Assessment Berg Balance Test     Berg Balance Test   Sit to Stand Able to stand without using hands and stabilize independently   Standing Unsupported Able to stand safely 2 minutes   Sitting with Back Unsupported but Feet Supported on Floor or Stool Able to sit safely and securely 2 minutes   Stand to Sit Controls descent by using hands   Transfers Able to transfer safely, minor use of hands   Standing Unsupported with Eyes Closed Able to stand 10 seconds safely   Standing Ubsupported with  Feet Together Able to place feet together independently and stand 1 minute safely   From Standing, Reach Forward with Outstretched Arm Can reach forward >12 cm safely (5")   From Standing Position, Pick up Object from Floor Able to pick up shoe, needs supervision   From Standing Position, Turn to Look Behind Over each Shoulder Needs supervision when turning   Turn 360 Degrees Able to turn 360 degrees safely but slowly   Standing Unsupported, Alternately Place Feet on Step/Stool Able to complete >2 steps/needs minimal assist   Standing Unsupported, One Foot in Front Needs help to step but can hold 15 seconds   Standing on One Leg Able to lift leg independently and hold equal to or more than 3 seconds   Total Score 40     Knee/Hip Exercises: Aerobic   Nustep L5 x15 min     Knee/Hip Exercises: Machines for Strengthening   Cybex Knee Extension 20# x 5 min    Cybex Knee Flexion 40# x 5 min                  PT Short Term Goals - 09/10/16 1517      PT SHORT TERM GOAL #1   Title Independent with an initial HEP.   Time 4   Period Weeks   Status Achieved     PT SHORT TERM GOAL #2   Title Increase Berg score to 32/56.   Time 4   Period Weeks   Status Achieved           PT Long Term Goals - 10/03/16 1332      PT LONG TERM GOAL #1   Title Independent with an advanced HEP.   Time 8   Period Weeks   Status On-going     PT LONG TERM GOAL #2   Title Increase bilateral LE strength to a solid 4+/5 to increase stability for functional activites.   Time 8   Period Weeks   Status On-going     PT LONG TERM GOAL #3   Title Walk in clinic with FWW 500 feet with supervision only without resting and 02 not going below 93%.   Time 8   Period Weeks   Status Partially Met  384 ft with 98% O2 and required seated rest break 10/03/2016     PT LONG TERM GOAL #4   Title Perform a reciprocating stair gait with one railing.   Time 8   Period Weeks   Status On-going     PT LONG  TERM GOAL #5   Title Increase Berg score to 44-45/56.   Time 8   Period Weeks   Status Not Met  40/56 score 10/03/2016               Plan - 10/03/16 1432  Clinical Impression Statement Patient tolerated today's treatment fairly well as he is still limited with ambulation and gait training. Patient progressing towards some goals at time time. Although limited with ambulation goal at this time as patient required rest break secondary to fatigue. During seated rest break O2 monitored to 98% which was goal status for O2 level. Patient still limited with BERG as he scored 40/56 today which did not achieve goal today.   Clinical Impairments Affecting Rehab Potential Poor balance.  H/o CVA.   PT Frequency 2x / week   PT Duration 8 weeks   PT Treatment/Interventions ADLs/Self Care Home Management;Functional mobility training;Stair training;Gait training;Therapeutic activities;Therapeutic exercise;Balance training;Neuromuscular re-education;Patient/family education   PT Next Visit Plan D/C summary with g-code required next treatment.   Consulted and Agree with Plan of Care Patient   Family Member Consulted Wife      Patient will benefit from skilled therapeutic intervention in order to improve the following deficits and impairments:  Abnormal gait, Decreased activity tolerance, Decreased balance, Decreased mobility, Decreased coordination, Decreased strength, Difficulty walking  Visit Diagnosis: Muscle weakness (generalized)  Unsteadiness on feet     Problem List Patient Active Problem List   Diagnosis Date Noted  . Frontal lobe and executive function deficit following cerebral infarction 07/02/2016  . Neurologic gait disorder   . Coronary artery disease involving coronary bypass graft of native heart without angina pectoris   . Benign essential HTN   . Acute kidney injury ([place])   . Nausea & vomiting 06/28/2016  . Ischemic stroke of frontal lobe ([place]) 06/28/2016  .  Thrombocytopenia ([place]) 06/28/2016  . Renal failure (ARF), acute on chronic (HCC) 06/28/2016  . Stroke (cerebrum) ([place]) 06/28/2016  . Acute CVA (cerebrovascular accident) ([place]) 06/28/2016  . Insomnia 02/02/2016  . Atrial fibrillation ([place]) 10/27/2015  . Uncoordinated movements 02/24/2014  . Unspecified hereditary and idiopathic peripheral neuropathy 02/24/2013  . Low back pain 02/24/2013  . Abnormality of gait 02/09/2013  . Memory loss, short term 12/05/2011  . LUMBAR STRAIN, ACUTE 05/05/2010  . OSA (obstructive sleep apnea) 09/14/2009  . BPH (benign prostatic hyperplasia) 07/27/2009  . CARDIOVASCULAR STUDIES, ABNORMAL 07/27/2009  . CHEST PAIN 06/08/2009  . OSTEOARTHRITIS 05/30/2009  . HEEL PAIN, LEFT 05/24/2009  . Dyspnea on exertion 05/24/2009  . ADJUSTMENT DISORDER WITH MIXED FEATURES 08/25/2008  . Diabetes type 2, uncontrolled ([place]) 02/24/2008  . ERECTILE DYSFUNCTION 02/10/2007  . Hyperlipidemia 10/09/2006  . OBESITY 10/09/2006  . Essential hypertension 10/09/2006  . Coronary atherosclerosis 10/09/2006  . GERD 10/09/2006  . DM (diabetes mellitus) type II uncontrolled with retinopathy 06/10/2006    Wynelle Fanny, PTA 10/03/2016, 3:51 PM  [name] [address] [place], [name], [address] Phone: [phone]   Fax:  [phone]  Name: Luis Yu MRN: [id number] Date of Birth: 21-Mar-1945

## 2016-10-05 ENCOUNTER — Ambulatory Visit: Payer: Medicare Other | Admitting: *Deleted

## 2016-10-05 ENCOUNTER — Other Ambulatory Visit: Payer: Self-pay | Admitting: Adult Health

## 2016-10-05 DIAGNOSIS — I69314 Frontal lobe and executive function deficit following cerebral infarction: Secondary | ICD-10-CM | POA: Diagnosis not present

## 2016-10-05 DIAGNOSIS — F339 Major depressive disorder, recurrent, unspecified: Secondary | ICD-10-CM

## 2016-10-05 DIAGNOSIS — Z760 Encounter for issue of repeat prescription: Secondary | ICD-10-CM

## 2016-10-05 DIAGNOSIS — R2681 Unsteadiness on feet: Secondary | ICD-10-CM | POA: Diagnosis not present

## 2016-10-05 DIAGNOSIS — R260 Ataxic gait: Secondary | ICD-10-CM | POA: Diagnosis not present

## 2016-10-05 DIAGNOSIS — R2689 Other abnormalities of gait and mobility: Secondary | ICD-10-CM

## 2016-10-05 DIAGNOSIS — M6281 Muscle weakness (generalized): Secondary | ICD-10-CM

## 2016-10-05 NOTE — Telephone Encounter (Signed)
Sent to the pharmacy by e-scribe for 90 days.  Pt has upcoming appt with Kandee Keenory on 10/25/16

## 2016-10-05 NOTE — Therapy (Signed)
[name] [place], [name], [address] Phone: [phone]   Fax:  [phone]  Physical Therapy Treatment  Patient Details  Name: Luis Yu MRN: [id number] Date of Birth: [birth year] Referring Provider: Alysia Penna, MD  Encounter Date: 10/05/2016      PT End of Session - 10/05/16 1206    Visit Number 32   Number of Visits 32   Date for PT Re-Evaluation 09/22/16   PT Start Time 1200   PT Stop Time 1251   PT Time Calculation (min) 51 min      Past Medical History:  Diagnosis Date  . Adjustment disorder with mixed emotional features   . Atrial flutter ([place])   . CAD (coronary artery disease)   . Depression   . Diabetes mellitus type II, uncontrolled ([place])   . Diabetic peripheral neuropathy ([place])   . Erectile dysfunction   . GERD (gastroesophageal reflux disease)   . Herpes zoster   . Hyperlipidemia   . Hypertension   . Obesity   . Osteoarthritis   . Postherpetic neuralgia   . Sleep apnea     Past Surgical History:  Procedure Laterality Date  . CARDIOVERSION N/A 11/10/2015   Procedure: CARDIOVERSION;  Surgeon: Larey Dresser, MD;  Location: [facility];  Service: Cardiovascular;  Laterality: N/A;  . CARDIOVERSION N/A 02/21/2016   Procedure: CARDIOVERSION;  Surgeon: Lelon Perla, MD;  Location: [facility] ENDOSCOPY;  Service: Cardiovascular;  Laterality: N/A;  . CORONARY ARTERY BYPASS GRAFT  1996   eith a LIMA to the LAD, saphenous vein graft to the acute marginal, saphenous vein graft to the PDA and saphenous vein graft to the circumflex.  [name] KNEE ARTHROSCOPY  05/2004   right knee  . REPLACEMENT TOTAL KNEE BILATERAL    . SHOULDER SURGERY      There were no vitals filed for this visit.                       [facility] Adult PT Treatment/Exercise - 10/05/16 0001      Knee/Hip Exercises: Aerobic   Stationary Bike L1 x 5 mins   Nustep L5 x15 min, UBE x 5 mins o2% 99      HR   65     Knee/Hip Exercises: Machines for Strengthening   Cybex Knee Extension 20# x 20   Cybex Knee Flexion 40# x 20     Knee/Hip Exercises: Standing   Heel Raises Both;2 sets;10 reps   Hip Flexion Stengthening;Both;2 sets;10 reps;Knee bent   Hip Abduction Both;2 sets;10 reps                  PT Short Term Goals - 09/10/16 1517      PT SHORT TERM GOAL #1   Title Independent with an initial HEP.   Time 4   Period Weeks   Status Achieved     PT SHORT TERM GOAL #2   Title Increase Berg score to 32/56.   Time 4   Period Weeks   Status Achieved           PT Long Term Goals - 10/05/16 1246      PT LONG TERM GOAL #1   Title Independent with an advanced HEP.   Time 8   Period Weeks   Status Achieved     PT LONG TERM GOAL #2   Title Increase bilateral LE strength to a solid 4+/5 to increase stability for functional activites.  Time 8   Period Weeks   Status Achieved     PT LONG TERM GOAL #3   Title --  fatigued   Time 8   Period Weeks   Status Partially Met  380 ft..   NM due to fatigue     PT LONG TERM GOAL #4   Title Perform a reciprocating stair gait with one railing.   Time 8   Period Weeks   Status Not Met  NM due to balance and LE control     PT LONG TERM GOAL #5   Title Increase Berg score to 44-45/56.   Time 8   Period Weeks   Status Not Met  balance deficits               Plan - 10/05/16 1201    Clinical Impression Statement Pt arrived to clinic today doing fairly well. Pt will be DC from PT today and RX focused on independence in therex for HEP and Gym program set-up. He was able to perform all therex  and set-up of machines with minimum help. His O2 remained 92-93% and HR  65 - 70 BPM throughout Rx.  He was able to meet 2.5 LTGs out of 5. Others NM due to fatigue and RT LE control and balance deficits. Pt will join gym program if spouse can convinve him so he can continue to progress   Clinical Impairments Affecting Rehab Potential  Poor balance.  H/o CVA.   PT Frequency 2x / week   PT Duration 8 weeks   PT Treatment/Interventions ADLs/Self Care Home Management;Functional mobility training;Stair training;Gait training;Therapeutic activities;Therapeutic exercise;Balance training;Neuromuscular re-education;Patient/family education   PT Next Visit Plan DC to HEP/Gym program   Consulted and Agree with Plan of Care Patient      Patient will benefit from skilled therapeutic intervention in order to improve the following deficits and impairments:  Abnormal gait, Decreased activity tolerance, Decreased balance, Decreased mobility, Decreased coordination, Decreased strength, Difficulty walking  Visit Diagnosis: Muscle weakness (generalized)  Unsteadiness on feet  Frontal lobe and executive function deficit following cerebral infarction  Medication refill  Depression, recurrent (HCC)  Ataxic gait  Other abnormalities of gait and mobility     Problem List Patient Active Problem List   Diagnosis Date Noted  . Frontal lobe and executive function deficit following cerebral infarction 07/02/2016  . Neurologic gait disorder   . Coronary artery disease involving coronary bypass graft of native heart without angina pectoris   . Benign essential HTN   . Acute kidney injury ([place])   . Nausea & vomiting 06/28/2016  . Ischemic stroke of frontal lobe ([place]) 06/28/2016  . Thrombocytopenia ([name]) 06/28/2016  . Renal failure (ARF), acute on chronic (HCC) 06/28/2016  . Stroke (cerebrum) ([place]) 06/28/2016  . Acute CVA (cerebrovascular accident) ([place]) 06/28/2016  . Insomnia 02/02/2016  . Atrial fibrillation ([place]) 10/27/2015  . Uncoordinated movements 02/24/2014  . Unspecified hereditary and idiopathic peripheral neuropathy 02/24/2013  . Low back pain 02/24/2013  . Abnormality of gait 02/09/2013  . Memory loss, short term 12/05/2011  . LUMBAR STRAIN, ACUTE 05/05/2010  . OSA (obstructive sleep apnea) 09/14/2009  . BPH (benign  prostatic hyperplasia) 07/27/2009  . CARDIOVASCULAR STUDIES, ABNORMAL 07/27/2009  . CHEST PAIN 06/08/2009  . OSTEOARTHRITIS 05/30/2009  . HEEL PAIN, LEFT 05/24/2009  . Dyspnea on exertion 05/24/2009  . ADJUSTMENT DISORDER WITH MIXED FEATURES 08/25/2008  . Diabetes type 2, uncontrolled ([place]) 02/24/2008  . ERECTILE DYSFUNCTION 02/10/2007  . Hyperlipidemia 10/09/2006  .  OBESITY 10/09/2006  . Essential hypertension 10/09/2006  . Coronary atherosclerosis 10/09/2006  . GERD 10/09/2006  . DM (diabetes mellitus) type II uncontrolled with retinopathy 06/10/2006    Jaclyne Haverstick,CHRIS , PTA 10/05/2016, 1:25 PM  [facility] [address] [place], [name], [address] Phone: [phone]   Fax:  [phone]  Name: Luis Yu MRN: [id number] Date of Birth: December 20, 1945

## 2016-10-10 ENCOUNTER — Telehealth: Payer: Self-pay | Admitting: Cardiology

## 2016-10-10 ENCOUNTER — Telehealth: Payer: Self-pay | Admitting: Family Medicine

## 2016-10-10 ENCOUNTER — Other Ambulatory Visit: Payer: Self-pay | Admitting: Adult Health

## 2016-10-10 DIAGNOSIS — IMO0002 Reserved for concepts with insufficient information to code with codable children: Secondary | ICD-10-CM

## 2016-10-10 DIAGNOSIS — E1165 Type 2 diabetes mellitus with hyperglycemia: Principal | ICD-10-CM

## 2016-10-10 DIAGNOSIS — Z794 Long term (current) use of insulin: Principal | ICD-10-CM

## 2016-10-10 DIAGNOSIS — Z760 Encounter for issue of repeat prescription: Secondary | ICD-10-CM

## 2016-10-10 DIAGNOSIS — E1140 Type 2 diabetes mellitus with diabetic neuropathy, unspecified: Secondary | ICD-10-CM

## 2016-10-10 NOTE — Telephone Encounter (Signed)
Pt c/o Shortness Of Breath: STAT if SOB developed within the last 24 hours or pt is noticeably SOB on the phone  1. Are you currently SOB (can you hear that pt is SOB on the phone)? No, Patient alseep  2. How long have you been experiencing SOB? Almost a year   3. Are you SOB when sitting or when up moving around? both  4. Are you currently experiencing any other symptoms? Patient also has afib and has had this go on for awhile but patient feels like the SOB is getting worse.  Patient is scheduled with Lisabeth DevoidMeng on  10-24-16.

## 2016-10-10 NOTE — Telephone Encounter (Signed)
Spoke to the pt's wife and she states that medication is not needed at this time.  Still have medication at home.  Medication denied.  They will call pharmacy when a refill is needed.

## 2016-10-10 NOTE — Telephone Encounter (Signed)
Returned call to wife. She states patient has permanent AF and has SOB. She states patient gets very worked up about his issues and almost hyperventilates d/t this. An appointment was scheduled on August 8 [place]. No acute concerns, wife just wanted MD to be aware.

## 2016-10-10 NOTE — Telephone Encounter (Signed)
Spoke with pt's wife. Advised to continue bystolic until August appt.   Medication samples have been provided to the patient.  Drug name: bystolic 20mg   Qty: 4 bottles  LOT: [id number]  Exp.Date: 10/18  Samples left at front desk for patient pick-up. Patient notified.  Julaine Fusilkins, Jenna M 5:07 PM 10/10/2016

## 2016-10-10 NOTE — Telephone Encounter (Signed)
Spoke to the pt's wife and informed her to have pt restart glipizide.  While on the phone she mentioned that they talked to pharmacist from insurance.  They were notified that buPROPion (WELLBUTRIN SR) 150 MG 12 hr tablet and mirtazapine (REMERON) 30 MG tablet combination can cause weakness.  Also notified that long term use of atorvastatin (LIPITOR) 80 MG tablet can cause weakness.  Would like to know if a medication change is recommended. Advised that Kandee KeenCory may wait to speak with pt when he comes in for appt on 10/25/16 (30 min appt) and wife agreed.

## 2016-10-10 NOTE — Telephone Encounter (Signed)
Patient calling the office for samples of medication:   1.  What medication and dosage are you requesting samples for? Eliquis 5 mg   2.  Are you currently out of this medication? Almost out of medication, 4 more days left

## 2016-10-10 NOTE — Telephone Encounter (Signed)
Medication samples have been provided to the patient.  Drug name: eliquis 5mg   Qty: 4 boxes   LOT: [id number]  Exp.Date: 02/2019  Samples left at front desk for patient pick-up. Patient notified.  Julaine Fusilkins, Jenna M 5:08 PM 10/10/2016

## 2016-10-10 NOTE — Telephone Encounter (Signed)
Noted. Yes I will speak with them

## 2016-10-10 NOTE — Telephone Encounter (Signed)
This looks like it was discontinued in the hospital.  Please look behind me.

## 2016-10-10 NOTE — Telephone Encounter (Signed)
He should probably be on this medication. It is ok to refill

## 2016-10-10 NOTE — Telephone Encounter (Signed)
Patient calling, states that patient's medication bystolic 20 mg is too expensive and would like to know if patient can be switched to metoprolol.

## 2016-10-12 ENCOUNTER — Other Ambulatory Visit: Payer: Self-pay | Admitting: *Deleted

## 2016-10-12 NOTE — Patient Outreach (Signed)
[facility] Care Management  10/12/2016  Luis ImusDonald W Yu Feb 19, 1946 [id number]   CSW will perform a case closure on patient, as CSW has been unable to maintain contact with patient/wife. CSW has attempted to reach patient 3 times in the past 2 weeks and left voicemails with no returned call.   CSW will fax an update to patient's Primary Care Provider, Luis Freesory Nafziger, Luis Yu to ensure that they are aware of CSW's involvement with patient's plan of care. CSW will submit a case closure request to Luis Yu, Care Management Assistant with [facility] in the form of an inbasket message.    Luis MaxinKelly Shatonia Hoots, LCSW [facility]  Clinical Social Worker cell #: [phone]

## 2016-10-19 ENCOUNTER — Telehealth: Payer: Self-pay | Admitting: Adult Health

## 2016-10-19 DIAGNOSIS — G4733 Obstructive sleep apnea (adult) (pediatric): Secondary | ICD-10-CM

## 2016-10-19 NOTE — Telephone Encounter (Signed)
Change to auto ASV settings Minimum EPAP 7 cm, pressure support minimum 4 cm & maximum 15 cm -Repeat download in 2 weeks

## 2016-10-19 NOTE — Telephone Encounter (Signed)
Called and spoke to pt's wife, Luis Yu. Pt's wife states the pt's CPAP machine is shutting off while sleeping. Luis Yu [facility] and [facility] that the pt's breath is too shallow. Pt is only wearing the CPAP 3-4 nights per week. Pt states he feels he is smothering when he wears the CPAP. A CPAP DL has been printed and placed in RA's look-ats. [name] verbalized understanding.   Dr. Vassie LollAlva please advise. Thanks.

## 2016-10-19 NOTE — Telephone Encounter (Signed)
Called and spoke to [place]. Informed her of the recs per RA. Order placed. Pt verbalized understanding and denied any further questions or concerns at this time.   Will forward to Cherina to get a DL in 2 weeks from pressure change.

## 2016-10-22 ENCOUNTER — Encounter: Payer: Self-pay | Admitting: *Deleted

## 2016-10-22 NOTE — Telephone Encounter (Signed)
This encounter was created in error - please disregard.

## 2016-10-23 ENCOUNTER — Telehealth: Payer: Self-pay | Admitting: Adult Health

## 2016-10-23 MED ORDER — LOSARTAN POTASSIUM-HCTZ 50-12.5 MG PO TABS
1.0000 | ORAL_TABLET | Freq: Every day | ORAL | 0 refills | Status: DC
Start: 2016-10-23 — End: 2016-11-20

## 2016-10-23 NOTE — Telephone Encounter (Signed)
Ok to send in Hyzaar 50-12.5 mg x 30 days. Please follow up in 2 weeks for blood pressure check

## 2016-10-23 NOTE — Telephone Encounter (Signed)
Medication sent to the pharmacy and informed pt's wife.  Scheduled him to come back for follow up 11/08/16

## 2016-10-23 NOTE — Telephone Encounter (Signed)
° ° ° °  Pt take HCTZ  80-12.5 and the pharmacy told them about the recall and they are requesting a new med. Would like a call back   [facility]

## 2016-10-24 ENCOUNTER — Encounter: Payer: Self-pay | Admitting: Physician Assistant

## 2016-10-24 ENCOUNTER — Ambulatory Visit (INDEPENDENT_AMBULATORY_CARE_PROVIDER_SITE_OTHER): Payer: Medicare Other | Admitting: Physician Assistant

## 2016-10-24 VITALS — BP 120/72 | HR 60 | Wt 227.0 lb

## 2016-10-24 DIAGNOSIS — I481 Persistent atrial fibrillation: Secondary | ICD-10-CM | POA: Diagnosis not present

## 2016-10-24 DIAGNOSIS — I639 Cerebral infarction, unspecified: Secondary | ICD-10-CM

## 2016-10-24 DIAGNOSIS — I4819 Other persistent atrial fibrillation: Secondary | ICD-10-CM

## 2016-10-24 DIAGNOSIS — R0609 Other forms of dyspnea: Secondary | ICD-10-CM | POA: Diagnosis not present

## 2016-10-24 DIAGNOSIS — I10 Essential (primary) hypertension: Secondary | ICD-10-CM

## 2016-10-24 DIAGNOSIS — I25810 Atherosclerosis of coronary artery bypass graft(s) without angina pectoris: Secondary | ICD-10-CM

## 2016-10-24 DIAGNOSIS — E785 Hyperlipidemia, unspecified: Secondary | ICD-10-CM | POA: Diagnosis not present

## 2016-10-24 DIAGNOSIS — E119 Type 2 diabetes mellitus without complications: Secondary | ICD-10-CM | POA: Diagnosis not present

## 2016-10-24 NOTE — Progress Notes (Addendum)
Cardiology Office Note    Date:  10/26/2016   ID:  Luis, Yu 06/17/1945, MRN [id number]  PCP:  Shirline Frees, NP  Cardiologist:  Dr. Jens Som   Chief Complaint  Patient presents with  . Follow-up    seen for Dr. Jens Som    History of Present Illness:  Luis Yu is a 71 y.o. male with PMH of DM II, HTN, HLD, OSA, CAD and atrial flutter/afib. He had previous PCI of SVG to PDA. Cardiac catheterization in 2011 revealed EF 50%, continued patency of the LIMA to LAD, occluded SVG to the previously stented distal left circumflex/PDA territory. Patient was evaluated by CV TTS, distal vessel was felt to be poor targets, CABG could be considered in the future if PCI option were exhausted. He had PCI of SVG to OM. Myoview obtained in August 2017 showed EF 48%, no ischemia or infarction. He had a cardioversion in August 2017 and December 2017. He was seen by Dr. Johney Frame for consideration of ablation and rate control was recommended. Tikosyn could be considered. He had a CVA in April 2018. Repeat echocardiogram in April 2018 showed a normal LV systolic function, moderate LV H, mild biatrial enlargement, mild mitral regurgitation and moderately elevated pulmonary pressure. Carotid ultrasound in April 2018 showed mild disease bilaterally. He was last seen by Dr. Jens Som on 07/24/2016. He was properly anticoagulated with eliquis and currently on rate control. 6 month follow-up was recommended  Based on office phone note on 10/10/2016, patient was complaining of shortness of breath for almost a year. He presents today with his wife for cardiology office evaluation. He denies any chest discomfort. He has been feeling weak and short of breath for quite a while. He does not have any lower extremity edema, orthopnea or PND. There currently is discharged on the whole, we give him adequate samples. He is also on BystolicAnd could not afford the medication. We did give him some nebivolol samples as well,  however they're unable to afford anymore medication until next year as Bystolic cost several hundred dollars. Currently EPIC system is down, I wrote paper Rx for his metoprolol 25mg  BID. I will bring him back in one month for medication titration.   Past Medical History:  Diagnosis Date  . Adjustment disorder with mixed emotional features   . Atrial flutter (HCC)   . CAD (coronary artery disease)   . Depression   . Diabetes mellitus type II, uncontrolled (HCC)   . Diabetic peripheral neuropathy (HCC)   . Erectile dysfunction   . GERD (gastroesophageal reflux disease)   . Herpes zoster   . Hyperlipidemia   . Hypertension   . Obesity   . Osteoarthritis   . Postherpetic neuralgia   . Sleep apnea     Past Surgical History:  Procedure Laterality Date  . CARDIOVERSION N/A 11/10/2015   Procedure: CARDIOVERSION;  Surgeon: Laurey Morale, MD;  Location: [facility] ENDOSCOPY;  Service: Cardiovascular;  Laterality: N/A;  . CARDIOVERSION N/A 02/21/2016   Procedure: CARDIOVERSION;  Surgeon: Lewayne Bunting, MD;  Location: [facility] ENDOSCOPY;  Service: Cardiovascular;  Laterality: N/A;  . CORONARY ARTERY BYPASS GRAFT  1996   eith a LIMA to the LAD, saphenous vein graft to the acute marginal, saphenous vein graft to the PDA and saphenous vein graft to the circumflex.  [name] KNEE ARTHROSCOPY  05/2004   right knee  . REPLACEMENT TOTAL KNEE BILATERAL    . SHOULDER SURGERY      Current Medications: Outpatient  Medications Prior to Visit  Medication Sig Dispense Refill  . acetaminophen (TYLENOL) 500 MG tablet Take 2 tablets (1,000 mg total) by mouth every 6 (six) hours as needed. (Patient taking differently: Take 1,000 mg by mouth every 6 (six) hours as needed for mild pain. ) 30 tablet 0  . apixaban (ELIQUIS) 5 MG TABS tablet Take 1 tablet (5 mg total) by mouth 2 (two) times daily. 180 tablet 3  . aspirin EC 81 MG tablet Take 1 tablet (81 mg total) by mouth daily.    [name] atorvastatin (LIPITOR) 80 MG tablet Take 1  tablet (80 mg total) by mouth daily. 90 tablet 3  . buPROPion (WELLBUTRIN SR) 150 MG 12 hr tablet Take 1 tablet (150 mg total) by mouth 2 (two) times daily. 180 tablet 1  . glipiZIDE (GLUCOTROL) 10 MG tablet Take 10 mg by mouth daily.    [name] glucose blood (ACCU-CHEK ACTIVE STRIPS) test strip Use as instructed 100 each 12  . Insulin Glargine (BASAGLAR KWIKPEN) 100 UNIT/ML SOPN Inject 0.25 mLs (25 Units total) into the skin at bedtime. (Patient taking differently: Inject 20 Units into the skin at bedtime. ) 3 pen 11  . Insulin Pen Needle (PEN NEEDLES 31GX5/16") 31G X 8 MM MISC Use to inject insulin 5 times a day. 100 each 11  . isosorbide mononitrate (IMDUR) 30 MG 24 hr tablet Take 1 tablet (30 mg total) by mouth daily. 90 tablet 3  . Lancets (ACCU-CHEK MULTICLIX) lancets Use up to 5 times a day to check blood sugar. 100 each 5  . losartan-hydrochlorothiazide (HYZAAR) 50-12.5 MG tablet Take 1 tablet by mouth daily. 30 tablet 0  . metFORMIN (GLUCOPHAGE) 500 MG tablet Take 1 tablet (500 mg total) by mouth 2 (two) times daily with a meal. 60 tablet 1  . mirtazapine (REMERON) 30 MG tablet Take 1 tablet (30 mg total) by mouth at bedtime. 30 tablet 3  . nebivolol 20 MG TABS Take 1 tablet (20 mg total) by mouth daily. 30 tablet 1  . omeprazole (PRILOSEC) 20 MG capsule TAKE ONE CAPSULE TWICE A DAY BEFORE A MEAL 180 capsule 0  . thiamine 100 MG tablet Take 1 tablet (100 mg total) by mouth daily. 90 tablet 3  . vitamin B-12 (CYANOCOBALAMIN) 1000 MCG tablet Take 2 tablets (2,000 mcg total) by mouth daily. 180 tablet 3  . buPROPion (WELLBUTRIN SR) 150 MG 12 hr tablet TAKE 1 TABLET BY MOUTH TWICE A DAY 60 tablet 5  . insulin aspart (NOVOLOG) 100 UNIT/ML injection Correction coverage: Sensitive (thin, NPO, renal)  CBG < 70: implement hypoglycemia protocol  CBG 70 - 120: 0 units  CBG 121 - 150: 1 unit  CBG 151 - 200: 2 units  CBG 201 - 250: 3 units  CBG 251 - 300: 5 units  CBG 301 - 350: 7 units  CBG 351 -  400 9 units  CBG > 400 call MD and obtain STAT lab verification 10 mL 11  . omeprazole (PRILOSEC) 20 MG capsule TAKE ONE CAPSULE TWICE A DAY BEFORE A MEAL 180 capsule 0   No facility-administered medications prior to visit.      Allergies:   Patient has no known allergies.   Social History   Social History  . Marital status: Married    Spouse name: [name]  . Number of children: 1  . Years of education: 83   Occupational History  . Retired Naval architect Retired   Social History Main Topics  . Smoking  status: Never Smoker  . Smokeless tobacco: Never Used  . Alcohol use 1.2 oz/week    2 Standard drinks or equivalent per week     Comment: twice a month.  . Drug use: No  . Sexual activity: Yes    Partners: Female   Other Topics Concern  . None   Social History Narrative   Married Counselling psychologist)   Regular exercise: none   Caffeine use: cup of coffee daily    Retired Naval architect   Caffeine- twice daily              Family History:  The patient's family history includes Coronary artery disease in his father and mother; Stroke in his brother and unknown relative.   ROS:   Please see the history of present illness.    ROS All other systems reviewed and are negative.   PHYSICAL EXAM:   VS:  BP 120/72   Pulse 60   Wt 227 lb (103 kg)   BMI 29.15 kg/m    GEN: Well nourished, well developed, in no acute distress  HEENT: normal  Neck: no JVD, carotid bruits, or masses Cardiac: irregular; no murmurs, rubs, or gallops,no edema  Respiratory:  clear to auscultation bilaterally, normal work of breathing GI: soft, nontender, nondistended, + BS MS: no deformity or atrophy  Skin: warm and dry, no rash Neuro:  Alert and Oriented x 3, Strength and sensation are intact Psych: euthymic mood, full affect  Wt Readings from Last 3 Encounters:  10/25/16 228 lb (103.4 kg)  10/24/16 227 lb (103 kg)  09/11/16 229 lb 4.8 oz (104 kg)      Studies/Labs Reviewed:   EKG:  EKG is not  ordered today.    Recent Labs: 11/01/2015: Brain Natriuretic Peptide 195.2 02/15/2016: Pro B Natriuretic peptide (BNP) 247.0 06/28/2016: TSH 0.983 07/03/2016: ALT 33 07/17/2016: BUN 23; Creatinine, Ser 1.14; Hemoglobin 12.7; Platelets 218.0; Potassium 4.8; Sodium 141   Lipid Panel    Component Value Date/Time   CHOL 96 06/28/2016 0718   TRIG 88 06/28/2016 0718   HDL 28 (L) 06/28/2016 0718   CHOLHDL 3.4 06/28/2016 0718   VLDL 18 06/28/2016 0718   LDLCALC 50 06/28/2016 0718    Additional studies/ records that were reviewed today include:   Myoview 10/20/2015 Study Highlights     Nuclear stress EF: 48%.  The left ventricular ejection fraction is mildly decreased (45-54%).  There was no ST segment deviation noted during stress.  This is a low risk study.   Normal resting and stress perfusion. No ischemia or infarction EF 48% but may not be accurate due to afib     Carotid US 06/28/2016 Findings are suggestive of 1-39% stenosis in bilateral internal    carotid arteries   Echo 06/30/2016 LV EF: 55% -   60%  - Left ventricle: Wall thickness was increased in a pattern of   moderate LVH. Systolic function was normal. The estimated   ejection fraction was in the range of 55% to 60%. Wall motion was   normal; there were no regional wall motion abnormalities. - Mitral valve: Mildly calcified annulus. There was mild   regurgitation. - Left atrium: The atrium was mildly dilated. - Right atrium: The atrium was mildly dilated. - Pulmonary arteries: Systolic pressure was moderately increased.   PA peak pressure: 42 mm Hg (S).   ASSESSMENT:    1. DOE (dyspnea on exertion)   2. Persistent atrial fibrillation (HCC)   3. Coronary artery disease  involving coronary bypass graft of native heart without angina pectoris   4. Essential hypertension   5. Hyperlipidemia, unspecified hyperlipidemia type   6. Controlled type 2 diabetes mellitus without complication, without long-term  current use of insulin (HCC)      PLAN:  In order of problems listed above:  1. DOE: He has many reason to be short of breath, he is still recovering from the stroke. He has significant balance issues and have difficulty moving from room to room. Most recent echocardiogram obtained on 06/30/2016 showed EF 55-60%, I do not think at this point there's a reason to repeat echocardiogram. He does not have any obvious angina symptom. On physical exam, he does not have any heart failure symptoms either. I recommended continue with physical therapy.  2. Persistent atrial fibrillation: On eliquis, we did have to give him samples. Unfortunately, he has been taking nebivolol for many years but they're currently in donor whole and nebivolol is too expensive for them. I will switch this to metoprolol 25 mg twice a day and uptitrate as his blood pressure allow. We did give him some samples for nebivolol today, after finishing the current samples, he was switched to metoprolol.   3. CAD s/p CABG: No obvious chest discomfort, continue Imdur for antianginal purposes  4. HTN: Blood pressure well controlled, switched his nebivolol to metoprolol 25 mg twice a day we'll continue to uptitrate metoprolol as blood pressure able to tolerate.  5. HLD: On Lipitor 80 mg daily   6. DM II: On glipizide    Medication Adjustments/Labs and Tests Ordered: Current medicines are reviewed at length with the patient today.  Concerns regarding medicines are outlined above.  Medication changes, Labs and Tests ordered today are listed in the Patient Instructions below. Patient Instructions  Medication Instructions:   STOP Bystolic START Metoprolol tartrate 25mg  TWICE DAILY You have been provided with a paper script for this medication. You may take the Bystolic until your current bottle runs out and start the metoprolol afterwards.  Please check your heart rate daily. Call us if your heart rate goes above 100 beats per minute  AT REST.  Labwork:   None  Testing/Procedures:  None  Follow-Up:  Keep follow up visit with Dr. Jens Somrenshaw as scheduled.   If you need a refill on your cardiac medications before your next appointment, please call your pharmacy.      Ramond DialSigned, Shantika Bermea, [place]  10/26/2016 2:25 PM    [facility] [address], [place], [place]  [address] Phone: [phone]) [phone]; Fax: [phone]

## 2016-10-25 ENCOUNTER — Ambulatory Visit (INDEPENDENT_AMBULATORY_CARE_PROVIDER_SITE_OTHER): Payer: Medicare Other | Admitting: Adult Health

## 2016-10-25 ENCOUNTER — Encounter: Payer: Self-pay | Admitting: Adult Health

## 2016-10-25 VITALS — BP 128/72 | Ht 74.0 in | Wt 228.0 lb

## 2016-10-25 DIAGNOSIS — IMO0002 Reserved for concepts with insufficient information to code with codable children: Secondary | ICD-10-CM

## 2016-10-25 DIAGNOSIS — I639 Cerebral infarction, unspecified: Secondary | ICD-10-CM | POA: Diagnosis not present

## 2016-10-25 DIAGNOSIS — E1165 Type 2 diabetes mellitus with hyperglycemia: Secondary | ICD-10-CM | POA: Diagnosis not present

## 2016-10-25 DIAGNOSIS — E1140 Type 2 diabetes mellitus with diabetic neuropathy, unspecified: Secondary | ICD-10-CM | POA: Diagnosis not present

## 2016-10-25 DIAGNOSIS — Z794 Long term (current) use of insulin: Secondary | ICD-10-CM | POA: Diagnosis not present

## 2016-10-25 DIAGNOSIS — I10 Essential (primary) hypertension: Secondary | ICD-10-CM | POA: Diagnosis not present

## 2016-10-25 NOTE — Progress Notes (Signed)
Subjective:    Patient ID: Luis Yu, male    DOB: 1946/01/27, 71 y.o.   MRN: [id number]  HPI  71 year old male who  has a past medical history of Adjustment disorder with mixed emotional features; Atrial flutter (HCC); CAD (coronary artery disease); Depression; Diabetes mellitus type II, uncontrolled (HCC); Diabetic peripheral neuropathy (HCC); Erectile dysfunction; GERD (gastroesophageal reflux disease); Herpes zoster; Hyperlipidemia; Hypertension; Obesity; Osteoarthritis; Postherpetic neuralgia; and Sleep apnea.   He presents today for follow up regarding diabetes. He current regimen includes that of Metformin 500mg  BID, glipizide 10 mg daily , Basaglar 22-25 units at night, and Novolog sliding scale . He has not had any hypoglycemic episodes but has had multiple readings in the 200's over the last two months. His last A1c in 06/2016 was 7.6    We had also recently changed his blood pressure medication from valsartan-hctz to Hyzaar due to medication recall. Today in the office his blood pressure is 128/72.    Review of Systems See HPI   Past Medical History:  Diagnosis Date  . Adjustment disorder with mixed emotional features   . Atrial flutter (HCC)   . CAD (coronary artery disease)   . Depression   . Diabetes mellitus type II, uncontrolled (HCC)   . Diabetic peripheral neuropathy (HCC)   . Erectile dysfunction   . GERD (gastroesophageal reflux disease)   . Herpes zoster   . Hyperlipidemia   . Hypertension   . Obesity   . Osteoarthritis   . Postherpetic neuralgia   . Sleep apnea     Social History   Social History  . Marital status: Married    Spouse name: [name]  . Number of children: 1  . Years of education: 5713   Occupational History  . Retired Naval architecttruck driver Retired   Social History Main Topics  . Smoking status: Never Smoker  . Smokeless tobacco: Never Used  . Alcohol use 1.2 oz/week    2 Standard drinks or equivalent per week     Comment: twice a  month.  . Drug use: No  . Sexual activity: Yes    Partners: Female   Other Topics Concern  . Not on file   Social History Narrative   Married [name]([name])   Regular exercise: none   Caffeine use: cup of coffee daily    Retired Naval architecttruck driver   Caffeine- twice daily             Past Surgical History:  Procedure Laterality Date  . CARDIOVERSION N/A 11/10/2015   Procedure: CARDIOVERSION;  Surgeon: Laurey Moralealton S McLean, MD;  [facility];  Service: Cardiovascular;  Laterality: N/A;  . CARDIOVERSION N/A 02/21/2016   Procedure: CARDIOVERSION;  Surgeon: Lewayne BuntingBrian S Crenshaw, MD;  [facility];  Service: Cardiovascular;  Laterality: N/A;  . CORONARY ARTERY BYPASS GRAFT  1996   eith a LIMA to the LAD, saphenous vein graft to the acute marginal, saphenous vein graft to the PDA and saphenous vein graft to the circumflex.  [name]. KNEE ARTHROSCOPY  05/2004   right knee  . REPLACEMENT TOTAL KNEE BILATERAL    . SHOULDER SURGERY      Family History  Problem Relation Age of Onset  . Coronary artery disease Mother        deceased at 3375  . Coronary artery disease Father        died age 72  . Stroke Unknown        half brother  . Stroke  Brother     No Known Allergies  Current Outpatient Prescriptions on File Prior to Visit  Medication Sig Dispense Refill  . acetaminophen (TYLENOL) 500 MG tablet Take 2 tablets (1,000 mg total) by mouth every 6 (six) hours as needed. (Patient taking differently: Take 1,000 mg by mouth every 6 (six) hours as needed for mild pain. ) 30 tablet 0  . apixaban (ELIQUIS) 5 MG TABS tablet Take 1 tablet (5 mg total) by mouth 2 (two) times daily. 180 tablet 3  . aspirin EC 81 MG tablet Take 1 tablet (81 mg total) by mouth daily.    [name] atorvastatin (LIPITOR) 80 MG tablet Take 1 tablet (80 mg total) by mouth daily. 90 tablet 3  . buPROPion (WELLBUTRIN SR) 150 MG 12 hr tablet Take 1 tablet (150 mg total) by mouth 2 (two) times daily. 180 tablet 1  . glipiZIDE  (GLUCOTROL) 10 MG tablet Take 10 mg by mouth daily.    [name] glucose blood (ACCU-CHEK ACTIVE STRIPS) test strip Use as instructed 100 each 12  . Insulin Glargine (BASAGLAR KWIKPEN) 100 UNIT/ML SOPN Inject 0.25 mLs (25 Units total) into the skin at bedtime. (Patient taking differently: Inject 20 Units into the skin at bedtime. ) 3 pen 11  . Insulin Pen Needle (PEN NEEDLES 31GX5/16") 31G X 8 MM MISC Use to inject insulin 5 times a day. 100 each 11  . isosorbide mononitrate (IMDUR) 30 MG 24 hr tablet Take 1 tablet (30 mg total) by mouth daily. 90 tablet 3  . Lancets (ACCU-CHEK MULTICLIX) lancets Use up to 5 times a day to check blood sugar. 100 each 5  . losartan-hydrochlorothiazide (HYZAAR) 50-12.5 MG tablet Take 1 tablet by mouth daily. 30 tablet 0  . metFORMIN (GLUCOPHAGE) 500 MG tablet Take 1 tablet (500 mg total) by mouth 2 (two) times daily with a meal. 60 tablet 1  . mirtazapine (REMERON) 30 MG tablet Take 1 tablet (30 mg total) by mouth at bedtime. 30 tablet 3  . nebivolol 20 MG TABS Take 1 tablet (20 mg total) by mouth daily. 30 tablet 1  . omeprazole (PRILOSEC) 20 MG capsule TAKE ONE CAPSULE TWICE A DAY BEFORE A MEAL 180 capsule 0  . thiamine 100 MG tablet Take 1 tablet (100 mg total) by mouth daily. 90 tablet 3  . vitamin B-12 (CYANOCOBALAMIN) 1000 MCG tablet Take 2 tablets (2,000 mcg total) by mouth daily. 180 tablet 3   No current facility-administered medications on file prior to visit.     BP 128/72   Ht 6\' 2"  (1.88 m)   Wt 228 lb (103.4 kg)   BMI 29.27 kg/m      Objective:   Physical Exam  Constitutional: He is oriented to person, place, and time. He appears well-developed and well-nourished. No distress.  Cardiovascular: Normal rate, regular rhythm, normal heart sounds and intact distal pulses.   Pulmonary/Chest: Effort normal and breath sounds normal. No respiratory distress. He has no wheezes. He has no rales. He exhibits no tenderness.  Musculoskeletal:  Steady gait with  rolling walker  Neurological: He is alert and oriented to person, place, and time.  Skin: He is not diaphoretic.  Nursing note and vitals reviewed.     Assessment & Plan:  1. Uncontrolled type 2 diabetes mellitus with diabetic neuropathy, with long-term current use of insulin (HCC) - POC HgB A1c- Has improved to 7.2  - No change in medication at this time   2. Essential hypertension - At goal. No  change in medication at this time   Shirline Frees, NP

## 2016-10-25 NOTE — Patient Instructions (Signed)
Medication Instructions:   STOP Bystolic START Metoprolol tartrate 25mg  TWICE DAILY You have been provided with a paper script for this medication. You may take the Bystolic until your current bottle runs out and start the metoprolol afterwards.  Please check your heart rate daily. Call us if your heart rate goes above 100 beats per minute AT REST.  Labwork:   None  Testing/Procedures:  None  Follow-Up:  Keep follow up visit with Dr. Jens Somrenshaw as scheduled.   If you need a refill on your cardiac medications before your next appointment, please call your pharmacy.

## 2016-10-26 ENCOUNTER — Telehealth: Payer: Self-pay | Admitting: Pulmonary Disease

## 2016-10-26 ENCOUNTER — Encounter: Payer: Self-pay | Admitting: Physician Assistant

## 2016-10-26 DIAGNOSIS — G4731 Primary central sleep apnea: Secondary | ICD-10-CM

## 2016-10-26 LAB — POCT GLYCOSYLATED HEMOGLOBIN (HGB A1C): Hemoglobin A1C: 7.2

## 2016-10-26 NOTE — Telephone Encounter (Signed)
Order has been changed

## 2016-10-26 NOTE — Telephone Encounter (Signed)
Okay to take min out of the order Fayrene FearingJames diagnosis to complex sleep apnea

## 2016-10-26 NOTE — Telephone Encounter (Signed)
Call[facility] and [facility]---  The order was sent in back 03-2016 for the pt to get the ASV but the did not get this.  Order was recently sent in for a change to the min EPAP---Rodney stated that they cannot do this since the pt did not have the ASV.  They wanted to see if this order can be fixed by taking the min out of the most recent order and will need to change the DX from OSA to one that will be covered by the ASV.  RA please advise. Thanks

## 2016-10-29 ENCOUNTER — Telehealth: Payer: Self-pay | Admitting: Adult Health

## 2016-10-29 DIAGNOSIS — G4731 Primary central sleep apnea: Secondary | ICD-10-CM

## 2016-10-29 NOTE — Telephone Encounter (Signed)
[name] w/ [facility] called requesting to speak with a team lead  Upon inspection of pt's chart and discussion with [name], an order was placed on 1.25.18 but order was very confusing > stated the machine needed was an ASV but the settings were for a BiPAP.  Pt was given a BiPAP machine instead of an ASV  Per all ov's since that order was placed, RA was under the assumption that pt was given an ASV Per [name], the 8.10.18 phone note still did not correct the issue > the diagnosis is correct but the order is still incorrect where the settings are for BiPAP.  After an extensive conversation with [name] the question is this: Dr Vassie LollAlva, this entire time the patient has been on a BiPAP machine NOT an ASV.  Should patient continue the BiPAP and settings be changed to fit this type of device, or would you like everything to be changed/corrected so that patient will actually receive an ASV?    Routing to RA as urgent so that this issue can be resolved  **please route this message back to Shanda BumpsJessica so that I can ensure the order is done completely and correctly**

## 2016-10-30 NOTE — Addendum Note (Signed)
Addended by: Boone MasterJONES, Lyann Hagstrom E on: 10/30/2016 09:58 AM   Modules accepted: Orders

## 2016-10-30 NOTE — Telephone Encounter (Signed)
Order placed to change patient from his current BiPAP machine to ASV as was originally ordered from 03/2016 titration study.  [name] w/ [facility] is aware order has been placed. Nothing further needed; will sign off.

## 2016-10-30 NOTE — Telephone Encounter (Signed)
  Explains why his events were not resolved on his follow-up download on last 2 visits  Based on his last titration study 03/2016 Please change to A SV - EPAP 7 cmH2O, Pressure Support Min 4 and Max 15 cmH2O

## 2016-11-01 ENCOUNTER — Ambulatory Visit (INDEPENDENT_AMBULATORY_CARE_PROVIDER_SITE_OTHER): Payer: Medicare Other | Admitting: Podiatry

## 2016-11-01 ENCOUNTER — Encounter: Payer: Self-pay | Admitting: Podiatry

## 2016-11-01 DIAGNOSIS — B351 Tinea unguium: Secondary | ICD-10-CM

## 2016-11-01 DIAGNOSIS — M79609 Pain in unspecified limb: Secondary | ICD-10-CM | POA: Diagnosis not present

## 2016-11-01 DIAGNOSIS — R2681 Unsteadiness on feet: Secondary | ICD-10-CM

## 2016-11-01 DIAGNOSIS — Z794 Long term (current) use of insulin: Secondary | ICD-10-CM

## 2016-11-01 DIAGNOSIS — E1140 Type 2 diabetes mellitus with diabetic neuropathy, unspecified: Secondary | ICD-10-CM

## 2016-11-01 NOTE — Progress Notes (Signed)
Patient ID: Luis ImusDonald W Yu, male   DOB: [birth year], 71 y.o.   MRN: [id number] Complaint:  Visit Type: Patient returns to my office for continued preventative foot care services. Complaint: Patient states" my nails have grown long and thick and become painful to walk and wear shoes" Patient has been diagnosed with DM with neuropathy. Patient has history of fracture right hallux.  He was  Fitted for braces and he was told to be evaluated by myself for possible infection.  Podiatric Exam: Vascular: dorsalis pedis and posterior tibial pulses are palpable bilateral. Capillary return is immediate. Temperature gradient is WNL. Skin turgor WNL  Sensorium: Normal Semmes Weinstein monofilament test. Normal tactile sensation bilaterally. Nail Exam: Pt has thick disfigured discolored nails with subungual debris noted bilateral entire nail hallux through fifth toenails Ulcer Exam: There is no evidence of ulcer or pre-ulcerative changes or infection. Orthopedic Exam: Muscle tone and strength are WNL. No limitations in general ROM. No crepitus or effusions noted. HAV 1st MPJ  B/L.  S/P fracture great toe right foot. Skin: No Porokeratosis. No infection or ulcers.  Right hallux was normal color with no increased temperature.   Diagnosis:  Onychomycosis, , Pain in right toe, pain in left toes  S/P hallux fracture.  Treatment & Plan Procedures and Treatment: Consent by patient was obtained for treatment procedures. The patient understood the discussion of treatment and procedures well. All questions were answered thoroughly reviewed. Debridement of mycotic and hypertrophic toenails, 1 through 5 bilateral and clearing of subungual debris. No ulceration, no infection noted.   Return Visit-Office Procedure: Patient instructed to return to the office for a follow up visit 3 months for continued evaluation and treatment.    Helane GuntherGregory Daisja Kessinger DPM

## 2016-11-07 ENCOUNTER — Telehealth: Payer: Self-pay | Admitting: Adult Health

## 2016-11-07 NOTE — Telephone Encounter (Signed)
Pts wife is calling in reference to the 03/28/16 visit for pt.  Pt was billed $260.00 and pt has Medicare and [employer] and not sure why it was not covered.  Wife not sure if it needs to be re-coded per billing.

## 2016-11-08 ENCOUNTER — Telehealth: Payer: Self-pay | Admitting: Pulmonary Disease

## 2016-11-08 ENCOUNTER — Ambulatory Visit: Payer: Medicare Other | Admitting: Adult Health

## 2016-11-08 NOTE — Telephone Encounter (Signed)
Called spoke with patient's spouse Luis Yu who is aware of pt's pending ASV and why he is being switched from BiPAP.  Dr Vassie Loll, Luis Yu does have 1 concern regarding the ASV >> she read that an ASV is "not good" for patients with heart conditions and she's worried about his chronic a-fib.  Luis Yu is aware that RA is unavailable until this Monday 8.27.18 and is okay with waiting until then.  Pt is scheduled to pick up his ASV on Thursday 8.30.18.  Dr Vassie Loll please advise, thank you.

## 2016-11-09 NOTE — Telephone Encounter (Signed)
Spoke with patient's wife and explained RA's recs. She verbalized understanding.

## 2016-11-09 NOTE — Telephone Encounter (Signed)
ASV is not contraindicated for atrial fibrillation. It is  only contraindicated for patients with heart failure and low EF -which he does not have

## 2016-11-20 ENCOUNTER — Other Ambulatory Visit: Payer: Self-pay | Admitting: Adult Health

## 2016-11-20 NOTE — Telephone Encounter (Signed)
Sent to the pharmacy by e-scribe for 6 months.  Last labs 07/2016

## 2016-11-22 ENCOUNTER — Ambulatory Visit (INDEPENDENT_AMBULATORY_CARE_PROVIDER_SITE_OTHER): Payer: Medicare Other | Admitting: Orthotics

## 2016-11-22 DIAGNOSIS — M79609 Pain in unspecified limb: Principal | ICD-10-CM

## 2016-11-22 DIAGNOSIS — E1140 Type 2 diabetes mellitus with diabetic neuropathy, unspecified: Secondary | ICD-10-CM

## 2016-11-22 DIAGNOSIS — B351 Tinea unguium: Secondary | ICD-10-CM

## 2016-11-22 DIAGNOSIS — S92421A Displaced fracture of distal phalanx of right great toe, initial encounter for closed fracture: Secondary | ICD-10-CM

## 2016-11-22 DIAGNOSIS — R2681 Unsteadiness on feet: Secondary | ICD-10-CM | POA: Diagnosis not present

## 2016-11-22 DIAGNOSIS — Z794 Long term (current) use of insulin: Secondary | ICD-10-CM

## 2016-11-22 DIAGNOSIS — M79673 Pain in unspecified foot: Secondary | ICD-10-CM | POA: Diagnosis not present

## 2016-11-22 NOTE — Progress Notes (Signed)

## 2016-11-30 ENCOUNTER — Ambulatory Visit (INDEPENDENT_AMBULATORY_CARE_PROVIDER_SITE_OTHER): Payer: Medicare Other | Admitting: Adult Health

## 2016-11-30 ENCOUNTER — Encounter: Payer: Self-pay | Admitting: Adult Health

## 2016-11-30 VITALS — BP 140/88 | HR 76 | Temp 98.1°F | Ht 74.0 in | Wt 226.0 lb

## 2016-11-30 DIAGNOSIS — B356 Tinea cruris: Secondary | ICD-10-CM | POA: Diagnosis not present

## 2016-11-30 DIAGNOSIS — L739 Follicular disorder, unspecified: Secondary | ICD-10-CM

## 2016-11-30 DIAGNOSIS — I639 Cerebral infarction, unspecified: Secondary | ICD-10-CM

## 2016-11-30 DIAGNOSIS — Z23 Encounter for immunization: Secondary | ICD-10-CM | POA: Diagnosis not present

## 2016-11-30 MED ORDER — NYSTATIN 100000 UNIT/GM EX CREA: 1.0000 "application " | TOPICAL_CREAM | Freq: Two times a day (BID) | CUTANEOUS | 0 refills | Status: DC

## 2016-11-30 MED ORDER — TRIAMCINOLONE ACETONIDE 0.1 % EX CREA: 1.0000 "application " | TOPICAL_CREAM | Freq: Two times a day (BID) | CUTANEOUS | 0 refills | Status: DC

## 2016-11-30 MED ORDER — DOXYCYCLINE HYCLATE 100 MG PO CAPS: 100.0000 mg | ORAL_CAPSULE | Freq: Two times a day (BID) | ORAL | 0 refills | Status: DC

## 2016-11-30 MED ORDER — NYSTATIN 100000 UNIT/GM EX CREA: 1.0000 "application " | TOPICAL_CREAM | Freq: Two times a day (BID) | CUTANEOUS | 3 refills | Status: DC

## 2016-11-30 NOTE — Addendum Note (Signed)
Addended by: Luella Cook E on: 11/30/2016 11:09 AM   Modules accepted: Orders

## 2016-11-30 NOTE — Addendum Note (Signed)
Addended by: Nancy Fetter on: 11/30/2016 11:10 AM   Modules accepted: Orders

## 2016-11-30 NOTE — Progress Notes (Signed)
Subjective:    Patient ID: Luis Yu, male    DOB: 11/06/1945, 71 y.o.   MRN: [id number]  HPI  71 year old male who  has a past medical history of Adjustment disorder with mixed emotional features; Atrial flutter (HCC); CAD (coronary artery disease); Depression; Diabetes mellitus type II, uncontrolled (HCC); Diabetic peripheral neuropathy (HCC); Erectile dysfunction; GERD (gastroesophageal reflux disease); Herpes zoster; Hyperlipidemia; Hypertension; Obesity; Osteoarthritis; Postherpetic neuralgia; and Sleep apnea.  He presents for an acute issue of " red rash" in his right axilla. He reports first noticing about 2 days ago. Reports mild discomfort and itching. He has not changed deodorants or soaps. No irritation on left axilla   Also complaining of " jock itch" in his groin. He has been using Lotrimin but has not noticed a lot of improvement   Review of Systems See HPI   Past Medical History:  Diagnosis Date  . Adjustment disorder with mixed emotional features   . Atrial flutter (HCC)   . CAD (coronary artery disease)   . Depression   . Diabetes mellitus type II, uncontrolled (HCC)   . Diabetic peripheral neuropathy (HCC)   . Erectile dysfunction   . GERD (gastroesophageal reflux disease)   . Herpes zoster   . Hyperlipidemia   . Hypertension   . Obesity   . Osteoarthritis   . Postherpetic neuralgia   . Sleep apnea     Social History   Social History  . Marital status: Married    Spouse name: [name]  . Number of children: 1  . Years of education: 40   Occupational History  . Retired Naval architect Retired   Social History Main Topics  . Smoking status: Never Smoker  . Smokeless tobacco: Never Used  . Alcohol use 1.2 oz/week    2 Standard drinks or equivalent per week     Comment: twice a month.  . Drug use: No  . Sexual activity: Yes    Partners: Female   Other Topics Concern  . Not on file   Social History Narrative   Married [name])   Regular  exercise: none   Caffeine use: cup of coffee daily    Retired Naval architect   Caffeine- twice daily             Past Surgical History:  Procedure Laterality Date  . CARDIOVERSION N/A 11/10/2015   Procedure: CARDIOVERSION;  Surgeon: Laurey Morale, MD;  Location: [facility] ENDOSCOPY;  Service: Cardiovascular;  Laterality: N/A;  . CARDIOVERSION N/A 02/21/2016   Procedure: CARDIOVERSION;  Surgeon: Lewayne Bunting, MD;  Location: [facility] ENDOSCOPY;  Service: Cardiovascular;  Laterality: N/A;  . CORONARY ARTERY BYPASS GRAFT  1996   eith a LIMA to the LAD, saphenous vein graft to the acute marginal, saphenous vein graft to the PDA and saphenous vein graft to the circumflex.  [name] KNEE ARTHROSCOPY  05/2004   right knee  . REPLACEMENT TOTAL KNEE BILATERAL    . SHOULDER SURGERY      Family History  Problem Relation Age of Onset  . Coronary artery disease Mother        deceased at 49  . Coronary artery disease Father        died age 60  . Stroke Unknown        half brother  . Stroke Brother     No Known Allergies  Current Outpatient Prescriptions on File Prior to Visit  Medication Sig Dispense Refill  . acetaminophen (TYLENOL) 500  MG tablet Take 2 tablets (1,000 mg total) by mouth every 6 (six) hours as needed. (Patient taking differently: Take 1,000 mg by mouth every 6 (six) hours as needed for mild pain. ) 30 tablet 0  . apixaban (ELIQUIS) 5 MG TABS tablet Take 1 tablet (5 mg total) by mouth 2 (two) times daily. 180 tablet 3  . aspirin EC 81 MG tablet Take 1 tablet (81 mg total) by mouth daily.    [name] atorvastatin (LIPITOR) 80 MG tablet Take 1 tablet (80 mg total) by mouth daily. 90 tablet 3  . buPROPion (WELLBUTRIN SR) 150 MG 12 hr tablet Take 1 tablet (150 mg total) by mouth 2 (two) times daily. 180 tablet 1  . glipiZIDE (GLUCOTROL) 10 MG tablet Take 10 mg by mouth daily.    [name] glucose blood (ACCU-CHEK ACTIVE STRIPS) test strip Use as instructed 100 each 12  . Insulin Glargine (BASAGLAR KWIKPEN)  100 UNIT/ML SOPN Inject 0.25 mLs (25 Units total) into the skin at bedtime. (Patient taking differently: Inject 20 Units into the skin at bedtime. ) 3 pen 11  . Insulin Pen Needle (PEN NEEDLES 31GX5/16") 31G X 8 MM MISC Use to inject insulin 5 times a day. 100 each 11  . isosorbide mononitrate (IMDUR) 30 MG 24 hr tablet Take 1 tablet (30 mg total) by mouth daily. 90 tablet 3  . Lancets (ACCU-CHEK MULTICLIX) lancets Use up to 5 times a day to check blood sugar. 100 each 5  . losartan-hydrochlorothiazide (HYZAAR) 50-12.5 MG tablet TAKE 1 TABLET BY MOUTH EVERY DAY 90 tablet 1  . metFORMIN (GLUCOPHAGE) 500 MG tablet Take 1 tablet (500 mg total) by mouth 2 (two) times daily with a meal. 60 tablet 1  . mirtazapine (REMERON) 30 MG tablet Take 1 tablet (30 mg total) by mouth at bedtime. 30 tablet 3  . nebivolol 20 MG TABS Take 1 tablet (20 mg total) by mouth daily. 30 tablet 1  . omeprazole (PRILOSEC) 20 MG capsule TAKE ONE CAPSULE TWICE A DAY BEFORE A MEAL 180 capsule 0  . thiamine 100 MG tablet Take 1 tablet (100 mg total) by mouth daily. 90 tablet 3  . vitamin B-12 (CYANOCOBALAMIN) 1000 MCG tablet Take 2 tablets (2,000 mcg total) by mouth daily. 180 tablet 3   No current facility-administered medications on file prior to visit.     BP 140/88   Pulse 76   Temp 98.1 F (36.7 C)   Ht  (1.88 m)   Wt 226 lb (102.5 kg)   SpO2 98%   BMI 29.02 kg/m       Objective:   Physical Exam  Constitutional: He is oriented to person, place, and time. He appears well-developed and well-nourished. No distress.  Cardiovascular: An irregularly irregular rhythm present. Exam reveals no gallop and no friction rub.   Pulmonary/Chest: Effort normal and breath sounds normal. No respiratory distress. He has no wheezes. He has no rales. He exhibits no tenderness.  Neurological: He is alert and oriented to person, place, and time.  Skin: Skin is warm and dry. No rash noted. He is not diaphoretic. No erythema. No  pallor.  Right Axilla: red papular rash noted. No pustules or drainage noted.   Groin: Tinea Cruris infection in bilateral groin  Psychiatric: He has a normal mood and affect. His behavior is normal. Judgment and thought content normal.  Vitals reviewed.     Assessment & Plan:  1. Folliculitis - Exam consistent with folliculitis.  - doxycycline (  VIBRAMYCIN) 100 MG capsule; Take 1 capsule (100 mg total) by mouth 2 (two) times daily.  Dispense: 14 capsule; Refill: 0 - triamcinolone cream (KENALOG) 0.1 %; Apply 1 application topically 2 (two) times daily.  Dispense: 30 g; Refill: 0 - Follow up if no resolution after antibiotic therapy or sooner if needed 2. Tinea cruris - nystatin cream (MYCOSTATIN); Apply 1 application topically 2 (two) times daily.  Dispense: 30 g; Refill: 0 - Add baby powder - Follow up as needed  Shirline Frees, NP

## 2016-12-07 ENCOUNTER — Telehealth: Payer: Self-pay | Admitting: Cardiology

## 2016-12-07 NOTE — Telephone Encounter (Signed)
New message   Patient calling the office for samples of medication:   1.  What medication and dosage are you requesting samples for?apixaban (ELIQUIS) 5 MG TABS tablet  2.  Are you currently out of this medication? Almost

## 2016-12-08 ENCOUNTER — Other Ambulatory Visit: Payer: Self-pay | Admitting: Adult Health

## 2016-12-08 DIAGNOSIS — F331 Major depressive disorder, recurrent, moderate: Secondary | ICD-10-CM

## 2016-12-08 DIAGNOSIS — F5101 Primary insomnia: Secondary | ICD-10-CM

## 2016-12-11 ENCOUNTER — Other Ambulatory Visit: Payer: Self-pay | Admitting: Adult Health

## 2016-12-11 DIAGNOSIS — IMO0002 Reserved for concepts with insufficient information to code with codable children: Secondary | ICD-10-CM

## 2016-12-11 DIAGNOSIS — E1165 Type 2 diabetes mellitus with hyperglycemia: Principal | ICD-10-CM

## 2016-12-11 DIAGNOSIS — E1140 Type 2 diabetes mellitus with diabetic neuropathy, unspecified: Secondary | ICD-10-CM

## 2016-12-11 DIAGNOSIS — Z760 Encounter for issue of repeat prescription: Secondary | ICD-10-CM

## 2016-12-11 DIAGNOSIS — Z794 Long term (current) use of insulin: Principal | ICD-10-CM

## 2016-12-12 NOTE — Telephone Encounter (Signed)
Sent to the pharmacy by e-scribe. 

## 2016-12-12 NOTE — Telephone Encounter (Signed)
Sent to the pharmacy by e-scribe for 6 months.  Pt has upcoming appt on 12/26/16

## 2016-12-14 ENCOUNTER — Encounter: Payer: Self-pay | Admitting: Pulmonary Disease

## 2016-12-18 ENCOUNTER — Other Ambulatory Visit: Payer: Self-pay | Admitting: Adult Health

## 2016-12-18 DIAGNOSIS — F331 Major depressive disorder, recurrent, moderate: Secondary | ICD-10-CM

## 2016-12-18 DIAGNOSIS — F5101 Primary insomnia: Secondary | ICD-10-CM

## 2016-12-19 NOTE — Telephone Encounter (Signed)
Called to the pharmacy.

## 2016-12-21 ENCOUNTER — Telehealth: Payer: Self-pay | Admitting: Pulmonary Disease

## 2016-12-21 DIAGNOSIS — G4733 Obstructive sleep apnea (adult) (pediatric): Secondary | ICD-10-CM

## 2016-12-21 NOTE — Telephone Encounter (Signed)
Quite a few residual events on ASV, EPAP 7 Predominant hypopneas, AHI 13/hour  Please increase EPAP to 8 cm He needs office visit with download in one month with TP/ me

## 2016-12-25 NOTE — Telephone Encounter (Signed)
Spoke with patient's wife [name]. She verbalized understanding. Nothing else needed at time of call.

## 2016-12-26 ENCOUNTER — Encounter: Payer: Self-pay | Admitting: Adult Health

## 2016-12-26 ENCOUNTER — Ambulatory Visit (INDEPENDENT_AMBULATORY_CARE_PROVIDER_SITE_OTHER): Payer: Medicare Other | Admitting: Adult Health

## 2016-12-26 VITALS — BP 134/76 | Temp 97.8°F | Ht 74.0 in | Wt 224.0 lb

## 2016-12-26 DIAGNOSIS — S81801A Unspecified open wound, right lower leg, initial encounter: Secondary | ICD-10-CM

## 2016-12-26 DIAGNOSIS — I639 Cerebral infarction, unspecified: Secondary | ICD-10-CM | POA: Diagnosis not present

## 2016-12-26 LAB — HEMOGLOBIN A1C: Hemoglobin A1C: 6.2

## 2016-12-26 MED ORDER — DOXYCYCLINE HYCLATE 100 MG PO CAPS: 100.0000 mg | ORAL_CAPSULE | Freq: Two times a day (BID) | ORAL | 0 refills | Status: DC

## 2016-12-26 NOTE — Addendum Note (Signed)
Addended by: Nancy Fetter on: 12/26/2016 02:47 PM   Modules accepted: Level of Service

## 2016-12-26 NOTE — Telephone Encounter (Signed)
New message    Pt wife is calling.    Patient calling the office for samples of medication:   1.  What medication and dosage are you requesting samples for? Eliquis 5 mg  2.  Are you currently out of this medication? Yes.

## 2016-12-26 NOTE — Addendum Note (Signed)
Addended by: Nancy Fetter on: 12/26/2016 02:49 PM   Modules accepted: Level of Service

## 2016-12-26 NOTE — Progress Notes (Signed)
Subjective:    Patient ID: Luis Yu, male    DOB: [birth year], 71 y.o.   MRN: [id number]  HPI  71 year old male who  has a past medical history of Adjustment disorder with mixed emotional features; Atrial flutter (HCC); CAD (coronary artery disease); Depression; Diabetes mellitus type II, uncontrolled (HCC); Diabetic peripheral neuropathy (HCC); Erectile dysfunction; GERD (gastroesophageal reflux disease); Herpes zoster; Hyperlipidemia; Hypertension; Obesity; Osteoarthritis; Postherpetic neuralgia; and Sleep apnea.   He presents to the office today for an acute issue wound to right shin. He reports that he stumbled and hit his right shin on a bathroom fixture. His wife has been applying neosporin. Despite this he still continues to have redness and pain. She reports that there has been no drainage.   Review of Systems See HPI  Past Medical History:  Diagnosis Date  . Adjustment disorder with mixed emotional features   . Atrial flutter (HCC)   . CAD (coronary artery disease)   . Depression   . Diabetes mellitus type II, uncontrolled (HCC)   . Diabetic peripheral neuropathy (HCC)   . Erectile dysfunction   . GERD (gastroesophageal reflux disease)   . Herpes zoster   . Hyperlipidemia   . Hypertension   . Obesity   . Osteoarthritis   . Postherpetic neuralgia   . Sleep apnea     Social History   Social History  . Marital status: Married    Spouse name: [name]  . Number of children: 1  . Years of education: 21   Occupational History  . Retired Naval architect Retired   Social History Main Topics  . Smoking status: Never Smoker  . Smokeless tobacco: Never Used  . Alcohol use 1.2 oz/week    2 Standard drinks or equivalent per week     Comment: twice a month.  . Drug use: No  . Sexual activity: Yes    Partners: Female   Other Topics Concern  . Not on file   Social History Narrative   Married [name])   Regular exercise: none   Caffeine use: cup of coffee daily    Retired Naval architect   Caffeine- twice daily             Past Surgical History:  Procedure Laterality Date  . CARDIOVERSION N/A 11/10/2015   Procedure: CARDIOVERSION;  Surgeon: Laurey Morale, MD;  Location: [facility] ENDOSCOPY;  Service: Cardiovascular;  Laterality: N/A;  . CARDIOVERSION N/A 02/21/2016   Procedure: CARDIOVERSION;  Surgeon: Lewayne Bunting, MD;  Location: [facility] ENDOSCOPY;  Service: Cardiovascular;  Laterality: N/A;  . CORONARY ARTERY BYPASS GRAFT  1996   eith a LIMA to the LAD, saphenous vein graft to the acute marginal, saphenous vein graft to the PDA and saphenous vein graft to the circumflex.  [name] KNEE ARTHROSCOPY  05/2004   right knee  . REPLACEMENT TOTAL KNEE BILATERAL    . SHOULDER SURGERY      Family History  Problem Relation Age of Onset  . Coronary artery disease Mother        deceased at 46  . Coronary artery disease Father        died age 43  . Stroke Unknown        half brother  . Stroke Brother     No Known Allergies  Current Outpatient Prescriptions on File Prior to Visit  Medication Sig Dispense Refill  . acetaminophen (TYLENOL) 500 MG tablet Take 2 tablets (1,000 mg total) by mouth every 6 (  six) hours as needed. (Patient taking differently: Take 1,000 mg by mouth every 6 (six) hours as needed for mild pain. ) 30 tablet 0  . apixaban (ELIQUIS) 5 MG TABS tablet Take 1 tablet (5 mg total) by mouth 2 (two) times daily. 180 tablet 3  . aspirin EC 81 MG tablet Take 1 tablet (81 mg total) by mouth daily.    [name] atorvastatin (LIPITOR) 80 MG tablet Take 1 tablet (80 mg total) by mouth daily. 90 tablet 3  . buPROPion (WELLBUTRIN SR) 150 MG 12 hr tablet Take 1 tablet (150 mg total) by mouth 2 (two) times daily. 180 tablet 1  . glipiZIDE (GLUCOTROL) 10 MG tablet Take 10 mg by mouth daily.    [name] glucose blood (ACCU-CHEK ACTIVE STRIPS) test strip Use as instructed 100 each 12  . Insulin Glargine (BASAGLAR KWIKPEN) 100 UNIT/ML SOPN Inject 0.25 mLs (25 Units total) into  the skin at bedtime. (Patient taking differently: Inject 20 Units into the skin at bedtime. ) 3 pen 11  . Insulin Pen Needle (PEN NEEDLES 31GX5/16") 31G X 8 MM MISC Use to inject insulin 5 times a day. 100 each 11  . isosorbide mononitrate (IMDUR) 30 MG 24 hr tablet Take 1 tablet (30 mg total) by mouth daily. 90 tablet 3  . Lancets (ACCU-CHEK MULTICLIX) lancets Use up to 5 times a day to check blood sugar. 100 each 5  . losartan-hydrochlorothiazide (HYZAAR) 50-12.5 MG tablet TAKE 1 TABLET BY MOUTH EVERY DAY 90 tablet 1  . metFORMIN (GLUCOPHAGE) 1000 MG tablet TAKE 1 TABLET (1,000 MG TOTAL) BY MOUTH 2 (TWO) TIMES DAILY WITH A MEAL. 180 tablet 1  . mirtazapine (REMERON) 30 MG tablet TAKE 1 TABLET (30 MG TOTAL) BY MOUTH AT BEDTIME. 90 tablet 1  . nebivolol 20 MG TABS Take 1 tablet (20 mg total) by mouth daily. 30 tablet 1  . omeprazole (PRILOSEC) 20 MG capsule TAKE ONE CAPSULE TWICE A DAY BEFORE A MEAL 180 capsule 0  . thiamine 100 MG tablet Take 1 tablet (100 mg total) by mouth daily. 90 tablet 3  . vitamin B-12 (CYANOCOBALAMIN) 1000 MCG tablet Take 2 tablets (2,000 mcg total) by mouth daily. 180 tablet 3   No current facility-administered medications on file prior to visit.     BP 134/76 (BP Location: Left Arm)   Temp 97.8 F (36.6 C) (Oral)   Ht  (1.88 m)   Wt 224 lb (101.6 kg)   BMI 28.76 kg/m       Objective:   Physical Exam  Constitutional: He is oriented to person, place, and time. He appears well-developed and well-nourished. No distress.  Musculoskeletal: Normal range of motion. He exhibits no edema, tenderness or deformity.  Neurological: He is alert and oriented to person, place, and time.  Skin: Skin is warm and dry. No rash noted. He is not diaphoretic. No erythema. No pallor.  Non healing wound to right shin. Localized erythremia with eschar tissue noted   Psychiatric: He has a normal mood and affect. His behavior is normal. Judgment and thought content normal.    Nursing note and vitals reviewed.     Assessment & Plan:  1. Non-healing wound of lower extremity, right, initial encounter - Will prescribe doxycyline.  - doxycycline (VIBRAMYCIN) 100 MG capsule; Take 1 capsule (100 mg total) by mouth 2 (two) times daily.  Dispense: 14 capsule; Refill: 0 - Follow up if no resolution after abx therapy   Shirline Frees, NP

## 2016-12-28 NOTE — Telephone Encounter (Signed)
Returned the call to the patient. There wasn't any answer and was unable to leave a message.  We are currently out of stock of Eliquis samples.

## 2016-12-29 ENCOUNTER — Encounter (HOSPITAL_COMMUNITY): Payer: Self-pay | Admitting: *Deleted

## 2016-12-29 DIAGNOSIS — Z7982 Long term (current) use of aspirin: Secondary | ICD-10-CM | POA: Diagnosis not present

## 2016-12-29 DIAGNOSIS — Z8673 Personal history of transient ischemic attack (TIA), and cerebral infarction without residual deficits: Secondary | ICD-10-CM | POA: Diagnosis not present

## 2016-12-29 DIAGNOSIS — I10 Essential (primary) hypertension: Secondary | ICD-10-CM | POA: Insufficient documentation

## 2016-12-29 DIAGNOSIS — I4892 Unspecified atrial flutter: Secondary | ICD-10-CM | POA: Insufficient documentation

## 2016-12-29 DIAGNOSIS — R109 Unspecified abdominal pain: Secondary | ICD-10-CM | POA: Insufficient documentation

## 2016-12-29 DIAGNOSIS — M545 Low back pain: Secondary | ICD-10-CM | POA: Insufficient documentation

## 2016-12-29 DIAGNOSIS — Z79899 Other long term (current) drug therapy: Secondary | ICD-10-CM | POA: Diagnosis not present

## 2016-12-29 DIAGNOSIS — I2510 Atherosclerotic heart disease of native coronary artery without angina pectoris: Secondary | ICD-10-CM | POA: Diagnosis not present

## 2016-12-29 DIAGNOSIS — Z794 Long term (current) use of insulin: Secondary | ICD-10-CM | POA: Diagnosis not present

## 2016-12-29 DIAGNOSIS — E785 Hyperlipidemia, unspecified: Secondary | ICD-10-CM | POA: Insufficient documentation

## 2016-12-29 DIAGNOSIS — E1140 Type 2 diabetes mellitus with diabetic neuropathy, unspecified: Secondary | ICD-10-CM | POA: Diagnosis not present

## 2016-12-29 LAB — COMPREHENSIVE METABOLIC PANEL
ALT: 18 U/L (ref 17–63)
AST: 24 U/L (ref 15–41)
Albumin: 3.9 g/dL (ref 3.5–5.0)
Alkaline Phosphatase: 94 U/L (ref 38–126)
Anion gap: 10 (ref 5–15)
BUN: 18 mg/dL (ref 6–20)
CO2: 27 mmol/L (ref 22–32)
Calcium: 9.3 mg/dL (ref 8.9–10.3)
Chloride: 104 mmol/L (ref 101–111)
Creatinine, Ser: 1.23 mg/dL (ref 0.61–1.24)
GFR calc Af Amer: >60 mL/min (ref >60)
GFR calc non Af Amer: 57 mL/min — ABNORMAL LOW (ref >60)
Glucose, Bld: 108 mg/dL — ABNORMAL HIGH (ref 65–99)
Potassium: 4.4 mmol/L (ref 3.5–5.1)
Sodium: 141 mmol/L (ref 135–145)
Total Bilirubin: 1.1 mg/dL (ref 0.3–1.2)
Total Protein: 6.7 g/dL (ref 6.5–8.1)

## 2016-12-29 LAB — CBC
HCT: 41.3 % (ref 39.0–52.0)
Hemoglobin: 12.7 g/dL — ABNORMAL LOW (ref 13.0–17.0)
MCH: 29.1 pg (ref 26.0–34.0)
MCHC: 30.8 g/dL (ref 30.0–36.0)
MCV: 94.7 fL (ref 78.0–100.0)
Platelets: 133 10*3/uL — ABNORMAL LOW (ref 150–400)
RBC: 4.36 MIL/uL (ref 4.22–5.81)
RDW: 16.3 % — ABNORMAL HIGH (ref 11.5–15.5)
WBC: 8.2 10*3/uL (ref 4.0–10.5)

## 2016-12-29 LAB — LIPASE, BLOOD: Lipase: 28 U/L (ref 11–51)

## 2016-12-29 LAB — CBG MONITORING, ED: Glucose-Capillary: 106 mg/dL — ABNORMAL HIGH (ref 65–99)

## 2016-12-29 NOTE — ED Triage Notes (Signed)
The pt has had rt flank pain for 2 days and he has had urinary frequency  No previous history

## 2016-12-30 ENCOUNTER — Emergency Department (HOSPITAL_COMMUNITY): Payer: Medicare Other

## 2016-12-30 ENCOUNTER — Encounter (HOSPITAL_COMMUNITY): Payer: Self-pay | Admitting: Radiology

## 2016-12-30 ENCOUNTER — Emergency Department (HOSPITAL_COMMUNITY)
Admission: EM | Admit: 2016-12-30 | Discharge: 2016-12-30 | Disposition: A | Discharge status: Home / Self Care | Payer: Medicare Other | Attending: Emergency Medicine | Admitting: Emergency Medicine

## 2016-12-30 DIAGNOSIS — M545 Low back pain, unspecified: Secondary | ICD-10-CM

## 2016-12-30 DIAGNOSIS — R109 Unspecified abdominal pain: Secondary | ICD-10-CM | POA: Diagnosis not present

## 2016-12-30 LAB — URINALYSIS, ROUTINE W REFLEX MICROSCOPIC
Bacteria, UA: NONE SEEN
Bilirubin Urine: NEGATIVE
Glucose, UA: NEGATIVE mg/dL
Hgb urine dipstick: NEGATIVE
Ketones, ur: NEGATIVE mg/dL
Leukocytes, UA: NEGATIVE
Nitrite: NEGATIVE
Protein, ur: 30 mg/dL — AB
Specific Gravity, Urine: 1.021 (ref 1.005–1.030)
Squamous Epithelial / LPF: NONE SEEN
pH: 5 (ref 5.0–8.0)

## 2016-12-30 LAB — CBG MONITORING, ED: Glucose-Capillary: 91 mg/dL (ref 65–99)

## 2016-12-30 MED ORDER — OXYCODONE-ACETAMINOPHEN 5-325 MG PO TABS: 1.0000 | ORAL_TABLET | Freq: Four times a day (QID) | ORAL | 0 refills | Status: DC | PRN

## 2016-12-30 MED ORDER — ONDANSETRON 4 MG PO TBDP: 4.0000 mg | ORAL_TABLET | Freq: Three times a day (TID) | ORAL | 0 refills | Status: DC | PRN

## 2016-12-30 MED ORDER — FENTANYL CITRATE (PF) 100 MCG/2ML IJ SOLN
50.0000 ug | Freq: Once | INTRAMUSCULAR | Status: AC
  Administered 2016-12-30: 50 ug via INTRAVENOUS
  Filled 2016-12-30: qty 2

## 2016-12-30 MED ORDER — SODIUM CHLORIDE 0.9 % IV BOLUS (SEPSIS)
500.0000 mL | Freq: Once | INTRAVENOUS | Status: AC
  Administered 2016-12-30: 500 mL via INTRAVENOUS

## 2016-12-30 MED ORDER — MORPHINE SULFATE (PF) 4 MG/ML IV SOLN
4.0000 mg | Freq: Once | INTRAVENOUS | Status: AC
  Administered 2016-12-30: 4 mg via INTRAVENOUS
  Filled 2016-12-30: qty 1

## 2016-12-30 MED ORDER — ONDANSETRON HCL 4 MG/2ML IJ SOLN
4.0000 mg | Freq: Once | INTRAMUSCULAR | Status: AC
  Administered 2016-12-30: 4 mg via INTRAVENOUS
  Filled 2016-12-30: qty 2

## 2016-12-30 NOTE — ED Notes (Signed)
Ambulated pt. In room, strong desire to not walk in hallway.  Took 20 min's of talking about the act of walking, etc, seemed unsure if he could walk with 1 assist and walker.  He sat at end of bed for 20 sec before starting ambulation, then walked to bathroom, sat on toilet for 2 min's, no voiding, helped him back up, walked back to bed, sat again for 1 min.  Repeated this 2 more times, but with each round, standing up faster and faster, with less assistance from me on 3rd round. Walked without assistance from me from bathroom to bed on last trip.  Progressively got better and better each round, started shaky, then got better, then felt normal.  Wife noted that this is how he normally ambulates, just has some 8/10 right flank pain.

## 2016-12-30 NOTE — ED Notes (Signed)
Patient transported to CT 

## 2016-12-30 NOTE — ED Provider Notes (Signed)
TIME SEEN: 1:45 AM  CHIEF COMPLAINT: Right lower back pain  HPI: Patient is a 71 year old male with history of CAD, hypertension, hyperlipidemia, diabetes, atrial flutter, CVA on Eliquis who presents to the emergency department with complaints of right lower back pain for the past 2-3 days. No injury to his back. He has had no increased physical exertion that he is aware of. Pain worse with palpation and movement. He has chronic right leg weakness secondary to his previous stroke. Wife reports he is supposed to use a walker at all times but he does not. No new falls. No numbness, tingling or focal weakness. He does have chronic diabetic peripheral neuropathy which is unchanged. No bowel or bladder incontinence. He has had urinary frequency and feels like he is "dribbling" urine. Has history of prostate issues. This is not a new problem for him. No dysuria or hematuria. No history of kidney stones.  ROS: See HPI Constitutional: no fever  Eyes: no drainage  ENT: no runny nose   Cardiovascular:  no chest pain  Resp: no SOB  GI: no vomiting GU: no dysuria Integumentary: no rash  Allergy: no hives  Musculoskeletal: no leg swelling  Neurological: no slurred speech ROS otherwise negative  PAST MEDICAL HISTORY/PAST SURGICAL HISTORY:  Past Medical History:  Diagnosis Date  . Adjustment disorder with mixed emotional features   . Atrial flutter (HCC)   . CAD (coronary artery disease)   . Depression   . Diabetes mellitus type II, uncontrolled (HCC)   . Diabetic peripheral neuropathy (HCC)   . Erectile dysfunction   . GERD (gastroesophageal reflux disease)   . Herpes zoster   . Hyperlipidemia   . Hypertension   . Obesity   . Osteoarthritis   . Postherpetic neuralgia   . Sleep apnea     MEDICATIONS:  Prior to Admission medications   Medication Sig Start Date End Date Taking? Authorizing Provider  acetaminophen (TYLENOL) 500 MG tablet Take 2 tablets (1,000 mg total) by mouth every 6 (six)  hours as needed. Patient taking differently: Take 1,000 mg by mouth every 6 (six) hours as needed for mild pain.  05/24/16   Arby Barrette, MD  apixaban (ELIQUIS) 5 MG TABS tablet Take 1 tablet (5 mg total) by mouth 2 (two) times daily. 08/09/16   Shirline Frees, NP  aspirin EC 81 MG tablet Take 1 tablet (81 mg total) by mouth daily. 10/14/15   Robbie Lis M, PA-C  atorvastatin (LIPITOR) 80 MG tablet Take 1 tablet (80 mg total) by mouth daily. 07/06/16   Angiulli, Mcarthur Rossetti, PA-C  buPROPion (WELLBUTRIN SR) 150 MG 12 hr tablet Take 1 tablet (150 mg total) by mouth 2 (two) times daily. 07/06/16   Angiulli, Mcarthur Rossetti, PA-C  doxycycline (VIBRAMYCIN) 100 MG capsule Take 1 capsule (100 mg total) by mouth 2 (two) times daily. 12/26/16   Nafziger, Kandee Keen, NP  glipiZIDE (GLUCOTROL) 10 MG tablet Take 10 mg by mouth daily. 07/14/16   [provider]  glucose blood (ACCU-CHEK ACTIVE STRIPS) test strip Use as instructed 03/29/16   Nafziger, Kandee Keen, NP  Insulin Glargine (BASAGLAR KWIKPEN) 100 UNIT/ML SOPN Inject 0.25 mLs (25 Units total) into the skin at bedtime. Patient taking differently: Inject 20 Units into the skin at bedtime.  07/06/16   Angiulli, Mcarthur Rossetti, PA-C  Insulin Pen Needle (PEN NEEDLES 31GX5/16") 31G X 8 MM MISC Use to inject insulin 5 times a day. 07/06/16   Angiulli, Mcarthur Rossetti, PA-C  isosorbide mononitrate (IMDUR) 30 MG  24 hr tablet Take 1 tablet (30 mg total) by mouth daily. 08/09/16   Nafziger, Kandee Keen, NP  Lancets (ACCU-CHEK MULTICLIX) lancets Use up to 5 times a day to check blood sugar. 07/06/16   Angiulli, Mcarthur Rossetti, PA-C  losartan-hydrochlorothiazide (HYZAAR) 50-12.5 MG tablet TAKE 1 TABLET BY MOUTH EVERY DAY 11/20/16   Nafziger, Kandee Keen, NP  metFORMIN (GLUCOPHAGE) 1000 MG tablet TAKE 1 TABLET (1,000 MG TOTAL) BY MOUTH 2 (TWO) TIMES DAILY WITH A MEAL. 12/12/16   Nafziger, Kandee Keen, NP  mirtazapine (REMERON) 30 MG tablet TAKE 1 TABLET (30 MG TOTAL) BY MOUTH AT BEDTIME. 12/19/16   Nafziger, Kandee Keen, NP   nebivolol 20 MG TABS Take 1 tablet (20 mg total) by mouth daily. 07/06/16   Angiulli, Mcarthur Rossetti, PA-C  omeprazole (PRILOSEC) 20 MG capsule TAKE ONE CAPSULE TWICE A DAY BEFORE A MEAL 10/05/16   Nafziger, Kandee Keen, NP  thiamine 100 MG tablet Take 1 tablet (100 mg total) by mouth daily. 08/09/16   Nafziger, Kandee Keen, NP  vitamin B-12 (CYANOCOBALAMIN) 1000 MCG tablet Take 2 tablets (2,000 mcg total) by mouth daily. 08/09/16   Nafziger, Kandee Keen, NP    ALLERGIES:  No Known Allergies  SOCIAL HISTORY:  Social History  Substance Use Topics  . Smoking status: Never Smoker  . Smokeless tobacco: Never Used  . Alcohol use 1.2 oz/week    2 Standard drinks or equivalent per week     Comment: twice a month.    FAMILY HISTORY: Family History  Problem Relation Age of Onset  . Coronary artery disease Mother        deceased at 64  . Coronary artery disease Father        died age 67  . Stroke Unknown        half brother  . Stroke Brother     EXAM: BP (!) 165/100 (BP Location: Right Arm)   Pulse 66   Temp 97.9 F (36.6 C) (Oral)   Resp 16   Ht  (1.88 m)   Wt 101.6 kg (224 lb)   SpO2 97%   BMI 28.76 kg/m  CONSTITUTIONAL: Alert and oriented and responds appropriately to questions. Well-appearing; well-nourished, elderly, appears uncomfortable when sitting upright in the bed but otherwise in no apparent distress HEAD: Normocephalic EYES: Conjunctivae clear, pupils appear equal, EOMI ENT: normal nose; moist mucous membranes NECK: Supple, no meningismus, no nuchal rigidity, no LAD  CARD: RRR; S1 and S2 appreciated; no murmurs, no clicks, no rubs, no gallops RESP: Normal chest excursion without splinting or tachypnea; breath sounds clear and equal bilaterally; no wheezes, no rhonchi, no rales, no hypoxia or respiratory distress, speaking full sentences ABD/GI: Normal bowel sounds; non-distended; soft, non-tender, no rebound, no guarding, no peritoneal signs, no hepatosplenomegaly BACK:  The back appears  normal and is tender over the right lumbar paraspinal muscles without erythema, warmth, swelling or ecchymosis, there is no CVA tenderness, no midline spinal tenderness or step-off or deformity EXT: Normal ROM in all joints; non-tender to palpation; no edema; normal capillary refill; no cyanosis, no calf tenderness or swelling    SKIN: Normal color for age and race; warm; no rash NEURO: Moves all extremities equally, strength in the right leg is 4/5 but 5+/5 in all 3 other extremities, normal sensation diffusely, cranial nerves II through XII intact, normal speech PSYCH: The patient's mood and manner are appropriate. Grooming and personal hygiene are appropriate.  MEDICAL DECISION MAKING: Patient here with back pain. Seems to be musculoskeletal in nature but  given some urinary symptoms although not new, will obtain CT to evaluate for pyelonephritis, kidney stone. Labs obtained in triage are unremarkable. No leukocytosis. Normal LFTs and lipase. Abdominal exam is benign. Urine shows no sign of infection or blood. He has no midline spinal tenderness on exam or focal neurologic deficit that is new. Has chronic right leg weakness from previous stroke and is supposed to be using a walker at all times. Doubt cauda equina, spinal stenosis, discitis, osteomyelitis, epidural abscess or hematoma. I do not feel he needs emergent MRI at this time. We'll give IV fluids (as wife reports she feels he is dehydrated because his urine is dark), pain and nausea medicine.  ED PROGRESS: Patient's CT scan shows no acute abnormality. He has severe arthritis in the lumbar spine and right hip. There is extensive aortic atherosclerosis. I do not think this is a dissection or aneurysm. Appendix is normal. Patient reports pain is improving. We will get him up and ambulate him with a walker. If he does well, he will be discharged home with his wife with close PCP follow-up in a prescription of Percocet for pain control. They're  comfortable with this plan.  4:00 AM  Pt able to ambulate with his walker. I have instructed him that he needs to use his walker at all times. We'll discharge with short course of Percocet. Strongly recommended close PCP follow-up. They are comfortable with this plan.   At this time, I do not feel there is any life-threatening condition present. I have reviewed and discussed all results (EKG, imaging, lab, urine as appropriate) and exam findings with patient/family. I have reviewed nursing notes and appropriate previous records.  I feel the patient is safe to be discharged home without further emergent workup and can continue workup as an outpatient as needed. Discussed usual and customary return precautions. Patient/family verbalize understanding and are comfortable with this plan.  Outpatient follow-up has been provided if needed. All questions have been answered.    Neel Buffone, Layla Maw, DO 12/30/16 [phone]

## 2016-12-30 NOTE — Discharge Instructions (Signed)
I recommend over-the-counter stool softeners twice daily while on narcotic pain medication. Please follow-up closely with your primary care doctor for further evaluation, pain management. Labs, urine and CT scan today were normal other than demonstrating severe arthritis of the lower back and right hip which is likely the cause of your pain today.

## 2016-12-31 DIAGNOSIS — M4723 Other spondylosis with radiculopathy, cervicothoracic region: Secondary | ICD-10-CM | POA: Diagnosis not present

## 2016-12-31 DIAGNOSIS — M9904 Segmental and somatic dysfunction of sacral region: Secondary | ICD-10-CM | POA: Diagnosis not present

## 2016-12-31 DIAGNOSIS — M9903 Segmental and somatic dysfunction of lumbar region: Secondary | ICD-10-CM | POA: Diagnosis not present

## 2016-12-31 DIAGNOSIS — M9901 Segmental and somatic dysfunction of cervical region: Secondary | ICD-10-CM | POA: Diagnosis not present

## 2017-01-01 DIAGNOSIS — M9901 Segmental and somatic dysfunction of cervical region: Secondary | ICD-10-CM | POA: Diagnosis not present

## 2017-01-01 DIAGNOSIS — M9903 Segmental and somatic dysfunction of lumbar region: Secondary | ICD-10-CM | POA: Diagnosis not present

## 2017-01-01 DIAGNOSIS — M9904 Segmental and somatic dysfunction of sacral region: Secondary | ICD-10-CM | POA: Diagnosis not present

## 2017-01-01 DIAGNOSIS — M4723 Other spondylosis with radiculopathy, cervicothoracic region: Secondary | ICD-10-CM | POA: Diagnosis not present

## 2017-01-01 NOTE — Telephone Encounter (Signed)
Attempted to call patient on work # and home # listed. No answer on either #. No samples available at this time. Encounter closed.

## 2017-01-01 NOTE — Telephone Encounter (Signed)
Patient's wife returned call. She states he has 1 week of eliquis left. Advised no samples of eliquis are available for pick up.   Offered bystolic samples to help offset prescription drug cost 6 bottles provided Lot: [id number]  Exp: 12/20

## 2017-01-02 DIAGNOSIS — M4723 Other spondylosis with radiculopathy, cervicothoracic region: Secondary | ICD-10-CM | POA: Diagnosis not present

## 2017-01-02 DIAGNOSIS — M9901 Segmental and somatic dysfunction of cervical region: Secondary | ICD-10-CM | POA: Diagnosis not present

## 2017-01-02 DIAGNOSIS — M9904 Segmental and somatic dysfunction of sacral region: Secondary | ICD-10-CM | POA: Diagnosis not present

## 2017-01-02 DIAGNOSIS — M9903 Segmental and somatic dysfunction of lumbar region: Secondary | ICD-10-CM | POA: Diagnosis not present

## 2017-01-04 ENCOUNTER — Ambulatory Visit (INDEPENDENT_AMBULATORY_CARE_PROVIDER_SITE_OTHER): Payer: Medicare Other | Admitting: Adult Health

## 2017-01-04 VITALS — BP 140/82 | Temp 97.5°F | Ht 74.0 in | Wt 224.0 lb

## 2017-01-04 DIAGNOSIS — M545 Low back pain, unspecified: Secondary | ICD-10-CM

## 2017-01-04 DIAGNOSIS — N401 Enlarged prostate with lower urinary tract symptoms: Secondary | ICD-10-CM | POA: Diagnosis not present

## 2017-01-04 DIAGNOSIS — N138 Other obstructive and reflux uropathy: Secondary | ICD-10-CM

## 2017-01-04 DIAGNOSIS — I639 Cerebral infarction, unspecified: Secondary | ICD-10-CM

## 2017-01-04 MED ORDER — TAMSULOSIN HCL 0.4 MG PO CAPS: 0.4000 mg | ORAL_CAPSULE | Freq: Every day | ORAL | 3 refills | Status: DC

## 2017-01-04 NOTE — Progress Notes (Signed)
Subjective:    Patient ID: Luis Yu, male    DOB: 09-18-1945, 71 y.o.   MRN: [id number]  HPI  71 year old male who  has a past medical history of Adjustment disorder with mixed emotional features; Atrial flutter (HCC); CAD (coronary artery disease); Depression; Diabetes mellitus type II, uncontrolled (HCC); Diabetic peripheral neuropathy (HCC); Erectile dysfunction; GERD (gastroesophageal reflux disease); Herpes zoster; Hyperlipidemia; Hypertension; Obesity; Osteoarthritis; Postherpetic neuralgia; and Sleep apnea. He presents to the office today for follow up after being seen in the ER, 5 days prior with the complaint of right lower back pain x 2-3 days. Denied any injury to his back and has had no increased physical exertion that would contribute. Pain at this time was worse with palpation and movement. No new falls. No numbness, tingling or focal weakness. He does have chronic diabetic peripheral neuropathy which is unchanged. No bowel or bladder incontinence.  In the ER, CT showed no acute abnormality, labs and EKG were reassuring . He has severe arthritis in his lumbar spine and hip. He was able to ambulate in the ER with his walker and pain had improved. He was given a prescription for Percocet ( which was never filled).   Today in the office he reports that his pain has improved back to baseline.   His only concern today in the office is that of frequent urinary frequency and " dribbling". His symptoms are more present at night and he is having to wake up multiple times throughout the night to use the restroom. He feels as though his stream is not as strong as it once was. He also feels as though he is not emptying his bladder completely. This has been a chronic issue for multiple years, he was on Flomax at one point in time but stopped taking it.   Review of Systems See HPI   Past Medical History:  Diagnosis Date  . Adjustment disorder with mixed emotional features   . Atrial flutter  (HCC)   . CAD (coronary artery disease)   . Depression   . Diabetes mellitus type II, uncontrolled (HCC)   . Diabetic peripheral neuropathy (HCC)   . Erectile dysfunction   . GERD (gastroesophageal reflux disease)   . Herpes zoster   . Hyperlipidemia   . Hypertension   . Obesity   . Osteoarthritis   . Postherpetic neuralgia   . Sleep apnea     Social History   Social History  . Marital status: Married    Spouse name: [name]  . Number of children: 1  . Years of education: 13   Occupational History  . Retired Naval architect Retired   Social History Main Topics  . Smoking status: Never Smoker  . Smokeless tobacco: Never Used  . Alcohol use 1.2 oz/week    2 Standard drinks or equivalent per week     Comment: twice a month.  . Drug use: No  . Sexual activity: Yes    Partners: Female   Other Topics Concern  . Not on file   Social History Narrative   Married [name])   Regular exercise: none   Caffeine use: cup of coffee daily    Retired Naval architect   Caffeine- twice daily             Past Surgical History:  Procedure Laterality Date  . CARDIOVERSION N/A 11/10/2015   Procedure: CARDIOVERSION;  Surgeon: Laurey Morale, MD;  Location: [facility] ENDOSCOPY;  Service: Cardiovascular;  Laterality:  N/A;  . CARDIOVERSION N/A 02/21/2016   Procedure: CARDIOVERSION;  Surgeon: Lewayne Bunting, MD;  Location: [facility] ENDOSCOPY;  Service: Cardiovascular;  Laterality: N/A;  . CORONARY ARTERY BYPASS GRAFT  1996   eith a LIMA to the LAD, saphenous vein graft to the acute marginal, saphenous vein graft to the PDA and saphenous vein graft to the circumflex.  [name] KNEE ARTHROSCOPY  05/2004   right knee  . REPLACEMENT TOTAL KNEE BILATERAL    . SHOULDER SURGERY      Family History  Problem Relation Age of Onset  . Coronary artery disease Mother        deceased at 9  . Coronary artery disease Father        died age 74  . Stroke Unknown        half brother  . Stroke Brother     No Known  Allergies  Current Outpatient Prescriptions on File Prior to Visit  Medication Sig Dispense Refill  . acetaminophen (TYLENOL) 500 MG tablet Take 2 tablets (1,000 mg total) by mouth every 6 (six) hours as needed. (Patient taking differently: Take 1,000 mg by mouth every 6 (six) hours as needed for mild pain. ) 30 tablet 0  . apixaban (ELIQUIS) 5 MG TABS tablet Take 1 tablet (5 mg total) by mouth 2 (two) times daily. 180 tablet 3  . aspirin EC 81 MG tablet Take 1 tablet (81 mg total) by mouth daily.    [name] atorvastatin (LIPITOR) 80 MG tablet Take 1 tablet (80 mg total) by mouth daily. 90 tablet 3  . buPROPion (WELLBUTRIN SR) 150 MG 12 hr tablet Take 1 tablet (150 mg total) by mouth 2 (two) times daily. 180 tablet 1  . doxycycline (VIBRAMYCIN) 100 MG capsule Take 1 capsule (100 mg total) by mouth 2 (two) times daily. 14 capsule 0  . glipiZIDE (GLUCOTROL) 10 MG tablet Take 10 mg by mouth daily.    [name] glucose blood (ACCU-CHEK ACTIVE STRIPS) test strip Use as instructed 100 each 12  . Insulin Glargine (BASAGLAR KWIKPEN) 100 UNIT/ML SOPN Inject 0.25 mLs (25 Units total) into the skin at bedtime. (Patient taking differently: Inject 20 Units into the skin at bedtime. ) 3 pen 11  . Insulin Pen Needle (PEN NEEDLES 31GX5/16") 31G X 8 MM MISC Use to inject insulin 5 times a day. 100 each 11  . isosorbide mononitrate (IMDUR) 30 MG 24 hr tablet Take 1 tablet (30 mg total) by mouth daily. 90 tablet 3  . Lancets (ACCU-CHEK MULTICLIX) lancets Use up to 5 times a day to check blood sugar. 100 each 5  . losartan-hydrochlorothiazide (HYZAAR) 50-12.5 MG tablet TAKE 1 TABLET BY MOUTH EVERY DAY 90 tablet 1  . metFORMIN (GLUCOPHAGE) 1000 MG tablet TAKE 1 TABLET (1,000 MG TOTAL) BY MOUTH 2 (TWO) TIMES DAILY WITH A MEAL. 180 tablet 1  . mirtazapine (REMERON) 30 MG tablet TAKE 1 TABLET (30 MG TOTAL) BY MOUTH AT BEDTIME. 90 tablet 1  . nebivolol 20 MG TABS Take 1 tablet (20 mg total) by mouth daily. 30 tablet 1  . omeprazole  (PRILOSEC) 20 MG capsule TAKE ONE CAPSULE TWICE A DAY BEFORE A MEAL 180 capsule 0  . thiamine 100 MG tablet Take 1 tablet (100 mg total) by mouth daily. 90 tablet 3  . vitamin B-12 (CYANOCOBALAMIN) 1000 MCG tablet Take 2 tablets (2,000 mcg total) by mouth daily. 180 tablet 3  . ondansetron (ZOFRAN ODT) 4 MG disintegrating tablet Take 1 tablet (4 mg  total) by mouth every 8 (eight) hours as needed for nausea or vomiting. (Patient not taking: Reported on 01/04/2017) 20 tablet 0  . oxyCODONE-acetaminophen (PERCOCET/ROXICET) 5-325 MG tablet Take 1 tablet by mouth every 6 (six) hours as needed. (Patient not taking: Reported on 01/04/2017) 15 tablet 0   No current facility-administered medications on file prior to visit.     BP 140/82 (BP Location: Left Arm)   Temp (!) 97.5 F (36.4 C) (Oral)   Ht 6\' 2"  (1.88 m)   Wt 224 lb (101.6 kg)   BMI 28.76 kg/m       Objective:   Physical Exam  Constitutional: He is oriented to person, place, and time. He appears well-developed and well-nourished. No distress.  Cardiovascular: Normal rate, regular rhythm, normal heart sounds and intact distal pulses.  Exam reveals no gallop and no friction rub.   No murmur heard. Pulmonary/Chest: Effort normal and breath sounds normal. No respiratory distress. He has no wheezes. He has no rales. He exhibits no tenderness.  Musculoskeletal: Normal range of motion. He exhibits no edema, tenderness or deformity.  Walks with slow steady gait. Uses his walker today   Neurological: He is alert and oriented to person, place, and time.  Skin: Skin is warm and dry. No rash noted. He is not diaphoretic. No erythema. No pallor.  Psychiatric: He has a normal mood and affect. His behavior is normal. Judgment and thought content normal.  Vitals reviewed.     Assessment & Plan:  1. Lumbar spine pain - Back to baseline   2. BPH with urinary obstruction - Will restart on Flomax. Follow up if no improvement in 1-2 weeks  -  tamsulosin (FLOMAX) 0.4 MG CAPS capsule; Take 1 capsule (0.4 mg total) by mouth daily.  Dispense: 30 capsule; Refill: 3   Shirline Freesory Reana Chacko, NP

## 2017-01-07 DIAGNOSIS — M9901 Segmental and somatic dysfunction of cervical region: Secondary | ICD-10-CM | POA: Diagnosis not present

## 2017-01-07 DIAGNOSIS — M4723 Other spondylosis with radiculopathy, cervicothoracic region: Secondary | ICD-10-CM | POA: Diagnosis not present

## 2017-01-07 DIAGNOSIS — M9903 Segmental and somatic dysfunction of lumbar region: Secondary | ICD-10-CM | POA: Diagnosis not present

## 2017-01-07 DIAGNOSIS — M9904 Segmental and somatic dysfunction of sacral region: Secondary | ICD-10-CM | POA: Diagnosis not present

## 2017-01-08 DIAGNOSIS — M9904 Segmental and somatic dysfunction of sacral region: Secondary | ICD-10-CM | POA: Diagnosis not present

## 2017-01-08 DIAGNOSIS — M9901 Segmental and somatic dysfunction of cervical region: Secondary | ICD-10-CM | POA: Diagnosis not present

## 2017-01-08 DIAGNOSIS — M9903 Segmental and somatic dysfunction of lumbar region: Secondary | ICD-10-CM | POA: Diagnosis not present

## 2017-01-08 DIAGNOSIS — M4723 Other spondylosis with radiculopathy, cervicothoracic region: Secondary | ICD-10-CM | POA: Diagnosis not present

## 2017-01-09 DIAGNOSIS — M9903 Segmental and somatic dysfunction of lumbar region: Secondary | ICD-10-CM | POA: Diagnosis not present

## 2017-01-09 DIAGNOSIS — M4723 Other spondylosis with radiculopathy, cervicothoracic region: Secondary | ICD-10-CM | POA: Diagnosis not present

## 2017-01-09 DIAGNOSIS — M9904 Segmental and somatic dysfunction of sacral region: Secondary | ICD-10-CM | POA: Diagnosis not present

## 2017-01-09 DIAGNOSIS — M9901 Segmental and somatic dysfunction of cervical region: Secondary | ICD-10-CM | POA: Diagnosis not present

## 2017-01-10 DIAGNOSIS — M9901 Segmental and somatic dysfunction of cervical region: Secondary | ICD-10-CM | POA: Diagnosis not present

## 2017-01-10 DIAGNOSIS — M9904 Segmental and somatic dysfunction of sacral region: Secondary | ICD-10-CM | POA: Diagnosis not present

## 2017-01-10 DIAGNOSIS — M4723 Other spondylosis with radiculopathy, cervicothoracic region: Secondary | ICD-10-CM | POA: Diagnosis not present

## 2017-01-10 DIAGNOSIS — M9903 Segmental and somatic dysfunction of lumbar region: Secondary | ICD-10-CM | POA: Diagnosis not present

## 2017-01-14 DIAGNOSIS — B0229 Other postherpetic nervous system involvement: Secondary | ICD-10-CM | POA: Insufficient documentation

## 2017-01-14 DIAGNOSIS — I4892 Unspecified atrial flutter: Secondary | ICD-10-CM | POA: Insufficient documentation

## 2017-01-14 DIAGNOSIS — I2510 Atherosclerotic heart disease of native coronary artery without angina pectoris: Secondary | ICD-10-CM | POA: Insufficient documentation

## 2017-01-14 DIAGNOSIS — E1165 Type 2 diabetes mellitus with hyperglycemia: Secondary | ICD-10-CM | POA: Insufficient documentation

## 2017-01-14 DIAGNOSIS — IMO0002 Reserved for concepts with insufficient information to code with codable children: Secondary | ICD-10-CM | POA: Insufficient documentation

## 2017-01-14 DIAGNOSIS — E1142 Type 2 diabetes mellitus with diabetic polyneuropathy: Secondary | ICD-10-CM | POA: Insufficient documentation

## 2017-01-14 DIAGNOSIS — K219 Gastro-esophageal reflux disease without esophagitis: Secondary | ICD-10-CM | POA: Insufficient documentation

## 2017-01-14 DIAGNOSIS — I10 Essential (primary) hypertension: Secondary | ICD-10-CM | POA: Insufficient documentation

## 2017-01-14 DIAGNOSIS — F329 Major depressive disorder, single episode, unspecified: Secondary | ICD-10-CM | POA: Insufficient documentation

## 2017-01-14 DIAGNOSIS — F32A Depression, unspecified: Secondary | ICD-10-CM | POA: Insufficient documentation

## 2017-01-14 DIAGNOSIS — G4730 Sleep apnea, unspecified: Secondary | ICD-10-CM | POA: Insufficient documentation

## 2017-01-14 DIAGNOSIS — M1990 Unspecified osteoarthritis, unspecified site: Secondary | ICD-10-CM | POA: Insufficient documentation

## 2017-01-14 DIAGNOSIS — F4329 Adjustment disorder with other symptoms: Secondary | ICD-10-CM | POA: Insufficient documentation

## 2017-01-14 DIAGNOSIS — E669 Obesity, unspecified: Secondary | ICD-10-CM | POA: Insufficient documentation

## 2017-01-14 DIAGNOSIS — N529 Male erectile dysfunction, unspecified: Secondary | ICD-10-CM | POA: Insufficient documentation

## 2017-01-17 NOTE — Progress Notes (Signed)
HPI: FU CAD and atrial flutter. Abdominal ultrasound in 2003 showed no aneurysm.Patient has had previous PCI of the saphenous vein graft to the PDA. Cardiac cath in Dec of 2011 revealed EF of 50. Continued patency internal mammary to the LAD. Occluded saphenous vein graft to the recent percutaneous intervention to the distal circumflex/PDA territory. Progressive disease in the saphenous vein graft to the OM. Patient evaluated by CVTS. Distal vessels felt to be poor targets. CABG could be considered in the future if PCI options were exhausted. Patient had PCI of the saphenous vein graft to the obtuse marginal. Nuclear study August 2017 showed ejection fraction 48% and no ischemia or infarction. Patient had cardioversion November 10 2015 and December 52017. Patient seen by Dr. Johney Frame for consideration of ablation and rate control recommended. Tikosyn could be considered. Patient had CVA April 2018.  Echocardiogram repeated April 2018 and showed normal LV systolic function, moderate left ventricular hypertrophy, mild biatrial enlargement, mild mitral regurgitation and moderately elevated pulmonary pressure. Carotid Dopplers April 2018 showed 1-39% bilateral stenosis. Since last seen, he continues to have dyspnea on exertion which is chronic. There is orthopnea but no PND, pedal edema, chest pain, palpitations or syncope. He has had occasional falls.  Current Outpatient Medications  Medication Sig Dispense Refill  . acetaminophen (TYLENOL) 500 MG tablet Take 2 tablets (1,000 mg total) by mouth every 6 (six) hours as needed. (Patient taking differently: Take 1,000 mg by mouth every 6 (six) hours as needed for mild pain. ) 30 tablet 0  . apixaban (ELIQUIS) 5 MG TABS tablet Take 1 tablet (5 mg total) by mouth 2 (two) times daily. 180 tablet 3  . aspirin EC 81 MG tablet Take 1 tablet (81 mg total) by mouth daily.    [name] atorvastatin (LIPITOR) 80 MG tablet Take 1 tablet (80 mg total) by mouth daily. 90 tablet  3  . buPROPion (WELLBUTRIN SR) 150 MG 12 hr tablet Take 1 tablet (150 mg total) by mouth 2 (two) times daily. 180 tablet 1  . doxycycline (VIBRAMYCIN) 100 MG capsule Take 1 capsule (100 mg total) by mouth 2 (two) times daily. 14 capsule 0  . glipiZIDE (GLUCOTROL) 10 MG tablet Take 10 mg by mouth daily.    [name] glucose blood (ACCU-CHEK ACTIVE STRIPS) test strip Use as instructed 100 each 12  . Insulin Glargine (BASAGLAR KWIKPEN) 100 UNIT/ML SOPN Inject 0.25 mLs (25 Units total) into the skin at bedtime. (Patient taking differently: Inject 20 Units into the skin at bedtime. ) 3 pen 11  . Insulin Pen Needle (PEN NEEDLES 31GX5/16") 31G X 8 MM MISC Use to inject insulin 5 times a day. 100 each 11  . isosorbide mononitrate (IMDUR) 30 MG 24 hr tablet Take 1 tablet (30 mg total) by mouth daily. 90 tablet 3  . Lancets (ACCU-CHEK MULTICLIX) lancets Use up to 5 times a day to check blood sugar. 100 each 5  . losartan-hydrochlorothiazide (HYZAAR) 50-12.5 MG tablet TAKE 1 TABLET BY MOUTH EVERY DAY 90 tablet 1  . metFORMIN (GLUCOPHAGE) 1000 MG tablet TAKE 1 TABLET (1,000 MG TOTAL) BY MOUTH 2 (TWO) TIMES DAILY WITH A MEAL. 180 tablet 1  . mirtazapine (REMERON) 30 MG tablet TAKE 1 TABLET (30 MG TOTAL) BY MOUTH AT BEDTIME. 90 tablet 1  . nebivolol 20 MG TABS Take 1 tablet (20 mg total) by mouth daily. 30 tablet 1  . omeprazole (PRILOSEC) 20 MG capsule TAKE ONE CAPSULE TWICE A DAY BEFORE A MEAL  180 capsule 0  . ondansetron (ZOFRAN ODT) 4 MG disintegrating tablet Take 1 tablet (4 mg total) by mouth every 8 (eight) hours as needed for nausea or vomiting. 20 tablet 0  . oxyCODONE-acetaminophen (PERCOCET/ROXICET) 5-325 MG tablet Take 1 tablet by mouth every 6 (six) hours as needed. 15 tablet 0  . tamsulosin (FLOMAX) 0.4 MG CAPS capsule Take 1 capsule (0.4 mg total) by mouth daily. 30 capsule 3  . thiamine 100 MG tablet Take 1 tablet (100 mg total) by mouth daily. 90 tablet 3  . vitamin B-12 (CYANOCOBALAMIN) 1000 MCG tablet  Take 2 tablets (2,000 mcg total) by mouth daily. 180 tablet 3   No current facility-administered medications for this visit.      Past Medical History:  Diagnosis Date  . Adjustment disorder with mixed emotional features   . Atrial flutter (HCC)   . CAD (coronary artery disease)   . Depression   . Diabetes mellitus type II, uncontrolled (HCC)   . Diabetic peripheral neuropathy (HCC)   . Erectile dysfunction   . GERD (gastroesophageal reflux disease)   . Herpes zoster   . Hyperlipidemia   . Hypertension   . Obesity   . Osteoarthritis   . Postherpetic neuralgia   . Sleep apnea     Past Surgical History:  Procedure Laterality Date  . CORONARY ARTERY BYPASS GRAFT  1996   eith a LIMA to the LAD, saphenous vein graft to the acute marginal, saphenous vein graft to the PDA and saphenous vein graft to the circumflex.  [name]. KNEE ARTHROSCOPY  05/2004   right knee  . REPLACEMENT TOTAL KNEE BILATERAL    . SHOULDER SURGERY      Social History   Socioeconomic History  . Marital status: Married    Spouse name: [name]  . Number of children: 1  . Years of education: 3213  . Highest education level: Not on file  Social Needs  . Financial resource strain: Not on file  . Food insecurity - worry: Not on file  . Food insecurity - inability: Not on file  . Transportation needs - medical: Not on file  . Transportation needs - non-medical: Not on file  Occupational History  . Occupation: Retired Publishing rights managertruck driver    Employer: RETIRED  Tobacco Use  . Smoking status: Never Smoker  . Smokeless tobacco: Never Used  Substance and Sexual Activity  . Alcohol use: Yes    Alcohol/week: 1.2 oz    Types: 2 Standard drinks or equivalent per week    Comment: twice a month.  . Drug use: No  . Sexual activity: Yes    Partners: Female  Other Topics Concern  . Not on file  Social History Narrative   Married [name]([name])   Regular exercise: none   Caffeine use: cup of coffee daily    Retired Naval architecttruck driver     Caffeine- twice daily          Family History  Problem Relation Age of Onset  . Coronary artery disease Mother        deceased at 7375  . Coronary artery disease Father        died age 72  . Stroke Unknown        half brother  . Stroke Brother     ROS: no fevers or chills, productive cough, hemoptysis, dysphasia, odynophagia, melena, hematochezia, dysuria, hematuria, rash, seizure activity, orthopnea, PND, pedal edema, claudication. Remaining systems are negative.  Physical Exam: Well-developed well-nourished in no acute  distress.  Skin is warm and dry.  HEENT is normal.  Neck is supple.  Chest is clear to auscultation with normal expansion.  Cardiovascular exam is irregular Abdominal exam nontender or distended. No masses palpated. Extremities show no edema. neuro grossly intact  A/P  1 atrial flutter-continue beta blocker for rate control (change from nebivolol to metoprolol 25 mg BID for cost reasons). Continue apixaban. Plan is rate control anticoagulation. Given prior CVA he will need lifelong anticoagulation. He has fallen and I have encouraged him to use his walker and explained the risks of traumatic bleeding. I would be hesitant to discontinue anticoagulation given prior CVAs.  2 coronary artery disease-continue statin. I will DC asa and treat with plavix.  3 hypertension-blood pressure is borderline. Continue present medications and follow.  4 hyperlipidemia-continue statin.  5 dyspnea-this is a long-standing issue. I think there is a significant component of deconditioning as he has limited activities. His wife also thinks he is depressed. He is not volume overloaded on examination. I will not pursue further cardiac evaluation at this point. He would benefit from physical therapy and I have encouraged him to increase his activities.  Olga Millers, MD

## 2017-01-18 ENCOUNTER — Ambulatory Visit: Payer: Medicare Other | Admitting: Adult Health

## 2017-01-24 ENCOUNTER — Ambulatory Visit (INDEPENDENT_AMBULATORY_CARE_PROVIDER_SITE_OTHER): Payer: Medicare Other | Admitting: Adult Health

## 2017-01-24 ENCOUNTER — Encounter: Payer: Self-pay | Admitting: Adult Health

## 2017-01-24 DIAGNOSIS — G4733 Obstructive sleep apnea (adult) (pediatric): Secondary | ICD-10-CM

## 2017-01-24 DIAGNOSIS — R269 Unspecified abnormalities of gait and mobility: Secondary | ICD-10-CM

## 2017-01-24 DIAGNOSIS — I639 Cerebral infarction, unspecified: Secondary | ICD-10-CM

## 2017-01-24 NOTE — Patient Instructions (Signed)
Continue on ASV sleep machine .  Do not drive if sleepy.  Work on healthy weight .  Follow up with Dr. Vassie LollAlva  In 6 months and  As needed   Follow up with Primary MD for balance issues and swallow issues.

## 2017-01-24 NOTE — Assessment & Plan Note (Signed)
Improved control on ASV device  Encouraged on compliance   Plan  Patient Instructions  Continue on ASV sleep machine .  Do not drive if sleepy.  Work on healthy weight .  Follow up with Dr. Vassie LollAlva  In 6 months and  As needed   Follow up with Primary MD for balance issues and swallow issues.

## 2017-01-24 NOTE — Progress Notes (Signed)
 @Patient  ID: Luis Yu, male    DOB: 23-Nov-1945, 71 y.o.   MRN: [id number]  Chief Complaint  Patient presents with  . Follow-up    OSA     Referring provider: Shirline FreesNafziger, [name], NP  HPI: 71 year old male never smoker followed for obstructive sleep apnea Has diabetes, coronary disease status post CABG, atrial fibrillation, CVA    Significant tests/ events   PSG 6/27 /11 - wt 246 lbs -showed severe obstructive sleep apnea with AHI 50/h &desaturation to 87%, corrected by CPAP 10 cm - he did not tolerate a full face mask   Jan 2012 download on 12 cm shows leak ++, residual AHI 21/h,centrals 5/h, good usage Download on 12 cm 3/6-06/20/10 shows increased centrals 11/h, with AHI 28/h, hypopneas 11/h  2D Echo >done 11/301/7 with nl ef/ Mod LAE/ RAE  Referral to Cardiology >atrial flutter cardioverted again 02/21/16 only a little less sob  CT Angio Chest 02/16/16: No acute pulmonary embolus. Trace left and small right pleural effusions   03/2016 Trial of SV Advance EPAP 7 cmH2O, Pressure Support Min 4 and Max 15 cmH2O with Max Pressure of 15 cmH2O   01/24/2017 Follow up : OSA  Patient returns for follow-up for sleep apnea.  Patient is maintained on ASV EPAP 4, Min PS 4cmH20, and 15cmPS . He is doing some better, but still gets up frequently at night and takes off machine . Has tried several masks. He feels it is helping with some less sleepiness.  Download shows good compliance , wearing most nights  , gets in 4-5 hr . AHI is improved at 12/hr.   His wife is with him today and helps with care. He has balance issues.  Fell this morning onto bottom. No head injury . No LOC . No obvious bruising .  He is on Eliqus . No known bleeding.      No Known Allergies  Immunization History  Administered Date(s) Administered  . Influenza Split 12/15/2010  . Influenza Whole 01/20/1996, 02/10/2007, 12/21/2008, 12/06/2009  . Influenza, High Dose Seasonal PF 02/18/2015, 11/22/2015,  11/30/2016  . Influenza,inj,Quad PF,6+ Mos 02/09/2013, 02/08/2014  . Pneumococcal Polysaccharide-23 02/10/2007  . Td 03/20/2001  . Tdap 07/25/2011  . Zoster 05/30/2009, 05/31/2009    Past Medical History:  Diagnosis Date  . Adjustment disorder with mixed emotional features   . Atrial flutter (HCC)   . CAD (coronary artery disease)   . Depression   . Diabetes mellitus type II, uncontrolled (HCC)   . Diabetic peripheral neuropathy (HCC)   . Erectile dysfunction   . GERD (gastroesophageal reflux disease)   . Herpes zoster   . Hyperlipidemia   . Hypertension   . Obesity   . Osteoarthritis   . Postherpetic neuralgia   . Sleep apnea     Tobacco History: Social History   Tobacco Use  Smoking Status Never Smoker  Smokeless Tobacco Never Used   Counseling given: Not Answered   Outpatient Encounter Medications as of 01/24/2017  Medication Sig  . acetaminophen (TYLENOL) 500 MG tablet Take 2 tablets (1,000 mg total) by mouth every 6 (six) hours as needed. (Patient taking differently: Take 1,000 mg by mouth every 6 (six) hours as needed for mild pain. )  . apixaban (ELIQUIS) 5 MG TABS tablet Take 1 tablet (5 mg total) by mouth 2 (two) times daily.  [name]. aspirin EC 81 MG tablet Take 1 tablet (81 mg total) by mouth daily.  [name]. atorvastatin (LIPITOR) 80 MG tablet Take  1 tablet (80 mg total) by mouth daily.  [name] buPROPion (WELLBUTRIN SR) 150 MG 12 hr tablet Take 1 tablet (150 mg total) by mouth 2 (two) times daily.  [name] glipiZIDE (GLUCOTROL) 10 MG tablet Take 10 mg by mouth daily.  [name] glucose blood (ACCU-CHEK ACTIVE STRIPS) test strip Use as instructed  . Insulin Glargine (BASAGLAR KWIKPEN) 100 UNIT/ML SOPN Inject 0.25 mLs (25 Units total) into the skin at bedtime. (Patient taking differently: Inject 20 Units into the skin at bedtime. )  . Insulin Pen Needle (PEN NEEDLES 31GX5/16") 31G X 8 MM MISC Use to inject insulin 5 times a day.  . isosorbide mononitrate (IMDUR) 30 MG 24 hr tablet Take 1  tablet (30 mg total) by mouth daily.  . Lancets (ACCU-CHEK MULTICLIX) lancets Use up to 5 times a day to check blood sugar.  . losartan-hydrochlorothiazide (HYZAAR) 50-12.5 MG tablet TAKE 1 TABLET BY MOUTH EVERY DAY  . metFORMIN (GLUCOPHAGE) 1000 MG tablet TAKE 1 TABLET (1,000 MG TOTAL) BY MOUTH 2 (TWO) TIMES DAILY WITH A MEAL.  . mirtazapine (REMERON) 30 MG tablet TAKE 1 TABLET (30 MG TOTAL) BY MOUTH AT BEDTIME.  . nebivolol 20 MG TABS Take 1 tablet (20 mg total) by mouth daily.  [name] omeprazole (PRILOSEC) 20 MG capsule TAKE ONE CAPSULE TWICE A DAY BEFORE A MEAL  . thiamine 100 MG tablet Take 1 tablet (100 mg total) by mouth daily.  . vitamin B-12 (CYANOCOBALAMIN) 1000 MCG tablet Take 2 tablets (2,000 mcg total) by mouth daily.  [name] doxycycline (VIBRAMYCIN) 100 MG capsule Take 1 capsule (100 mg total) by mouth 2 (two) times daily. (Patient not taking: Reported on 01/24/2017)  . ondansetron (ZOFRAN ODT) 4 MG disintegrating tablet Take 1 tablet (4 mg total) by mouth every 8 (eight) hours as needed for nausea or vomiting. (Patient not taking: Reported on 01/04/2017)  . oxyCODONE-acetaminophen (PERCOCET/ROXICET) 5-325 MG tablet Take 1 tablet by mouth every 6 (six) hours as needed. (Patient not taking: Reported on 01/04/2017)  . tamsulosin (FLOMAX) 0.4 MG CAPS capsule Take 1 capsule (0.4 mg total) by mouth daily. (Patient not taking: Reported on 01/24/2017)   No facility-administered encounter medications on file as of 01/24/2017.      Review of Systems  Constitutional:   No  weight loss, night sweats,  Fevers, chills,  +fatigue, or  lassitude.  HEENT:   No headaches,  Difficulty swallowing,  Tooth/dental problems, or  Sore throat,                No sneezing, itching, ear ache, nasal congestion, post nasal drip,   CV:  No chest pain,  Orthopnea, PND, swelling in lower extremities, anasarca, dizziness, palpitations, syncope.   GI  No heartburn, indigestion, abdominal pain, nausea, vomiting, diarrhea,  change in bowel habits, loss of appetite, bloody stools.   Resp:    No excess mucus, no productive cough,  No non-productive cough,  No coughing up of blood.  No change in color of mucus.  No wheezing.  No chest wall deformity  Skin: no rash or lesions.  GU: no dysuria, change in color of urine, no urgency or frequency.  No flank pain, no hematuria   MS:  No joint pain or swelling.  No decreased range of motion.  No back pain.    Physical Exam  BP 134/70 (BP Location: Left Arm, Cuff Size: Normal)   Pulse 74   Ht 6\' 2"  (1.88 m)   Wt 219 lb (99.3 kg)  SpO2 97%   BMI 28.12 kg/m   GEN: A/Ox3; pleasant , NAD, chronically ill appearing , walker    HEENT:  [place]/AT,  EACs-clear, TMs-wnl, NOSE-clear, THROAT-clear, no lesions, no postnasal drip or exudate noted.   NECK:  Supple w/ fair ROM; no JVD; normal carotid impulses w/o bruits; no thyromegaly or nodules palpated; no lymphadenopathy.    RESP  Clear  P & A; w/o, wheezes/ rales/ or rhonchi. no accessory muscle use, no dullness to percussion  CARD:  RRR, no m/r/g, no peripheral edema, pulses intact, no cyanosis or clubbing.  GI:   Soft & nt; nml bowel sounds; no organomegaly or masses detected.   Musco: Warm bil, no deformities or joint swelling noted.   Neuro: alert, no focal deficits noted.    Skin: Warm, no lesions or rashes    Lab Results: Imaging:   Assessment & Plan:   OSA (obstructive sleep apnea) Improved control on ASV device  Encouraged on compliance   Plan  Patient Instructions  Continue on ASV sleep machine .  Do not drive if sleepy.  Work on healthy weight .  Follow up with Dr. Vassie LollAlva  In 6 months and  As needed   Follow up with Primary MD for balance issues and swallow issues.        Abnormality of gait Balance issues , discuss with PCP      Rubye Oaksammy Ivann Trimarco, NP 01/24/2017

## 2017-01-24 NOTE — Assessment & Plan Note (Signed)
Balance issues , discuss with PCP

## 2017-01-28 ENCOUNTER — Ambulatory Visit (INDEPENDENT_AMBULATORY_CARE_PROVIDER_SITE_OTHER): Payer: Medicare Other | Admitting: Cardiology

## 2017-01-28 ENCOUNTER — Encounter: Payer: Self-pay | Admitting: Cardiology

## 2017-01-28 ENCOUNTER — Other Ambulatory Visit: Payer: Self-pay | Admitting: *Deleted

## 2017-01-28 VITALS — BP 146/80 | HR 78 | Ht 74.0 in | Wt 221.0 lb

## 2017-01-28 DIAGNOSIS — I10 Essential (primary) hypertension: Secondary | ICD-10-CM

## 2017-01-28 DIAGNOSIS — I2510 Atherosclerotic heart disease of native coronary artery without angina pectoris: Secondary | ICD-10-CM

## 2017-01-28 DIAGNOSIS — E7800 Pure hypercholesterolemia, unspecified: Secondary | ICD-10-CM

## 2017-01-28 DIAGNOSIS — I639 Cerebral infarction, unspecified: Secondary | ICD-10-CM

## 2017-01-28 DIAGNOSIS — I4892 Unspecified atrial flutter: Secondary | ICD-10-CM | POA: Diagnosis not present

## 2017-01-28 MED ORDER — CLOPIDOGREL BISULFATE 75 MG PO TABS: 75.0000 mg | ORAL_TABLET | Freq: Every day | ORAL | 3 refills | Status: DC

## 2017-01-28 MED ORDER — METOPROLOL TARTRATE 25 MG PO TABS: 25.0000 mg | ORAL_TABLET | Freq: Two times a day (BID) | ORAL | 3 refills | Status: DC

## 2017-01-28 NOTE — Patient Instructions (Signed)
Medication Instructions:   STOP ASPIRIN  START PLAVIX 75 MG ONCE DAILY  STOP BYSTOLIC WHEN CURRENT SUPPLY GONE  METOPROLOL TARTRATE 25 MG TWICE DAILY TO REPLACE BYSTOLIC  Follow-Up:  Your physician wants you to follow-up in: 6 MONTHS WITH DR Jens SomRENSHAW You will receive a reminder letter in the mail two months in advance. If you don't receive a letter, please call our office to schedule the follow-up appointment.   If you need a refill on your cardiac medications before your next appointment, please call your pharmacy.

## 2017-01-29 DIAGNOSIS — M9904 Segmental and somatic dysfunction of sacral region: Secondary | ICD-10-CM | POA: Diagnosis not present

## 2017-01-29 DIAGNOSIS — M9901 Segmental and somatic dysfunction of cervical region: Secondary | ICD-10-CM | POA: Diagnosis not present

## 2017-01-29 DIAGNOSIS — M9907 Segmental and somatic dysfunction of upper extremity: Secondary | ICD-10-CM | POA: Diagnosis not present

## 2017-01-29 DIAGNOSIS — M9903 Segmental and somatic dysfunction of lumbar region: Secondary | ICD-10-CM | POA: Diagnosis not present

## 2017-01-30 ENCOUNTER — Encounter: Payer: Self-pay | Admitting: Podiatry

## 2017-01-30 ENCOUNTER — Ambulatory Visit (INDEPENDENT_AMBULATORY_CARE_PROVIDER_SITE_OTHER): Payer: Medicare Other | Admitting: Podiatry

## 2017-01-30 DIAGNOSIS — M9903 Segmental and somatic dysfunction of lumbar region: Secondary | ICD-10-CM | POA: Diagnosis not present

## 2017-01-30 DIAGNOSIS — M9904 Segmental and somatic dysfunction of sacral region: Secondary | ICD-10-CM | POA: Diagnosis not present

## 2017-01-30 DIAGNOSIS — E1140 Type 2 diabetes mellitus with diabetic neuropathy, unspecified: Secondary | ICD-10-CM | POA: Diagnosis not present

## 2017-01-30 DIAGNOSIS — M79609 Pain in unspecified limb: Secondary | ICD-10-CM | POA: Diagnosis not present

## 2017-01-30 DIAGNOSIS — B351 Tinea unguium: Secondary | ICD-10-CM | POA: Diagnosis not present

## 2017-01-30 DIAGNOSIS — Z794 Long term (current) use of insulin: Secondary | ICD-10-CM

## 2017-01-30 DIAGNOSIS — M9907 Segmental and somatic dysfunction of upper extremity: Secondary | ICD-10-CM | POA: Diagnosis not present

## 2017-01-30 DIAGNOSIS — M9901 Segmental and somatic dysfunction of cervical region: Secondary | ICD-10-CM | POA: Diagnosis not present

## 2017-01-30 DIAGNOSIS — D689 Coagulation defect, unspecified: Secondary | ICD-10-CM

## 2017-01-30 NOTE — Progress Notes (Signed)
Patient ID: Luis Yu, male   DOB: 1945/12/26, 71 y.o.   MRN: [id number] Complaint:  Visit Type: Patient returns to my office for continued preventative foot care services. Complaint: Patient states" my nails have grown long and thick and become painful to walk and wear shoes" Patient has been diagnosed with DM with neuropathy.   Podiatric Exam: Vascular: dorsalis pedis and posterior tibial pulses are palpable bilateral. Capillary return is immediate. Cold feet noted.. Skin turgor WNL  Sensorium: Normal Semmes Weinstein monofilament test. Normal tactile sensation bilaterally. Nail Exam: Pt has thick disfigured discolored nails with subungual debris noted bilateral entire nail hallux through fifth toenails Ulcer Exam: There is no evidence of ulcer or pre-ulcerative changes or infection. Orthopedic Exam: Muscle tone and strength are WNL. No limitations in general ROM. No crepitus or effusions noted. HAV 1st MPJ  B/L.  S/P fracture great toe right foot. Skin: No Porokeratosis. No infection or ulcers.  Right hallux was normal color with no increased temperature.   Diagnosis:  Onychomycosis, , Pain in right toe, pain in left toes    Treatment & Plan Procedures and Treatment: Consent by patient was obtained for treatment procedures. The patient understood the discussion of treatment and procedures well. All questions were answered thoroughly reviewed. Debridement of mycotic and hypertrophic toenails, 1 through 5 bilateral and clearing of subungual debris. No ulceration, no infection noted.   Return Visit-Office Procedure: Patient instructed to return to the office for a follow up visit 3 months for continued evaluation and treatment.    Helane GuntherGregory Jolonda Gomm DPM

## 2017-01-31 DIAGNOSIS — M9903 Segmental and somatic dysfunction of lumbar region: Secondary | ICD-10-CM | POA: Diagnosis not present

## 2017-01-31 DIAGNOSIS — M9904 Segmental and somatic dysfunction of sacral region: Secondary | ICD-10-CM | POA: Diagnosis not present

## 2017-01-31 DIAGNOSIS — M9907 Segmental and somatic dysfunction of upper extremity: Secondary | ICD-10-CM | POA: Diagnosis not present

## 2017-01-31 DIAGNOSIS — M9901 Segmental and somatic dysfunction of cervical region: Secondary | ICD-10-CM | POA: Diagnosis not present

## 2017-02-01 ENCOUNTER — Ambulatory Visit: Payer: Medicare Other | Admitting: Adult Health

## 2017-02-01 NOTE — Progress Notes (Signed)
Reviewed & agree with plan  

## 2017-02-05 DIAGNOSIS — M9907 Segmental and somatic dysfunction of upper extremity: Secondary | ICD-10-CM | POA: Diagnosis not present

## 2017-02-05 DIAGNOSIS — M9901 Segmental and somatic dysfunction of cervical region: Secondary | ICD-10-CM | POA: Diagnosis not present

## 2017-02-05 DIAGNOSIS — M9904 Segmental and somatic dysfunction of sacral region: Secondary | ICD-10-CM | POA: Diagnosis not present

## 2017-02-05 DIAGNOSIS — M9903 Segmental and somatic dysfunction of lumbar region: Secondary | ICD-10-CM | POA: Diagnosis not present

## 2017-02-14 DIAGNOSIS — E113493 Type 2 diabetes mellitus with severe nonproliferative diabetic retinopathy without macular edema, bilateral: Secondary | ICD-10-CM | POA: Diagnosis not present

## 2017-02-14 DIAGNOSIS — Z961 Presence of intraocular lens: Secondary | ICD-10-CM | POA: Diagnosis not present

## 2017-02-14 DIAGNOSIS — H04123 Dry eye syndrome of bilateral lacrimal glands: Secondary | ICD-10-CM | POA: Diagnosis not present

## 2017-02-14 LAB — HM DIABETES EYE EXAM

## 2017-02-15 ENCOUNTER — Telehealth: Payer: Self-pay | Admitting: Adult Health

## 2017-02-15 NOTE — Telephone Encounter (Signed)
[facility] that the office notes from 11/8 are the notes they need for pt's compliance for his cpap-tr

## 2017-02-15 NOTE — Telephone Encounter (Signed)
Spoke with pt's wife and she states Francis DowseJoel told her that they didn't need the appointment. I will call Francis DowseJoel and see what we have to do.

## 2017-02-15 NOTE — Telephone Encounter (Signed)
I faxed the papers and added attn to Francis DowseJoel to [phone]. Nothing further is needed.

## 2017-02-15 NOTE — Telephone Encounter (Signed)
As were speaking [facility] was calling her. I advised her to talk with them and if they needed appt then to call back. Nothing further is needed.

## 2017-02-15 NOTE — Telephone Encounter (Signed)
[facility] dated 11.25.18:  Request for Supporting Documentation on CPAP/BiPAP Device ".[name].[name].[facility] has received a referral for a Respiratory Assist Device (CPAP or BiPAP Therapy) for your patient.  In order for the patient to meet insurance coverage and to prevent out of the pocket cost to your patient, we need the following documentation:  Follow up in-office (Face to Face) visit note.  This visit must take place between the 31st and 90th day of service following setup of the device.  The note must document the compliance (4> hours 70% of the time at least 21 days out of the 30) and some clinical improvement for the patient such as decreased daytime sleepiness or patient stopped snoring etc.  If we do not have this information, the insurance will not cover service and the patient will be billed for on-going services and rental of equipment.  Date of follow up needs to be 02/01/17 or after.  EPAP pressure was changed on 10.9.18 by RA and last office was 11.8.18 w/ TP  LMOM TCB x1 for pt to schedule follow up appt

## 2017-02-19 ENCOUNTER — Ambulatory Visit: Payer: Medicare Other | Admitting: Adult Health

## 2017-02-20 DIAGNOSIS — H3562 Retinal hemorrhage, left eye: Secondary | ICD-10-CM | POA: Diagnosis not present

## 2017-02-20 DIAGNOSIS — H353131 Nonexudative age-related macular degeneration, bilateral, early dry stage: Secondary | ICD-10-CM | POA: Diagnosis not present

## 2017-02-20 DIAGNOSIS — E113493 Type 2 diabetes mellitus with severe nonproliferative diabetic retinopathy without macular edema, bilateral: Secondary | ICD-10-CM | POA: Diagnosis not present

## 2017-02-20 DIAGNOSIS — H43813 Vitreous degeneration, bilateral: Secondary | ICD-10-CM | POA: Diagnosis not present

## 2017-02-22 ENCOUNTER — Other Ambulatory Visit: Payer: Self-pay | Admitting: Adult Health

## 2017-02-22 ENCOUNTER — Telehealth: Payer: Self-pay | Admitting: Adult Health

## 2017-02-22 ENCOUNTER — Telehealth: Payer: Self-pay | Admitting: Pulmonary Disease

## 2017-02-22 MED ORDER — PENICILLIN V POTASSIUM 500 MG PO TABS: 500.0000 mg | ORAL_TABLET | Freq: Four times a day (QID) | ORAL | 0 refills | Status: AC

## 2017-02-22 NOTE — Telephone Encounter (Signed)
Tried returning call on home #.  No answer or machine.  Tried cell and left a message for a return call.

## 2017-02-22 NOTE — Telephone Encounter (Signed)
[name] aware abx at the pharmacy

## 2017-02-22 NOTE — Telephone Encounter (Signed)
Spoke with patient's spouse Luis Yu about the 11.30.18 phone note - nothing further needed for that issue  While speaking with spouse [name], she reported that pt's ASV keeps automatically shutting off while patient is sleeping.  She states that it goes from the green smiley face to the red frowning face but she is unsure why.  She stated that she has called [facility] regarding this issue and was informed that patient is breathing too shallowly but she is unable to get him to take a deep breath without fully waking him up.  When [facility] last (1 week ago) she was informed that they will send patient a new mask that has memory foam rather than silicon in hopes this would help the issue - she has not received this yet but assured me [facility].  In the meantime, patient is asking for Dr Reginia NaasAlva's thoughts/recommendations on why his machine could be cutting off.  RA please advise, thank you.

## 2017-02-22 NOTE — Telephone Encounter (Signed)
Not sure why May have to take it in to DME to trouble shoot

## 2017-02-22 NOTE — Telephone Encounter (Signed)
[place]

## 2017-02-22 NOTE — Telephone Encounter (Signed)
Called pt and advised message from the provider. Pt understood and verbalized understanding. Nothing further is needed.    

## 2017-02-22 NOTE — Telephone Encounter (Signed)
Copied from CRM [id number]. Topic: Quick Communication - Rx Refill/Question >> Feb 22, 2017  8:54 AM Crist InfanteHarrald, Kathy J wrote: Wife called to ask if Kandee KeenCory will send in a Rx for  penicillin v potassium (VEETID) 500 MG tablet  Pt has tooth infection and he cannot get off his blood thinners long enough to get his teeth fixed. She states Kandee KeenCory is aware.  Pt had appt on Tuesday, but concerned for the weather on Tuesday. Pt did rescheduled for Friday 12/14. But pt in a lot of pain in his mouth  [id number] - [name], [place] - [address] [phone] (Phone) [phone] (Fax)

## 2017-02-26 ENCOUNTER — Ambulatory Visit: Payer: Medicare Other | Admitting: Adult Health

## 2017-03-01 ENCOUNTER — Ambulatory Visit (INDEPENDENT_AMBULATORY_CARE_PROVIDER_SITE_OTHER): Payer: Medicare Other | Admitting: Adult Health

## 2017-03-01 ENCOUNTER — Encounter: Payer: Self-pay | Admitting: Adult Health

## 2017-03-01 VITALS — BP 120/60 | HR 68 | Temp 97.4°F | Wt 217.0 lb

## 2017-03-01 DIAGNOSIS — R3914 Feeling of incomplete bladder emptying: Secondary | ICD-10-CM | POA: Diagnosis not present

## 2017-03-01 DIAGNOSIS — N401 Enlarged prostate with lower urinary tract symptoms: Secondary | ICD-10-CM | POA: Diagnosis not present

## 2017-03-01 DIAGNOSIS — I639 Cerebral infarction, unspecified: Secondary | ICD-10-CM

## 2017-03-01 DIAGNOSIS — F339 Major depressive disorder, recurrent, unspecified: Secondary | ICD-10-CM

## 2017-03-01 NOTE — Progress Notes (Signed)
Subjective:    Patient ID: Luis Yu, male    DOB: 31-Dec-1945, 70 y.o.   MRN: [id number]  HPI  71 year old male who  has a past medical history of Adjustment disorder with mixed emotional features, Atrial flutter (HCC), CAD (coronary artery disease), Depression, Diabetes mellitus type II, uncontrolled (HCC), Diabetic peripheral neuropathy (HCC), Erectile dysfunction, GERD (gastroesophageal reflux disease), Herpes zoster, Hyperlipidemia, Hypertension, Obesity, Osteoarthritis, Postherpetic neuralgia, and Sleep apnea.  He presents to the office today for two month follow up regarding  BPH with urinary retention. During the last visit he was started on Flomax. His wife reports that he stopped taking this medication because it was making him more agitated, past his baseline. Kolbie reports that he doesn't feel as though he is having as hard of a time urinating now.   His wife also reports that Bane continues to be depressed. He is currently taking Remeron 30 mg QHS which she does not feel like makes any improvement in his depression and is not helping him sleep. She reports that he is not doing anything that his other providers tell him to do, such as home exercises or wear his home c-pap. She is worried that he is deconditioning further.   Suleyman reports " I am doing just fine and I feel good." He does acknowledge that he is not wearing his Cpap or doing home exercises, this is because " I just don't do them".    Review of Systems See HPI   Past Medical History:  Diagnosis Date  . Adjustment disorder with mixed emotional features   . Atrial flutter (HCC)   . CAD (coronary artery disease)   . Depression   . Diabetes mellitus type II, uncontrolled (HCC)   . Diabetic peripheral neuropathy (HCC)   . Erectile dysfunction   . GERD (gastroesophageal reflux disease)   . Herpes zoster   . Hyperlipidemia   . Hypertension   . Obesity   . Osteoarthritis   . Postherpetic neuralgia   . Sleep  apnea     Social History   Socioeconomic History  . Marital status: Married    Spouse name: [name]  . Number of children: 1  . Years of education: 33  . Highest education level: Not on file  Social Needs  . Financial resource strain: Not on file  . Food insecurity - worry: Not on file  . Food insecurity - inability: Not on file  . Transportation needs - medical: Not on file  . Transportation needs - non-medical: Not on file  Occupational History  . Occupation: Retired Publishing rights manager: RETIRED  Tobacco Use  . Smoking status: Never Smoker  . Smokeless tobacco: Never Used  Substance and Sexual Activity  . Alcohol use: Yes    Alcohol/week: 1.2 oz    Types: 2 Standard drinks or equivalent per week    Comment: twice a month.  . Drug use: No  . Sexual activity: Yes    Partners: Female  Other Topics Concern  . Not on file  Social History Narrative   Married [name])   Regular exercise: none   Caffeine use: cup of coffee daily    Retired Naval architect   Caffeine- twice daily          Past Surgical History:  Procedure Laterality Date  . CARDIOVERSION N/A 11/10/2015   Procedure: CARDIOVERSION;  Surgeon: Laurey Morale, MD;  Location: [facility] ENDOSCOPY;  Service: Cardiovascular;  Laterality: N/A;  .  CARDIOVERSION N/A 02/21/2016   Procedure: CARDIOVERSION;  Surgeon: Lewayne BuntingBrian S Crenshaw, MD;  [facility];  Service: Cardiovascular;  Laterality: N/A;  . CORONARY ARTERY BYPASS GRAFT  1996   eith a LIMA to the LAD, saphenous vein graft to the acute marginal, saphenous vein graft to the PDA and saphenous vein graft to the circumflex.  [name]. KNEE ARTHROSCOPY  05/2004   right knee  . REPLACEMENT TOTAL KNEE BILATERAL    . SHOULDER SURGERY      Family History  Problem Relation Age of Onset  . Coronary artery disease Mother        deceased at 6975  . Coronary artery disease Father        died age 72  . Stroke Unknown        half brother  . Stroke Brother     No Known  Allergies  Current Outpatient Medications on File Prior to Visit  Medication Sig Dispense Refill  . acetaminophen (TYLENOL) 500 MG tablet Take 2 tablets (1,000 mg total) by mouth every 6 (six) hours as needed. (Patient taking differently: Take 1,000 mg by mouth every 6 (six) hours as needed for mild pain. ) 30 tablet 0  . apixaban (ELIQUIS) 5 MG TABS tablet Take 1 tablet (5 mg total) by mouth 2 (two) times daily. 180 tablet 3  . atorvastatin (LIPITOR) 80 MG tablet Take 1 tablet (80 mg total) by mouth daily. 90 tablet 3  . buPROPion (WELLBUTRIN SR) 150 MG 12 hr tablet Take 1 tablet (150 mg total) by mouth 2 (two) times daily. 180 tablet 1  . clopidogrel (PLAVIX) 75 MG tablet Take 1 tablet (75 mg total) daily by mouth. 90 tablet 3  . glipiZIDE (GLUCOTROL) 10 MG tablet Take 10 mg by mouth daily.    [name]. glucose blood (ACCU-CHEK ACTIVE STRIPS) test strip Use as instructed 100 each 12  . Insulin Glargine (BASAGLAR KWIKPEN) 100 UNIT/ML SOPN Inject 0.25 mLs (25 Units total) into the skin at bedtime. (Patient taking differently: Inject 20 Units into the skin at bedtime. ) 3 pen 11  . Insulin Pen Needle (PEN NEEDLES 31GX5/16") 31G X 8 MM MISC Use to inject insulin 5 times a day. 100 each 11  . isosorbide mononitrate (IMDUR) 30 MG 24 hr tablet Take 1 tablet (30 mg total) by mouth daily. 90 tablet 3  . Lancets (ACCU-CHEK MULTICLIX) lancets Use up to 5 times a day to check blood sugar. 100 each 5  . losartan-hydrochlorothiazide (HYZAAR) 50-12.5 MG tablet TAKE 1 TABLET BY MOUTH EVERY DAY 90 tablet 1  . metFORMIN (GLUCOPHAGE) 1000 MG tablet TAKE 1 TABLET (1,000 MG TOTAL) BY MOUTH 2 (TWO) TIMES DAILY WITH A MEAL. 180 tablet 1  . metoprolol tartrate (LOPRESSOR) 25 MG tablet Take 1 tablet (25 mg total) 2 (two) times daily by mouth. 180 tablet 3  . mirtazapine (REMERON) 30 MG tablet TAKE 1 TABLET (30 MG TOTAL) BY MOUTH AT BEDTIME. 90 tablet 1  . omeprazole (PRILOSEC) 20 MG capsule TAKE ONE CAPSULE TWICE A DAY BEFORE A  MEAL 180 capsule 0  . penicillin v potassium (VEETID) 500 MG tablet Take 1 tablet (500 mg total) by mouth 4 (four) times daily for 10 days. 40 tablet 0  . thiamine 100 MG tablet Take 1 tablet (100 mg total) by mouth daily. 90 tablet 3  . vitamin B-12 (CYANOCOBALAMIN) 1000 MCG tablet Take 2 tablets (2,000 mcg total) by mouth daily. 180 tablet 3   No current facility-administered medications  on file prior to visit.     BP 120/60   Pulse 68   Temp (!) 97.4 F (36.3 C) (Oral)   Wt 217 lb (98.4 kg)   SpO2 96%   BMI 27.86 kg/m     Past Medical History:  Diagnosis Date  . Adjustment disorder with mixed emotional features   . Atrial flutter (HCC)   . CAD (coronary artery disease)   . Depression   . Diabetes mellitus type II, uncontrolled (HCC)   . Diabetic peripheral neuropathy (HCC)   . Erectile dysfunction   . GERD (gastroesophageal reflux disease)   . Herpes zoster   . Hyperlipidemia   . Hypertension   . Obesity   . Osteoarthritis   . Postherpetic neuralgia   . Sleep apnea     Social History   Socioeconomic History  . Marital status: Married    Spouse name: [name]  . Number of children: 1  . Years of education: 3313  . Highest education level: Not on file  Social Needs  . Financial resource strain: Not on file  . Food insecurity - worry: Not on file  . Food insecurity - inability: Not on file  . Transportation needs - medical: Not on file  . Transportation needs - non-medical: Not on file  Occupational History  . Occupation: Retired Publishing rights managertruck driver    Employer: RETIRED  Tobacco Use  . Smoking status: Never Smoker  . Smokeless tobacco: Never Used  Substance and Sexual Activity  . Alcohol use: Yes    Alcohol/week: 1.2 oz    Types: 2 Standard drinks or equivalent per week    Comment: twice a month.  . Drug use: No  . Sexual activity: Yes    Partners: Female  Other Topics Concern  . Not on file  Social History Narrative   Married [name]([name])   Regular exercise:  none   Caffeine use: cup of coffee daily    Retired Naval architecttruck driver   Caffeine- twice daily          Past Surgical History:  Procedure Laterality Date  . CARDIOVERSION N/A 11/10/2015   Procedure: CARDIOVERSION;  Surgeon: Laurey Moralealton S McLean, MD;  [facility];  Service: Cardiovascular;  Laterality: N/A;  . CARDIOVERSION N/A 02/21/2016   Procedure: CARDIOVERSION;  Surgeon: Lewayne BuntingBrian S Crenshaw, MD;  [facility];  Service: Cardiovascular;  Laterality: N/A;  . CORONARY ARTERY BYPASS GRAFT  1996   eith a LIMA to the LAD, saphenous vein graft to the acute marginal, saphenous vein graft to the PDA and saphenous vein graft to the circumflex.  [name]. KNEE ARTHROSCOPY  05/2004   right knee  . REPLACEMENT TOTAL KNEE BILATERAL    . SHOULDER SURGERY      Family History  Problem Relation Age of Onset  . Coronary artery disease Mother        deceased at 175  . Coronary artery disease Father        died age 72  . Stroke Unknown        half brother  . Stroke Brother     No Known Allergies  Current Outpatient Medications on File Prior to Visit  Medication Sig Dispense Refill  . acetaminophen (TYLENOL) 500 MG tablet Take 2 tablets (1,000 mg total) by mouth every 6 (six) hours as needed. (Patient taking differently: Take 1,000 mg by mouth every 6 (six) hours as needed for mild pain. ) 30 tablet 0  . apixaban (ELIQUIS) 5 MG TABS tablet Take  1 tablet (5 mg total) by mouth 2 (two) times daily. 180 tablet 3  . atorvastatin (LIPITOR) 80 MG tablet Take 1 tablet (80 mg total) by mouth daily. 90 tablet 3  . buPROPion (WELLBUTRIN SR) 150 MG 12 hr tablet Take 1 tablet (150 mg total) by mouth 2 (two) times daily. 180 tablet 1  . clopidogrel (PLAVIX) 75 MG tablet Take 1 tablet (75 mg total) daily by mouth. 90 tablet 3  . glipiZIDE (GLUCOTROL) 10 MG tablet Take 10 mg by mouth daily.    [name] glucose blood (ACCU-CHEK ACTIVE STRIPS) test strip Use as instructed 100 each 12  . Insulin Glargine (BASAGLAR  KWIKPEN) 100 UNIT/ML SOPN Inject 0.25 mLs (25 Units total) into the skin at bedtime. (Patient taking differently: Inject 20 Units into the skin at bedtime. ) 3 pen 11  . Insulin Pen Needle (PEN NEEDLES 31GX5/16") 31G X 8 MM MISC Use to inject insulin 5 times a day. 100 each 11  . isosorbide mononitrate (IMDUR) 30 MG 24 hr tablet Take 1 tablet (30 mg total) by mouth daily. 90 tablet 3  . Lancets (ACCU-CHEK MULTICLIX) lancets Use up to 5 times a day to check blood sugar. 100 each 5  . losartan-hydrochlorothiazide (HYZAAR) 50-12.5 MG tablet TAKE 1 TABLET BY MOUTH EVERY DAY 90 tablet 1  . metFORMIN (GLUCOPHAGE) 1000 MG tablet TAKE 1 TABLET (1,000 MG TOTAL) BY MOUTH 2 (TWO) TIMES DAILY WITH A MEAL. 180 tablet 1  . metoprolol tartrate (LOPRESSOR) 25 MG tablet Take 1 tablet (25 mg total) 2 (two) times daily by mouth. 180 tablet 3  . mirtazapine (REMERON) 30 MG tablet TAKE 1 TABLET (30 MG TOTAL) BY MOUTH AT BEDTIME. 90 tablet 1  . omeprazole (PRILOSEC) 20 MG capsule TAKE ONE CAPSULE TWICE A DAY BEFORE A MEAL 180 capsule 0  . penicillin v potassium (VEETID) 500 MG tablet Take 1 tablet (500 mg total) by mouth 4 (four) times daily for 10 days. 40 tablet 0  . thiamine 100 MG tablet Take 1 tablet (100 mg total) by mouth daily. 90 tablet 3  . vitamin B-12 (CYANOCOBALAMIN) 1000 MCG tablet Take 2 tablets (2,000 mcg total) by mouth daily. 180 tablet 3   No current facility-administered medications on file prior to visit.     BP 120/60   Pulse 68   Temp (!) 97.4 F (36.3 C) (Oral)   Wt 217 lb (98.4 kg)   SpO2 96%   BMI 27.86 kg/m       Objective:   Physical Exam  Constitutional: He is oriented to person, place, and time. He appears well-developed and well-nourished. No distress.  Cardiovascular: Normal rate and intact distal pulses. An irregularly irregular rhythm present. Exam reveals no gallop and no friction rub.  No murmur heard. Pulmonary/Chest: Effort normal. No respiratory distress. He has  wheezes. He has no rales. He exhibits no tenderness.  Neurological: He is alert and oriented to person, place, and time.  Skin: Skin is warm and dry. No rash noted. He is not diaphoretic. No erythema. No pallor.  Psychiatric: He has a normal mood and affect. His behavior is normal. Judgment and thought content normal.  Nursing note and vitals reviewed.      Assessment & Plan:  1. Benign prostatic hyperplasia with incomplete bladder emptying - Will not pursue this further at this point as wife and patient do not want to at this time   2. Depression, recurrent (HCC) - Ambulatory referral to Psychiatry - Follow up  as needed - Go to the ER with any thoughts of SI   Shirline Frees, NP

## 2017-03-25 ENCOUNTER — Other Ambulatory Visit: Payer: Self-pay | Admitting: Adult Health

## 2017-03-25 DIAGNOSIS — Z760 Encounter for issue of repeat prescription: Secondary | ICD-10-CM

## 2017-03-26 DIAGNOSIS — M9903 Segmental and somatic dysfunction of lumbar region: Secondary | ICD-10-CM | POA: Diagnosis not present

## 2017-03-26 DIAGNOSIS — M9904 Segmental and somatic dysfunction of sacral region: Secondary | ICD-10-CM | POA: Diagnosis not present

## 2017-03-26 DIAGNOSIS — M9901 Segmental and somatic dysfunction of cervical region: Secondary | ICD-10-CM | POA: Diagnosis not present

## 2017-03-26 DIAGNOSIS — M9907 Segmental and somatic dysfunction of upper extremity: Secondary | ICD-10-CM | POA: Diagnosis not present

## 2017-03-26 NOTE — Telephone Encounter (Signed)
Rx sent in electronically by mistake.  Called the pharmacy to cancel.  Spoke to EldoradoLisa.  Rx was sent in for 1 year on 07/06/16.  Not due for a refill for several more months.

## 2017-03-28 DIAGNOSIS — M9904 Segmental and somatic dysfunction of sacral region: Secondary | ICD-10-CM | POA: Diagnosis not present

## 2017-03-28 DIAGNOSIS — M9903 Segmental and somatic dysfunction of lumbar region: Secondary | ICD-10-CM | POA: Diagnosis not present

## 2017-03-28 DIAGNOSIS — M9907 Segmental and somatic dysfunction of upper extremity: Secondary | ICD-10-CM | POA: Diagnosis not present

## 2017-03-28 DIAGNOSIS — M9901 Segmental and somatic dysfunction of cervical region: Secondary | ICD-10-CM | POA: Diagnosis not present

## 2017-03-29 ENCOUNTER — Other Ambulatory Visit: Payer: Self-pay | Admitting: Adult Health

## 2017-03-29 DIAGNOSIS — Z760 Encounter for issue of repeat prescription: Secondary | ICD-10-CM

## 2017-04-02 ENCOUNTER — Ambulatory Visit: Payer: Medicare Other | Admitting: Adult Health

## 2017-04-02 ENCOUNTER — Other Ambulatory Visit: Payer: Self-pay | Admitting: Family Medicine

## 2017-04-02 ENCOUNTER — Telehealth: Payer: Self-pay | Admitting: Adult Health

## 2017-04-02 DIAGNOSIS — M9901 Segmental and somatic dysfunction of cervical region: Secondary | ICD-10-CM | POA: Diagnosis not present

## 2017-04-02 DIAGNOSIS — Z760 Encounter for issue of repeat prescription: Secondary | ICD-10-CM

## 2017-04-02 DIAGNOSIS — M9907 Segmental and somatic dysfunction of upper extremity: Secondary | ICD-10-CM | POA: Diagnosis not present

## 2017-04-02 DIAGNOSIS — M9903 Segmental and somatic dysfunction of lumbar region: Secondary | ICD-10-CM | POA: Diagnosis not present

## 2017-04-02 DIAGNOSIS — M9904 Segmental and somatic dysfunction of sacral region: Secondary | ICD-10-CM | POA: Diagnosis not present

## 2017-04-02 MED ORDER — BUPROPION HCL ER (SR) 150 MG PO TB12: 150.0000 mg | ORAL_TABLET | Freq: Two times a day (BID) | ORAL | 1 refills | Status: AC

## 2017-04-02 NOTE — Telephone Encounter (Signed)
Copied from CRM [id number]. Topic: Quick Communication - Rx Refill/Question >> Apr 02, 2017 10:52 AM Anice PaganiniMunoz, Toniya Rozar I, NT wrote: Medication Lipitor 80 mg Wellbutrin 150 Mg    Has the patient contacted their pharmacy yes   (Agent: If no, request that the patient contact the pharmacy for the refill pt is out of his Med   Preferred Pharmacy (with phone number or street name [facility] [address] [phone]   Agent: Please be advised that RX refills may take up to 3 business days. We ask that you follow-up with your pharmacy.

## 2017-04-02 NOTE — Telephone Encounter (Signed)
FILLED ON 03/26/17 FOR 1 YEAR.  MESSAGE SENT TO THE PHARMACY TO CHECK FILE.

## 2017-04-02 NOTE — Telephone Encounter (Signed)
Wellbutrin sent to the pharmacy for 6 months.  Lipitor was sent in 03/26/17 for 1 year so request is too early.

## 2017-04-02 NOTE — Addendum Note (Signed)
Addended by: Raj JanusADKINS, Jeaneen Cala T on: 04/02/2017 11:22 AM   Modules accepted: Orders

## 2017-04-03 ENCOUNTER — Other Ambulatory Visit: Payer: Self-pay | Admitting: Adult Health

## 2017-04-03 ENCOUNTER — Other Ambulatory Visit: Payer: Self-pay

## 2017-04-03 DIAGNOSIS — Z760 Encounter for issue of repeat prescription: Secondary | ICD-10-CM

## 2017-04-03 DIAGNOSIS — M9901 Segmental and somatic dysfunction of cervical region: Secondary | ICD-10-CM | POA: Diagnosis not present

## 2017-04-03 DIAGNOSIS — M9903 Segmental and somatic dysfunction of lumbar region: Secondary | ICD-10-CM | POA: Diagnosis not present

## 2017-04-03 DIAGNOSIS — M9904 Segmental and somatic dysfunction of sacral region: Secondary | ICD-10-CM | POA: Diagnosis not present

## 2017-04-03 DIAGNOSIS — M9907 Segmental and somatic dysfunction of upper extremity: Secondary | ICD-10-CM | POA: Diagnosis not present

## 2017-04-03 DIAGNOSIS — K047 Periapical abscess without sinus: Secondary | ICD-10-CM | POA: Diagnosis not present

## 2017-04-03 MED ORDER — ATORVASTATIN CALCIUM 80 MG PO TABS: 80.0000 mg | ORAL_TABLET | Freq: Every day | ORAL | 3 refills | Status: AC

## 2017-04-03 NOTE — Telephone Encounter (Signed)
Pt [facility] b/c [facility] did not or [facility] not [facility], and the pt states they did not receive it, contact pt to advise, pt states he has ran out, contact to advise Pharmacy states they will send in another request

## 2017-04-03 NOTE — Telephone Encounter (Signed)
Rx sent to pharmacy as requested.  E-Prescribing Status: Receipt confirmed by pharmacy (04/03/2017 12:23 PM EST)

## 2017-04-04 ENCOUNTER — Telehealth: Payer: Self-pay | Admitting: Family Medicine

## 2017-04-04 ENCOUNTER — Ambulatory Visit (INDEPENDENT_AMBULATORY_CARE_PROVIDER_SITE_OTHER)
Admission: RE | Admit: 2017-04-04 | Discharge: 2017-04-04 | Disposition: A | Discharge status: Home / Self Care | Payer: Medicare Other | Source: Ambulatory Visit | Attending: Adult Health | Admitting: Adult Health

## 2017-04-04 DIAGNOSIS — R059 Cough, unspecified: Secondary | ICD-10-CM

## 2017-04-04 DIAGNOSIS — R0602 Shortness of breath: Secondary | ICD-10-CM

## 2017-04-04 DIAGNOSIS — R05 Cough: Secondary | ICD-10-CM

## 2017-04-04 NOTE — Telephone Encounter (Signed)
[name] (wife) notified that order for x-Zaman has been placed.  She will take pt in the morning.

## 2017-04-04 NOTE — Telephone Encounter (Signed)
Order placed.  Left a message for a return call.

## 2017-04-04 NOTE — Telephone Encounter (Signed)
Ok to order chest x Kozma for cough

## 2017-04-04 NOTE — Telephone Encounter (Signed)
Copied from CRM #[id number]. Topic: Inquiry >> Apr 04, 2017  9:30 AM Landry MellowFoltz, [name] J wrote: Reason for ZOX:WRUECRM:wife called - pt has cough and congestions and would like to have xray before appt tomorrow. She is also requesting full blood panel as she says that it is time for it.  She would like this to be done before appt tomorrow too. Cb# [phone]

## 2017-04-04 NOTE — Telephone Encounter (Signed)
He is being seen for a diabetic follow up tomorrow.. If he wants this to be a physical then we can accommodate this but he will have to be fasting, insurance will not allow me to get labs prior.   Also if tomorrow is going to be a physical, insurance will not cover any acute issues ( cough) and they will be charged another co pay for the acute issues.  If they want tomorrow to be for an acute issue and only that then it is ok to order x Levi

## 2017-04-04 NOTE — Telephone Encounter (Signed)
Spoke to SuperiorGlenna (wife).  Pt was placed on amoxicillin 875 mg bid for 10 days from tooth infection yesterday.  Seen at urgent care.    Pt has productive cough that is green and yellow.  Has had the cough for several weeks.  Worsening lately.  Felt feverish before going to see urgent care yesterday.  Not today.  Has hx of SOB.  Cannot tell if any worse.  Since appt for A1C is late tomorrow afternoon, requesting chest x-Magar before he comes.  Please advise.

## 2017-04-05 ENCOUNTER — Encounter: Payer: Self-pay | Admitting: Adult Health

## 2017-04-05 ENCOUNTER — Ambulatory Visit (INDEPENDENT_AMBULATORY_CARE_PROVIDER_SITE_OTHER): Payer: Medicare Other | Admitting: Adult Health

## 2017-04-05 VITALS — BP 138/78 | HR 77 | Temp 97.4°F | Resp 20 | Wt 221.4 lb

## 2017-04-05 DIAGNOSIS — E1165 Type 2 diabetes mellitus with hyperglycemia: Secondary | ICD-10-CM | POA: Diagnosis not present

## 2017-04-05 DIAGNOSIS — R05 Cough: Secondary | ICD-10-CM | POA: Diagnosis not present

## 2017-04-05 DIAGNOSIS — R059 Cough, unspecified: Secondary | ICD-10-CM

## 2017-04-05 MED ORDER — HYDROCODONE-HOMATROPINE 5-1.5 MG/5ML PO SYRP: 5.0000 mL | ORAL_SOLUTION | Freq: Three times a day (TID) | ORAL | 0 refills | Status: DC | PRN

## 2017-04-05 NOTE — Progress Notes (Signed)
Subjective:    Patient ID: Luis Yu, male    DOB: 1945-03-29, 72 y.o.   MRN: [id number]  HPI  72 year old male who  has a past medical history of Adjustment disorder with mixed emotional features, Atrial flutter (HCC), CAD (coronary artery disease), Depression, Diabetes mellitus type II, uncontrolled (HCC), Diabetic peripheral neuropathy (HCC), Erectile dysfunction, GERD (gastroesophageal reflux disease), Herpes zoster, Hyperlipidemia, Hypertension, Obesity, Osteoarthritis, Postherpetic neuralgia, and Sleep apnea.  Luis Yu presents to the office today for follow-up of diabetes.  He is currently maintained on glipizide 10 mg, metformin 1000 mg twice daily, and Basaglar 20 units QHS.  At home he reports "good" blood sugar readings with most being below 150 ,denies any hypoglycemic events.  He is also concerned about a semi-productive cough he has had for the last 2 weeks.  Denies any fevers, sinus pain or pressure, chest tightness, or wheezing.  He does often get short of breath at home but this is a chronic condition.  Chest x-Bur[place]on was ordered prior to his exam today for this reason and did not show any acute abnormality    Review of Systems See HPI   Past Medical History:  Diagnosis Date  . Adjustment disorder with mixed emotional features   . Atrial flutter (HCC)   . CAD (coronary artery disease)   . Depression   . Diabetes mellitus type II, uncontrolled (HCC)   . Diabetic peripheral neuropathy (HCC)   . Erectile dysfunction   . GERD (gastroesophageal reflux disease)   . Herpes zoster   . Hyperlipidemia   . Hypertension   . Obesity   . Osteoarthritis   . Postherpetic neuralgia   . Sleep apnea     Social History   Socioeconomic History  . Marital status: Married    Spouse name: Luis Yu  . Number of children: 1  . Years of education: 38  . Highest education level: Not on file  Social Needs  . Financial resource strain: Not on file  . Food insecurity - worry: Not on  file  . Food insecurity - inability: Not on file  . Transportation needs - medical: Not on file  . Transportation needs - non-medical: Not on file  Occupational History  . Occupation: Retired Publishing rights manager: RETIRED  Tobacco Use  . Smoking status: Never Smoker  . Smokeless tobacco: Never Used  Substance and Sexual Activity  . Alcohol use: Yes    Alcohol/week: 1.2 oz    Types: 2 Standard drinks or equivalent per week    Comment: twice a month.  . Drug use: No  . Sexual activity: Yes    Partners: Female  Other Topics Concern  . Not on file  Social History Narrative   Married Luis Yu)   Regular exercise: none   Caffeine use: cup of coffee daily    Retired Naval architect   Caffeine- twice daily          Past Surgical History:  Procedure Laterality Date  . CARDIOVERSION N/A 11/10/2015   Procedure: CARDIOVERSION;  Surgeon: Laurey Morale, MD;  Location: [facility] ENDOSCOPY;  Service: Cardiovascular;  Laterality: N/A;  . CARDIOVERSION N/A 02/21/2016   Procedure: CARDIOVERSION;  Surgeon: Lewayne Bunting, MD;  Location: [facility] ENDOSCOPY;  Service: Cardiovascular;  Laterality: N/A;  . CORONARY ARTERY BYPASS GRAFT  1996   eith a LIMA to the LAD, saphenous vein graft to the acute marginal, saphenous vein graft to the PDA and saphenous vein graft to the  circumflex.  [name] KNEE ARTHROSCOPY  05/2004   right knee  . REPLACEMENT TOTAL KNEE BILATERAL    . SHOULDER SURGERY      Family History  Problem Relation Age of Onset  . Coronary artery disease Mother        deceased at 28  . Coronary artery disease Father        died age 60  . Stroke Unknown        half brother  . Stroke Brother     No Known Allergies  Current Outpatient Medications on File Prior to Visit  Medication Sig Dispense Refill  . acetaminophen (TYLENOL) 500 MG tablet Take 2 tablets (1,000 mg total) by mouth every 6 (six) hours as needed. (Patient taking differently: Take 1,000 mg by mouth every 6 (six) hours as needed  for mild pain. ) 30 tablet 0  . amoxicillin-clavulanate (AUGMENTIN) 875-125 MG tablet Take 1 tablet by mouth 2 (two) times daily.    [name] apixaban (ELIQUIS) 5 MG TABS tablet Take 1 tablet (5 mg total) by mouth 2 (two) times daily. 180 tablet 3  . atorvastatin (LIPITOR) 80 MG tablet Take 1 tablet (80 mg total) by mouth daily. 90 tablet 3  . buPROPion (WELLBUTRIN SR) 150 MG 12 hr tablet Take 1 tablet (150 mg total) by mouth 2 (two) times daily. 180 tablet 1  . clopidogrel (PLAVIX) 75 MG tablet Take 1 tablet (75 mg total) daily by mouth. 90 tablet 3  . glipiZIDE (GLUCOTROL) 10 MG tablet Take 10 mg by mouth daily.    [name] glucose blood (ACCU-CHEK ACTIVE STRIPS) test strip Use as instructed 100 each 12  . Insulin Glargine (BASAGLAR KWIKPEN) 100 UNIT/ML SOPN Inject 0.25 mLs (25 Units total) into the skin at bedtime. (Patient taking differently: Inject 20 Units into the skin at bedtime. ) 3 pen 11  . Insulin Pen Needle (PEN NEEDLES 31GX5/16") 31G X 8 MM MISC Use to inject insulin 5 times a day. 100 each 11  . isosorbide mononitrate (IMDUR) 30 MG 24 hr tablet Take 1 tablet (30 mg total) by mouth daily. 90 tablet 3  . Lancets (ACCU-CHEK MULTICLIX) lancets Use up to 5 times a day to check blood sugar. 100 each 5  . losartan-hydrochlorothiazide (HYZAAR) 50-12.5 MG tablet TAKE 1 TABLET BY MOUTH EVERY DAY 90 tablet 1  . metFORMIN (GLUCOPHAGE) 1000 MG tablet TAKE 1 TABLET (1,000 MG TOTAL) BY MOUTH 2 (TWO) TIMES DAILY WITH A MEAL. 180 tablet 1  . mirtazapine (REMERON) 30 MG tablet TAKE 1 TABLET (30 MG TOTAL) BY MOUTH AT BEDTIME. 90 tablet 1  . Nebivolol HCl (BYSTOLIC) 20 MG TABS Take 1 tablet by mouth daily.    [name] omeprazole (PRILOSEC) 20 MG capsule TAKE ONE CAPSULE TWICE A DAY BEFORE A MEAL 180 capsule 0  . thiamine 100 MG tablet Take 1 tablet (100 mg total) by mouth daily. 90 tablet 3  . vitamin B-12 (CYANOCOBALAMIN) 1000 MCG tablet Take 2 tablets (2,000 mcg total) by mouth daily. 180 tablet 3  . metoprolol tartrate  (LOPRESSOR) 25 MG tablet Take 1 tablet (25 mg total) 2 (two) times daily by mouth. (Patient not taking: Reported on 04/05/2017) 180 tablet 3   No current facility-administered medications on file prior to visit.     BP 138/78 (BP Location: Right Arm, Patient Position: Sitting, Cuff Size: Normal)   Pulse 77   Temp (!) 97.4 F (36.3 C) (Oral)   Resp 20   Wt 221 lb 6.4 oz (  100.4 kg)   SpO2 98%   BMI 28.43 kg/m       Objective:   Physical Exam  Constitutional: He is oriented to person, place, and time. He appears well-developed and well-nourished. No distress.  Cardiovascular: Normal rate, regular rhythm, normal heart sounds and intact distal pulses. Exam reveals no gallop and no friction rub.  No murmur heard. Pulmonary/Chest: Effort normal and breath sounds normal. No respiratory distress. He has no wheezes. He has no rales. He exhibits no tenderness.  Neurological: He is alert and oriented to person, place, and time.  Skin: Skin is warm and dry. No rash noted. He is not diaphoretic. No erythema. No pallor.  Psychiatric: He has a normal mood and affect. His behavior is normal. Judgment and thought content normal.  Nursing note and vitals reviewed.     Assessment & Plan:  1. Uncontrolled type 2 diabetes mellitus with hyperglycemia (HCC) - Consider increase Basaglar dose.  - Sample of Basaglar given  - Basic metabolic panel - CBC with Differential/Platelet - Hemoglobin A1c  2. Cough - Likely viral. Will give prescription for Hycodan  - HYDROcodone-homatropine (HYCODAN) 5-1.5 MG/5ML syrup; Take 5 mLs by mouth every 8 (eight) hours as needed for cough.  Dispense: 120 mL; Refill: 0   Shirline Freesory Skipper Dacosta, NP

## 2017-04-05 NOTE — Telephone Encounter (Signed)
SENT IN ON 04/03/17 FOR 1 YEAR.  MESSAGE SENT TO THE PHARMACY TO CHECK FILE. 

## 2017-04-06 LAB — CBC WITH DIFFERENTIAL/PLATELET
Basophils Absolute: 26 cells/uL (ref 0–200)
Basophils Relative: 0.3 %
Eosinophils Absolute: 104 cells/uL (ref 15–500)
Eosinophils Relative: 1.2 %
HCT: 32.3 % — ABNORMAL LOW (ref 38.5–50.0)
Hemoglobin: 10.5 g/dL — ABNORMAL LOW (ref 13.2–17.1)
Lymphs Abs: 914 cells/uL (ref 850–3900)
MCH: 30.5 pg (ref 27.0–33.0)
MCHC: 32.5 g/dL (ref 32.0–36.0)
MCV: 93.9 fL (ref 80.0–100.0)
MPV: 11.9 fL (ref 7.5–12.5)
Monocytes Relative: 9 %
Neutro Abs: 6873 cells/uL (ref 1500–7800)
Neutrophils Relative %: 79 %
Platelets: 164 10*3/uL (ref 140–400)
RBC: 3.44 10*6/uL — ABNORMAL LOW (ref 4.20–5.80)
RDW: 15.7 % — ABNORMAL HIGH (ref 11.0–15.0)
Total Lymphocyte: 10.5 %
WBC mixed population: 783 cells/uL (ref 200–950)
WBC: 8.7 10*3/uL (ref 3.8–10.8)

## 2017-04-06 LAB — HEMOGLOBIN A1C
Hgb A1c MFr Bld: 6 % of total Hgb — ABNORMAL HIGH (ref <5.7)
Mean Plasma Glucose: 126 (calc)
eAG (mmol/L): 7 (calc)

## 2017-04-06 LAB — BASIC METABOLIC PANEL
BUN/Creatinine Ratio: 24 (calc) — ABNORMAL HIGH (ref 6–22)
BUN: 28 mg/dL — ABNORMAL HIGH (ref 7–25)
CO2: 24 mmol/L (ref 20–32)
Calcium: 9.1 mg/dL (ref 8.6–10.3)
Chloride: 106 mmol/L (ref 98–110)
Creat: 1.19 mg/dL — ABNORMAL HIGH (ref 0.70–1.18)
Glucose, Bld: 77 mg/dL (ref 65–99)
Potassium: 4.2 mmol/L (ref 3.5–5.3)
Sodium: 142 mmol/L (ref 135–146)

## 2017-04-09 DIAGNOSIS — H4312 Vitreous hemorrhage, left eye: Secondary | ICD-10-CM | POA: Diagnosis not present

## 2017-04-09 DIAGNOSIS — Z961 Presence of intraocular lens: Secondary | ICD-10-CM | POA: Diagnosis not present

## 2017-04-09 DIAGNOSIS — H43393 Other vitreous opacities, bilateral: Secondary | ICD-10-CM | POA: Diagnosis not present

## 2017-04-09 DIAGNOSIS — E113493 Type 2 diabetes mellitus with severe nonproliferative diabetic retinopathy without macular edema, bilateral: Secondary | ICD-10-CM | POA: Diagnosis not present

## 2017-04-09 DIAGNOSIS — H04123 Dry eye syndrome of bilateral lacrimal glands: Secondary | ICD-10-CM | POA: Diagnosis not present

## 2017-04-12 ENCOUNTER — Telehealth: Payer: Self-pay | Admitting: Cardiology

## 2017-04-12 DIAGNOSIS — H353131 Nonexudative age-related macular degeneration, bilateral, early dry stage: Secondary | ICD-10-CM | POA: Diagnosis not present

## 2017-04-12 DIAGNOSIS — E113593 Type 2 diabetes mellitus with proliferative diabetic retinopathy without macular edema, bilateral: Secondary | ICD-10-CM | POA: Diagnosis not present

## 2017-04-12 DIAGNOSIS — H3582 Retinal ischemia: Secondary | ICD-10-CM | POA: Diagnosis not present

## 2017-04-12 DIAGNOSIS — H4312 Vitreous hemorrhage, left eye: Secondary | ICD-10-CM | POA: Diagnosis not present

## 2017-04-12 NOTE — Telephone Encounter (Signed)
Spoke with pt wife, okay per dr Jens Somcrenshaw to stop plavix but to continue eliquis.

## 2017-04-12 NOTE — Telephone Encounter (Signed)
New Message:    Pt went to his eye today and he is bleeding worse behind his Retina-She wants to know if pt can stop his Plavix,because of this bleeding.

## 2017-04-17 ENCOUNTER — Telehealth: Payer: Self-pay | Admitting: Adult Health

## 2017-04-17 NOTE — Telephone Encounter (Signed)
Do not need to increase iron supplement

## 2017-04-17 NOTE — Telephone Encounter (Signed)
Copied from CRM [phone]. Topic: General - Other >> Apr 17, 2017  8:31 AM Cecelia ByarsGreen, Temeka L, RMA wrote: Reason for CRM: Patient is requesting a call back from [name]'s CMA concerning iron levels

## 2017-04-17 NOTE — Telephone Encounter (Signed)
Patients wife called wanting to know if they could up the dose on the iron supplement due to retinal hemorrhaging.

## 2017-04-18 DIAGNOSIS — M9904 Segmental and somatic dysfunction of sacral region: Secondary | ICD-10-CM | POA: Diagnosis not present

## 2017-04-18 DIAGNOSIS — M9903 Segmental and somatic dysfunction of lumbar region: Secondary | ICD-10-CM | POA: Diagnosis not present

## 2017-04-18 DIAGNOSIS — M9901 Segmental and somatic dysfunction of cervical region: Secondary | ICD-10-CM | POA: Diagnosis not present

## 2017-04-18 DIAGNOSIS — M9907 Segmental and somatic dysfunction of upper extremity: Secondary | ICD-10-CM | POA: Diagnosis not present

## 2017-04-18 NOTE — Telephone Encounter (Signed)
Patient wife was advise not to increase iron supplement. She understood verbalized order.

## 2017-04-19 ENCOUNTER — Emergency Department (INDEPENDENT_AMBULATORY_CARE_PROVIDER_SITE_OTHER)
Admission: EM | Admit: 2017-04-19 | Discharge: 2017-04-19 | Disposition: A | Discharge status: Home / Self Care | Payer: Medicare Other | Source: Home / Self Care | Attending: Family Medicine | Admitting: Family Medicine

## 2017-04-19 ENCOUNTER — Emergency Department (INDEPENDENT_AMBULATORY_CARE_PROVIDER_SITE_OTHER): Payer: Medicare Other

## 2017-04-19 ENCOUNTER — Other Ambulatory Visit: Payer: Self-pay

## 2017-04-19 ENCOUNTER — Encounter: Payer: Self-pay | Admitting: Emergency Medicine

## 2017-04-19 DIAGNOSIS — R053 Chronic cough: Secondary | ICD-10-CM

## 2017-04-19 DIAGNOSIS — R05 Cough: Secondary | ICD-10-CM | POA: Diagnosis not present

## 2017-04-19 NOTE — Discharge Instructions (Signed)
Try plain guaifenesin 400mg  during the day for cough, and Mucinex DM at bedtime.

## 2017-04-19 NOTE — ED Triage Notes (Signed)
Cough x 2 weeks, was seen by PCP 2 weeks ago, negative x-Scogin was given a cough syrup but wife only gave it to him twice because it made his SOB worse.

## 2017-04-19 NOTE — ED Provider Notes (Signed)
[facility]    CSN: [id number] Arrival date & time: 04/19/17  1732     History   Chief Complaint Chief Complaint  Patient presents with  . Cough    HPI Luis Yu is a 72 y.o. male.   Patient has a history of atrial flutter (on Eliquis), CAD, and type 2 diabetes.  He complains of a persistent non-productive cough for over two weeks.  No pleuritic pain and no fevers, chills, and sweats.  A chest X-Hendon done 04/04/17 showed heart size upper normal but no acute disease.  He was prescribed a cough syrup but he states that it made his dyspnea worse.  Two weeks ago he was prescribed amoxicillin for a dental abscess.  He complains of increasing shortness of breath with activity, and PND.  Note recent increased anemia (Hgb 10.5 on 04/05/17, and Hgb 12.7 on 12/29/16).  He reports that he started oral iron therapy four days ago.   The history is provided by the patient and the spouse.    Past Medical History:  Diagnosis Date  . Adjustment disorder with mixed emotional features   . Atrial flutter (HCC)   . CAD (coronary artery disease)   . Depression   . Diabetes mellitus type II, uncontrolled (HCC)   . Diabetic peripheral neuropathy (HCC)   . Erectile dysfunction   . GERD (gastroesophageal reflux disease)   . Herpes zoster   . Hyperlipidemia   . Hypertension   . Obesity   . Osteoarthritis   . Postherpetic neuralgia   . Sleep apnea     Patient Active Problem List   Diagnosis Date Noted  . Sleep apnea   . Postherpetic neuralgia   . Osteoarthritis   . Obesity   . Hypertension   . GERD (gastroesophageal reflux disease)   . Erectile dysfunction   . Diabetic peripheral neuropathy (HCC)   . Diabetes mellitus type II, uncontrolled (HCC)   . Depression   . CAD (coronary artery disease)   . Atrial flutter (HCC)   . Adjustment disorder with mixed emotional features   . Frontal lobe and executive function deficit following cerebral infarction 07/02/2016  . Neurologic  gait disorder   . Coronary artery disease involving coronary bypass graft of native heart without angina pectoris   . Acute kidney injury (HCC)   . Nausea & vomiting 06/28/2016  . Ischemic stroke of frontal lobe (HCC) 06/28/2016  . Thrombocytopenia (HCC) 06/28/2016  . Renal failure (ARF), acute on chronic (HCC) 06/28/2016  . Stroke (cerebrum) (HCC) 06/28/2016  . Acute CVA (cerebrovascular accident) (HCC) 06/28/2016  . Insomnia 02/02/2016  . Atrial fibrillation (HCC) 10/27/2015  . Uncoordinated movements 02/24/2014  . Unspecified hereditary and idiopathic peripheral neuropathy 02/24/2013  . Low back pain 02/24/2013  . Abnormality of gait 02/09/2013  . Memory loss, short term 12/05/2011  . LUMBAR STRAIN, ACUTE 05/05/2010  . OSA (obstructive sleep apnea) 09/14/2009  . BPH (benign prostatic hyperplasia) 07/27/2009  . CARDIOVASCULAR STUDIES, ABNORMAL 07/27/2009  . CHEST PAIN 06/08/2009  . OSTEOARTHRITIS 05/30/2009  . HEEL PAIN, LEFT 05/24/2009  . Dyspnea on exertion 05/24/2009  . ADJUSTMENT DISORDER WITH MIXED FEATURES 08/25/2008  . Diabetes type 2, uncontrolled (HCC) 02/24/2008  . ERECTILE DYSFUNCTION 02/10/2007  . Hyperlipidemia 10/09/2006  . OBESITY 10/09/2006  . Essential hypertension 10/09/2006  . Coronary atherosclerosis 10/09/2006  . GERD 10/09/2006  . DM (diabetes mellitus) type II uncontrolled with retinopathy 06/10/2006    Past Surgical History:  Procedure Laterality Date  .  CARDIOVERSION N/A 11/10/2015   Procedure: CARDIOVERSION;  Surgeon: Laurey Moralealton S McLean, MD;  [facility];  Service: Cardiovascular;  Laterality: N/A;  . CARDIOVERSION N/A 02/21/2016   Procedure: CARDIOVERSION;  Surgeon: Lewayne BuntingBrian S Crenshaw, MD;  [facility];  Service: Cardiovascular;  Laterality: N/A;  . CORONARY ARTERY BYPASS GRAFT  1996   eith a LIMA to the LAD, saphenous vein graft to the acute marginal, saphenous vein graft to the PDA and saphenous vein graft to the circumflex.  [name].  KNEE ARTHROSCOPY  05/2004   right knee  . REPLACEMENT TOTAL KNEE BILATERAL    . SHOULDER SURGERY         Home Medications    Prior to Admission medications   Medication Sig Start Date End Date Taking? Authorizing Provider  acetaminophen (TYLENOL) 500 MG tablet Take 2 tablets (1,000 mg total) by mouth every 6 (six) hours as needed. Patient taking differently: Take 1,000 mg by mouth every 6 (six) hours as needed for mild pain.  05/24/16   Arby BarrettePfeiffer, Marcy, MD  apixaban (ELIQUIS) 5 MG TABS tablet Take 1 tablet (5 mg total) by mouth 2 (two) times daily. 08/09/16   Nafziger, Kandee Keenory, NP  atorvastatin (LIPITOR) 80 MG tablet Take 1 tablet (80 mg total) by mouth daily. 04/03/17   Nafziger, Kandee Keenory, NP  buPROPion (WELLBUTRIN SR) 150 MG 12 hr tablet Take 1 tablet (150 mg total) by mouth 2 (two) times daily. 04/02/17   Nafziger, Kandee Keenory, NP  clopidogrel (PLAVIX) 75 MG tablet Take 1 tablet (75 mg total) daily by mouth. 01/28/17   Crenshaw, Madolyn FriezeBrian S, MD  glipiZIDE (GLUCOTROL) 10 MG tablet Take 10 mg by mouth daily. 07/14/16   [provider]  glucose blood (ACCU-CHEK ACTIVE STRIPS) test strip Use as instructed 03/29/16   Nafziger, Kandee Keenory, NP  HYDROcodone-homatropine (HYCODAN) 5-1.5 MG/5ML syrup Take 5 mLs by mouth every 8 (eight) hours as needed for cough. 04/05/17   Nafziger, Kandee Keenory, NP  Insulin Glargine (BASAGLAR KWIKPEN) 100 UNIT/ML SOPN Inject 0.25 mLs (25 Units total) into the skin at bedtime. Patient taking differently: Inject 20 Units into the skin at bedtime.  07/06/16   Angiulli, Mcarthur Rossettianiel J, PA-C  Insulin Pen Needle (PEN NEEDLES 31GX5/16") 31G X 8 MM MISC Use to inject insulin 5 times a day. 07/06/16   Angiulli, Mcarthur Rossettianiel J, PA-C  isosorbide mononitrate (IMDUR) 30 MG 24 hr tablet Take 1 tablet (30 mg total) by mouth daily. 08/09/16   Nafziger, Kandee Keenory, NP  Lancets (ACCU-CHEK MULTICLIX) lancets Use up to 5 times a day to check blood sugar. 07/06/16   Angiulli, Mcarthur Rossettianiel J, PA-C  losartan-hydrochlorothiazide (HYZAAR) 50-12.5  MG tablet TAKE 1 TABLET BY MOUTH EVERY DAY 11/20/16   Nafziger, Kandee Keenory, NP  metFORMIN (GLUCOPHAGE) 1000 MG tablet TAKE 1 TABLET (1,000 MG TOTAL) BY MOUTH 2 (TWO) TIMES DAILY WITH A MEAL. 12/12/16   Nafziger, Kandee Keenory, NP  metoprolol tartrate (LOPRESSOR) 25 MG tablet Take 1 tablet (25 mg total) 2 (two) times daily by mouth. Patient not taking: Reported on 04/05/2017 01/28/17 04/28/17  Lewayne Buntingrenshaw, Brian S, MD  mirtazapine (REMERON) 30 MG tablet TAKE 1 TABLET (30 MG TOTAL) BY MOUTH AT BEDTIME. 12/19/16   Nafziger, Kandee Keenory, NP  Nebivolol HCl (BYSTOLIC) 20 MG TABS Take 1 tablet by mouth daily.    [provider]  omeprazole (PRILOSEC) 20 MG capsule TAKE ONE CAPSULE TWICE A DAY BEFORE A MEAL 10/05/16   Nafziger, Kandee Keenory, NP  thiamine 100 MG tablet Take 1 tablet (100 mg total) by mouth  daily. 08/09/16   Shirline Frees, NP  vitamin B-12 (CYANOCOBALAMIN) 1000 MCG tablet Take 2 tablets (2,000 mcg total) by mouth daily. 08/09/16   Shirline Frees, NP    Family History Family History  Problem Relation Age of Onset  . Coronary artery disease Mother        deceased at 97  . Coronary artery disease Father        died age 32  . Stroke Unknown        half brother  . Stroke Brother     Social History Social History   Tobacco Use  . Smoking status: Never Smoker  . Smokeless tobacco: Never Used  Substance Use Topics  . Alcohol use: Yes    Alcohol/week: 1.2 oz    Types: 2 Standard drinks or equivalent per week    Comment: twice a month.  . Drug use: No     Allergies   Patient has no known allergies.   Review of Systems Review of Systems No sore throat + cough No pleuritic pain No chest pain ? wheezing No nasal congestion No post-nasal drainage No sinus pain/pressure No itchy/red eyes No earache No hemoptysis + SOB with activity + PND No fever/chills No nausea No vomiting No abdominal pain No diarrhea No urinary symptoms No skin rash + fatigue No myalgias No headache   Physical  Exam Triage Vital Signs ED Triage Vitals [04/19/17 1800]  Enc Vitals Group     BP 106/65     Pulse Rate 80     Resp      Temp (!) 97.5 F (36.4 C)     Temp Source Oral     SpO2 97 %     Weight 221 lb (100.2 kg)     Height 6\' 2"  (1.88 m)     Head Circumference      Peak Flow      Pain Score 3     Pain Loc      Pain Edu?      Excl. in GC?    No data found.  Updated Vital Signs BP 106/65 (BP Location: Right Arm)   Pulse 80   Temp (!) 97.5 F (36.4 C) (Oral)   Ht 6\' 2"  (1.88 m)   Wt 221 lb (100.2 kg)   SpO2 97%   BMI 28.37 kg/m   Visual Acuity Right Eye Distance:   Left Eye Distance:   Bilateral Distance:    Right Eye Near:   Left Eye Near:    Bilateral Near:     Physical Exam Nursing notes and Vital Signs reviewed. Appearance:  Patient appears stated age, and in no acute distress.    Eyes:  Pupils are equal, round, and reactive to light and accomodation.  Extraocular movement is intact.  Conjunctivae are not inflamed   Pharynx:  Normal; moist mucous membranes  Neck:  Supple.  No adenopathy Lungs:  Clear to auscultation.  Breath sounds are equal.  Moving air well. Heart:   Irregular rhythm Abdomen:  Nontender without masses or hepatosplenomegaly.  Bowel sounds are present.  No CVA or flank tenderness.  Extremities:   2 to 3+ pitting edema lower legs ankles/feet Skin:  No rash present.      UC Treatments / Results  Labs (all labs ordered are listed, but only abnormal results are displayed) Labs Reviewed  BRAIN NATRIURETIC PEPTIDE  TSH    EKG  EKG Interpretation None       Radiology Dg Chest 2  View  Result Date: 04/19/2017 CLINICAL DATA:  Persistent cough for 2 weeks. EXAM: CHEST  2 VIEW COMPARISON:  04/04/2017 FINDINGS: There is mild bilateral interstitial prominence. There is no focal parenchymal opacity. There is no pleural effusion or pneumothorax. The heart and mediastinal contours are unremarkable. There is evidence of prior CABG. The osseous  structures are unremarkable. IMPRESSION: No active cardiopulmonary disease. Electronically Signed   By: Elige Ko   On: 04/19/2017 19:25    Procedures Procedures (including critical care time)  Medications Ordered in UC Medications - No data to display   Initial Impression / Assessment and Plan / UC Course  I have reviewed the triage vital signs and the nursing notes.  Pertinent labs & imaging results that were available during my care of the patient were reviewed by me and considered in my medical decision making (see chart for details).    Suspect a component of CHF contributing to patient's dyspnea. Check BNP (if elevated may benefit from Lasix for 3 to 4 days).  Check TSH. Try plain guaifenesin 400mg  during the day for cough, and Mucinex DM at bedtime. Followup with cardiologist for further management.    Final Clinical Impressions(s) / UC Diagnoses   Final diagnoses:  Persistent cough for 3 weeks or longer    ED Discharge Orders    None          Lattie Haw, MD 04/22/17 2155

## 2017-04-20 ENCOUNTER — Telehealth: Payer: Self-pay | Admitting: Emergency Medicine

## 2017-04-20 LAB — BRAIN NATRIURETIC PEPTIDE: Brain Natriuretic Peptide: 495 pg/mL — ABNORMAL HIGH (ref <100)

## 2017-04-20 LAB — TSH: TSH: 2.71 mIU/L (ref 0.40–4.50)

## 2017-04-20 NOTE — Telephone Encounter (Signed)
Call to patient with lab results and per Dr.Beese to have patient take Lasix 20 mg po qd x 4; call and make appt with Dr.Crenshaw Monday. Spoke with wife who understands ; they have supply of Lasix.

## 2017-04-21 NOTE — Telephone Encounter (Signed)
Patient's wife is asking about fluid intake. Per Dr. Cathren HarshBeese no need to change intake. Do not drink excessive amounts.

## 2017-04-22 ENCOUNTER — Telehealth: Payer: Self-pay | Admitting: Cardiology

## 2017-04-22 ENCOUNTER — Ambulatory Visit (HOSPITAL_COMMUNITY): Payer: Medicare Other | Admitting: Psychiatry

## 2017-04-22 DIAGNOSIS — Z79899 Other long term (current) drug therapy: Secondary | ICD-10-CM

## 2017-04-22 DIAGNOSIS — I10 Essential (primary) hypertension: Secondary | ICD-10-CM

## 2017-04-22 MED ORDER — FUROSEMIDE 40 MG PO TABS: 40.0000 mg | ORAL_TABLET | Freq: Every day | ORAL | 1 refills | Status: DC

## 2017-04-22 NOTE — Progress Notes (Signed)
HPI: FU CAD and atrial flutter. Abdominal ultrasound in 2003 showed no aneurysm.Patient has had previous PCI of the saphenous vein graft to the PDA. Cardiac cath in Dec of 2011 revealed EF of 50. Continued patency internal mammary to the LAD. Occluded saphenous vein graft to the recent percutaneous intervention to the distal circumflex/PDA territory. Progressive disease in the saphenous vein graft to the OM. Patient evaluated by CVTS. Distal vessels felt to be poor targets. CABG could be considered in the future if PCI options were exhausted. Patient had PCI of the saphenous vein graft to the obtuse marginal. Nuclear study August 2017 showed ejection fraction 48% and no ischemia or infarction. Patient had cardioversion November 10 2015 and December 52017. Patient seen by Dr. Johney Frame for consideration of ablation and rate control recommended. Tikosyn could be considered.Patient had CVA April 2018. Echocardiogram repeated April 2018 and showed normal LV systolic function, moderate left ventricular hypertrophy, mild biatrial enlargement, mild mitral regurgitation and moderately elevated pulmonary pressure. Carotid Dopplers April 2018 showed 1-39% bilateral stenosis.  Chest x-Fulghum February 1 showed no active cardiopulmonary disease.  Since last seen,  patient notes increased dyspnea on exertion.  There is no orthopnea or PND.  He has noticed increased bilateral lower extremity edema.  He also has pain in his left wrist/thumb.  No fevers or chills.  Current Outpatient Medications  Medication Sig Dispense Refill  . acetaminophen (TYLENOL) 500 MG tablet Take 2 tablets (1,000 mg total) by mouth every 6 (six) hours as needed. (Patient taking differently: Take 1,000 mg by mouth every 6 (six) hours as needed for mild pain. ) 30 tablet 0  . apixaban (ELIQUIS) 5 MG TABS tablet Take 1 tablet (5 mg total) by mouth 2 (two) times daily. 180 tablet 3  . atorvastatin (LIPITOR) 80 MG tablet Take 1 tablet (80 mg total)  by mouth daily. 90 tablet 3  . buPROPion (WELLBUTRIN SR) 150 MG 12 hr tablet Take 1 tablet (150 mg total) by mouth 2 (two) times daily. 180 tablet 1  . furosemide (LASIX) 40 MG tablet Take 1 tablet (40 mg total) by mouth daily. 30 tablet 1  . glipiZIDE (GLUCOTROL) 10 MG tablet Take 10 mg by mouth daily.    [name] glucose blood (ACCU-CHEK ACTIVE STRIPS) test strip Use as instructed 100 each 12  . HYDROcodone-homatropine (HYCODAN) 5-1.5 MG/5ML syrup Take 5 mLs by mouth every 8 (eight) hours as needed for cough. 120 mL 0  . Insulin Glargine (BASAGLAR KWIKPEN) 100 UNIT/ML SOPN Inject 0.25 mLs (25 Units total) into the skin at bedtime. (Patient taking differently: Inject 20 Units into the skin at bedtime. ) 3 pen 11  . Insulin Pen Needle (PEN NEEDLES 31GX5/16") 31G X 8 MM MISC Use to inject insulin 5 times a day. 100 each 11  . isosorbide mononitrate (IMDUR) 30 MG 24 hr tablet Take 1 tablet (30 mg total) by mouth daily. 90 tablet 3  . Lancets (ACCU-CHEK MULTICLIX) lancets Use up to 5 times a day to check blood sugar. 100 each 5  . losartan-hydrochlorothiazide (HYZAAR) 50-12.5 MG tablet TAKE 1 TABLET BY MOUTH EVERY DAY 90 tablet 1  . metFORMIN (GLUCOPHAGE) 1000 MG tablet TAKE 1 TABLET (1,000 MG TOTAL) BY MOUTH 2 (TWO) TIMES DAILY WITH A MEAL. 180 tablet 1  . mirtazapine (REMERON) 30 MG tablet TAKE 1 TABLET (30 MG TOTAL) BY MOUTH AT BEDTIME. 90 tablet 1  . Nebivolol HCl (BYSTOLIC) 20 MG TABS Take 1 tablet by mouth daily.    [name]  omeprazole (PRILOSEC) 20 MG capsule TAKE ONE CAPSULE TWICE A DAY BEFORE A MEAL 180 capsule 0  . thiamine 100 MG tablet Take 1 tablet (100 mg total) by mouth daily. 90 tablet 3  . vitamin B-12 (CYANOCOBALAMIN) 1000 MCG tablet Take 2 tablets (2,000 mcg total) by mouth daily. 180 tablet 3  . clopidogrel (PLAVIX) 75 MG tablet Take 1 tablet (75 mg total) daily by mouth. (Patient not taking: Reported on 04/24/2017) 90 tablet 3   No current facility-administered medications for this visit.       Past Medical History:  Diagnosis Date  . Adjustment disorder with mixed emotional features   . Atrial flutter (HCC)   . CAD (coronary artery disease)   . Depression   . Diabetes mellitus type II, uncontrolled (HCC)   . Diabetic peripheral neuropathy (HCC)   . Erectile dysfunction   . GERD (gastroesophageal reflux disease)   . Herpes zoster   . Hyperlipidemia   . Hypertension   . Obesity   . Osteoarthritis   . Postherpetic neuralgia   . Sleep apnea     Past Surgical History:  Procedure Laterality Date  . CARDIOVERSION N/A 11/10/2015   Procedure: CARDIOVERSION;  Surgeon: Laurey Moralealton S McLean, MD;  [facility];  Service: Cardiovascular;  Laterality: N/A;  . CARDIOVERSION N/A 02/21/2016   Procedure: CARDIOVERSION;  Surgeon: Lewayne BuntingBrian S Theadora Noyes, MD;  [facility];  Service: Cardiovascular;  Laterality: N/A;  . CORONARY ARTERY BYPASS GRAFT  1996   eith a LIMA to the LAD, saphenous vein graft to the acute marginal, saphenous vein graft to the PDA and saphenous vein graft to the circumflex.  [name]. KNEE ARTHROSCOPY  05/2004   right knee  . REPLACEMENT TOTAL KNEE BILATERAL    . SHOULDER SURGERY      Social History   Socioeconomic History  . Marital status: Married    Spouse name: [name]  . Number of children: 1  . Years of education: 2613  . Highest education level: Not on file  Social Needs  . Financial resource strain: Not on file  . Food insecurity - worry: Not on file  . Food insecurity - inability: Not on file  . Transportation needs - medical: Not on file  . Transportation needs - non-medical: Not on file  Occupational History  . Occupation: Retired Publishing rights managertruck driver    Employer: RETIRED  Tobacco Use  . Smoking status: Never Smoker  . Smokeless tobacco: Never Used  Substance and Sexual Activity  . Alcohol use: Yes    Alcohol/week: 1.2 oz    Types: 2 Standard drinks or equivalent per week    Comment: twice a month.  . Drug use: No  . Sexual activity: Yes     Partners: Female  Other Topics Concern  . Not on file  Social History Narrative   Married [name]([name])   Regular exercise: none   Caffeine use: cup of coffee daily    Retired Naval architecttruck driver   Caffeine- twice daily          Family History  Problem Relation Age of Onset  . Coronary artery disease Mother        deceased at 7875  . Coronary artery disease Father        died age 73  . Stroke Unknown        half brother  . Stroke Brother     ROS: no fevers or chills, productive cough, hemoptysis, dysphasia, odynophagia, melena, hematochezia, dysuria, hematuria, rash, seizure activity,  orthopnea, PND, claudication. Remaining systems are negative.  Physical Exam: Well-developed well-nourished in no acute distress.  Skin is warm and dry.  HEENT is normal.  Neck is supple.  Chest is clear to auscultation with normal expansion.  Cardiovascular exam is irregular Abdominal exam nontender or distended. No masses palpated. Extremities show 1+ edema.  There is erythema and tenderness to palpation in right thumb area. neuro grossly intact  ECG-atrial fibrillation at a rate of 71.  Left bundle branch block.  Personally reviewed  A/P  1 atrial fibrillation/flutter-we will continue with beta-blocker for rate control.  Continue apixaban.  2 coronary artery disease-continue statin.  Plavix discontinued recently because of bleeding in his eye.   3 hypertension-blood pressure is mildly elevated.  We are increasing his diuretic which will hopefully improve his blood pressure.  4 hyperlipidemia-continue statin.  5 dyspnea-patient has chronic dyspnea but worse at present.  He is also volume overloaded.  Lasix was recently initiated.  I will increase to 60 mg daily for 3 days then 40 mg daily thereafter.  Check potassium and renal function today and in 1 week.  Patient instructed on low-sodium diet and fluid restriction.  6 anemia-mild on recent laboratories.  Will repeat.  7 joint effusion-left  first metacarpal joint is erythematous and there is an effusion.  Question gout.  This began last evening.  I have asked him to see primary care concerning this issue.  Olga Millers, MD

## 2017-04-22 NOTE — Telephone Encounter (Signed)
New Message   Pt c/o swelling: STAT is pt has developed SOB within 24 hours  1) How much weight have you gained and in what time span? About 2lbs  2) If swelling, where is the swelling located? Legs and feets  3) Are you currently taking a fluid pill? Yes Lasix  4) Are you currently SOB? yes  5) Do you have a log of your daily weights (if so, list)? Yes, [phone]  6) Have you gained 3 pounds in a day or 5 pounds in a week? no  7) Have you traveled recently? No Wife says patient went to urgent care on 2/1 because of fluid and they prescribed him lasix but the swelling is not going down and has spread to feet and they are red

## 2017-04-22 NOTE — Telephone Encounter (Signed)
Returned the call to the patient's wife. She stated that for the last three weeks the patient has developed a cough and increased bilateral lower extremity edema. The wife has taken the patient to the PCP and an urgent care. Chest x-Verhagen was unremarkable. BNP was 495 and the patient was started on Lasix 20 mg daily for four days.  The patient would like to follow up with Dr. Jens Somrenshaw. [facility] at 8:40. The patient will also increase the Lasix to 40 mg daily and have a BMET drawn. The wife has verbalized her understanding.

## 2017-04-24 ENCOUNTER — Ambulatory Visit (INDEPENDENT_AMBULATORY_CARE_PROVIDER_SITE_OTHER): Payer: Medicare Other | Admitting: Cardiology

## 2017-04-24 ENCOUNTER — Ambulatory Visit: Payer: Self-pay | Admitting: Adult Health

## 2017-04-24 ENCOUNTER — Encounter: Payer: Self-pay | Admitting: Cardiology

## 2017-04-24 VITALS — BP 148/82 | HR 71 | Ht 74.0 in | Wt 222.0 lb

## 2017-04-24 DIAGNOSIS — E7800 Pure hypercholesterolemia, unspecified: Secondary | ICD-10-CM

## 2017-04-24 DIAGNOSIS — I25810 Atherosclerosis of coronary artery bypass graft(s) without angina pectoris: Secondary | ICD-10-CM | POA: Diagnosis not present

## 2017-04-24 DIAGNOSIS — I2510 Atherosclerotic heart disease of native coronary artery without angina pectoris: Secondary | ICD-10-CM | POA: Diagnosis not present

## 2017-04-24 DIAGNOSIS — I10 Essential (primary) hypertension: Secondary | ICD-10-CM

## 2017-04-24 DIAGNOSIS — I5043 Acute on chronic combined systolic (congestive) and diastolic (congestive) heart failure: Secondary | ICD-10-CM

## 2017-04-24 MED ORDER — FUROSEMIDE 20 MG PO TABS: 40.0000 mg | ORAL_TABLET | Freq: Every day | ORAL | 3 refills | Status: AC

## 2017-04-24 MED ORDER — FUROSEMIDE 20 MG PO TABS: 40.0000 mg | ORAL_TABLET | Freq: Every day | ORAL | 3 refills | Status: DC

## 2017-04-24 NOTE — Patient Instructions (Signed)
Medication Instructions:   INCREASE FUROSEMIDE TO 60 MG ONCE DAILY X 3 DAYS = 3 OF THE 20 MG TABLETS ONCE DAILY X 3 DAYS AFTER 3 DAYS DECREASE FUROSEMIDE TO 40 MG ONCE DAILY= 2 OF THE 20 MG TABLETS ONCE DAILY  Labwork:  Your physician recommends that you HAVE LAB WORK TODAY  Your physician recommends that you return for lab work in: ONE WEEK  Follow-Up:  Your physician recommends that you schedule a follow-up appointment in: [facility]  Your physician recommends that you schedule a follow-up appointment in: 8-12 WEEKS WITH DR J[place] SomRENSHAW IN [place]   If you need a refill on your cardiac medications before your next appointment, please call your pharmacy.

## 2017-04-25 ENCOUNTER — Encounter: Payer: Self-pay | Admitting: Adult Health

## 2017-04-25 ENCOUNTER — Ambulatory Visit (INDEPENDENT_AMBULATORY_CARE_PROVIDER_SITE_OTHER): Payer: Medicare Other | Admitting: Adult Health

## 2017-04-25 VITALS — BP 140/60 | HR 76 | Temp 97.7°F | Wt 220.0 lb

## 2017-04-25 DIAGNOSIS — I25810 Atherosclerosis of coronary artery bypass graft(s) without angina pectoris: Secondary | ICD-10-CM | POA: Diagnosis not present

## 2017-04-25 DIAGNOSIS — M1029 Drug-induced gout, multiple sites: Secondary | ICD-10-CM

## 2017-04-25 LAB — BASIC METABOLIC PANEL
BUN/Creatinine Ratio: 23 (ref 10–24)
BUN: 27 mg/dL (ref 8–27)
CO2: 24 mmol/L (ref 20–29)
Calcium: 8.9 mg/dL (ref 8.6–10.2)
Chloride: 103 mmol/L (ref 96–106)
Creatinine, Ser: 1.18 mg/dL (ref 0.76–1.27)
GFR calc Af Amer: 71 mL/min/{1.73_m2} (ref >59)
GFR calc non Af Amer: 61 mL/min/{1.73_m2} (ref >59)
Glucose: 107 mg/dL — ABNORMAL HIGH (ref 65–99)
Potassium: 4 mmol/L (ref 3.5–5.2)
Sodium: 144 mmol/L (ref 134–144)

## 2017-04-25 LAB — URIC ACID: Uric Acid, Serum: 7.7 mg/dL (ref 4.0–7.8)

## 2017-04-25 MED ORDER — COLCHICINE 0.6 MG PO CAPS: 0.6000 mg | ORAL_CAPSULE | Freq: Every day | ORAL | 0 refills | Status: DC

## 2017-04-25 NOTE — Progress Notes (Signed)
   Subjective:    Patient ID: Luis Yu, male    DOB: Nov 17, 1945, 72 y.o.   MRN: [id number]  HPI    Review of Systems     Objective:   Physical Exam        Assessment & Plan:

## 2017-04-25 NOTE — Progress Notes (Signed)
Subjective:    Patient ID: Luis Yu, male    DOB: [birth year], 72 y.o.   MRN: [id number]  HPI  72 year old male who  has a past medical history of Adjustment disorder with mixed emotional features, Atrial flutter (HCC), CAD (coronary artery disease), Depression, Diabetes mellitus type II, uncontrolled (HCC), Diabetic peripheral neuropathy (HCC), Erectile dysfunction, GERD (gastroesophageal reflux disease), Herpes zoster, Hyperlipidemia, Hypertension, Obesity, Osteoarthritis, Postherpetic neuralgia, and Sleep apnea.  He presents to the office today for possible gout flare that first started Tuesday.  He was seen at urgent care 04/19/2017 for persistent cough.  It was suspected it could be a component of CHF contributing to patient's cough and dyspnea.  BNP was checked and was elevated 495then decided to start Lasix 20 mg for 3-4 days.  4 days later started developing pain, redness, and swelling to left thumb and wrist.  He was seen by cardiology who questioned gout but wanted him to follow-up with his primary care provider.  Cardiology office yesterday increased Lasix to 60 mg daily for 3 days and then 40 mg daily thereafter.  In the office he reports that he continues to have pain and swelling in his thumb and wrist.  More recently started to notice redness warmth and trace swelling to right great toe.  Not eat a lot of organ meats, does not drink alcohol, did have shrimp 3-4 days ago.  Review of Systems See HPI   Past Medical History:  Diagnosis Date  . Adjustment disorder with mixed emotional features   . Atrial flutter (HCC)   . CAD (coronary artery disease)   . Depression   . Diabetes mellitus type II, uncontrolled (HCC)   . Diabetic peripheral neuropathy (HCC)   . Erectile dysfunction   . GERD (gastroesophageal reflux disease)   . Herpes zoster   . Hyperlipidemia   . Hypertension   . Obesity   . Osteoarthritis   . Postherpetic neuralgia   . Sleep apnea     Social History    Socioeconomic History  . Marital status: Married    Spouse name: [name]  . Number of children: 1  . Years of education: 39  . Highest education level: Not on file  Social Needs  . Financial resource strain: Not on file  . Food insecurity - worry: Not on file  . Food insecurity - inability: Not on file  . Transportation needs - medical: Not on file  . Transportation needs - non-medical: Not on file  Occupational History  . Occupation: Retired Publishing rights manager: RETIRED  Tobacco Use  . Smoking status: Never Smoker  . Smokeless tobacco: Never Used  Substance and Sexual Activity  . Alcohol use: Yes    Alcohol/week: 1.2 oz    Types: 2 Standard drinks or equivalent per week    Comment: twice a month.  . Drug use: No  . Sexual activity: Yes    Partners: Female  Other Topics Concern  . Not on file  Social History Narrative   Married [name])   Regular exercise: none   Caffeine use: cup of coffee daily    Retired Naval architect   Caffeine- twice daily          Past Surgical History:  Procedure Laterality Date  . CARDIOVERSION N/A 11/10/2015   Procedure: CARDIOVERSION;  Surgeon: Laurey Morale, MD;  Location: [facility] ENDOSCOPY;  Service: Cardiovascular;  Laterality: N/A;  . CARDIOVERSION N/A 02/21/2016   Procedure: CARDIOVERSION;  Surgeon: Lewayne Bunting, MD;  Location: [facility] ENDOSCOPY;  Service: Cardiovascular;  Laterality: N/A;  . CORONARY ARTERY BYPASS GRAFT  1996   eith a LIMA to the LAD, saphenous vein graft to the acute marginal, saphenous vein graft to the PDA and saphenous vein graft to the circumflex.  [name] KNEE ARTHROSCOPY  05/2004   right knee  . REPLACEMENT TOTAL KNEE BILATERAL    . SHOULDER SURGERY      Family History  Problem Relation Age of Onset  . Coronary artery disease Mother        deceased at 35  . Coronary artery disease Father        died age 18  . Stroke Unknown        half brother  . Stroke Brother     No Known Allergies  Current  Outpatient Medications on File Prior to Visit  Medication Sig Dispense Refill  . acetaminophen (TYLENOL) 500 MG tablet Take 2 tablets (1,000 mg total) by mouth every 6 (six) hours as needed. (Patient taking differently: Take 1,000 mg by mouth every 6 (six) hours as needed for mild pain. ) 30 tablet 0  . apixaban (ELIQUIS) 5 MG TABS tablet Take 1 tablet (5 mg total) by mouth 2 (two) times daily. 180 tablet 3  . atorvastatin (LIPITOR) 80 MG tablet Take 1 tablet (80 mg total) by mouth daily. 90 tablet 3  . buPROPion (WELLBUTRIN SR) 150 MG 12 hr tablet Take 1 tablet (150 mg total) by mouth 2 (two) times daily. 180 tablet 1  . furosemide (LASIX) 20 MG tablet Take 2 tablets (40 mg total) by mouth daily. 180 tablet 3  . glipiZIDE (GLUCOTROL) 10 MG tablet Take 10 mg by mouth daily.    [name] glucose blood (ACCU-CHEK ACTIVE STRIPS) test strip Use as instructed 100 each 12  . HYDROcodone-homatropine (HYCODAN) 5-1.5 MG/5ML syrup Take 5 mLs by mouth every 8 (eight) hours as needed for cough. 120 mL 0  . Insulin Glargine (BASAGLAR KWIKPEN) 100 UNIT/ML SOPN Inject 0.25 mLs (25 Units total) into the skin at bedtime. (Patient taking differently: Inject 20 Units into the skin at bedtime. ) 3 pen 11  . Insulin Pen Needle (PEN NEEDLES 31GX5/16") 31G X 8 MM MISC Use to inject insulin 5 times a day. 100 each 11  . isosorbide mononitrate (IMDUR) 30 MG 24 hr tablet Take 1 tablet (30 mg total) by mouth daily. 90 tablet 3  . Lancets (ACCU-CHEK MULTICLIX) lancets Use up to 5 times a day to check blood sugar. 100 each 5  . losartan-hydrochlorothiazide (HYZAAR) 50-12.5 MG tablet TAKE 1 TABLET BY MOUTH EVERY DAY 90 tablet 1  . metFORMIN (GLUCOPHAGE) 1000 MG tablet TAKE 1 TABLET (1,000 MG TOTAL) BY MOUTH 2 (TWO) TIMES DAILY WITH A MEAL. 180 tablet 1  . mirtazapine (REMERON) 30 MG tablet TAKE 1 TABLET (30 MG TOTAL) BY MOUTH AT BEDTIME. 90 tablet 1  . Nebivolol HCl (BYSTOLIC) 20 MG TABS Take 1 tablet by mouth daily.    [name] omeprazole  (PRILOSEC) 20 MG capsule TAKE ONE CAPSULE TWICE A DAY BEFORE A MEAL 180 capsule 0  . thiamine 100 MG tablet Take 1 tablet (100 mg total) by mouth daily. 90 tablet 3  . vitamin B-12 (CYANOCOBALAMIN) 1000 MCG tablet Take 2 tablets (2,000 mcg total) by mouth daily. 180 tablet 3  . clopidogrel (PLAVIX) 75 MG tablet Take 1 tablet (75 mg total) daily by mouth. (Patient not taking: Reported on 04/24/2017) 90 tablet 3  No current facility-administered medications on file prior to visit.     BP 140/60   Pulse 76   Temp 97.7 F (36.5 C) (Oral)   Wt 220 lb (99.8 kg)   SpO2 97%   BMI 28.25 kg/m       Objective:   Physical Exam  Constitutional: He is oriented to person, place, and time. He appears well-developed and well-nourished. No distress.  Cardiovascular: Normal rate, regular rhythm, normal heart sounds and intact distal pulses. Exam reveals no gallop and no friction rub.  No murmur heard. Pulmonary/Chest: Effort normal and breath sounds normal. No respiratory distress. He has no wheezes. He has no rales. He exhibits no tenderness.  Musculoskeletal: He exhibits edema and tenderness.  Tenderness in left thumb and left wrist.  Trace erythema noted, significant swelling to left thumb metacarpal that extends to the radius  Also with redness, warmth, and slight pain( history of neuropathy) to right great toe  Neurological: He is alert and oriented to person, place, and time.  Skin: Skin is warm and dry. No rash noted. He is not diaphoretic. There is erythema. No pallor.  Psychiatric: He has a normal mood and affect. His behavior is normal. Judgment and thought content normal.  Nursing note and vitals reviewed.     Assessment & Plan:  1. Acute drug-induced gout of multiple sites -Exam consistent with acute gout flare.  Will check uric acid and prescribe colchicine.  Advised his wife to give 2 colchicine pills when they get them, given additional dose an hour later and starting tomorrow take 1  pill daily until gout flare resolves. - Uric Acid - Colchicine 0.6 MG CAPS; Take 0.6 mg by mouth daily.  Dispense: 30 capsule; Refill: 0 -Follow up in 1 week if needed or sooner if symptoms do not start to resolved   Shirline Freesory Mery Guadalupe, NP

## 2017-04-25 NOTE — Patient Instructions (Signed)
Today take 2 pills and then an hour later take another pill   Starting tomorrow, take one pill daily until the gout flare has resolved

## 2017-05-01 ENCOUNTER — Ambulatory Visit (INDEPENDENT_AMBULATORY_CARE_PROVIDER_SITE_OTHER): Payer: Medicare Other | Admitting: Podiatry

## 2017-05-01 ENCOUNTER — Other Ambulatory Visit: Payer: Self-pay | Admitting: Family Medicine

## 2017-05-01 ENCOUNTER — Encounter: Payer: Self-pay | Admitting: Podiatry

## 2017-05-01 DIAGNOSIS — M79609 Pain in unspecified limb: Secondary | ICD-10-CM

## 2017-05-01 DIAGNOSIS — E1140 Type 2 diabetes mellitus with diabetic neuropathy, unspecified: Secondary | ICD-10-CM

## 2017-05-01 DIAGNOSIS — Z794 Long term (current) use of insulin: Secondary | ICD-10-CM

## 2017-05-01 DIAGNOSIS — B351 Tinea unguium: Secondary | ICD-10-CM | POA: Diagnosis not present

## 2017-05-01 DIAGNOSIS — D689 Coagulation defect, unspecified: Secondary | ICD-10-CM

## 2017-05-01 MED ORDER — DOXYCYCLINE HYCLATE 100 MG PO TABS: 100.0000 mg | ORAL_TABLET | Freq: Two times a day (BID) | ORAL | 0 refills | Status: DC

## 2017-05-01 NOTE — Progress Notes (Signed)
Patient ID: Luis ImusDonald W Mancinas, male   DOB: 1945/11/03, 72 y.o.   MRN: [id number] Complaint:  Visit Type: Patient returns to my office for continued preventative foot care services. Complaint: Patient states" my nails have grown long and thick and become painful to walk and wear shoes" Patient has been diagnosed with DM with neuropathy. Patient is taking plavix.  Patient had episode of breathing problems while waiting for treatment  Treated with oxygen.  Patient also has history of swollen feet which was treated with Lasix.  His gout treated with medicine.  Podiatric Exam: Vascular: dorsalis pedis and posterior tibial pulses are palpable bilateral. Capillary return is immediate. Cold feet noted.. Skin turgor WNL  Sensorium: Normal Semmes Weinstein monofilament test. Normal tactile sensation bilaterally. Nail Exam: Pt has thick disfigured discolored nails with subungual debris noted bilateral entire nail hallux through fifth toenails Ulcer Exam: There is no evidence of ulcer or pre-ulcerative changes or infection. Orthopedic Exam: Muscle tone and strength are WNL. No limitations in general ROM. No crepitus or effusions noted. HAV 1st MPJ  B/L.  S/P fracture great toe right foot. Skin: No Porokeratosis. No infection or ulcers.  Right hallux was normal color with no increased temperature.   Diagnosis:  Onychomycosis, , Pain in right toe, pain in left toes    Treatment & Plan Procedures and Treatment: Consent by patient was obtained for treatment procedures. The patient understood the discussion of treatment and procedures well. All questions were answered thoroughly reviewed. Debridement of mycotic and hypertrophic toenails, 1 through 5 bilateral and clearing of subungual debris. No ulceration, no infection noted.   Return Visit-Office Procedure: Patient instructed to return to the office for a follow up visit 3 months for continued evaluation and treatment.    Helane GuntherGregory Kamylah Manzo DPM

## 2017-05-02 ENCOUNTER — Encounter: Payer: Self-pay | Admitting: Physician Assistant

## 2017-05-02 ENCOUNTER — Ambulatory Visit (INDEPENDENT_AMBULATORY_CARE_PROVIDER_SITE_OTHER): Payer: Medicare Other | Admitting: Physician Assistant

## 2017-05-02 VITALS — BP 116/62 | HR 66 | Ht 74.0 in | Wt 209.0 lb

## 2017-05-02 DIAGNOSIS — E119 Type 2 diabetes mellitus without complications: Secondary | ICD-10-CM | POA: Diagnosis not present

## 2017-05-02 DIAGNOSIS — I480 Paroxysmal atrial fibrillation: Secondary | ICD-10-CM | POA: Diagnosis not present

## 2017-05-02 DIAGNOSIS — I5033 Acute on chronic diastolic (congestive) heart failure: Secondary | ICD-10-CM | POA: Diagnosis not present

## 2017-05-02 DIAGNOSIS — E785 Hyperlipidemia, unspecified: Secondary | ICD-10-CM | POA: Diagnosis not present

## 2017-05-02 DIAGNOSIS — I25810 Atherosclerosis of coronary artery bypass graft(s) without angina pectoris: Secondary | ICD-10-CM

## 2017-05-02 DIAGNOSIS — I5032 Chronic diastolic (congestive) heart failure: Secondary | ICD-10-CM | POA: Diagnosis not present

## 2017-05-02 DIAGNOSIS — R0602 Shortness of breath: Secondary | ICD-10-CM

## 2017-05-02 DIAGNOSIS — I10 Essential (primary) hypertension: Secondary | ICD-10-CM

## 2017-05-02 DIAGNOSIS — D649 Anemia, unspecified: Secondary | ICD-10-CM | POA: Diagnosis not present

## 2017-05-02 MED ORDER — LOSARTAN POTASSIUM 50 MG PO TABS: 50.0000 mg | ORAL_TABLET | Freq: Every day | ORAL | 3 refills | Status: DC

## 2017-05-02 NOTE — Patient Instructions (Signed)
Medication Instructions: Azalee CourseHao Meng, PA-C has recommended making the following medication changes: 1. STOP Losartan-HCT 2. START Losartan 50 mg - take 1 tablet by mouth daily  Labwork: Your physician recommends that you return for lab work TODAY.  Testing/Procedures: NONE ORDERED  Follow-up: Please keep your follow-up appointment with Dr Jens Somrenshaw in May.  If you need a refill on your cardiac medications before your next appointment, please call your pharmacy.

## 2017-05-02 NOTE — Progress Notes (Signed)
Cardiology Office Note    Date:  05/04/2017   ID:  Cody, Albus 09-18-1945, MRN [id number]  PCP:  Shirline Frees, NP  Cardiologist:  Dr. Jens Som   Chief Complaint  Patient presents with  . Follow-up    seen for Dr. Jens Som, recent volume overload    History of Present Illness:  Luis Yu is a 72 y.o. male  with PMH of DM II, HTN, HLD, OSA, CAD s/p CABG and atrial flutter/afib. He had previous PCI of SVG to PDA. Cardiac catheterization in 2011 revealed EF 50%, continued patency of the LIMA to LAD, occluded SVG to the previously stented distal left circumflex/PDA territory. Patient was evaluated by CVTS, distal vessel was felt to be poor targets, CABG could be considered in the future if PCI option were exhausted. He had PCI of SVG to OM. Myoview obtained in August 2017 showed EF 48%, no ischemia or infarction. He had a cardioversion in August 2017 and December 2017. He was seen by Dr. Johney Frame for consideration of ablation and rate control was recommended. Tikosyn could be considered. He had a CVA in April 2018. Repeat echocardiogram in April 2018 showed a normal LV systolic function, moderate LVH, mild biatrial enlargement, mild mitral regurgitation and moderately elevated pulmonary pressure. Carotid ultrasound in April 2018 showed mild disease bilaterally.  Plavix was discontinued due to bleeding in the eye.  Patient presents today along with his wife.  He continued to have dyspnea at baseline.  This has not changed much.  His wife is worried he is depressed after the stroke in 2018 and has not enjoyed doing anything at home.  He was placed on 60 mg Lasix for 3 days before dropping down to 40 mg daily of Lasix on the last office visit due to concern of volume overload.  Prior to that he was on 20 mg Lasix for only 1-2 weeks that was given by local urgent care.  Overall, he is new to Lasix diuretic this year.  He is still on losartan-HCTZ, I will discontinue the HCTZ and prescribed  losartan 50 mg daily.  He weight has dropped from 220 pounds down to 209 pounds.  This is the lowest weight he has ever been.  I am concerned that he is being dehydrated.  I will obtain a basic metabolic panel and a CBC.  If renal function worsens, we may have to switch him to 20 mg daily of Lasix instead.  Otherwise he denies any chest pain.   Past Medical History:  Diagnosis Date  . Adjustment disorder with mixed emotional features   . Atrial flutter (HCC)   . CAD (coronary artery disease)   . Depression   . Diabetes mellitus type II, uncontrolled (HCC)   . Diabetic peripheral neuropathy (HCC)   . Erectile dysfunction   . GERD (gastroesophageal reflux disease)   . Herpes zoster   . Hyperlipidemia   . Hypertension   . Obesity   . Osteoarthritis   . Postherpetic neuralgia   . Sleep apnea     Past Surgical History:  Procedure Laterality Date  . CARDIOVERSION N/A 11/10/2015   Procedure: CARDIOVERSION;  Surgeon: Laurey Morale, MD;  Location: [facility] ENDOSCOPY;  Service: Cardiovascular;  Laterality: N/A;  . CARDIOVERSION N/A 02/21/2016   Procedure: CARDIOVERSION;  Surgeon: Lewayne Bunting, MD;  Location: [facility] ENDOSCOPY;  Service: Cardiovascular;  Laterality: N/A;  . CORONARY ARTERY BYPASS GRAFT  1996   eith a LIMA to the LAD, saphenous vein  graft to the acute marginal, saphenous vein graft to the PDA and saphenous vein graft to the circumflex.  [name]. KNEE ARTHROSCOPY  05/2004   right knee  . REPLACEMENT TOTAL KNEE BILATERAL    . SHOULDER SURGERY      Current Medications: Outpatient Medications Prior to Visit  Medication Sig Dispense Refill  . acetaminophen (TYLENOL) 500 MG tablet Take 2 tablets (1,000 mg total) by mouth every 6 (six) hours as needed. (Patient taking differently: Take 1,000 mg by mouth every 6 (six) hours as needed for mild pain. ) 30 tablet 0  . apixaban (ELIQUIS) 5 MG TABS tablet Take 1 tablet (5 mg total) by mouth 2 (two) times daily. 180 tablet 3  . atorvastatin  (LIPITOR) 80 MG tablet Take 1 tablet (80 mg total) by mouth daily. 90 tablet 3  . buPROPion (WELLBUTRIN SR) 150 MG 12 hr tablet Take 1 tablet (150 mg total) by mouth 2 (two) times daily. 180 tablet 1  . Colchicine 0.6 MG CAPS Take 0.6 mg by mouth daily. 30 capsule 0  . doxycycline (VIBRA-TABS) 100 MG tablet Take 1 tablet (100 mg total) by mouth 2 (two) times daily. 14 tablet 0  . furosemide (LASIX) 20 MG tablet Take 2 tablets (40 mg total) by mouth daily. 180 tablet 3  . glipiZIDE (GLUCOTROL) 10 MG tablet Take 10 mg by mouth daily.    [name]. glucose blood (ACCU-CHEK ACTIVE STRIPS) test strip Use as instructed 100 each 12  . HYDROcodone-homatropine (HYCODAN) 5-1.5 MG/5ML syrup Take 5 mLs by mouth every 8 (eight) hours as needed for cough. 120 mL 0  . Insulin Glargine (BASAGLAR KWIKPEN) 100 UNIT/ML SOPN Inject 0.25 mLs (25 Units total) into the skin at bedtime. (Patient taking differently: Inject 20 Units into the skin at bedtime. ) 3 pen 11  . Insulin Pen Needle (PEN NEEDLES 31GX5/16") 31G X 8 MM MISC Use to inject insulin 5 times a day. 100 each 11  . isosorbide mononitrate (IMDUR) 30 MG 24 hr tablet Take 1 tablet (30 mg total) by mouth daily. 90 tablet 3  . Lancets (ACCU-CHEK MULTICLIX) lancets Use up to 5 times a day to check blood sugar. 100 each 5  . metFORMIN (GLUCOPHAGE) 1000 MG tablet TAKE 1 TABLET (1,000 MG TOTAL) BY MOUTH 2 (TWO) TIMES DAILY WITH A MEAL. 180 tablet 1  . mirtazapine (REMERON) 15 MG tablet Take 15 mg by mouth at bedtime.    . Nebivolol HCl (BYSTOLIC) 20 MG TABS Take 1 tablet by mouth daily.    [name]. omeprazole (PRILOSEC) 20 MG capsule TAKE ONE CAPSULE TWICE A DAY BEFORE A MEAL 180 capsule 0  . thiamine 100 MG tablet Take 1 tablet (100 mg total) by mouth daily. 90 tablet 3  . vitamin B-12 (CYANOCOBALAMIN) 1000 MCG tablet Take 2 tablets (2,000 mcg total) by mouth daily. 180 tablet 3  . losartan-hydrochlorothiazide (HYZAAR) 50-12.5 MG tablet TAKE 1 TABLET BY MOUTH EVERY DAY 90 tablet 1    . clopidogrel (PLAVIX) 75 MG tablet Take 1 tablet (75 mg total) daily by mouth. 90 tablet 3  . mirtazapine (REMERON) 30 MG tablet TAKE 1 TABLET (30 MG TOTAL) BY MOUTH AT BEDTIME. 90 tablet 1   No facility-administered medications prior to visit.      Allergies:   Patient has no known allergies.   Social History   Socioeconomic History  . Marital status: Married    Spouse name: [name]  . Number of children: 1  . Years of education:  13  . Highest education level: None  Social Needs  . Financial resource strain: None  . Food insecurity - worry: None  . Food insecurity - inability: None  . Transportation needs - medical: None  . Transportation needs - non-medical: None  Occupational History  . Occupation: Retired Publishing rights manager: RETIRED  Tobacco Use  . Smoking status: Never Smoker  . Smokeless tobacco: Never Used  Substance and Sexual Activity  . Alcohol use: Yes    Alcohol/week: 1.2 oz    Types: 2 Standard drinks or equivalent per week    Comment: twice a month.  . Drug use: No  . Sexual activity: Yes    Partners: Female  Other Topics Concern  . None  Social History Narrative   Married Counselling psychologist)   Regular exercise: none   Caffeine use: cup of coffee daily    Retired Naval architect   Caffeine- twice daily           Family History:  The patient's family history includes Coronary artery disease in his father and mother; Stroke in his brother and unknown relative.   ROS:   Please see the history of present illness.    ROS All other systems reviewed and are negative.   PHYSICAL EXAM:   VS:  BP 116/62   Pulse 66   Ht 6\' 2"  (1.88 m)   Wt 209 lb (94.8 kg)   SpO2 97%   BMI 26.83 kg/m    GEN: Well nourished, well developed, in no acute distress  HEENT: normal  Neck: no JVD, carotid bruits, or masses Cardiac: RRR; no murmurs, rubs, or gallops,no edema  Respiratory:  clear to auscultation bilaterally, normal work of breathing GI: soft, nontender,  nondistended, + BS MS: no deformity or atrophy  Skin: warm and dry, no rash Neuro:  Alert and Oriented x 3, Strength and sensation are intact Psych: euthymic mood, full affect  Wt Readings from Last 3 Encounters:  05/02/17 209 lb (94.8 kg)  04/25/17 220 lb (99.8 kg)  04/24/17 222 lb (100.7 kg)      Studies/Labs Reviewed:   EKG:  EKG is not ordered today.   Recent Labs: 12/29/2016: ALT 18 04/19/2017: Brain Natriuretic Peptide 495; TSH 2.71 05/02/2017: BUN 23; Creatinine, Ser 1.30; Hemoglobin 10.3; Platelets 215; Potassium 4.3; Sodium 144   Lipid Panel    Component Value Date/Time   CHOL 96 06/28/2016 0718   TRIG 88 06/28/2016 0718   HDL 28 (L) 06/28/2016 0718   CHOLHDL 3.4 06/28/2016 0718   VLDL 18 06/28/2016 0718   LDLCALC 50 06/28/2016 0718    Additional studies/ records that were reviewed today include:   Myoview 10/20/2015 Study Highlights     Nuclear stress EF: 48%.  The left ventricular ejection fraction is mildly decreased (45-54%).  There was no ST segment deviation noted during stress.  This is a low risk study.  Normal resting and stress perfusion. No ischemia or infarction EF 48% but may not be accurate due to afib     Carotid US 06/28/2016 Findings are suggestive of 1-39% stenosis in bilateral internal carotid arteries   Echo 06/30/2016 LV EF: 55% - 60%  - Left ventricle: Wall thickness was increased in a pattern of moderate LVH. Systolic function was normal. The estimated ejection fraction was in the range of 55% to 60%. Wall motion was normal; there were no regional wall motion abnormalities. - Mitral valve: Mildly calcified annulus. There was mild regurgitation. - Left  atrium: The atrium was mildly dilated. - Right atrium: The atrium was mildly dilated. - Pulmonary arteries: Systolic pressure was moderately increased. PA peak pressure: 42 mm Hg (S).     ASSESSMENT:    1. Chronic diastolic congestive heart  failure (HCC)   2. Shortness of breath   3. Anemia, unspecified type   4. Essential hypertension   5. Hyperlipidemia, unspecified hyperlipidemia type   6. Controlled type 2 diabetes mellitus without complication, without long-term current use of insulin (HCC)   7. PAF (paroxysmal atrial fibrillation) (HCC)   8. Coronary artery disease involving coronary bypass graft of native heart without angina pectoris      PLAN:  In order of problems listed above:  1. Diastolic heart failure: He was given 60 mg daily of Lasix for 3 days before dropping down to 40 mg daily of Lasix.  His weight has decreased from 220 pounds down to 209 pounds.  He feels very weak at this time.  I am concerned of some degree of dehydration.  I continued him on the current dose of Lasix, instead I discontinued his hydrochlorothiazide.  Obtain basic metabolic panel  2. PAF on Eliquis: I have given him some samples.  He is currently taking Bystolic.  I did switch him to metoprolol last year for cost reasons, however he did not tolerate the switch.  3. CAD status post CABG: No chest pain  4. Hypertension: Blood pressure very well controlled  5. Hyperlipidemia: On Lipitor 80 mg daily.  Last lipid panel obtained in April 2018 showed very well controlled total cholesterol, triglyceride and LDL, HDL low.  6. DM 2: Managed by primary care provider.  7. Anemia: Most recent hemoglobin was 10.5, repeat CBC today    Medication Adjustments/Labs and Tests Ordered: Current medicines are reviewed at length with the patient today.  Concerns regarding medicines are outlined above.  Medication changes, Labs and Tests ordered today are listed in the Patient Instructions below. Patient Instructions  Medication Instructions: Azalee Course, PA-C has recommended making the following medication changes: 1. STOP Losartan-HCT 2. START Losartan 50 mg - take 1 tablet by mouth daily  Labwork: Your physician recommends that you return for lab  work TODAY.  Testing/Procedures: NONE ORDERED  Follow-up: Please keep your follow-up appointment with Dr Jens Som in May.  If you need a refill on your cardiac medications before your next appointment, please call your pharmacy.    Ramond Dial, [place]  05/04/2017 12:03 PM    [facility] [address] [place], [place], [place]  [address] Phone: [phone]; Fax: [phone]

## 2017-05-03 ENCOUNTER — Telehealth: Payer: Self-pay | Admitting: Cardiology

## 2017-05-03 DIAGNOSIS — N289 Disorder of kidney and ureter, unspecified: Secondary | ICD-10-CM

## 2017-05-03 LAB — CBC
Hematocrit: 33.8 % — ABNORMAL LOW (ref 37.5–51.0)
Hemoglobin: 10.3 g/dL — ABNORMAL LOW (ref 13.0–17.7)
MCH: 29.9 pg (ref 26.6–33.0)
MCHC: 30.5 g/dL — ABNORMAL LOW (ref 31.5–35.7)
MCV: 98 fL — ABNORMAL HIGH (ref 79–97)
Platelets: 215 10*3/uL (ref 150–379)
RBC: 3.44 x10E6/uL — ABNORMAL LOW (ref 4.14–5.80)
RDW: 18 % — ABNORMAL HIGH (ref 12.3–15.4)
WBC: 5.2 10*3/uL (ref 3.4–10.8)

## 2017-05-03 LAB — BASIC METABOLIC PANEL
BUN/Creatinine Ratio: 18 (ref 10–24)
BUN: 23 mg/dL (ref 8–27)
CO2: 27 mmol/L (ref 20–29)
Calcium: 9 mg/dL (ref 8.6–10.2)
Chloride: 100 mmol/L (ref 96–106)
Creatinine, Ser: 1.3 mg/dL — ABNORMAL HIGH (ref 0.76–1.27)
GFR calc Af Amer: 63 mL/min/{1.73_m2} (ref >59)
GFR calc non Af Amer: 55 mL/min/{1.73_m2} — ABNORMAL LOW (ref >59)
Glucose: 141 mg/dL — ABNORMAL HIGH (ref 65–99)
Potassium: 4.3 mmol/L (ref 3.5–5.2)
Sodium: 144 mmol/L (ref 134–144)

## 2017-05-03 NOTE — Telephone Encounter (Signed)
Spoke with pt, see result notes. Lab orders mailed to the pt

## 2017-05-03 NOTE — Telephone Encounter (Addendum)
Labs available in Epic, will make hao meng pa aware so he can review and advise.

## 2017-05-03 NOTE — Telephone Encounter (Signed)
done

## 2017-05-03 NOTE — Telephone Encounter (Signed)
Pt's wife calling to get lab results from yesterday, if not back yet she wants to verify pt is to stay on 40mg  Lasix through the weekend, he has lost weight per wife--pls advise [phone]

## 2017-05-04 ENCOUNTER — Encounter: Payer: Self-pay | Admitting: Physician Assistant

## 2017-05-07 ENCOUNTER — Telehealth: Payer: Self-pay

## 2017-05-07 ENCOUNTER — Other Ambulatory Visit: Payer: Self-pay

## 2017-05-07 NOTE — Telephone Encounter (Signed)
Spoke with wife to give her the lab results for patient, she voiced understanding and wanted to know if these labs affect the patient getting the infections in his eye due to his eye bleeding, she was wondering if patient needs some kind of clearance. She is concerned because patient has so many health issues that she dosen't know what to do.

## 2017-05-07 NOTE — Telephone Encounter (Signed)
Should not affect the patient's eye. Depend on what type of eye procedure he will need. Medical therapy for the eye does not need clearance. Most of the eye procedure also considered low risk and does not need medical clearance. However if procedure is needed and need to come off of eliquis temporarily, patient will need to let us know so we can check with our clinical pharmacist on how long prior to the procedure to hold eliquis.

## 2017-05-09 ENCOUNTER — Ambulatory Visit: Payer: Self-pay | Admitting: *Deleted

## 2017-05-09 NOTE — Telephone Encounter (Signed)
Check with whoever is doing the injection, they may not necessarily require him to come off the eliquis. If they can do the injection on eliquis, I am more in favor of that since he had stroke with afib. Coming off eliquis will increase risk of recurrent stroke

## 2017-05-09 NOTE — Telephone Encounter (Signed)
It CAN cause rare instances of low platelet  counts. He should no longer have the gout flare so he can stop taking this medication. If he continues to have pain then we need to reevaluate

## 2017-05-09 NOTE — Telephone Encounter (Signed)
Pt    Wife   Is    Concerned  Because    The  Pharmacist  From  [facility]   Called    Stating the  The  New  Medication Mitigare   That  Was   Prescribed on Feb 7  For  Gout    May   Have  Affects  Of  Causing  Bleeding .  The  Patient   Is  Taking  Eliquis  At this  Time .  Pt  Was  On plavix  Which  Was   Stopped  In middle   Of  January.   Pt  Is scheduled   To  Have  Eye  Injections  In Feb 27  By  Dr  Stephannie LiJason  Sanders . The  Pt  Reports reports   Seeing   Black  Snakes in  L eye  Since Jan and   Dr Allyne GeeSanders said that is a  Symptom  Of  Bleeding  Behind  The  Eye. Wife  states  pt began symptoms  Of  Seeing  Snakes  Behind the  Right  Eye  3  Days  Ago as  Well . He  Denies  Any  Other  New   Symptoms. Patients  Wife  [name]  Is   Concerned  After what the  Pharmacist  Told  Her about   Mitigare  And  The  Patients new  Symptoms  In  R  Eye . Patients   Wife   Wants  To know  If patient  Should  Continue  Taking the  Medication  Because  Of   Side  Affects  Of bleeding  Behind the  Eye . Pt  Would  Like  The  Provider to  Advise  And  Contact  Her  With  Advice. Call back number is    [phone]

## 2017-05-09 NOTE — Telephone Encounter (Signed)
Spoke with patients  wife and she said patient is having injections in eye next Wednesday 05/15/2017. She was wondering if he needs to hold Eliquis.she said Dr Jens Somrenshaw said for him not to hold Eliquis.

## 2017-05-10 NOTE — Telephone Encounter (Signed)
Spoke to Eagle[place] and informed her to stop gout medication.  If pain continues then will need to reevaluate.  Will come in as needed.

## 2017-05-14 DIAGNOSIS — N289 Disorder of kidney and ureter, unspecified: Secondary | ICD-10-CM | POA: Diagnosis not present

## 2017-05-14 NOTE — Telephone Encounter (Signed)
Patients wife directly notified, and voiced understanding.

## 2017-05-15 ENCOUNTER — Telehealth: Payer: Self-pay | Admitting: Pulmonary Disease

## 2017-05-15 DIAGNOSIS — E113593 Type 2 diabetes mellitus with proliferative diabetic retinopathy without macular edema, bilateral: Secondary | ICD-10-CM | POA: Diagnosis not present

## 2017-05-15 LAB — BASIC METABOLIC PANEL
BUN/Creatinine Ratio: 21 (ref 10–24)
BUN: 26 mg/dL (ref 8–27)
CO2: 25 mmol/L (ref 20–29)
Calcium: 9.6 mg/dL (ref 8.6–10.2)
Chloride: 105 mmol/L (ref 96–106)
Creatinine, Ser: 1.25 mg/dL (ref 0.76–1.27)
GFR calc Af Amer: 66 mL/min/{1.73_m2} (ref >59)
GFR calc non Af Amer: 57 mL/min/{1.73_m2} — ABNORMAL LOW (ref >59)
Glucose: 76 mg/dL (ref 65–99)
Potassium: 4.4 mmol/L (ref 3.5–5.2)
Sodium: 147 mmol/L — ABNORMAL HIGH (ref 134–144)

## 2017-05-15 NOTE — Telephone Encounter (Signed)
Spoke with pt's wife, Rivka BarbaraGlenda. She was wanting to know if he should continue ASV machine. Per the pt's wife, he has been in CHF for the past 3 weeks. She believes that she was told by Dr. Vassie LollAlva that if the pt was in CHF that he should not use the ASV machine. Rivka BarbaraGlenda would like clarification on this.  Dr. Vassie LollAlva - please advise. Thanks.

## 2017-05-15 NOTE — Telephone Encounter (Signed)
He should continue on ASV. As I have clarified to her before, contraindication is only if pump function is low, his pump function is normal

## 2017-05-15 NOTE — Telephone Encounter (Signed)
Renal function and electrolyte stable, continue current therapy

## 2017-05-15 NOTE — Telephone Encounter (Signed)
Spoke with Luis Yu, she is aware of RA's recs.   Nothing else needed at time of call.

## 2017-05-15 NOTE — Telephone Encounter (Signed)
Left message for Luis Yu to call back.

## 2017-05-17 ENCOUNTER — Other Ambulatory Visit: Payer: Self-pay | Admitting: Adult Health

## 2017-05-17 ENCOUNTER — Encounter: Payer: Self-pay | Admitting: Physician Assistant

## 2017-05-17 NOTE — Telephone Encounter (Signed)
New Message:    Pt would like his lab results from 05-14-17 please.

## 2017-05-18 ENCOUNTER — Other Ambulatory Visit: Payer: Self-pay | Admitting: Adult Health

## 2017-05-20 NOTE — Telephone Encounter (Signed)
This encounter was created in error - please disregard.

## 2017-05-21 NOTE — Telephone Encounter (Signed)
This medication refill request was denied today. Per chart, patient was changed to plain losartan without HCTZ on 05/02/17.

## 2017-05-22 ENCOUNTER — Other Ambulatory Visit: Payer: Self-pay | Admitting: Adult Health

## 2017-05-22 DIAGNOSIS — M1029 Drug-induced gout, multiple sites: Secondary | ICD-10-CM

## 2017-05-22 NOTE — Telephone Encounter (Signed)
Pt is no longer taking this medication.  Message sent to the pharmacy to discontinue.  See telephone call from 05/09/17.

## 2017-05-29 ENCOUNTER — Emergency Department (HOSPITAL_COMMUNITY): Payer: Medicare Other

## 2017-05-29 ENCOUNTER — Other Ambulatory Visit: Payer: Self-pay

## 2017-05-29 ENCOUNTER — Encounter (HOSPITAL_COMMUNITY): Payer: Self-pay

## 2017-05-29 ENCOUNTER — Inpatient Hospital Stay (HOSPITAL_COMMUNITY)
Admission: EM | Admit: 2017-05-29 | Discharge: 2017-06-15 | DRG: 871 | Disposition: A | Discharge status: Home Health | Payer: Medicare Other | Attending: Family Medicine | Admitting: Family Medicine

## 2017-05-29 DIAGNOSIS — R402362 Coma scale, best motor response, obeys commands, at arrival to emergency department: Secondary | ICD-10-CM | POA: Diagnosis present

## 2017-05-29 DIAGNOSIS — K117 Disturbances of salivary secretion: Secondary | ICD-10-CM | POA: Diagnosis not present

## 2017-05-29 DIAGNOSIS — I110 Hypertensive heart disease with heart failure: Secondary | ICD-10-CM | POA: Diagnosis present

## 2017-05-29 DIAGNOSIS — I4891 Unspecified atrial fibrillation: Secondary | ICD-10-CM | POA: Diagnosis present

## 2017-05-29 DIAGNOSIS — F05 Delirium due to known physiological condition: Secondary | ICD-10-CM | POA: Diagnosis present

## 2017-05-29 DIAGNOSIS — Z794 Long term (current) use of insulin: Secondary | ICD-10-CM

## 2017-05-29 DIAGNOSIS — Z823 Family history of stroke: Secondary | ICD-10-CM

## 2017-05-29 DIAGNOSIS — E785 Hyperlipidemia, unspecified: Secondary | ICD-10-CM | POA: Diagnosis not present

## 2017-05-29 DIAGNOSIS — E1142 Type 2 diabetes mellitus with diabetic polyneuropathy: Secondary | ICD-10-CM | POA: Diagnosis present

## 2017-05-29 DIAGNOSIS — K5900 Constipation, unspecified: Secondary | ICD-10-CM | POA: Diagnosis not present

## 2017-05-29 DIAGNOSIS — R509 Fever, unspecified: Secondary | ICD-10-CM | POA: Diagnosis not present

## 2017-05-29 DIAGNOSIS — E43 Unspecified severe protein-calorie malnutrition: Secondary | ICD-10-CM | POA: Diagnosis not present

## 2017-05-29 DIAGNOSIS — I25810 Atherosclerosis of coronary artery bypass graft(s) without angina pectoris: Secondary | ICD-10-CM | POA: Diagnosis present

## 2017-05-29 DIAGNOSIS — Z951 Presence of aortocoronary bypass graft: Secondary | ICD-10-CM

## 2017-05-29 DIAGNOSIS — G92 Toxic encephalopathy: Secondary | ICD-10-CM | POA: Diagnosis not present

## 2017-05-29 DIAGNOSIS — I481 Persistent atrial fibrillation: Secondary | ICD-10-CM | POA: Diagnosis not present

## 2017-05-29 DIAGNOSIS — I10 Essential (primary) hypertension: Secondary | ICD-10-CM | POA: Diagnosis not present

## 2017-05-29 DIAGNOSIS — F0150 Vascular dementia without behavioral disturbance: Secondary | ICD-10-CM | POA: Diagnosis present

## 2017-05-29 DIAGNOSIS — E1165 Type 2 diabetes mellitus with hyperglycemia: Secondary | ICD-10-CM | POA: Diagnosis present

## 2017-05-29 DIAGNOSIS — I509 Heart failure, unspecified: Secondary | ICD-10-CM

## 2017-05-29 DIAGNOSIS — B0229 Other postherpetic nervous system involvement: Secondary | ICD-10-CM | POA: Diagnosis present

## 2017-05-29 DIAGNOSIS — R339 Retention of urine, unspecified: Secondary | ICD-10-CM | POA: Diagnosis present

## 2017-05-29 DIAGNOSIS — T424X5A Adverse effect of benzodiazepines, initial encounter: Secondary | ICD-10-CM | POA: Diagnosis not present

## 2017-05-29 DIAGNOSIS — I959 Hypotension, unspecified: Secondary | ICD-10-CM | POA: Diagnosis present

## 2017-05-29 DIAGNOSIS — Z8249 Family history of ischemic heart disease and other diseases of the circulatory system: Secondary | ICD-10-CM

## 2017-05-29 DIAGNOSIS — I6340 Cerebral infarction due to embolism of unspecified cerebral artery: Secondary | ICD-10-CM | POA: Diagnosis not present

## 2017-05-29 DIAGNOSIS — E669 Obesity, unspecified: Secondary | ICD-10-CM | POA: Diagnosis present

## 2017-05-29 DIAGNOSIS — R05 Cough: Secondary | ICD-10-CM | POA: Diagnosis not present

## 2017-05-29 DIAGNOSIS — J690 Pneumonitis due to inhalation of food and vomit: Secondary | ICD-10-CM | POA: Diagnosis not present

## 2017-05-29 DIAGNOSIS — I5032 Chronic diastolic (congestive) heart failure: Secondary | ICD-10-CM | POA: Diagnosis present

## 2017-05-29 DIAGNOSIS — I639 Cerebral infarction, unspecified: Secondary | ICD-10-CM | POA: Diagnosis present

## 2017-05-29 DIAGNOSIS — Z7901 Long term (current) use of anticoagulants: Secondary | ICD-10-CM

## 2017-05-29 DIAGNOSIS — K219 Gastro-esophageal reflux disease without esophagitis: Secondary | ICD-10-CM | POA: Diagnosis present

## 2017-05-29 DIAGNOSIS — R451 Restlessness and agitation: Secondary | ICD-10-CM

## 2017-05-29 DIAGNOSIS — E1139 Type 2 diabetes mellitus with other diabetic ophthalmic complication: Secondary | ICD-10-CM | POA: Diagnosis not present

## 2017-05-29 DIAGNOSIS — IMO0002 Reserved for concepts with insufficient information to code with codable children: Secondary | ICD-10-CM | POA: Diagnosis present

## 2017-05-29 DIAGNOSIS — Z66 Do not resuscitate: Secondary | ICD-10-CM | POA: Diagnosis present

## 2017-05-29 DIAGNOSIS — R221 Localized swelling, mass and lump, neck: Secondary | ICD-10-CM | POA: Diagnosis not present

## 2017-05-29 DIAGNOSIS — A419 Sepsis, unspecified organism: Secondary | ICD-10-CM | POA: Diagnosis not present

## 2017-05-29 DIAGNOSIS — I2510 Atherosclerotic heart disease of native coronary artery without angina pectoris: Secondary | ICD-10-CM | POA: Diagnosis present

## 2017-05-29 DIAGNOSIS — R000 Tachycardia, unspecified: Secondary | ICD-10-CM | POA: Diagnosis not present

## 2017-05-29 DIAGNOSIS — T434X5A Adverse effect of butyrophenone and thiothixene neuroleptics, initial encounter: Secondary | ICD-10-CM | POA: Diagnosis not present

## 2017-05-29 DIAGNOSIS — K047 Periapical abscess without sinus: Secondary | ICD-10-CM | POA: Diagnosis present

## 2017-05-29 DIAGNOSIS — F0151 Vascular dementia with behavioral disturbance: Secondary | ICD-10-CM | POA: Diagnosis present

## 2017-05-29 DIAGNOSIS — G4700 Insomnia, unspecified: Secondary | ICD-10-CM | POA: Diagnosis present

## 2017-05-29 DIAGNOSIS — K920 Hematemesis: Secondary | ICD-10-CM | POA: Diagnosis not present

## 2017-05-29 DIAGNOSIS — Z6824 Body mass index (BMI) 24.0-24.9, adult: Secondary | ICD-10-CM

## 2017-05-29 DIAGNOSIS — R402142 Coma scale, eyes open, spontaneous, at arrival to emergency department: Secondary | ICD-10-CM | POA: Diagnosis present

## 2017-05-29 DIAGNOSIS — R402242 Coma scale, best verbal response, confused conversation, at arrival to emergency department: Secondary | ICD-10-CM | POA: Diagnosis present

## 2017-05-29 DIAGNOSIS — I4892 Unspecified atrial flutter: Secondary | ICD-10-CM | POA: Diagnosis present

## 2017-05-29 DIAGNOSIS — Z781 Physical restraint status: Secondary | ICD-10-CM

## 2017-05-29 DIAGNOSIS — I480 Paroxysmal atrial fibrillation: Secondary | ICD-10-CM | POA: Diagnosis present

## 2017-05-29 DIAGNOSIS — G4733 Obstructive sleep apnea (adult) (pediatric): Secondary | ICD-10-CM | POA: Diagnosis present

## 2017-05-29 DIAGNOSIS — R0682 Tachypnea, not elsewhere classified: Secondary | ICD-10-CM | POA: Diagnosis not present

## 2017-05-29 DIAGNOSIS — E11319 Type 2 diabetes mellitus with unspecified diabetic retinopathy without macular edema: Secondary | ICD-10-CM | POA: Diagnosis present

## 2017-05-29 DIAGNOSIS — F4323 Adjustment disorder with mixed anxiety and depressed mood: Secondary | ICD-10-CM | POA: Diagnosis present

## 2017-05-29 DIAGNOSIS — R402411 Glasgow coma scale score 13-15, in the field [EMT or ambulance]: Secondary | ICD-10-CM | POA: Diagnosis not present

## 2017-05-29 DIAGNOSIS — Z96653 Presence of artificial knee joint, bilateral: Secondary | ICD-10-CM | POA: Diagnosis present

## 2017-05-29 DIAGNOSIS — I69919 Unspecified symptoms and signs involving cognitive functions following unspecified cerebrovascular disease: Secondary | ICD-10-CM | POA: Diagnosis present

## 2017-05-29 DIAGNOSIS — R471 Dysarthria and anarthria: Secondary | ICD-10-CM | POA: Diagnosis not present

## 2017-05-29 DIAGNOSIS — N529 Male erectile dysfunction, unspecified: Secondary | ICD-10-CM | POA: Diagnosis present

## 2017-05-29 LAB — GLUCOSE, CAPILLARY
Glucose-Capillary: 127 mg/dL — ABNORMAL HIGH (ref 65–99)
Glucose-Capillary: 153 mg/dL — ABNORMAL HIGH (ref 65–99)
Glucose-Capillary: 186 mg/dL — ABNORMAL HIGH (ref 65–99)

## 2017-05-29 LAB — COMPREHENSIVE METABOLIC PANEL
ALT: 20 U/L (ref 17–63)
AST: 24 U/L (ref 15–41)
Albumin: 3.6 g/dL (ref 3.5–5.0)
Alkaline Phosphatase: 126 U/L (ref 38–126)
Anion gap: 12 (ref 5–15)
BUN: 33 mg/dL — ABNORMAL HIGH (ref 6–20)
CO2: 23 mmol/L (ref 22–32)
Calcium: 9.3 mg/dL (ref 8.9–10.3)
Chloride: 106 mmol/L (ref 101–111)
Creatinine, Ser: 1.23 mg/dL (ref 0.61–1.24)
GFR calc Af Amer: >60 mL/min (ref >60)
GFR calc non Af Amer: 57 mL/min — ABNORMAL LOW (ref >60)
Glucose, Bld: 108 mg/dL — ABNORMAL HIGH (ref 65–99)
Potassium: 4.5 mmol/L (ref 3.5–5.1)
Sodium: 141 mmol/L (ref 135–145)
Total Bilirubin: 1.7 mg/dL — ABNORMAL HIGH (ref 0.3–1.2)
Total Protein: 6.6 g/dL (ref 6.5–8.1)

## 2017-05-29 LAB — URINALYSIS, ROUTINE W REFLEX MICROSCOPIC
Bacteria, UA: NONE SEEN
Bilirubin Urine: NEGATIVE
Glucose, UA: NEGATIVE mg/dL
Hgb urine dipstick: NEGATIVE
Ketones, ur: NEGATIVE mg/dL
Leukocytes, UA: NEGATIVE
Nitrite: NEGATIVE
Protein, ur: 30 mg/dL — AB
Specific Gravity, Urine: 1.02 (ref 1.005–1.030)
pH: 5 (ref 5.0–8.0)

## 2017-05-29 LAB — CBC WITH DIFFERENTIAL/PLATELET
Basophils Absolute: 0 10*3/uL (ref 0.0–0.1)
Basophils Relative: 0 %
Eosinophils Absolute: 1 10*3/uL — ABNORMAL HIGH (ref 0.0–0.7)
Eosinophils Relative: 9 %
HCT: 33.8 % — ABNORMAL LOW (ref 39.0–52.0)
Hemoglobin: 10.1 g/dL — ABNORMAL LOW (ref 13.0–17.0)
Lymphocytes Relative: 5 %
Lymphs Abs: 0.6 10*3/uL — ABNORMAL LOW (ref 0.7–4.0)
MCH: 30.1 pg (ref 26.0–34.0)
MCHC: 29.9 g/dL — ABNORMAL LOW (ref 30.0–36.0)
MCV: 100.9 fL — ABNORMAL HIGH (ref 78.0–100.0)
Monocytes Absolute: 0.9 10*3/uL (ref 0.1–1.0)
Monocytes Relative: 8 %
Neutro Abs: 8.6 10*3/uL — ABNORMAL HIGH (ref 1.7–7.7)
Neutrophils Relative %: 78 %
Platelets: 199 10*3/uL (ref 150–400)
RBC: 3.35 MIL/uL — ABNORMAL LOW (ref 4.22–5.81)
RDW: 18.5 % — ABNORMAL HIGH (ref 11.5–15.5)
WBC: 11.1 10*3/uL — ABNORMAL HIGH (ref 4.0–10.5)

## 2017-05-29 LAB — I-STAT CG4 LACTIC ACID, ED
Lactic Acid, Venous: 1.71 mmol/L (ref 0.5–1.9)
Lactic Acid, Venous: 1.36 mmol/L (ref 0.5–1.9)

## 2017-05-29 LAB — INFLUENZA PANEL BY PCR (TYPE A & B)
Influenza A By PCR: NEGATIVE
Influenza B By PCR: NEGATIVE

## 2017-05-29 LAB — CBG MONITORING, ED: Glucose-Capillary: 100 mg/dL — ABNORMAL HIGH (ref 65–99)

## 2017-05-29 MED ORDER — ATORVASTATIN CALCIUM 40 MG PO TABS
80.0000 mg | ORAL_TABLET | Freq: Every day | ORAL | Status: DC
  Administered 2017-05-29 – 2017-06-13 (×13): 80 mg via ORAL
  Filled 2017-05-29 (×14): qty 2

## 2017-05-29 MED ORDER — HALOPERIDOL LACTATE 5 MG/ML IJ SOLN
2.5000 mg | Freq: Once | INTRAMUSCULAR | Status: AC
  Administered 2017-05-29: 2.5 mg via INTRAVENOUS
  Filled 2017-05-29: qty 1

## 2017-05-29 MED ORDER — PANTOPRAZOLE SODIUM 40 MG PO TBEC
40.0000 mg | DELAYED_RELEASE_TABLET | Freq: Two times a day (BID) | ORAL | Status: DC
  Administered 2017-05-29 – 2017-06-15 (×25): 40 mg via ORAL
  Filled 2017-05-29 (×30): qty 1

## 2017-05-29 MED ORDER — ACETAMINOPHEN 650 MG RE SUPP: 650.0000 mg | Freq: Four times a day (QID) | RECTAL | Status: DC | PRN

## 2017-05-29 MED ORDER — ATORVASTATIN CALCIUM 40 MG PO TABS
80.0000 mg | ORAL_TABLET | Freq: Every day | ORAL | Status: DC
  Filled 2017-05-29: qty 2

## 2017-05-29 MED ORDER — ISOSORBIDE MONONITRATE ER 60 MG PO TB24
30.0000 mg | ORAL_TABLET | Freq: Every day | ORAL | Status: DC
  Administered 2017-05-29 – 2017-06-15 (×15): 30 mg via ORAL
  Filled 2017-05-29 (×16): qty 1

## 2017-05-29 MED ORDER — APIXABAN 5 MG PO TABS
5.0000 mg | ORAL_TABLET | Freq: Two times a day (BID) | ORAL | Status: DC
  Administered 2017-05-29 – 2017-06-02 (×9): 5 mg via ORAL
  Filled 2017-05-29 (×9): qty 1

## 2017-05-29 MED ORDER — ACETAMINOPHEN 325 MG PO TABS
650.0000 mg | ORAL_TABLET | Freq: Once | ORAL | Status: AC
  Administered 2017-05-29: 650 mg via ORAL
  Filled 2017-05-29: qty 2

## 2017-05-29 MED ORDER — GLIPIZIDE 10 MG PO TABS
10.0000 mg | ORAL_TABLET | Freq: Every day | ORAL | Status: DC
  Filled 2017-05-29: qty 1
  Filled 2017-05-29: qty 2
  Filled 2017-05-29: qty 1
  Filled 2017-05-29: qty 2

## 2017-05-29 MED ORDER — BUPROPION HCL ER (SR) 150 MG PO TB12
150.0000 mg | ORAL_TABLET | Freq: Two times a day (BID) | ORAL | Status: DC
  Administered 2017-05-29 – 2017-06-15 (×29): 150 mg via ORAL
  Filled 2017-05-29 (×38): qty 1

## 2017-05-29 MED ORDER — NEBIVOLOL HCL 10 MG PO TABS
20.0000 mg | ORAL_TABLET | Freq: Every day | ORAL | Status: DC
  Filled 2017-05-29: qty 2

## 2017-05-29 MED ORDER — ONDANSETRON HCL 4 MG/2ML IJ SOLN: 4.0000 mg | Freq: Four times a day (QID) | INTRAMUSCULAR | Status: DC | PRN

## 2017-05-29 MED ORDER — LORAZEPAM 2 MG/ML IJ SOLN
1.0000 mg | Freq: Four times a day (QID) | INTRAMUSCULAR | Status: DC | PRN
  Administered 2017-05-29 – 2017-06-02 (×6): 1 mg via INTRAVENOUS
  Filled 2017-05-29 (×7): qty 1

## 2017-05-29 MED ORDER — SODIUM CHLORIDE 0.9 % IV BOLUS (SEPSIS)
1000.0000 mL | Freq: Once | INTRAVENOUS | Status: AC
  Administered 2017-05-29: 1000 mL via INTRAVENOUS

## 2017-05-29 MED ORDER — IOPAMIDOL (ISOVUE-300) INJECTION 61%
75.0000 mL | Freq: Once | INTRAVENOUS | Status: AC | PRN
  Administered 2017-05-29: 75 mL via INTRAVENOUS

## 2017-05-29 MED ORDER — LOSARTAN POTASSIUM 50 MG PO TABS
50.0000 mg | ORAL_TABLET | Freq: Every day | ORAL | Status: DC
  Administered 2017-05-29 – 2017-06-15 (×15): 50 mg via ORAL
  Filled 2017-05-29 (×16): qty 1

## 2017-05-29 MED ORDER — NEBIVOLOL HCL 10 MG PO TABS
20.0000 mg | ORAL_TABLET | Freq: Every day | ORAL | Status: DC
  Administered 2017-05-29 – 2017-06-13 (×14): 20 mg via ORAL
  Filled 2017-05-29 (×19): qty 2

## 2017-05-29 MED ORDER — ONDANSETRON HCL 4 MG PO TABS: 4.0000 mg | ORAL_TABLET | Freq: Four times a day (QID) | ORAL | Status: DC | PRN

## 2017-05-29 MED ORDER — INSULIN GLARGINE 100 UNIT/ML ~~LOC~~ SOLN
20.0000 [IU] | Freq: Every day | SUBCUTANEOUS | Status: DC
  Administered 2017-05-29: 20 [IU] via SUBCUTANEOUS
  Filled 2017-05-29: qty 0.2

## 2017-05-29 MED ORDER — VITAMIN B-1 100 MG PO TABS
100.0000 mg | ORAL_TABLET | Freq: Every day | ORAL | Status: DC
  Administered 2017-05-29 – 2017-06-12 (×14): 100 mg via ORAL
  Filled 2017-05-29 (×16): qty 1

## 2017-05-29 MED ORDER — ACETAMINOPHEN 325 MG PO TABS
650.0000 mg | ORAL_TABLET | Freq: Four times a day (QID) | ORAL | Status: DC | PRN
  Administered 2017-05-29 – 2017-05-31 (×3): 650 mg via ORAL
  Filled 2017-05-29 (×4): qty 2

## 2017-05-29 MED ORDER — SODIUM CHLORIDE 0.9 % IV SOLN: 250.0000 mL | INTRAVENOUS | Status: DC | PRN

## 2017-05-29 MED ORDER — VITAMIN B-12 1000 MCG PO TABS
2000.0000 ug | ORAL_TABLET | Freq: Every day | ORAL | Status: DC
  Filled 2017-05-29: qty 2

## 2017-05-29 MED ORDER — MIRTAZAPINE 15 MG PO TABS
15.0000 mg | ORAL_TABLET | Freq: Every day | ORAL | Status: DC
  Administered 2017-05-29 – 2017-06-13 (×15): 15 mg via ORAL
  Filled 2017-05-29 (×19): qty 1

## 2017-05-29 MED ORDER — SODIUM CHLORIDE 0.9% FLUSH: 3.0000 mL | INTRAVENOUS | Status: DC | PRN

## 2017-05-29 MED ORDER — PIPERACILLIN-TAZOBACTAM 3.375 G IVPB 30 MIN
3.3750 g | Freq: Once | INTRAVENOUS | Status: AC
  Administered 2017-05-29: 3.375 g via INTRAVENOUS
  Filled 2017-05-29: qty 50

## 2017-05-29 MED ORDER — INSULIN ASPART 100 UNIT/ML ~~LOC~~ SOLN
0.0000 [IU] | Freq: Three times a day (TID) | SUBCUTANEOUS | Status: DC
  Administered 2017-05-29: 2 [IU] via SUBCUTANEOUS
  Administered 2017-05-29 – 2017-06-01 (×5): 3 [IU] via SUBCUTANEOUS
  Administered 2017-06-01 – 2017-06-03 (×3): 2 [IU] via SUBCUTANEOUS
  Administered 2017-06-04: 3 [IU] via SUBCUTANEOUS
  Administered 2017-06-05 – 2017-06-06 (×4): 2 [IU] via SUBCUTANEOUS
  Administered 2017-06-06 – 2017-06-07 (×3): 3 [IU] via SUBCUTANEOUS
  Administered 2017-06-07: 5 [IU] via SUBCUTANEOUS
  Administered 2017-06-08 (×2): 3 [IU] via SUBCUTANEOUS
  Administered 2017-06-08 – 2017-06-09 (×2): 5 [IU] via SUBCUTANEOUS
  Administered 2017-06-09: 3 [IU] via SUBCUTANEOUS
  Administered 2017-06-09: 2 [IU] via SUBCUTANEOUS
  Administered 2017-06-10: 5 [IU] via SUBCUTANEOUS
  Administered 2017-06-10: 3 [IU] via SUBCUTANEOUS
  Administered 2017-06-11 (×2): 2 [IU] via SUBCUTANEOUS
  Administered 2017-06-12: 3 [IU] via SUBCUTANEOUS
  Administered 2017-06-12: 2 [IU] via SUBCUTANEOUS
  Administered 2017-06-13: 3 [IU] via SUBCUTANEOUS
  Administered 2017-06-13: 8 [IU] via SUBCUTANEOUS
  Administered 2017-06-14: 5 [IU] via SUBCUTANEOUS

## 2017-05-29 MED ORDER — INSULIN ASPART 100 UNIT/ML ~~LOC~~ SOLN: 0.0000 [IU] | Freq: Every day | SUBCUTANEOUS | Status: DC

## 2017-05-29 MED ORDER — IPRATROPIUM-ALBUTEROL 0.5-2.5 (3) MG/3ML IN SOLN
3.0000 mL | Freq: Four times a day (QID) | RESPIRATORY_TRACT | Status: DC
  Administered 2017-05-29 – 2017-05-30 (×4): 3 mL via RESPIRATORY_TRACT
  Filled 2017-05-29 (×4): qty 3

## 2017-05-29 MED ORDER — VITAMIN B-12 1000 MCG PO TABS
2000.0000 ug | ORAL_TABLET | Freq: Every day | ORAL | Status: DC
  Administered 2017-05-29 – 2017-06-13 (×13): 2000 ug via ORAL
  Filled 2017-05-29 (×14): qty 2

## 2017-05-29 MED ORDER — GUAIFENESIN ER 600 MG PO TB12
1200.0000 mg | ORAL_TABLET | Freq: Two times a day (BID) | ORAL | Status: DC
  Administered 2017-05-29 – 2017-06-02 (×9): 1200 mg via ORAL
  Filled 2017-05-29 (×9): qty 2

## 2017-05-29 MED ORDER — SODIUM CHLORIDE 0.9 % IV SOLN
3.0000 g | Freq: Four times a day (QID) | INTRAVENOUS | Status: DC
  Administered 2017-05-29 – 2017-05-31 (×8): 3 g via INTRAVENOUS
  Filled 2017-05-29 (×20): qty 3

## 2017-05-29 MED ORDER — SODIUM CHLORIDE 0.9% FLUSH
3.0000 mL | Freq: Two times a day (BID) | INTRAVENOUS | Status: DC
  Administered 2017-05-29 (×2): 3 mL via INTRAVENOUS

## 2017-05-29 MED ORDER — FUROSEMIDE 40 MG PO TABS
40.0000 mg | ORAL_TABLET | Freq: Every day | ORAL | Status: DC
  Administered 2017-05-29 – 2017-06-02 (×5): 40 mg via ORAL
  Filled 2017-05-29 (×5): qty 1

## 2017-05-29 MED ORDER — VANCOMYCIN HCL IN DEXTROSE 1-5 GM/200ML-% IV SOLN
1000.0000 mg | Freq: Once | INTRAVENOUS | Status: AC
  Administered 2017-05-29: 1000 mg via INTRAVENOUS
  Filled 2017-05-29: qty 200

## 2017-05-29 NOTE — Progress Notes (Signed)
Upon admission and doing history wife reports that patient has been severely depressed since having his stroke.  She states that he will not go to see a psych MD, does not indulge in any hobbies, watch TV, or do any other activities he used to enjoy.  Says "he sits in the chair and goes back and forth to bed and that's it"  Wife also says at times he states "he wishes he would just die".  After wife left room, patient questioned privately.  Patient denies feeling this way and states he feels "happy" at home.  Reports no suicidal thoughts or thoughts of harm.  Patient is somewhat confused at this time as well.  Patient is HOH and is very unsteady on his feet.  High fall risk.  Patient also attempting to get OOB several times with out help.  says he needs to void.  Patient has been unable to twice now.  Bladder scan shows approx 200cc - patient still reports urge to go but unable void sititing or standing. Will report info to MD.

## 2017-05-29 NOTE — ED Notes (Signed)
Pt returned from CT °

## 2017-05-29 NOTE — ED Provider Notes (Addendum)
[facility] EMERGENCY DEPARTMENT Provider Note   CSN: [id number] Arrival date & time: 05/29/17  [id number]     History   Chief Complaint Chief Complaint  Patient presents with  . Chills    HPI Luis Yu is a 72 y.o. male.  Patient brought to the ER for evaluation of chills and shakes.  Patient woke up tonight and was trembling and shaking, could not stop.  He reported that he felt cold, wife gave him extra blankets.  He then started coughing.  Wife gave him cough syrup.  He then had an episode of significant coughing and the wife thought that he either coughed up or vomited blood, but EMS reports that it looked more like a cough syrup.  No evidence of blood.  Patient has been confused during transport.  Wife reports that he has a history of bad teeth with recurrent abscesses.  He started having pain and swelling on the right side of his jaw yesterday.  She started him on penicillin.      Past Medical History:  Diagnosis Date  . Adjustment disorder with mixed emotional features   . Atrial flutter (HCC)   . CAD (coronary artery disease)   . Depression   . Diabetes mellitus type II, uncontrolled (HCC)   . Diabetic peripheral neuropathy (HCC)   . Erectile dysfunction   . GERD (gastroesophageal reflux disease)   . Herpes zoster   . Hyperlipidemia   . Hypertension   . Obesity   . Osteoarthritis   . Postherpetic neuralgia   . Sleep apnea     Patient Active Problem List   Diagnosis Date Noted  . Sleep apnea   . Postherpetic neuralgia   . Osteoarthritis   . Obesity   . Hypertension   . GERD (gastroesophageal reflux disease)   . Erectile dysfunction   . Diabetic peripheral neuropathy (HCC)   . Diabetes mellitus type II, uncontrolled (HCC)   . Depression   . CAD (coronary artery disease)   . Atrial flutter (HCC)   . Adjustment disorder with mixed emotional features   . Frontal lobe and executive function deficit following cerebral infarction 07/02/2016  . Neurologic  gait disorder   . Coronary artery disease involving coronary bypass graft of native heart without angina pectoris   . Acute kidney injury (HCC)   . Nausea & vomiting 06/28/2016  . Ischemic stroke of frontal lobe (HCC) 06/28/2016  . Thrombocytopenia (HCC) 06/28/2016  . Renal failure (ARF), acute on chronic (HCC) 06/28/2016  . Stroke (cerebrum) (HCC) 06/28/2016  . Acute CVA (cerebrovascular accident) (HCC) 06/28/2016  . Insomnia 02/02/2016  . Atrial fibrillation (HCC) 10/27/2015  . Uncoordinated movements 02/24/2014  . Unspecified hereditary and idiopathic peripheral neuropathy 02/24/2013  . Low back pain 02/24/2013  . Abnormality of gait 02/09/2013  . Memory loss, short term 12/05/2011  . LUMBAR STRAIN, ACUTE 05/05/2010  . OSA (obstructive sleep apnea) 09/14/2009  . BPH (benign prostatic hyperplasia) 07/27/2009  . CARDIOVASCULAR STUDIES, ABNORMAL 07/27/2009  . CHEST PAIN 06/08/2009  . OSTEOARTHRITIS 05/30/2009  . HEEL PAIN, LEFT 05/24/2009  . Dyspnea on exertion 05/24/2009  . ADJUSTMENT DISORDER WITH MIXED FEATURES 08/25/2008  . Diabetes type 2, uncontrolled (HCC) 02/24/2008  . ERECTILE DYSFUNCTION 02/10/2007  . Hyperlipidemia 10/09/2006  . OBESITY 10/09/2006  . Essential hypertension 10/09/2006  . Coronary atherosclerosis 10/09/2006  . GERD 10/09/2006  . DM (diabetes mellitus) type II uncontrolled with retinopathy 06/10/2006    Past Surgical History:  Procedure Laterality Date  .  CARDIOVERSION N/A 11/10/2015   Procedure: CARDIOVERSION;  Surgeon: Laurey Morale, MD;  Location: [facility] ENDOSCOPY;  Service: Cardiovascular;  Laterality: N/A;  . CARDIOVERSION N/A 02/21/2016   Procedure: CARDIOVERSION;  Surgeon: Lewayne Bunting, MD;  Location: [facility] ENDOSCOPY;  Service: Cardiovascular;  Laterality: N/A;  . CORONARY ARTERY BYPASS GRAFT  1996   eith a LIMA to the LAD, saphenous vein graft to the acute marginal, saphenous vein graft to the PDA and saphenous vein graft to the circumflex.  [name]  KNEE ARTHROSCOPY  05/2004   right knee  . REPLACEMENT TOTAL KNEE BILATERAL    . SHOULDER SURGERY         Home Medications    Prior to Admission medications   Medication Sig Start Date End Date Taking? Authorizing Provider  acetaminophen (TYLENOL) 500 MG tablet Take 2 tablets (1,000 mg total) by mouth every 6 (six) hours as needed. Patient taking differently: Take 1,000 mg by mouth every 6 (six) hours as needed for mild pain.  05/24/16   Arby Barrette, MD  apixaban (ELIQUIS) 5 MG TABS tablet Take 1 tablet (5 mg total) by mouth 2 (two) times daily. 08/09/16   Nafziger, Kandee Keen, NP  atorvastatin (LIPITOR) 80 MG tablet Take 1 tablet (80 mg total) by mouth daily. 04/03/17   Nafziger, Kandee Keen, NP  buPROPion (WELLBUTRIN SR) 150 MG 12 hr tablet Take 1 tablet (150 mg total) by mouth 2 (two) times daily. 04/02/17   Nafziger, Kandee Keen, NP  doxycycline (VIBRA-TABS) 100 MG tablet Take 1 tablet (100 mg total) by mouth 2 (two) times daily. 05/01/17   Nafziger, Kandee Keen, NP  furosemide (LASIX) 20 MG tablet Take 2 tablets (40 mg total) by mouth daily. 04/24/17 07/23/17  Lewayne Bunting, MD  glipiZIDE (GLUCOTROL) 10 MG tablet Take 10 mg by mouth daily. 07/14/16   [provider]  glucose blood (ACCU-CHEK ACTIVE STRIPS) test strip Use as instructed 03/29/16   Nafziger, Kandee Keen, NP  HYDROcodone-homatropine (HYCODAN) 5-1.5 MG/5ML syrup Take 5 mLs by mouth every 8 (eight) hours as needed for cough. 04/05/17   Nafziger, Kandee Keen, NP  Insulin Glargine (BASAGLAR KWIKPEN) 100 UNIT/ML SOPN Inject 0.25 mLs (25 Units total) into the skin at bedtime. Patient taking differently: Inject 20 Units into the skin at bedtime.  07/06/16   Angiulli, Mcarthur Rossetti, PA-C  Insulin Pen Needle (PEN NEEDLES 31GX5/16") 31G X 8 MM MISC Use to inject insulin 5 times a day. 07/06/16   Angiulli, Mcarthur Rossetti, PA-C  isosorbide mononitrate (IMDUR) 30 MG 24 hr tablet Take 1 tablet (30 mg total) by mouth daily. 08/09/16   Nafziger, Kandee Keen, NP  Lancets (ACCU-CHEK MULTICLIX)  lancets Use up to 5 times a day to check blood sugar. 07/06/16   Angiulli, Mcarthur Rossetti, PA-C  losartan (COZAAR) 50 MG tablet Take 1 tablet (50 mg total) by mouth daily. 05/02/17 04/27/18  Azalee Course, PA  metFORMIN (GLUCOPHAGE) 1000 MG tablet TAKE 1 TABLET (1,000 MG TOTAL) BY MOUTH 2 (TWO) TIMES DAILY WITH A MEAL. 12/12/16   Nafziger, Kandee Keen, NP  mirtazapine (REMERON) 15 MG tablet Take 15 mg by mouth at bedtime.    [provider]  Nebivolol HCl (BYSTOLIC) 20 MG TABS Take 1 tablet by mouth daily.    [provider]  omeprazole (PRILOSEC) 20 MG capsule TAKE ONE CAPSULE TWICE A DAY BEFORE A MEAL 05/21/17   Nafziger, Kandee Keen, NP  thiamine 100 MG tablet Take 1 tablet (100 mg total) by mouth daily. 08/09/16   Shirline Frees, NP  vitamin  B-12 (CYANOCOBALAMIN) 1000 MCG tablet Take 2 tablets (2,000 mcg total) by mouth daily. 08/09/16   Shirline Frees, NP    Family History Family History  Problem Relation Age of Onset  . Coronary artery disease Mother        deceased at 72  . Coronary artery disease Father        died age 27  . Stroke Unknown        half brother  . Stroke Brother     Social History Social History   Tobacco Use  . Smoking status: Never Smoker  . Smokeless tobacco: Never Used  Substance Use Topics  . Alcohol use: Yes    Alcohol/week: 1.2 oz    Types: 2 Standard drinks or equivalent per week    Comment: twice a month.  . Drug use: No     Allergies   Patient has no known allergies.   Review of Systems Review of Systems  Constitutional: Positive for chills.  Respiratory: Positive for cough.      Physical Exam Updated Vital Signs BP 128/71   Pulse 83   Temp (!) 101.9 F (38.8 C) (Rectal)   Resp (!) 28   Ht 6\' 2"  (1.88 m)   Wt 94.8 kg (209 lb)   SpO2 96%   BMI 26.83 kg/m   Physical Exam  Constitutional: He appears well-developed and well-nourished. No distress.  HENT:  Head: Normocephalic and atraumatic.  Right Ear: Hearing normal.  Left Ear: Hearing  normal.  Nose: Nose normal.  Mouth/Throat: Oropharynx is clear and moist and mucous membranes are normal.  Right mandibular swelling  Eyes: Conjunctivae and EOM are normal. Pupils are equal, round, and reactive to light.  Neck: Normal range of motion. Neck supple.  Cardiovascular: S1 normal and S2 normal. An irregularly irregular rhythm present. Tachycardia present. Exam reveals no gallop and no friction rub.  No murmur heard. Pulmonary/Chest: Effort normal and breath sounds normal. Tachypnea noted. No respiratory distress. He exhibits no tenderness.  Abdominal: Soft. Normal appearance and bowel sounds are normal. There is no hepatosplenomegaly. There is no tenderness. There is no rebound, no guarding, no tenderness at McBurney's point and negative Murphy's sign. No hernia.  Musculoskeletal: Normal range of motion.  Neurological: He is alert. He has normal strength. He is disoriented. No cranial nerve deficit or sensory deficit. Coordination normal. GCS eye subscore is 4. GCS verbal subscore is 4. GCS motor subscore is 6.  Skin: Skin is warm, dry and intact. No rash noted. No cyanosis.  Psychiatric: He has a normal mood and affect. His speech is normal and behavior is normal. Thought content normal.  Nursing note and vitals reviewed.    ED Treatments / Results  Labs (all labs ordered are listed, but only abnormal results are displayed) Labs Reviewed  COMPREHENSIVE METABOLIC PANEL - Abnormal; Notable for the following components:      Result Value   Glucose, Bld 108 (*)    BUN 33 (*)    Total Bilirubin 1.7 (*)    GFR calc non Af Amer 57 (*)    All other components within normal limits  CBC WITH DIFFERENTIAL/PLATELET - Abnormal; Notable for the following components:   WBC 11.1 (*)    RBC 3.35 (*)    Hemoglobin 10.1 (*)    HCT 33.8 (*)    MCV 100.9 (*)    MCHC 29.9 (*)    RDW 18.5 (*)    Neutro Abs 8.6 (*)    Lymphs  Abs 0.6 (*)    Eosinophils Absolute 1.0 (*)    All other  components within normal limits  URINALYSIS, ROUTINE W REFLEX MICROSCOPIC - Abnormal; Notable for the following components:   Color, Urine AMBER (*)    APPearance HAZY (*)    Protein, ur 30 (*)    Squamous Epithelial / LPF 0-5 (*)    All other components within normal limits  CULTURE, BLOOD (ROUTINE X 2)  CULTURE, BLOOD (ROUTINE X 2)  URINE CULTURE  INFLUENZA PANEL BY PCR (TYPE A & B)  I-STAT CG4 LACTIC ACID, ED  I-STAT CG4 LACTIC ACID, ED    EKG  EKG Interpretation  Date/Time:  Wednesday May 29 2017 05:13:25 EDT Ventricular Rate:  97 PR Interval:    QRS Duration: 154 QT Interval:  386 QTC Calculation: 491 R Axis:   37 Text Interpretation:  Atrial fibrillation IVCD, consider atypical LBBB Confirmed by Gilda Crease [phone]) on 05/29/2017 5:21:48 AM       Radiology Ct Maxillofacial W Contrast  Result Date: 05/29/2017 CLINICAL DATA:  Mass, lump, or swelling of the maxillofacial structures. EXAM: CT MAXILLOFACIAL WITH CONTRAST TECHNIQUE: Multidetector CT imaging of the maxillofacial structures was performed with intravenous contrast. Multiplanar CT image reconstructions were also generated. CONTRAST:  75mL ISOVUE-300 IOPAMIDOL (ISOVUE-300) INJECTION 61% COMPARISON:  Head CT 06/28/2016 FINDINGS: Osseous: Periapical erosions around teeth 23, 24, and 29. The latter periapical erosion extends through the buccal cortex and there is regional soft tissue inflammation. No drainable collection. Cervical facet spurring. Orbits: Bilateral cataract resection.  No acute finding. Sinuses: Clear of fluid levels. There is mild mucosal thickening on the floors of the maxillary sinuses, possible retention cysts. Soft tissues: Cellulitic changes around the right mandible as noted above. No floor of mouth inflammation. Atherosclerotic calcification. Limited intracranial: Negative IMPRESSION: Odontogenic soft tissue infection related to tooth 29. Negative for abscess. Electronically Signed   By:  Marnee Spring M.D.   On: 05/29/2017 07:26   Dg Chest Port 1 View  Result Date: 05/29/2017 CLINICAL DATA:  Acute onset of fever, cough and vomiting. EXAM: PORTABLE CHEST 1 VIEW COMPARISON:  Chest radiograph performed 04/19/2017 FINDINGS: The lungs are well-aerated and clear. There is no evidence of focal opacification, pleural effusion or pneumothorax. The cardiomediastinal silhouette is borderline normal in size. The patient is status post median sternotomy, with evidence of prior CABG. No acute osseous abnormalities are seen. IMPRESSION: No acute cardiopulmonary process seen. Electronically Signed   By: Roanna Raider M.D.   On: 05/29/2017 06:04    Procedures Procedures (including critical care time)  Medications Ordered in ED Medications  piperacillin-tazobactam (ZOSYN) IVPB 3.375 g (0 g Intravenous Stopped 05/29/17 0628)  vancomycin (VANCOCIN) IVPB 1000 mg/200 mL premix (0 mg Intravenous Stopped 05/29/17 0711)  sodium chloride 0.9 % bolus 1,000 mL (0 mLs Intravenous Stopped 05/29/17 0711)  acetaminophen (TYLENOL) tablet 650 mg (650 mg Oral Given 05/29/17 0545)  iopamidol (ISOVUE-300) 61 % injection 75 mL (75 mLs Intravenous Contrast Given 05/29/17 0656)     Initial Impression / Assessment and Plan / ED Course  I have reviewed the triage vital signs and the nursing notes.  Pertinent labs & imaging results that were available during my care of the patient were reviewed by me and considered in my medical decision making (see chart for details).     Patient presents from home with chills and rigors.  He was found to have a fever of 102.3 at arrival.  Patient also noted to intermittently  be hypoxic and tachypneic.  Chest x-Pruitt, however, is clear.  He is tachycardic, in chronic atrial fibrillation.  Patient with mild leukocytosis.  Based on his high fever and confusion at arrival, sepsis protocol was initiated.  Patient started to have facial swelling yesterday.  He has a history of recurrent  dental abscesses.  Wife tells me that he has been unable to have dental procedures because of his chronic atrial fibrillation and anticoagulation.  His doctors have been reluctant to stop his anticoagulation to have dental repair.  Wife started antibiotics yesterday that she had on hand because of his numerous infections but the swelling has not improved.  Source of sepsis might be from his dental abscess.  CT maxillofacial with contrast performed to rule out large abscess, Ludwig's angina, etc. patient does have an odontogenic infection without abscess and no evidence of Ludwig's or deeper infection.  We will have hospitalist service admit the patient for continued management.  CRITICAL CARE Performed by: Gilda Crease   Total critical care time: 30 minutes  Critical care time was exclusive of separately billable procedures and treating other patients.  Critical care was necessary to treat or prevent imminent or life-threatening deterioration.  Critical care was time spent personally by me on the following activities: development of treatment plan with patient and/or surrogate as well as nursing, discussions with consultants, evaluation of patient's response to treatment, examination of patient, obtaining history from patient or surrogate, ordering and performing treatments and interventions, ordering and review of laboratory studies, ordering and review of radiographic studies, pulse oximetry and re-evaluation of patient's condition.   Final Clinical Impressions(s) / ED Diagnoses   Final diagnoses:  Fever, unspecified fever cause  Sepsis, due to unspecified organism [facility])    ED Discharge Orders    None       Gilda Crease, MD 05/29/17 0745    Gilda Crease, MD 06/19/17 [phone]

## 2017-05-29 NOTE — ED Notes (Signed)
Blood cultures x 2 drawn.

## 2017-05-29 NOTE — ED Notes (Signed)
Dr.Memon at bedside. 

## 2017-05-29 NOTE — ED Triage Notes (Signed)
EMS called out- wife reports pt woke up tonight shaking. Cough present, started tonight, pt vomited once from coughing. Pt has history of CVA with some residual memory deficits and speech deficits per wife. Pt also has fever in triage.

## 2017-05-29 NOTE — H&P (Signed)
History and Physical    Luis Yu WUJ:[id number] DOB: 1945/07/28 DOA: 05/29/2017  PCP: Shirline Frees, NP  Patient coming from: Home  I have personally briefly reviewed patient's old medical records in [facility]  Chief Complaint: Fever  HPI: Luis Yu is a 72 y.o. male with medical history significant of paroxysmal atrial fibrillation on Eliquis, chronic diastolic congestive heart failure, recurrent dental infections, presented to the hospital with fever.  Patient has had a prior stroke and has some residual cognitive effects.  History is mostly obtained from his wife.  She reports that he was in his usual state of health yesterday.  This morning he woke up confused, having chills.  He was noted to be trembling and shaking.  He began coughing.  No vomiting or diarrhea.  He was noted to be lethargic/confused.  He was brought to the emergency room for evaluation where he was noted to be mildly hypotensive/tachycardic.  He received IV fluids with correction of hemodynamics.  He was noted to be febrile with a temperature of 102.  He received Tylenol with improvement of fever.  Imaging has shown evidence of right sided dental infection.  Urinalysis and chest x-Colpitts are unrevealing.  Blood cultures have been sent.  Influenza panel is negative.  Patient is been referred for admission.  Review of Systems: As per HPI otherwise 10 point review of systems negative.    Past Medical History:  Diagnosis Date  . Adjustment disorder with mixed emotional features   . Atrial flutter (HCC)   . CAD (coronary artery disease)   . Depression   . Diabetes mellitus type II, uncontrolled (HCC)   . Diabetic peripheral neuropathy (HCC)   . Erectile dysfunction   . GERD (gastroesophageal reflux disease)   . Herpes zoster   . Hyperlipidemia   . Hypertension   . Obesity   . Osteoarthritis   . Postherpetic neuralgia   . Sleep apnea     Past Surgical History:  Procedure Laterality Date  .  CARDIOVERSION N/A 11/10/2015   Procedure: CARDIOVERSION;  Surgeon: Laurey Morale, MD;  Location: [facility] ENDOSCOPY;  Service: Cardiovascular;  Laterality: N/A;  . CARDIOVERSION N/A 02/21/2016   Procedure: CARDIOVERSION;  Surgeon: Lewayne Bunting, MD;  Location: [facility] ENDOSCOPY;  Service: Cardiovascular;  Laterality: N/A;  . CORONARY ARTERY BYPASS GRAFT  1996   eith a LIMA to the LAD, saphenous vein graft to the acute marginal, saphenous vein graft to the PDA and saphenous vein graft to the circumflex.  [name] KNEE ARTHROSCOPY  05/2004   right knee  . REPLACEMENT TOTAL KNEE BILATERAL    . SHOULDER SURGERY       reports that  has never smoked. he has never used smokeless tobacco. He reports that he drinks about 1.2 oz of alcohol per week. He reports that he does not use drugs.  No Known Allergies  Family History  Problem Relation Age of Onset  . Coronary artery disease Mother        deceased at 53  . Coronary artery disease Father        died age 75  . Stroke Unknown        half brother  . Stroke Brother     Prior to Admission medications   Medication Sig Start Date End Date Taking? Authorizing Provider  acetaminophen (TYLENOL) 500 MG tablet Take 2 tablets (1,000 mg total) by mouth every 6 (six) hours as needed. Patient taking differently: Take 1,000 mg by mouth  every 6 (six) hours as needed for mild pain.  05/24/16   Arby Barrette, MD  apixaban (ELIQUIS) 5 MG TABS tablet Take 1 tablet (5 mg total) by mouth 2 (two) times daily. 08/09/16   Nafziger, Kandee Keen, NP  atorvastatin (LIPITOR) 80 MG tablet Take 1 tablet (80 mg total) by mouth daily. 04/03/17   Nafziger, Kandee Keen, NP  buPROPion (WELLBUTRIN SR) 150 MG 12 hr tablet Take 1 tablet (150 mg total) by mouth 2 (two) times daily. 04/02/17   Nafziger, Kandee Keen, NP  doxycycline (VIBRA-TABS) 100 MG tablet Take 1 tablet (100 mg total) by mouth 2 (two) times daily. 05/01/17   Nafziger, Kandee Keen, NP  furosemide (LASIX) 20 MG tablet Take 2 tablets (40 mg total) by mouth  daily. 04/24/17 07/23/17  Lewayne Bunting, MD  glipiZIDE (GLUCOTROL) 10 MG tablet Take 10 mg by mouth daily. 07/14/16   [provider]  glucose blood (ACCU-CHEK ACTIVE STRIPS) test strip Use as instructed 03/29/16   Nafziger, Kandee Keen, NP  HYDROcodone-homatropine (HYCODAN) 5-1.5 MG/5ML syrup Take 5 mLs by mouth every 8 (eight) hours as needed for cough. 04/05/17   Nafziger, Kandee Keen, NP  Insulin Glargine (BASAGLAR KWIKPEN) 100 UNIT/ML SOPN Inject 0.25 mLs (25 Units total) into the skin at bedtime. Patient taking differently: Inject 20 Units into the skin at bedtime.  07/06/16   Angiulli, Mcarthur Rossetti, PA-C  Insulin Pen Needle (PEN NEEDLES 31GX5/16") 31G X 8 MM MISC Use to inject insulin 5 times a day. 07/06/16   Angiulli, Mcarthur Rossetti, PA-C  isosorbide mononitrate (IMDUR) 30 MG 24 hr tablet Take 1 tablet (30 mg total) by mouth daily. 08/09/16   Nafziger, Kandee Keen, NP  Lancets (ACCU-CHEK MULTICLIX) lancets Use up to 5 times a day to check blood sugar. 07/06/16   Angiulli, Mcarthur Rossetti, PA-C  losartan (COZAAR) 50 MG tablet Take 1 tablet (50 mg total) by mouth daily. 05/02/17 04/27/18  Azalee Course, PA  metFORMIN (GLUCOPHAGE) 1000 MG tablet TAKE 1 TABLET (1,000 MG TOTAL) BY MOUTH 2 (TWO) TIMES DAILY WITH A MEAL. 12/12/16   Nafziger, Kandee Keen, NP  mirtazapine (REMERON) 15 MG tablet Take 15 mg by mouth at bedtime.    [provider]  Nebivolol HCl (BYSTOLIC) 20 MG TABS Take 1 tablet by mouth daily.    [provider]  omeprazole (PRILOSEC) 20 MG capsule TAKE ONE CAPSULE TWICE A DAY BEFORE A MEAL 05/21/17   Nafziger, Kandee Keen, NP  thiamine 100 MG tablet Take 1 tablet (100 mg total) by mouth daily. 08/09/16   Nafziger, Kandee Keen, NP  vitamin B-12 (CYANOCOBALAMIN) 1000 MCG tablet Take 2 tablets (2,000 mcg total) by mouth daily. 08/09/16   Shirline Frees, NP    Physical Exam: Vitals:   05/29/17 0730 05/29/17 0800 05/29/17 0830 05/29/17 0837  BP: 105/78 (!) 129/91 140/75   Pulse: 81 78 76   Resp: (!) 34 (!) 34 20   Temp:    98.3  F (36.8 C)  TempSrc:    Oral  SpO2: 92% 92% 95%   Weight:      Height:        Constitutional: NAD, calm, comfortable Vitals:   05/29/17 0730 05/29/17 0800 05/29/17 0830 05/29/17 0837  BP: 105/78 (!) 129/91 140/75   Pulse: 81 78 76   Resp: (!) 34 (!) 34 20   Temp:    98.3 F (36.8 C)  TempSrc:    Oral  SpO2: 92% 92% 95%   Weight:      Height:  Eyes: PERRL, lids and conjunctivae normal ENMT: Mucous membranes are moist. Posterior pharynx clear of any exudate or lesions.Normal dentition.  Mild swelling of the right mandible Neck: normal, supple, no masses, no thyromegaly Respiratory: Crackles at bases. Normal respiratory effort. No accessory muscle use.  Cardiovascular: Regular rate and rhythm, no murmurs / rubs / gallops.  Trace to 1+ extremity edema. 2+ pedal pulses. No carotid bruits.  Abdomen: no tenderness, no masses palpated. No hepatosplenomegaly. Bowel sounds positive.  Musculoskeletal: no clubbing / cyanosis. No joint deformity upper and lower extremities. Good ROM, no contractures. Normal muscle tone.  Skin: no rashes, lesions, ulcers. No induration Neurologic: CN 2-12 grossly intact. Sensation intact, DTR normal. Strength 5/5 in all 4.  Psychiatric: Pleasant, confused    Labs on Admission: I have personally reviewed following labs and imaging studies  CBC: Recent Labs  Lab 05/29/17 0538  WBC 11.1*  NEUTROABS 8.6*  HGB 10.1*  HCT 33.8*  MCV 100.9*  PLT 199   Basic Metabolic Panel: Recent Labs  Lab 05/29/17 0538  NA 141  K 4.5  CL 106  CO2 23  GLUCOSE 108*  BUN 33*  CREATININE 1.23  CALCIUM 9.3   GFR: Estimated Creatinine Clearance: 63.1 mL/min (by C-G formula based on SCr of 1.23 mg/dL). Liver Function Tests: Recent Labs  Lab 05/29/17 0538  AST 24  ALT 20  ALKPHOS 126  BILITOT 1.7*  PROT 6.6  ALBUMIN 3.6   No results for input(s): LIPASE, AMYLASE in the last 168 hours. No results for input(s): AMMONIA in the last 168  hours. Coagulation Profile: No results for input(s): INR, PROTIME in the last 168 hours. Cardiac Enzymes: No results for input(s): CKTOTAL, CKMB, CKMBINDEX, TROPONINI in the last 168 hours. BNP (last 3 results) No results for input(s): PROBNP in the last 8760 hours. HbA1C: No results for input(s): HGBA1C in the last 72 hours. CBG: No results for input(s): GLUCAP in the last 168 hours. Lipid Profile: No results for input(s): CHOL, HDL, LDLCALC, TRIG, CHOLHDL, LDLDIRECT in the last 72 hours. Thyroid Function Tests: No results for input(s): TSH, T4TOTAL, FREET4, T3FREE, THYROIDAB in the last 72 hours. Anemia Panel: No results for input(s): VITAMINB12, FOLATE, FERRITIN, TIBC, IRON, RETICCTPCT in the last 72 hours. Urine analysis:    Component Value Date/Time   COLORURINE AMBER (A) 05/29/2017 0523   APPEARANCEUR HAZY (A) 05/29/2017 0523   LABSPEC 1.020 05/29/2017 0523   PHURINE 5.0 05/29/2017 0523   GLUCOSEU NEGATIVE 05/29/2017 0523   GLUCOSEU NEGATIVE 05/27/2007 0844   HGBUR NEGATIVE 05/29/2017 0523   HGBUR negative 05/05/2010 1041   BILIRUBINUR NEGATIVE 05/29/2017 0523   KETONESUR NEGATIVE 05/29/2017 0523   PROTEINUR 30 (A) 05/29/2017 0523   UROBILINOGEN 0.2 05/05/2010 1041   NITRITE NEGATIVE 05/29/2017 0523   LEUKOCYTESUR NEGATIVE 05/29/2017 0523    Radiological Exams on Admission: Ct Maxillofacial W Contrast  Result Date: 05/29/2017 CLINICAL DATA:  Mass, lump, or swelling of the maxillofacial structures. EXAM: CT MAXILLOFACIAL WITH CONTRAST TECHNIQUE: Multidetector CT imaging of the maxillofacial structures was performed with intravenous contrast. Multiplanar CT image reconstructions were also generated. CONTRAST:  75mL ISOVUE-300 IOPAMIDOL (ISOVUE-300) INJECTION 61% COMPARISON:  Head CT 06/28/2016 FINDINGS: Osseous: Periapical erosions around teeth 23, 24, and 29. The latter periapical erosion extends through the buccal cortex and there is regional soft tissue inflammation. No  drainable collection. Cervical facet spurring. Orbits: Bilateral cataract resection.  No acute finding. Sinuses: Clear of fluid levels. There is mild mucosal thickening on the floors of  the maxillary sinuses, possible retention cysts. Soft tissues: Cellulitic changes around the right mandible as noted above. No floor of mouth inflammation. Atherosclerotic calcification. Limited intracranial: Negative IMPRESSION: Odontogenic soft tissue infection related to tooth 29. Negative for abscess. Electronically Signed   By: Marnee SpringJonathon  Watts M.D.   On: 05/29/2017 07:26   Dg Chest Port 1 View  Result Date: 05/29/2017 CLINICAL DATA:  Acute onset of fever, cough and vomiting. EXAM: PORTABLE CHEST 1 VIEW COMPARISON:  Chest radiograph performed 04/19/2017 FINDINGS: The lungs are well-aerated and clear. There is no evidence of focal opacification, pleural effusion or pneumothorax. The cardiomediastinal silhouette is borderline normal in size. The patient is status post median sternotomy, with evidence of prior CABG. No acute osseous abnormalities are seen. IMPRESSION: No acute cardiopulmonary process seen. Electronically Signed   By: Roanna RaiderJeffery  Chang M.D.   On: 05/29/2017 06:04    EKG: Independently reviewed.  Atrial fibrillation with a left bundle branch block, no significant change from prior tracings  Assessment/Plan Active Problems:   DM (diabetes mellitus) type II uncontrolled with retinopathy   Hyperlipidemia   Atrial fibrillation (HCC)   Hypertension   GERD (gastroesophageal reflux disease)   Dental infection   Chronic diastolic CHF (congestive heart failure) (HCC)    1. Acute right-sided dental infection.  Patient noted to have swelling of the right mandible area.  CT confirms dental infection without any drainable abscess.  Started on Unasyn.  Once fevers have resolved, can be transitioned to oral antibiotics.  Follow-up with his primary dentist. 2. Paroxysmal atrial fibrillation.  Continue on rate  control with nebivolol and anticoagulation with Eliquis. 3. Chronic diastolic congestive heart failure.  Appears stable at this time.  Continue home dose of Lasix.  Weight appears to be near baseline.  Continue Imdur 4. GERD.  Continue on PPI 5. Hyperlipidemia.  Continue on statin. 6. Diabetes.  Continue home dose of glipizide as well as Lantus.  Start on sliding scale insulin. 7. Hypertension.  Continue on Bystolic, losartan  DVT prophylaxis: Eliquis Code Status: DNR Family Communication: Discussed with wife at the bedside Disposition Plan: Discharge home tomorrow if no further fevers Consults called:  Admission status: observation, medsurg   Erick BlinksJehanzeb Evann Erazo MD [facility] Pager [phone]  If 7PM-7AM, please contact night-coverage [email or web address] Password [email or web address]  05/29/2017, 9:06 AM

## 2017-05-29 NOTE — ED Notes (Signed)
Patient transported to CT 

## 2017-05-30 DIAGNOSIS — E1142 Type 2 diabetes mellitus with diabetic polyneuropathy: Secondary | ICD-10-CM | POA: Diagnosis not present

## 2017-05-30 DIAGNOSIS — Z008 Encounter for other general examination: Secondary | ICD-10-CM | POA: Diagnosis not present

## 2017-05-30 DIAGNOSIS — Z96653 Presence of artificial knee joint, bilateral: Secondary | ICD-10-CM | POA: Diagnosis present

## 2017-05-30 DIAGNOSIS — E1165 Type 2 diabetes mellitus with hyperglycemia: Secondary | ICD-10-CM | POA: Diagnosis not present

## 2017-05-30 DIAGNOSIS — R4182 Altered mental status, unspecified: Secondary | ICD-10-CM | POA: Diagnosis not present

## 2017-05-30 DIAGNOSIS — I4892 Unspecified atrial flutter: Secondary | ICD-10-CM | POA: Diagnosis present

## 2017-05-30 DIAGNOSIS — I509 Heart failure, unspecified: Secondary | ICD-10-CM | POA: Diagnosis not present

## 2017-05-30 DIAGNOSIS — I639 Cerebral infarction, unspecified: Secondary | ICD-10-CM | POA: Diagnosis not present

## 2017-05-30 DIAGNOSIS — E11319 Type 2 diabetes mellitus with unspecified diabetic retinopathy without macular edema: Secondary | ICD-10-CM | POA: Diagnosis present

## 2017-05-30 DIAGNOSIS — I5032 Chronic diastolic (congestive) heart failure: Secondary | ICD-10-CM | POA: Diagnosis not present

## 2017-05-30 DIAGNOSIS — R410 Disorientation, unspecified: Secondary | ICD-10-CM | POA: Diagnosis not present

## 2017-05-30 DIAGNOSIS — I6523 Occlusion and stenosis of bilateral carotid arteries: Secondary | ICD-10-CM | POA: Diagnosis not present

## 2017-05-30 DIAGNOSIS — G4733 Obstructive sleep apnea (adult) (pediatric): Secondary | ICD-10-CM | POA: Diagnosis not present

## 2017-05-30 DIAGNOSIS — G4730 Sleep apnea, unspecified: Secondary | ICD-10-CM | POA: Diagnosis not present

## 2017-05-30 DIAGNOSIS — G92 Toxic encephalopathy: Secondary | ICD-10-CM | POA: Diagnosis not present

## 2017-05-30 DIAGNOSIS — I482 Chronic atrial fibrillation: Secondary | ICD-10-CM | POA: Diagnosis not present

## 2017-05-30 DIAGNOSIS — E785 Hyperlipidemia, unspecified: Secondary | ICD-10-CM | POA: Diagnosis not present

## 2017-05-30 DIAGNOSIS — R451 Restlessness and agitation: Secondary | ICD-10-CM | POA: Diagnosis not present

## 2017-05-30 DIAGNOSIS — Z7901 Long term (current) use of anticoagulants: Secondary | ICD-10-CM | POA: Diagnosis not present

## 2017-05-30 DIAGNOSIS — E43 Unspecified severe protein-calorie malnutrition: Secondary | ICD-10-CM | POA: Diagnosis not present

## 2017-05-30 DIAGNOSIS — K219 Gastro-esophageal reflux disease without esophagitis: Secondary | ICD-10-CM | POA: Diagnosis not present

## 2017-05-30 DIAGNOSIS — E1139 Type 2 diabetes mellitus with other diabetic ophthalmic complication: Secondary | ICD-10-CM | POA: Diagnosis not present

## 2017-05-30 DIAGNOSIS — J690 Pneumonitis due to inhalation of food and vomit: Secondary | ICD-10-CM | POA: Diagnosis not present

## 2017-05-30 DIAGNOSIS — N529 Male erectile dysfunction, unspecified: Secondary | ICD-10-CM | POA: Diagnosis present

## 2017-05-30 DIAGNOSIS — R509 Fever, unspecified: Secondary | ICD-10-CM | POA: Diagnosis not present

## 2017-05-30 DIAGNOSIS — F0151 Vascular dementia with behavioral disturbance: Secondary | ICD-10-CM | POA: Diagnosis not present

## 2017-05-30 DIAGNOSIS — B0229 Other postherpetic nervous system involvement: Secondary | ICD-10-CM | POA: Diagnosis present

## 2017-05-30 DIAGNOSIS — K047 Periapical abscess without sinus: Secondary | ICD-10-CM | POA: Diagnosis not present

## 2017-05-30 DIAGNOSIS — Z951 Presence of aortocoronary bypass graft: Secondary | ICD-10-CM | POA: Diagnosis not present

## 2017-05-30 DIAGNOSIS — I25810 Atherosclerosis of coronary artery bypass graft(s) without angina pectoris: Secondary | ICD-10-CM | POA: Diagnosis not present

## 2017-05-30 DIAGNOSIS — E669 Obesity, unspecified: Secondary | ICD-10-CM | POA: Diagnosis present

## 2017-05-30 DIAGNOSIS — I6340 Cerebral infarction due to embolism of unspecified cerebral artery: Secondary | ICD-10-CM | POA: Diagnosis not present

## 2017-05-30 DIAGNOSIS — J189 Pneumonia, unspecified organism: Secondary | ICD-10-CM | POA: Diagnosis not present

## 2017-05-30 DIAGNOSIS — Z794 Long term (current) use of insulin: Secondary | ICD-10-CM | POA: Diagnosis not present

## 2017-05-30 DIAGNOSIS — A419 Sepsis, unspecified organism: Secondary | ICD-10-CM | POA: Diagnosis not present

## 2017-05-30 DIAGNOSIS — I480 Paroxysmal atrial fibrillation: Secondary | ICD-10-CM | POA: Diagnosis not present

## 2017-05-30 DIAGNOSIS — I2510 Atherosclerotic heart disease of native coronary artery without angina pectoris: Secondary | ICD-10-CM | POA: Diagnosis present

## 2017-05-30 DIAGNOSIS — I10 Essential (primary) hypertension: Secondary | ICD-10-CM | POA: Diagnosis not present

## 2017-05-30 DIAGNOSIS — I110 Hypertensive heart disease with heart failure: Secondary | ICD-10-CM | POA: Diagnosis present

## 2017-05-30 DIAGNOSIS — F05 Delirium due to known physiological condition: Secondary | ICD-10-CM | POA: Diagnosis present

## 2017-05-30 LAB — GLUCOSE, CAPILLARY
Glucose-Capillary: 98 mg/dL (ref 65–99)
Glucose-Capillary: 116 mg/dL — ABNORMAL HIGH (ref 65–99)
Glucose-Capillary: 155 mg/dL — ABNORMAL HIGH (ref 65–99)
Glucose-Capillary: 182 mg/dL — ABNORMAL HIGH (ref 65–99)

## 2017-05-30 LAB — BASIC METABOLIC PANEL
Anion gap: 11 (ref 5–15)
BUN: 31 mg/dL — ABNORMAL HIGH (ref 6–20)
CO2: 23 mmol/L (ref 22–32)
Calcium: 9 mg/dL (ref 8.9–10.3)
Chloride: 107 mmol/L (ref 101–111)
Creatinine, Ser: 1.28 mg/dL — ABNORMAL HIGH (ref 0.61–1.24)
GFR calc Af Amer: >60 mL/min (ref >60)
GFR calc non Af Amer: 54 mL/min — ABNORMAL LOW (ref >60)
Glucose, Bld: 113 mg/dL — ABNORMAL HIGH (ref 65–99)
Potassium: 4 mmol/L (ref 3.5–5.1)
Sodium: 141 mmol/L (ref 135–145)

## 2017-05-30 LAB — CBC
HCT: 31.3 % — ABNORMAL LOW (ref 39.0–52.0)
Hemoglobin: 9.3 g/dL — ABNORMAL LOW (ref 13.0–17.0)
MCH: 30.1 pg (ref 26.0–34.0)
MCHC: 29.7 g/dL — ABNORMAL LOW (ref 30.0–36.0)
MCV: 101.3 fL — ABNORMAL HIGH (ref 78.0–100.0)
Platelets: 161 10*3/uL (ref 150–400)
RBC: 3.09 MIL/uL — ABNORMAL LOW (ref 4.22–5.81)
RDW: 18.8 % — ABNORMAL HIGH (ref 11.5–15.5)
WBC: 9.8 10*3/uL (ref 4.0–10.5)

## 2017-05-30 MED ORDER — INSULIN GLARGINE 100 UNIT/ML ~~LOC~~ SOLN
10.0000 [IU] | Freq: Every day | SUBCUTANEOUS | Status: DC
  Administered 2017-05-30 – 2017-06-09 (×11): 10 [IU] via SUBCUTANEOUS
  Filled 2017-05-30 (×12): qty 0.1

## 2017-05-30 MED ORDER — TAMSULOSIN HCL 0.4 MG PO CAPS
0.4000 mg | ORAL_CAPSULE | Freq: Every day | ORAL | Status: DC
  Administered 2017-05-30 – 2017-06-11 (×12): 0.4 mg via ORAL
  Filled 2017-05-30 (×13): qty 1

## 2017-05-30 MED ORDER — IPRATROPIUM-ALBUTEROL 0.5-2.5 (3) MG/3ML IN SOLN: 3.0000 mL | Freq: Four times a day (QID) | RESPIRATORY_TRACT | Status: DC | PRN

## 2017-05-30 MED ORDER — GLIPIZIDE 5 MG PO TABS
5.0000 mg | ORAL_TABLET | Freq: Every day | ORAL | Status: DC
  Administered 2017-05-31 – 2017-06-02 (×3): 5 mg via ORAL
  Filled 2017-05-30 (×3): qty 1

## 2017-05-30 NOTE — Plan of Care (Signed)
  Acute Rehab PT Goals(only PT should resolve) Pt Will Go Supine/Side To Sit 05/30/2017 1419 - Progressing by Ocie BobWatkins, Archit Leger, PT Flowsheets Taken 05/30/2017 1419  Pt will go Supine/Side to Sit with supervision Patient Will Transfer Sit To/From Stand 05/30/2017 1419 - Progressing by Ocie BobWatkins, Carlos Quackenbush, PT Flowsheets Taken 05/30/2017 1419  Patient will transfer sit to/from stand with min guard assist Pt Will Transfer Bed To Chair/Chair To Bed 05/30/2017 1419 - Progressing by Ocie BobWatkins, Millicent Blazejewski, PT Flowsheets Taken 05/30/2017 1419  Pt will Transfer Bed to Chair/Chair to Bed min guard assist Pt Will Ambulate 05/30/2017 1419 - Progressing by Ocie BobWatkins, Nataki Mccrumb, PT Flowsheets Taken 05/30/2017 1419  Pt will Ambulate 50 feet;with minimal assist;with rolling walker   2:20 PM, 05/30/17 Ocie BobJames Trynity Skousen, MPT Physical Therapist with [facility] [phone] office [phone] mobile phone

## 2017-05-30 NOTE — Progress Notes (Signed)
History and Physical    Luis Yu ZOX:[id number] DOB: Oct 09, 1945 DOA: 05/29/2017  PCP: Shirline Frees, NP  Patient coming from: Home  I have personally briefly reviewed patient's old medical records in [facility]  Chief Complaint: Fever  HPI: Luis Yu is a 72 y.o. male with medical history significant of paroxysmal atrial fibrillation on Eliquis, chronic diastolic congestive heart failure, recurrent dental infections, presented to the hospital with fever.  Patient has had a prior stroke and has some residual cognitive effects.  History is mostly obtained from his wife.  She reports that he was in his usual state of health yesterday.  This morning he woke up confused, having chills.  He was noted to be trembling and shaking.  He began coughing.  No vomiting or diarrhea.  He was noted to be lethargic/confused.  He was brought to the emergency room for evaluation where he was noted to be mildly hypotensive/tachycardic.  He received IV fluids with correction of hemodynamics.  He was noted to be febrile with a temperature of 102.  He received Tylenol with improvement of fever.  Imaging has shown evidence of right sided dental infection.  Urinalysis and chest x-Repass are unrevealing.  Blood cultures have been sent.  Influenza panel is negative.  Patient is been referred for admission.   Review of Systems: Patient is obtunded on somnolent in bed, appears to be in no distress but unable to answer any questions.   Past Medical History:  Diagnosis Date  . Adjustment disorder with mixed emotional features   . Atrial flutter (HCC)   . CAD (coronary artery disease)   . Depression   . Diabetes mellitus type II, uncontrolled (HCC)   . Diabetic peripheral neuropathy (HCC)   . Erectile dysfunction   . GERD (gastroesophageal reflux disease)   . Herpes zoster   . Hyperlipidemia   . Hypertension   . Obesity   . Osteoarthritis   . Postherpetic neuralgia   . Sleep apnea     Past Surgical  History:  Procedure Laterality Date  . CARDIOVERSION N/A 11/10/2015   Procedure: CARDIOVERSION;  Surgeon: Laurey Morale, MD;  Location: [facility] ENDOSCOPY;  Service: Cardiovascular;  Laterality: N/A;  . CARDIOVERSION N/A 02/21/2016   Procedure: CARDIOVERSION;  Surgeon: Lewayne Bunting, MD;  Location: [facility] ENDOSCOPY;  Service: Cardiovascular;  Laterality: N/A;  . CORONARY ARTERY BYPASS GRAFT  1996   eith a LIMA to the LAD, saphenous vein graft to the acute marginal, saphenous vein graft to the PDA and saphenous vein graft to the circumflex.  [name] KNEE ARTHROSCOPY  05/2004   right knee  . REPLACEMENT TOTAL KNEE BILATERAL    . SHOULDER SURGERY       reports that  has never smoked. he has never used smokeless tobacco. He reports that he drinks about 1.2 oz of alcohol per week. He reports that he does not use drugs.  No Known Allergies  Family History  Problem Relation Age of Onset  . Coronary artery disease Mother        deceased at 20  . Coronary artery disease Father        died age 49  . Stroke Unknown        half brother  . Stroke Brother     Prior to Admission medications   Medication Sig Start Date End Date Taking? Authorizing Provider  acetaminophen (TYLENOL) 500 MG tablet Take 2 tablets (1,000 mg total) by mouth every 6 (six) hours as  needed. Patient taking differently: Take 1,000 mg by mouth every 6 (six) hours as needed for mild pain.  05/24/16   Arby Barrette, MD  apixaban (ELIQUIS) 5 MG TABS tablet Take 1 tablet (5 mg total) by mouth 2 (two) times daily. 08/09/16   Nafziger, Kandee Keen, NP  atorvastatin (LIPITOR) 80 MG tablet Take 1 tablet (80 mg total) by mouth daily. 04/03/17   Nafziger, Kandee Keen, NP  buPROPion (WELLBUTRIN SR) 150 MG 12 hr tablet Take 1 tablet (150 mg total) by mouth 2 (two) times daily. 04/02/17   Nafziger, Kandee Keen, NP  doxycycline (VIBRA-TABS) 100 MG tablet Take 1 tablet (100 mg total) by mouth 2 (two) times daily. 05/01/17   Nafziger, Kandee Keen, NP  furosemide (LASIX) 20 MG tablet  Take 2 tablets (40 mg total) by mouth daily. 04/24/17 07/23/17  Lewayne Bunting, MD  glipiZIDE (GLUCOTROL) 10 MG tablet Take 10 mg by mouth daily. 07/14/16   [provider]  glucose blood (ACCU-CHEK ACTIVE STRIPS) test strip Use as instructed 03/29/16   Nafziger, Kandee Keen, NP  HYDROcodone-homatropine (HYCODAN) 5-1.5 MG/5ML syrup Take 5 mLs by mouth every 8 (eight) hours as needed for cough. 04/05/17   Nafziger, Kandee Keen, NP  Insulin Glargine (BASAGLAR KWIKPEN) 100 UNIT/ML SOPN Inject 0.25 mLs (25 Units total) into the skin at bedtime. Patient taking differently: Inject 20 Units into the skin at bedtime.  07/06/16   Angiulli, Mcarthur Rossetti, PA-C  Insulin Pen Needle (PEN NEEDLES 31GX5/16") 31G X 8 MM MISC Use to inject insulin 5 times a day. 07/06/16   Angiulli, Mcarthur Rossetti, PA-C  isosorbide mononitrate (IMDUR) 30 MG 24 hr tablet Take 1 tablet (30 mg total) by mouth daily. 08/09/16   Nafziger, Kandee Keen, NP  Lancets (ACCU-CHEK MULTICLIX) lancets Use up to 5 times a day to check blood sugar. 07/06/16   Angiulli, Mcarthur Rossetti, PA-C  losartan (COZAAR) 50 MG tablet Take 1 tablet (50 mg total) by mouth daily. 05/02/17 04/27/18  Azalee Course, PA  metFORMIN (GLUCOPHAGE) 1000 MG tablet TAKE 1 TABLET (1,000 MG TOTAL) BY MOUTH 2 (TWO) TIMES DAILY WITH A MEAL. 12/12/16   Nafziger, Kandee Keen, NP  mirtazapine (REMERON) 15 MG tablet Take 15 mg by mouth at bedtime.    [provider]  Nebivolol HCl (BYSTOLIC) 20 MG TABS Take 1 tablet by mouth daily.    [provider]  omeprazole (PRILOSEC) 20 MG capsule TAKE ONE CAPSULE TWICE A DAY BEFORE A MEAL 05/21/17   Nafziger, Kandee Keen, NP  thiamine 100 MG tablet Take 1 tablet (100 mg total) by mouth daily. 08/09/16   Nafziger, Kandee Keen, NP  vitamin B-12 (CYANOCOBALAMIN) 1000 MCG tablet Take 2 tablets (2,000 mcg total) by mouth daily. 08/09/16   Shirline Frees, NP    Physical Exam:  Vitals:   05/29/17 1449 05/29/17 1951 05/29/17 2020 05/30/17 0731  BP:  (!) 133/46    Pulse:  64    Resp:  20      Temp:  99.4 F (37.4 C)    TempSrc:  Oral    SpO2: 96% 98% 97% 96%  Weight:      Height:        Exam  Somnolent in bed unable to answer questions or follow, No new F.N deficits, Normal affect [place].AT,PERRAL Supple Neck,No JVD, No cervical lymphadenopathy appriciated.  Symmetrical Chest wall movement, Good air movement bilaterally, CTAB RRR,No Gallops, Rubs or new Murmurs, No Parasternal Heave +ve B.Sounds, Abd Soft, No tenderness, No organomegaly appriciated, No rebound - guarding or rigidity.  Foley in place, No Cyanosis, Clubbing or edema, No new Rash or bruise   Labs on Admission: I have personally reviewed following labs and imaging studies  CBC: Recent Labs  Lab 05/29/17 0538 05/30/17 0401  WBC 11.1* 9.8  NEUTROABS 8.6*  --   HGB 10.1* 9.3*  HCT 33.8* 31.3*  MCV 100.9* 101.3*  PLT 199 161   Basic Metabolic Panel: Recent Labs  Lab 05/29/17 0538 05/30/17 0401  NA 141 141  K 4.5 4.0  CL 106 107  CO2 23 23  GLUCOSE 108* 113*  BUN 33* 31*  CREATININE 1.23 1.28*  CALCIUM 9.3 9.0   GFR: Estimated Creatinine Clearance: 60.7 mL/min (A) (by C-G formula based on SCr of 1.28 mg/dL (H)). Liver Function Tests: Recent Labs  Lab 05/29/17 0538  AST 24  ALT 20  ALKPHOS 126  BILITOT 1.7*  PROT 6.6  ALBUMIN 3.6   No results for input(s): LIPASE, AMYLASE in the last 168 hours. No results for input(s): AMMONIA in the last 168 hours. Coagulation Profile: No results for input(s): INR, PROTIME in the last 168 hours. Cardiac Enzymes: No results for input(s): CKTOTAL, CKMB, CKMBINDEX, TROPONINI in the last 168 hours. BNP (last 3 results) No results for input(s): PROBNP in the last 8760 hours. HbA1C: No results for input(s): HGBA1C in the last 72 hours. CBG: Recent Labs  Lab 05/29/17 1031 05/29/17 1116 05/29/17 1627 05/29/17 2014 05/30/17 0750  GLUCAP 100* 127* 153* 186* 98   Lipid Profile: No results for input(s): CHOL, HDL, LDLCALC, TRIG, CHOLHDL, LDLDIRECT  in the last 72 hours. Thyroid Function Tests: No results for input(s): TSH, T4TOTAL, FREET4, T3FREE, THYROIDAB in the last 72 hours. Anemia Panel: No results for input(s): VITAMINB12, FOLATE, FERRITIN, TIBC, IRON, RETICCTPCT in the last 72 hours. Urine analysis:    Component Value Date/Time   COLORURINE AMBER (A) 05/29/2017 0523   APPEARANCEUR HAZY (A) 05/29/2017 0523   LABSPEC 1.020 05/29/2017 0523   PHURINE 5.0 05/29/2017 0523   GLUCOSEU NEGATIVE 05/29/2017 0523   GLUCOSEU NEGATIVE 05/27/2007 0844   HGBUR NEGATIVE 05/29/2017 0523   HGBUR negative 05/05/2010 1041   BILIRUBINUR NEGATIVE 05/29/2017 0523   KETONESUR NEGATIVE 05/29/2017 0523   PROTEINUR 30 (A) 05/29/2017 0523   UROBILINOGEN 0.2 05/05/2010 1041   NITRITE NEGATIVE 05/29/2017 0523   LEUKOCYTESUR NEGATIVE 05/29/2017 0523    Radiological Exams on Admission: Ct Maxillofacial W Contrast  Result Date: 05/29/2017 CLINICAL DATA:  Mass, lump, or swelling of the maxillofacial structures. EXAM: CT MAXILLOFACIAL WITH CONTRAST TECHNIQUE: Multidetector CT imaging of the maxillofacial structures was performed with intravenous contrast. Multiplanar CT image reconstructions were also generated. CONTRAST:  75mL ISOVUE-300 IOPAMIDOL (ISOVUE-300) INJECTION 61% COMPARISON:  Head CT 06/28/2016 FINDINGS: Osseous: Periapical erosions around teeth 23, 24, and 29. The latter periapical erosion extends through the buccal cortex and there is regional soft tissue inflammation. No drainable collection. Cervical facet spurring. Orbits: Bilateral cataract resection.  No acute finding. Sinuses: Clear of fluid levels. There is mild mucosal thickening on the floors of the maxillary sinuses, possible retention cysts. Soft tissues: Cellulitic changes around the right mandible as noted above. No floor of mouth inflammation. Atherosclerotic calcification. Limited intracranial: Negative IMPRESSION: Odontogenic soft tissue infection related to tooth 29. Negative for  abscess. Electronically Signed   By: Marnee SpringJonathon  Watts M.D.   On: 05/29/2017 07:26   Dg Chest Port 1 View  Result Date: 05/29/2017 CLINICAL DATA:  Acute onset of fever, cough and vomiting. EXAM: PORTABLE CHEST 1 VIEW COMPARISON:  Chest radiograph performed 04/19/2017 FINDINGS: The lungs are well-aerated and clear. There is no evidence of focal opacification, pleural effusion or pneumothorax. The cardiomediastinal silhouette is borderline normal in size. The patient is status post median sternotomy, with evidence of prior CABG. No acute osseous abnormalities are seen. IMPRESSION: No acute cardiopulmonary process seen. Electronically Signed   By: Roanna Raider M.D.   On: 05/29/2017 06:04    EKG: Independently reviewed.  Atrial fibrillation with a left bundle branch block, no significant change from prior tracings  Assessment/Plan    1. Sepsis, toxic encephalopathy due to acute right-sided dental infection.  Patient noted to have swelling of the right mandible area.  CT confirms dental infection without any drainable abscess.  On IV Unasyn, infection and sepsis physiology seems to have improved, still is encephalopathic, will try to minimize narcotics and benzodiazepines, will require outpatient dental follow-up. 2. Paroxysmal atrial fibrillation.  Chads vas 2 score of at least 3-  continue on rate control with nebivolol and anticoagulation with Eliquis. 3. Chronic diastolic congestive heart failure.  EF on recent echo 55%.  Appears stable at this time.  Continue home dose of Lasix.  Weight appears to be near baseline.  Continue Imdur 4. GERD.  Continue on PPI 5. Hyperlipidemia.  Continue on statin. 6. DM type II.  Currently due to encephalopathy oral intake is poor, have cut glipizide into half home dose and skip today's dose, Lantus cut into half, hold nighttime sliding scale, nursing staff want to monitor for hypoglycemia, will monitor closely. 7. Hypertension.  Continue on Bystolic,  losartan 8. Severe toxic encephalopathy with delirium.  Currently extremely somnolent due to Ativan and Haldol, discontinue Ativan, only Haldol as needed. 9. Urinary retention.  Had Foley put in yesterday.  Add Flomax and monitor.    DVT prophylaxis: Eliquis Code Status: DNR Family Communication: Discussed with wife at the bedside Disposition Plan: TBD, may require SNF Consults called: None Admission status: observation, medsurg   Signature  Susa Raring M.D on 05/30/2017 at 9:00 AM  Between 7am to 7pm - Pager - [phone] ( page via [email or web address], text pages only, please mention full 10 digit call back number).  After 7pm go to [email or web address] - password [facility]

## 2017-05-30 NOTE — Evaluation (Addendum)
Physical Therapy Evaluation Patient Details Name: Luis Yu MRN: [id number] DOB: [birth year] Today's Date: 05/30/2017   History of Present Illness  Luis Yu is a 72 y.o. male with medical history significant of paroxysmal atrial fibrillation on Eliquis, chronic diastolic congestive heart failure, recurrent dental infections, presented to the hospital with fever.  Patient has had a prior stroke and has some residual cognitive effects.  History is mostly obtained from his wife.  She reports that he was in his usual state of health yesterday.  This morning he woke up confused, having chills.  He was noted to be trembling and shaking.  He began coughing.  No vomiting or diarrhea.  He was noted to be lethargic/confused.  He was brought to the emergency room for evaluation where he was noted to be mildly hypotensive/tachycardic.  He received IV fluids with correction of hemodynamics.  He was noted to be febrile with a temperature of 102.  He received Tylenol with improvement of fever.  Imaging has shown evidence of right sided dental infection.  Urinalysis and chest x-Prieto are unrevealing.  Blood cultures have been sent.  Influenza panel is negative.  Patient is been referred for admission.    Clinical Impression  Patient presents with his spouse, sitter and nursing students in room.  Patient appears confused, agitated, and very restless, able to answer some questions, required 2 person assist to sit up at bedside, unable to sit to stand due to lethargy and poor carryover for following instructions.  Patient put back to bed with 2 person assist.  Patient will benefit from continued physical therapy in hospital and recommended venue below to increase strength, balance, endurance for safe ADLs and gait.    Follow Up Recommendations SNF;Supervision/Assistance - 24 hour    Equipment Recommendations  None recommended by PT    Recommendations for Other Services       Precautions / Restrictions  Precautions Precautions: Fall Restrictions Weight Bearing Restrictions: No      Mobility  Bed Mobility Overal bed mobility: Needs Assistance Bed Mobility: Supine to Sit;Sit to Supine     Supine to sit: Max assist;+2 for physical assistance Sit to supine: Max assist;+2 for physical assistance   General bed mobility comments: requires constant verbal/tactile cueing to sit up  Transfers                    Ambulation/Gait                Stairs            Wheelchair Mobility    Modified Rankin (Stroke Patients Only)       Balance Overall balance assessment: Needs assistance Sitting-balance support: Feet supported;No upper extremity supported Sitting balance-Leahy Scale: Poor Sitting balance - Comments: fair/poor Postural control: Posterior lean                                   Pertinent Vitals/Pain Pain Assessment: No/denies pain    Home Living Family/patient expects to be discharged to:: Private residence Living Arrangements: Spouse/significant other Available Help at Discharge: Family;Available 24 hours/day Type of Home: House Home Access: Stairs to enter Entrance Stairs-Rails: Can reach both;Right;Left Entrance Stairs-Number of Steps: 3 Home Layout: One level Home Equipment: Walker - 4 wheels;Shower seat      Prior Function Level of Independence: Independent with assistive device(s)         Comments: ambulates  household distances with Rollator     Hand Dominance   Dominant Hand: Right    Extremity/Trunk Assessment   Upper Extremity Assessment Upper Extremity Assessment: Generalized weakness    Lower Extremity Assessment Lower Extremity Assessment: Generalized weakness    Cervical / Trunk Assessment Cervical / Trunk Assessment: Normal  Communication   Communication: No difficulties  Cognition Arousal/Alertness: Awake/alert Behavior During Therapy: Restless;Impulsive;Anxious Overall Cognitive Status:  Impaired/Different from baseline Area of Impairment: Following commands;Safety/judgement                       Following Commands: Follows one step commands inconsistently       General Comments: presents confused trying to climb out of bed requiring sitter in room      General Comments      Exercises     Assessment/Plan    PT Assessment Patient needs continued PT services  PT Problem List Decreased strength;Decreased activity tolerance;Decreased balance;Decreased mobility       PT Treatment Interventions Gait training;Functional mobility training;Therapeutic activities;Therapeutic exercise;Stair training;Patient/family education    PT Goals (Current goals can be found in the Care Plan section)  Acute Rehab PT Goals Patient Stated Goal: return home PT Goal Formulation: With patient/family Time For Goal Achievement: 06/13/17 Potential to Achieve Goals: Fair    Frequency Min 3X/week   Barriers to discharge        Co-evaluation               AM-PAC PT "6 Clicks" Daily Activity  Outcome Measure Difficulty turning over in bed (including adjusting bedclothes, sheets and blankets)?: A Lot Difficulty moving from lying on back to sitting on the side of the bed? : A Lot Difficulty sitting down on and standing up from a chair with arms (e.g., wheelchair, bedside commode, etc,.)?: Unable Help needed moving to and from a bed to chair (including a wheelchair)?: Total Help needed walking in hospital room?: Total Help needed climbing 3-5 steps with a railing? : Total 6 Click Score: 8    End of Session   Activity Tolerance: Treatment limited secondary to agitation;Patient limited by fatigue;Patient limited by lethargy Patient left: in bed;with call bell/phone within reach;with family/visitor present;with nursing/sitter in room Nurse Communication: Mobility status PT Visit Diagnosis: Unsteadiness on feet (R26.81);Other abnormalities of gait and mobility  (R26.89);Muscle weakness (generalized) (M62.81)    Time: [id number] PT Time Calculation (min) (ACUTE ONLY): 23 min   Charges:   PT Evaluation $PT Eval High Complexity: 1 High PT Treatments $Therapeutic Activity: 23-37 mins   PT G Codes:        2:19 PM, 05/30/17 Ocie BobJames Aerabella Galasso, MPT Physical Therapist [facility] 336 [phone] office [phone] mobile phone

## 2017-05-30 NOTE — Progress Notes (Signed)
Patient was restless and agitated previous RN gave PRN dose of ativan at 1900. Patient was still restless and trying to get out of bed and pull out foley catheter. Have sitter at bedside to try and redirect patient. At  2330 patient was getting more agitated paged Mid-level on call and got one time dose of Haldol at 2342. Patient was still restless after does and was up all night. Gave another does of ativan at 0329 but patient unchanged. Sitter at bedside will continue to try and redirect patient and continue to monitor.

## 2017-05-31 ENCOUNTER — Encounter (HOSPITAL_COMMUNITY): Payer: Self-pay

## 2017-05-31 ENCOUNTER — Inpatient Hospital Stay (HOSPITAL_COMMUNITY): Payer: Medicare Other

## 2017-05-31 DIAGNOSIS — R509 Fever, unspecified: Secondary | ICD-10-CM

## 2017-05-31 DIAGNOSIS — I482 Chronic atrial fibrillation: Secondary | ICD-10-CM

## 2017-05-31 DIAGNOSIS — A419 Sepsis, unspecified organism: Principal | ICD-10-CM

## 2017-05-31 LAB — URINE CULTURE: Culture: NO GROWTH

## 2017-05-31 LAB — BASIC METABOLIC PANEL
Anion gap: 8 (ref 5–15)
BUN: 32 mg/dL — ABNORMAL HIGH (ref 6–20)
CO2: 29 mmol/L (ref 22–32)
Calcium: 8.8 mg/dL — ABNORMAL LOW (ref 8.9–10.3)
Chloride: 106 mmol/L (ref 101–111)
Creatinine, Ser: 1.23 mg/dL (ref 0.61–1.24)
GFR calc Af Amer: >60 mL/min (ref >60)
GFR calc non Af Amer: 57 mL/min — ABNORMAL LOW (ref >60)
Glucose, Bld: 132 mg/dL — ABNORMAL HIGH (ref 65–99)
Potassium: 3.7 mmol/L (ref 3.5–5.1)
Sodium: 143 mmol/L (ref 135–145)

## 2017-05-31 LAB — CBC
HCT: 30.5 % — ABNORMAL LOW (ref 39.0–52.0)
Hemoglobin: 9.1 g/dL — ABNORMAL LOW (ref 13.0–17.0)
MCH: 30.4 pg (ref 26.0–34.0)
MCHC: 29.8 g/dL — ABNORMAL LOW (ref 30.0–36.0)
MCV: 102 fL — ABNORMAL HIGH (ref 78.0–100.0)
Platelets: 135 10*3/uL — ABNORMAL LOW (ref 150–400)
RBC: 2.99 MIL/uL — ABNORMAL LOW (ref 4.22–5.81)
RDW: 18.6 % — ABNORMAL HIGH (ref 11.5–15.5)
WBC: 7.7 10*3/uL (ref 4.0–10.5)

## 2017-05-31 LAB — GLUCOSE, CAPILLARY
Glucose-Capillary: 118 mg/dL — ABNORMAL HIGH (ref 65–99)
Glucose-Capillary: 163 mg/dL — ABNORMAL HIGH (ref 65–99)
Glucose-Capillary: 177 mg/dL — ABNORMAL HIGH (ref 65–99)

## 2017-05-31 MED ORDER — PIPERACILLIN-TAZOBACTAM 3.375 G IVPB
3.3750 g | Freq: Three times a day (TID) | INTRAVENOUS | Status: DC
  Administered 2017-05-31 – 2017-06-03 (×8): 3.375 g via INTRAVENOUS
  Filled 2017-05-31 (×8): qty 50

## 2017-05-31 MED ORDER — IOPAMIDOL (ISOVUE-300) INJECTION 61%
100.0000 mL | Freq: Once | INTRAVENOUS | Status: AC | PRN
  Administered 2017-05-31: 75 mL via INTRAVENOUS

## 2017-05-31 MED ORDER — VANCOMYCIN HCL 10 G IV SOLR
2000.0000 mg | Freq: Once | INTRAVENOUS | Status: AC
  Administered 2017-05-31: 2000 mg via INTRAVENOUS
  Filled 2017-05-31: qty 2000

## 2017-05-31 MED ORDER — VANCOMYCIN HCL IN DEXTROSE 750-5 MG/150ML-% IV SOLN
750.0000 mg | Freq: Two times a day (BID) | INTRAVENOUS | Status: DC
  Administered 2017-06-01 – 2017-06-03 (×5): 750 mg via INTRAVENOUS
  Filled 2017-05-31 (×8): qty 150

## 2017-05-31 MED ORDER — VANCOMYCIN HCL 1000 MG IV SOLR
INTRAVENOUS | Status: AC
  Filled 2017-05-31: qty 2000

## 2017-05-31 NOTE — [place] FL2 (Signed)
[place] MEDICAID FL2 LEVEL OF CARE SCREENING TOOL     IDENTIFICATION  Patient Name: Luis Yu Birthdate: 26-Dec-1945 Sex: male Admission Date (Current Location): 05/29/2017  [facility] and [place] Number:  [employer] and Address:         Provider Number: [id number]  Attending Physician Name and Address:  Erick Blinks, MD  Relative Name and Phone Number:       Current Level of Care: Hospital Recommended Level of Care: Skilled Nursing Facility Prior Approval Number:    Date Approved/Denied:   PASRR Number:    Discharge Plan: SNF    Current Diagnoses: Patient Active Problem List   Diagnosis Date Noted  . Dental infection 05/29/2017  . Chronic diastolic CHF (congestive heart failure) (HCC) 05/29/2017  . Sleep apnea   . Postherpetic neuralgia   . Osteoarthritis   . Obesity   . Hypertension   . GERD (gastroesophageal reflux disease)   . Erectile dysfunction   . Diabetic peripheral neuropathy (HCC)   . Diabetes mellitus type II, uncontrolled (HCC)   . Depression   . CAD (coronary artery disease)   . Atrial flutter (HCC)   . Adjustment disorder with mixed emotional features   . Frontal lobe and executive function deficit following cerebral infarction 07/02/2016  . Neurologic gait disorder   . Coronary artery disease involving coronary bypass graft of native heart without angina pectoris   . Acute kidney injury (HCC)   . Nausea & vomiting 06/28/2016  . Ischemic stroke of frontal lobe (HCC) 06/28/2016  . Thrombocytopenia (HCC) 06/28/2016  . Renal failure (ARF), acute on chronic (HCC) 06/28/2016  . Stroke (cerebrum) (HCC) 06/28/2016  . Acute CVA (cerebrovascular accident) (HCC) 06/28/2016  . Insomnia 02/02/2016  . Atrial fibrillation (HCC) 10/27/2015  . Uncoordinated movements 02/24/2014  . Unspecified hereditary and idiopathic peripheral neuropathy 02/24/2013  . Low back pain 02/24/2013  . Abnormality of gait 02/09/2013  . Memory loss, short  term 12/05/2011  . LUMBAR STRAIN, ACUTE 05/05/2010  . OSA (obstructive sleep apnea) 09/14/2009  . BPH (benign prostatic hyperplasia) 07/27/2009  . CARDIOVASCULAR STUDIES, ABNORMAL 07/27/2009  . CHEST PAIN 06/08/2009  . OSTEOARTHRITIS 05/30/2009  . HEEL PAIN, LEFT 05/24/2009  . Dyspnea on exertion 05/24/2009  . ADJUSTMENT DISORDER WITH MIXED FEATURES 08/25/2008  . Diabetes type 2, uncontrolled (HCC) 02/24/2008  . ERECTILE DYSFUNCTION 02/10/2007  . Hyperlipidemia 10/09/2006  . OBESITY 10/09/2006  . Essential hypertension 10/09/2006  . Coronary atherosclerosis 10/09/2006  . GERD 10/09/2006  . DM (diabetes mellitus) type II uncontrolled with retinopathy 06/10/2006    Orientation RESPIRATION BLADDER Height & Weight     Place  O2(5L) Continent Weight: 207 lb 1.6 oz (93.9 kg) Height:   (188 cm)  BEHAVIORAL SYMPTOMS/MOOD NEUROLOGICAL BOWEL NUTRITION STATUS      Continent Diet(see discharge summary)  AMBULATORY STATUS COMMUNICATION OF NEEDS Skin   Extensive Assist Verbally Normal                       Personal Care Assistance Level of Assistance  Bathing, Feeding, Dressing Bathing Assistance: Maximum assistance Feeding assistance: Limited assistance Dressing Assistance: Maximum assistance     Functional Limitations Info  Sight, Hearing, Speech Sight Info: Adequate Hearing Info: Adequate Speech Info: Adequate    SPECIAL CARE FACTORS FREQUENCY  PT (By licensed PT)     PT Frequency: 5x/week              Contractures Contractures Info: Not present  Additional Factors Info  Code Status, Allergies, Psychotropic Code Status Info: DNR Allergies Info: NKA Psychotropic Info: Wellbutrin, Remeron         Current Medications (05/31/2017):  This is the current hospital active medication list Current Facility-Administered Medications  Medication Dose Route Frequency Provider Last Rate Last Dose  . acetaminophen (TYLENOL) tablet 650 mg  650 mg Oral Q6H PRN  Erick Blinks, MD   650 mg at 05/30/17 2209   Or  . acetaminophen (TYLENOL) suppository 650 mg  650 mg Rectal Q6H PRN Erick Blinks, MD      . Ampicillin-Sulbactam (UNASYN) 3 g in sodium chloride 0.9 % 100 mL IVPB  3 g Intravenous Q6H Memon, Durward Mallard, MD 200 mL/hr at 05/31/17 1233 3 g at 05/31/17 1233  . apixaban (ELIQUIS) tablet 5 mg  5 mg Oral BID Erick Blinks, MD   5 mg at 05/31/17 0848  . atorvastatin (LIPITOR) tablet 80 mg  80 mg Oral QHS Erick Blinks, MD   80 mg at 05/30/17 2145  . buPROPion (WELLBUTRIN SR) 12 hr tablet 150 mg  150 mg Oral BID Erick Blinks, MD   150 mg at 05/31/17 0847  . furosemide (LASIX) tablet 40 mg  40 mg Oral Daily Erick Blinks, MD   40 mg at 05/31/17 0847  . glipiZIDE (GLUCOTROL) tablet 5 mg  5 mg Oral QAC breakfast Leroy Sea, MD   5 mg at 05/31/17 0848  . guaiFENesin (MUCINEX) 12 hr tablet 1,200 mg  1,200 mg Oral BID Erick Blinks, MD   1,200 mg at 05/31/17 0847  . insulin aspart (novoLOG) injection 0-15 Units  0-15 Units Subcutaneous TID WC Erick Blinks, MD   3 Units at 05/31/17 1235  . insulin glargine (LANTUS) injection 10 Units  10 Units Subcutaneous QHS Leroy Sea, MD   10 Units at 05/30/17 2146  . ipratropium-albuterol (DUONEB) 0.5-2.5 (3) MG/3ML nebulizer solution 3 mL  3 mL Nebulization Q6H PRN Leroy Sea, MD      . isosorbide mononitrate (IMDUR) 24 hr tablet 30 mg  30 mg Oral Daily Erick Blinks, MD   30 mg at 05/31/17 0846  . LORazepam (ATIVAN) injection 1 mg  1 mg Intravenous Q6H PRN Erick Blinks, MD   1 mg at 05/31/17 0852  . losartan (COZAAR) tablet 50 mg  50 mg Oral Daily Erick Blinks, MD   50 mg at 05/31/17 0847  . mirtazapine (REMERON) tablet 15 mg  15 mg Oral QHS Erick Blinks, MD   15 mg at 05/30/17 2146  . nebivolol (BYSTOLIC) tablet 20 mg  20 mg Oral QHS Erick Blinks, MD   20 mg at 05/30/17 2145  . ondansetron (ZOFRAN) injection 4 mg  4 mg Intravenous Q6H PRN Erick Blinks, MD      .  pantoprazole (PROTONIX) EC tablet 40 mg  40 mg Oral BID AC Erick Blinks, MD   40 mg at 05/31/17 0846  . tamsulosin (FLOMAX) capsule 0.4 mg  0.4 mg Oral Daily Leroy Sea, MD   0.4 mg at 05/31/17 0846  . thiamine (VITAMIN B-1) tablet 100 mg  100 mg Oral Daily Erick Blinks, MD   100 mg at 05/31/17 0846  . vitamin B-12 (CYANOCOBALAMIN) tablet 2,000 mcg  2,000 mcg Oral QHS Erick Blinks, MD   2,000 mcg at 05/30/17 2145     Discharge Medications: Please see discharge summary for a list of discharge medications.  Relevant Imaging Results:  Relevant Lab Results:   Additional Information SSN  [id number]  Verle Wheeling D, LCSW

## 2017-05-31 NOTE — Care Management Important Message (Signed)
Important Message  Patient Details  Name: Doretha ImusDonald W Viverette MRN: [id number] Date of Birth: Jul 05, 1945   Medicare Important Message Given:  Yes    Malcolm MetroChildress, Aaryn Parrilla Demske, RN 05/31/2017, 12:54 PM

## 2017-05-31 NOTE — Progress Notes (Addendum)
Pharmacy Antibiotic Note  Luis Yu is a 72 y.o. male admitted on 05/29/2017 with pneumonia and persistent fever, and dental infection.  Pharmacy has been consulted for Vancomycin and Zosyn dosing.  Plan: Vancomycin 2000 mg IV x 1 dose Vancomycin 750 mg IV every 12 hours.  Goal trough 15-20 mcg/mL. Zosyn 3.375g IV q8h (4 hour infusion).  Monitor labs, cultures, and vanco trough as needed.  Height: 6\' 2"  (188 cm) Weight: 207 lb 1.6 oz (93.9 kg) IBW/kg (Calculated) : 82.2  Temp (24hrs), Avg:99.2 F (37.3 C), Min:96.4 F (35.8 C), Max:101.5 F (38.6 C)  Recent Labs  Lab 05/29/17 0538 05/29/17 0545 05/29/17 0748 05/30/17 0401 05/31/17 0738  WBC 11.1*  --   --  9.8 7.7  CREATININE 1.23  --   --  1.28* 1.23  LATICACIDVEN  --  1.71 1.36  --   --     Estimated Creatinine Clearance: 63.1 mL/min (by C-G formula based on SCr of 1.23 mg/dL).    No Known Allergies  Antimicrobials this admission: Vanco 3/15 >>  Zosyn 3/15 >>  Unasyn 3/13 >>3/15  Dose adjustments this admission: N/A  Microbiology results: 3/13 BCx: NG x 2 days 3/13 UCx: NG    Thank you for allowing pharmacy to be a part of this patient's care.  Judeth CornfieldSteven Syna Gad, PharmD Clinical Pharmacist 05/31/2017 4:36 PM

## 2017-05-31 NOTE — Progress Notes (Signed)
Progress note:  Subjective Patient continues to have productive cough.  Overall facial swelling is better.  Still having some fevers.  Had some agitation, but this is doing better today.  Past Medical History:  Diagnosis Date  . Adjustment disorder with mixed emotional features   . Atrial flutter (HCC)   . CAD (coronary artery disease)   . Depression   . Diabetes mellitus type II, uncontrolled (HCC)   . Diabetic peripheral neuropathy (HCC)   . Erectile dysfunction   . GERD (gastroesophageal reflux disease)   . Herpes zoster   . Hyperlipidemia   . Hypertension   . Obesity   . Osteoarthritis   . Postherpetic neuralgia   . Sleep apnea     Past Surgical History:  Procedure Laterality Date  . CARDIOVERSION N/A 11/10/2015   Procedure: CARDIOVERSION;  Surgeon: Laurey Morale, MD;  Location: [facility] ENDOSCOPY;  Service: Cardiovascular;  Laterality: N/A;  . CARDIOVERSION N/A 02/21/2016   Procedure: CARDIOVERSION;  Surgeon: Lewayne Bunting, MD;  Location: [facility] ENDOSCOPY;  Service: Cardiovascular;  Laterality: N/A;  . CORONARY ARTERY BYPASS GRAFT  1996   eith a LIMA to the LAD, saphenous vein graft to the acute marginal, saphenous vein graft to the PDA and saphenous vein graft to the circumflex.  [name] KNEE ARTHROSCOPY  05/2004   right knee  . REPLACEMENT TOTAL KNEE BILATERAL    . SHOULDER SURGERY       reports that  has never smoked. he has never used smokeless tobacco. He reports that he drinks about 1.2 oz of alcohol per week. He reports that he does not use drugs.  No Known Allergies  Family History  Problem Relation Age of Onset  . Coronary artery disease Mother        deceased at 24  . Coronary artery disease Father        died age 3  . Stroke Unknown        half brother  . Stroke Brother     Prior to Admission medications   Medication Sig Start Date End Date Taking? Authorizing Provider  acetaminophen (TYLENOL) 500 MG tablet Take 2 tablets (1,000 mg total) by mouth every  6 (six) hours as needed. Patient taking differently: Take 1,000 mg by mouth every 6 (six) hours as needed for mild pain.  05/24/16   Arby Barrette, MD  apixaban (ELIQUIS) 5 MG TABS tablet Take 1 tablet (5 mg total) by mouth 2 (two) times daily. 08/09/16   Nafziger, Kandee Keen, NP  atorvastatin (LIPITOR) 80 MG tablet Take 1 tablet (80 mg total) by mouth daily. 04/03/17   Nafziger, Kandee Keen, NP  buPROPion (WELLBUTRIN SR) 150 MG 12 hr tablet Take 1 tablet (150 mg total) by mouth 2 (two) times daily. 04/02/17   Nafziger, Kandee Keen, NP  doxycycline (VIBRA-TABS) 100 MG tablet Take 1 tablet (100 mg total) by mouth 2 (two) times daily. 05/01/17   Nafziger, Kandee Keen, NP  furosemide (LASIX) 20 MG tablet Take 2 tablets (40 mg total) by mouth daily. 04/24/17 07/23/17  Lewayne Bunting, MD  glipiZIDE (GLUCOTROL) 10 MG tablet Take 10 mg by mouth daily. 07/14/16   [provider]  glucose blood (ACCU-CHEK ACTIVE STRIPS) test strip Use as instructed 03/29/16   Nafziger, Kandee Keen, NP  HYDROcodone-homatropine (HYCODAN) 5-1.5 MG/5ML syrup Take 5 mLs by mouth every 8 (eight) hours as needed for cough. 04/05/17   Nafziger, Kandee Keen, NP  Insulin Glargine (BASAGLAR KWIKPEN) 100 UNIT/ML SOPN Inject 0.25 mLs (25 Units total) into  the skin at bedtime. Patient taking differently: Inject 20 Units into the skin at bedtime.  07/06/16   Angiulli, Mcarthur Rossettianiel J, PA-C  Insulin Pen Needle (PEN NEEDLES 31GX5/16") 31G X 8 MM MISC Use to inject insulin 5 times a day. 07/06/16   Angiulli, Mcarthur Rossettianiel J, PA-C  isosorbide mononitrate (IMDUR) 30 MG 24 hr tablet Take 1 tablet (30 mg total) by mouth daily. 08/09/16   Nafziger, Kandee Keenory, NP  Lancets (ACCU-CHEK MULTICLIX) lancets Use up to 5 times a day to check blood sugar. 07/06/16   Angiulli, Mcarthur Rossettianiel J, PA-C  losartan (COZAAR) 50 MG tablet Take 1 tablet (50 mg total) by mouth daily. 05/02/17 04/27/18  Azalee CourseMeng, Hao, PA  metFORMIN (GLUCOPHAGE) 1000 MG tablet TAKE 1 TABLET (1,000 MG TOTAL) BY MOUTH 2 (TWO) TIMES DAILY WITH A MEAL. 12/12/16    Nafziger, Kandee Keenory, NP  mirtazapine (REMERON) 15 MG tablet Take 15 mg by mouth at bedtime.    [provider]  Nebivolol HCl (BYSTOLIC) 20 MG TABS Take 1 tablet by mouth daily.    [provider]  omeprazole (PRILOSEC) 20 MG capsule TAKE ONE CAPSULE TWICE A DAY BEFORE A MEAL 05/21/17   Nafziger, Kandee Keenory, NP  thiamine 100 MG tablet Take 1 tablet (100 mg total) by mouth daily. 08/09/16   Nafziger, Kandee Keenory, NP  vitamin B-12 (CYANOCOBALAMIN) 1000 MCG tablet Take 2 tablets (2,000 mcg total) by mouth daily. 08/09/16   Shirline FreesNafziger, [name], NP    Physical Exam:  Vitals:   05/30/17 2203 05/31/17 0921 05/31/17 1500 05/31/17 2006  BP:  122/73 109/61 (!) 130/58  Pulse:  80 73 64  Resp:  (!) 22 (!) 22 (!) 24  Temp: (!) 101.5 F (38.6 C) (!) 96.4 F (35.8 C) 99.2 F (37.3 C) 99.2 F (37.3 C)  TempSrc: Rectal Axillary Oral Oral  SpO2:  100% 100% 100%  Weight:      Height:        Exam  General exam: Not in any distress. Respiratory system: Clear to auscultation. Respiratory effort normal. Cardiovascular system:RRR. No murmurs, rubs, gallops. Gastrointestinal system: Abdomen is nondistended, soft and nontender. No organomegaly or masses felt. Normal bowel sounds heard. Central nervous system: No gross motor deficits.. Extremities: No C/C/E, +pedal pulses Skin: No rashes, lesions or ulcers Psychiatry: Mildly confused, but otherwise pleasant.     Labs on Admission: I have personally reviewed following labs and imaging studies  CBC: Recent Labs  Lab 05/29/17 0538 05/30/17 0401 05/31/17 0738  WBC 11.1* 9.8 7.7  NEUTROABS 8.6*  --   --   HGB 10.1* 9.3* 9.1*  HCT 33.8* 31.3* 30.5*  MCV 100.9* 101.3* 102.0*  PLT 199 161 135*   Basic Metabolic Panel: Recent Labs  Lab 05/29/17 0538 05/30/17 0401 05/31/17 0738  NA 141 141 143  K 4.5 4.0 3.7  CL 106 107 106  CO2 23 23 29   GLUCOSE 108* 113* 132*  BUN 33* 31* 32*  CREATININE 1.23 1.28* 1.23  CALCIUM 9.3 9.0 8.8*    GFR: Estimated Creatinine Clearance: 63.1 mL/min (by C-G formula based on SCr of 1.23 mg/dL). Liver Function Tests: Recent Labs  Lab 05/29/17 0538  AST 24  ALT 20  ALKPHOS 126  BILITOT 1.7*  PROT 6.6  ALBUMIN 3.6   No results for input(s): LIPASE, AMYLASE in the last 168 hours. No results for input(s): AMMONIA in the last 168 hours. Coagulation Profile: No results for input(s): INR, PROTIME in the last 168 hours. Cardiac Enzymes: No results for input(s):  CKTOTAL, CKMB, CKMBINDEX, TROPONINI in the last 168 hours. BNP (last 3 results) No results for input(s): PROBNP in the last 8760 hours. HbA1C: No results for input(s): HGBA1C in the last 72 hours. CBG: Recent Labs  Lab 05/30/17 1107 05/30/17 1659 05/30/17 2008 05/31/17 0728 05/31/17 1145  GLUCAP 116* 155* 182* 118* 163*   Lipid Profile: No results for input(s): CHOL, HDL, LDLCALC, TRIG, CHOLHDL, LDLDIRECT in the last 72 hours. Thyroid Function Tests: No results for input(s): TSH, T4TOTAL, FREET4, T3FREE, THYROIDAB in the last 72 hours. Anemia Panel: No results for input(s): VITAMINB12, FOLATE, FERRITIN, TIBC, IRON, RETICCTPCT in the last 72 hours. Urine analysis:    Component Value Date/Time   COLORURINE AMBER (A) 05/29/2017 0523   APPEARANCEUR HAZY (A) 05/29/2017 0523   LABSPEC 1.020 05/29/2017 0523   PHURINE 5.0 05/29/2017 0523   GLUCOSEU NEGATIVE 05/29/2017 0523   GLUCOSEU NEGATIVE 05/27/2007 0844   HGBUR NEGATIVE 05/29/2017 0523   HGBUR negative 05/05/2010 1041   BILIRUBINUR NEGATIVE 05/29/2017 0523   KETONESUR NEGATIVE 05/29/2017 0523   PROTEINUR 30 (A) 05/29/2017 0523   UROBILINOGEN 0.2 05/05/2010 1041   NITRITE NEGATIVE 05/29/2017 0523   LEUKOCYTESUR NEGATIVE 05/29/2017 0523    Radiological Exams on Admission: Ct Chest W Contrast  Result Date: 05/31/2017 CLINICAL DATA:  Inpatient.  Altered mental status.  Dyspnea. EXAM: CT CHEST WITH CONTRAST TECHNIQUE: Multidetector CT imaging of the chest was  performed during intravenous contrast administration. CONTRAST:  75mL ISOVUE-300 IOPAMIDOL (ISOVUE-300) INJECTION 61% COMPARISON:  05/29/2017 chest radiograph.  05/24/2016 chest CT. FINDINGS: Cardiovascular: Borderline mildly enlarged heart. No significant pericardial fluid/thickening. Left main and 3 vessel coronary atherosclerosis status post CABG. Atherosclerotic nonaneurysmal thoracic aorta. Stable dilated main pulmonary artery (3.6 cm diameter). No central pulmonary emboli. Mediastinum/Nodes: No discrete thyroid nodules. Unremarkable esophagus. No pathologically enlarged axillary, mediastinal or hilar lymph nodes. Lungs/Pleura: No pneumothorax. Small dependent bilateral pleural effusions, left greater than right. Patchy consolidation and ground-glass attenuation throughout the dependent left lower lobe. Interlobular septal thickening throughout both lungs. Scattered tiny granulomas in the right lung are stable. No new significant pulmonary nodules. Upper abdomen: Exophytic simple 1.4 cm anterior upper right renal cyst. Musculoskeletal: No aggressive appearing focal osseous lesions. Stable configuration of median sternotomy wires, several of which demonstrate discontinuities, unchanged. Moderate thoracic spondylosis. IMPRESSION: 1. Patchy consolidation and ground-glass attenuation in dependent left lower lobe, suggesting aspiration or pneumonia. 2. Spectrum of findings suggestive of mild congestive heart failure, with borderline mild cardiomegaly, small dependent bilateral pleural effusions and interlobular septal thickening compatible with pulmonary edema. Aortic Atherosclerosis (ICD10-I70.0). Electronically Signed   By: Delbert Phenix M.D.   On: 05/31/2017 19:31    EKG: Independently reviewed.  Atrial fibrillation with a left bundle branch block, no significant change from prior tracings  Assessment/Plan    1. Sepsis, toxic encephalopathy due to acute right-sided dental infection.  Patient noted to have  swelling of the right mandible area.  CT confirms dental infection without any drainable abscess.  On IV Unasyn.  Patient continues to have significant fevers.  He is also having a significant cough and has an oxygen requirement.  Wife reports that he often becomes "choked up" when he eats or drinks.  Will check CT of the chest to evaluate for any developing pneumonia.  Swallow evaluation to check for aspiration.  Broaden antibiotic coverage from Unasyn to vancomycin and Zosyn. 2. Paroxysmal atrial fibrillation.  Chads vas 2 score of at least 3-  continue on rate control with nebivolol and anticoagulation  with Eliquis. 3. Chronic diastolic congestive heart failure.  EF on recent echo 55%.  Appears stable at this time.  Continue home dose of Lasix.  Weight appears to be near baseline.  Continue Imdur 4. GERD.  Continue on PPI 5. Hyperlipidemia.  Continue on statin. 6. DM type II.  Glipizide and Lantus dose has been adjusted.  Blood sugars are currently stable.  Continue to monitor. 7. Hypertension.  Continue on Bystolic, losartan 8. Severe toxic encephalopathy with delirium.  Currently extremely somnolent due to Ativan and Haldol, discontinue Ativan, only Haldol as needed. 9. Urinary retention.  Patient required Foley catheter placement.  Flomax has been added.  Will need to attempt voiding trial prior to discharge.    DVT prophylaxis: Eliquis Code Status: DNR Family Communication: Discussed with wife at the bedside Disposition Plan: Healed nursing facility placement on discharge Consults called: None Admission status: Inpatient, MedSurg   Signature  Erick Blinks M.D on 05/31/2017 at 8:19 PM  Between 7am to 7pm - Pager - [phone] ( page via [email or web address], text pages only, please mention full 10 digit call back number).  After 7pm go to [email or web address] - password [facility]

## 2017-05-31 NOTE — Plan of Care (Signed)
progressing 

## 2017-05-31 NOTE — Progress Notes (Signed)
Physical Therapy Treatment Patient Details Name: Luis ImusDonald W Cardarelli MRN: [id number] DOB: December 12, 1945 Today's Date: 05/31/2017    History of Present Illness Luis Yu is a 72 y.o. male with medical history significant of paroxysmal atrial fibrillation on Eliquis, chronic diastolic congestive heart failure, recurrent dental infections, presented to the hospital with fever.  Patient has had a prior stroke and has some residual cognitive effects.  History is mostly obtained from his wife.  She reports that he was in his usual state of health yesterday.  This morning he woke up confused, having chills.  He was noted to be trembling and shaking.  He began coughing.  No vomiting or diarrhea.  He was noted to be lethargic/confused.  He was brought to the emergency room for evaluation where he was noted to be mildly hypotensive/tachycardic.  He received IV fluids with correction of hemodynamics.  He was noted to be febrile with a temperature of 102.  He received Tylenol with improvement of fever.  Imaging has shown evidence of right sided dental infection.  Urinalysis and chest x-Phaneuf are unrevealing.  Blood cultures have been sent.  Influenza panel is negative.  Patient is been referred for admission.    PT Comments    Patient more alert and cooperative with therapy, tolerated sitting up at bedside and able to transfer to recliner chair, frequent leaning backwards when sitting unsupported mostly due to fatigue, tolerated standing with RW for up to 6-7 minutes, once fatigued he leans over RW and has to sit.  Patient tolerated staying up in chair with sitter in room after therapy.  Patient will benefit from continued physical therapy in hospital and recommended venue below to increase strength, balance, endurance for safe ADLs and gait.    Follow Up Recommendations  SNF;Supervision/Assistance - 24 hour     Equipment Recommendations  None recommended by PT    Recommendations for Other Services        Precautions / Restrictions Precautions Precautions: Fall Restrictions Weight Bearing Restrictions: No    Mobility  Bed Mobility Overal bed mobility: Needs Assistance Bed Mobility: Supine to Sit     Supine to sit: Mod assist        Transfers Overall transfer level: Needs assistance Equipment used: Rolling walker (2 wheeled) Transfers: Sit to/from [employer] CorporationStand;Stand Pivot Transfers Sit to Stand: Mod assist Stand pivot transfers: Mod assist       General transfer comment: leans over RW due to fatigue  Ambulation/Gait Ambulation/Gait assistance: Max assist Ambulation Distance (Feet): 5 Feet Assistive device: Rolling walker (2 wheeled) Gait Pattern/deviations: Decreased step length - right;Decreased step length - left;Decreased stride length   Gait velocity interpretation: Below normal speed for age/gender General Gait Details: limited to 5-6 slow unsteady steps at bedside during transfer to chair, frequent buckling of knees   Stairs            Wheelchair Mobility    Modified Rankin (Stroke Patients Only)       Balance Overall balance assessment: Needs assistance Sitting-balance support: Feet supported;Bilateral upper extremity supported Sitting balance-Leahy Scale: Poor Sitting balance - Comments: fair/poor with frequent leaning backwards Postural control: Posterior lean Standing balance support: Bilateral upper extremity supported;During functional activity Standing balance-Leahy Scale: Poor Standing balance comment: fair/poor with RW                            Cognition Arousal/Alertness: Awake/alert Behavior During Therapy: WFL for tasks assessed/performed Overall Cognitive Status: Within  Functional Limits for tasks assessed                                        Exercises      General Comments        Pertinent Vitals/Pain Pain Assessment: No/denies pain    Home Living                      Prior Function             PT Goals (current goals can now be found in the care plan section) Acute Rehab PT Goals Patient Stated Goal: return home PT Goal Formulation: With patient Time For Goal Achievement: 06/13/17 Potential to Achieve Goals: Fair Progress towards PT goals: Progressing toward goals    Frequency    Min 3X/week      PT Plan Current plan remains appropriate    Co-evaluation              AM-PAC PT "6 Clicks" Daily Activity  Outcome Measure  Difficulty turning over in bed (including adjusting bedclothes, sheets and blankets)?: A Lot Difficulty moving from lying on back to sitting on the side of the bed? : A Lot Difficulty sitting down on and standing up from a chair with arms (e.g., wheelchair, bedside commode, etc,.)?: A Lot Help needed moving to and from a bed to chair (including a wheelchair)?: A Lot Help needed walking in hospital room?: A Lot Help needed climbing 3-5 steps with a railing? : Total 6 Click Score: 11    End of Session   Activity Tolerance: Patient tolerated treatment well;Patient limited by fatigue Patient left: in chair;with call bell/phone within reach;with nursing/sitter in room Nurse Communication: Mobility status PT Visit Diagnosis: Unsteadiness on feet (R26.81);Other abnormalities of gait and mobility (R26.89);Muscle weakness (generalized) (M62.81)     Time: 1610-9604 PT Time Calculation (min) (ACUTE ONLY): 29 min  Charges:  $Therapeutic Activity: 23-37 mins                    G Codes:       1:48 PM, 06/17/2017 Ocie Bob, MPT Physical Therapist with [facility] [phone] office [phone] mobile phone

## 2017-06-01 DIAGNOSIS — J690 Pneumonitis due to inhalation of food and vomit: Secondary | ICD-10-CM | POA: Diagnosis present

## 2017-06-01 LAB — GLUCOSE, CAPILLARY
Glucose-Capillary: 143 mg/dL — ABNORMAL HIGH (ref 65–99)
Glucose-Capillary: 120 mg/dL — ABNORMAL HIGH (ref 65–99)
Glucose-Capillary: 160 mg/dL — ABNORMAL HIGH (ref 65–99)
Glucose-Capillary: 185 mg/dL — ABNORMAL HIGH (ref 65–99)

## 2017-06-01 MED ORDER — DIPHENHYDRAMINE HCL 50 MG/ML IJ SOLN
25.0000 mg | Freq: Once | INTRAMUSCULAR | Status: AC
  Administered 2017-06-01: 25 mg via INTRAVENOUS
  Filled 2017-06-01: qty 1

## 2017-06-01 MED ORDER — IPRATROPIUM-ALBUTEROL 0.5-2.5 (3) MG/3ML IN SOLN
3.0000 mL | Freq: Four times a day (QID) | RESPIRATORY_TRACT | Status: DC
  Administered 2017-06-01: 3 mL via RESPIRATORY_TRACT
  Filled 2017-06-01: qty 3

## 2017-06-01 NOTE — Plan of Care (Signed)
progressing 

## 2017-06-01 NOTE — Progress Notes (Signed)
Progress note:  Brief summary:  72 year old male admitted to the hospital with fever, altered mental status and right sided dental infection.  Started on intravenous Unasyn.  Mental status has improved to baseline, but he still having fevers.  Influenza panel was noted to be negative.  Patient is having cough and shortness of breath.  CT chest has indicated evidence of pneumonia.  With his history of coughing after eating and drinking, suspect aspiration pneumonia.  Speech therapy has been consulted.  He will need placement in skilled nursing facility on discharge.  Anticipate discharge in the next 24-48 hours.  Subjective He does not have any new complaints today.  Still has cough which is productive.  Shortness of breath improving.  Past Medical History:  Diagnosis Date  . Adjustment disorder with mixed emotional features   . Atrial flutter (HCC)   . CAD (coronary artery disease)   . Depression   . Diabetes mellitus type II, uncontrolled (HCC)   . Diabetic peripheral neuropathy (HCC)   . Erectile dysfunction   . GERD (gastroesophageal reflux disease)   . Herpes zoster   . Hyperlipidemia   . Hypertension   . Obesity   . Osteoarthritis   . Postherpetic neuralgia   . Sleep apnea     Past Surgical History:  Procedure Laterality Date  . CARDIOVERSION N/A 11/10/2015   Procedure: CARDIOVERSION;  Surgeon: Laurey Moralealton S McLean, MD;  [facility];  Service: Cardiovascular;  Laterality: N/A;  . CARDIOVERSION N/A 02/21/2016   Procedure: CARDIOVERSION;  Surgeon: Lewayne BuntingBrian S Crenshaw, MD;  [facility];  Service: Cardiovascular;  Laterality: N/A;  . CORONARY ARTERY BYPASS GRAFT  1996   eith a LIMA to the LAD, saphenous vein graft to the acute marginal, saphenous vein graft to the PDA and saphenous vein graft to the circumflex.  [name]. KNEE ARTHROSCOPY  05/2004   right knee  . REPLACEMENT TOTAL KNEE BILATERAL    . SHOULDER SURGERY       reports that  has never smoked. he has  never used smokeless tobacco. He reports that he drinks about 1.2 oz of alcohol per week. He reports that he does not use drugs.  No Known Allergies  Family History  Problem Relation Age of Onset  . Coronary artery disease Mother        deceased at 7675  . Coronary artery disease Father        died age 73  . Stroke Unknown        half brother  . Stroke Brother     Prior to Admission medications   Medication Sig Start Date End Date Taking? Authorizing Provider  acetaminophen (TYLENOL) 500 MG tablet Take 2 tablets (1,000 mg total) by mouth every 6 (six) hours as needed. Patient taking differently: Take 1,000 mg by mouth every 6 (six) hours as needed for mild pain.  05/24/16   Arby BarrettePfeiffer, Marcy, MD  apixaban (ELIQUIS) 5 MG TABS tablet Take 1 tablet (5 mg total) by mouth 2 (two) times daily. 08/09/16   Nafziger, Kandee Keenory, NP  atorvastatin (LIPITOR) 80 MG tablet Take 1 tablet (80 mg total) by mouth daily. 04/03/17   Nafziger, Kandee Keenory, NP  buPROPion (WELLBUTRIN SR) 150 MG 12 hr tablet Take 1 tablet (150 mg total) by mouth 2 (two) times daily. 04/02/17   Nafziger, Kandee Keenory, NP  doxycycline (VIBRA-TABS) 100 MG tablet Take 1 tablet (100 mg total) by mouth 2 (two) times daily. 05/01/17   Nafziger, Kandee Keenory, NP  furosemide (LASIX) 20  MG tablet Take 2 tablets (40 mg total) by mouth daily. 04/24/17 07/23/17  Lewayne Bunting, MD  glipiZIDE (GLUCOTROL) 10 MG tablet Take 10 mg by mouth daily. 07/14/16   [provider]  glucose blood (ACCU-CHEK ACTIVE STRIPS) test strip Use as instructed 03/29/16   Nafziger, Kandee Keen, NP  HYDROcodone-homatropine (HYCODAN) 5-1.5 MG/5ML syrup Take 5 mLs by mouth every 8 (eight) hours as needed for cough. 04/05/17   Nafziger, Kandee Keen, NP  Insulin Glargine (BASAGLAR KWIKPEN) 100 UNIT/ML SOPN Inject 0.25 mLs (25 Units total) into the skin at bedtime. Patient taking differently: Inject 20 Units into the skin at bedtime.  07/06/16   Angiulli, Mcarthur Rossetti, PA-C  Insulin Pen Needle (PEN NEEDLES 31GX5/16") 31G  X 8 MM MISC Use to inject insulin 5 times a day. 07/06/16   Angiulli, Mcarthur Rossetti, PA-C  isosorbide mononitrate (IMDUR) 30 MG 24 hr tablet Take 1 tablet (30 mg total) by mouth daily. 08/09/16   Nafziger, Kandee Keen, NP  Lancets (ACCU-CHEK MULTICLIX) lancets Use up to 5 times a day to check blood sugar. 07/06/16   Angiulli, Mcarthur Rossetti, PA-C  losartan (COZAAR) 50 MG tablet Take 1 tablet (50 mg total) by mouth daily. 05/02/17 04/27/18  Azalee Course, PA  metFORMIN (GLUCOPHAGE) 1000 MG tablet TAKE 1 TABLET (1,000 MG TOTAL) BY MOUTH 2 (TWO) TIMES DAILY WITH A MEAL. 12/12/16   Nafziger, Kandee Keen, NP  mirtazapine (REMERON) 15 MG tablet Take 15 mg by mouth at bedtime.    [provider]  Nebivolol HCl (BYSTOLIC) 20 MG TABS Take 1 tablet by mouth daily.    [provider]  omeprazole (PRILOSEC) 20 MG capsule TAKE ONE CAPSULE TWICE A DAY BEFORE A MEAL 05/21/17   Nafziger, Kandee Keen, NP  thiamine 100 MG tablet Take 1 tablet (100 mg total) by mouth daily. 08/09/16   Nafziger, Kandee Keen, NP  vitamin B-12 (CYANOCOBALAMIN) 1000 MCG tablet Take 2 tablets (2,000 mcg total) by mouth daily. 08/09/16   Shirline Frees, NP    Physical Exam:  Vitals:   05/31/17 0921 05/31/17 1500 05/31/17 2006 06/01/17 1422  BP: 122/73 109/61 (!) 130/58 118/71  Pulse: 80 73 64 67  Resp: (!) 22 (!) 22 (!) 24 (!) 24  Temp: (!) 96.4 F (35.8 C) 99.2 F (37.3 C) 99.2 F (37.3 C) 98.4 F (36.9 C)  TempSrc: Axillary Oral Oral Axillary  SpO2: 100% 100% 100% 100%  Weight:      Height:        Exam  General exam: Not in any distress. Respiratory system: Bilateral rhonchi and mild wheeze. Respiratory effort normal. Cardiovascular system:RRR. No murmurs, rubs, gallops. Gastrointestinal system: Abdomen is nondistended, soft and nontender. No organomegaly or masses felt. Normal bowel sounds heard. Central nervous system: No gross motor deficits.. Extremities: No C/C/E, +pedal pulses Skin: No rashes, lesions or ulcers Psychiatry: Normal mood, does not  appear anxious.  Cooperative    Labs on Admission: I have personally reviewed following labs and imaging studies  CBC: Recent Labs  Lab 05/29/17 0538 05/30/17 0401 05/31/17 0738  WBC 11.1* 9.8 7.7  NEUTROABS 8.6*  --   --   HGB 10.1* 9.3* 9.1*  HCT 33.8* 31.3* 30.5*  MCV 100.9* 101.3* 102.0*  PLT 199 161 135*   Basic Metabolic Panel: Recent Labs  Lab 05/29/17 0538 05/30/17 0401 05/31/17 0738  NA 141 141 143  K 4.5 4.0 3.7  CL 106 107 106  CO2 23 23 29   GLUCOSE 108* 113* 132*  BUN 33* 31*  32*  CREATININE 1.23 1.28* 1.23  CALCIUM 9.3 9.0 8.8*   GFR: Estimated Creatinine Clearance: 63.1 mL/min (by C-G formula based on SCr of 1.23 mg/dL). Liver Function Tests: Recent Labs  Lab 05/29/17 0538  AST 24  ALT 20  ALKPHOS 126  BILITOT 1.7*  PROT 6.6  ALBUMIN 3.6   No results for input(s): LIPASE, AMYLASE in the last 168 hours. No results for input(s): AMMONIA in the last 168 hours. Coagulation Profile: No results for input(s): INR, PROTIME in the last 168 hours. Cardiac Enzymes: No results for input(s): CKTOTAL, CKMB, CKMBINDEX, TROPONINI in the last 168 hours. BNP (last 3 results) No results for input(s): PROBNP in the last 8760 hours. HbA1C: No results for input(s): HGBA1C in the last 72 hours. CBG: Recent Labs  Lab 05/31/17 1145 05/31/17 2046 06/01/17 0758 06/01/17 1143 06/01/17 1722  GLUCAP 163* 177* 143* 120* 160*   Lipid Profile: No results for input(s): CHOL, HDL, LDLCALC, TRIG, CHOLHDL, LDLDIRECT in the last 72 hours. Thyroid Function Tests: No results for input(s): TSH, T4TOTAL, FREET4, T3FREE, THYROIDAB in the last 72 hours. Anemia Panel: No results for input(s): VITAMINB12, FOLATE, FERRITIN, TIBC, IRON, RETICCTPCT in the last 72 hours. Urine analysis:    Component Value Date/Time   COLORURINE AMBER (A) 05/29/2017 0523   APPEARANCEUR HAZY (A) 05/29/2017 0523   LABSPEC 1.020 05/29/2017 0523   PHURINE 5.0 05/29/2017 0523   GLUCOSEU NEGATIVE  05/29/2017 0523   GLUCOSEU NEGATIVE 05/27/2007 0844   HGBUR NEGATIVE 05/29/2017 0523   HGBUR negative 05/05/2010 1041   BILIRUBINUR NEGATIVE 05/29/2017 0523   KETONESUR NEGATIVE 05/29/2017 0523   PROTEINUR 30 (A) 05/29/2017 0523   UROBILINOGEN 0.2 05/05/2010 1041   NITRITE NEGATIVE 05/29/2017 0523   LEUKOCYTESUR NEGATIVE 05/29/2017 0523    Radiological Exams on Admission: Ct Chest W Contrast  Result Date: 05/31/2017 CLINICAL DATA:  Inpatient.  Altered mental status.  Dyspnea. EXAM: CT CHEST WITH CONTRAST TECHNIQUE: Multidetector CT imaging of the chest was performed during intravenous contrast administration. CONTRAST:  75mL ISOVUE-300 IOPAMIDOL (ISOVUE-300) INJECTION 61% COMPARISON:  05/29/2017 chest radiograph.  05/24/2016 chest CT. FINDINGS: Cardiovascular: Borderline mildly enlarged heart. No significant pericardial fluid/thickening. Left main and 3 vessel coronary atherosclerosis status post CABG. Atherosclerotic nonaneurysmal thoracic aorta. Stable dilated main pulmonary artery (3.6 cm diameter). No central pulmonary emboli. Mediastinum/Nodes: No discrete thyroid nodules. Unremarkable esophagus. No pathologically enlarged axillary, mediastinal or hilar lymph nodes. Lungs/Pleura: No pneumothorax. Small dependent bilateral pleural effusions, left greater than right. Patchy consolidation and ground-glass attenuation throughout the dependent left lower lobe. Interlobular septal thickening throughout both lungs. Scattered tiny granulomas in the right lung are stable. No new significant pulmonary nodules. Upper abdomen: Exophytic simple 1.4 cm anterior upper right renal cyst. Musculoskeletal: No aggressive appearing focal osseous lesions. Stable configuration of median sternotomy wires, several of which demonstrate discontinuities, unchanged. Moderate thoracic spondylosis. IMPRESSION: 1. Patchy consolidation and ground-glass attenuation in dependent left lower lobe, suggesting aspiration or pneumonia.  2. Spectrum of findings suggestive of mild congestive heart failure, with borderline mild cardiomegaly, small dependent bilateral pleural effusions and interlobular septal thickening compatible with pulmonary edema. Aortic Atherosclerosis (ICD10-I70.0). Electronically Signed   By: Delbert Phenix M.D.   On: 05/31/2017 19:31    EKG: Independently reviewed.  Atrial fibrillation with a left bundle branch block, no significant change from prior tracings  Assessment/Plan    1. Sepsis,related to acute right-sided dental infection.  Patient noted to have swelling of the right mandible area.  CT confirms dental infection  without any drainable abscess. He was started on IV unasyn with improvement of right face swelling.  2. Aspiration pneumonia. Patient continued to have fevers despite receiving IV antibiotics.  Wife reports that he often became "choked up" after eating or drinking.  CT chest performed which confirmed evidence of aspiration pneumonia.  Antibiotic coverage has been broadened to vancomycin and Zosyn.  Speech therapy consulted for swallow evaluation. 3. Paroxysmal atrial fibrillation.  Chads vas 2 score of at least 3-  continue on rate control with nebivolol and anticoagulation with Eliquis. 4. Chronic diastolic congestive heart failure.  EF on recent echo 55%.  Appears stable at this time.  Continue home dose of Lasix.  Weight appears to be near baseline.  Continue Imdur 5. GERD.  Continue on PPI 6. Hyperlipidemia.  Continue on statin. 7. DM type II.  Glipizide and Lantus dose has been adjusted.  Blood sugars are currently stable.  Continue to monitor. 8. Hypertension.  Continue on Bystolic, losartan 9. Severe toxic encephalopathy with delirium. Related to ativan and haldol. These medications have been decreased/discontinued. Patient mental status is better. 10. Urinary retention.  Patient required Foley catheter placement.  Flomax has been added.  Will need to attempt voiding trial prior to  discharge.    DVT prophylaxis: Eliquis Code Status: DNR Family Communication: no family present Disposition Plan: will need nursing facility placement on discharge Consults called: None Admission status: Inpatient, MedSurg   Signature  Erick Blinks M.D on 06/01/2017 at 6:19 PM  Between 7am to 7pm - Pager - [phone] ( page via [email or web address], text pages only, please mention full 10 digit call back number).  After 7pm go to [email or web address] - password [facility]

## 2017-06-02 ENCOUNTER — Inpatient Hospital Stay (HOSPITAL_COMMUNITY): Payer: Medicare Other

## 2017-06-02 LAB — BASIC METABOLIC PANEL
Anion gap: 14 (ref 5–15)
BUN: 26 mg/dL — ABNORMAL HIGH (ref 6–20)
CO2: 25 mmol/L (ref 22–32)
Calcium: 9 mg/dL (ref 8.9–10.3)
Chloride: 105 mmol/L (ref 101–111)
Creatinine, Ser: 1.1 mg/dL (ref 0.61–1.24)
GFR calc Af Amer: >60 mL/min (ref >60)
GFR calc non Af Amer: >60 mL/min (ref >60)
Glucose, Bld: 126 mg/dL — ABNORMAL HIGH (ref 65–99)
Potassium: 3.4 mmol/L — ABNORMAL LOW (ref 3.5–5.1)
Sodium: 144 mmol/L (ref 135–145)

## 2017-06-02 LAB — CBC
HCT: 31 % — ABNORMAL LOW (ref 39.0–52.0)
Hemoglobin: 9.3 g/dL — ABNORMAL LOW (ref 13.0–17.0)
MCH: 30.5 pg (ref 26.0–34.0)
MCHC: 30 g/dL (ref 30.0–36.0)
MCV: 101.6 fL — ABNORMAL HIGH (ref 78.0–100.0)
Platelets: 173 10*3/uL (ref 150–400)
RBC: 3.05 MIL/uL — ABNORMAL LOW (ref 4.22–5.81)
RDW: 18.1 % — ABNORMAL HIGH (ref 11.5–15.5)
WBC: 8.5 10*3/uL (ref 4.0–10.5)

## 2017-06-02 LAB — GLUCOSE, CAPILLARY
Glucose-Capillary: 126 mg/dL — ABNORMAL HIGH (ref 65–99)
Glucose-Capillary: 106 mg/dL — ABNORMAL HIGH (ref 65–99)
Glucose-Capillary: 84 mg/dL (ref 65–99)
Glucose-Capillary: 168 mg/dL — ABNORMAL HIGH (ref 65–99)

## 2017-06-02 MED ORDER — IPRATROPIUM-ALBUTEROL 0.5-2.5 (3) MG/3ML IN SOLN
3.0000 mL | Freq: Four times a day (QID) | RESPIRATORY_TRACT | Status: DC
  Administered 2017-06-02 – 2017-06-03 (×5): 3 mL via RESPIRATORY_TRACT
  Filled 2017-06-02 (×6): qty 3

## 2017-06-02 MED ORDER — IPRATROPIUM-ALBUTEROL 0.5-2.5 (3) MG/3ML IN SOLN
3.0000 mL | Freq: Four times a day (QID) | RESPIRATORY_TRACT | Status: DC
  Administered 2017-06-02 (×2): 3 mL via RESPIRATORY_TRACT
  Filled 2017-06-02 (×2): qty 3

## 2017-06-02 MED ORDER — IPRATROPIUM-ALBUTEROL 0.5-2.5 (3) MG/3ML IN SOLN: 3.0000 mL | Freq: Two times a day (BID) | RESPIRATORY_TRACT | Status: DC

## 2017-06-02 MED ORDER — POTASSIUM CHLORIDE CRYS ER 20 MEQ PO TBCR
40.0000 meq | EXTENDED_RELEASE_TABLET | Freq: Once | ORAL | Status: DC
  Filled 2017-06-02: qty 2

## 2017-06-02 MED ORDER — HALOPERIDOL LACTATE 5 MG/ML IJ SOLN: 2.0000 mg | Freq: Four times a day (QID) | INTRAMUSCULAR | Status: DC | PRN

## 2017-06-02 MED ORDER — LORAZEPAM 2 MG/ML IJ SOLN
1.0000 mg | INTRAMUSCULAR | Status: DC | PRN
  Administered 2017-06-02 (×2): 1 mg via INTRAVENOUS
  Filled 2017-06-02 (×2): qty 1

## 2017-06-02 MED ORDER — HALOPERIDOL LACTATE 5 MG/ML IJ SOLN
5.0000 mg | Freq: Four times a day (QID) | INTRAMUSCULAR | Status: DC | PRN
  Administered 2017-06-02 – 2017-06-11 (×13): 5 mg via INTRAVENOUS
  Filled 2017-06-02 (×13): qty 1

## 2017-06-02 NOTE — Progress Notes (Signed)
Upon assessing patient is 2000, patient has no restraints on.  Unclear in report from AM nurse if restraints have been discontinued.  Patient has been switched over to bi-pap by respiratory due to periods of sleep apnea and no breathing.  Patient will occasional try to pull at catheter and appears agitated at times.  Safety sitter at bedside.  Sitter is able to calm the patient.

## 2017-06-02 NOTE — Progress Notes (Signed)
Progress note:  Brief summary:  72 year old male admitted to the hospital with fever, altered mental status and right sided dental infection.  Started on intravenous Unasyn.  Mental status has improved to baseline, but he still having fevers.  Influenza panel was noted to be negative.  Patient is having cough and shortness of breath.  CT chest has indicated evidence of pneumonia.  With his history of coughing after eating and drinking, suspect aspiration pneumonia.  Speech therapy has been consulted.  He will need placement in skilled nursing facility on discharge.  Anticipate discharge in the next 24-48 hours.  Subjective Pt having some sundowning in the evenings, He has no complaints this morning.  Wife is at bedside.   Past Medical History:  Diagnosis Date  . Adjustment disorder with mixed emotional features   . Atrial flutter (HCC)   . CAD (coronary artery disease)   . Depression   . Diabetes mellitus type II, uncontrolled (HCC)   . Diabetic peripheral neuropathy (HCC)   . Erectile dysfunction   . GERD (gastroesophageal reflux disease)   . Herpes zoster   . Hyperlipidemia   . Hypertension   . Obesity   . Osteoarthritis   . Postherpetic neuralgia   . Sleep apnea     Past Surgical History:  Procedure Laterality Date  . CARDIOVERSION N/A 11/10/2015   Procedure: CARDIOVERSION;  Surgeon: Laurey Morale, MD;  Location: [facility] END[facility];  Service: Cardiovascular;  Laterality: N/A;  . CARDIOVERSION N/A 02/21/2016   Procedure: CARDIOVERSION;  Surgeon: Lewayne Bunting, MD;  Location: [facility] ENDOSCOPY;  Service: Cardiovascular;  Laterality: N/A;  . CORONARY ARTERY BYPASS GRAFT  1996   eith a LIMA to the LAD, saphenous vein graft to the acute marginal, saphenous vein graft to the PDA and saphenous vein graft to the circumflex.  [name] KNEE ARTHROSCOPY  05/2004   right knee  . REPLACEMENT TOTAL KNEE BILATERAL    . SHOULDER SURGERY       reports that  has never smoked. he has never used  smokeless tobacco. He reports that he drinks about 1.2 oz of alcohol per week. He reports that he does not use drugs.  No Known Allergies  Family History  Problem Relation Age of Onset  . Coronary artery disease Mother        deceased at 77  . Coronary artery disease Father        died age 87  . Stroke Unknown        half brother  . Stroke Brother     Prior to Admission medications   Medication Sig Start Date End Date Taking? Authorizing Provider  acetaminophen (TYLENOL) 500 MG tablet Take 2 tablets (1,000 mg total) by mouth every 6 (six) hours as needed. Patient taking differently: Take 1,000 mg by mouth every 6 (six) hours as needed for mild pain.  05/24/16   Arby Barrette, MD  apixaban (ELIQUIS) 5 MG TABS tablet Take 1 tablet (5 mg total) by mouth 2 (two) times daily. 08/09/16   Nafziger, Kandee Keen, NP  atorvastatin (LIPITOR) 80 MG tablet Take 1 tablet (80 mg total) by mouth daily. 04/03/17   Nafziger, Kandee Keen, NP  buPROPion (WELLBUTRIN SR) 150 MG 12 hr tablet Take 1 tablet (150 mg total) by mouth 2 (two) times daily. 04/02/17   Nafziger, Kandee Keen, NP  doxycycline (VIBRA-TABS) 100 MG tablet Take 1 tablet (100 mg total) by mouth 2 (two) times daily. 05/01/17   Nafziger, Kandee Keen, NP  furosemide (LASIX) 20 MG  tablet Take 2 tablets (40 mg total) by mouth daily. 04/24/17 07/23/17  Lewayne Bunting, MD  glipiZIDE (GLUCOTROL) 10 MG tablet Take 10 mg by mouth daily. 07/14/16   [provider]  glucose blood (ACCU-CHEK ACTIVE STRIPS) test strip Use as instructed 03/29/16   Nafziger, Kandee Keen, NP  HYDROcodone-homatropine (HYCODAN) 5-1.5 MG/5ML syrup Take 5 mLs by mouth every 8 (eight) hours as needed for cough. 04/05/17   Nafziger, Kandee Keen, NP  Insulin Glargine (BASAGLAR KWIKPEN) 100 UNIT/ML SOPN Inject 0.25 mLs (25 Units total) into the skin at bedtime. Patient taking differently: Inject 20 Units into the skin at bedtime.  07/06/16   Angiulli, Mcarthur Rossetti, PA-C  Insulin Pen Needle (PEN NEEDLES 31GX5/16") 31G X 8 MM MISC  Use to inject insulin 5 times a day. 07/06/16   Angiulli, Mcarthur Rossetti, PA-C  isosorbide mononitrate (IMDUR) 30 MG 24 hr tablet Take 1 tablet (30 mg total) by mouth daily. 08/09/16   Nafziger, Kandee Keen, NP  Lancets (ACCU-CHEK MULTICLIX) lancets Use up to 5 times a day to check blood sugar. 07/06/16   Angiulli, Mcarthur Rossetti, PA-C  losartan (COZAAR) 50 MG tablet Take 1 tablet (50 mg total) by mouth daily. 05/02/17 04/27/18  Azalee Course, PA  metFORMIN (GLUCOPHAGE) 1000 MG tablet TAKE 1 TABLET (1,000 MG TOTAL) BY MOUTH 2 (TWO) TIMES DAILY WITH A MEAL. 12/12/16   Nafziger, Kandee Keen, NP  mirtazapine (REMERON) 15 MG tablet Take 15 mg by mouth at bedtime.    [provider]  Nebivolol HCl (BYSTOLIC) 20 MG TABS Take 1 tablet by mouth daily.    [provider]  omeprazole (PRILOSEC) 20 MG capsule TAKE ONE CAPSULE TWICE A DAY BEFORE A MEAL 05/21/17   Nafziger, Kandee Keen, NP  thiamine 100 MG tablet Take 1 tablet (100 mg total) by mouth daily. 08/09/16   Nafziger, Kandee Keen, NP  vitamin B-12 (CYANOCOBALAMIN) 1000 MCG tablet Take 2 tablets (2,000 mcg total) by mouth daily. 08/09/16   Shirline Frees, NP    Physical Exam:  Vitals:   06/01/17 2229 06/02/17 0135 06/02/17 0515 06/02/17 0822  BP: 138/61  (!) 137/91   Pulse: 98  82   Resp: 20  (!) 22   Temp: 98.4 F (36.9 C)  98.9 F (37.2 C)   TempSrc: Oral  Axillary   SpO2: 98% 92% 99% 98%  Weight:      Height:       Exam  General exam: Not in any distress. Resting but arousable.  Respiratory system: Bilateral rhonchi and mild wheeze. Respiratory effort normal. Cardiovascular system: normal s1,s2 sounds. No murmurs, rubs, gallops. Gastrointestinal system: Abdomen is nondistended, soft and nontender. No organomegaly or masses felt. Normal bowel sounds heard. Central nervous system: No gross motor deficits.. Extremities: No C/C/E, +pedal pulses Skin: No rashes, lesions or ulcers Psychiatry: Normal mood, does not appear anxious.  Cooperative  Labs on Admission: I have  personally reviewed following labs and imaging studies  CBC: Recent Labs  Lab 05/29/17 0538 05/30/17 0401 05/31/17 0738 06/02/17 0331  WBC 11.1* 9.8 7.7 8.5  NEUTROABS 8.6*  --   --   --   HGB 10.1* 9.3* 9.1* 9.3*  HCT 33.8* 31.3* 30.5* 31.0*  MCV 100.9* 101.3* 102.0* 101.6*  PLT 199 161 135* 173   Basic Metabolic Panel: Recent Labs  Lab 05/29/17 0538 05/30/17 0401 05/31/17 0738 06/02/17 0331  NA 141 141 143 144  K 4.5 4.0 3.7 3.4*  CL 106 107 106 105  CO2 23 23 29  25  GLUCOSE 108* 113* 132* 126*  BUN 33* 31* 32* 26*  CREATININE 1.23 1.28* 1.23 1.10  CALCIUM 9.3 9.0 8.8* 9.0   GFR: Estimated Creatinine Clearance: 70.6 mL/min (by C-G formula based on SCr of 1.1 mg/dL). Liver Function Tests: Recent Labs  Lab 05/29/17 0538  AST 24  ALT 20  ALKPHOS 126  BILITOT 1.7*  PROT 6.6  ALBUMIN 3.6   No results for input(s): LIPASE, AMYLASE in the last 168 hours. No results for input(s): AMMONIA in the last 168 hours. Coagulation Profile: No results for input(s): INR, PROTIME in the last 168 hours. Cardiac Enzymes: No results for input(s): CKTOTAL, CKMB, CKMBINDEX, TROPONINI in the last 168 hours. BNP (last 3 results) No results for input(s): PROBNP in the last 8760 hours. HbA1C: No results for input(s): HGBA1C in the last 72 hours. CBG: Recent Labs  Lab 06/01/17 1143 06/01/17 1722 06/01/17 2016 06/02/17 0749 06/02/17 1116  GLUCAP 120* 160* 185* 126* 106*   Lipid Profile: No results for input(s): CHOL, HDL, LDLCALC, TRIG, CHOLHDL, LDLDIRECT in the last 72 hours. Thyroid Function Tests: No results for input(s): TSH, T4TOTAL, FREET4, T3FREE, THYROIDAB in the last 72 hours. Anemia Panel: No results for input(s): VITAMINB12, FOLATE, FERRITIN, TIBC, IRON, RETICCTPCT in the last 72 hours. Urine analysis:    Component Value Date/Time   COLORURINE AMBER (A) 05/29/2017 0523   APPEARANCEUR HAZY (A) 05/29/2017 0523   LABSPEC 1.020 05/29/2017 0523   PHURINE 5.0  05/29/2017 0523   GLUCOSEU NEGATIVE 05/29/2017 0523   GLUCOSEU NEGATIVE 05/27/2007 0844   HGBUR NEGATIVE 05/29/2017 0523   HGBUR negative 05/05/2010 1041   BILIRUBINUR NEGATIVE 05/29/2017 0523   KETONESUR NEGATIVE 05/29/2017 0523   PROTEINUR 30 (A) 05/29/2017 0523   UROBILINOGEN 0.2 05/05/2010 1041   NITRITE NEGATIVE 05/29/2017 0523   LEUKOCYTESUR NEGATIVE 05/29/2017 0523    Radiological Exams on Admission: Ct Chest W Contrast  Result Date: 05/31/2017 CLINICAL DATA:  Inpatient.  Altered mental status.  Dyspnea. EXAM: CT CHEST WITH CONTRAST TECHNIQUE: Multidetector CT imaging of the chest was performed during intravenous contrast administration. CONTRAST:  75mL ISOVUE-300 IOPAMIDOL (ISOVUE-300) INJECTION 61% COMPARISON:  05/29/2017 chest radiograph.  05/24/2016 chest CT. FINDINGS: Cardiovascular: Borderline mildly enlarged heart. No significant pericardial fluid/thickening. Left main and 3 vessel coronary atherosclerosis status post CABG. Atherosclerotic nonaneurysmal thoracic aorta. Stable dilated main pulmonary artery (3.6 cm diameter). No central pulmonary emboli. Mediastinum/Nodes: No discrete thyroid nodules. Unremarkable esophagus. No pathologically enlarged axillary, mediastinal or hilar lymph nodes. Lungs/Pleura: No pneumothorax. Small dependent bilateral pleural effusions, left greater than right. Patchy consolidation and ground-glass attenuation throughout the dependent left lower lobe. Interlobular septal thickening throughout both lungs. Scattered tiny granulomas in the right lung are stable. No new significant pulmonary nodules. Upper abdomen: Exophytic simple 1.4 cm anterior upper right renal cyst. Musculoskeletal: No aggressive appearing focal osseous lesions. Stable configuration of median sternotomy wires, several of which demonstrate discontinuities, unchanged. Moderate thoracic spondylosis. IMPRESSION: 1. Patchy consolidation and ground-glass attenuation in dependent left lower  lobe, suggesting aspiration or pneumonia. 2. Spectrum of findings suggestive of mild congestive heart failure, with borderline mild cardiomegaly, small dependent bilateral pleural effusions and interlobular septal thickening compatible with pulmonary edema. Aortic Atherosclerosis (ICD10-I70.0). Electronically Signed   By: Delbert PhenixJason A Poff M.D.   On: 05/31/2017 19:31    EKG: Independently reviewed.  Atrial fibrillation with a left bundle branch block, no significant change from prior tracings  Assessment/Plan    1. Sepsis - secondary to acute right-sided dental infection.  Clinically  improving.  Patient noted to have swelling of the right mandible area.  CT confirms dental infection without any drainable abscess. He was started on IV unasyn with improvement of right face swelling.  2. Aspiration pneumonia. Patient continued to have fevers despite receiving IV antibiotics.  Wife reports that he often became "choked up" after eating or drinking.  CT chest performed which confirmed evidence of aspiration pneumonia.  Antibiotic coverage has been broadened to vancomycin and Zosyn.  Speech therapy consulted for swallow evaluation. 3. Paroxysmal atrial fibrillation.  Chads vas 2 score of at least 3-  continue on rate control with nebivolol and anticoagulation with Eliquis. 4. Chronic diastolic congestive heart failure.  EF on recent echo 55%.  Appears stable at this time.  Continue home dose of Lasix.  Weight appears to be near baseline.  Continue Imdur 5. GERD.  Continue on PPI 6. Hyperlipidemia.  Continue on statin. 7. DM type II.  Glipizide and Lantus dose has been adjusted.  Blood sugars are currently stable.  Continue to monitor. 8. Hypertension.  Continue on Bystolic, losartan 9. Severe toxic encephalopathy with delirium. Related to ativan and haldol. These medications have been decreased/discontinued. Patient mental status is better.  Pt likely has vascular dementia from recent strokes.   10. Urinary  retention.  Patient required Foley catheter placement.  Flomax has been added.  Will need to attempt voiding trial prior to discharge.  DVT prophylaxis: Eliquis Code Status: DNR Family Communication: wife present and updated at length, wife wants CIR placement Disposition Plan: will need nursing facility placement on discharge Consults called: None Admission status: Inpatient, MedSurg  Standley Dakins M.D on 06/02/2017 at 12:02 PM  Between 7am to 7pm - Pager - [phone]

## 2017-06-02 NOTE — Progress Notes (Signed)
Patient appears more confused than earlier. Had some wheezing  He is struggling against restraints.
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
## 2017-06-03 ENCOUNTER — Inpatient Hospital Stay (HOSPITAL_COMMUNITY): Payer: Medicare Other

## 2017-06-03 ENCOUNTER — Encounter (HOSPITAL_COMMUNITY): Payer: Self-pay | Admitting: Family Medicine

## 2017-06-03 DIAGNOSIS — I5032 Chronic diastolic (congestive) heart failure: Secondary | ICD-10-CM

## 2017-06-03 DIAGNOSIS — I25810 Atherosclerosis of coronary artery bypass graft(s) without angina pectoris: Secondary | ICD-10-CM

## 2017-06-03 DIAGNOSIS — R410 Disorientation, unspecified: Secondary | ICD-10-CM

## 2017-06-03 DIAGNOSIS — G4730 Sleep apnea, unspecified: Secondary | ICD-10-CM

## 2017-06-03 LAB — ECHOCARDIOGRAM COMPLETE
Height: 75 in
Weight: 3209.9 oz

## 2017-06-03 LAB — CULTURE, BLOOD (ROUTINE X 2)
Culture: NO GROWTH
Culture: NO GROWTH
Special Requests: ADEQUATE
Special Requests: ADEQUATE

## 2017-06-03 LAB — BASIC METABOLIC PANEL
Anion gap: 10 (ref 5–15)
BUN: 23 mg/dL — ABNORMAL HIGH (ref 6–20)
CO2: 30 mmol/L (ref 22–32)
Calcium: 8.6 mg/dL — ABNORMAL LOW (ref 8.9–10.3)
Chloride: 102 mmol/L (ref 101–111)
Creatinine, Ser: 1.17 mg/dL (ref 0.61–1.24)
GFR calc Af Amer: >60 mL/min (ref >60)
GFR calc non Af Amer: >60 mL/min (ref >60)
Glucose, Bld: 143 mg/dL — ABNORMAL HIGH (ref 65–99)
Potassium: 3.5 mmol/L (ref 3.5–5.1)
Sodium: 142 mmol/L (ref 135–145)

## 2017-06-03 LAB — GLUCOSE, CAPILLARY
Glucose-Capillary: 178 mg/dL — ABNORMAL HIGH (ref 65–99)
Glucose-Capillary: 137 mg/dL — ABNORMAL HIGH (ref 65–99)
Glucose-Capillary: 88 mg/dL (ref 65–99)
Glucose-Capillary: 100 mg/dL — ABNORMAL HIGH (ref 65–99)
Glucose-Capillary: 122 mg/dL — ABNORMAL HIGH (ref 65–99)

## 2017-06-03 LAB — MRSA PCR SCREENING: MRSA by PCR: NEGATIVE

## 2017-06-03 LAB — CBC
HCT: 29.7 % — ABNORMAL LOW (ref 39.0–52.0)
Hemoglobin: 8.9 g/dL — ABNORMAL LOW (ref 13.0–17.0)
MCH: 30.5 pg (ref 26.0–34.0)
MCHC: 30 g/dL (ref 30.0–36.0)
MCV: 101.7 fL — ABNORMAL HIGH (ref 78.0–100.0)
Platelets: 156 10*3/uL (ref 150–400)
RBC: 2.92 MIL/uL — ABNORMAL LOW (ref 4.22–5.81)
RDW: 17.9 % — ABNORMAL HIGH (ref 11.5–15.5)
WBC: 8.7 10*3/uL (ref 4.0–10.5)

## 2017-06-03 LAB — LIPID PANEL
Cholesterol: 96 mg/dL (ref 0–200)
HDL: 24 mg/dL — ABNORMAL LOW (ref >40)
LDL Cholesterol: 58 mg/dL (ref 0–99)
Total CHOL/HDL Ratio: 4 RATIO
Triglycerides: 72 mg/dL (ref <150)
VLDL: 14 mg/dL (ref 0–40)

## 2017-06-03 LAB — HEMOGLOBIN A1C
Hgb A1c MFr Bld: 5.2 % (ref 4.8–5.6)
Mean Plasma Glucose: 102.54 mg/dL

## 2017-06-03 LAB — MAGNESIUM: Magnesium: 1.9 mg/dL (ref 1.7–2.4)

## 2017-06-03 LAB — VANCOMYCIN, TROUGH: Vancomycin Tr: 14 ug/mL — ABNORMAL LOW (ref 15–20)

## 2017-06-03 MED ORDER — SODIUM CHLORIDE 0.9 % IV SOLN
3.0000 g | Freq: Four times a day (QID) | INTRAVENOUS | Status: AC
  Administered 2017-06-03 – 2017-06-05 (×9): 3 g via INTRAVENOUS
  Filled 2017-06-03 (×9): qty 3

## 2017-06-03 MED ORDER — IPRATROPIUM-ALBUTEROL 0.5-2.5 (3) MG/3ML IN SOLN
3.0000 mL | Freq: Three times a day (TID) | RESPIRATORY_TRACT | Status: DC
  Administered 2017-06-04 (×2): 3 mL via RESPIRATORY_TRACT
  Filled 2017-06-03 (×3): qty 3

## 2017-06-03 MED ORDER — SODIUM CHLORIDE 0.9 % IV SOLN
INTRAVENOUS | Status: AC
  Administered 2017-06-03 – 2017-06-04 (×2): via INTRAVENOUS

## 2017-06-03 MED ORDER — ALBUTEROL SULFATE (2.5 MG/3ML) 0.083% IN NEBU
2.5000 mg | INHALATION_SOLUTION | Freq: Four times a day (QID) | RESPIRATORY_TRACT | Status: DC | PRN
  Filled 2017-06-03: qty 3

## 2017-06-03 MED ORDER — DIPHENHYDRAMINE HCL 50 MG/ML IJ SOLN: 25.0000 mg | Freq: Once | INTRAMUSCULAR | Status: DC

## 2017-06-03 MED ORDER — ENOXAPARIN SODIUM 100 MG/ML ~~LOC~~ SOLN
90.0000 mg | Freq: Two times a day (BID) | SUBCUTANEOUS | Status: DC
  Administered 2017-06-03 – 2017-06-06 (×7): 90 mg via SUBCUTANEOUS
  Filled 2017-06-03 (×7): qty 1

## 2017-06-03 MED ORDER — BISACODYL 10 MG RE SUPP: 10.0000 mg | Freq: Once | RECTAL | Status: DC

## 2017-06-03 MED ORDER — LORAZEPAM 2 MG/ML IJ SOLN
0.5000 mg | Freq: Once | INTRAMUSCULAR | Status: AC
  Administered 2017-06-03: 0.5 mg via INTRAVENOUS
  Filled 2017-06-03: qty 1

## 2017-06-03 MED ORDER — PERFLUTREN LIPID MICROSPHERE
1.0000 mL | INTRAVENOUS | Status: AC | PRN
  Administered 2017-06-03: 2 mL via INTRAVENOUS
  Administered 2017-06-03: 1 mL via INTRAVENOUS

## 2017-06-03 MED ORDER — QUETIAPINE FUMARATE 25 MG PO TABS
25.0000 mg | ORAL_TABLET | Freq: Every day | ORAL | Status: DC
  Administered 2017-06-03 – 2017-06-04 (×2): 25 mg via ORAL
  Filled 2017-06-03 (×5): qty 1

## 2017-06-03 NOTE — Progress Notes (Signed)
Progress note:  Brief summary:  72 year old male admitted to the hospital with fever, altered mental status and right sided dental infection.  Started on intravenous Unasyn.  Mental status has improved to baseline, but he still having fevers.  Influenza panel was noted to be negative.  Patient is having cough and shortness of breath.  CT chest has indicated evidence of pneumonia.  With his history of coughing after eating and drinking, suspect aspiration pneumonia.  Speech therapy has been consulted.  He will need placement in skilled nursing facility on discharge.  Anticipate discharge in the next 24-48 hours.  Subjective Pt having some sundowning in the evenings, He has no complaints this morning.  Wife is at bedside.   Past Medical History:  Diagnosis Date  . Adjustment disorder with mixed emotional features   . Atrial flutter (HCC)   . CAD (coronary artery disease)   . Depression   . Diabetes mellitus type II, uncontrolled (HCC)   . Diabetic peripheral neuropathy (HCC)   . Erectile dysfunction   . GERD (gastroesophageal reflux disease)   . Herpes zoster   . Hyperlipidemia   . Hypertension   . Obesity   . Osteoarthritis   . Postherpetic neuralgia   . Sleep apnea     Past Surgical History:  Procedure Laterality Date  . CARDIOVERSION N/A 11/10/2015   Procedure: CARDIOVERSION;  Surgeon: Laurey Morale, MD;  Location: [facility] ENDOSCOPY;  Service: Cardiovascular;  Laterality: N/A;  . CARDIOVERSION N/A 02/21/2016   Procedure: CARDIOVERSION;  Surgeon: Lewayne Bunting, MD;  Location: [facility] ENDOSCOPY;  Service: Cardiovascular;  Laterality: N/A;  . CORONARY ARTERY BYPASS GRAFT  1996   eith a LIMA to the LAD, saphenous vein graft to the acute marginal, saphenous vein graft to the PDA and saphenous vein graft to the circumflex.  [name] KNEE ARTHROSCOPY  05/2004   right knee  . REPLACEMENT TOTAL KNEE BILATERAL    . SHOULDER SURGERY       reports that  has never smoked. he has never used  smokeless tobacco. He reports that he drinks about 1.2 oz of alcohol per week. He reports that he does not use drugs.  No Known Allergies  Family History  Problem Relation Age of Onset  . Coronary artery disease Mother        deceased at 34  . Coronary artery disease Father        died age 44  . Stroke Unknown        half brother  . Stroke Brother     Prior to Admission medications   Medication Sig Start Date End Date Taking? Authorizing Provider  acetaminophen (TYLENOL) 500 MG tablet Take 2 tablets (1,000 mg total) by mouth every 6 (six) hours as needed. Patient taking differently: Take 1,000 mg by mouth every 6 (six) hours as needed for mild pain.  05/24/16   Arby Barrette, MD  apixaban (ELIQUIS) 5 MG TABS tablet Take 1 tablet (5 mg total) by mouth 2 (two) times daily. 08/09/16   Nafziger, Kandee Keen, NP  atorvastatin (LIPITOR) 80 MG tablet Take 1 tablet (80 mg total) by mouth daily. 04/03/17   Nafziger, Kandee Keen, NP  buPROPion (WELLBUTRIN SR) 150 MG 12 hr tablet Take 1 tablet (150 mg total) by mouth 2 (two) times daily. 04/02/17   Nafziger, Kandee Keen, NP  doxycycline (VIBRA-TABS) 100 MG tablet Take 1 tablet (100 mg total) by mouth 2 (two) times daily. 05/01/17   Nafziger, Kandee Keen, NP  furosemide (LASIX) 20 MG  tablet Take 2 tablets (40 mg total) by mouth daily. 04/24/17 07/23/17  Lewayne Bunting, MD  glipiZIDE (GLUCOTROL) 10 MG tablet Take 10 mg by mouth daily. 07/14/16   [provider]  glucose blood (ACCU-CHEK ACTIVE STRIPS) test strip Use as instructed 03/29/16   Nafziger, Kandee Keen, NP  HYDROcodone-homatropine (HYCODAN) 5-1.5 MG/5ML syrup Take 5 mLs by mouth every 8 (eight) hours as needed for cough. 04/05/17   Nafziger, Kandee Keen, NP  Insulin Glargine (BASAGLAR KWIKPEN) 100 UNIT/ML SOPN Inject 0.25 mLs (25 Units total) into the skin at bedtime. Patient taking differently: Inject 20 Units into the skin at bedtime.  07/06/16   Angiulli, Mcarthur Rossetti, PA-C  Insulin Pen Needle (PEN NEEDLES 31GX5/16") 31G X 8 MM MISC  Use to inject insulin 5 times a day. 07/06/16   Angiulli, Mcarthur Rossetti, PA-C  isosorbide mononitrate (IMDUR) 30 MG 24 hr tablet Take 1 tablet (30 mg total) by mouth daily. 08/09/16   Nafziger, Kandee Keen, NP  Lancets (ACCU-CHEK MULTICLIX) lancets Use up to 5 times a day to check blood sugar. 07/06/16   Angiulli, Mcarthur Rossetti, PA-C  losartan (COZAAR) 50 MG tablet Take 1 tablet (50 mg total) by mouth daily. 05/02/17 04/27/18  Azalee Course, PA  metFORMIN (GLUCOPHAGE) 1000 MG tablet TAKE 1 TABLET (1,000 MG TOTAL) BY MOUTH 2 (TWO) TIMES DAILY WITH A MEAL. 12/12/16   Nafziger, Kandee Keen, NP  mirtazapine (REMERON) 15 MG tablet Take 15 mg by mouth at bedtime.    [provider]  Nebivolol HCl (BYSTOLIC) 20 MG TABS Take 1 tablet by mouth daily.    [provider]  omeprazole (PRILOSEC) 20 MG capsule TAKE ONE CAPSULE TWICE A DAY BEFORE A MEAL 05/21/17   Nafziger, Kandee Keen, NP  thiamine 100 MG tablet Take 1 tablet (100 mg total) by mouth daily. 08/09/16   Nafziger, Kandee Keen, NP  vitamin B-12 (CYANOCOBALAMIN) 1000 MCG tablet Take 2 tablets (2,000 mcg total) by mouth daily. 08/09/16   Shirline Frees, NP    Physical Exam:  Vitals:   06/03/17 0600 06/03/17 0726 06/03/17 0727 06/03/17 0839  BP: 118/66     Pulse: 69  68 66  Resp: 16  18 (!) 25  Temp:    98.1 F (36.7 C)  TempSrc:    Axillary  SpO2: 100% 100% 100% 100%  Weight:      Height:       Exam  General exam: Not in any distress. Resting but arousable.  Respiratory system: Bilateral rhonchi and mild wheeze. Respiratory effort normal. Cardiovascular system: normal s1,s2 sounds. No murmurs, rubs, gallops. Gastrointestinal system: Abdomen is nondistended, soft and nontender. No organomegaly or masses felt. Normal bowel sounds heard. Central nervous system: No gross motor deficits.. Extremities: No C/C/E, +pedal pulses Skin: No rashes, lesions or ulcers Psychiatry: Normal mood, does not appear anxious.  Cooperative  Labs on Admission: I have personally reviewed  following labs and imaging studies  CBC: Recent Labs  Lab 05/29/17 0538 05/30/17 0401 05/31/17 0738 06/02/17 0331 06/03/17 0730  WBC 11.1* 9.8 7.7 8.5 8.7  NEUTROABS 8.6*  --   --   --   --   HGB 10.1* 9.3* 9.1* 9.3* 8.9*  HCT 33.8* 31.3* 30.5* 31.0* 29.7*  MCV 100.9* 101.3* 102.0* 101.6* 101.7*  PLT 199 161 135* 173 156   Basic Metabolic Panel: Recent Labs  Lab 05/29/17 0538 05/30/17 0401 05/31/17 0738 06/02/17 0331 06/03/17 0513  NA 141 141 143 144 142  K 4.5 4.0 3.7 3.4* 3.5  CL  106 107 106 105 102  CO2 23 23 29 25 30   GLUCOSE 108* 113* 132* 126* 143*  BUN 33* 31* 32* 26* 23*  CREATININE 1.23 1.28* 1.23 1.10 1.17  CALCIUM 9.3 9.0 8.8* 9.0 8.6*  MG  --   --   --   --  1.9   GFR: Estimated Creatinine Clearance: 68.2 mL/min (by C-G formula based on SCr of 1.17 mg/dL). Liver Function Tests: Recent Labs  Lab 05/29/17 0538  AST 24  ALT 20  ALKPHOS 126  BILITOT 1.7*  PROT 6.6  ALBUMIN 3.6   No results for input(s): LIPASE, AMYLASE in the last 168 hours. No results for input(s): AMMONIA in the last 168 hours. Coagulation Profile: No results for input(s): INR, PROTIME in the last 168 hours. Cardiac Enzymes: No results for input(s): CKTOTAL, CKMB, CKMBINDEX, TROPONINI in the last 168 hours. BNP (last 3 results) No results for input(s): PROBNP in the last 8760 hours. HbA1C: No results for input(s): HGBA1C in the last 72 hours. CBG: Recent Labs  Lab 06/02/17 0749 06/02/17 1116 06/02/17 1701 06/02/17 2220 06/03/17 0741  GLUCAP 126* 106* 84 168* 137*   Lipid Profile: No results for input(s): CHOL, HDL, LDLCALC, TRIG, CHOLHDL, LDLDIRECT in the last 72 hours. Thyroid Function Tests: No results for input(s): TSH, T4TOTAL, FREET4, T3FREE, THYROIDAB in the last 72 hours. Anemia Panel: No results for input(s): VITAMINB12, FOLATE, FERRITIN, TIBC, IRON, RETICCTPCT in the last 72 hours. Urine analysis:    Component Value Date/Time   COLORURINE AMBER (A)  05/29/2017 0523   APPEARANCEUR HAZY (A) 05/29/2017 0523   LABSPEC 1.020 05/29/2017 0523   PHURINE 5.0 05/29/2017 0523   GLUCOSEU NEGATIVE 05/29/2017 0523   GLUCOSEU NEGATIVE 05/27/2007 0844   HGBUR NEGATIVE 05/29/2017 0523   HGBUR negative 05/05/2010 1041   BILIRUBINUR NEGATIVE 05/29/2017 0523   KETONESUR NEGATIVE 05/29/2017 0523   PROTEINUR 30 (A) 05/29/2017 0523   UROBILINOGEN 0.2 05/05/2010 1041   NITRITE NEGATIVE 05/29/2017 0523   LEUKOCYTESUR NEGATIVE 05/29/2017 0523    Radiological Exams on Admission: Ct Head Wo Contrast  Result Date: 06/02/2017 CLINICAL DATA:  72 year old male with unexplained altered mental status. EXAM: CT HEAD WITHOUT CONTRAST TECHNIQUE: Contiguous axial images were obtained from the base of the skull through the vertex without intravenous contrast. COMPARISON:  Brain MRI and head CT without contrast 06/28/2016 FINDINGS: Brain: Stable cerebral volume. No midline shift, ventriculomegaly, mass effect, evidence of mass lesion, intracranial hemorrhage or evidence of cortically based acute infarction. Patchy and confluent left greater than right cerebral white matter hypodensity appears stable. Chronic hypodensity in the left thalamus is stable. Hypodensity in the posterior limb of the right external capsule is new since 2018 on series 2, image 19 and sagittal image 22. No associated mass effect. Stable gray-white matter differentiation elsewhere. Vascular: Calcified atherosclerosis at the skull base. Skull: Stable.  No acute osseous abnormality identified. Sinuses/Orbits: Visualized paranasal sinuses and mastoids are stable and well pneumatized. Other: No acute orbit or scalp soft tissue findings. IMPRESSION: 1. Age indeterminate small vessel ischemia in the posterior right external capsule is new since April 2018. No associated hemorrhage or mass effect. 2. Elsewhere the noncontrast CT appearance of the brain is stable since 2018. Electronically Signed   By: Odessa Fleming M.D.    On: 06/02/2017 16:32   Mr Maxine Glenn Head Wo Contrast  Result Date: 06/03/2017 CLINICAL DATA:  72 year old male with altered mental status and confusion for 2 days. Age indeterminate small vessel ischemia in the  right external capsule on head CT yesterday. EXAM: MRI HEAD WITHOUT CONTRAST MRA HEAD WITHOUT CONTRAST TECHNIQUE: Multiplanar, multiecho pulse sequences of the brain and surrounding structures were obtained without intravenous contrast. Angiographic images of the head were obtained using MRA technique without contrast. COMPARISON:  Head CT without contrast 06/02/2017. Brain MRI and intracranial MRA 06/28/2016. FINDINGS: MRI HEAD FINDINGS The examination had to be discontinued prior to completion due to patient confusion. Axial and coronal diffusion weighted imaging, sagittal T1 and axial T2 weighted imaging only was obtained. Brain: The posterior limb right internal capsule area of hypodensity seen by CT does correspond to an oval 11 millimeter area of restricted diffusion (series 3, image 87). There is associated T2 hyperintensity, with no mass effect or evidence of associated hemorrhage. There is also a tiny additional subcortical white matter focus of restricted diffusion in the superolateral right occipital lobe bordering the posterior temporal lobe on series 3, image 85. No contralateral or posterior fossa restricted diffusion. Stable cerebral volume. Chronic lacunar infarcts in the left superior cerebellum, bilateral thalami, left corona radiata. No no midline shift, mass effect, evidence of mass lesion, ventriculomegaly, extra-axial collection or acute intracranial hemorrhage. Cervicomedullary junction and pituitary are within normal limits. Vascular: Major intracranial vascular flow voids are stable since 2018. Skull and upper cervical spine: Stable. Sinuses/Orbits: Stable. Other: Stable trace left mastoid fluid. Small volume retained secretions in the nasopharynx. MRA HEAD FINDINGS Stable antegrade  flow in the posterior circulation. Chronic irregularity and mild stenosis in the right vertebral artery V4 segment. Both PICA origins remain patent. Negative distal left vertebral artery. Patent vertebrobasilar junction and basilar artery without stenosis. SCA and PCA origins remain normal. PCA branches are stable and within normal limits. Both posterior communicating arteries are present. Stable antegrade flow in both ICA siphons. Mild siphon irregularity with no stenosis. Ophthalmic and posterior communicating artery origins remain normal. Patent carotid termini. MCA and ACA origins remain normal. The left A1 segment is mildly dominant as before. The posterior communicating scratched at the anterior communicating artery is fenestrated, normal variant. Visible ACA branches are stable and within normal limits. The left MCA M1 segment and bifurcation remain patent. There is mild irregularity and stenosis at the distal M1 and bifurcation, more affecting the anterior left M2 origin. Stable visible left MCA branches. The right MCA M1 segment and trifurcation remain patent. There is mild irregularity and stenosis at the distal right M1 which is stable (series 103, image 11). The visible right MCA branches are stable. IMPRESSION: 1. Two small acute lacunar infarcts in the right hemisphere, the larger at the right external capsule corresponding to the indeterminate CT finding yesterday. No associated hemorrhage or mass effect. 2. No intracranial artery occlusion identified. Mild intracranial atherosclerosis and stenosis in both the anterior and posterior circulation appears stable since 06/28/2016. 3. The MRI portion of the exam had to be discontinued prior to completion due to patient confusion. Electronically Signed   By: Odessa FlemingH  Hall M.D.   On: 06/03/2017 10:33   Mr Brain Limited Wo Contrast  Result Date: 06/03/2017 CLINICAL DATA:  72 year old male with altered mental status and confusion for 2 days. Age indeterminate  small vessel ischemia in the right external capsule on head CT yesterday. EXAM: MRI HEAD WITHOUT CONTRAST MRA HEAD WITHOUT CONTRAST TECHNIQUE: Multiplanar, multiecho pulse sequences of the brain and surrounding structures were obtained without intravenous contrast. Angiographic images of the head were obtained using MRA technique without contrast. COMPARISON:  Head CT without contrast 06/02/2017. Brain MRI  and intracranial MRA 06/28/2016. FINDINGS: MRI HEAD FINDINGS The examination had to be discontinued prior to completion due to patient confusion. Axial and coronal diffusion weighted imaging, sagittal T1 and axial T2 weighted imaging only was obtained. Brain: The posterior limb right internal capsule area of hypodensity seen by CT does correspond to an oval 11 millimeter area of restricted diffusion (series 3, image 87). There is associated T2 hyperintensity, with no mass effect or evidence of associated hemorrhage. There is also a tiny additional subcortical white matter focus of restricted diffusion in the superolateral right occipital lobe bordering the posterior temporal lobe on series 3, image 85. No contralateral or posterior fossa restricted diffusion. Stable cerebral volume. Chronic lacunar infarcts in the left superior cerebellum, bilateral thalami, left corona radiata. No no midline shift, mass effect, evidence of mass lesion, ventriculomegaly, extra-axial collection or acute intracranial hemorrhage. Cervicomedullary junction and pituitary are within normal limits. Vascular: Major intracranial vascular flow voids are stable since 2018. Skull and upper cervical spine: Stable. Sinuses/Orbits: Stable. Other: Stable trace left mastoid fluid. Small volume retained secretions in the nasopharynx. MRA HEAD FINDINGS Stable antegrade flow in the posterior circulation. Chronic irregularity and mild stenosis in the right vertebral artery V4 segment. Both PICA origins remain patent. Negative distal left vertebral  artery. Patent vertebrobasilar junction and basilar artery without stenosis. SCA and PCA origins remain normal. PCA branches are stable and within normal limits. Both posterior communicating arteries are present. Stable antegrade flow in both ICA siphons. Mild siphon irregularity with no stenosis. Ophthalmic and posterior communicating artery origins remain normal. Patent carotid termini. MCA and ACA origins remain normal. The left A1 segment is mildly dominant as before. The posterior communicating scratched at the anterior communicating artery is fenestrated, normal variant. Visible ACA branches are stable and within normal limits. The left MCA M1 segment and bifurcation remain patent. There is mild irregularity and stenosis at the distal M1 and bifurcation, more affecting the anterior left M2 origin. Stable visible left MCA branches. The right MCA M1 segment and trifurcation remain patent. There is mild irregularity and stenosis at the distal right M1 which is stable (series 103, image 11). The visible right MCA branches are stable. IMPRESSION: 1. Two small acute lacunar infarcts in the right hemisphere, the larger at the right external capsule corresponding to the indeterminate CT finding yesterday. No associated hemorrhage or mass effect. 2. No intracranial artery occlusion identified. Mild intracranial atherosclerosis and stenosis in both the anterior and posterior circulation appears stable since 06/28/2016. 3. The MRI portion of the exam had to be discontinued prior to completion due to patient confusion. Electronically Signed   By: Odessa Fleming M.D.   On: 06/03/2017 10:33    EKG: Independently reviewed.  Atrial fibrillation with a left bundle branch block, no significant change from prior tracings  Assessment/Plan  1. Sepsis - secondary to acute right-sided dental infection.  Clinically improving.  Patient noted to have swelling of the right mandible area.  CT confirms dental infection without any  drainable abscess. Continue IV unasyn with improvement of right face swelling.  2. Aspiration pneumonia. IV unasyn ordered.   Speech therapy consulted for swallow evaluation. 3. Paroxysmal atrial fibrillation.  Chads vas 2 score of at least 3-  continue on rate control with nebivolol and anticoagulation with Eliquis. 4. Acute CVA - I have ordered stroke work up and neurology consultation.   5. Chronic diastolic congestive heart failure.  EF on recent echo 55%.  Appears stable at this time.  Holdingf Lasix as he is not drinking well.  Weight appears to be near baseline.  Continue Imdur 6. GERD.  Continue on PPI 7. Hyperlipidemia.  Continue on statin. 8. DM type II.  Glipizide and Lantus dose has been adjusted.  Blood sugars are currently stable.  Continue to monitor. 9. Hypertension.  Continue on Bystolic, losartan 10. Acute Encephalopathy - likely related to acute CVA. Neurology consult requested.    11. Urinary retention.  Patient required Foley catheter placement.  Flomax has been added.  Will need to attempt voiding trial prior to discharge.  DVT prophylaxis: Eliquis Code Status: DNR Family Communication: wife present and updated at length, wife wants CIR placement Disposition Plan: will need nursing facility placement on discharge Consults called: None Admission status: Inpatient, MedSurg  Standley Dakins M.D on 06/03/2017 at 11:42 AM  Between 7am to 7pm - Pager - [phone]

## 2017-06-03 NOTE — Progress Notes (Signed)
SLP Cancellation Note  Patient Details Name: Luis Yu MRN: [id number] DOB: 09/03/1945   Cancelled treatment:       Reason Eval/Treat Not Completed: Patient not medically ready;Medical issues which prohibited therapy; SLP unable to see Pt for BSE at this time as Pt is on Bi-PAP and likely will be for the remainder of the day. SLP will check back later this afternoon.   Thank you,  Havery MorosDabney Porter, CCC-SLP [phone]    PORTER,DABNEY 06/03/2017, 1:37 PM
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
## 2017-06-03 NOTE — Progress Notes (Signed)
Pharmacy Antibiotic Note  Luis Yu is a 72 y.o. male admitted on 05/29/2017 with pneumonia and persistent fever, and dental infection.  Pharmacy has been consulted for Vancomycin and Zosyn dosing.  Plan: Vancomycin 750 mg IV every 12 hours.  Goal trough 15-20 mcg/mL. Zosyn 3.375g IV q8h (4 hour infusion).  Monitor labs, cultures, and vanco trough as needed.  Height: 6\' 3"  (190.5 cm) Weight: 200 lb 9.9 oz (91 kg) IBW/kg (Calculated) : 84.5  Temp (24hrs), Avg:97.8 F (36.6 C), Min:97.5 F (36.4 C), Max:98.2 F (36.8 C)  Recent Labs  Lab 05/29/17 0538 05/29/17 0545 05/29/17 0748 05/30/17 0401 05/31/17 0738 06/02/17 0331 06/03/17 0513 06/03/17 0730  WBC 11.1*  --   --  9.8 7.7 8.5  --  8.7  CREATININE 1.23  --   --  1.28* 1.23 1.10 1.17  --   LATICACIDVEN  --  1.71 1.36  --   --   --   --   --   VANCOTROUGH  --   --   --   --   --   --  14*  --     Estimated Creatinine Clearance: 68.2 mL/min (by C-G formula based on SCr of 1.17 mg/dL).    No Known Allergies  Vanco trough 3/18: 14  Antimicrobials this admission: Vanco 3/15 >>  Zosyn 3/15 >>  Unasyn 3/13 >>3/15  Dose adjustments this admission: N/A  Microbiology results: 3/13 BCx: NG x 4 days 3/13 UCx: NG  3/18 MRSA PCR: pending  Thank you for allowing pharmacy to be a part of this patient's care.  Judeth CornfieldSteven Jerrold Haskell, PharmD Clinical Pharmacist 06/03/2017 8:15 AM

## 2017-06-03 NOTE — Progress Notes (Signed)
Patient transferred to ICU with restraints intact.  Report given to Rexford MausJessica Hearn, RN.

## 2017-06-03 NOTE — Progress Notes (Signed)
Patient has become aggressive once again, contacted MD for new order for restraints, as restraints were not on patient at time of assessment at 8 PM.

## 2017-06-03 NOTE — Progress Notes (Signed)
*  PRELIMINARY RESULTS* Echocardiogram 2D Echocardiogram has been performed with Definity.  Stacey DrainWhite, Felice Deem J 06/03/2017, 4:17 PM

## 2017-06-03 NOTE — Progress Notes (Signed)
ANTICOAGULATION CONSULT NOTE - Initial Consult  Pharmacy Consult for lovenox Indication: atrial fibrillation  No Known Allergies  Patient Measurements: Height: 6\' 3"  (190.5 cm) Weight: 200 lb 9.9 oz (91 kg) IBW/kg (Calculated) : 84.5   Vital Signs: Temp: 98 F (36.7 C) (03/18 1241) Temp Source: Axillary (03/18 1241) BP: 118/66 (03/18 0600) Pulse Rate: 75 (03/18 1335)  Labs: Recent Labs    06/02/17 0331 06/03/17 0513 06/03/17 0730  HGB 9.3*  --  8.9*  HCT 31.0*  --  29.7*  PLT 173  --  156  CREATININE 1.10 1.17  --     Estimated Creatinine Clearance: 68.2 mL/min (by C-G formula based on SCr of 1.17 mg/dL).   Medical History: Past Medical History:  Diagnosis Date  . Adjustment disorder with mixed emotional features   . Atrial flutter (HCC)   . CAD (coronary artery disease)   . Depression   . Diabetes mellitus type II, uncontrolled (HCC)   . Diabetic peripheral neuropathy (HCC)   . Erectile dysfunction   . GERD (gastroesophageal reflux disease)   . Herpes zoster   . Hyperlipidemia   . Hypertension   . Obesity   . Osteoarthritis   . Postherpetic neuralgia   . Sleep apnea     Medications:  Medications Prior to Admission  Medication Sig Dispense Refill Last Dose  . acetaminophen (TYLENOL) 500 MG tablet Take 2 tablets (1,000 mg total) by mouth every 6 (six) hours as needed. (Patient taking differently: Take 1,000 mg by mouth every 6 (six) hours as needed for mild pain. ) 30 tablet 0 Past Week at Unknown time  . apixaban (ELIQUIS) 5 MG TABS tablet Take 1 tablet (5 mg total) by mouth 2 (two) times daily. 180 tablet 3 05/28/2017 at 2200  . atorvastatin (LIPITOR) 80 MG tablet Take 1 tablet (80 mg total) by mouth daily. 90 tablet 3 05/28/2017 at Unknown time  . buPROPion (WELLBUTRIN SR) 150 MG 12 hr tablet Take 1 tablet (150 mg total) by mouth 2 (two) times daily. 180 tablet 1 05/28/2017 at Unknown time  . ferrous sulfate 325 (65 FE) MG tablet Take 325 mg by mouth every  other day.   Past Week at Unknown time  . furosemide (LASIX) 20 MG tablet Take 2 tablets (40 mg total) by mouth daily. 180 tablet 3 05/28/2017 at Unknown time  . glipiZIDE (GLUCOTROL) 10 MG tablet Take 10 mg by mouth daily.   05/28/2017 at Unknown time  . Hypromellose (ARTIFICIAL TEARS OP) Apply 1 drop to eye every 2 (two) hours as needed (Dryness).   05/28/2017 at Unknown time  . insulin aspart (NOVOLOG) 100 UNIT/ML injection Inject 5-15 Units into the skin 3 (three) times daily before meals. Per sliding scale   05/28/2017 at Unknown time  . Insulin Glargine (BASAGLAR KWIKPEN) 100 UNIT/ML SOPN Inject 0.25 mLs (25 Units total) into the skin at bedtime. (Patient taking differently: Inject 20 Units into the skin at bedtime. ) 3 pen 11 05/28/2017 at Unknown time  . isosorbide mononitrate (IMDUR) 30 MG 24 hr tablet Take 1 tablet (30 mg total) by mouth daily. 90 tablet 3 05/28/2017 at Unknown time  . losartan (COZAAR) 50 MG tablet Take 1 tablet (50 mg total) by mouth daily. 90 tablet 3 05/28/2017 at Unknown time  . metFORMIN (GLUCOPHAGE) 1000 MG tablet TAKE 1 TABLET (1,000 MG TOTAL) BY MOUTH 2 (TWO) TIMES DAILY WITH A MEAL. 180 tablet 1 05/28/2017 at Unknown time  . mirtazapine (REMERON) 15 MG tablet  Take 15 mg by mouth at bedtime.   05/28/2017 at Unknown time  . Nebivolol HCl (BYSTOLIC) 20 MG TABS Take 1 tablet by mouth daily.   05/28/2017 at Unknown time  . omeprazole (PRILOSEC) 20 MG capsule TAKE ONE CAPSULE TWICE A DAY BEFORE A MEAL 180 capsule 0 05/28/2017 at Unknown time  . penicillin v potassium (VEETID) 500 MG tablet Take 1,000 mg by mouth daily as needed (Swelling).    05/28/2017 at Unknown time  . thiamine 100 MG tablet Take 1 tablet (100 mg total) by mouth daily. 90 tablet 3 05/28/2017 at Unknown time  . vitamin B-12 (CYANOCOBALAMIN) 1000 MCG tablet Take 2 tablets (2,000 mcg total) by mouth daily. 180 tablet 3 05/28/2017 at Unknown time    Assessment: Patient previously on apixaban for atrial fibrillation  but currently unable to swallow.  Pharmacy consulted to start lovenox until able to take apixaban again.  Goal of Therapy:  lovenox 1 mg/kg subq Q 12 hours for atrial fibrillation Monitor platelets by anticoagulation protocol: Yes   Plan:  Lovenox 90 mg subq Q12 hours Monitor labs and s/s of bleeding  Tad MooreSteven C Khang Hannum 06/03/2017,2:47 PM

## 2017-06-03 NOTE — Progress Notes (Signed)
Patient appears to be doing better. He has extreme sleep apnea. He has been having trouble sleeping since being admitted to hospital. As time has passed he has become more confused and has been given med's  for his confusion this most likely has exacerbated his sleep problems. He has periods of 40 or more seconds of apnea. At which time he awakens and tries to breath. This has been going on for days most likely. Hopefully BiPAP will solve his problem with sleep. He wears a AVaps BiPAP at home but has not been wearing it due to having to take lasix and getting up to urinate. This has been reported by wife will continue to monitor.

## 2017-06-04 ENCOUNTER — Inpatient Hospital Stay (HOSPITAL_COMMUNITY): Payer: Medicare Other

## 2017-06-04 ENCOUNTER — Encounter (HOSPITAL_COMMUNITY): Payer: Self-pay | Admitting: *Deleted

## 2017-06-04 LAB — BASIC METABOLIC PANEL
Anion gap: 10 (ref 5–15)
BUN: 25 mg/dL — ABNORMAL HIGH (ref 6–20)
CO2: 27 mmol/L (ref 22–32)
Calcium: 8.7 mg/dL — ABNORMAL LOW (ref 8.9–10.3)
Chloride: 106 mmol/L (ref 101–111)
Creatinine, Ser: 0.92 mg/dL (ref 0.61–1.24)
GFR calc Af Amer: >60 mL/min (ref >60)
GFR calc non Af Amer: >60 mL/min (ref >60)
Glucose, Bld: 115 mg/dL — ABNORMAL HIGH (ref 65–99)
Potassium: 3.7 mmol/L (ref 3.5–5.1)
Sodium: 143 mmol/L (ref 135–145)

## 2017-06-04 LAB — GLUCOSE, CAPILLARY
Glucose-Capillary: 101 mg/dL — ABNORMAL HIGH (ref 65–99)
Glucose-Capillary: 113 mg/dL — ABNORMAL HIGH (ref 65–99)
Glucose-Capillary: 187 mg/dL — ABNORMAL HIGH (ref 65–99)
Glucose-Capillary: 197 mg/dL — ABNORMAL HIGH (ref 65–99)

## 2017-06-04 LAB — CBC
HCT: 29.7 % — ABNORMAL LOW (ref 39.0–52.0)
Hemoglobin: 9.1 g/dL — ABNORMAL LOW (ref 13.0–17.0)
MCH: 31 pg (ref 26.0–34.0)
MCHC: 30.6 g/dL (ref 30.0–36.0)
MCV: 101 fL — ABNORMAL HIGH (ref 78.0–100.0)
Platelets: 152 10*3/uL (ref 150–400)
RBC: 2.94 MIL/uL — ABNORMAL LOW (ref 4.22–5.81)
RDW: 17 % — ABNORMAL HIGH (ref 11.5–15.5)
WBC: 9.2 10*3/uL (ref 4.0–10.5)

## 2017-06-04 MED ORDER — HALOPERIDOL LACTATE 5 MG/ML IJ SOLN
2.0000 mg | Freq: Once | INTRAMUSCULAR | Status: AC
  Administered 2017-06-04: 2 mg via INTRAVENOUS
  Filled 2017-06-04: qty 1

## 2017-06-04 MED ORDER — STARCH (THICKENING) PO POWD
ORAL | Status: DC | PRN
  Filled 2017-06-04: qty 227

## 2017-06-04 MED ORDER — SENNOSIDES-DOCUSATE SODIUM 8.6-50 MG PO TABS
1.0000 | ORAL_TABLET | Freq: Two times a day (BID) | ORAL | Status: DC
  Administered 2017-06-04: 1 via ORAL
  Filled 2017-06-04 (×2): qty 1

## 2017-06-04 NOTE — Progress Notes (Signed)
Modified Barium Swallow Progress Note  Patient Details  Name: Luis Yu MRN: [id number] Date of Birth: 30-Apr-1945  Today's Date: 06/04/2017  Modified Barium Swallow completed.  Full report located under Chart Review in the Imaging Section.  Brief recommendations include the following:  Clinical Impression  Pt presents with moderate pharyngeal phase dysphagia characterized by delay in swallow initiation with swallow trigger after filling the valleculae, reduced hyolaryngeal excursion and epiglottic deflection resulting in penetration during and after the swallow with thins, nectars- trace, and slight with honey-thick liquids. Chin tuck was implemented with mod/max verbal and tactile cues and was consistently effective in preventing aspiration with thin liquids, however Pt unable to complete without cues due to cognitive status. Pt with trace aspiration with large cups sips and straw sips after the swallow from penetrant moving down anterior aspect of vocal folds; penetration occurred in greater amounts with straw sips thin and was not removed until SLP cued Pt for strong cough. Given current PNA and altered mental status, will change diet to nectar-thick liquids and continue regular textures with follow up dysphagia intervention at receiving facility. SLP has not been able to speak with Pt's wife yet, however chart review reveals that wife has noted frequent coughing with meals in the recent past. Prognosis to upgrade to thin liquids is good post discharge as Pt has a very strong and effective cough, but needs cues to do so. A chin tuck was effective with thin liquids and can be used if Pt able to successfully implement (he is not able to at this time due to cognitive status). Pharyngeal phase of swallow was WNL with puree and regular textures. Recommend Regular textures and nectar-thick liquids with po meds whole one at a time with NTL or in puree. OK for thin liquid trials with SLP with strict use of  chin tuck.    Swallow Evaluation Recommendations       SLP Diet Recommendations: Regular solids;Nectar thick liquid   Liquid Administration via: Cup;No straw   Medication Administration: Whole meds with liquid   Supervision: Patient able to self feed;Full supervision/cueing for compensatory strategies   Compensations: Slow rate;Small sips/bites;Clear throat intermittently   Postural Changes: Seated upright at 90 degrees;Remain semi-upright after after feeds/meals (Comment)   Oral Care Recommendations: Oral care BID   Other Recommendations: Clarify dietary restrictions   Thank you,  Havery MorosDabney Abshir Paolini, CCC-SLP [phone]  Luis Yu 06/04/2017,6:23 PM
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
## 2017-06-04 NOTE — Consult Note (Signed)
Luis A. Merlene Laughter, MD     [email or web address]          Luis Yu is an 72 y.o. male.   ASSESSMENT/PLAN: Resolving encephalopathy:  This is most likely multifactorial due to acute medical illness, the dental abscess with cellulitis and pneumonia.  No clear indication of primary CNS infection or endocarditis and therefore additional workup is not suggested at this time.  Embolic stroke in the setting of atrial fibrillation and acute illness with sepsis.  Agree with anticoagulation.  Multiple infarcts both acute and chronic with increased risk of vascular dementia:    This is 72 year old white male who presents with fever and shaking along with facial swelling.  It appears the patient had the dental abscess associated with cellulitis.  He has been worked up extensively.  Appears up blood cultures have not been positive.  He has had x-[place] showing the pneumonia presumably due to aspiration.  Patient has been encephalopathic and agitated which resulted in the current consultation.  He had a stroke about a year ago.  The wife reports that he had significant right-sided weakness with a stroke and did have some residual problems from this.  She tells me that he did have a footdrop on the right related to his stroke although he has had some neuropathy which may have contributed.  Neuropathy is a more longstanding issue than the recent stroke a year ago.  He is noted to have a mild dysarthria which wife tells me fluctuates and is probably his baseline.  The patient's agitation have been occurring during the day and also at nighttime but may have been worse at nighttime and worse with of his CPAP machine while he has been hospitalized.  He appears to have improved significantly this morning however.  She reports that he is pretty close to baseline.  The patient himself has little recollection of the events while he is hospitalized in the for canal provide any history.  However, the  wife does not report any clear focal new symptoms such is worsening dysarthria or worsening weakness on the right side.  No left-sided weakness is reported.  No visual changes are reported.  The review of systems otherwise negative.  GENERAL:  This is a pleasant male who is in no acute distress.  HEENT:  He does appears to have mild dysarthria and mild excessive salivation.  He appears to be significantly hearing impaired.  ABDOMEN: Soft  EXTREMITIES: No edema   BACK: Normal alignment.  SKIN: Normal by inspection.    MENTAL STATUS:  He is awake and alert.  Again there is mild dysarthria.  He does follow commands with prompting.  He has decent but reduced spontaneous speech.  CRANIAL NERVES: Pupils are equal, round and reactive to light and accommodation; extraocular movements are full, there is no significant nystagmus; upper and lower facial muscles are normal in strength and symmetric, there is no flattening of the nasolabial folds; tongue is midline; uvula is midline; shoulder elevation is normal.  Visual fields are intact.  MOTOR:  Right dorsiflexion is 4/5.  Right hip flexion 5. Right upper extremity 5.  Left side shows normal tone, bulk and strength.  COORDINATION: Left finger to nose is normal, right finger to nose is normal, No rest tremor; no intention tremor; no postural tremor; no bradykinesia.  REFLEXES: Deep tendon reflexes are symmetrical and normal. Plantar responses are flexor bilaterally.   SENSATION: Normal to light touch and temperature.  Blood pressure (!) 145/74, pulse 75, temperature 98.5 F (36.9 C), temperature source Oral, resp. rate (!) 25, height '6\' 3"'  (1.905 m), weight 214 lb 15.2 oz (97.5 kg), SpO2 97 %.  Past Medical History:  Diagnosis Date  . Adjustment disorder with mixed emotional features   . Atrial flutter ([place])   . CAD (coronary artery disease)   . Depression   . Diabetes mellitus type II, uncontrolled ([place])   . Diabetic peripheral  neuropathy ([place])   . Erectile dysfunction   . GERD (gastroesophageal reflux disease)   . Herpes zoster   . Hyperlipidemia   . Hypertension   . Obesity   . Osteoarthritis   . Postherpetic neuralgia   . Sleep apnea     Past Surgical History:  Procedure Laterality Date  . CARDIOVERSION N/A 11/10/2015   Procedure: CARDIOVERSION;  Surgeon: Larey Dresser, MD;  Location: [facility];  Service: Cardiovascular;  Laterality: N/A;  . CARDIOVERSION N/A 02/21/2016   Procedure: CARDIOVERSION;  Surgeon: Lelon Perla, MD;  Location: [facility] ENDOSCOPY;  Service: Cardiovascular;  Laterality: N/A;  . CORONARY ARTERY BYPASS GRAFT  1996   eith a LIMA to the LAD, saphenous vein graft to the acute marginal, saphenous vein graft to the PDA and saphenous vein graft to the circumflex.  [name] KNEE ARTHROSCOPY  05/2004   right knee  . REPLACEMENT TOTAL KNEE BILATERAL    . SHOULDER SURGERY      Family History  Problem Relation Age of Onset  . Coronary artery disease Mother        deceased at 41  . Coronary artery disease Father        died age 77  . Stroke Unknown        half brother  . Stroke Brother     Social History:  reports that  has never smoked. he has never used smokeless tobacco. He reports that he drinks about 1.2 oz of alcohol per week. He reports that he does not use drugs.  Allergies: No Known Allergies  Medications: Prior to Admission medications   Medication Sig Start Date End Date Taking? Authorizing Provider  acetaminophen (TYLENOL) 500 MG tablet Take 2 tablets (1,000 mg total) by mouth every 6 (six) hours as needed. Patient taking differently: Take 1,000 mg by mouth every 6 (six) hours as needed for mild pain.  05/24/16  Yes Charlesetta Shanks, MD  apixaban (ELIQUIS) 5 MG TABS tablet Take 1 tablet (5 mg total) by mouth 2 (two) times daily. 08/09/16  Yes Nafziger, Tommi Rumps, NP  atorvastatin (LIPITOR) 80 MG tablet Take 1 tablet (80 mg total) by mouth daily. 04/03/17  Yes Nafziger, Tommi Rumps, NP    buPROPion (WELLBUTRIN SR) 150 MG 12 hr tablet Take 1 tablet (150 mg total) by mouth 2 (two) times daily. 04/02/17  Yes Nafziger, Tommi Rumps, NP  ferrous sulfate 325 (65 FE) MG tablet Take 325 mg by mouth every other day.   Yes [provider]  furosemide (LASIX) 20 MG tablet Take 2 tablets (40 mg total) by mouth daily. 04/24/17 07/23/17 Yes Lelon Perla, MD  glipiZIDE (GLUCOTROL) 10 MG tablet Take 10 mg by mouth daily. 07/14/16  Yes [provider]  Hypromellose (ARTIFICIAL TEARS OP) Apply 1 drop to eye every 2 (two) hours as needed ([place]).   Yes [provider]  insulin aspart (NOVOLOG) 100 UNIT/ML injection Inject 5-15 Units into the skin 3 (three) times daily before meals. Per sliding scale   Yes [provider]  Insulin Glargine (BASAGLAR KWIKPEN) 100 UNIT/ML SOPN Inject 0.25 mLs (25 Units total) into the skin at bedtime. Patient taking differently: Inject 20 Units into the skin at bedtime.  07/06/16  Yes Angiulli, Lavon Paganini, PA-C  isosorbide mononitrate (IMDUR) 30 MG 24 hr tablet Take 1 tablet (30 mg total) by mouth daily. 08/09/16  Yes Nafziger, Tommi Rumps, NP  losartan (COZAAR) 50 MG tablet Take 1 tablet (50 mg total) by mouth daily. 05/02/17 04/27/18 Yes Meng, Isaac Laud, PA  metFORMIN (GLUCOPHAGE) 1000 MG tablet TAKE 1 TABLET (1,000 MG TOTAL) BY MOUTH 2 (TWO) TIMES DAILY WITH A MEAL. 12/12/16  Yes Nafziger, Tommi Rumps, NP  mirtazapine (REMERON) 15 MG tablet Take 15 mg by mouth at bedtime.   Yes [provider]  Nebivolol HCl (BYSTOLIC) 20 MG TABS Take 1 tablet by mouth daily.   Yes [provider]  omeprazole (PRILOSEC) 20 MG capsule TAKE ONE CAPSULE TWICE A DAY BEFORE A MEAL 05/21/17  Yes Nafziger, Tommi Rumps, NP  penicillin v potassium (VEETID) 500 MG tablet Take 1,000 mg by mouth daily as needed (Swelling).    Yes [provider]  thiamine 100 MG tablet Take 1 tablet (100 mg total) by mouth daily. 08/09/16  Yes Nafziger, Tommi Rumps, NP  vitamin B-12 (CYANOCOBALAMIN)  1000 MCG tablet Take 2 tablets (2,000 mcg total) by mouth daily. 08/09/16  Yes Nafziger, Tommi Rumps, NP    Scheduled Meds: . atorvastatin  80 mg Oral QHS  . bisacodyl  10 mg Rectal Once  . buPROPion  150 mg Oral BID  . enoxaparin (LOVENOX) injection  90 mg Subcutaneous Q12H  . insulin aspart  0-15 Units Subcutaneous TID WC  . insulin glargine  10 Units Subcutaneous QHS  . ipratropium-albuterol  3 mL Nebulization TID  . isosorbide mononitrate  30 mg Oral Daily  . losartan  50 mg Oral Daily  . mirtazapine  15 mg Oral QHS  . nebivolol  20 mg Oral QHS  . pantoprazole  40 mg Oral BID AC  . QUEtiapine  25 mg Oral QHS  . tamsulosin  0.4 mg Oral Daily  . thiamine  100 mg Oral Daily  . vitamin B-12  2,000 mcg Oral QHS   Continuous Infusions: . sodium chloride 50 mL/hr at 06/04/17 0400  . ampicillin-sulbactam (UNASYN) IV Stopped (06/04/17 0530)   PRN Meds:.acetaminophen **OR** acetaminophen, albuterol, haloperidol lactate, [DISCONTINUED] ondansetron **OR** ondansetron (ZOFRAN) IV     Results for orders placed or performed during the hospital encounter of 05/29/17 (from the past 48 hour(s))  Glucose, capillary     Status: Abnormal   Collection Time: 06/02/17 11:16 AM  Result Value Ref Range   Glucose-Capillary 106 (H) 65 - 99 mg/dL  Glucose, capillary     Status: None   Collection Time: 06/02/17  5:01 PM  Result Value Ref Range   Glucose-Capillary 84 65 - 99 mg/dL   Comment 1 Notify RN    Comment 2 Document in Chart   Glucose, capillary     Status: Abnormal   Collection Time: 06/02/17 10:20 PM  Result Value Ref Range   Glucose-Capillary 168 (H) 65 - 99 mg/dL   Comment 1 Notify RN    Comment 2 Document in Chart   MRSA PCR Screening     Status: None   Collection Time: 06/03/17  4:50 AM  Result Value Ref Range   MRSA by PCR NEGATIVE NEGATIVE    Comment:        The GeneXpert MRSA Assay (FDA approved for NASAL  specimens only), is one component of a comprehensive MRSA  colonization surveillance program. It is not intended to diagnose MRSA infection nor to guide or monitor treatment for MRSA infections. Performed at [facility], [address] [place], [place] [address]   Basic metabolic panel     Status: Abnormal   Collection Time: 06/03/17  5:13 AM  Result Value Ref Range   Sodium 142 135 - 145 mmol/L   Potassium 3.5 3.5 - 5.1 mmol/L   Chloride 102 101 - 111 mmol/L   CO2 30 22 - 32 mmol/L   Glucose, Bld 143 (H) 65 - 99 mg/dL   BUN 23 (H) 6 - 20 mg/dL   Creatinine, Ser 1.17 0.61 - 1.24 mg/dL   Calcium 8.6 (L) 8.9 - 10.3 mg/dL   GFR calc non Af Amer >60 >60 mL/min   GFR calc Af Amer >60 >60 mL/min    Comment: (NOTE) The eGFR has been calculated using the CKD EPI equation. This calculation has not been validated in all clinical situations. eGFR's persistently <60 mL/min signify possible Chronic Kidney Disease.    Anion gap 10 5 - 15    Comment: Performed at [facility], [address] [place], [place] [address]  Magnesium     Status: None   Collection Time: 06/03/17  5:13 AM  Result Value Ref Range   Magnesium 1.9 1.7 - 2.4 mg/dL    Comment: Performed at [facility], [address] [place], [place] [address]  Vancomycin, trough     Status: Abnormal   Collection Time: 06/03/17  5:13 AM  Result Value Ref Range   Vancomycin Tr 14 (L) 15 - 20 ug/mL    Comment: Performed at [facility], [address], [place], [place] [address]  Hemoglobin A1c     Status: None   Collection Time: 06/03/17  5:13 AM  Result Value Ref Range   Hgb A1c MFr Bld 5.2 4.8 - 5.6 %    Comment: (NOTE) Pre diabetes:          5.7%-6.4% Diabetes:              >6.4% Glycemic control for   <7.0% adults with diabetes    Mean Plasma Glucose 102.54 mg/dL    Comment: Performed at [facility] [address], [place], [place] [address]  Lipid panel     Status: Abnormal   Collection Time: 06/03/17  5:13 AM  Result Value Ref Range   Cholesterol 96 0 - 200 mg/dL    Triglycerides 72 <150 mg/dL   HDL 24 (L) >40 mg/dL   Total CHOL/HDL Ratio 4.0 RATIO   VLDL 14 0 - 40 mg/dL   LDL Cholesterol 58 0 - 99 mg/dL    Comment:        Total Cholesterol/HDL:CHD Risk Coronary Heart Disease Risk Table                     Men   Women  1/2 Average Risk   3.4   3.3  Average Risk       5.0   4.4  2 X Average Risk   9.6   7.1  3 X Average Risk  23.4   11.0        Use the calculated Patient Ratio above and the CHD Risk Table to determine the patient's CHD Risk.        ATP III CLASSIFICATION (LDL):  <100     mg/dL  Optimal  100-129  mg/dL   Near or Above                    Optimal  130-159  mg/dL   Borderline  160-189  mg/dL   High  >190     mg/dL   Very High Performed at [facility], [address] [place], [place] [address]   CBC     Status: Abnormal   Collection Time: 06/03/17  7:30 AM  Result Value Ref Range   WBC 8.7 4.0 - 10.5 K/uL   RBC 2.92 (L) 4.22 - 5.81 MIL/uL   Hemoglobin 8.9 (L) 13.0 - 17.0 g/dL   HCT 29.7 (L) 39.0 - 52.0 %   MCV 101.7 (H) 78.0 - 100.0 fL   MCH 30.5 26.0 - 34.0 pg   MCHC 30.0 30.0 - 36.0 g/dL   RDW 17.9 (H) 11.5 - 15.5 %   Platelets 156 150 - 400 K/uL    Comment: Performed at [facility], [address] [place], [place] [address]  Glucose, capillary     Status: Abnormal   Collection Time: 06/03/17  7:41 AM  Result Value Ref Range   Glucose-Capillary 137 (H) 65 - 99 mg/dL  Glucose, capillary     Status: None   Collection Time: 06/03/17 12:22 PM  Result Value Ref Range   Glucose-Capillary 88 65 - 99 mg/dL  Glucose, capillary     Status: Abnormal   Collection Time: 06/03/17  4:53 PM  Result Value Ref Range   Glucose-Capillary 100 (H) 65 - 99 mg/dL  Glucose, capillary     Status: Abnormal   Collection Time: 06/03/17  9:10 PM  Result Value Ref Range   Glucose-Capillary 122 (H) 65 - 99 mg/dL  CBC     Status: Abnormal   Collection Time: 06/04/17  3:43 AM  Result Value Ref Range   WBC 9.2 4.0 - 10.5 K/uL   RBC 2.94  (L) 4.22 - 5.81 MIL/uL   Hemoglobin 9.1 (L) 13.0 - 17.0 g/dL   HCT 29.7 (L) 39.0 - 52.0 %   MCV 101.0 (H) 78.0 - 100.0 fL   MCH 31.0 26.0 - 34.0 pg   MCHC 30.6 30.0 - 36.0 g/dL   RDW 17.0 (H) 11.5 - 15.5 %   Platelets 152 150 - 400 K/uL    Comment: Performed at [facility], [address], [place], [place] [address]  Basic metabolic panel     Status: Abnormal   Collection Time: 06/04/17  3:43 AM  Result Value Ref Range   Sodium 143 135 - 145 mmol/L   Potassium 3.7 3.5 - 5.1 mmol/L   Chloride 106 101 - 111 mmol/L   CO2 27 22 - 32 mmol/L   Glucose, Bld 115 (H) 65 - 99 mg/dL   BUN 25 (H) 6 - 20 mg/dL   Creatinine, Ser 0.92 0.61 - 1.24 mg/dL   Calcium 8.7 (L) 8.9 - 10.3 mg/dL   GFR calc non Af Amer >60 >60 mL/min   GFR calc Af Amer >60 >60 mL/min    Comment: (NOTE) The eGFR has been calculated using the CKD EPI equation. This calculation has not been validated in all clinical situations. eGFR's persistently <60 mL/min signify possible Chronic Kidney Disease.    Anion gap 10 5 - 15    Comment: Performed at [facility], [address] [place], [place] [address]  Glucose, capillary     Status: Abnormal   Collection Time: 06/04/17  7:41 AM  Result Value Ref Range   Glucose-Capillary 101 (H) 65 - 99 mg/dL    Studies/Results:  BRAIN MRI MRA CLINICAL DATA:  72 year old male with altered mental status and confusion for 2 days. Age indeterminate small vessel ischemia in the right external capsule on head CT yesterday.  EXAM: MRI HEAD WITHOUT CONTRAST  MRA HEAD WITHOUT CONTRAST  TECHNIQUE: Multiplanar, multiecho pulse sequences of the brain and surrounding structures were obtained without intravenous contrast. Angiographic images of the head were obtained using MRA technique without contrast.  COMPARISON:  Head CT without contrast 06/02/2017. Brain MRI and intracranial MRA 06/28/2016.  FINDINGS: MRI HEAD FINDINGS  The examination had to be discontinued prior to  completion due to patient confusion. Axial and coronal diffusion weighted imaging, sagittal T1 and axial T2 weighted imaging only was obtained.  Brain: The posterior limb right internal capsule area of hypodensity seen by CT does correspond to an oval 11 millimeter area of restricted diffusion (series 3, image 87). There is associated T2 hyperintensity, with no mass effect or evidence of associated hemorrhage.  There is also a tiny additional subcortical white matter focus of restricted diffusion in the superolateral right occipital lobe bordering the posterior temporal lobe on series 3, image 85. No contralateral or posterior fossa restricted diffusion.  Stable cerebral volume. Chronic lacunar infarcts in the left superior cerebellum, bilateral thalami, left corona radiata. No no midline shift, mass effect, evidence of mass lesion, ventriculomegaly, extra-axial collection or acute intracranial hemorrhage. Cervicomedullary junction and pituitary are within normal limits.  Vascular: Major intracranial vascular flow voids are stable since 2018.  Skull and upper cervical spine: Stable.  Sinuses/Orbits: Stable.  Other: Stable trace left mastoid fluid. Small volume retained secretions in the nasopharynx.  MRA HEAD FINDINGS  Stable antegrade flow in the posterior circulation. Chronic irregularity and mild stenosis in the right vertebral artery V4 segment. Both PICA origins remain patent. Negative distal left vertebral artery. Patent vertebrobasilar junction and basilar artery without stenosis. SCA and PCA origins remain normal. PCA branches are stable and within normal limits. Both posterior communicating arteries are present.  Stable antegrade flow in both ICA siphons. Mild siphon irregularity with no stenosis. Ophthalmic and posterior communicating artery origins remain normal. Patent carotid termini. MCA and ACA origins remain normal. The left A1 segment is mildly  dominant as before. The posterior communicating scratched at the anterior communicating artery is fenestrated, normal variant. Visible ACA branches are stable and within normal limits.  The left MCA M1 segment and bifurcation remain patent. There is mild irregularity and stenosis at the distal M1 and bifurcation, more affecting the anterior left M2 origin. Stable visible left MCA branches.  The right MCA M1 segment and trifurcation remain patent. There is mild irregularity and stenosis at the distal right M1 which is stable (series 103, image 11). The visible right MCA branches are stable.  IMPRESSION: 1. Two small acute lacunar infarcts in the right hemisphere, the larger at the right external capsule corresponding to the indeterminate CT finding yesterday. No associated hemorrhage or mass effect. 2. No intracranial artery occlusion identified. Mild intracranial atherosclerosis and stenosis in both the anterior and posterior circulation appears stable since 06/28/2016. 3. The MRI portion of the exam had to be discontinued prior to completion due to patient confusion.    brain MRI is reviewed in person.there is a bright signal seen on DWI involving the posterior limb of internal capsule extending into the basal ganglia seen on a few cuts mild to  moderate in size. This is on the right side. There is a fainter's tiny lesion also DWI involving right atria. MRA shows no large vessel occlusive disease.     Echocardiography: - Left ventricle: Difficult to assess LVEF even with Definity LVEF   is depressed at approximately 40% with basal inferior akinesis;   hypokinesis elsewhere. The cavity size was normal. Wall thickness   was increased in a pattern of moderate LVH. - Left atrium: The atrium was moderately to severely dilated. - Right ventricle: RV is mildly dilated and RVEF appeasrs mildly   reduced. - Right atrium: The atrium was mildly dilated.  Kalyn Dimattia A. Merlene Yu, M.D.   Diplomate, Tax adviser of Psychiatry and Neurology ( Neurology). 06/04/2017, 8:30 AM

## 2017-06-04 NOTE — Progress Notes (Signed)
Patient did not wish to wear BIPAP machine tonight. Unit at bedside on standby if needed. Patient resting well at this time.

## 2017-06-04 NOTE — Plan of Care (Signed)
  No Outcome Acute Rehab OT Goals (only OT should resolve) Pt. Will Perform Grooming 06/04/2017 1640 by Geralynn Ochsroxler, Leslie A, OT Flowsheets Taken 06/04/2017 1640  Pt Will Perform Grooming with supervision;standing Pt. Will Transfer To Toilet 06/04/2017 1640 by Geralynn Ochsroxler, Leslie A, OT Flowsheets Taken 06/04/2017 1640  Pt Will Transfer to Toilet with supervision;ambulating;regular height toilet;bedside commode Pt. Will Perform Toileting-Clothing Manipulation 06/04/2017 1640 by Geralynn Ochsroxler, Leslie A, OT Flowsheets Taken 06/04/2017 1640  Pt Will Perform Toileting - Clothing Manipulation and hygiene with supervision;sitting/lateral leans;sit to/from stand Pt/Caregiver Will Perform Home Exercise Program 06/04/2017 1640 by Geralynn Ochsroxler, Leslie A, OT Flowsheets Taken 06/04/2017 1640  Pt/caregiver will Perform Home Exercise Program Increased strength;Both right and left upper extremity;Independently;With written HEP provided

## 2017-06-04 NOTE — Progress Notes (Signed)
Physical Therapy Treatment Patient Details Name: Luis Yu MRN: [id number] DOB: March 08, 1946 Today's Date: 06/04/2017    History of Present Illness Luis Yu is a 72 y.o. male s/p acute CVA after admission with medical history significant of paroxysmal atrial fibrillation on Eliquis, chronic diastolic congestive heart failure, recurrent dental infections, presented to the hospital with fever.  Patient has had a prior stroke and has some residual cognitive effects.  History is mostly obtained from his wife.  She reports that he was in his usual state of health yesterday.  This morning he woke up confused, having chills.  He was noted to be trembling and shaking.  He began coughing.  No vomiting or diarrhea.  He was noted to be lethargic/confused.  He was brought to the emergency room for evaluation where he was noted to be mildly hypotensive/tachycardic.  He received IV fluids with correction of hemodynamics.  He was noted to be febrile with a temperature of 102.  He received Tylenol with improvement of fever.  Imaging has shown evidence of right sided dental infection.  Urinalysis and chest x-Whitenight are unrevealing.  Blood cultures have been sent.  Influenza panel is negative.  Patient is been referred for admission.    PT Comments    Patient re-assessed after + MR for CVA (see diagnostics).  No significant difference in strength right side vs. Left, coordination WNL, no c/o dizziness, numbness or tingling.  Patient presents much more cooperative with mentation at baseline per patient's spouse who was present during treatment.  Patient demonstrates labored movement for sitting up, standing and tends to lean over RW requiring VC's to erect trunk with fair/good carryover, limited for gait training mostly due to SOB/fatigue and tolerated sitting up in chair after therapy.   Follow Up Recommendations  CIR     Equipment Recommendations  None recommended by PT    Recommendations for Other Services        Precautions / Restrictions Precautions Precautions: Fall Restrictions Weight Bearing Restrictions: No    Mobility  Bed Mobility Overal bed mobility: Needs Assistance Bed Mobility: Supine to Sit     Supine to sit: Min assist     General bed mobility comments: had difficulty propping up on elbows  Transfers Overall transfer level: Needs assistance Equipment used: Rolling walker (2 wheeled) Transfers: Sit to/from [employer] CorporationStand;Stand Pivot Transfers Sit to Stand: Min assist Stand pivot transfers: Min assist;Mod assist       General transfer comment: leans over RW due to fatigue  Ambulation/Gait Ambulation/Gait assistance: Mod assist Ambulation Distance (Feet): 15 Feet Assistive device: Rolling walker (2 wheeled) Gait Pattern/deviations: Decreased step length - right;Decreased step length - left;Decreased stride length   Gait velocity interpretation: Below normal speed for age/gender General Gait Details: able to take steps in room with VC's to erect trunk due to leaning over RW, limited mostly due to SOB, on room air with O2 saturation at 93-97%   Stairs            Wheelchair Mobility    Modified Rankin (Stroke Patients Only)       Balance Overall balance assessment: Needs assistance Sitting-balance support: Feet supported;No upper extremity supported Sitting balance-Leahy Scale: Good     Standing balance support: Bilateral upper extremity supported;During functional activity Standing balance-Leahy Scale: Fair                              Cognition Arousal/Alertness: Awake/alert Behavior During  Therapy: WFL for tasks assessed/performed Overall Cognitive Status: Within Functional Limits for tasks assessed                                        Exercises      General Comments        Pertinent Vitals/Pain Pain Assessment: No/denies pain    Home Living                      Prior Function            PT  Goals (current goals can now be found in the care plan section) Acute Rehab PT Goals Patient Stated Goal: rehab then home PT Goal Formulation: With patient/family Time For Goal Achievement: 06/05/2017 Potential to Achieve Goals: Good Progress towards PT goals: Progressing toward goals    Frequency    7X/week      PT Plan Current plan remains appropriate    Co-evaluation              AM-PAC PT "6 Clicks" Daily Activity  Outcome Measure  Difficulty turning over in bed (including adjusting bedclothes, sheets and blankets)?: A Little Difficulty moving from lying on back to sitting on the side of the bed? : A Little Difficulty sitting down on and standing up from a chair with arms (e.g., wheelchair, bedside commode, etc,.)?: A Little Help needed moving to and from a bed to chair (including a wheelchair)?: A Little Help needed walking in hospital room?: A Lot Help needed climbing 3-5 steps with a railing? : Total 6 Click Score: 15    End of Session Equipment Utilized During Treatment: Gait belt Activity Tolerance: Patient tolerated treatment well;Patient limited by fatigue(Limited secondary to SOB) Patient left: in chair;with call bell/phone within reach;with family/visitor present Nurse Communication: Mobility status;Other (comment)(RN notified patient left up in chair) PT Visit Diagnosis: Unsteadiness on feet (R26.81);Other abnormalities of gait and mobility (R26.89);Muscle weakness (generalized) (M62.81)     Time: 1914-7829 PT Time Calculation (min) (ACUTE ONLY): 30 min  Charges:  $Therapeutic Activity: 23-37 mins                    G Codes:       12:17 PM, 06-05-2017 Ocie Bob, MPT Physical Therapist with [facility] [phone] office [phone] mobile phone

## 2017-06-04 NOTE — Progress Notes (Signed)
[facility] re[facility] admissions - I received a consult for possible acute inpatient rehab admission.  I would like to see OT evaluation if available and then can follow up with case manager for rehab needs.  I will follow up in am.  Call me for questions.  #[phone]

## 2017-06-04 NOTE — Evaluation (Signed)
Clinical/Bedside Swallow Evaluation Patient Details  Name: Luis Yu MRN: [id number] Date of Birth: 07-04-1945  Today's Date: 06/04/2017 Time: SLP Start Time (ACUTE ONLY): 1300 SLP Stop Time (ACUTE ONLY): 1332 SLP Time Calculation (min) (ACUTE ONLY): 32 min  Past Medical History:  Past Medical History:  Diagnosis Date  . Adjustment disorder with mixed emotional features   . Atrial flutter (HCC)   . CAD (coronary artery disease)   . Depression   . Diabetes mellitus type II, uncontrolled (HCC)   . Diabetic peripheral neuropathy (HCC)   . Erectile dysfunction   . GERD (gastroesophageal reflux disease)   . Herpes zoster   . Hyperlipidemia   . Hypertension   . Obesity   . Osteoarthritis   . Postherpetic neuralgia   . Sleep apnea    Past Surgical History:  Past Surgical History:  Procedure Laterality Date  . CARDIOVERSION N/A 11/10/2015   Procedure: CARDIOVERSION;  Surgeon: Laurey Morale, MD;  Location: [facility] ENDOSCOPY;  Service: Cardiovascular;  Laterality: N/A;  . CARDIOVERSION N/A 02/21/2016   Procedure: CARDIOVERSION;  Surgeon: Lewayne Bunting, MD;  Location: [facility] ENDOSCOPY;  Service: Cardiovascular;  Laterality: N/A;  . CORONARY ARTERY BYPASS GRAFT  1996   eith a LIMA to the LAD, saphenous vein graft to the acute marginal, saphenous vein graft to the PDA and saphenous vein graft to the circumflex.  [name] KNEE ARTHROSCOPY  05/2004   right knee  . REPLACEMENT TOTAL KNEE BILATERAL    . SHOULDER SURGERY     HPI:  This is 72 year old white male who presents with fever and shaking along with facial swelling. It appears the patient had the dental abscess associated with cellulitis. He has been worked up extensively. Appears up blood cultures have not been positive. He has had x-[place] showing the pneumonia presumably due to aspiration. Patient has been encephalopathic and agitated which resulted in the current consultation. He had a stroke about a year ago. The wife reports that he had  significant right-sided weakness with a stroke and did have some residual problems from this. She tells me that he did have a footdrop on the right related to his stroke although he has had some neuropathy which may have contributed. Neuropathy is a more longstanding issue than the recent stroke a year ago. He is noted to have a mild dysarthria which wife tells me fluctuates and is probably his baseline. The patient's agitation have been occurring during the day and also at nighttime but may have been worse at nighttime and worse with of his CPAP machine while he has been hospitalized. He appears to have improved significantly this morning however. She reports that he is pretty close to baseline. The patient himself has little recollection of the events while he is hospitalized in the for canal provide any history. However, the wife does not report any clear focal new symptoms such is worsening dysarthria or worsening weakness on the right side. No left-sided weakness is reported. No visual changes are reported. The review of systems otherwise negative. Multiple infarcts both acute and chronic with increased risk of vascular dementia. Wife reports that he often became "choked up" after eating or drinking. CT chest performed whichconfirmed evidence of aspiration pneumonia. Antibiotic coverage has been broadened to vancomycin and Zosyn. Speech therapy consulted for swallow evaluation.   Assessment / Plan / Recommendation Clinical Impression  Clinical swallow evaluation completed with Pt alert and upright in bed. The patient is alert, but confused and denies previous dysphagia. His  wife, who is not present, reported to MD that Pt with coughing post meals at home. Pt also with PNA during this admission. Oral motor examination is WNL. Pt self presented thin water via cup/straw, applesauce, and graham crackers. Pt with one episode of overt coughing after sequential straw sips of water, improved  performance noted with cup sips. Pt noted with frequent throat clearing and some coughing after po (over 10 minutes after). Will proceed with MBSS this date to objectively evaluate swallow due to risks above.   SLP Visit Diagnosis: Dysphagia, oral phase (R13.11)    Aspiration Risk  Mild aspiration risk    Diet Recommendation Regular;Thin liquid   Liquid Administration via: No straw Medication Administration: Whole meds with liquid Supervision: Patient able to self feed Postural Changes: Seated upright at 90 degrees;Remain upright for at least 30 minutes after po intake    Other  Recommendations Oral Care Recommendations: Oral care BID Other Recommendations: Clarify dietary restrictions   Follow up Recommendations Skilled Nursing facility      Frequency and Duration min 2x/week  1 week       Prognosis Prognosis for Safe Diet Advancement: Fair Barriers to Reach Goals: Cognitive deficits      Swallow Study   General Date of Onset: 05/29/17 HPI: This is 72 year old white male who presents with fever and shaking along with facial swelling. It appears the patient had the dental abscess associated with cellulitis. He has been worked up extensively. Appears up blood cultures have not been positive. He has had x-Capriotti showing the pneumonia presumably due to aspiration. Patient has been encephalopathic and agitated which resulted in the current consultation. He had a stroke about a year ago. The wife reports that he had significant right-sided weakness with a stroke and did have some residual problems from this. She tells me that he did have a footdrop on the right related to his stroke although he has had some neuropathy which may have contributed. Neuropathy is a more longstanding issue than the recent stroke a year ago. He is noted to have a mild dysarthria which wife tells me fluctuates and is probably his baseline. The patient's agitation have been occurring during the day and  also at nighttime but may have been worse at nighttime and worse with of his CPAP machine while he has been hospitalized. He appears to have improved significantly this morning however. She reports that he is pretty close to baseline. The patient himself has little recollection of the events while he is hospitalized in the for canal provide any history. However, the wife does not report any clear focal new symptoms such is worsening dysarthria or worsening weakness on the right side. No left-sided weakness is reported. No visual changes are reported. The review of systems otherwise negative. Multiple infarcts both acute and chronic with increased risk of vascular dementia. Wife reports that he often became "choked up" after eating or drinking. CT chest performed whichconfirmed evidence of aspiration pneumonia. Antibiotic coverage has been broadened to vancomycin and Zosyn. Speech therapy consulted for swallow evaluation. Type of Study: Bedside Swallow Evaluation Previous Swallow Assessment: None on record, seen by SLP for cog/ling 2018 Diet Prior to this Study: NPO Temperature Spikes Noted: No Respiratory Status: Nasal cannula History of Recent Intubation: No Behavior/Cognition: Alert;Cooperative Oral Cavity Assessment: Within Functional Limits Oral Care Completed by SLP: No Oral Cavity - Dentition: Adequate natural dentition;Missing dentition Vision: Functional for self-feeding Self-Feeding Abilities: Able to feed self Patient Positioning: Upright in bed Baseline Vocal  Quality: Normal Volitional Cough: Strong Volitional Swallow: Able to elicit    Oral/Motor/Sensory Function Overall Oral Motor/Sensory Function: Within functional limits   Ice Chips Ice chips: Within functional limits Presentation: Spoon   Thin Liquid Thin Liquid: Impaired Presentation: Cup;Self Fed;Spoon;Straw Pharyngeal  Phase Impairments: Throat Clearing - Delayed;Cough - Delayed Other Comments: Pt with delayed  coughing    Nectar Thick Nectar Thick Liquid: Not tested   Honey Thick Honey Thick Liquid: Not tested   Puree Puree: Within functional limits Presentation: Self Fed;Spoon   Solid   Thank you,  Havery Moros, CCC-SLP [phone]    Solid: Impaired Presentation: Self Fed Pharyngeal Phase Impairments: Throat Clearing - Delayed;Cough - Delayed        Franca Stakes 06/04/2017,1:34 PM

## 2017-06-04 NOTE — Progress Notes (Signed)
Progress note:  Brief summary:  72 year old male admitted to the hospital with fever, altered mental status and right sided dental infection.  Started on intravenous Unasyn.  Mental status has improved to baseline, but he still having fevers.  Influenza panel was noted to be negative.  Patient is having cough and shortness of breath.  CT chest has indicated evidence of pneumonia.  With his history of coughing after eating and drinking, suspect aspiration pneumonia.  Speech therapy has been consulted.  He will need placement in skilled nursing facility on discharge.   Subjective Pt is a lot more alert this morning.  He did not receive his nightly bipap last night.  He is hungry and wants to eat something.     Past Medical History:  Diagnosis Date  . Adjustment disorder with mixed emotional features   . Atrial flutter (HCC)   . CAD (coronary artery disease)   . Depression   . Diabetes mellitus type II, uncontrolled (HCC)   . Diabetic peripheral neuropathy (HCC)   . Erectile dysfunction   . GERD (gastroesophageal reflux disease)   . Herpes zoster   . Hyperlipidemia   . Hypertension   . Obesity   . Osteoarthritis   . Postherpetic neuralgia   . Sleep apnea     Past Surgical History:  Procedure Laterality Date  . CARDIOVERSION N/A 11/10/2015   Procedure: CARDIOVERSION;  Surgeon: Laurey Moralealton S McLean, MD;  [facility];  Service: Cardiovascular;  Laterality: N/A;  . CARDIOVERSION N/A 02/21/2016   Procedure: CARDIOVERSION;  Surgeon: Lewayne BuntingBrian S Crenshaw, MD;  [facility];  Service: Cardiovascular;  Laterality: N/A;  . CORONARY ARTERY BYPASS GRAFT  1996   eith a LIMA to the LAD, saphenous vein graft to the acute marginal, saphenous vein graft to the PDA and saphenous vein graft to the circumflex.  [name]. KNEE ARTHROSCOPY  05/2004   right knee  . REPLACEMENT TOTAL KNEE BILATERAL    . SHOULDER SURGERY       reports that  has never smoked. he has never used smokeless tobacco.  He reports that he drinks about 1.2 oz of alcohol per week. He reports that he does not use drugs.  No Known Allergies  Family History  Problem Relation Age of Onset  . Coronary artery disease Mother        deceased at 3475  . Coronary artery disease Father        died age 73  . Stroke Unknown        half brother  . Stroke Brother     Prior to Admission medications   Medication Sig Start Date End Date Taking? Authorizing Provider  acetaminophen (TYLENOL) 500 MG tablet Take 2 tablets (1,000 mg total) by mouth every 6 (six) hours as needed. Patient taking differently: Take 1,000 mg by mouth every 6 (six) hours as needed for mild pain.  05/24/16   Arby BarrettePfeiffer, Marcy, MD  apixaban (ELIQUIS) 5 MG TABS tablet Take 1 tablet (5 mg total) by mouth 2 (two) times daily. 08/09/16   Nafziger, Kandee Keenory, NP  atorvastatin (LIPITOR) 80 MG tablet Take 1 tablet (80 mg total) by mouth daily. 04/03/17   Nafziger, Kandee Keenory, NP  buPROPion (WELLBUTRIN SR) 150 MG 12 hr tablet Take 1 tablet (150 mg total) by mouth 2 (two) times daily. 04/02/17   Nafziger, Kandee Keenory, NP  doxycycline (VIBRA-TABS) 100 MG tablet Take 1 tablet (100 mg total) by mouth 2 (two) times daily. 05/01/17   Shirline FreesNafziger, [name], NP  furosemide (LASIX) 20 MG tablet Take 2 tablets (40 mg total) by mouth daily. 04/24/17 07/23/17  Lewayne Bunting, MD  glipiZIDE (GLUCOTROL) 10 MG tablet Take 10 mg by mouth daily. 07/14/16   [provider]  glucose blood (ACCU-CHEK ACTIVE STRIPS) test strip Use as instructed 03/29/16   Nafziger, Kandee Keen, NP  HYDROcodone-homatropine (HYCODAN) 5-1.5 MG/5ML syrup Take 5 mLs by mouth every 8 (eight) hours as needed for cough. 04/05/17   Nafziger, Kandee Keen, NP  Insulin Glargine (BASAGLAR KWIKPEN) 100 UNIT/ML SOPN Inject 0.25 mLs (25 Units total) into the skin at bedtime. Patient taking differently: Inject 20 Units into the skin at bedtime.  07/06/16   Angiulli, Mcarthur Rossetti, PA-C  Insulin Pen Needle (PEN NEEDLES 31GX5/16") 31G X 8 MM MISC Use to inject  insulin 5 times a day. 07/06/16   Angiulli, Mcarthur Rossetti, PA-C  isosorbide mononitrate (IMDUR) 30 MG 24 hr tablet Take 1 tablet (30 mg total) by mouth daily. 08/09/16   Nafziger, Kandee Keen, NP  Lancets (ACCU-CHEK MULTICLIX) lancets Use up to 5 times a day to check blood sugar. 07/06/16   Angiulli, Mcarthur Rossetti, PA-C  losartan (COZAAR) 50 MG tablet Take 1 tablet (50 mg total) by mouth daily. 05/02/17 04/27/18  Azalee Course, PA  metFORMIN (GLUCOPHAGE) 1000 MG tablet TAKE 1 TABLET (1,000 MG TOTAL) BY MOUTH 2 (TWO) TIMES DAILY WITH A MEAL. 12/12/16   Nafziger, Kandee Keen, NP  mirtazapine (REMERON) 15 MG tablet Take 15 mg by mouth at bedtime.    [provider]  Nebivolol HCl (BYSTOLIC) 20 MG TABS Take 1 tablet by mouth daily.    [provider]  omeprazole (PRILOSEC) 20 MG capsule TAKE ONE CAPSULE TWICE A DAY BEFORE A MEAL 05/21/17   Nafziger, Kandee Keen, NP  thiamine 100 MG tablet Take 1 tablet (100 mg total) by mouth daily. 08/09/16   Nafziger, Kandee Keen, NP  vitamin B-12 (CYANOCOBALAMIN) 1000 MCG tablet Take 2 tablets (2,000 mcg total) by mouth daily. 08/09/16   Shirline Frees, NP    Physical Exam:  Vitals:   06/04/17 0800 06/04/17 0900 06/04/17 0921 06/04/17 1000  BP: 140/77 (!) 155/91  124/77  Pulse: 77 80  74  Resp: 20 (!) 23  19  Temp: 98.3 F (36.8 C)     TempSrc: Oral     SpO2: 97% 100% 100% 94%  Weight:      Height:       Exam  General exam: Not in any distress. Resting but arousable.  Respiratory system: improved expiratory wheeze compared to yesterday. Respiratory effort normal. Cardiovascular system: normal s1, s2 sounds. No murmurs, rubs, gallops. Gastrointestinal system: Abdomen is nondistended, soft and nontender. No organomegaly or masses felt. Normal bowel sounds heard. Central nervous system: No gross motor deficits. Extremities: No C/C/E, +pedal pulses Skin: No rashes, lesions or ulcers Psychiatry: Normal mood, does not appear anxious.   Labs on Admission: I have personally reviewed  following labs and imaging studies  CBC: Recent Labs  Lab 05/29/17 0538 05/30/17 0401 05/31/17 0738 06/02/17 0331 06/03/17 0730 06/04/17 0343  WBC 11.1* 9.8 7.7 8.5 8.7 9.2  NEUTROABS 8.6*  --   --   --   --   --   HGB 10.1* 9.3* 9.1* 9.3* 8.9* 9.1*  HCT 33.8* 31.3* 30.5* 31.0* 29.7* 29.7*  MCV 100.9* 101.3* 102.0* 101.6* 101.7* 101.0*  PLT 199 161 135* 173 156 152   Basic Metabolic Panel: Recent Labs  Lab 05/30/17 0401 05/31/17 5284 06/02/17 0331 06/03/17 0513 06/04/17 0343  NA 141 143 144 142 143  K 4.0 3.7 3.4* 3.5 3.7  CL 107 106 105 102 106  CO2 23 29 25 30 27   GLUCOSE 113* 132* 126* 143* 115*  BUN 31* 32* 26* 23* 25*  CREATININE 1.28* 1.23 1.10 1.17 0.92  CALCIUM 9.0 8.8* 9.0 8.6* 8.7*  MG  --   --   --  1.9  --    GFR: Estimated Creatinine Clearance: 86.7 mL/min (by C-G formula based on SCr of 0.92 mg/dL). Liver Function Tests: Recent Labs  Lab 05/29/17 0538  AST 24  ALT 20  ALKPHOS 126  BILITOT 1.7*  PROT 6.6  ALBUMIN 3.6   No results for input(s): LIPASE, AMYLASE in the last 168 hours. No results for input(s): AMMONIA in the last 168 hours. Coagulation Profile: No results for input(s): INR, PROTIME in the last 168 hours. Cardiac Enzymes: No results for input(s): CKTOTAL, CKMB, CKMBINDEX, TROPONINI in the last 168 hours. BNP (last 3 results) No results for input(s): PROBNP in the last 8760 hours. HbA1C: Recent Labs    06/03/17 0513  HGBA1C 5.2   CBG: Recent Labs  Lab 06/03/17 1222 06/03/17 1653 06/03/17 2110 06/04/17 0741 06/04/17 1151  GLUCAP 88 100* 122* 101* 113*   Lipid Profile: Recent Labs    06/03/17 0513  CHOL 96  HDL 24*  LDLCALC 58  TRIG 72  CHOLHDL 4.0   Thyroid Function Tests: No results for input(s): TSH, T4TOTAL, FREET4, T3FREE, THYROIDAB in the last 72 hours. Anemia Panel: No results for input(s): VITAMINB12, FOLATE, FERRITIN, TIBC, IRON, RETICCTPCT in the last 72 hours. Urine analysis:    Component Value  Date/Time   COLORURINE AMBER (A) 05/29/2017 0523   APPEARANCEUR HAZY (A) 05/29/2017 0523   LABSPEC 1.020 05/29/2017 0523   PHURINE 5.0 05/29/2017 0523   GLUCOSEU NEGATIVE 05/29/2017 0523   GLUCOSEU NEGATIVE 05/27/2007 0844   HGBUR NEGATIVE 05/29/2017 0523   HGBUR negative 05/05/2010 1041   BILIRUBINUR NEGATIVE 05/29/2017 0523   KETONESUR NEGATIVE 05/29/2017 0523   PROTEINUR 30 (A) 05/29/2017 0523   UROBILINOGEN 0.2 05/05/2010 1041   NITRITE NEGATIVE 05/29/2017 0523   LEUKOCYTESUR NEGATIVE 05/29/2017 0523    Radiological Exams on Admission: Ct Head Wo Contrast  Result Date: 06/02/2017 CLINICAL DATA:  72 year old male with unexplained altered mental status. EXAM: CT HEAD WITHOUT CONTRAST TECHNIQUE: Contiguous axial images were obtained from the base of the skull through the vertex without intravenous contrast. COMPARISON:  Brain MRI and head CT without contrast 06/28/2016 FINDINGS: Brain: Stable cerebral volume. No midline shift, ventriculomegaly, mass effect, evidence of mass lesion, intracranial hemorrhage or evidence of cortically based acute infarction. Patchy and confluent left greater than right cerebral white matter hypodensity appears stable. Chronic hypodensity in the left thalamus is stable. Hypodensity in the posterior limb of the right external capsule is new since 2018 on series 2, image 19 and sagittal image 22. No associated mass effect. Stable gray-white matter differentiation elsewhere. Vascular: Calcified atherosclerosis at the skull base. Skull: Stable.  No acute osseous abnormality identified. Sinuses/Orbits: Visualized paranasal sinuses and mastoids are stable and well pneumatized. Other: No acute orbit or scalp soft tissue findings. IMPRESSION: 1. Age indeterminate small vessel ischemia in the posterior right external capsule is new since April 2018. No associated hemorrhage or mass effect. 2. Elsewhere the noncontrast CT appearance of the brain is stable since 2018.  Electronically Signed   By: Odessa Fleming M.D.   On: 06/02/2017 16:32   Mr Luis Yu  Contrast  Result Date: 06/03/2017 CLINICAL DATA:  72 year old male with altered mental status and confusion for 2 days. Age indeterminate small vessel ischemia in the right external capsule on head CT yesterday. EXAM: MRI HEAD WITHOUT CONTRAST MRA HEAD WITHOUT CONTRAST TECHNIQUE: Multiplanar, multiecho pulse sequences of the brain and surrounding structures were obtained without intravenous contrast. Angiographic images of the head were obtained using MRA technique without contrast. COMPARISON:  Head CT without contrast 06/02/2017. Brain MRI and intracranial MRA 06/28/2016. FINDINGS: MRI HEAD FINDINGS The examination had to be discontinued prior to completion due to patient confusion. Axial and coronal diffusion weighted imaging, sagittal T1 and axial T2 weighted imaging only was obtained. Brain: The posterior limb right internal capsule area of hypodensity seen by CT does correspond to an oval 11 millimeter area of restricted diffusion (series 3, image 87). There is associated T2 hyperintensity, with no mass effect or evidence of associated hemorrhage. There is also a tiny additional subcortical white matter focus of restricted diffusion in the superolateral right occipital lobe bordering the posterior temporal lobe on series 3, image 85. No contralateral or posterior fossa restricted diffusion. Stable cerebral volume. Chronic lacunar infarcts in the left superior cerebellum, bilateral thalami, left corona radiata. No no midline shift, mass effect, evidence of mass lesion, ventriculomegaly, extra-axial collection or acute intracranial hemorrhage. Cervicomedullary junction and pituitary are within normal limits. Vascular: Major intracranial vascular flow voids are stable since 2018. Skull and upper cervical spine: Stable. Sinuses/Orbits: Stable. Other: Stable trace left mastoid fluid. Small volume retained secretions in the  nasopharynx. MRA HEAD FINDINGS Stable antegrade flow in the posterior circulation. Chronic irregularity and mild stenosis in the right vertebral artery V4 segment. Both PICA origins remain patent. Negative distal left vertebral artery. Patent vertebrobasilar junction and basilar artery without stenosis. SCA and PCA origins remain normal. PCA branches are stable and within normal limits. Both posterior communicating arteries are present. Stable antegrade flow in both ICA siphons. Mild siphon irregularity with no stenosis. Ophthalmic and posterior communicating artery origins remain normal. Patent carotid termini. MCA and ACA origins remain normal. The left A1 segment is mildly dominant as before. The posterior communicating scratched at the anterior communicating artery is fenestrated, normal variant. Visible ACA branches are stable and within normal limits. The left MCA M1 segment and bifurcation remain patent. There is mild irregularity and stenosis at the distal M1 and bifurcation, more affecting the anterior left M2 origin. Stable visible left MCA branches. The right MCA M1 segment and trifurcation remain patent. There is mild irregularity and stenosis at the distal right M1 which is stable (series 103, image 11). The visible right MCA branches are stable. IMPRESSION: 1. Two small acute lacunar infarcts in the right hemisphere, the larger at the right external capsule corresponding to the indeterminate CT finding yesterday. No associated hemorrhage or mass effect. 2. No intracranial artery occlusion identified. Mild intracranial atherosclerosis and stenosis in both the anterior and posterior circulation appears stable since 06/28/2016. 3. The MRI portion of the exam had to be discontinued prior to completion due to patient confusion. Electronically Signed   By: Odessa Fleming M.D.   On: 06/03/2017 10:33   US Carotid Bilateral  Result Date: 06/04/2017 CLINICAL DATA:  72 year old male with acute small right-sided  lacunar infarcts. EXAM: BILATERAL CAROTID DUPLEX ULTRASOUND TECHNIQUE: Dimmer scale imaging, color Doppler and duplex ultrasound were performed of bilateral carotid and vertebral arteries in the neck. COMPARISON:  None. FINDINGS: Criteria: Quantification of carotid stenosis is based on velocity parameters  that correlate the residual internal carotid diameter with NASCET-based stenosis levels, using the diameter of the distal internal carotid lumen as the denominator for stenosis measurement. The following velocity measurements were obtained: RIGHT ICA:  111/28 cm/sec CCA:  84/14 cm/sec SYSTOLIC ICA/CCA RATIO:  1.3 DIASTOLIC ICA/CCA RATIO:  2.0 ECA:  89 cm/sec LEFT ICA:  138/36 cm/sec CCA:  78/15 cm/sec SYSTOLIC ICA/CCA RATIO:  1.8 DIASTOLIC ICA/CCA RATIO:  2.4 ECA:  107 cm/sec RIGHT CAROTID ARTERY: Heterogeneous atherosclerotic plaque throughout the common carotid artery extending into the internal carotid artery. By peak systolic velocity criteria, the estimated stenosis remains less than 50%. RIGHT VERTEBRAL ARTERY:  Patent with antegrade flow. LEFT CAROTID ARTERY: Heterogeneous atherosclerotic plaque in the proximal internal carotid artery. By peak systolic velocity criteria, the estimated stenosis falls in the 50-69% diameter range. LEFT VERTEBRAL ARTERY:  Patent with normal antegrade flow. IMPRESSION: 1. Mild (1-49%) stenosis proximal right internal carotid artery secondary to heterogenous atherosclerotic plaque. 2. Moderate (50-69%) stenosis proximal left internal carotid artery secondary to heterogenous atherosclerotic plaque. 3. Vertebral arteries are patent with antegrade flow. Signed, Sterling Big, MD [facility] Electronically Signed   By: Malachy Moan M.D.   On: 06/04/2017 08:01   Mr Brain Limited Wo Contrast  Result Date: 06/03/2017 CLINICAL DATA:  72 year old male with altered mental status and confusion for 2 days. Age  indeterminate small vessel ischemia in the right external capsule on head CT yesterday. EXAM: MRI HEAD WITHOUT CONTRAST MRA HEAD WITHOUT CONTRAST TECHNIQUE: Multiplanar, multiecho pulse sequences of the brain and surrounding structures were obtained without intravenous contrast. Angiographic images of the head were obtained using MRA technique without contrast. COMPARISON:  Head CT without contrast 06/02/2017. Brain MRI and intracranial MRA 06/28/2016. FINDINGS: MRI HEAD FINDINGS The examination had to be discontinued prior to completion due to patient confusion. Axial and coronal diffusion weighted imaging, sagittal T1 and axial T2 weighted imaging only was obtained. Brain: The posterior limb right internal capsule area of hypodensity seen by CT does correspond to an oval 11 millimeter area of restricted diffusion (series 3, image 87). There is associated T2 hyperintensity, with no mass effect or evidence of associated hemorrhage. There is also a tiny additional subcortical white matter focus of restricted diffusion in the superolateral right occipital lobe bordering the posterior temporal lobe on series 3, image 85. No contralateral or posterior fossa restricted diffusion. Stable cerebral volume. Chronic lacunar infarcts in the left superior cerebellum, bilateral thalami, left corona radiata. No no midline shift, mass effect, evidence of mass lesion, ventriculomegaly, extra-axial collection or acute intracranial hemorrhage. Cervicomedullary junction and pituitary are within normal limits. Vascular: Major intracranial vascular flow voids are stable since 2018. Skull and upper cervical spine: Stable. Sinuses/Orbits: Stable. Other: Stable trace left mastoid fluid. Small volume retained secretions in the nasopharynx. MRA HEAD FINDINGS Stable antegrade flow in the posterior circulation. Chronic irregularity and mild stenosis in the right vertebral artery V4 segment. Both PICA origins remain patent. Negative distal left  vertebral artery. Patent vertebrobasilar junction and basilar artery without stenosis. SCA and PCA origins remain normal. PCA branches are stable and within normal limits. Both posterior communicating arteries are present. Stable antegrade flow in both ICA siphons. Mild siphon irregularity with no stenosis. Ophthalmic and posterior communicating artery origins remain normal. Patent carotid termini. MCA and ACA origins remain normal. The left A1 segment is mildly dominant as before. The posterior communicating scratched at the anterior communicating artery is fenestrated, normal variant. Visible  ACA branches are stable and within normal limits. The left MCA M1 segment and bifurcation remain patent. There is mild irregularity and stenosis at the distal M1 and bifurcation, more affecting the anterior left M2 origin. Stable visible left MCA branches. The right MCA M1 segment and trifurcation remain patent. There is mild irregularity and stenosis at the distal right M1 which is stable (series 103, image 11). The visible right MCA branches are stable. IMPRESSION: 1. Two small acute lacunar infarcts in the right hemisphere, the larger at the right external capsule corresponding to the indeterminate CT finding yesterday. No associated hemorrhage or mass effect. 2. No intracranial artery occlusion identified. Mild intracranial atherosclerosis and stenosis in both the anterior and posterior circulation appears stable since 06/28/2016. 3. The MRI portion of the exam had to be discontinued prior to completion due to patient confusion. Electronically Signed   By: Odessa Fleming M.D.   On: 06/03/2017 10:33    EKG: Independently reviewed.  Atrial fibrillation with a left bundle branch block, no significant change from prior tracings  Assessment/Plan  1. Sepsis - secondary to acute right-sided dental infection.  Clinically improving.  Patient noted to have swelling of the right mandible area.  CT confirms dental infection without  any drainable abscess. Continue IV unasyn with improvement of right face swelling.  2. Aspiration pneumonia. IV unasyn ordered.   Speech therapy consulted for swallow evaluation. 3. Paroxysmal atrial fibrillation.  Chads vas 2 score of at least 3-  continue on rate control with nebivolol and anticoagulation with Eliquis. 4. Acute CVA - I have ordered stroke work up and neurology consultation  See neuro notes, will continue full anticoagulation.   5. Chronic diastolic congestive heart failure.  EF on recent echo 55%.  Appears stable at this time.  Holding Lasix as he is not drinking well.  Weight appears to be near baseline.  Continue Imdur 6. GERD.  Continue on PPI 7. Hyperlipidemia.  Continue on statin. 8. DM type II.  Lantus and supplemental sliding scale.  Blood sugars are currently stable.  Continue to monitor. 9. Hypertension.  Continue on Bystolic, losartan 10. Acute Encephalopathy - likely related to acute CVA. Neurology consult requested.    11. Urinary retention.  Patient required Foley catheter placement.  Flomax has been added.  Will need to attempt voiding trial prior to discharge.  DVT prophylaxis: Eliquis Code Status: DNR Family Communication: wife present and updated at length Disposition Plan: when medically stabilized, planning for rehab placement Consults called: None Admission status: Inpatient, MedSurg  Standley Dakins M.D on 06/04/2017 at 2:34 PM  Between 7am to 7pm - Pager - [phone]

## 2017-06-04 NOTE — Progress Notes (Signed)
Upon receiving report from day shift RN, patient began to climb out of bed and pull at foley. Patient was becoming verbally aggressive but able to be calmed by wife. Patient oriented to self and place  Prn Haldol given at 1929. Pt continued to be aggressive and combative. Pushed wife, kicking and swinging at staff, and continuing to get out of bed. MD paged  Additional dose of Haldol given and order for restraints received. Pt had to be placed in 4 point restraints. BIPAP placed.  Patient then calmed down and was following commands. At 2215 ankle restraints removed. At 2245 wrist restraints removed. Pt agreed to take some of his oral medications but would not take all of them. BIPAP removed for a short time. Placed patient back on BIPAP at 2300. Pt verbally agreed to not touch equipment. Patient oriented to self and place  Pt removed BIPAP at 2315 and refused to wear it. He then refused to wear his nasal cannula. Pt became verbally agressive again and began to remove monitoring cables. Wrist restraints reapplied. RN discussed with patient the reason why the restraints had to be reapplied. Pt acknowledges. Pt will not allow RN to apply BIPAP or nasal cannula. Will try again later. Wife updated.

## 2017-06-05 LAB — GLUCOSE, CAPILLARY
Glucose-Capillary: 129 mg/dL — ABNORMAL HIGH (ref 65–99)
Glucose-Capillary: 124 mg/dL — ABNORMAL HIGH (ref 65–99)
Glucose-Capillary: 133 mg/dL — ABNORMAL HIGH (ref 65–99)
Glucose-Capillary: 104 mg/dL — ABNORMAL HIGH (ref 65–99)

## 2017-06-05 LAB — CREATININE, SERUM
Creatinine, Ser: 0.91 mg/dL (ref 0.61–1.24)
GFR calc Af Amer: >60 mL/min (ref >60)
GFR calc non Af Amer: >60 mL/min (ref >60)

## 2017-06-05 MED ORDER — IPRATROPIUM-ALBUTEROL 0.5-2.5 (3) MG/3ML IN SOLN
3.0000 mL | Freq: Two times a day (BID) | RESPIRATORY_TRACT | Status: DC
  Administered 2017-06-05 – 2017-06-12 (×14): 3 mL via RESPIRATORY_TRACT
  Filled 2017-06-05 (×12): qty 3

## 2017-06-05 MED ORDER — DIPHENHYDRAMINE HCL 50 MG/ML IJ SOLN
25.0000 mg | Freq: Once | INTRAMUSCULAR | Status: AC
  Administered 2017-06-05: 25 mg via INTRAVENOUS
  Filled 2017-06-05: qty 1

## 2017-06-05 MED ORDER — DONEPEZIL HCL 5 MG PO TABS
5.0000 mg | ORAL_TABLET | Freq: Every day | ORAL | Status: DC
  Administered 2017-06-05: 5 mg via ORAL
  Filled 2017-06-05: qty 1

## 2017-06-05 MED ORDER — BISACODYL 10 MG RE SUPP
10.0000 mg | Freq: Once | RECTAL | Status: AC
  Administered 2017-06-05: 10 mg via RECTAL
  Filled 2017-06-05: qty 1

## 2017-06-05 MED ORDER — ENSURE ENLIVE PO LIQD
237.0000 mL | Freq: Two times a day (BID) | ORAL | Status: DC
  Administered 2017-06-06 (×2): 237 mL via ORAL

## 2017-06-05 MED ORDER — LORAZEPAM 2 MG/ML IJ SOLN
1.0000 mg | Freq: Once | INTRAMUSCULAR | Status: AC
  Administered 2017-06-05: 1 mg via INTRAVENOUS
  Filled 2017-06-05: qty 1

## 2017-06-05 MED ORDER — QUETIAPINE FUMARATE 100 MG PO TABS
100.0000 mg | ORAL_TABLET | Freq: Every day | ORAL | Status: DC
  Administered 2017-06-05 – 2017-06-08 (×4): 100 mg via ORAL
  Filled 2017-06-05 (×7): qty 1

## 2017-06-05 MED ORDER — SODIUM CHLORIDE 0.9 % IV SOLN
INTRAVENOUS | Status: AC
  Administered 2017-06-05: 09:00:00 via INTRAVENOUS

## 2017-06-05 MED ORDER — SENNOSIDES-DOCUSATE SODIUM 8.6-50 MG PO TABS
2.0000 | ORAL_TABLET | Freq: Two times a day (BID) | ORAL | Status: DC
  Administered 2017-06-05 – 2017-06-13 (×16): 2 via ORAL
  Filled 2017-06-05 (×17): qty 2

## 2017-06-05 NOTE — Progress Notes (Signed)
RN informed me that pt refused to wear BIPAP and [place]... I talked to him to wear either one of the devices but pt very agitated, cussing and refused to..[name]

## 2017-06-05 NOTE — Progress Notes (Signed)
Foam padding applied to all restraint contact areas. Patient is conversational at this point but is still confused.  Patient was able to take medications with no obvious or stated distress. Patient is on 2LPM O2 via  and is agreeable with such at this time.  Restraints released/reapplied/and adjusted at this time.

## 2017-06-05 NOTE — Progress Notes (Signed)
Hettinger A. Merlene Laughter, MD     [email or web address]          Luis Yu is an 72 y.o. male.   ASSESSMENT/PLAN: Resolving encephalopathy:  This is most likely multifactorial due to acute medical illness, the dental abscess with cellulitis and pneumonia.    Sundowning syndrome: The patient's Seroquel will be increased to 100 mg.  Embolic stroke in the setting of atrial fibrillation and acute illness with sepsis.  Agree with anticoagulation.  Multiple infarcts both acute and chronic with increased risk of vascular dementia: Aricept will be added at 5 mg. This can be titrated to 10 mg a months from now.   The patient has been quite agitated and restless overnight. He had to be given multiple doses of sedatives and neuroleptics. He has come down this morning.     GENERAL:  He patient is receiving a nebulized treatment and does corporate with this although he is somewhat restless and trying to move around a bit.  HEENT:  He does appears to have mild dysarthria and mild excessive salivation.  He appears to be significantly hearing impaired.  ABDOMEN: Soft  EXTREMITIES: No edema   BACK: Normal alignment.  SKIN: Normal by inspection.    MENTAL STATUS:  He lays in bed with eyes closed but opens his eyes to verbal commands. She does knows that he is in the hospital.  Again there is mild dysarthria.  He does follow commands with prompting.  He has decent but reduced spontaneous speech.  CRANIAL NERVES: Pupils are equal, round and reactive to light and accommodation; extraocular movements are full, there is no significant nystagmus; upper and lower facial muscles are normal in strength and symmetric, there is no flattening of the nasolabial folds; tongue is midline; uvula is midline; shoulder elevation is normal.  Visual fields are intact.  MOTOR:  Right dorsiflexion is 4/5.  Right hip flexion 5. Right upper extremity 5.  Left side shows normal tone, bulk and  strength.  COORDINATION: Left finger to nose is normal, right finger to nose is normal, No rest tremor; no intention tremor; no postural tremor; no bradykinesia.   SENSATION: Normal to light touch and temperature.       Blood pressure (!) 152/78, pulse 100, temperature 98.4 F (36.9 C), temperature source Oral, resp. rate 17, height '6\' 3"'  (1.905 m), weight 199 lb 1.2 oz (90.3 kg), SpO2 96 %.  Past Medical History:  Diagnosis Date  . Adjustment disorder with mixed emotional features   . Atrial flutter ([place])   . CAD (coronary artery disease)   . Depression   . Diabetes mellitus type II, uncontrolled ([place])   . Diabetic peripheral neuropathy ([place])   . Erectile dysfunction   . GERD (gastroesophageal reflux disease)   . Herpes zoster   . Hyperlipidemia   . Hypertension   . Obesity   . Osteoarthritis   . Postherpetic neuralgia   . Sleep apnea     Past Surgical History:  Procedure Laterality Date  . CARDIOVERSION N/A 11/10/2015   Procedure: CARDIOVERSION;  Surgeon: Larey Dresser, MD;  Location: [facility] ENDOSCOPY;  Service: Cardiovascular;  Laterality: N/A;  . CARDIOVERSION N/A 02/21/2016   Procedure: CARDIOVERSION;  Surgeon: Lelon Perla, MD;  Location: [facility] ENDOSCOPY;  Service: Cardiovascular;  Laterality: N/A;  . CORONARY ARTERY BYPASS GRAFT  1996   eith a LIMA to the LAD, saphenous vein graft to the acute marginal, saphenous vein graft to the PDA and saphenous vein graft  to the circumflex.  [name] KNEE ARTHROSCOPY  05/2004   right knee  . REPLACEMENT TOTAL KNEE BILATERAL    . SHOULDER SURGERY      Family History  Problem Relation Age of Onset  . Coronary artery disease Mother        deceased at 74  . Coronary artery disease Father        died age 40  . Stroke Unknown        half brother  . Stroke Brother     Social History:  reports that  has never smoked. he has never used smokeless tobacco. He reports that he drinks about 1.2 oz of alcohol per week. He reports that he  does not use drugs.  Allergies: No Known Allergies  Medications: Prior to Admission medications   Medication Sig Start Date End Date Taking? Authorizing Provider  acetaminophen (TYLENOL) 500 MG tablet Take 2 tablets (1,000 mg total) by mouth every 6 (six) hours as needed. Patient taking differently: Take 1,000 mg by mouth every 6 (six) hours as needed for mild pain.  05/24/16  Yes Charlesetta Shanks, MD  apixaban (ELIQUIS) 5 MG TABS tablet Take 1 tablet (5 mg total) by mouth 2 (two) times daily. 08/09/16  Yes Nafziger, Tommi Rumps, NP  atorvastatin (LIPITOR) 80 MG tablet Take 1 tablet (80 mg total) by mouth daily. 04/03/17  Yes Nafziger, Tommi Rumps, NP  buPROPion (WELLBUTRIN SR) 150 MG 12 hr tablet Take 1 tablet (150 mg total) by mouth 2 (two) times daily. 04/02/17  Yes Nafziger, Tommi Rumps, NP  ferrous sulfate 325 (65 FE) MG tablet Take 325 mg by mouth every other day.   Yes [provider]  furosemide (LASIX) 20 MG tablet Take 2 tablets (40 mg total) by mouth daily. 04/24/17 07/23/17 Yes Lelon Perla, MD  glipiZIDE (GLUCOTROL) 10 MG tablet Take 10 mg by mouth daily. 07/14/16  Yes [provider]  Hypromellose (ARTIFICIAL TEARS OP) Apply 1 drop to eye every 2 (two) hours as needed ([place]).   Yes [provider]  insulin aspart (NOVOLOG) 100 UNIT/ML injection Inject 5-15 Units into the skin 3 (three) times daily before meals. Per sliding scale   Yes [provider]  Insulin Glargine (BASAGLAR KWIKPEN) 100 UNIT/ML SOPN Inject 0.25 mLs (25 Units total) into the skin at bedtime. Patient taking differently: Inject 20 Units into the skin at bedtime.  07/06/16  Yes Angiulli, Lavon Paganini, PA-C  isosorbide mononitrate (IMDUR) 30 MG 24 hr tablet Take 1 tablet (30 mg total) by mouth daily. 08/09/16  Yes Nafziger, Tommi Rumps, NP  losartan (COZAAR) 50 MG tablet Take 1 tablet (50 mg total) by mouth daily. 05/02/17 04/27/18 Yes Meng, Isaac Laud, PA  metFORMIN (GLUCOPHAGE) 1000 MG tablet TAKE 1 TABLET (1,000 MG TOTAL)  BY MOUTH 2 (TWO) TIMES DAILY WITH A MEAL. 12/12/16  Yes Nafziger, Tommi Rumps, NP  mirtazapine (REMERON) 15 MG tablet Take 15 mg by mouth at bedtime.   Yes [provider]  Nebivolol HCl (BYSTOLIC) 20 MG TABS Take 1 tablet by mouth daily.   Yes [provider]  omeprazole (PRILOSEC) 20 MG capsule TAKE ONE CAPSULE TWICE A DAY BEFORE A MEAL 05/21/17  Yes Nafziger, Tommi Rumps, NP  penicillin v potassium (VEETID) 500 MG tablet Take 1,000 mg by mouth daily as needed (Swelling).    Yes [provider]  thiamine 100 MG tablet Take 1 tablet (100 mg total) by mouth daily. 08/09/16  Yes Nafziger, Tommi Rumps, NP  vitamin B-12 (CYANOCOBALAMIN) 1000 MCG tablet  Take 2 tablets (2,000 mcg total) by mouth daily. 08/09/16  Yes Nafziger, Tommi Rumps, NP    Scheduled Meds: . atorvastatin  80 mg Oral QHS  . bisacodyl  10 mg Rectal Once  . buPROPion  150 mg Oral BID  . enoxaparin (LOVENOX) injection  90 mg Subcutaneous Q12H  . insulin aspart  0-15 Units Subcutaneous TID WC  . insulin glargine  10 Units Subcutaneous QHS  . ipratropium-albuterol  3 mL Nebulization BID  . isosorbide mononitrate  30 mg Oral Daily  . losartan  50 mg Oral Daily  . mirtazapine  15 mg Oral QHS  . nebivolol  20 mg Oral QHS  . pantoprazole  40 mg Oral BID AC  . QUEtiapine  25 mg Oral QHS  . senna-docusate  2 tablet Oral BID  . tamsulosin  0.4 mg Oral Daily  . thiamine  100 mg Oral Daily  . vitamin B-12  2,000 mcg Oral QHS   Continuous Infusions: . sodium chloride 50 mL/hr at 06/05/17 0847  . ampicillin-sulbactam (UNASYN) IV Stopped (06/05/17 0505)   PRN Meds:.acetaminophen **OR** acetaminophen, albuterol, food thickener, haloperidol lactate, [DISCONTINUED] ondansetron **OR** ondansetron (ZOFRAN) IV     Results for orders placed or performed during the hospital encounter of 05/29/17 (from the past 48 hour(s))  Glucose, capillary     Status: None   Collection Time: 06/03/17 12:22 PM  Result Value Ref Range   Glucose-Capillary 88  65 - 99 mg/dL  Glucose, capillary     Status: Abnormal   Collection Time: 06/03/17  4:53 PM  Result Value Ref Range   Glucose-Capillary 100 (H) 65 - 99 mg/dL  Glucose, capillary     Status: Abnormal   Collection Time: 06/03/17  9:10 PM  Result Value Ref Range   Glucose-Capillary 122 (H) 65 - 99 mg/dL  CBC     Status: Abnormal   Collection Time: 06/04/17  3:43 AM  Result Value Ref Range   WBC 9.2 4.0 - 10.5 K/uL   RBC 2.94 (L) 4.22 - 5.81 MIL/uL   Hemoglobin 9.1 (L) 13.0 - 17.0 g/dL   HCT 29.7 (L) 39.0 - 52.0 %   MCV 101.0 (H) 78.0 - 100.0 fL   MCH 31.0 26.0 - 34.0 pg   MCHC 30.6 30.0 - 36.0 g/dL   RDW 17.0 (H) 11.5 - 15.5 %   Platelets 152 150 - 400 K/uL    Comment: Performed at [facility], [address], [place], [place] [address]  Basic metabolic panel     Status: Abnormal   Collection Time: 06/04/17  3:43 AM  Result Value Ref Range   Sodium 143 135 - 145 mmol/L   Potassium 3.7 3.5 - 5.1 mmol/L   Chloride 106 101 - 111 mmol/L   CO2 27 22 - 32 mmol/L   Glucose, Bld 115 (H) 65 - 99 mg/dL   BUN 25 (H) 6 - 20 mg/dL   Creatinine, Ser 0.92 0.61 - 1.24 mg/dL   Calcium 8.7 (L) 8.9 - 10.3 mg/dL   GFR calc non Af Amer >60 >60 mL/min   GFR calc Af Amer >60 >60 mL/min    Comment: (NOTE) The eGFR has been calculated using the CKD EPI equation. This calculation has not been validated in all clinical situations. eGFR's persistently <60 mL/min signify possible Chronic Kidney Disease.    Anion gap 10 5 - 15    Comment: Performed at [facility], [address] [place], [place] [address]  Glucose, capillary  Status: Abnormal   Collection Time: 06/04/17  7:41 AM  Result Value Ref Range   Glucose-Capillary 101 (H) 65 - 99 mg/dL  Glucose, capillary     Status: Abnormal   Collection Time: 06/04/17 11:51 AM  Result Value Ref Range   Glucose-Capillary 113 (H) 65 - 99 mg/dL  Glucose, capillary     Status: Abnormal   Collection Time: 06/04/17  4:47 PM  Result Value Ref Range    Glucose-Capillary 187 (H) 65 - 99 mg/dL  Glucose, capillary     Status: Abnormal   Collection Time: 06/04/17  9:18 PM  Result Value Ref Range   Glucose-Capillary 197 (H) 65 - 99 mg/dL  Creatinine, serum     Status: None   Collection Time: 06/05/17  4:08 AM  Result Value Ref Range   Creatinine, Ser 0.91 0.61 - 1.24 mg/dL   GFR calc non Af Amer >60 >60 mL/min   GFR calc Af Amer >60 >60 mL/min    Comment: (NOTE) The eGFR has been calculated using the CKD EPI equation. This calculation has not been validated in all clinical situations. eGFR's persistently <60 mL/min signify possible Chronic Kidney Disease. Performed at [facility], [address] [place], [place] [address]   Glucose, capillary     Status: Abnormal   Collection Time: 06/05/17  7:22 AM  Result Value Ref Range   Glucose-Capillary 129 (H) 65 - 99 mg/dL    Studies/Results:  BRAIN MRI MRA CLINICAL DATA:  72 year old male with altered mental status and confusion for 2 days. Age indeterminate small vessel ischemia in the right external capsule on head CT yesterday.  EXAM: MRI HEAD WITHOUT CONTRAST  MRA HEAD WITHOUT CONTRAST  TECHNIQUE: Multiplanar, multiecho pulse sequences of the brain and surrounding structures were obtained without intravenous contrast. Angiographic images of the head were obtained using MRA technique without contrast.  COMPARISON:  Head CT without contrast 06/02/2017. Brain MRI and intracranial MRA 06/28/2016.  FINDINGS: MRI HEAD FINDINGS  The examination had to be discontinued prior to completion due to patient confusion. Axial and coronal diffusion weighted imaging, sagittal T1 and axial T2 weighted imaging only was obtained.  Brain: The posterior limb right internal capsule area of hypodensity seen by CT does correspond to an oval 11 millimeter area of restricted diffusion (series 3, image 87). There is associated T2 hyperintensity, with no mass effect or evidence of  associated hemorrhage.  There is also a tiny additional subcortical white matter focus of restricted diffusion in the superolateral right occipital lobe bordering the posterior temporal lobe on series 3, image 85. No contralateral or posterior fossa restricted diffusion.  Stable cerebral volume. Chronic lacunar infarcts in the left superior cerebellum, bilateral thalami, left corona radiata. No no midline shift, mass effect, evidence of mass lesion, ventriculomegaly, extra-axial collection or acute intracranial hemorrhage. Cervicomedullary junction and pituitary are within normal limits.  Vascular: Major intracranial vascular flow voids are stable since 2018.  Skull and upper cervical spine: Stable.  Sinuses/Orbits: Stable.  Other: Stable trace left mastoid fluid. Small volume retained secretions in the nasopharynx.  MRA HEAD FINDINGS  Stable antegrade flow in the posterior circulation. Chronic irregularity and mild stenosis in the right vertebral artery V4 segment. Both PICA origins remain patent. Negative distal left vertebral artery. Patent vertebrobasilar junction and basilar artery without stenosis. SCA and PCA origins remain normal. PCA branches are stable and within normal limits. Both posterior communicating arteries are present.  Stable antegrade flow in both ICA siphons. Mild siphon irregularity  with no stenosis. Ophthalmic and posterior communicating artery origins remain normal. Patent carotid termini. MCA and ACA origins remain normal. The left A1 segment is mildly dominant as before. The posterior communicating scratched at the anterior communicating artery is fenestrated, normal variant. Visible ACA branches are stable and within normal limits.  The left MCA M1 segment and bifurcation remain patent. There is mild irregularity and stenosis at the distal M1 and bifurcation, more affecting the anterior left M2 origin. Stable visible left  MCA branches.  The right MCA M1 segment and trifurcation remain patent. There is mild irregularity and stenosis at the distal right M1 which is stable (series 103, image 11). The visible right MCA branches are stable.  IMPRESSION: 1. Two small acute lacunar infarcts in the right hemisphere, the larger at the right external capsule corresponding to the indeterminate CT finding yesterday. No associated hemorrhage or mass effect. 2. No intracranial artery occlusion identified. Mild intracranial atherosclerosis and stenosis in both the anterior and posterior circulation appears stable since 06/28/2016. 3. The MRI portion of the exam had to be discontinued prior to completion due to patient confusion.    brain MRI is reviewed in person.there is a bright signal seen on DWI involving the posterior limb of internal capsule extending into the basal ganglia seen on a few cuts mild to moderate in size. This is on the right side. There is a fainter's tiny lesion also DWI involving right atria. MRA shows no large vessel occlusive disease.     Echocardiography: - Left ventricle: Difficult to assess LVEF even with Definity LVEF   is depressed at approximately 40% with basal inferior akinesis;   hypokinesis elsewhere. The cavity size was normal. Wall thickness   was increased in a pattern of moderate LVH. - Left atrium: The atrium was moderately to severely dilated. - Right ventricle: RV is mildly dilated and RVEF appeasrs mildly   reduced. - Right atrium: The atrium was mildly dilated.  Griffin Dewilde A. Merlene Laughter, M.D.  Diplomate, Tax adviser of Psychiatry and Neurology ( Neurology). 06/05/2017, 8:56 AM

## 2017-06-05 NOTE — Progress Notes (Addendum)
Physical Therapy Treatment Patient Details Name: Luis ImusDonald W Dicola MRN: [id number] DOB: 07-Oct-1945 Today's Date: 06/05/2017    History of Present Illness Luis Yu is a 72 y.o. male s/p acute CVA after admission with medical history significant of paroxysmal atrial fibrillation on Eliquis, chronic diastolic congestive heart failure, recurrent dental infections, presented to the hospital with fever.  Patient has had a prior stroke and has some residual cognitive effects.  History is mostly obtained from his wife.  She reports that he was in his usual state of health yesterday.  This morning he woke up confused, having chills.  He was noted to be trembling and shaking.  He began coughing.  No vomiting or diarrhea.  He was noted to be lethargic/confused.  He was brought to the emergency room for evaluation where he was noted to be mildly hypotensive/tachycardic.  He received IV fluids with correction of hemodynamics.  He was noted to be febrile with a temperature of 102.  He received Tylenol with improvement of fever.  Imaging has shown evidence of right sided dental infection.     PT Comments    Patient presents in 4 point restraints with his spouse at bedside.  Patient became more confused and combative over night, appears consfused during visit, but able to cooperate with scooting up to head of bed requiring frequent verbal/tacitle cueing with fair carryover.   At the present patient not appropriate for out of bed activities and required 2 person assist with bed in head down position to scoot to head of bed, patient able to assist by pushing with BLE.  Patient will benefit from continued physical therapy in hospital and recommended venue below to increase strength, balance, endurance for safe ADLs and gait.   Follow Up Recommendations  CIR;SNF     Equipment Recommendations  None recommended by PT    Recommendations for Other Services       Precautions / Restrictions Precautions Precautions:  Fall Restrictions Weight Bearing Restrictions: No    Mobility  Bed Mobility Overal bed mobility: Needs Assistance Bed Mobility: Rolling Rolling: Mod assist         General bed mobility comments: Required 2 person assist to scoot up in bed with bed in head down position  Transfers                    Ambulation/Gait                 Stairs            Wheelchair Mobility    Modified Rankin (Stroke Patients Only)       Balance                                            Cognition Arousal/Alertness: Awake/alert Behavior During Therapy: Restless;Anxious Overall Cognitive Status: Impaired/Different from baseline Area of Impairment: Following commands;Safety/judgement                       Following Commands: Follows one step commands inconsistently       General Comments: presents in 4 point restraints      Exercises      General Comments        Pertinent Vitals/Pain Pain Assessment: No/denies pain    Home Living  Prior Function            PT Goals (current goals can now be found in the care plan section) Acute Rehab PT Goals Patient Stated Goal: return home PT Goal Formulation: With patient/family Time For Goal Achievement: 06/26/17 Potential to Achieve Goals: Fair Progress towards PT goals: Not progressing toward goals - comment(change in mental status)    Frequency    7X/week      PT Plan Current plan remains appropriate    Co-evaluation              AM-PAC PT "6 Clicks" Daily Activity  Outcome Measure  Difficulty turning over in bed (including adjusting bedclothes, sheets and blankets)?: A Lot Difficulty moving from lying on back to sitting on the side of the bed? : A Lot Difficulty sitting down on and standing up from a chair with arms (e.g., wheelchair, bedside commode, etc,.)?: Unable Help needed moving to and from a bed to chair (including a  wheelchair)?: Total Help needed walking in hospital room?: Total Help needed climbing 3-5 steps with a railing? : Total 6 Click Score: 8    End of Session   Activity Tolerance: Treatment limited secondary to agitation;Patient limited by fatigue Patient left: in bed;with call bell/phone within reach;with bed alarm set;with family/visitor present Nurse Communication: Mobility status PT Visit Diagnosis: Unsteadiness on feet (R26.81);Other abnormalities of gait and mobility (R26.89);Muscle weakness (generalized) (M62.81)     Time: 4098-1191 PT Time Calculation (min) (ACUTE ONLY): 10 min  Charges:  $Therapeutic Activity: 8-22 mins                    G Codes:       4:00 PM, 07/01/17 Ocie Bob, MPT Physical Therapist with [facility] [phone] office [phone] mobile phone

## 2017-06-05 NOTE — Clinical Social Work Note (Signed)
[facility] that [facility] do not have a [facility] for patient (she indicated that they had to move a resident with pneumonia in the the single room they had for patient). She stated that she hoped to have a bed available for patient when he was ready for discharge.     Shamar Engelmann, Juleen ChinaHeather D, LCSW

## 2017-06-05 NOTE — Progress Notes (Signed)
Rapidly increased confusion agitation- calms easily, follows commands .[name].Medication administered -See MAR.

## 2017-06-05 NOTE — Progress Notes (Signed)
Pt became combative again. MD paged. Received order for ativan 1mg . Ativan given and is having opposite effect on patient. Patient more restless. Scratched and hit this Charity fundraiserN. MD paged again. Restraints reapplied to ankles at this time. Received order for IV Benadryl. Pt still agitated and combative. Prn haldol given Pt less combative but still restless and requiring restraints.

## 2017-06-05 NOTE — Progress Notes (Signed)
Progress note:  Brief summary:  72 year old male admitted to the hospital with fever, altered mental status and right sided dental infection.  Started on intravenous Unasyn.  Mental status has improved to baseline, but he still having fevers.  Influenza panel was noted to be negative.  Patient is having cough and shortness of breath.  CT chest has indicated evidence of pneumonia.  With his history of coughing after eating and drinking, suspect aspiration pneumonia.  Speech therapy has been consulted.  He will need placement in skilled nursing facility on discharge.   Subjective Pt had a bad night, he has been agitated and delirious most of the night, requiring multiple doses of sedatives but not responding much.  He is finally starting to settle down some this morning.  He refused oxygen and bipap last night.  He is refusing to drink the thickened liquids.        Past Medical History:  Diagnosis Date  . Adjustment disorder with mixed emotional features   . Atrial flutter (HCC)   . CAD (coronary artery disease)   . Depression   . Diabetes mellitus type II, uncontrolled (HCC)   . Diabetic peripheral neuropathy (HCC)   . Erectile dysfunction   . GERD (gastroesophageal reflux disease)   . Herpes zoster   . Hyperlipidemia   . Hypertension   . Obesity   . Osteoarthritis   . Postherpetic neuralgia   . Sleep apnea     Past Surgical History:  Procedure Laterality Date  . CARDIOVERSION N/A 11/10/2015   Procedure: CARDIOVERSION;  Surgeon: Laurey Morale, MD;  Location: [facility] ENDOSCOPY;  Service: Cardiovascular;  Laterality: N/A;  . CARDIOVERSION N/A 02/21/2016   Procedure: CARDIOVERSION;  Surgeon: Lewayne Bunting, MD;  Location: [facility] ENDOSCOPY;  Service: Cardiovascular;  Laterality: N/A;  . CORONARY ARTERY BYPASS GRAFT  1996   eith a LIMA to the LAD, saphenous vein graft to the acute marginal, saphenous vein graft to the PDA and saphenous vein graft to the circumflex.  [name] KNEE ARTHROSCOPY   05/2004   right knee  . REPLACEMENT TOTAL KNEE BILATERAL    . SHOULDER SURGERY       reports that  has never smoked. he has never used smokeless tobacco. He reports that he drinks about 1.2 oz of alcohol per week. He reports that he does not use drugs.  No Known Allergies  Family History  Problem Relation Age of Onset  . Coronary artery disease Mother        deceased at 13  . Coronary artery disease Father        died age 101  . Stroke Unknown        half brother  . Stroke Brother     Prior to Admission medications   Medication Sig Start Date End Date Taking? Authorizing Provider  acetaminophen (TYLENOL) 500 MG tablet Take 2 tablets (1,000 mg total) by mouth every 6 (six) hours as needed. Patient taking differently: Take 1,000 mg by mouth every 6 (six) hours as needed for mild pain.  05/24/16   Arby Barrette, MD  apixaban (ELIQUIS) 5 MG TABS tablet Take 1 tablet (5 mg total) by mouth 2 (two) times daily. 08/09/16   Nafziger, Kandee Keen, NP  atorvastatin (LIPITOR) 80 MG tablet Take 1 tablet (80 mg total) by mouth daily. 04/03/17   Nafziger, Kandee Keen, NP  buPROPion (WELLBUTRIN SR) 150 MG 12 hr tablet Take 1 tablet (150 mg total) by mouth 2 (two) times daily. 04/02/17  Nafziger, Kandee Keen, NP  doxycycline (VIBRA-TABS) 100 MG tablet Take 1 tablet (100 mg total) by mouth 2 (two) times daily. 05/01/17   Nafziger, Kandee Keen, NP  furosemide (LASIX) 20 MG tablet Take 2 tablets (40 mg total) by mouth daily. 04/24/17 07/23/17  Lewayne Bunting, MD  glipiZIDE (GLUCOTROL) 10 MG tablet Take 10 mg by mouth daily. 07/14/16   [provider]  glucose blood (ACCU-CHEK ACTIVE STRIPS) test strip Use as instructed 03/29/16   Nafziger, Kandee Keen, NP  HYDROcodone-homatropine (HYCODAN) 5-1.5 MG/5ML syrup Take 5 mLs by mouth every 8 (eight) hours as needed for cough. 04/05/17   Nafziger, Kandee Keen, NP  Insulin Glargine (BASAGLAR KWIKPEN) 100 UNIT/ML SOPN Inject 0.25 mLs (25 Units total) into the skin at bedtime. Patient taking  differently: Inject 20 Units into the skin at bedtime.  07/06/16   Angiulli, Mcarthur Rossetti, PA-C  Insulin Pen Needle (PEN NEEDLES 31GX5/16") 31G X 8 MM MISC Use to inject insulin 5 times a day. 07/06/16   Angiulli, Mcarthur Rossetti, PA-C  isosorbide mononitrate (IMDUR) 30 MG 24 hr tablet Take 1 tablet (30 mg total) by mouth daily. 08/09/16   Nafziger, Kandee Keen, NP  Lancets (ACCU-CHEK MULTICLIX) lancets Use up to 5 times a day to check blood sugar. 07/06/16   Angiulli, Mcarthur Rossetti, PA-C  losartan (COZAAR) 50 MG tablet Take 1 tablet (50 mg total) by mouth daily. 05/02/17 04/27/18  Azalee Course, PA  metFORMIN (GLUCOPHAGE) 1000 MG tablet TAKE 1 TABLET (1,000 MG TOTAL) BY MOUTH 2 (TWO) TIMES DAILY WITH A MEAL. 12/12/16   Nafziger, Kandee Keen, NP  mirtazapine (REMERON) 15 MG tablet Take 15 mg by mouth at bedtime.    [provider]  Nebivolol HCl (BYSTOLIC) 20 MG TABS Take 1 tablet by mouth daily.    [provider]  omeprazole (PRILOSEC) 20 MG capsule TAKE ONE CAPSULE TWICE A DAY BEFORE A MEAL 05/21/17   Nafziger, Kandee Keen, NP  thiamine 100 MG tablet Take 1 tablet (100 mg total) by mouth daily. 08/09/16   Nafziger, Kandee Keen, NP  vitamin B-12 (CYANOCOBALAMIN) 1000 MCG tablet Take 2 tablets (2,000 mcg total) by mouth daily. 08/09/16   Shirline Frees, NP    Physical Exam:  Vitals:   06/05/17 0545 06/05/17 0600 06/05/17 0615 06/05/17 0630  BP: 111/63 (!) 139/97 (!) 102/58 139/77  Pulse: 89 93 98 (!) 118  Resp: (!) 27 (!) 33 (!) 32 (!) 26  Temp:      TempSrc:      SpO2: 95% 93% 93% 97%  Weight:      Height:       Exam  General exam: pt is delirious and restrained.  Respiratory system: improved expiratory wheezes. Respiratory effort normal. Cardiovascular system: normal s1, s2 sounds. No murmurs, rubs, gallops. Gastrointestinal system: Abdomen is nondistended, soft and nontender. No organomegaly or masses felt. Normal bowel sounds heard. Central nervous system: No gross motor deficits. Extremities: No C/C/E, +pedal  pulses Skin: No rashes, lesions or ulcers Psychiatry: Normal mood, does not appear anxious.   Labs on Admission: I have personally reviewed following labs and imaging studies  CBC: Recent Labs  Lab 05/30/17 0401 05/31/17 0738 06/02/17 0331 06/03/17 0730 06/04/17 0343  WBC 9.8 7.7 8.5 8.7 9.2  HGB 9.3* 9.1* 9.3* 8.9* 9.1*  HCT 31.3* 30.5* 31.0* 29.7* 29.7*  MCV 101.3* 102.0* 101.6* 101.7* 101.0*  PLT 161 135* 173 156 152   Basic Metabolic Panel: Recent Labs  Lab 05/30/17 0401 05/31/17 4098 06/02/17 0331 06/03/17 0513 06/04/17 0343  NA 141 143 144 142 143  K 4.0 3.7 3.4* 3.5 3.7  CL 107 106 105 102 106  CO2 23 29 25 30 27   GLUCOSE 113* 132* 126* 143* 115*  BUN 31* 32* 26* 23* 25*  CREATININE 1.28* 1.23 1.10 1.17 0.92  CALCIUM 9.0 8.8* 9.0 8.6* 8.7*  MG  --   --   --  1.9  --    GFR: Estimated Creatinine Clearance: 86.7 mL/min (by C-G formula based on SCr of 0.92 mg/dL). Liver Function Tests: No results for input(s): AST, ALT, ALKPHOS, BILITOT, PROT, ALBUMIN in the last 168 hours. No results for input(s): LIPASE, AMYLASE in the last 168 hours. No results for input(s): AMMONIA in the last 168 hours. Coagulation Profile: No results for input(s): INR, PROTIME in the last 168 hours. Cardiac Enzymes: No results for input(s): CKTOTAL, CKMB, CKMBINDEX, TROPONINI in the last 168 hours. BNP (last 3 results) No results for input(s): PROBNP in the last 8760 hours. HbA1C: Recent Labs    06/03/17 0513  HGBA1C 5.2   CBG: Recent Labs  Lab 06/03/17 2110 06/04/17 0741 06/04/17 1151 06/04/17 1647 06/04/17 2118  GLUCAP 122* 101* 113* 187* 197*   Lipid Profile: Recent Labs    06/03/17 0513  CHOL 96  HDL 24*  LDLCALC 58  TRIG 72  CHOLHDL 4.0   Thyroid Function Tests: No results for input(s): TSH, T4TOTAL, FREET4, T3FREE, THYROIDAB in the last 72 hours. Anemia Panel: No results for input(s): VITAMINB12, FOLATE, FERRITIN, TIBC, IRON, RETICCTPCT in the last 72  hours. Urine analysis:    Component Value Date/Time   COLORURINE AMBER (A) 05/29/2017 0523   APPEARANCEUR HAZY (A) 05/29/2017 0523   LABSPEC 1.020 05/29/2017 0523   PHURINE 5.0 05/29/2017 0523   GLUCOSEU NEGATIVE 05/29/2017 0523   GLUCOSEU NEGATIVE 05/27/2007 0844   HGBUR NEGATIVE 05/29/2017 0523   HGBUR negative 05/05/2010 1041   BILIRUBINUR NEGATIVE 05/29/2017 0523   KETONESUR NEGATIVE 05/29/2017 0523   PROTEINUR 30 (A) 05/29/2017 0523   UROBILINOGEN 0.2 05/05/2010 1041   NITRITE NEGATIVE 05/29/2017 0523   LEUKOCYTESUR NEGATIVE 05/29/2017 0523    Radiological Exams on Admission: Mr Maxine Glenn Head Wo Contrast  Result Date: 06/03/2017 CLINICAL DATA:  72 year old male with altered mental status and confusion for 2 days. Age indeterminate small vessel ischemia in the right external capsule on head CT yesterday. EXAM: MRI HEAD WITHOUT CONTRAST MRA HEAD WITHOUT CONTRAST TECHNIQUE: Multiplanar, multiecho pulse sequences of the brain and surrounding structures were obtained without intravenous contrast. Angiographic images of the head were obtained using MRA technique without contrast. COMPARISON:  Head CT without contrast 06/02/2017. Brain MRI and intracranial MRA 06/28/2016. FINDINGS: MRI HEAD FINDINGS The examination had to be discontinued prior to completion due to patient confusion. Axial and coronal diffusion weighted imaging, sagittal T1 and axial T2 weighted imaging only was obtained. Brain: The posterior limb right internal capsule area of hypodensity seen by CT does correspond to an oval 11 millimeter area of restricted diffusion (series 3, image 87). There is associated T2 hyperintensity, with no mass effect or evidence of associated hemorrhage. There is also a tiny additional subcortical white matter focus of restricted diffusion in the superolateral right occipital lobe bordering the posterior temporal lobe on series 3, image 85. No contralateral or posterior fossa restricted diffusion.  Stable cerebral volume. Chronic lacunar infarcts in the left superior cerebellum, bilateral thalami, left corona radiata. No no midline shift, mass effect, evidence of mass lesion, ventriculomegaly, extra-axial collection or acute intracranial hemorrhage.  Cervicomedullary junction and pituitary are within normal limits. Vascular: Major intracranial vascular flow voids are stable since 2018. Skull and upper cervical spine: Stable. Sinuses/Orbits: Stable. Other: Stable trace left mastoid fluid. Small volume retained secretions in the nasopharynx. MRA HEAD FINDINGS Stable antegrade flow in the posterior circulation. Chronic irregularity and mild stenosis in the right vertebral artery V4 segment. Both PICA origins remain patent. Negative distal left vertebral artery. Patent vertebrobasilar junction and basilar artery without stenosis. SCA and PCA origins remain normal. PCA branches are stable and within normal limits. Both posterior communicating arteries are present. Stable antegrade flow in both ICA siphons. Mild siphon irregularity with no stenosis. Ophthalmic and posterior communicating artery origins remain normal. Patent carotid termini. MCA and ACA origins remain normal. The left A1 segment is mildly dominant as before. The posterior communicating scratched at the anterior communicating artery is fenestrated, normal variant. Visible ACA branches are stable and within normal limits. The left MCA M1 segment and bifurcation remain patent. There is mild irregularity and stenosis at the distal M1 and bifurcation, more affecting the anterior left M2 origin. Stable visible left MCA branches. The right MCA M1 segment and trifurcation remain patent. There is mild irregularity and stenosis at the distal right M1 which is stable (series 103, image 11). The visible right MCA branches are stable. IMPRESSION: 1. Two small acute lacunar infarcts in the right hemisphere, the larger at the right external capsule corresponding to  the indeterminate CT finding yesterday. No associated hemorrhage or mass effect. 2. No intracranial artery occlusion identified. Mild intracranial atherosclerosis and stenosis in both the anterior and posterior circulation appears stable since 06/28/2016. 3. The MRI portion of the exam had to be discontinued prior to completion due to patient confusion. Electronically Signed   By: Odessa Fleming M.D.   On: 06/03/2017 10:33   US Carotid Bilateral  Result Date: 06/04/2017 CLINICAL DATA:  72 year old male with acute small right-sided lacunar infarcts. EXAM: BILATERAL CAROTID DUPLEX ULTRASOUND TECHNIQUE: Frutiger scale imaging, color Doppler and duplex ultrasound were performed of bilateral carotid and vertebral arteries in the neck. COMPARISON:  None. FINDINGS: Criteria: Quantification of carotid stenosis is based on velocity parameters that correlate the residual internal carotid diameter with NASCET-based stenosis levels, using the diameter of the distal internal carotid lumen as the denominator for stenosis measurement. The following velocity measurements were obtained: RIGHT ICA:  111/28 cm/sec CCA:  84/14 cm/sec SYSTOLIC ICA/CCA RATIO:  1.3 DIASTOLIC ICA/CCA RATIO:  2.0 ECA:  89 cm/sec LEFT ICA:  138/36 cm/sec CCA:  78/15 cm/sec SYSTOLIC ICA/CCA RATIO:  1.8 DIASTOLIC ICA/CCA RATIO:  2.4 ECA:  107 cm/sec RIGHT CAROTID ARTERY: Heterogeneous atherosclerotic plaque throughout the common carotid artery extending into the internal carotid artery. By peak systolic velocity criteria, the estimated stenosis remains less than 50%. RIGHT VERTEBRAL ARTERY:  Patent with antegrade flow. LEFT CAROTID ARTERY: Heterogeneous atherosclerotic plaque in the proximal internal carotid artery. By peak systolic velocity criteria, the estimated stenosis falls in the 50-69% diameter range. LEFT VERTEBRAL ARTERY:  Patent with normal antegrade flow. IMPRESSION: 1. Mild (1-49%) stenosis proximal right internal carotid artery secondary to heterogenous  atherosclerotic plaque. 2. Moderate (50-69%) stenosis proximal left internal carotid artery secondary to heterogenous atherosclerotic plaque. 3. Vertebral arteries are patent with antegrade flow. Signed, Sterling Big, MD [facility] Electronically Signed   By: Malachy Moan M.D.   On: 06/04/2017 08:01   Dg Swallowing Func-speech Pathology  Result Date: 06/04/2017 Objective Swallowing Evaluation: Type  of Study: MBS-Modified Barium Swallow Study  Patient Details Name: Doretha ImusDonald W Thornhill MRN: [id number] Date of Birth: 10/13/1945 Today's Date: 06/04/2017 Time: SLP Start Time (ACUTE ONLY): 1348 -SLP Stop Time (ACUTE ONLY): 1417 SLP Time Calculation (min) (ACUTE ONLY): 29 min Past Medical History: Past Medical History: Diagnosis Date . Adjustment disorder with mixed emotional features  . Atrial flutter (HCC)  . CAD (coronary artery disease)  . Depression  . Diabetes mellitus type II, uncontrolled (HCC)  . Diabetic peripheral neuropathy (HCC)  . Erectile dysfunction  . GERD (gastroesophageal reflux disease)  . Herpes zoster  . Hyperlipidemia  . Hypertension  . Obesity  . Osteoarthritis  . Postherpetic neuralgia  . Sleep apnea  Past Surgical History: Past Surgical History: Procedure Laterality Date . CARDIOVERSION N/A 11/10/2015  Procedure: CARDIOVERSION;  Surgeon: Laurey Moralealton S McLean, MD;  [facility];  Service: Cardiovascular;  Laterality: N/A; . CARDIOVERSION N/A 02/21/2016  Procedure: CARDIOVERSION;  Surgeon: Lewayne BuntingBrian S Crenshaw, MD;  [facility];  Service: Cardiovascular;  Laterality: N/A; . CORONARY ARTERY BYPASS GRAFT  1996  eith a LIMA to the LAD, saphenous vein graft to the acute marginal, saphenous vein graft to the PDA and saphenous vein graft to the circumflex. [name]. KNEE ARTHROSCOPY  05/2004  right knee . REPLACEMENT TOTAL KNEE BILATERAL   . SHOULDER SURGERY   HPI: This is 72 year old white male who presents with fever and shaking along  with facial swelling. It appears the patient had the dental abscess associated with cellulitis. He has been worked up extensively. Appears up blood cultures have not been positive. He has had x-Angello showing the pneumonia presumably due to aspiration. Patient has been encephalopathic and agitated which resulted in the current consultation. He had a stroke about a year ago. The wife reports that he had significant right-sided weakness with a stroke and did have some residual problems from this. She tells me that he did have a footdrop on the right related to his stroke although he has had some neuropathy which may have contributed. Neuropathy is a more longstanding issue than the recent stroke a year ago. He is noted to have a mild dysarthria which wife tells me fluctuates and is probably his baseline. The patient's agitation have been occurring during the day and also at nighttime but may have been worse at nighttime and worse with of his CPAP machine while he has been hospitalized. He appears to have improved significantly this morning however. She reports that he is pretty close to baseline. The patient himself has little recollection of the events while he is hospitalized in the for canal provide any history. However, the wife does not report any clear focal new symptoms such is worsening dysarthria or worsening weakness on the right side. No left-sided weakness is reported. No visual changes are reported. The review of systems otherwise negative. Multiple infarcts both acute and chronic with increased risk of vascular dementia. Wife reports that he often became "choked up" after eating or drinking. CT chest performed whichconfirmed evidence of aspiration pneumonia. Antibiotic coverage has been broadened to vancomycin and Zosyn. Speech therapy consulted for swallow evaluation.  Subjective: "You haven't given me anything to eat." (SLP just did BSE with foods) Assessment / Plan / Recommendation  CHL IP CLINICAL IMPRESSIONS 06/04/2017 Clinical Impression Pt presents with moderate pharyngeal phase dysphagia characterized by delay in swallow initiation with swallow trigger after filling the valleculae, reduced hyolaryngeal excursion and epiglottic deflection resulting in penetration during and after the swallow with thins, nectars-  trace, and slight with honey-thick liquids. Chin tuck was implemented with mod/max verbal and tactile cues and was consistently effective in preventing aspiration with thin liquids, however Pt unable to complete without cues due to cognitive status. Pt with trace aspiration with large cups sips and straw sips after the swallow from penetrant moving down anterior aspect of vocal folds; penetration occurred in greater amounts with straw sips thin and was not removed until SLP cued Pt for strong cough. Given current PNA and altered mental status, will change diet to nectar-thick liquids and continue regular textures with follow up dysphagia intervention at receiving facility. SLP has not been able to speak with Pt's wife yet, however chart review reveals that wife has noted frequent coughing with meals in the recent past. Prognosis to upgrade to thin liquids is good post discharge as Pt has a very strong and effective cough, but needs cues to do so. A chin tuck was effective with thin liquids and can be used if Pt able to successfully implement (he is not able to at this time due to cognitive status). Pharyngeal phase of swallow was WNL with puree and regular textures. Recommend Regular textures and nectar-thick liquids with po meds whole one at a time with NTL or in puree. OK for thin liquid trials with SLP with strict use of chin tuck. SLP Visit Diagnosis Dysphagia, oropharyngeal phase (R13.12) Attention and concentration deficit following -- Frontal lobe and executive function deficit following -- Impact on safety and function Moderate aspiration risk   CHL IP TREATMENT  RECOMMENDATION 06/04/2017 Treatment Recommendations Therapy as outlined in treatment plan below   Prognosis 06/04/2017 Prognosis for Safe Diet Advancement Fair Barriers to Reach Goals Cognitive deficits Barriers/Prognosis Comment -- CHL IP DIET RECOMMENDATION 06/04/2017 SLP Diet Recommendations Regular solids;Nectar thick liquid Liquid Administration via Cup;No straw Medication Administration Whole meds with liquid Compensations Slow rate;Small sips/bites;Clear throat intermittently Postural Changes Seated upright at 90 degrees;Remain semi-upright after after feeds/meals (Comment)   CHL IP OTHER RECOMMENDATIONS 06/04/2017 Recommended Consults -- Oral Care Recommendations Oral care BID Other Recommendations Clarify dietary restrictions   CHL IP FOLLOW UP RECOMMENDATIONS 06/04/2017 Follow up Recommendations Skilled Nursing facility;Inpatient Rehab   CHL IP FREQUENCY AND DURATION 06/04/2017 Speech Therapy Frequency (ACUTE ONLY) min 2x/week Treatment Duration 1 week      CHL IP ORAL PHASE 06/04/2017 Oral Phase WFL Oral - Pudding Teaspoon -- Oral - Pudding Cup -- Oral - Honey Teaspoon -- Oral - Honey Cup -- Oral - Nectar Teaspoon -- Oral - Nectar Cup -- Oral - Nectar Straw -- Oral - Thin Teaspoon -- Oral - Thin Cup -- Oral - Thin Straw -- Oral - Puree -- Oral - Mech Soft -- Oral - Regular -- Oral - Multi-Consistency -- Oral - Pill -- Oral Phase - Comment --  CHL IP PHARYNGEAL PHASE 06/04/2017 Pharyngeal Phase Impaired Pharyngeal- Pudding Teaspoon -- Pharyngeal -- Pharyngeal- Pudding Cup -- Pharyngeal -- Pharyngeal- Honey Teaspoon -- Pharyngeal -- Pharyngeal- Honey Cup -- Pharyngeal -- Pharyngeal- Nectar Teaspoon -- Pharyngeal -- Pharyngeal- Nectar Cup -- Pharyngeal Material does not enter airway;Material enters airway, remains ABOVE vocal cords then ejected out;Material enters airway, remains ABOVE vocal cords and not ejected out Pharyngeal- Nectar Straw -- Pharyngeal -- Pharyngeal- Thin Teaspoon Delayed swallow  initiation-vallecula Pharyngeal -- Pharyngeal- Thin Cup Delayed swallow initiation-vallecula;Reduced epiglottic inversion;Reduced anterior laryngeal mobility;Reduced laryngeal elevation;Reduced airway/laryngeal closure;Penetration/Aspiration during swallow;Penetration/Apiration after swallow;Trace aspiration;Pharyngeal residue - valleculae Pharyngeal Material does not enter airway;Material enters airway, remains ABOVE vocal cords then ejected  out;Material enters airway, passes BELOW cords then ejected out;Material enters airway, remains ABOVE vocal cords and not ejected out;Material enters airway, CONTACTS cords and then ejected out Pharyngeal- Thin Straw Delayed swallow initiation-pyriform sinuses;Reduced anterior laryngeal mobility;Reduced laryngeal elevation;Reduced airway/laryngeal closure;Penetration/Aspiration during swallow;Penetration/Apiration after swallow;Trace aspiration;Pharyngeal residue - valleculae;Pharyngeal residue - pyriform Pharyngeal Material enters airway, remains ABOVE vocal cords then ejected out;Material enters airway, CONTACTS cords and then ejected out;Material enters airway, CONTACTS cords and not ejected out;Material enters airway, passes BELOW cords then ejected out Pharyngeal- Puree WFL Pharyngeal -- Pharyngeal- Mechanical Soft -- Pharyngeal -- Pharyngeal- Regular WFL Pharyngeal -- Pharyngeal- Multi-consistency -- Pharyngeal -- Pharyngeal- Pill WFL Pharyngeal -- Pharyngeal Comment --  CHL IP CERVICAL ESOPHAGEAL PHASE 06/04/2017 Cervical Esophageal Phase WFL Pudding Teaspoon -- Pudding Cup -- Honey Teaspoon -- Honey Cup -- Nectar Teaspoon -- Nectar Cup -- Nectar Straw -- Thin Teaspoon -- Thin Cup -- Thin Straw -- Puree -- Mechanical Soft -- Regular -- Multi-consistency -- Pill -- Cervical Esophageal Comment -- No flowsheet data found. Thank you, Havery Moros, CCC-SLP [id number] PORTER,DABNEY 06/04/2017, 6:34 PM              Mr Brain Limited Wo Contrast  Result Date:  06/03/2017 CLINICAL DATA:  72 year old male with altered mental status and confusion for 2 days. Age indeterminate small vessel ischemia in the right external capsule on head CT yesterday. EXAM: MRI HEAD WITHOUT CONTRAST MRA HEAD WITHOUT CONTRAST TECHNIQUE: Multiplanar, multiecho pulse sequences of the brain and surrounding structures were obtained without intravenous contrast. Angiographic images of the head were obtained using MRA technique without contrast. COMPARISON:  Head CT without contrast 06/02/2017. Brain MRI and intracranial MRA 06/28/2016. FINDINGS: MRI HEAD FINDINGS The examination had to be discontinued prior to completion due to patient confusion. Axial and coronal diffusion weighted imaging, sagittal T1 and axial T2 weighted imaging only was obtained. Brain: The posterior limb right internal capsule area of hypodensity seen by CT does correspond to an oval 11 millimeter area of restricted diffusion (series 3, image 87). There is associated T2 hyperintensity, with no mass effect or evidence of associated hemorrhage. There is also a tiny additional subcortical white matter focus of restricted diffusion in the superolateral right occipital lobe bordering the posterior temporal lobe on series 3, image 85. No contralateral or posterior fossa restricted diffusion. Stable cerebral volume. Chronic lacunar infarcts in the left superior cerebellum, bilateral thalami, left corona radiata. No no midline shift, mass effect, evidence of mass lesion, ventriculomegaly, extra-axial collection or acute intracranial hemorrhage. Cervicomedullary junction and pituitary are within normal limits. Vascular: Major intracranial vascular flow voids are stable since 2018. Skull and upper cervical spine: Stable. Sinuses/Orbits: Stable. Other: Stable trace left mastoid fluid. Small volume retained secretions in the nasopharynx. MRA HEAD FINDINGS Stable antegrade flow in the posterior circulation. Chronic irregularity and mild  stenosis in the right vertebral artery V4 segment. Both PICA origins remain patent. Negative distal left vertebral artery. Patent vertebrobasilar junction and basilar artery without stenosis. SCA and PCA origins remain normal. PCA branches are stable and within normal limits. Both posterior communicating arteries are present. Stable antegrade flow in both ICA siphons. Mild siphon irregularity with no stenosis. Ophthalmic and posterior communicating artery origins remain normal. Patent carotid termini. MCA and ACA origins remain normal. The left A1 segment is mildly dominant as before. The posterior communicating scratched at the anterior communicating artery is fenestrated, normal variant. Visible ACA branches are stable and within normal limits. The left MCA M1 segment and bifurcation remain  patent. There is mild irregularity and stenosis at the distal M1 and bifurcation, more affecting the anterior left M2 origin. Stable visible left MCA branches. The right MCA M1 segment and trifurcation remain patent. There is mild irregularity and stenosis at the distal right M1 which is stable (series 103, image 11). The visible right MCA branches are stable. IMPRESSION: 1. Two small acute lacunar infarcts in the right hemisphere, the larger at the right external capsule corresponding to the indeterminate CT finding yesterday. No associated hemorrhage or mass effect. 2. No intracranial artery occlusion identified. Mild intracranial atherosclerosis and stenosis in both the anterior and posterior circulation appears stable since 06/28/2016. 3. The MRI portion of the exam had to be discontinued prior to completion due to patient confusion. Electronically Signed   By: Odessa Fleming M.D.   On: 06/03/2017 10:33   EKG: Independently reviewed.  Atrial fibrillation with a left bundle branch block, no significant change from prior tracings  Assessment/Plan  1. Sepsis - secondary to acute right-sided dental infection.  Facial swelling  improved.  CT confirms dental infection without any drainable abscess. Continue IV unasyn with improvement of right face swelling.  2. Aspiration pneumonia. IV unasyn should complete today after 7 days treatment.   Speech therapy consulted for swallow evaluation. 3. Paroxysmal atrial fibrillation.  Chads vas 2 score of at least 3-  continue on rate control with nebivolol and anticoagulation with Eliquis. 4. Acute CVA - I have ordered stroke work up and neurology consultation.   See neuro notes, will continue full anticoagulation.   5. Chronic diastolic congestive heart failure.  EF on recent echo 55%.  Appears stable at this time.  Holding Lasix as he is not drinking well.  Weight appears to be near baseline.  Continue Imdur 6. GERD.  Continue on PPI 7. Hyperlipidemia.  Continue on statin. 8. DM type II.  Lantus and supplemental sliding scale.  Blood sugars are currently stable.  Continue to monitor. 9. Hypertension.  Continue on Bystolic, losartan 10. Acute Encephalopathy - likely related to acute CVA. Neurology consult requested.    11. Urinary retention.  Patient required Foley catheter placement.  Flomax has been added. DC foley, voiding trial.   DVT prophylaxis: Eliquis Code Status: DNR Family Communication: wife present and updated at length Disposition Plan: when medically stabilized, planning for rehab placement Consults called: None  Critical Care Time Spent : 34 mins  Shacoya Burkhammer M.D on 06/05/2017 at 6:37 AM  Between 7am to 7pm - Pager - [phone]

## 2017-06-06 DIAGNOSIS — E43 Unspecified severe protein-calorie malnutrition: Secondary | ICD-10-CM

## 2017-06-06 LAB — BASIC METABOLIC PANEL
Anion gap: 10 (ref 5–15)
BUN: 24 mg/dL — ABNORMAL HIGH (ref 6–20)
CO2: 26 mmol/L (ref 22–32)
Calcium: 8.4 mg/dL — ABNORMAL LOW (ref 8.9–10.3)
Chloride: 107 mmol/L (ref 101–111)
Creatinine, Ser: 0.88 mg/dL (ref 0.61–1.24)
GFR calc Af Amer: >60 mL/min (ref >60)
GFR calc non Af Amer: >60 mL/min (ref >60)
Glucose, Bld: 136 mg/dL — ABNORMAL HIGH (ref 65–99)
Potassium: 3.4 mmol/L — ABNORMAL LOW (ref 3.5–5.1)
Sodium: 143 mmol/L (ref 135–145)

## 2017-06-06 LAB — CBC
HCT: 29.8 % — ABNORMAL LOW (ref 39.0–52.0)
Hemoglobin: 8.8 g/dL — ABNORMAL LOW (ref 13.0–17.0)
MCH: 30.1 pg (ref 26.0–34.0)
MCHC: 29.5 g/dL — ABNORMAL LOW (ref 30.0–36.0)
MCV: 102.1 fL — ABNORMAL HIGH (ref 78.0–100.0)
Platelets: 163 10*3/uL (ref 150–400)
RBC: 2.92 MIL/uL — ABNORMAL LOW (ref 4.22–5.81)
RDW: 18 % — ABNORMAL HIGH (ref 11.5–15.5)
WBC: 7.7 10*3/uL (ref 4.0–10.5)

## 2017-06-06 LAB — GLUCOSE, CAPILLARY
Glucose-Capillary: 131 mg/dL — ABNORMAL HIGH (ref 65–99)
Glucose-Capillary: 116 mg/dL — ABNORMAL HIGH (ref 65–99)
Glucose-Capillary: 190 mg/dL — ABNORMAL HIGH (ref 65–99)
Glucose-Capillary: 276 mg/dL — ABNORMAL HIGH (ref 65–99)

## 2017-06-06 LAB — MAGNESIUM: Magnesium: 1.9 mg/dL (ref 1.7–2.4)

## 2017-06-06 MED ORDER — ADULT MULTIVITAMIN W/MINERALS CH
1.0000 | ORAL_TABLET | Freq: Every day | ORAL | Status: DC
  Administered 2017-06-06 – 2017-06-12 (×7): 1 via ORAL
  Filled 2017-06-06 (×8): qty 1

## 2017-06-06 MED ORDER — DONEPEZIL HCL 5 MG PO TABS
5.0000 mg | ORAL_TABLET | Freq: Every morning | ORAL | Status: DC
  Administered 2017-06-07 – 2017-06-15 (×7): 5 mg via ORAL
  Filled 2017-06-06 (×7): qty 1

## 2017-06-06 MED ORDER — ENSURE ENLIVE PO LIQD
237.0000 mL | Freq: Three times a day (TID) | ORAL | Status: DC
  Administered 2017-06-07 – 2017-06-12 (×10): 237 mL via ORAL

## 2017-06-06 NOTE — Care Management (Signed)
Discussed CIR vs SNF with wife. She prefers CIR. Discussed patient with CIR coordinator, as of this moment, patient is not a good candidate for CIR nor do they have a bed available.   CIR coordinator will still follow patient. IF patient's behavior improves dramatically AND [facility], CIR will consider. CSW is working patient up for SNF in the meantime and with patient's current state, SNF will be only option.

## 2017-06-06 NOTE — Progress Notes (Signed)
PT Cancellation Note  Patient Details Name: Luis Yu MRN: [id number] DOB: [birth year]   Cancelled Treatment:    Reason Eval/Treat Not Completed: Fatigue/lethargy limiting ability to participate.  Patient presents lethargic with legs hanging of side of bed and attempting to pull gown off, RN came to room to assist with repositioning patient in bed.  Will check back tomorrow.   3:51 PM, 06/06/17 Ocie BobJames Moataz Tavis, MPT Physical Therapist with [facility] [phone] office [phone] mobile phone

## 2017-06-06 NOTE — Progress Notes (Signed)
Initial Nutrition Assessment  DOCUMENTATION CODES:   Severe malnutrition in context of acute illness/injury  INTERVENTION:   - Ensure Enlive po TID between meals, each supplement provides 350 kcal and 20 grams of protein  - Magic cup TID with meals, each supplement provides 290 kcal and 9 grams of protein  - MVI with minerals  NUTRITION DIAGNOSIS:   Severe Malnutrition related to acute illness(severe toxic encephaloaty with delirium) as evidenced by percent weight loss, energy intake < or equal to 50% for > or equal to 5 days(4% weight loss in 8 days).  GOAL:   Patient will meet greater than or equal to 90% of their needs  MONITOR:   PO intake, Supplement acceptance, I & O's, Weight trends  REASON FOR ASSESSMENT:   Malnutrition Screening Tool   ASSESSMENT:   72 year old male with PMH significant for atrial fibrillation, CHF, GERD, HLD, HTN, and DM type 2. Admitted 05/29/17 for severe toxic encephalopathy with delirium. Pt with increased agitation and delirium through hospital admission and was transferred to ICU.  RD noted SLP recommended thickened liquids but that pt has been refusing to try. Pt's wife states she saw pt trying some of the thickened water earlier today. Per RN, pt has been consuming and tolerating Ensure Enlive oral nutrition supplements without addition of a thickening agent.  Pt being followed by neurology for delirium and sundowning.  RD spoke with pt's wife at length as pt was resting. Pt's wife requested that RD come back at a later date to speak with pt.  Pt's wife states that pt just recently started having a decreased appetite (February 2019). Pt's wife reports pt was "eating good at home" but that he has not been eating that way during current admission. Pt's wife states that in the past two days, pt has consumed approximately half of a milkshake that she brought, ice cream, bites of a bacon/egg/cheese biscuit, various items at breakfast this morning,  and bites of lunch today. Per RN note, pt ate breakfast this morning. No meal completion logged in pt's chart for today.  Pt's wife states she was happy that pt received and consumed an Ensure Enlive oral nutrition supplement this morning. RD to increase order from BID to TID. Pt's wife also agreeable to pt receiving Magic Cup with meal trays. RD to order. Pt's wife states pt drank 1-2 Glucerna supplements daily PTA.  RD noted weight loss of over 8 lbs since admission. This corresponds to a 4% weight loss in 8 days which is significant for timeframe. RD suspects some of weight loss related to negative I/O status. Pt's wife states pt weighed 209 lbs on admission. Pt's wife also states that pt's UBW was 227-230 lbs and that he last weighed this in April 2018 prior to his stroke. In addition, pt's wife states that pt stopped PT in July 2018 and has not been active since.  Meal Completion: 10-50% since 06/02/17 (5 days)  I/Os: -2.6 L since admission  Medications reviewed and include: sliding scale Novolog, 10 units Lantus daily, 15 mg Remeron daily, 40 mg Protonix BID, 2 tablets Senokot-S BID, 100 mg thiamine daily, 2000 mcg vitamin B-12 daily  Labs reviewed: potassium 3.4, BUN 24, hemoglobin 8.8, hematocrit 29.8 CBGs: 116, 131, 104, 133 x 24 hours  NUTRITION - FOCUSED PHYSICAL EXAM: Pt's wife requested RD not complete NFPE at this time as pt had been restless and was finally resting. Pt's wife requested RD come back another day. RD to follow.  Most Recent Value  Orbital Region  Unable to assess - pt's wife declined  Upper Arm Region  Unable to assess  Thoracic and Lumbar Region  Unable to assess  Buccal Region  Unable to assess  Temple Region  Unable to assess  Clavicle Bone Region  Unable to assess  Clavicle and Acromion Bone Region  Unable to assess  Scapular Bone Region  Unable to assess  Dorsal Hand  Unable to assess  Patellar Region  Unable to assess  Anterior Thigh Region  Unable to  assess  Posterior Calf Region  Unable to assess  Edema (RD Assessment)  Unable to assess  Hair  Reviewed  Eyes  Reviewed  Mouth  Reviewed  Skin  Reviewed  Nails  Reviewed   Diet Order:  Diet regular Room service appropriate? Yes; Fluid consistency: Nectar Thick  EDUCATION NEEDS:   No education needs have been identified at this time  Skin:  Skin Assessment: Reviewed RN Assessment  Last BM:  06/05/17 large type 6  Height:   Ht Readings from Last 1 Encounters:  06/03/17 6\' 3"  (1.905 m)    Weight:   Wt Readings from Last 1 Encounters:  06/06/17 198 lb 10.2 oz (90.1 kg)    Ideal Body Weight:  89.1 kg  BMI:  Body mass index is 24.83 kg/m.  Estimated Nutritional Needs:   Kcal:  2300-2500 kcal/day  Protein:  110-125 grams/day  Fluid:  >2.0 L/day    Earma Reading, MS, RD, LDN Pager: [phone] Weekend/After Hours: [phone]

## 2017-06-06 NOTE — Progress Notes (Signed)
SLP Cancellation Note  Patient Details Name: Luis Yu MRN: [id number] DOB: Feb 07, 1946   Cancelled treatment:       Reason Eval/Treat Not Completed: Fatigue/lethargy limiting ability to participate   Tressie StalkerPat Adams, M.S., CCC-SLP 06/06/2017, 4:15 PM

## 2017-06-06 NOTE — Care Management Note (Signed)
Case Management Note  Patient Details  Name: Luis Yu MRN: [id number] Date of Birth: August 22, 1945  If discussed at Long Length of Stay Meetings, dates discussed:  06/06/2017  Additional Comments:  Beyonca Wisz, Chrystine OilerSharley Diane, RN 06/06/2017, 12:47 PM

## 2017-06-06 NOTE — Progress Notes (Signed)
Bladder scan at 0300 resulting in a 3 scan average of 130 cc.

## 2017-06-06 NOTE — Progress Notes (Addendum)
Progress note:  Brief summary:  72 year old male admitted to the hospital with fever, altered mental status and right sided dental infection.  Started on intravenous Unasyn.  Mental status has improved to baseline, but he still having fevers.  Influenza panel was noted to be negative.  Patient is having cough and shortness of breath.  CT chest has indicated evidence of pneumonia.  With his history of coughing after eating and drinking, suspect aspiration pneumonia.  Speech therapy has been consulted.  He will need placement in skilled nursing facility on discharge.   Subjective Pt had another bad night, but it was reported as not as bad as before, he continues to be agitated and delirious most of the night, combattive at times but that has gotten better,  He wouldn't tolerate much bipap treatment, beginning to settle down this morning.         Past Medical History:  Diagnosis Date  . Adjustment disorder with mixed emotional features   . Atrial flutter (HCC)   . CAD (coronary artery disease)   . Depression   . Diabetes mellitus type II, uncontrolled (HCC)   . Diabetic peripheral neuropathy (HCC)   . Erectile dysfunction   . GERD (gastroesophageal reflux disease)   . Herpes zoster   . Hyperlipidemia   . Hypertension   . Obesity   . Osteoarthritis   . Postherpetic neuralgia   . Sleep apnea     Past Surgical History:  Procedure Laterality Date  . CARDIOVERSION N/A 11/10/2015   Procedure: CARDIOVERSION;  Surgeon: Laurey Morale, MD;  Location: [facility] ENDOSCOPY;  Service: Cardiovascular;  Laterality: N/A;  . CARDIOVERSION N/A 02/21/2016   Procedure: CARDIOVERSION;  Surgeon: Lewayne Bunting, MD;  Location: [facility] ENDOSCOPY;  Service: Cardiovascular;  Laterality: N/A;  . CORONARY ARTERY BYPASS GRAFT  1996   eith a LIMA to the LAD, saphenous vein graft to the acute marginal, saphenous vein graft to the PDA and saphenous vein graft to the circumflex.  [name] KNEE ARTHROSCOPY  05/2004   right knee    . REPLACEMENT TOTAL KNEE BILATERAL    . SHOULDER SURGERY       reports that he has never smoked. He has never used smokeless tobacco. He reports that he drinks about 1.2 oz of alcohol per week. He reports that he does not use drugs.  Allergies  Allergen Reactions  . Lorazepam Other (See Comments)    Makes more agitated and delirious    Family History  Problem Relation Age of Onset  . Coronary artery disease Mother        deceased at 12  . Coronary artery disease Father        died age 10  . Stroke Unknown        half brother  . Stroke Brother     Prior to Admission medications   Medication Sig Start Date End Date Taking? Authorizing Provider  acetaminophen (TYLENOL) 500 MG tablet Take 2 tablets (1,000 mg total) by mouth every 6 (six) hours as needed. Patient taking differently: Take 1,000 mg by mouth every 6 (six) hours as needed for mild pain.  05/24/16   Arby Barrette, MD  apixaban (ELIQUIS) 5 MG TABS tablet Take 1 tablet (5 mg total) by mouth 2 (two) times daily. 08/09/16   Nafziger, Kandee Keen, NP  atorvastatin (LIPITOR) 80 MG tablet Take 1 tablet (80 mg total) by mouth daily. 04/03/17   Nafziger, Kandee Keen, NP  buPROPion (WELLBUTRIN SR) 150 MG 12 hr tablet Take  1 tablet (150 mg total) by mouth 2 (two) times daily. 04/02/17   Nafziger, Kandee Keen, NP  doxycycline (VIBRA-TABS) 100 MG tablet Take 1 tablet (100 mg total) by mouth 2 (two) times daily. 05/01/17   Nafziger, Kandee Keen, NP  furosemide (LASIX) 20 MG tablet Take 2 tablets (40 mg total) by mouth daily. 04/24/17 07/23/17  Lewayne Bunting, MD  glipiZIDE (GLUCOTROL) 10 MG tablet Take 10 mg by mouth daily. 07/14/16   [provider]  glucose blood (ACCU-CHEK ACTIVE STRIPS) test strip Use as instructed 03/29/16   Nafziger, Kandee Keen, NP  HYDROcodone-homatropine (HYCODAN) 5-1.5 MG/5ML syrup Take 5 mLs by mouth every 8 (eight) hours as needed for cough. 04/05/17   Nafziger, Kandee Keen, NP  Insulin Glargine (BASAGLAR KWIKPEN) 100 UNIT/ML SOPN Inject 0.25 mLs  (25 Units total) into the skin at bedtime. Patient taking differently: Inject 20 Units into the skin at bedtime.  07/06/16   Angiulli, Mcarthur Rossetti, PA-C  Insulin Pen Needle (PEN NEEDLES 31GX5/16") 31G X 8 MM MISC Use to inject insulin 5 times a day. 07/06/16   Angiulli, Mcarthur Rossetti, PA-C  isosorbide mononitrate (IMDUR) 30 MG 24 hr tablet Take 1 tablet (30 mg total) by mouth daily. 08/09/16   Nafziger, Kandee Keen, NP  Lancets (ACCU-CHEK MULTICLIX) lancets Use up to 5 times a day to check blood sugar. 07/06/16   Angiulli, Mcarthur Rossetti, PA-C  losartan (COZAAR) 50 MG tablet Take 1 tablet (50 mg total) by mouth daily. 05/02/17 04/27/18  Azalee Course, PA  metFORMIN (GLUCOPHAGE) 1000 MG tablet TAKE 1 TABLET (1,000 MG TOTAL) BY MOUTH 2 (TWO) TIMES DAILY WITH A MEAL. 12/12/16   Nafziger, Kandee Keen, NP  mirtazapine (REMERON) 15 MG tablet Take 15 mg by mouth at bedtime.    [provider]  Nebivolol HCl (BYSTOLIC) 20 MG TABS Take 1 tablet by mouth daily.    [provider]  omeprazole (PRILOSEC) 20 MG capsule TAKE ONE CAPSULE TWICE A DAY BEFORE A MEAL 05/21/17   Nafziger, Kandee Keen, NP  thiamine 100 MG tablet Take 1 tablet (100 mg total) by mouth daily. 08/09/16   Nafziger, Kandee Keen, NP  vitamin B-12 (CYANOCOBALAMIN) 1000 MCG tablet Take 2 tablets (2,000 mcg total) by mouth daily. 08/09/16   Shirline Frees, NP    Physical Exam:  Vitals:   06/06/17 0727 06/06/17 0800 06/06/17 0841 06/06/17 0900  BP:  (!) 144/86  (!) 155/87  Pulse: 66 69  65  Resp: (!) 24 (!) 25  16  Temp: 98.1 F (36.7 C)     TempSrc: Axillary     SpO2: 95% 92% 94% (!) 86%  Weight:      Height:       Exam  General exam: pt is calm and conversant, confused.  Respiratory system: improved expiratory wheezes. Respiratory effort normal. Cardiovascular system: normal s1, s2 sounds. No murmurs, rubs, gallops. Gastrointestinal system: Abdomen is nondistended, soft and nontender. No organomegaly or masses felt. Normal bowel sounds heard. Central nervous system:  No gross motor deficits. Extremities: No C/C/E, +pedal pulses Skin: No rashes, lesions or ulcers Psychiatry: Normal mood.   Labs on Admission: I have personally reviewed following labs and imaging studies  CBC: Recent Labs  Lab 05/31/17 0738 06/02/17 0331 06/03/17 0730 06/04/17 0343 06/06/17 0516  WBC 7.7 8.5 8.7 9.2 7.7  HGB 9.1* 9.3* 8.9* 9.1* 8.8*  HCT 30.5* 31.0* 29.7* 29.7* 29.8*  MCV 102.0* 101.6* 101.7* 101.0* 102.1*  PLT 135* 173 156 152 163   Basic Metabolic Panel: Recent Labs  Lab 05/31/17 0738 06/02/17 0331 06/03/17 0513 06/04/17 0343 06/05/17 0408 06/06/17 0516  NA 143 144 142 143  --  143  K 3.7 3.4* 3.5 3.7  --  3.4*  CL 106 105 102 106  --  107  CO2 29 25 30 27   --  26  GLUCOSE 132* 126* 143* 115*  --  136*  BUN 32* 26* 23* 25*  --  24*  CREATININE 1.23 1.10 1.17 0.92 0.91 0.88  CALCIUM 8.8* 9.0 8.6* 8.7*  --  8.4*  MG  --   --  1.9  --   --  1.9   GFR: Estimated Creatinine Clearance: 90.7 mL/min (by C-G formula based on SCr of 0.88 mg/dL). Liver Function Tests: No results for input(s): AST, ALT, ALKPHOS, BILITOT, PROT, ALBUMIN in the last 168 hours. No results for input(s): LIPASE, AMYLASE in the last 168 hours. No results for input(s): AMMONIA in the last 168 hours. Coagulation Profile: No results for input(s): INR, PROTIME in the last 168 hours. Cardiac Enzymes: No results for input(s): CKTOTAL, CKMB, CKMBINDEX, TROPONINI in the last 168 hours. BNP (last 3 results) No results for input(s): PROBNP in the last 8760 hours. HbA1C: No results for input(s): HGBA1C in the last 72 hours. CBG: Recent Labs  Lab 06/05/17 0722 06/05/17 1109 06/05/17 1606 06/05/17 2104 06/06/17 0721  GLUCAP 129* 124* 133* 104* 131*   Lipid Profile: No results for input(s): CHOL, HDL, LDLCALC, TRIG, CHOLHDL, LDLDIRECT in the last 72 hours. Thyroid Function Tests: No results for input(s): TSH, T4TOTAL, FREET4, T3FREE, THYROIDAB in the last 72 hours. Anemia  Panel: No results for input(s): VITAMINB12, FOLATE, FERRITIN, TIBC, IRON, RETICCTPCT in the last 72 hours. Urine analysis:    Component Value Date/Time   COLORURINE AMBER (A) 05/29/2017 0523   APPEARANCEUR HAZY (A) 05/29/2017 0523   LABSPEC 1.020 05/29/2017 0523   PHURINE 5.0 05/29/2017 0523   GLUCOSEU NEGATIVE 05/29/2017 0523   GLUCOSEU NEGATIVE 05/27/2007 0844   HGBUR NEGATIVE 05/29/2017 0523   HGBUR negative 05/05/2010 1041   BILIRUBINUR NEGATIVE 05/29/2017 0523   KETONESUR NEGATIVE 05/29/2017 0523   PROTEINUR 30 (A) 05/29/2017 0523   UROBILINOGEN 0.2 05/05/2010 1041   NITRITE NEGATIVE 05/29/2017 0523   LEUKOCYTESUR NEGATIVE 05/29/2017 0523    Radiological Exams on Admission: Dg Swallowing Func-speech Pathology  Result Date: 06/04/2017 Objective Swallowing Evaluation: Type of Study: MBS-Modified Barium Swallow Study  Patient Details Name: Luis Yu MRN: [id number] Date of Birth: Aug 03, 1945 Today's Date: 06/04/2017 Time: SLP Start Time (ACUTE ONLY): 1348 -SLP Stop Time (ACUTE ONLY): 1417 SLP Time Calculation (min) (ACUTE ONLY): 29 min Past Medical History: Past Medical History: Diagnosis Date . Adjustment disorder with mixed emotional features  . Atrial flutter (HCC)  . CAD (coronary artery disease)  . Depression  . Diabetes mellitus type II, uncontrolled (HCC)  . Diabetic peripheral neuropathy (HCC)  . Erectile dysfunction  . GERD (gastroesophageal reflux disease)  . Herpes zoster  . Hyperlipidemia  . Hypertension  . Obesity  . Osteoarthritis  . Postherpetic neuralgia  . Sleep apnea  Past Surgical History: Past Surgical History: Procedure Laterality Date . CARDIOVERSION N/A 11/10/2015  Procedure: CARDIOVERSION;  Surgeon: Laurey Morale, MD;  Location: [facility] ENDOSCOPY;  Service: Cardiovascular;  Laterality: N/A; . CARDIOVERSION N/A 02/21/2016  Procedure: CARDIOVERSION;  Surgeon: Lewayne Bunting, MD;  Location: [facility] ENDOSCOPY;  Service: Cardiovascular;  Laterality: N/A; . CORONARY ARTERY BYPASS  GRAFT  1996  eith a LIMA to the LAD, saphenous vein graft  to the acute marginal, saphenous vein graft to the PDA and saphenous vein graft to the circumflex. [name] KNEE ARTHROSCOPY  05/2004  right knee . REPLACEMENT TOTAL KNEE BILATERAL   . SHOULDER SURGERY   HPI: This is 72 year old white male who presents with fever and shaking along with facial swelling. It appears the patient had the dental abscess associated with cellulitis. He has been worked up extensively. Appears up blood cultures have not been positive. He has had x-B[place] showing the pneumonia presumably due to aspiration. Patient has been encephalopathic and agitated which resulted in the current consultation. He had a stroke about a year ago. The wife reports that he had significant right-sided weakness with a stroke and did have some residual problems from this. She tells me that he did have a footdrop on the right related to his stroke although he has had some neuropathy which may have contributed. Neuropathy is a more longstanding issue than the recent stroke a year ago. He is noted to have a mild dysarthria which wife tells me fluctuates and is probably his baseline. The patient's agitation have been occurring during the day and also at nighttime but may have been worse at nighttime and worse with of his CPAP machine while he has been hospitalized. He appears to have improved significantly this morning however. She reports that he is pretty close to baseline. The patient himself has little recollection of the events while he is hospitalized in the for canal provide any history. However, the wife does not report any clear focal new symptoms such is worsening dysarthria or worsening weakness on the right side. No left-sided weakness is reported. No visual changes are reported. The review of systems otherwise negative. Multiple infarcts both acute and chronic with increased risk of vascular dementia. Wife reports that he often became "choked  up" after eating or drinking. CT chest performed whichconfirmed evidence of aspiration pneumonia. Antibiotic coverage has been broadened to vancomycin and Zosyn. Speech therapy consulted for swallow evaluation.  Subjective: "You haven't given me anything to eat." (SLP just did BSE with foods) Assessment / Plan / Recommendation CHL IP CLINICAL IMPRESSIONS 06/04/2017 Clinical Impression Pt presents with moderate pharyngeal phase dysphagia characterized by delay in swallow initiation with swallow trigger after filling the valleculae, reduced hyolaryngeal excursion and epiglottic deflection resulting in penetration during and after the swallow with thins, nectars- trace, and slight with honey-thick liquids. Chin tuck was implemented with mod/max verbal and tactile cues and was consistently effective in preventing aspiration with thin liquids, however Pt unable to complete without cues due to cognitive status. Pt with trace aspiration with large cups sips and straw sips after the swallow from penetrant moving down anterior aspect of vocal folds; penetration occurred in greater amounts with straw sips thin and was not removed until SLP cued Pt for strong cough. Given current PNA and altered mental status, will change diet to nectar-thick liquids and continue regular textures with follow up dysphagia intervention at receiving facility. SLP has not been able to speak with Pt's wife yet, however chart review reveals that wife has noted frequent coughing with meals in the recent past. Prognosis to upgrade to thin liquids is good post discharge as Pt has a very strong and effective cough, but needs cues to do so. A chin tuck was effective with thin liquids and can be used if Pt able to successfully implement (he is not able to at this time due to cognitive status). Pharyngeal phase of swallow was WNL with  puree and regular textures. Recommend Regular textures and nectar-thick liquids with po meds whole one at a time with  NTL or in puree. OK for thin liquid trials with SLP with strict use of chin tuck. SLP Visit Diagnosis Dysphagia, oropharyngeal phase (R13.12) Attention and concentration deficit following -- Frontal lobe and executive function deficit following -- Impact on safety and function Moderate aspiration risk   CHL IP TREATMENT RECOMMENDATION 06/04/2017 Treatment Recommendations Therapy as outlined in treatment plan below   Prognosis 06/04/2017 Prognosis for Safe Diet Advancement Fair Barriers to Reach Goals Cognitive deficits Barriers/Prognosis Comment -- CHL IP DIET RECOMMENDATION 06/04/2017 SLP Diet Recommendations Regular solids;Nectar thick liquid Liquid Administration via Cup;No straw Medication Administration Whole meds with liquid Compensations Slow rate;Small sips/bites;Clear throat intermittently Postural Changes Seated upright at 90 degrees;Remain semi-upright after after feeds/meals (Comment)   CHL IP OTHER RECOMMENDATIONS 06/04/2017 Recommended Consults -- Oral Care Recommendations Oral care BID Other Recommendations Clarify dietary restrictions   CHL IP FOLLOW UP RECOMMENDATIONS 06/04/2017 Follow up Recommendations Skilled Nursing facility;Inpatient Rehab   CHL IP FREQUENCY AND DURATION 06/04/2017 Speech Therapy Frequency (ACUTE ONLY) min 2x/week Treatment Duration 1 week      CHL IP ORAL PHASE 06/04/2017 Oral Phase WFL Oral - Pudding Teaspoon -- Oral - Pudding Cup -- Oral - Honey Teaspoon -- Oral - Honey Cup -- Oral - Nectar Teaspoon -- Oral - Nectar Cup -- Oral - Nectar Straw -- Oral - Thin Teaspoon -- Oral - Thin Cup -- Oral - Thin Straw -- Oral - Puree -- Oral - Mech Soft -- Oral - Regular -- Oral - Multi-Consistency -- Oral - Pill -- Oral Phase - Comment --  CHL IP PHARYNGEAL PHASE 06/04/2017 Pharyngeal Phase Impaired Pharyngeal- Pudding Teaspoon -- Pharyngeal -- Pharyngeal- Pudding Cup -- Pharyngeal -- Pharyngeal- Honey Teaspoon -- Pharyngeal -- Pharyngeal- Honey Cup -- Pharyngeal -- Pharyngeal- Nectar Teaspoon  -- Pharyngeal -- Pharyngeal- Nectar Cup -- Pharyngeal Material does not enter airway;Material enters airway, remains ABOVE vocal cords then ejected out;Material enters airway, remains ABOVE vocal cords and not ejected out Pharyngeal- Nectar Straw -- Pharyngeal -- Pharyngeal- Thin Teaspoon Delayed swallow initiation-vallecula Pharyngeal -- Pharyngeal- Thin Cup Delayed swallow initiation-vallecula;Reduced epiglottic inversion;Reduced anterior laryngeal mobility;Reduced laryngeal elevation;Reduced airway/laryngeal closure;Penetration/Aspiration during swallow;Penetration/Apiration after swallow;Trace aspiration;Pharyngeal residue - valleculae Pharyngeal Material does not enter airway;Material enters airway, remains ABOVE vocal cords then ejected out;Material enters airway, passes BELOW cords then ejected out;Material enters airway, remains ABOVE vocal cords and not ejected out;Material enters airway, CONTACTS cords and then ejected out Pharyngeal- Thin Straw Delayed swallow initiation-pyriform sinuses;Reduced anterior laryngeal mobility;Reduced laryngeal elevation;Reduced airway/laryngeal closure;Penetration/Aspiration during swallow;Penetration/Apiration after swallow;Trace aspiration;Pharyngeal residue - valleculae;Pharyngeal residue - pyriform Pharyngeal Material enters airway, remains ABOVE vocal cords then ejected out;Material enters airway, CONTACTS cords and then ejected out;Material enters airway, CONTACTS cords and not ejected out;Material enters airway, passes BELOW cords then ejected out Pharyngeal- Puree WFL Pharyngeal -- Pharyngeal- Mechanical Soft -- Pharyngeal -- Pharyngeal- Regular WFL Pharyngeal -- Pharyngeal- Multi-consistency -- Pharyngeal -- Pharyngeal- Pill WFL Pharyngeal -- Pharyngeal Comment --  CHL IP CERVICAL ESOPHAGEAL PHASE 06/04/2017 Cervical Esophageal Phase WFL Pudding Teaspoon -- Pudding Cup -- Honey Teaspoon -- Honey Cup -- Nectar Teaspoon -- Nectar Cup -- Nectar Straw -- Thin Teaspoon --  Thin Cup -- Thin Straw -- Puree -- Mechanical Soft -- Regular -- Multi-consistency -- Pill -- Cervical Esophageal Comment -- No flowsheet data found. Thank you, Havery Moros, CCC-SLP [phone] PORTER,DABNEY 06/04/2017, 6:34 PM  EKG: Independently reviewed.  Atrial fibrillation with a left bundle branch block, no significant change from prior tracings  Assessment/Plan  1. Sepsis - secondary to acute right-sided dental infection.  Facial swelling improved.  CT confirms dental infection without any drainable abscess. He completed IV unasyn with improvement of right face swelling.  2. Aspiration pneumonia. IV unasyn completed after 7 days treatment.   Speech therapy recommending thickened liquids which patient has been refusing to try.  3. Delirium and sundowning - Pt had been started on seroquel 100 mg by neurology and started on aricept 5 mg daily for dementia.  4. Vascular dementia - patient has been started on aricept 5 mg daily by neurology.  5. Constipation - Pt finally had a bowel movement yesterday that I observed myself after suppository.  Continue laxatives.  6. Paroxysmal atrial fibrillation.  Chads vas 2 score of at least 3-  continue on rate control with nebivolol and anticoagulation with Eliquis. 7. Acute CVA - Completed stroke work up and had neurology consultation.   See neuro notes, will continue full anticoagulation with eliquis.   8. Chronic diastolic congestive heart failure.  EF on recent echo 55%.  Appears stable at this time.  Holding Lasix as he is not drinking well.  Weight appears to be near baseline.  Continue Imdur.  9. GERD.  Continue on PPI 10. Hyperlipidemia.  Continue on statin. 11. DM type II.  Lantus and supplemental sliding scale.  Blood sugars are currently stable.  Continue to monitor. 12. Hypertension.  Continue on Bystolic, losartan 13. Acute Encephalopathy - likely related to acute CVA. Neurology consult requested.    14. Urinary retention.   Patient required Foley catheter placement.  Flomax has been added. Discontinued foley 3/20, monitor for spontaneous voiding and catheterize if unable to void.  He has a condom catheter on.   CBG (last 3)  Recent Labs    06/05/17 1606 06/05/17 2104 06/06/17 0721  GLUCAP 133* 104* 131*   DVT prophylaxis: Eliquis Code Status: DNR Family Communication: wife present and updated at length Disposition Plan: when medically stabilized, planning for rehab placement Consults called: neurology  Critical Care Time Spent : 33 mins  Katrisha Segall M.D on 06/06/2017 at 10:47 AM  Between 7am to 7pm - Pager - [phone]

## 2017-06-06 NOTE — Progress Notes (Signed)
Pt AO3.minus situation.  4 point restraints removed and assessed. Pt ate breakfast and conversed with this nurse appropriately. Resting in bed calm watching tv. Restraints remain off at this time. Will monitor.

## 2017-06-06 NOTE — Progress Notes (Signed)
Pt is conversational and follows commands at this time but is still disoriented to all but "self" Patient is only oriented to self.

## 2017-06-06 NOTE — Progress Notes (Signed)
Patient continues to attempt to get out of bed and is confused, only alert to self (name)

## 2017-06-07 DIAGNOSIS — F0150 Vascular dementia without behavioral disturbance: Secondary | ICD-10-CM | POA: Diagnosis present

## 2017-06-07 LAB — GLUCOSE, CAPILLARY
Glucose-Capillary: 191 mg/dL — ABNORMAL HIGH (ref 65–99)
Glucose-Capillary: 227 mg/dL — ABNORMAL HIGH (ref 65–99)
Glucose-Capillary: 180 mg/dL — ABNORMAL HIGH (ref 65–99)
Glucose-Capillary: 221 mg/dL — ABNORMAL HIGH (ref 65–99)

## 2017-06-07 LAB — CREATININE, SERUM
Creatinine, Ser: 0.85 mg/dL (ref 0.61–1.24)
GFR calc Af Amer: >60 mL/min (ref >60)
GFR calc non Af Amer: >60 mL/min (ref >60)

## 2017-06-07 MED ORDER — APIXABAN 5 MG PO TABS
5.0000 mg | ORAL_TABLET | Freq: Two times a day (BID) | ORAL | Status: DC
  Administered 2017-06-07 – 2017-06-15 (×14): 5 mg via ORAL
  Filled 2017-06-07 (×16): qty 1

## 2017-06-07 NOTE — Progress Notes (Signed)
[place] A. Merlene Laughter, MD     [email or web address]          Luis Yu is an 72 y.o. male.   ASSESSMENT/PLAN: Resolving encephalopathy:  This is most likely multifactorial due to acute medical illness, the dental abscess with cellulitis and pneumonia.    Sundowning syndrome: The patient's Seroquel will be increased to 100 mg.  Embolic stroke in the setting of atrial fibrillation and acute illness with sepsis.  Agree with anticoagulation.  Multiple infarcts both acute and chronic with increased risk of vascular dementia: Aricept will be added at 5 mg. Switch from hs to AM.  This can be titrated to 10 mg a months from now.   Wife reports that he had a much better day today. He slept better last night also.   GENERAL:  He patient is receiving a nebulized treatment and does corporate with this although he is somewhat restless and trying to move around a bit.  HEENT:  He does appears to have mild dysarthria and mild excessive salivation.  He appears to be significantly hearing impaired.  EXTREMITIES: No edema   BACK: Normal alignment.  SKIN: Normal by inspection.    MENTAL STATUS:  He lays in bed with eyes closed but opens his eyes to verbal commands. She does knows that he is in the hospital.  Again there is mild dysarthria.  He does follow commands with prompting.  He has decent but reduced spontaneous speech.   COORDINATION: Left finger to nose is normal, right finger to nose is normal, No rest tremor; no intention tremor; no postural tremor; no bradykinesia.    Blood pressure 132/64, pulse 70, temperature 97.8 F (36.6 C), temperature source Oral, resp. rate (!) 25, height '6\' 3"'  (1.905 m), weight 204 lb 5.9 oz (92.7 kg), SpO2 (!) 88 %.  Past Medical History:  Diagnosis Date  . Adjustment disorder with mixed emotional features   . Atrial flutter ([place])   . CAD (coronary artery disease)   . Depression   . Diabetes mellitus type II, uncontrolled ([place])     . Diabetic peripheral neuropathy ([place])   . Erectile dysfunction   . GERD (gastroesophageal reflux disease)   . Herpes zoster   . Hyperlipidemia   . Hypertension   . Obesity   . Osteoarthritis   . Postherpetic neuralgia   . Sleep apnea     Past Surgical History:  Procedure Laterality Date  . CARDIOVERSION N/A 11/10/2015   Procedure: CARDIOVERSION;  Surgeon: Larey Dresser, MD;  Location: [facility];  Service: Cardiovascular;  Laterality: N/A;  . CARDIOVERSION N/A 02/21/2016   Procedure: CARDIOVERSION;  Surgeon: Lelon Perla, MD;  Location: [facility] ENDOSCOPY;  Service: Cardiovascular;  Laterality: N/A;  . CORONARY ARTERY BYPASS GRAFT  1996   eith a LIMA to the LAD, saphenous vein graft to the acute marginal, saphenous vein graft to the PDA and saphenous vein graft to the circumflex.  [name] KNEE ARTHROSCOPY  05/2004   right knee  . REPLACEMENT TOTAL KNEE BILATERAL    . SHOULDER SURGERY      Family History  Problem Relation Age of Onset  . Coronary artery disease Mother        deceased at 36  . Coronary artery disease Father        died age 90  . Stroke Unknown        half brother  . Stroke Brother     Social History:  reports that he has never  smoked. He has never used smokeless tobacco. He reports that he drinks about 1.2 oz of alcohol per week. He reports that he does not use drugs.  Allergies:  Allergies  Allergen Reactions  . Lorazepam Other (See Comments)    Makes more agitated and delirious    Medications: Prior to Admission medications   Medication Sig Start Date End Date Taking? Authorizing Provider  acetaminophen (TYLENOL) 500 MG tablet Take 2 tablets (1,000 mg total) by mouth every 6 (six) hours as needed. Patient taking differently: Take 1,000 mg by mouth every 6 (six) hours as needed for mild pain.  05/24/16  Yes Charlesetta Shanks, MD  apixaban (ELIQUIS) 5 MG TABS tablet Take 1 tablet (5 mg total) by mouth 2 (two) times daily. 08/09/16  Yes Nafziger, Tommi Rumps, NP   atorvastatin (LIPITOR) 80 MG tablet Take 1 tablet (80 mg total) by mouth daily. 04/03/17  Yes Nafziger, Tommi Rumps, NP  buPROPion (WELLBUTRIN SR) 150 MG 12 hr tablet Take 1 tablet (150 mg total) by mouth 2 (two) times daily. 04/02/17  Yes Nafziger, Tommi Rumps, NP  ferrous sulfate 325 (65 FE) MG tablet Take 325 mg by mouth every other day.   Yes [provider]  furosemide (LASIX) 20 MG tablet Take 2 tablets (40 mg total) by mouth daily. 04/24/17 07/23/17 Yes Lelon Perla, MD  glipiZIDE (GLUCOTROL) 10 MG tablet Take 10 mg by mouth daily. 07/14/16  Yes [provider]  Hypromellose (ARTIFICIAL TEARS OP) Apply 1 drop to eye every 2 (two) hours as needed ([place]).   Yes [provider]  insulin aspart (NOVOLOG) 100 UNIT/ML injection Inject 5-15 Units into the skin 3 (three) times daily before meals. Per sliding scale   Yes [provider]  Insulin Glargine (BASAGLAR KWIKPEN) 100 UNIT/ML SOPN Inject 0.25 mLs (25 Units total) into the skin at bedtime. Patient taking differently: Inject 20 Units into the skin at bedtime.  07/06/16  Yes Angiulli, Lavon Paganini, PA-C  isosorbide mononitrate (IMDUR) 30 MG 24 hr tablet Take 1 tablet (30 mg total) by mouth daily. 08/09/16  Yes Nafziger, Tommi Rumps, NP  losartan (COZAAR) 50 MG tablet Take 1 tablet (50 mg total) by mouth daily. 05/02/17 04/27/18 Yes Meng, Isaac Laud, PA  metFORMIN (GLUCOPHAGE) 1000 MG tablet TAKE 1 TABLET (1,000 MG TOTAL) BY MOUTH 2 (TWO) TIMES DAILY WITH A MEAL. 12/12/16  Yes Nafziger, Tommi Rumps, NP  mirtazapine (REMERON) 15 MG tablet Take 15 mg by mouth at bedtime.   Yes [provider]  Nebivolol HCl (BYSTOLIC) 20 MG TABS Take 1 tablet by mouth daily.   Yes [provider]  omeprazole (PRILOSEC) 20 MG capsule TAKE ONE CAPSULE TWICE A DAY BEFORE A MEAL 05/21/17  Yes Nafziger, Tommi Rumps, NP  penicillin v potassium (VEETID) 500 MG tablet Take 1,000 mg by mouth daily as needed (Swelling).    Yes [provider]  thiamine 100 MG  tablet Take 1 tablet (100 mg total) by mouth daily. 08/09/16  Yes Nafziger, Tommi Rumps, NP  vitamin B-12 (CYANOCOBALAMIN) 1000 MCG tablet Take 2 tablets (2,000 mcg total) by mouth daily. 08/09/16  Yes Nafziger, Tommi Rumps, NP    Scheduled Meds: . apixaban  5 mg Oral BID  . atorvastatin  80 mg Oral QHS  . buPROPion  150 mg Oral BID  . donepezil  5 mg Oral q morning - 10a  . feeding supplement (ENSURE ENLIVE)  237 mL Oral TID BM  . insulin aspart  0-15 Units Subcutaneous TID WC  . insulin glargine  10 Units Subcutaneous QHS  . ipratropium-albuterol  3 mL Nebulization BID  . isosorbide mononitrate  30 mg Oral Daily  . losartan  50 mg Oral Daily  . mirtazapine  15 mg Oral QHS  . multivitamin with minerals  1 tablet Oral Daily  . nebivolol  20 mg Oral QHS  . pantoprazole  40 mg Oral BID AC  . QUEtiapine  100 mg Oral QHS  . senna-docusate  2 tablet Oral BID  . tamsulosin  0.4 mg Oral Daily  . thiamine  100 mg Oral Daily  . vitamin B-12  2,000 mcg Oral QHS   Continuous Infusions:  PRN Meds:.acetaminophen **OR** acetaminophen, albuterol, food thickener, haloperidol lactate, [DISCONTINUED] ondansetron **OR** ondansetron (ZOFRAN) IV     Results for orders placed or performed during the hospital encounter of 05/29/17 (from the past 48 hour(s))  Glucose, capillary     Status: Abnormal   Collection Time: 06/05/17  9:04 PM  Result Value Ref Range   Glucose-Capillary 104 (H) 65 - 99 mg/dL   Comment 1 Notify RN   CBC     Status: Abnormal   Collection Time: 06/06/17  5:16 AM  Result Value Ref Range   WBC 7.7 4.0 - 10.5 K/uL   RBC 2.92 (L) 4.22 - 5.81 MIL/uL   Hemoglobin 8.8 (L) 13.0 - 17.0 g/dL   HCT 29.8 (L) 39.0 - 52.0 %   MCV 102.1 (H) 78.0 - 100.0 fL   MCH 30.1 26.0 - 34.0 pg   MCHC 29.5 (L) 30.0 - 36.0 g/dL   RDW 18.0 (H) 11.5 - 15.5 %   Platelets 163 150 - 400 K/uL    Comment: Performed at [facility], [address], [place], [place] [address]  Basic metabolic panel     Status: Abnormal    Collection Time: 06/06/17  5:16 AM  Result Value Ref Range   Sodium 143 135 - 145 mmol/L   Potassium 3.4 (L) 3.5 - 5.1 mmol/L   Chloride 107 101 - 111 mmol/L   CO2 26 22 - 32 mmol/L   Glucose, Bld 136 (H) 65 - 99 mg/dL   BUN 24 (H) 6 - 20 mg/dL   Creatinine, Ser 0.88 0.61 - 1.24 mg/dL   Calcium 8.4 (L) 8.9 - 10.3 mg/dL   GFR calc non Af Amer >60 >60 mL/min   GFR calc Af Amer >60 >60 mL/min    Comment: (NOTE) The eGFR has been calculated using the CKD EPI equation. This calculation has not been validated in all clinical situations. eGFR's persistently <60 mL/min signify possible Chronic Kidney Disease.    Anion gap 10 5 - 15    Comment: Performed at [facility], [address], [place], [place] [address]  Magnesium     Status: None   Collection Time: 06/06/17  5:16 AM  Result Value Ref Range   Magnesium 1.9 1.7 - 2.4 mg/dL    Comment: Performed at [facility], [address] [place], [place] [address]  Glucose, capillary     Status: Abnormal   Collection Time: 06/06/17  7:21 AM  Result Value Ref Range   Glucose-Capillary 131 (H) 65 - 99 mg/dL  Glucose, capillary     Status: Abnormal   Collection Time: 06/06/17 11:14 AM  Result Value Ref Range   Glucose-Capillary 116 (H) 65 - 99 mg/dL  Glucose, capillary     Status: Abnormal   Collection Time: 06/06/17  4:03 PM  Result Value Ref Range   Glucose-Capillary 190 (  H) 65 - 99 mg/dL  Glucose, capillary     Status: Abnormal   Collection Time: 06/06/17  9:09 PM  Result Value Ref Range   Glucose-Capillary 276 (H) 65 - 99 mg/dL   Comment 1 Notify RN   Creatinine, serum     Status: None   Collection Time: 06/07/17  4:35 AM  Result Value Ref Range   Creatinine, Ser 0.85 0.61 - 1.24 mg/dL   GFR calc non Af Amer >60 >60 mL/min   GFR calc Af Amer >60 >60 mL/min    Comment: (NOTE) The eGFR has been calculated using the CKD EPI equation. This calculation has not been validated in all clinical situations. eGFR's persistently <60 mL/min  signify possible Chronic Kidney Disease. Performed at [facility], [address], [place], [place] [address]   Glucose, capillary     Status: Abnormal   Collection Time: 06/07/17  7:23 AM  Result Value Ref Range   Glucose-Capillary 191 (H) 65 - 99 mg/dL  Glucose, capillary     Status: Abnormal   Collection Time: 06/07/17 12:27 PM  Result Value Ref Range   Glucose-Capillary 227 (H) 65 - 99 mg/dL  Glucose, capillary     Status: Abnormal   Collection Time: 06/07/17  4:22 PM  Result Value Ref Range   Glucose-Capillary 180 (H) 65 - 99 mg/dL    Studies/Results:  BRAIN MRI MRA CLINICAL DATA:  72 year old male with altered mental status and confusion for 2 days. Age indeterminate small vessel ischemia in the right external capsule on head CT yesterday.  EXAM: MRI HEAD WITHOUT CONTRAST  MRA HEAD WITHOUT CONTRAST  TECHNIQUE: Multiplanar, multiecho pulse sequences of the brain and surrounding structures were obtained without intravenous contrast. Angiographic images of the head were obtained using MRA technique without contrast.  COMPARISON:  Head CT without contrast 06/02/2017. Brain MRI and intracranial MRA 06/28/2016.  FINDINGS: MRI HEAD FINDINGS  The examination had to be discontinued prior to completion due to patient confusion. Axial and coronal diffusion weighted imaging, sagittal T1 and axial T2 weighted imaging only was obtained.  Brain: The posterior limb right internal capsule area of hypodensity seen by CT does correspond to an oval 11 millimeter area of restricted diffusion (series 3, image 87). There is associated T2 hyperintensity, with no mass effect or evidence of associated hemorrhage.  There is also a tiny additional subcortical white matter focus of restricted diffusion in the superolateral right occipital lobe bordering the posterior temporal lobe on series 3, image 85. No contralateral or posterior fossa restricted diffusion.  Stable  cerebral volume. Chronic lacunar infarcts in the left superior cerebellum, bilateral thalami, left corona radiata. No no midline shift, mass effect, evidence of mass lesion, ventriculomegaly, extra-axial collection or acute intracranial hemorrhage. Cervicomedullary junction and pituitary are within normal limits.  Vascular: Major intracranial vascular flow voids are stable since 2018.  Skull and upper cervical spine: Stable.  Sinuses/Orbits: Stable.  Other: Stable trace left mastoid fluid. Small volume retained secretions in the nasopharynx.  MRA HEAD FINDINGS  Stable antegrade flow in the posterior circulation. Chronic irregularity and mild stenosis in the right vertebral artery V4 segment. Both PICA origins remain patent. Negative distal left vertebral artery. Patent vertebrobasilar junction and basilar artery without stenosis. SCA and PCA origins remain normal. PCA branches are stable and within normal limits. Both posterior communicating arteries are present.  Stable antegrade flow in both ICA siphons. Mild siphon irregularity with no stenosis. Ophthalmic and posterior communicating artery origins remain normal. Patent  carotid termini. MCA and ACA origins remain normal. The left A1 segment is mildly dominant as before. The posterior communicating scratched at the anterior communicating artery is fenestrated, normal variant. Visible ACA branches are stable and within normal limits.  The left MCA M1 segment and bifurcation remain patent. There is mild irregularity and stenosis at the distal M1 and bifurcation, more affecting the anterior left M2 origin. Stable visible left MCA branches.  The right MCA M1 segment and trifurcation remain patent. There is mild irregularity and stenosis at the distal right M1 which is stable (series 103, image 11). The visible right MCA branches are stable.  IMPRESSION: 1. Two small acute lacunar infarcts in the right hemisphere,  the larger at the right external capsule corresponding to the indeterminate CT finding yesterday. No associated hemorrhage or mass effect. 2. No intracranial artery occlusion identified. Mild intracranial atherosclerosis and stenosis in both the anterior and posterior circulation appears stable since 06/28/2016. 3. The MRI portion of the exam had to be discontinued prior to completion due to patient confusion.    brain MRI is reviewed in person.there is a bright signal seen on DWI involving the posterior limb of internal capsule extending into the basal ganglia seen on a few cuts mild to moderate in size. This is on the right side. There is a fainter's tiny lesion also DWI involving right atria. MRA shows no large vessel occlusive disease.     Echocardiography: - Left ventricle: Difficult to assess LVEF even with Definity LVEF   is depressed at approximately 40% with basal inferior akinesis;   hypokinesis elsewhere. The cavity size was normal. Wall thickness   was increased in a pattern of moderate LVH. - Left atrium: The atrium was moderately to severely dilated. - Right ventricle: RV is mildly dilated and RVEF appeasrs mildly   reduced. - Right atrium: The atrium was mildly dilated.  Chris Cripps A. Merlene Laughter, M.D.  Diplomate, Tax adviser of Psychiatry and Neurology ( Neurology). 06/07/2017, 7:32 PM

## 2017-06-07 NOTE — Progress Notes (Signed)
Inpatient Diabetes Program Recommendations  AACE/ADA: New Consensus Statement on Inpatient Glycemic Control (2015)  Target Ranges:  Prepandial:   less than 140 mg/dL      Peak postprandial:   less than 180 mg/dL (1-2 hours)      Critically ill patients:  140 - 180 mg/dL   Results for Luis Yu, [name] (MRN [id number]) as of 06/07/2017 09:04  Ref. Range 06/06/2017 07:21 06/06/2017 11:14 06/06/2017 16:03 06/06/2017 21:09 06/07/2017 07:23  Glucose-Capillary Latest Ref Range: 65 - 99 mg/dL 213131 (H) 086116 (H) 578190 (H) 276 (H) 191 (H)   Review of Glycemic Control  Diabetes history: DM2 Outpatient Diabetes medications: Novolog 5-15 units TID  Current orders for Inpatient glycemic control: Lantus 10 units Q12H, Novolog 0-15 units TID with meals  Inpatient Diabetes Program Recommendations: Correction (SSI): Please consider ordering Novolog 0-5 units QHS for bedtime correction. Diet: If appropriate for patient, please consider changing diet from Regular to Carb Modified.  Thanks, Orlando PennerMarie Nayleah Gamel, RN, MSN, CDE Diabetes Coordinator Inpatient Diabetes Program [phone] (Team Pager from 8am to 5pm)

## 2017-06-07 NOTE — Progress Notes (Signed)
PROGRESS NOTE    Luis Yu  ZOX:[id number] DOB: [birth year] DOA: 05/29/2017 PCP: Shirline FreesNafziger, [name], NP    Brief Narrative:  72 year old male admitted to the hospital with fever, altered mental status and right sided dental infection.  Started on intravenous antibiotics.  Further workup with CT chest indicated evidence of aspiration pneumonia.  He was noted to have dysphasia requiring nectar thick liquids.  Patient was slowly improving with IV antibiotic therapy, but then had change in mental status.  Workup indicated new right-sided CVA.  He is also had difficulty with agitation and has underlying vascular dementia.  Neurology is following.  Patient became combative and required sedation.  Fortunately this affected his respiratory status necessitating BiPAP therapy.  Since that time, his overall mental status as well as respiratory status is improving.  Plan will be for skilled nursing facility placement once he is medically stable.   Assessment & Plan:   Principal Problem:   Acute CVA (cerebrovascular accident) (HCC) Active Problems:   DM (diabetes mellitus) type II uncontrolled with retinopathy   Hyperlipidemia   OSA (obstructive sleep apnea)   Essential hypertension   GERD   Atrial fibrillation (HCC)   Stroke (cerebrum) (HCC)   Coronary artery disease involving coronary bypass graft of native heart without angina pectoris   Hypertension   GERD (gastroesophageal reflux disease)   Diabetic peripheral neuropathy (HCC)   Diabetes mellitus type II, uncontrolled (HCC)   Dental infection   Chronic diastolic CHF (congestive heart failure) (HCC)   Aspiration pneumonia (HCC)   Protein-calorie malnutrition, severe   1. Sepsis.  Related to acute right-sided dental infection as well as aspiration pneumonia.  Improved with a course of intravenous antibiotics.  He is completed 7 days of IV Unasyn.  Cultures have been negative.  Hemodynamics are stable.  He is not having any fevers. 2. Right  sided dental infection.  CT scan did not indicate any drainable abscess.  Clinically has improved with IV antibiotics.  He will need to follow-up with his dentist. 3. Aspiration pneumonia.  Treated with 7 days of IV Unasyn.  Seen by speech therapy and recommend thickened liquids.  Patient has been refusing to comply with this. 4. Vascular dementia with agitation.  Started on Seroquel as well as Aricept.  Overall agitation appears to be improving.  Continue current treatments. 5. Acute CVA.  Patient had a change in mental status during his hospitalization.  CT head/MRI brain indicated new CVA.  Not a candidate for thrombolytics since he is chronically on anticoagulation.  Seen by neurology and has undergone stroke workup.  Recommendations are to continue current management and risk factor modification.  Stroke felt to be embolic in nature. 6. Paroxysmal atrial fibrillation.  Chads vas score of 3.  Continue rate control with metoprolol and anticoagulation with Eliquis. 7. Chronic diastolic congestive heart failure.  Ejection fraction of 55%.  Lasix currently on hold since p.o. intake has been poor.  Weight appears to be near baseline.  Continue to monitor. 8. GERD.  Continue on PPI 9. Hyperlipidemia.  Continue on statin. 10. Diabetes, type II.  Blood sugars are stable.  Continue on Lantus and sliding scale 11. Hypertension.  Continue on Bystolic and losartan. 12. Urinary retention.  Patient required Foley catheter placement.  Flomax was added and Foley catheter discontinued on 3/20.  Currently, he has a condom catheter.   DVT prophylaxis: Eliquis Code Status: DNR Family Communication: Discussed with wife at the bedside Disposition Plan: Skilled nursing facility placement on discharge.  Barriers at this time include high oxygen requirement and agitation.   Consultants:   Neurology  Speech therapy  Procedures:     Antimicrobials:   Completed 7 days of Unasyn   Subjective: Patient had a  better night.  Was less agitated.  Continues to cough.  Currently on modified diet.  Objective: Vitals:   06/07/17 1400 06/07/17 1500 06/07/17 1600 06/07/17 1700  BP: (!) 120/101 132/64    Pulse: 70 68 76 70  Resp: (!) 24 19 13  (!) 25  Temp:   97.8 F (36.6 C)   TempSrc:   Oral   SpO2: 99% 98% 95% (!) 88%  Weight:      Height:        Intake/Output Summary (Last 24 hours) at 06/07/2017 1759 Last data filed at 06/07/2017 1700 Gross per 24 hour  Intake 1920 ml  Output -  Net 1920 ml   Filed Weights   06/05/17 0500 06/06/17 0500 06/07/17 0500  Weight: 90.3 kg (199 lb 1.2 oz) 90.1 kg (198 lb 10.2 oz) 92.7 kg (204 lb 5.9 oz)    Examination:  General exam: Appears calm and comfortable  Respiratory system: Bilateral rhonchi. Respiratory effort normal. Cardiovascular system: S1 & S2 heard, RRR. No JVD, murmurs, rubs, gallops or clicks. No pedal edema. Gastrointestinal system: Abdomen is nondistended, soft and nontender. No organomegaly or masses felt. Normal bowel sounds heard. Central nervous system:  No focal neurological deficits. Extremities: Symmetric 5 x 5 power. Skin: No rashes, lesions or ulcers Psychiatry: Pleasantly confused.  He is not combative.    Data Reviewed: I have personally reviewed following labs and imaging studies  CBC: Recent Labs  Lab 06/02/17 0331 06/03/17 0730 06/04/17 0343 06/06/17 0516  WBC 8.5 8.7 9.2 7.7  HGB 9.3* 8.9* 9.1* 8.8*  HCT 31.0* 29.7* 29.7* 29.8*  MCV 101.6* 101.7* 101.0* 102.1*  PLT 173 156 152 163   Basic Metabolic Panel: Recent Labs  Lab 06/02/17 0331 06/03/17 0513 06/04/17 0343 06/05/17 0408 06/06/17 0516 06/07/17 0435  NA 144 142 143  --  143  --   K 3.4* 3.5 3.7  --  3.4*  --   CL 105 102 106  --  107  --   CO2 25 30 27   --  26  --   GLUCOSE 126* 143* 115*  --  136*  --   BUN 26* 23* 25*  --  24*  --   CREATININE 1.10 1.17 0.92 0.91 0.88 0.85  CALCIUM 9.0 8.6* 8.7*  --  8.4*  --   MG  --  1.9  --   --  1.9   --    GFR: Estimated Creatinine Clearance: 93.9 mL/min (by C-G formula based on SCr of 0.85 mg/dL). Liver Function Tests: No results for input(s): AST, ALT, ALKPHOS, BILITOT, PROT, ALBUMIN in the last 168 hours. No results for input(s): LIPASE, AMYLASE in the last 168 hours. No results for input(s): AMMONIA in the last 168 hours. Coagulation Profile: No results for input(s): INR, PROTIME in the last 168 hours. Cardiac Enzymes: No results for input(s): CKTOTAL, CKMB, CKMBINDEX, TROPONINI in the last 168 hours. BNP (last 3 results) No results for input(s): PROBNP in the last 8760 hours. HbA1C: No results for input(s): HGBA1C in the last 72 hours. CBG: Recent Labs  Lab 06/06/17 1603 06/06/17 2109 06/07/17 0723 06/07/17 1227 06/07/17 1622  GLUCAP 190* 276* 191* 227* 180*   Lipid Profile: No results for input(s): CHOL, HDL, LDLCALC, TRIG, CHOLHDL,  LDLDIRECT in the last 72 hours. Thyroid Function Tests: No results for input(s): TSH, T4TOTAL, FREET4, T3FREE, THYROIDAB in the last 72 hours. Anemia Panel: No results for input(s): VITAMINB12, FOLATE, FERRITIN, TIBC, IRON, RETICCTPCT in the last 72 hours. Sepsis Labs: No results for input(s): PROCALCITON, LATICACIDVEN in the last 168 hours.  Recent Results (from the past 240 hour(s))  Blood Culture (routine x 2)     Status: None   Collection Time: 05/29/17  5:39 AM  Result Value Ref Range Status   Specimen Description BLOOD RIGHT ARM  Final   Special Requests   Final    BOTTLES DRAWN AEROBIC AND ANAEROBIC Blood Culture adequate volume   Culture   Final    NO GROWTH 5 DAYS Performed at [facility], [address], [place], [place] [address]    Report Status 06/03/2017 FINAL  Final  Blood Culture (routine x 2)     Status: None   Collection Time: 05/29/17  5:41 AM  Result Value Ref Range Status   Specimen Description BLOOD LEFT ARM  Final   Special Requests   Final    BOTTLES DRAWN AEROBIC AND ANAEROBIC Blood Culture adequate  volume   Culture   Final    NO GROWTH 5 DAYS Performed at [facility], [address], [place], [place] [address]    Report Status 06/03/2017 FINAL  Final  Urine Culture     Status: None   Collection Time: 05/29/17  5:44 AM  Result Value Ref Range Status   Specimen Description   Final    URINE, CLEAN CATCH Performed at [facility], [address] [place], [place] [address]    Special Requests   Final    NONE Performed at [facility], [address] [place], [place] [address]    Culture   Final    NO GROWTH Performed at [facility], [address], [place], [place] [address]    Report Status 05/31/2017 FINAL  Final  MRSA PCR Screening     Status: None   Collection Time: 06/03/17  4:50 AM  Result Value Ref Range Status   MRSA by PCR NEGATIVE NEGATIVE Final    Comment:        The GeneXpert MRSA Assay (FDA approved for NASAL specimens only), is one component of a comprehensive MRSA colonization surveillance program. It is not intended to diagnose MRSA infection nor to guide or monitor treatment for MRSA infections. Performed at [facility], [address], [place], [place] [address]          Radiology Studies: No results found.      Scheduled Meds: . apixaban  5 mg Oral BID  . atorvastatin  80 mg Oral QHS  . buPROPion  150 mg Oral BID  . donepezil  5 mg Oral q morning - 10a  . feeding supplement (ENSURE ENLIVE)  237 mL Oral TID BM  . insulin aspart  0-15 Units Subcutaneous TID WC  . insulin glargine  10 Units Subcutaneous QHS  . ipratropium-albuterol  3 mL Nebulization BID  . isosorbide mononitrate  30 mg Oral Daily  . losartan  50 mg Oral Daily  . mirtazapine  15 mg Oral QHS  . multivitamin with minerals  1 tablet Oral Daily  . nebivolol  20 mg Oral QHS  . pantoprazole  40 mg Oral BID AC  . QUEtiapine  100 mg Oral QHS  . senna-docusate  2 tablet Oral BID  . tamsulosin  0.4 mg Oral  Daily  . thiamine  100 mg Oral Daily  . vitamin B-12  2,000  mcg Oral QHS   Continuous Infusions:   LOS: 8 days    Time spent:    Erick Blinks, MD [facility] Pager [phone]  If 7PM-7AM, please contact night-coverage [email or web address] Password [facility] 06/07/2017, 5:59 PM

## 2017-06-07 NOTE — Progress Notes (Signed)
Physical Therapy Treatment Patient Details Name: Luis ImusDonald W Daws MRN: [id number] DOB: 10/13/1945 Today's Date: 06/07/2017    History of Present Illness Luis Yu is a 72 y.o. male s/p acute CVA after admission with medical history significant of paroxysmal atrial fibrillation on Eliquis, chronic diastolic congestive heart failure, recurrent dental infections, presented to the hospital with fever.  Patient has had a prior stroke and has some residual cognitive effects.  History is mostly obtained from his wife.  She reports that he was in his usual state of health yesterday.  This morning he woke up confused, having chills.  He was noted to be trembling and shaking.  He began coughing.  No vomiting or diarrhea.  He was noted to be lethargic/confused.  He was brought to the emergency room for evaluation where he was noted to be mildly hypotensive/tachycardic.  He received IV fluids with correction of hemodynamics.  He was noted to be febrile with a temperature of 102.  He received Tylenol with improvement of fever.  Imaging has shown evidence of right sided dental infection.     PT Comments    Patient alert, behavior appropriate today and able to participate with therapy.   Patient demonstrates labored movement for sitting up at bedside, limited to a few side steps to transfer to chair, able to stand and march in place for up to 4-5 minutes before having to sit and demonstrates fair/good return for following instructions while completing BLE ROM/strengthening exercises.  Patient will benefit from continued physical therapy in hospital and recommended venue below to increase strength, balance, endurance for safe ADLs and gait.   Follow Up Recommendations  CIR;SNF     Equipment Recommendations  None recommended by PT    Recommendations for Other Services       Precautions / Restrictions Precautions Precautions: Fall Restrictions Weight Bearing Restrictions: No    Mobility  Bed  Mobility Overal bed mobility: Needs Assistance Bed Mobility: Supine to Sit Rolling: Min assist;Mod assist         General bed mobility comments: verbal/tactile cues to prop on elbows, tends to lean backwards  Transfers Overall transfer level: Needs assistance Equipment used: Rolling walker (2 wheeled) Transfers: Sit to/from UGI CorporationStand;Stand Pivot Transfers Sit to Stand: Mod assist;Min assist Stand pivot transfers: Mod assist       General transfer comment: tends to lean over RW  Ambulation/Gait Ambulation/Gait assistance: Mod assist;Max assist Ambulation Distance (Feet): 3 Feet Assistive device: Rolling walker (2 wheeled) Gait Pattern/deviations: Decreased step length - right;Decreased step length - left;Decreased stride length   Gait velocity interpretation: Below normal speed for age/gender General Gait Details: limited 4-5 unsteady side steps due to poor standing balance with leaning over RW and c/o fatigue   Stairs            Wheelchair Mobility    Modified Rankin (Stroke Patients Only)       Balance Overall balance assessment: Needs assistance Sitting-balance support: Feet supported;Bilateral upper extremity supported Sitting balance-Leahy Scale: Fair     Standing balance support: Bilateral upper extremity supported;During functional activity Standing balance-Leahy Scale: Poor Standing balance comment: fair/poor with RW                            Cognition Arousal/Alertness: Awake/alert Behavior During Therapy: WFL for tasks assessed/performed Overall Cognitive Status: Within Functional Limits for tasks assessed  Exercises General Exercises - Lower Extremity Long Arc Quad: Seated;AROM;Strengthening;Both;10 reps Hip Flexion/Marching: Seated;AROM;Strengthening;10 reps;Both Toe Raises: Seated;AROM;Strengthening;10 reps;Both Heel Raises: Seated;AROM;Strengthening;Both;10 reps Other  Exercises Other Exercises: standing march in place x 20 steps using RW    General Comments        Pertinent Vitals/Pain Pain Assessment: No/denies pain    Home Living                      Prior Function            PT Goals (current goals can now be found in the care plan section) Acute Rehab PT Goals Patient Stated Goal: return home PT Goal Formulation: With patient/family Time For Goal Achievement: 06/26/17 Potential to Achieve Goals: Good Progress towards PT goals: Progressing toward goals    Frequency    Min 6X/week      PT Plan Current plan remains appropriate    Co-evaluation              AM-PAC PT "6 Clicks" Daily Activity  Outcome Measure  Difficulty turning over in bed (including adjusting bedclothes, sheets and blankets)?: A Little Difficulty moving from lying on back to sitting on the side of the bed? : A Little Difficulty sitting down on and standing up from a chair with arms (e.g., wheelchair, bedside commode, etc,.)?: A Lot Help needed moving to and from a bed to chair (including a wheelchair)?: A Lot Help needed walking in hospital room?: A Lot Help needed climbing 3-5 steps with a railing? : Total 6 Click Score: 13    End of Session   Activity Tolerance: Patient tolerated treatment well;Patient limited by fatigue Patient left: in chair;with call bell/phone within reach;with family/visitor present Nurse Communication: Mobility status;Other (comment)(RN aware patient left up in chair) PT Visit Diagnosis: Unsteadiness on feet (R26.81);Other abnormalities of gait and mobility (R26.89);Muscle weakness (generalized) (M62.81)     Time: 6962-9528 PT Time Calculation (min) (ACUTE ONLY): 29 min  Charges:  $Therapeutic Exercise: 8-22 mins $Therapeutic Activity: 8-22 mins                    G Codes:       9:07 AM, 2017/06/22 Ocie Bob, MPT Physical Therapist with [facility] [phone] office [phone] mobile  phone

## 2017-06-07 NOTE — Progress Notes (Signed)
Speech Language Pathology Treatment: Dysphagia  Patient Details Name: RAJESH WYSS MRN: [id number] DOB: March 24, 1945 Today's Date: 06/07/2017 Time: 4098-1191 SLP Time Calculation (min) (ACUTE ONLY): 15 min  Assessment / Plan / Recommendation Clinical Impression  Dysphagia treatment provided for diet tolerance and compensatory strategy training. Per RN, pt's cognitive status has greatly improved. Pt tolerating regular foods/ nectar thick liquids without overt s/s of aspiration. Reviewed results of MBS (pt did not recall MBS occurring) and rationale for chin tuck strategy with thin liquids. Pt attempted and was able to complete correctly 3 out of 6 attempts with mod-max verbal and visual cues (tended to tilt head back first and then forward), resulting in immediate cough 2 out of 6 trials. Pt does seem motivated to make progress and suspect good prognosis given continued speech therapy at next level of care; would benefit from spaced retrieval training for utilizing chin tuck strategy/ throat clear strategy. May also benefit from repeat MBS in the near future given improved cognitive status. For now recommend continuing regular diet/ nectar thick liquids, meds whole in puree, full supervision to assist with feeding as needed, cue small bites/ sips and intermittent throat clear. Will continue to follow for duration of hospital stay.  HPI HPI: This is 72 year old white male who presents with fever and shaking along with facial swelling. It appears the patient had the dental abscess associated with cellulitis. He has been worked up extensively. Appears up blood cultures have not been positive. He has had x-[place] showing the pneumonia presumably due to aspiration. Patient has been encephalopathic and agitated which resulted in the current consultation. He had a stroke about a year ago. The wife reports that he had significant right-sided weakness with a stroke and did have some residual problems from this.  She tells me that he did have a footdrop on the right related to his stroke although he has had some neuropathy which may have contributed. Neuropathy is a more longstanding issue than the recent stroke a year ago. He is noted to have a mild dysarthria which wife tells me fluctuates and is probably his baseline. The patient's agitation have been occurring during the day and also at nighttime but may have been worse at nighttime and worse with of his CPAP machine while he has been hospitalized. He appears to have improved significantly this morning however. She reports that he is pretty close to baseline. The patient himself has little recollection of the events while he is hospitalized in the for canal provide any history. However, the wife does not report any clear focal new symptoms such is worsening dysarthria or worsening weakness on the right side. No left-sided weakness is reported. No visual changes are reported. The review of systems otherwise negative. Multiple infarcts both acute and chronic with increased risk of vascular dementia. Wife reports that he often became "choked up" after eating or drinking. CT chest performed whichconfirmed evidence of aspiration pneumonia. Antibiotic coverage has been broadened to vancomycin and Zosyn. Speech therapy consulted for swallow evaluation.      SLP Plan  Continue with current plan of care       Recommendations  Diet recommendations: Regular;Nectar-thick liquid Liquids provided via: Cup Medication Administration: Whole meds with liquid Supervision: Staff to assist with self feeding;Full supervision/cueing for compensatory strategies Compensations: Slow rate;Small sips/bites;Clear throat intermittently Postural Changes and/or Swallow Maneuvers: Seated upright 90 degrees                Oral Care Recommendations: Oral care BID  Follow up Recommendations: Inpatient Rehab;Skilled Nursing facility SLP Visit Diagnosis: Dysphagia,  unspecified (R13.10) Plan: Continue with current plan of care       GO                Metro KungAmy K Oleksiak, MA, CCC-SLP 06/07/2017, 6:44 PM

## 2017-06-07 NOTE — Progress Notes (Signed)
Valle A. Merlene Laughter, MD     [email or web address]          Luis Yu is an 72 y.o. male.   ASSESSMENT/PLAN: Resolving encephalopathy:  This is most likely multifactorial due to acute medical illness, the dental abscess with cellulitis and pneumonia.    Sundowning syndrome: The patient's Seroquel will be increased to 100 mg.  Embolic stroke in the setting of atrial fibrillation and acute illness with sepsis.  Agree with anticoagulation.  Multiple infarcts both acute and chronic with increased risk of vascular dementia: Aricept will be added at 5 mg. Switch from hs to AM.  This can be titrated to 10 mg a months from now.   It appears that the patient did have agitation last night but not as the peers night.  There appears to be improvement.  The wife reports that he has a longstanding history of insomnia.  We will try switching the patient's Aricept for a takes in the morning time.  Will continue with current dose of Seroquel.     GENERAL:  He patient is receiving a nebulized treatment and does corporate with this although he is somewhat restless and trying to move around a bit.  HEENT:  He does appears to have mild dysarthria and mild excessive salivation.  He appears to be significantly hearing impaired.  ABDOMEN: Soft  EXTREMITIES: No edema   BACK: Normal alignment.  SKIN: Normal by inspection.    MENTAL STATUS:  He lays in bed with eyes closed but opens his eyes to verbal commands. She does knows that he is in the hospital.  Again there is mild dysarthria.  He does follow commands with prompting.  He has decent but reduced spontaneous speech.  CRANIAL NERVES: Pupils are equal, round and reactive to light and accommodation; extraocular movements are full, there is no significant nystagmus; upper and lower facial muscles are normal in strength and symmetric, there is no flattening of the nasolabial folds; tongue is midline; uvula is midline; shoulder  elevation is normal.  Visual fields are intact.  MOTOR:  Right dorsiflexion is 4/5.  Right hip flexion 5. Right upper extremity 5.  Left side shows normal tone, bulk and strength.  COORDINATION: Left finger to nose is normal, right finger to nose is normal, No rest tremor; no intention tremor; no postural tremor; no bradykinesia.   SENSATION: Normal to light touch and temperature.       Blood pressure 133/77, pulse 67, temperature 98.7 F (37.1 C), temperature source Oral, resp. rate (!) 29, height '6\' 3"'  (1.905 m), weight 204 lb 5.9 oz (92.7 kg), SpO2 100 %.  Past Medical History:  Diagnosis Date  . Adjustment disorder with mixed emotional features   . Atrial flutter ([place])   . CAD (coronary artery disease)   . Depression   . Diabetes mellitus type II, uncontrolled ([place])   . Diabetic peripheral neuropathy ([place])   . Erectile dysfunction   . GERD (gastroesophageal reflux disease)   . Herpes zoster   . Hyperlipidemia   . Hypertension   . Obesity   . Osteoarthritis   . Postherpetic neuralgia   . Sleep apnea     Past Surgical History:  Procedure Laterality Date  . CARDIOVERSION N/A 11/10/2015   Procedure: CARDIOVERSION;  Surgeon: Larey Dresser, MD;  Location: [facility] ENDOSCOPY;  Service: Cardiovascular;  Laterality: N/A;  . CARDIOVERSION N/A 02/21/2016   Procedure: CARDIOVERSION;  Surgeon: Lelon Perla, MD;  Location: [facility];  Service:  Cardiovascular;  Laterality: N/A;  . CORONARY ARTERY BYPASS GRAFT  1996   eith a LIMA to the LAD, saphenous vein graft to the acute marginal, saphenous vein graft to the PDA and saphenous vein graft to the circumflex.  [name] KNEE ARTHROSCOPY  05/2004   right knee  . REPLACEMENT TOTAL KNEE BILATERAL    . SHOULDER SURGERY      Family History  Problem Relation Age of Onset  . Coronary artery disease Mother        deceased at 61  . Coronary artery disease Father        died age 53  . Stroke Unknown        half brother  . Stroke Brother       Social History:  reports that he has never smoked. He has never used smokeless tobacco. He reports that he drinks about 1.2 oz of alcohol per week. He reports that he does not use drugs.  Allergies:  Allergies  Allergen Reactions  . Lorazepam Other (See Comments)    Makes more agitated and delirious    Medications: Prior to Admission medications   Medication Sig Start Date End Date Taking? Authorizing Provider  acetaminophen (TYLENOL) 500 MG tablet Take 2 tablets (1,000 mg total) by mouth every 6 (six) hours as needed. Patient taking differently: Take 1,000 mg by mouth every 6 (six) hours as needed for mild pain.  05/24/16  Yes Charlesetta Shanks, MD  apixaban (ELIQUIS) 5 MG TABS tablet Take 1 tablet (5 mg total) by mouth 2 (two) times daily. 08/09/16  Yes Nafziger, Tommi Rumps, NP  atorvastatin (LIPITOR) 80 MG tablet Take 1 tablet (80 mg total) by mouth daily. 04/03/17  Yes Nafziger, Tommi Rumps, NP  buPROPion (WELLBUTRIN SR) 150 MG 12 hr tablet Take 1 tablet (150 mg total) by mouth 2 (two) times daily. 04/02/17  Yes Nafziger, Tommi Rumps, NP  ferrous sulfate 325 (65 FE) MG tablet Take 325 mg by mouth every other day.   Yes [provider]  furosemide (LASIX) 20 MG tablet Take 2 tablets (40 mg total) by mouth daily. 04/24/17 07/23/17 Yes Lelon Perla, MD  glipiZIDE (GLUCOTROL) 10 MG tablet Take 10 mg by mouth daily. 07/14/16  Yes [provider]  Hypromellose (ARTIFICIAL TEARS OP) Apply 1 drop to eye every 2 (two) hours as needed ([place]).   Yes [provider]  insulin aspart (NOVOLOG) 100 UNIT/ML injection Inject 5-15 Units into the skin 3 (three) times daily before meals. Per sliding scale   Yes [provider]  Insulin Glargine (BASAGLAR KWIKPEN) 100 UNIT/ML SOPN Inject 0.25 mLs (25 Units total) into the skin at bedtime. Patient taking differently: Inject 20 Units into the skin at bedtime.  07/06/16  Yes Angiulli, Lavon Paganini, PA-C  isosorbide mononitrate (IMDUR) 30 MG 24 hr  tablet Take 1 tablet (30 mg total) by mouth daily. 08/09/16  Yes Nafziger, Tommi Rumps, NP  losartan (COZAAR) 50 MG tablet Take 1 tablet (50 mg total) by mouth daily. 05/02/17 04/27/18 Yes Meng, Isaac Laud, PA  metFORMIN (GLUCOPHAGE) 1000 MG tablet TAKE 1 TABLET (1,000 MG TOTAL) BY MOUTH 2 (TWO) TIMES DAILY WITH A MEAL. 12/12/16  Yes Nafziger, Tommi Rumps, NP  mirtazapine (REMERON) 15 MG tablet Take 15 mg by mouth at bedtime.   Yes [provider]  Nebivolol HCl (BYSTOLIC) 20 MG TABS Take 1 tablet by mouth daily.   Yes [provider]  omeprazole (PRILOSEC) 20 MG capsule TAKE ONE CAPSULE TWICE A DAY BEFORE A MEAL 05/21/17  Yes Nafziger, Tommi Rumps, NP  penicillin v potassium (VEETID) 500 MG tablet Take 1,000 mg by mouth daily as needed (Swelling).    Yes [provider]  thiamine 100 MG tablet Take 1 tablet (100 mg total) by mouth daily. 08/09/16  Yes Nafziger, Tommi Rumps, NP  vitamin B-12 (CYANOCOBALAMIN) 1000 MCG tablet Take 2 tablets (2,000 mcg total) by mouth daily. 08/09/16  Yes Nafziger, Tommi Rumps, NP    Scheduled Meds: . apixaban  5 mg Oral BID  . atorvastatin  80 mg Oral QHS  . buPROPion  150 mg Oral BID  . donepezil  5 mg Oral q morning - 10a  . feeding supplement (ENSURE ENLIVE)  237 mL Oral TID BM  . insulin aspart  0-15 Units Subcutaneous TID WC  . insulin glargine  10 Units Subcutaneous QHS  . ipratropium-albuterol  3 mL Nebulization BID  . isosorbide mononitrate  30 mg Oral Daily  . losartan  50 mg Oral Daily  . mirtazapine  15 mg Oral QHS  . multivitamin with minerals  1 tablet Oral Daily  . nebivolol  20 mg Oral QHS  . pantoprazole  40 mg Oral BID AC  . QUEtiapine  100 mg Oral QHS  . senna-docusate  2 tablet Oral BID  . tamsulosin  0.4 mg Oral Daily  . thiamine  100 mg Oral Daily  . vitamin B-12  2,000 mcg Oral QHS   Continuous Infusions:  PRN Meds:.acetaminophen **OR** acetaminophen, albuterol, food thickener, haloperidol lactate, [DISCONTINUED] ondansetron **OR** ondansetron (ZOFRAN)  IV     Results for orders placed or performed during the hospital encounter of 05/29/17 (from the past 48 hour(s))  Glucose, capillary     Status: Abnormal   Collection Time: 06/05/17 11:09 AM  Result Value Ref Range   Glucose-Capillary 124 (H) 65 - 99 mg/dL  Glucose, capillary     Status: Abnormal   Collection Time: 06/05/17  4:06 PM  Result Value Ref Range   Glucose-Capillary 133 (H) 65 - 99 mg/dL  Glucose, capillary     Status: Abnormal   Collection Time: 06/05/17  9:04 PM  Result Value Ref Range   Glucose-Capillary 104 (H) 65 - 99 mg/dL   Comment 1 Notify RN   CBC     Status: Abnormal   Collection Time: 06/06/17  5:16 AM  Result Value Ref Range   WBC 7.7 4.0 - 10.5 K/uL   RBC 2.92 (L) 4.22 - 5.81 MIL/uL   Hemoglobin 8.8 (L) 13.0 - 17.0 g/dL   HCT 29.8 (L) 39.0 - 52.0 %   MCV 102.1 (H) 78.0 - 100.0 fL   MCH 30.1 26.0 - 34.0 pg   MCHC 29.5 (L) 30.0 - 36.0 g/dL   RDW 18.0 (H) 11.5 - 15.5 %   Platelets 163 150 - 400 K/uL    Comment: Performed at [facility], [address] [place], [place] [address]  Basic metabolic panel     Status: Abnormal   Collection Time: 06/06/17  5:16 AM  Result Value Ref Range   Sodium 143 135 - 145 mmol/L   Potassium 3.4 (L) 3.5 - 5.1 mmol/L   Chloride 107 101 - 111 mmol/L   CO2 26 22 - 32 mmol/L   Glucose, Bld 136 (H) 65 - 99 mg/dL   BUN 24 (H) 6 - 20 mg/dL   Creatinine, Ser 0.88 0.61 - 1.24 mg/dL   Calcium 8.4 (L) 8.9 - 10.3 mg/dL   GFR calc non Af Amer >60 >60 mL/min  GFR calc Af Amer >60 >60 mL/min    Comment: (NOTE) The eGFR has been calculated using the CKD EPI equation. This calculation has not been validated in all clinical situations. eGFR's persistently <60 mL/min signify possible Chronic Kidney Disease.    Anion gap 10 5 - 15    Comment: Performed at [facility], [address], [place], [place] [address]  Magnesium     Status: None   Collection Time: 06/06/17  5:16 AM  Result Value Ref Range   Magnesium 1.9 1.7 - 2.4  mg/dL    Comment: Performed at [facility], [address], [place], [place] [address]  Glucose, capillary     Status: Abnormal   Collection Time: 06/06/17  7:21 AM  Result Value Ref Range   Glucose-Capillary 131 (H) 65 - 99 mg/dL  Glucose, capillary     Status: Abnormal   Collection Time: 06/06/17 11:14 AM  Result Value Ref Range   Glucose-Capillary 116 (H) 65 - 99 mg/dL  Glucose, capillary     Status: Abnormal   Collection Time: 06/06/17  4:03 PM  Result Value Ref Range   Glucose-Capillary 190 (H) 65 - 99 mg/dL  Glucose, capillary     Status: Abnormal   Collection Time: 06/06/17  9:09 PM  Result Value Ref Range   Glucose-Capillary 276 (H) 65 - 99 mg/dL   Comment 1 Notify RN   Creatinine, serum     Status: None   Collection Time: 06/07/17  4:35 AM  Result Value Ref Range   Creatinine, Ser 0.85 0.61 - 1.24 mg/dL   GFR calc non Af Amer >60 >60 mL/min   GFR calc Af Amer >60 >60 mL/min    Comment: (NOTE) The eGFR has been calculated using the CKD EPI equation. This calculation has not been validated in all clinical situations. eGFR's persistently <60 mL/min signify possible Chronic Kidney Disease. Performed at [facility], [address], [place], [place] [address]   Glucose, capillary     Status: Abnormal   Collection Time: 06/07/17  7:23 AM  Result Value Ref Range   Glucose-Capillary 191 (H) 65 - 99 mg/dL    Studies/Results:  BRAIN MRI MRA CLINICAL DATA:  72 year old male with altered mental status and confusion for 2 days. Age indeterminate small vessel ischemia in the right external capsule on head CT yesterday.  EXAM: MRI HEAD WITHOUT CONTRAST  MRA HEAD WITHOUT CONTRAST  TECHNIQUE: Multiplanar, multiecho pulse sequences of the brain and surrounding structures were obtained without intravenous contrast. Angiographic images of the head were obtained using MRA technique without contrast.  COMPARISON:  Head CT without contrast 06/02/2017. Brain MRI  and intracranial MRA 06/28/2016.  FINDINGS: MRI HEAD FINDINGS  The examination had to be discontinued prior to completion due to patient confusion. Axial and coronal diffusion weighted imaging, sagittal T1 and axial T2 weighted imaging only was obtained.  Brain: The posterior limb right internal capsule area of hypodensity seen by CT does correspond to an oval 11 millimeter area of restricted diffusion (series 3, image 87). There is associated T2 hyperintensity, with no mass effect or evidence of associated hemorrhage.  There is also a tiny additional subcortical white matter focus of restricted diffusion in the superolateral right occipital lobe bordering the posterior temporal lobe on series 3, image 85. No contralateral or posterior fossa restricted diffusion.  Stable cerebral volume. Chronic lacunar infarcts in the left superior cerebellum, bilateral thalami, left corona radiata. No no midline shift, mass effect, evidence of mass lesion,  ventriculomegaly, extra-axial collection or acute intracranial hemorrhage. Cervicomedullary junction and pituitary are within normal limits.  Vascular: Major intracranial vascular flow voids are stable since 2018.  Skull and upper cervical spine: Stable.  Sinuses/Orbits: Stable.  Other: Stable trace left mastoid fluid. Small volume retained secretions in the nasopharynx.  MRA HEAD FINDINGS  Stable antegrade flow in the posterior circulation. Chronic irregularity and mild stenosis in the right vertebral artery V4 segment. Both PICA origins remain patent. Negative distal left vertebral artery. Patent vertebrobasilar junction and basilar artery without stenosis. SCA and PCA origins remain normal. PCA branches are stable and within normal limits. Both posterior communicating arteries are present.  Stable antegrade flow in both ICA siphons. Mild siphon irregularity with no stenosis. Ophthalmic and posterior communicating  artery origins remain normal. Patent carotid termini. MCA and ACA origins remain normal. The left A1 segment is mildly dominant as before. The posterior communicating scratched at the anterior communicating artery is fenestrated, normal variant. Visible ACA branches are stable and within normal limits.  The left MCA M1 segment and bifurcation remain patent. There is mild irregularity and stenosis at the distal M1 and bifurcation, more affecting the anterior left M2 origin. Stable visible left MCA branches.  The right MCA M1 segment and trifurcation remain patent. There is mild irregularity and stenosis at the distal right M1 which is stable (series 103, image 11). The visible right MCA branches are stable.  IMPRESSION: 1. Two small acute lacunar infarcts in the right hemisphere, the larger at the right external capsule corresponding to the indeterminate CT finding yesterday. No associated hemorrhage or mass effect. 2. No intracranial artery occlusion identified. Mild intracranial atherosclerosis and stenosis in both the anterior and posterior circulation appears stable since 06/28/2016. 3. The MRI portion of the exam had to be discontinued prior to completion due to patient confusion.    brain MRI is reviewed in person.there is a bright signal seen on DWI involving the posterior limb of internal capsule extending into the basal ganglia seen on a few cuts mild to moderate in size. This is on the right side. There is a fainter's tiny lesion also DWI involving right atria. MRA shows no large vessel occlusive disease.     Echocardiography: - Left ventricle: Difficult to assess LVEF even with Definity LVEF   is depressed at approximately 40% with basal inferior akinesis;   hypokinesis elsewhere. The cavity size was normal. Wall thickness   was increased in a pattern of moderate LVH. - Left atrium: The atrium was moderately to severely dilated. - Right ventricle: RV is mildly  dilated and RVEF appeasrs mildly   reduced. - Right atrium: The atrium was mildly dilated.  Bracen Schum A. Merlene Laughter, M.D.  Diplomate, Tax adviser of Psychiatry and Neurology ( Neurology). 06/07/2017, 10:30 AM

## 2017-06-07 NOTE — Progress Notes (Signed)
OT Cancellation Note  Patient Details Name: Doretha ImusDonald W Knipp MRN: [id number] DOB: December 26, 1945   Cancelled Treatment:    Reason Eval/Treat Not Completed: Other (comment). Pt actively working with PT on OT arrival. Will check back at a later time.    Ezra SitesLeslie Neddie Steedman, OTR/L  [phone] 06/07/2017, 8:45 AM

## 2017-06-08 LAB — GLUCOSE, CAPILLARY
Glucose-Capillary: 182 mg/dL — ABNORMAL HIGH (ref 65–99)
Glucose-Capillary: 209 mg/dL — ABNORMAL HIGH (ref 65–99)

## 2017-06-08 LAB — BASIC METABOLIC PANEL
Anion gap: 8 (ref 5–15)
BUN: 28 mg/dL — ABNORMAL HIGH (ref 6–20)
CO2: 28 mmol/L (ref 22–32)
Calcium: 8.4 mg/dL — ABNORMAL LOW (ref 8.9–10.3)
Chloride: 108 mmol/L (ref 101–111)
Creatinine, Ser: 0.87 mg/dL (ref 0.61–1.24)
GFR calc Af Amer: >60 mL/min (ref >60)
GFR calc non Af Amer: >60 mL/min (ref >60)
Glucose, Bld: 170 mg/dL — ABNORMAL HIGH (ref 65–99)
Potassium: 3.4 mmol/L — ABNORMAL LOW (ref 3.5–5.1)
Sodium: 144 mmol/L (ref 135–145)

## 2017-06-08 LAB — CBC
HCT: 29.8 % — ABNORMAL LOW (ref 39.0–52.0)
Hemoglobin: 8.7 g/dL — ABNORMAL LOW (ref 13.0–17.0)
MCH: 29.9 pg (ref 26.0–34.0)
MCHC: 29.2 g/dL — ABNORMAL LOW (ref 30.0–36.0)
MCV: 102.4 fL — ABNORMAL HIGH (ref 78.0–100.0)
Platelets: 171 10*3/uL (ref 150–400)
RBC: 2.91 MIL/uL — ABNORMAL LOW (ref 4.22–5.81)
RDW: 17.6 % — ABNORMAL HIGH (ref 11.5–15.5)
WBC: 8 10*3/uL (ref 4.0–10.5)

## 2017-06-08 NOTE — Progress Notes (Signed)
Patient has had no obvious or stated distress overnight. No agitation No aggression Easily reoriented immediately after waking, otherwise shows no difficulty or distress. Patient is pleasant and conversational. Patient was able to walk to toilet in room however highly dependant on assistance for remaining upright.

## 2017-06-08 NOTE — Progress Notes (Signed)
PROGRESS NOTE    Luis Yu  ZOX:[id number] DOB: June 19, 1945 DOA: 05/29/2017 PCP: Shirline Frees, NP    Brief Narrative:  72 year old male admitted to the hospital with fever, altered mental status and right sided dental infection.  Started on intravenous antibiotics.  Further workup with CT chest indicated evidence of aspiration pneumonia.  He was noted to have dysphasia requiring nectar thick liquids.  Patient was slowly improving with IV antibiotic therapy, but then had change in mental status.  Workup indicated new right-sided CVA.  He is also had difficulty with agitation and has underlying vascular dementia.  Neurology is following.  Patient became combative and required sedation.  Fortunately this affected his respiratory status necessitating BiPAP therapy.  Since that time, his overall mental status as well as respiratory status is improving.  Plan will be for skilled nursing facility placement once he is medically stable.   Assessment & Plan:   Principal Problem:   Acute CVA (cerebrovascular accident) (HCC) Active Problems:   DM (diabetes mellitus) type II uncontrolled with retinopathy   Hyperlipidemia   OSA (obstructive sleep apnea)   Essential hypertension   GERD   Atrial fibrillation (HCC)   Stroke (cerebrum) (HCC)   Coronary artery disease involving coronary bypass graft of native heart without angina pectoris   Hypertension   GERD (gastroesophageal reflux disease)   Diabetic peripheral neuropathy (HCC)   Diabetes mellitus type II, uncontrolled (HCC)   Dental infection   Chronic diastolic CHF (congestive heart failure) (HCC)   Aspiration pneumonia (HCC)   Protein-calorie malnutrition, severe   Vascular dementia   1. Sepsis.  Related to acute right-sided dental infection as well as aspiration pneumonia.  Improved with a course of intravenous antibiotics.  He is completed 7 days of IV Unasyn.  Cultures have been negative.  Hemodynamics are stable.  He is not having any  fevers. 2. Right sided dental infection.  CT scan did not indicate any drainable abscess.  Clinically has improved with IV antibiotics.  He will need to follow-up with his dentist. 3. Aspiration pneumonia.  Treated with 7 days of IV Unasyn.  Seen by speech therapy and recommend nectar thickened liquids.  Patient has been refusing to comply with this. 4. Vascular dementia with agitation.  Started on Seroquel as well as Aricept.  Overall agitation appears to be improving.  Continue current treatments. 5. Acute CVA.  Patient had a change in mental status during his hospitalization.  CT head/MRI brain indicated 2 small acute lacunar infarcts in the right hemisphere.  Not a candidate for thrombolytics since he is chronically on anticoagulation.  Seen by neurology and he has undergone stroke workup.  Echocardiogram unremarkable.  Carotid Dopplers show incidental finding of moderate left internal carotid artery stenosis (50-69%).  This will need to be followed up as an outpatient.  Recommendations are to continue current management and risk factor modification.  Stroke felt to be embolic in nature. 6. Paroxysmal atrial fibrillation.  Chads vas score of 3.  Heart rate is stable on beta blockers. Anticoagulated with eliquis 7. Chronic diastolic congestive heart failure.  Ejection fraction of 55%.  Lasix currently on hold since p.o. intake has been poor.  Weight appears to be near baseline.  Continue to monitor. 8. GERD.  Continue on PPI 9. Hyperlipidemia.  Continue on statin. 10. Diabetes, type II.  Blood sugars are stable.  Continue on Lantus and sliding scale 11. Hypertension.  Continue on Bystolic and losartan. 12. Urinary retention.  Patient required Foley catheter  placement.  Flomax was added and Foley catheter discontinued on 3/20.  Currently, he has a condom catheter and is voiding.   DVT prophylaxis: Eliquis Code Status: DNR Family Communication: Discussed with wife at the bedside Disposition Plan:  Skilled nursing facility placement on discharge.  Barriers at this time include high oxygen requirement and agitation. Oxygen is being weaned off and agitation improving. Possible discharge on Monday.   Consultants:   Neurology  Speech therapy  Procedures:     Antimicrobials:   Completed 7 days of Unasyn   Subjective: Wife reports that he was restless overnight. No shortness of breath or cough  Objective: Vitals:   06/08/17 0500 06/08/17 0600 06/08/17 0700 06/08/17 0838  BP: (!) 147/88 132/86 (!) 95/59   Pulse: 72 69 66   Resp: 13 (!) 25 (!) 21   Temp: 97.7 F (36.5 C)     TempSrc: Oral     SpO2: 94% 100% 96% 100%  Weight: 93 kg (205 lb 0.4 oz)     Height:        Intake/Output Summary (Last 24 hours) at 06/08/2017 0846 Last data filed at 06/08/2017 0530 Gross per 24 hour  Intake 1440 ml  Output 410 ml  Net 1030 ml   Filed Weights   06/06/17 0500 06/07/17 0500 06/08/17 0500  Weight: 90.1 kg (198 lb 10.2 oz) 92.7 kg (204 lb 5.9 oz) 93 kg (205 lb 0.4 oz)    Examination:  General exam: no distress Respiratory system: Clear to auscultation. Respiratory effort normal. Cardiovascular system:RRR. No murmurs, rubs, gallops. Gastrointestinal system: Abdomen is nondistended, soft and nontender. No organomegaly or masses felt. Normal bowel sounds heard. Central nervous system: No focal neurological deficits. Extremities: No C/C/E, +pedal pulses Skin: No rashes, lesions or ulcers Psychiatry: calm, cooperative  Data Reviewed: I have personally reviewed following labs and imaging studies  CBC: Recent Labs  Lab 06/02/17 0331 06/03/17 0730 06/04/17 0343 06/06/17 0516 06/08/17 0450  WBC 8.5 8.7 9.2 7.7 8.0  HGB 9.3* 8.9* 9.1* 8.8* 8.7*  HCT 31.0* 29.7* 29.7* 29.8* 29.8*  MCV 101.6* 101.7* 101.0* 102.1* 102.4*  PLT 173 156 152 163 171   Basic Metabolic Panel: Recent Labs  Lab 06/02/17 0331 06/03/17 0513 06/04/17 0343 06/05/17 0408 06/06/17 0516  06/07/17 0435 06/08/17 0450  NA 144 142 143  --  143  --  144  K 3.4* 3.5 3.7  --  3.4*  --  3.4*  CL 105 102 106  --  107  --  108  CO2 25 30 27   --  26  --  28  GLUCOSE 126* 143* 115*  --  136*  --  170*  BUN 26* 23* 25*  --  24*  --  28*  CREATININE 1.10 1.17 0.92 0.91 0.88 0.85 0.87  CALCIUM 9.0 8.6* 8.7*  --  8.4*  --  8.4*  MG  --  1.9  --   --  1.9  --   --    GFR: Estimated Creatinine Clearance: 91.7 mL/min (by C-G formula based on SCr of 0.87 mg/dL). Liver Function Tests: No results for input(s): AST, ALT, ALKPHOS, BILITOT, PROT, ALBUMIN in the last 168 hours. No results for input(s): LIPASE, AMYLASE in the last 168 hours. No results for input(s): AMMONIA in the last 168 hours. Coagulation Profile: No results for input(s): INR, PROTIME in the last 168 hours. Cardiac Enzymes: No results for input(s): CKTOTAL, CKMB, CKMBINDEX, TROPONINI in the last 168 hours. BNP (last 3  results) No results for input(s): PROBNP in the last 8760 hours. HbA1C: No results for input(s): HGBA1C in the last 72 hours. CBG: Recent Labs  Lab 06/06/17 2109 06/07/17 0723 06/07/17 1227 06/07/17 1622 06/07/17 2151  GLUCAP 276* 191* 227* 180* 221*   Lipid Profile: No results for input(s): CHOL, HDL, LDLCALC, TRIG, CHOLHDL, LDLDIRECT in the last 72 hours. Thyroid Function Tests: No results for input(s): TSH, T4TOTAL, FREET4, T3FREE, THYROIDAB in the last 72 hours. Anemia Panel: No results for input(s): VITAMINB12, FOLATE, FERRITIN, TIBC, IRON, RETICCTPCT in the last 72 hours. Sepsis Labs: No results for input(s): PROCALCITON, LATICACIDVEN in the last 168 hours.  Recent Results (from the past 240 hour(s))  MRSA PCR Screening     Status: None   Collection Time: 06/03/17  4:50 AM  Result Value Ref Range Status   MRSA by PCR NEGATIVE NEGATIVE Final    Comment:        The GeneXpert MRSA Assay (FDA approved for NASAL specimens only), is one component of a comprehensive MRSA  colonization surveillance program. It is not intended to diagnose MRSA infection nor to guide or monitor treatment for MRSA infections. Performed at [facility], [address] [place], [place] [address]          Radiology Studies: No results found.      Scheduled Meds: . apixaban  5 mg Oral BID  . atorvastatin  80 mg Oral QHS  . buPROPion  150 mg Oral BID  . donepezil  5 mg Oral q morning - 10a  . feeding supplement (ENSURE ENLIVE)  237 mL Oral TID BM  . insulin aspart  0-15 Units Subcutaneous TID WC  . insulin glargine  10 Units Subcutaneous QHS  . ipratropium-albuterol  3 mL Nebulization BID  . isosorbide mononitrate  30 mg Oral Daily  . losartan  50 mg Oral Daily  . mirtazapine  15 mg Oral QHS  . multivitamin with minerals  1 tablet Oral Daily  . nebivolol  20 mg Oral QHS  . pantoprazole  40 mg Oral BID AC  . QUEtiapine  100 mg Oral QHS  . senna-docusate  2 tablet Oral BID  . tamsulosin  0.4 mg Oral Daily  . thiamine  100 mg Oral Daily  . vitamin B-12  2,000 mcg Oral QHS   Continuous Infusions:   LOS: 9 days    Time spent:    Erick Blinks, MD [facility] Pager [phone]  If 7PM-7AM, please contact night-coverage [email or web address] Password [facility] 06/08/2017, 8:46 AM

## 2017-06-08 NOTE — Progress Notes (Addendum)
Physical Therapy Treatment Patient Details Name: Luis Yu MRN: [id number] DOB: October 01, 1945 Today's Date: 06/08/2017    History of Present Illness Luis Yu is a 72 y.o. male s/p acute CVA after admission with medical history significant of paroxysmal atrial fibrillation on Eliquis, chronic diastolic congestive heart failure, recurrent dental infections, presented to the hospital with fever.  Patient has had a prior stroke and has some residual cognitive effects.  History is mostly obtained from his wife.  She reports that he was in his usual state of health the day before going to hospital. The morning he went to the hospital he woke up confused, having chills.  He was noted to be trembling and shaking.  He began coughing.  No vomiting or diarrhea.  He was noted to be lethargic/confused.  He was brought to the emergency room for evaluation where he was noted to be mildly hypotensive/tachycardic.  He received IV fluids with correction of hemodynamics.  He was noted to be febrile with a temperature of 102.  He received Tylenol with improvement of fever.  Imaging has shown evidence of right sided dental infection    PT Comments    Upon entering room patient was napping. However, patient was easily aroused. Patient was alert, and his behavior was appropriate today and able to participate with therapy.   Patient demonstrates labored movement for sitting up at bedside requiring minimal to moderate assistance of therapist. Patient was able to take 4 side steps at bedside, was able to sit for several minutes, but lost balance backwards with lower extremity exercises. Patient was able  to stand and march in place for up to 4 minutes before having to sit and demonstrates fair/good return for following instructions while completing BLE ROM/strengthening exercises. Patient's blood pressure was found to be 134/78 at start of session, and oxygen saturation remained between 90-100% with 2 L O2 via nasal cannula  with therapist allowing for breaks when oxygen saturation dropped below 94%. At one point the monitor stated patient was in V-Tach and therapist notified nearest nurse immediately, who came to room and determined that the patient was okay. Patient denied any dizziness, shortness of breath, or chest pain. Nursing was notified of patient left in bed and his request for a drink. Patient will benefit from continued physical therapy in hospital and recommended venue below to increase strength, balance, endurance for safe ADLs and gait.   Follow Up Recommendations  CIR;SNF     Equipment Recommendations  None recommended by PT    Recommendations for Other Services       Precautions / Restrictions Precautions Precautions: Fall Restrictions Weight Bearing Restrictions: No    Mobility  Bed Mobility Overal bed mobility: Needs Assistance Bed Mobility: Supine to Sit Rolling: Min assist;Mod assist     Sit to supine: Min assist   General bed mobility comments: verbal/tactile cues to push up to scoot in bed using arms, tends to lean backwards  Transfers Overall transfer level: Needs assistance Equipment used: Rolling walker (2 wheeled) Transfers: Sit to/from Stand Sit to Stand: Mod assist;Min assist         General transfer comment: Leans very far anteriorly over walker  Ambulation/Gait Ambulation/Gait assistance: Mod assist Ambulation Distance (Feet): 4 Feet Assistive device: Rolling walker (2 wheeled) Gait Pattern/deviations: Decreased step length - right;Decreased step length - left;Decreased stride length   Gait velocity interpretation: Below normal speed for age/gender General Gait Details: limited 4 unsteady side steps due to poor standing balance with  leaning over RW then patient requesting to return to bed due to fatigue   Stairs            Wheelchair Mobility    Modified Rankin (Stroke Patients Only)       Balance Overall balance assessment: Needs  assistance Sitting-balance support: Feet supported;Bilateral upper extremity supported Sitting balance-Leahy Scale: Poor Sitting balance - Comments: fair when static sitting, poor when performing exercises with frequent leaning backwards Postural control: Posterior lean Standing balance support: Bilateral upper extremity supported;During functional activity Standing balance-Leahy Scale: Poor Standing balance comment: fair/poor with RW                            Cognition Arousal/Alertness: Awake/alert Behavior During Therapy: WFL for tasks assessed/performed Overall Cognitive Status: Impaired/Different from baseline Area of Impairment: Following commands;Safety/judgement                       Following Commands: Follows one step commands inconsistently              Exercises General Exercises - Lower Extremity Long Arc Quad: Seated;AROM;Strengthening;Both;10 reps Hip Flexion/Marching: Seated;AROM;Strengthening;10 reps;Both Toe Raises: Seated;AROM;Strengthening;10 reps;Both Heel Raises: Seated;AROM;Strengthening;Both;10 reps Other Exercises Other Exercises: standing march in place x 20 steps using RW verbal and tactile cues to stand up tall    General Comments        Pertinent Vitals/Pain Pain Assessment: No/denies pain    Home Living                      Prior Function            PT Goals (current goals can now be found in the care plan section) Acute Rehab PT Goals Patient Stated Goal: return home PT Goal Formulation: With patient/family Time For Goal Achievement: 06/26/17 Potential to Achieve Goals: Good Progress towards PT goals: Progressing toward goals    Frequency    Min 6X/week      PT Plan Current plan remains appropriate    Co-evaluation              AM-PAC PT "6 Clicks" Daily Activity  Outcome Measure  Difficulty turning over in bed (including adjusting bedclothes, sheets and blankets)?: A  Little Difficulty moving from lying on back to sitting on the side of the bed? : A Little Difficulty sitting down on and standing up from a chair with arms (e.g., wheelchair, bedside commode, etc,.)?: A Lot Help needed moving to and from a bed to chair (including a wheelchair)?: A Lot Help needed walking in hospital room?: A Lot Help needed climbing 3-5 steps with a railing? : Total 6 Click Score: 13    End of Session Equipment Utilized During Treatment: Gait belt;Oxygen(2L via [place] upon entering room) Activity Tolerance: Patient tolerated treatment well;Patient limited by fatigue Patient left: with call bell/phone within reach;in bed;with bed alarm set Nurse Communication: Mobility status;Other (comment)(RN aware that patient is in bed with alarm on) PT Visit Diagnosis: Unsteadiness on feet (R26.81);Other abnormalities of gait and mobility (R26.89);Muscle weakness (generalized) (M62.81)     Time: [id number] PT Time Calculation (min) (ACUTE ONLY): 32 min  Charges:  $Therapeutic Exercise: 8-22 mins $Therapeutic Activity: 8-22 mins                    G Codes:      Verne CarrowMacy Bahja Bence PT, DPT 12:02 PM, 06/08/17 [phone]

## 2017-06-08 NOTE — Progress Notes (Signed)
Pt is having a period of verbal aggression and trying to get out of bed, Refuses assistance, refuses liquids, refuses medication and monitors.

## 2017-06-09 LAB — GLUCOSE, CAPILLARY
Glucose-Capillary: 271 mg/dL — ABNORMAL HIGH (ref 65–99)
Glucose-Capillary: 201 mg/dL — ABNORMAL HIGH (ref 65–99)
Glucose-Capillary: 141 mg/dL — ABNORMAL HIGH (ref 65–99)
Glucose-Capillary: 167 mg/dL — ABNORMAL HIGH (ref 65–99)
Glucose-Capillary: 175 mg/dL — ABNORMAL HIGH (ref 65–99)

## 2017-06-09 MED ORDER — QUETIAPINE FUMARATE 100 MG PO TABS
200.0000 mg | ORAL_TABLET | Freq: Every evening | ORAL | Status: DC
Start: 2017-06-09 — End: 2017-06-12
  Administered 2017-06-09 – 2017-06-11 (×3): 200 mg via ORAL
  Filled 2017-06-09 (×3): qty 2

## 2017-06-09 NOTE — Progress Notes (Signed)
PROGRESS NOTE    Luis Yu  WUJ:[id number] DOB: [birth year] DOA: 05/29/2017 PCP: Shirline Frees, NP    Brief Narrative:  72 year old male admitted to the hospital with fever, altered mental status and right sided dental infection.  Started on intravenous antibiotics.  Despite treatment, his fevers persisted and further workup with CT chest indicated evidence of aspiration pneumonia.  He was seen by speech therapy and noted to have dysphagia requiring nectar thick liquids.  Patient was slowly improving with IV antibiotic therapy, but then had a change in mental status.  Workup indicated new right-sided lacunar infarcts.  He was seen by neurology and underwent further workup.  Hospital course was further complicated by development of increasing agitation/sundowning.  At one point, patient became very agitated, aspirated and became increasingly short of breath necessitating BiPAP.  Since that time, respiratory status has improved and oxygen has been weaned down.  Barrier to discharge at this point has been patient's agitation.  He has underlying vascular dementia, has sundowning and frequently becomes agitated/combative requiring four-point restraints.  He has been started on Seroquel with some improvement, but still becomes very agitated.  Psychiatry will be consulted to request further assistance with management of agitation.  Patient may benefit from discharge to [facility] facility such as [place].   Assessment & Plan:   Principal Problem:   Acute CVA (cerebrovascular accident) (HCC) Active Problems:   DM (diabetes mellitus) type II uncontrolled with retinopathy   Hyperlipidemia   OSA (obstructive sleep apnea)   Essential hypertension   GERD   Atrial fibrillation (HCC)   Stroke (cerebrum) (HCC)   Coronary artery disease involving coronary bypass graft of native heart without angina pectoris   Hypertension   GERD (gastroesophageal reflux disease)   Diabetic peripheral neuropathy  (HCC)   Diabetes mellitus type II, uncontrolled (HCC)   Dental infection   Chronic diastolic CHF (congestive heart failure) (HCC)   Aspiration pneumonia (HCC)   Protein-calorie malnutrition, severe   Vascular dementia   1. Sepsis.  Related to acute right-sided dental infection as well as aspiration pneumonia.  Improved with a course of intravenous antibiotics.  He has completed 7 days of IV Unasyn.  Cultures have been negative.  Hemodynamics are stable.  He is not having any fevers. 2. Right sided dental infection.  CT scan did not indicate any drainable abscess.  Clinically he has improved with IV antibiotics.  He will need to follow-up with his dentist. 3. Aspiration pneumonia.  Treated with 7 days of IV Unasyn.  Seen by speech therapy and found to have dysphagia requiring nectar thickened liquids.   4. Vascular dementia with agitation.  Started on Seroquel as well as Aricept.  Patient has been having significant sundowning and frequently requires four-point restraints.  Seroquel dose has been increased.  Psychiatry consultation to see if he would be an appropriate candidate to be discharged to [facility] psych unit 5. Acute CVA.  Patient had a change in mental status during his hospitalization.  CT head/MRI brain indicated 2 small acute lacunar infarcts in the right hemisphere.  He was not a candidate for thrombolytics since he is chronically on anticoagulation.  He was seen by neurology and underwent stroke workup.  Echocardiogram unremarkable.  Carotid Dopplers show incidental finding of moderate left internal carotid artery stenosis (50-69%).  Discussed with vascular surgery (Dr. Darrick Penna) who recommended outpatient follow-up.  Stroke felt to be embolic in nature.  Recommendations from neurology are to continue on anticoagulation and risk factor modification  6. Paroxysmal atrial fibrillation.  Chads vas score of 3.  Heart rate is stable on beta blockers. Anticoagulated with  eliquis 7. Chronic diastolic congestive heart failure.  Ejection fraction of 55%.  Lasix currently on hold since p.o. intake has been poor.  Weight appears to be near baseline.  Continue to monitor. 8. GERD.  Continue on PPI 9. Hyperlipidemia.  Continue on statin. 10. Diabetes, type II.  Blood sugars are stable.  Continue on Lantus and sliding scale 11. Hypertension.  Continue on Bystolic and losartan. 12. Urinary retention.  Patient required Foley catheter placement.  Flomax was added and Foley catheter discontinued on 3/20.  Currently, he has a condom catheter and is voiding.   DVT prophylaxis: Eliquis Code Status: DNR Family Communication: Discussed with wife at the bedside Disposition Plan: Plan is for skilled nursing facility on discharge, although patient's agitation is currently a barrier.  May need to discharge to [facility] psych facility pending psychiatry evaluation..   Consultants:   Neurology  Speech therapy  Procedures:     Antimicrobials:   Completed 7 days of Unasyn   Subjective: Patient was increasingly agitated overnight requiring four-point restraints and was combative.  Objective: Vitals:   06/09/17 0608 06/09/17 0700 06/09/17 0740 06/09/17 0918  BP: (!) 144/76     Pulse: 72 62    Resp: (!) 24 13    Temp:   98.3 F (36.8 C)   TempSrc:   Axillary   SpO2: 100% 98% 100% 100%  Weight:      Height:        Intake/Output Summary (Last 24 hours) at 06/09/2017 1414 Last data filed at 06/09/2017 1345 Gross per 24 hour  Intake 227 ml  Output 325 ml  Net -98 ml   Filed Weights   06/06/17 0500 06/07/17 0500 06/08/17 0500  Weight: 90.1 kg (198 lb 10.2 oz) 92.7 kg (204 lb 5.9 oz) 93 kg (205 lb 0.4 oz)    Examination:  General exam: wakes up to voice, no distress Respiratory system: Clear to auscultation. Respiratory effort normal. Cardiovascular system:RRR. No murmurs, rubs, gallops. Gastrointestinal system: Abdomen is nondistended, soft and nontender. No  organomegaly or masses felt. Normal bowel sounds heard. Central nervous system: No focal neurological deficits. Extremities: No C/C/E, +pedal pulses Skin: No rashes, lesions or ulcers Psychiatry: confused but pleasant   Data Reviewed: I have personally reviewed following labs and imaging studies  CBC: Recent Labs  Lab 06/03/17 0730 06/04/17 0343 06/06/17 0516 06/08/17 0450  WBC 8.7 9.2 7.7 8.0  HGB 8.9* 9.1* 8.8* 8.7*  HCT 29.7* 29.7* 29.8* 29.8*  MCV 101.7* 101.0* 102.1* 102.4*  PLT 156 152 163 171   Basic Metabolic Panel: Recent Labs  Lab 06/03/17 0513 06/04/17 0343 06/05/17 0408 06/06/17 0516 06/07/17 0435 06/08/17 0450  NA 142 143  --  143  --  144  K 3.5 3.7  --  3.4*  --  3.4*  CL 102 106  --  107  --  108  CO2 30 27  --  26  --  28  GLUCOSE 143* 115*  --  136*  --  170*  BUN 23* 25*  --  24*  --  28*  CREATININE 1.17 0.92 0.91 0.88 0.85 0.87  CALCIUM 8.6* 8.7*  --  8.4*  --  8.4*  MG 1.9  --   --  1.9  --   --    GFR: Estimated Creatinine Clearance: 91.7 mL/min (by C-G formula based on SCr  of 0.87 mg/dL). Liver Function Tests: No results for input(s): AST, ALT, ALKPHOS, BILITOT, PROT, ALBUMIN in the last 168 hours. No results for input(s): LIPASE, AMYLASE in the last 168 hours. No results for input(s): AMMONIA in the last 168 hours. Coagulation Profile: No results for input(s): INR, PROTIME in the last 168 hours. Cardiac Enzymes: No results for input(s): CKTOTAL, CKMB, CKMBINDEX, TROPONINI in the last 168 hours. BNP (last 3 results) No results for input(s): PROBNP in the last 8760 hours. HbA1C: No results for input(s): HGBA1C in the last 72 hours. CBG: Recent Labs  Lab 06/08/17 1146 06/08/17 1617 06/09/17 0735 06/09/17 0942 06/09/17 1134  GLUCAP 182* 209* 271* 201* 141*   Lipid Profile: No results for input(s): CHOL, HDL, LDLCALC, TRIG, CHOLHDL, LDLDIRECT in the last 72 hours. Thyroid Function Tests: No results for input(s): TSH, T4TOTAL,  FREET4, T3FREE, THYROIDAB in the last 72 hours. Anemia Panel: No results for input(s): VITAMINB12, FOLATE, FERRITIN, TIBC, IRON, RETICCTPCT in the last 72 hours. Sepsis Labs: No results for input(s): PROCALCITON, LATICACIDVEN in the last 168 hours.  Recent Results (from the past 240 hour(s))  MRSA PCR Screening     Status: None   Collection Time: 06/03/17  4:50 AM  Result Value Ref Range Status   MRSA by PCR NEGATIVE NEGATIVE Final    Comment:        The GeneXpert MRSA Assay (FDA approved for NASAL specimens only), is one component of a comprehensive MRSA colonization surveillance program. It is not intended to diagnose MRSA infection nor to guide or monitor treatment for MRSA infections. Performed at [facility], [address] [place], [place] [address]          Radiology Studies: No results found.      Scheduled Meds: . apixaban  5 mg Oral BID  . atorvastatin  80 mg Oral QHS  . buPROPion  150 mg Oral BID  . donepezil  5 mg Oral q morning - 10a  . feeding supplement (ENSURE ENLIVE)  237 mL Oral TID BM  . insulin aspart  0-15 Units Subcutaneous TID WC  . insulin glargine  10 Units Subcutaneous QHS  . ipratropium-albuterol  3 mL Nebulization BID  . isosorbide mononitrate  30 mg Oral Daily  . losartan  50 mg Oral Daily  . mirtazapine  15 mg Oral QHS  . multivitamin with minerals  1 tablet Oral Daily  . nebivolol  20 mg Oral QHS  . pantoprazole  40 mg Oral BID AC  . QUEtiapine  200 mg Oral QPM  . senna-docusate  2 tablet Oral BID  . tamsulosin  0.4 mg Oral Daily  . thiamine  100 mg Oral Daily  . vitamin B-12  2,000 mcg Oral QHS   Continuous Infusions:   LOS: 10 days    Time spent: 25mins    Erick BlinksJehanzeb Ahmet Schank, MD [facility] Pager [phone]  If 7PM-7AM, please contact night-coverage [email or web address] Password [email or web address] 06/09/2017, 2:14 PM

## 2017-06-09 NOTE — Progress Notes (Signed)
Pt is in 4 point restraints after being aggressive and striking staff and fighting. While administering a medication, Patient became violent without warning striking at staff and trying to bite and kick. Pt is asleep at this time and family member is present.

## 2017-06-09 NOTE — Progress Notes (Signed)
Source blood drawn due to scratching employee. Hospitalist contacted. Sample drawn by lab.

## 2017-06-09 NOTE — Consult Note (Signed)
Medication recommendation review completed:   Current psych medications:   Wellbutrin 150 mg 1 PO BID for depression Aricept 5 mg 1 tab PO daily for memory care Haldol 5 mg IV Q6H PRN for agitation Mirtazapine 15 mg 1 tab PO QHS for insomnia/depression Seroquel 200 mg 1 tab PO QHS for mood control   Recommendation: No medication changes recommended at this time; patient to be assessed by psychiatry tomorrow further recommendations.   Signed: Ferne ReusJustina Macyn Remmert, NP-C

## 2017-06-10 DIAGNOSIS — E1165 Type 2 diabetes mellitus with hyperglycemia: Secondary | ICD-10-CM

## 2017-06-10 DIAGNOSIS — I25810 Atherosclerosis of coronary artery bypass graft(s) without angina pectoris: Secondary | ICD-10-CM

## 2017-06-10 DIAGNOSIS — I5032 Chronic diastolic (congestive) heart failure: Secondary | ICD-10-CM

## 2017-06-10 DIAGNOSIS — E785 Hyperlipidemia, unspecified: Secondary | ICD-10-CM

## 2017-06-10 DIAGNOSIS — F0151 Vascular dementia with behavioral disturbance: Secondary | ICD-10-CM

## 2017-06-10 DIAGNOSIS — G4733 Obstructive sleep apnea (adult) (pediatric): Secondary | ICD-10-CM

## 2017-06-10 DIAGNOSIS — J690 Pneumonitis due to inhalation of food and vomit: Secondary | ICD-10-CM

## 2017-06-10 DIAGNOSIS — K047 Periapical abscess without sinus: Secondary | ICD-10-CM

## 2017-06-10 DIAGNOSIS — I639 Cerebral infarction, unspecified: Secondary | ICD-10-CM

## 2017-06-10 DIAGNOSIS — I480 Paroxysmal atrial fibrillation: Secondary | ICD-10-CM

## 2017-06-10 DIAGNOSIS — I10 Essential (primary) hypertension: Secondary | ICD-10-CM

## 2017-06-10 LAB — BASIC METABOLIC PANEL WITH GFR
Anion gap: 9 (ref 5–15)
BUN: 24 mg/dL — ABNORMAL HIGH (ref 6–20)
CO2: 25 mmol/L (ref 22–32)
Calcium: 8.6 mg/dL — ABNORMAL LOW (ref 8.9–10.3)
Chloride: 109 mmol/L (ref 101–111)
Creatinine, Ser: 0.91 mg/dL (ref 0.61–1.24)
GFR calc Af Amer: >60 mL/min
GFR calc non Af Amer: >60 mL/min
Glucose, Bld: 181 mg/dL — ABNORMAL HIGH (ref 65–99)
Potassium: 3.4 mmol/L — ABNORMAL LOW (ref 3.5–5.1)
Sodium: 143 mmol/L (ref 135–145)

## 2017-06-10 LAB — HIV-1 RNA QUANT-NO REFLEX-BLD
HIV 1 RNA Quant: <20 copies/mL
LOG10 HIV-1 RNA: UNDETERMINED log10copy/mL

## 2017-06-10 LAB — GLUCOSE, CAPILLARY
Glucose-Capillary: 195 mg/dL — ABNORMAL HIGH (ref 65–99)
Glucose-Capillary: 229 mg/dL — ABNORMAL HIGH (ref 65–99)
Glucose-Capillary: 186 mg/dL — ABNORMAL HIGH (ref 65–99)
Glucose-Capillary: 208 mg/dL — ABNORMAL HIGH (ref 65–99)
Glucose-Capillary: 98 mg/dL (ref 65–99)
Glucose-Capillary: 154 mg/dL — ABNORMAL HIGH (ref 65–99)

## 2017-06-10 LAB — CBC
HCT: 28.6 % — ABNORMAL LOW (ref 39.0–52.0)
Hemoglobin: 8.5 g/dL — ABNORMAL LOW (ref 13.0–17.0)
MCH: 30.4 pg (ref 26.0–34.0)
MCHC: 29.7 g/dL — ABNORMAL LOW (ref 30.0–36.0)
MCV: 102.1 fL — ABNORMAL HIGH (ref 78.0–100.0)
Platelets: 177 10*3/uL (ref 150–400)
RBC: 2.8 MIL/uL — ABNORMAL LOW (ref 4.22–5.81)
RDW: 18.1 % — ABNORMAL HIGH (ref 11.5–15.5)
WBC: 8.3 10*3/uL (ref 4.0–10.5)

## 2017-06-10 LAB — HIV ANTIBODY (ROUTINE TESTING W REFLEX): HIV Screen 4th Generation wRfx: NONREACTIVE

## 2017-06-10 LAB — VITAMIN B12: Vitamin B-12: 4667 pg/mL — ABNORMAL HIGH (ref 180–914)

## 2017-06-10 LAB — T-HELPER CELLS (CD4) COUNT (NOT AT ARMC)
CD4 % Helper T Cell: 42 % (ref 33–55)
CD4 T Cell Abs: 410 /uL (ref 400–2700)

## 2017-06-10 MED ORDER — POTASSIUM CHLORIDE 10 MEQ/100ML IV SOLN
10.0000 meq | INTRAVENOUS | Status: AC
  Administered 2017-06-10 (×4): 10 meq via INTRAVENOUS
  Filled 2017-06-10 (×2): qty 100

## 2017-06-10 MED ORDER — INSULIN ASPART 100 UNIT/ML ~~LOC~~ SOLN
4.0000 [IU] | Freq: Three times a day (TID) | SUBCUTANEOUS | Status: DC
  Administered 2017-06-10 – 2017-06-14 (×5): 4 [IU] via SUBCUTANEOUS

## 2017-06-10 MED ORDER — INSULIN GLARGINE 100 UNIT/ML ~~LOC~~ SOLN
15.0000 [IU] | Freq: Every day | SUBCUTANEOUS | Status: DC
  Administered 2017-06-10 – 2017-06-12 (×3): 15 [IU] via SUBCUTANEOUS
  Filled 2017-06-10 (×7): qty 0.15

## 2017-06-10 NOTE — Progress Notes (Signed)
TTS team reviewed patient status this morning.  Dr. Lucianne MussKumar asked this writer to check med recommendations to ensure patient has appropriate meds.  Pt med were reviewed by Felicity CoyerJustina Onkonkwo, NP, on 06/09/17 and she did not recommend any changes or additions.  CSW contacted [facility] inpatient RN, Lavonne ChickVanessa,to cancel consult as pt does not meet inpatient criteria.  Timmothy EulerJean T. Kaylyn LimSutter, MSW, LCSWA Disposition Clinical Social Work [phone] (cell) [phone] (office)

## 2017-06-10 NOTE — Progress Notes (Signed)
Patient stated he would wear BiPAP. He was placed on Bipap 15/7 with 3 liters oxygen at 0135 by  04:00 he was back off. He is still having problems with dementia. Wife is watching patient and had removed mask. Will continue to monitor.

## 2017-06-10 NOTE — Progress Notes (Signed)
Wife questioned if patient needed to wear his BiPAP/CPAP. Since he is awake now the past few days he has refused. Due to his anger issues have not pushed wearing with him. Most likely he will benefit from use if his confusion does not prohibit.

## 2017-06-10 NOTE — Progress Notes (Addendum)
Progress Note    Luis Yu  WUJ:[id number] DOB: 1945-10-30  DOA: 05/29/2017 PCP: Shirline Frees, NP    Brief Narrative:   Chief complaint: F/U AMS/dental infection.  Medical records reviewed and are as summarized below:  Luis Yu is an 72 y.o. male with a PMH of PAF on Eliquis, chronic diastolic CHF, vascular dementia, recurrent dental infections who was admitted 05/29/17 with fever secondary to a recurrent dental infection. Despite appropriate therapy, his fevers persisted and further workup indicated evidence of aspiration pneumonia. He was evaluated by speech therapy and found to have dysphasia. He was slowly improving with IV antibiotics but then developed changes in his mental status. Workup revealed new right sided lacunar infarcts. Neurology was subsequently consulted. Hospital course further complicated by increasing agitation/sundowning, acute respiratory failure requiring BiPAP. Psychiatry is also following him for treatment of his agitation/sundowning and he has been placed on Seroquel for symptom management.  Pertinent events:  05/29/17: Admitted for treatment of dental abscess.  05/30/17: Noted to be agitated overnight.  Given Ativan and Haldol.  Ativan subsequently held due to somnolence.    Foley placed for urinary retention. PT evaluated patient and recommended SNF.  05/31/17: Placed on vancomycin and Zosyn for aspiration pneumonia after CT chest performed.  06/01/17: Remains encephalopathic.  Haldol dose decreased.  Required restraints.  06/02/17: Became combative.  CT of the brain ordered.  Placed on BiPAP due to respiratory failure after being     given Ativan/Haldol.  06/03/17: Remains combative/aggressive.  Continues to require restraint at times.  Transferred to the ICU.     Stroke workup initiated/neurology consulted.  Placed on Lovenox due to inability to swallow pills.  2D echo  showed EF 40% with basal inferior akinesis and hypokinesis elsewhere.  Carotid  Dopplers showed mild  stenosis on the right and moderate stenosis on the left.  06/04/17: Neurologist evaluated the patient.ST reports oral phase dysphagia, mild aspiration risk. Regular  diet/nectar thick liquids. Agitated in the pm, requiring Haldol/Ativan/restraint.  06/05/17: Foley D/C'd. Neurologist increased Seroquel & started Aricept. Remains confused/agitated at times.  06/06/17: Not a CIR candidate. Neurologist feels sundowning symptoms improving.  06/08/17: Verbally aggressive. Refusing meds.  06/09/17: Blood drawn due to scratching employee and screened for HIV. Back in restraints for  aggressive/combative behaviors.   06/10/17: TTS reviewed meds and does not recommend changes. Wife requests transfer to hospital in [facility].  Called [name] hospital and spoke with Dr. Reyes Ivan who declined transfer, felt patient more appropriate for  rehab.  Assessment/Plan:   Principal Problem:   Acute CVA (cerebrovascular accident) (HCC) 2 small acute lacunar infarcts in the right hemisphere discovered on MRI 06/03/17 after an evaluation was undertaken due to patient's change in behavior with agitation which was documented on 05/30/17.  He was evaluated by neurology.  Felt to have an embolic stroke in the setting of atrial fibrillation and acute illness with sepsis.  Anticoagulation of course recommended.    Agitation/encephalopathy/sundowning RPR non-reactive 12/05/11. TSH WNL. B12. Check B12 (does have a macrocytic anemia). Continue Aricept and Seroquel.  Active Problems:   DM (diabetes mellitus) type II uncontrolled with retinopathy Treated with glipizide and Lantus at home. Currently on moderate scale SSI and 15 units of Lantus. CBGs 141-229. Add meal coverage.    Hyperlipidemia Continue statin.    OSA (obstructive sleep apnea) Continue BiPAP at bedtime.    Essential hypertension On Bystolic and losartan.    GERD Continue PPI.    Atrial fibrillation (HCC)  CHADS2Vasc is 7. On Eliquis.  Rate  controlled on Bystolic.    Coronary artery disease involving coronary bypass graft of native heart without angina pectoris Stable although 2 D echo does show decreased systolic function.    Dental infection Had right mandibular swelling on presentation and a CT done at that time showed infection without any drainable abscess.  Initially treated with Unasyn.    Chronic diastolic CHF (congestive heart failure) (HCC) Stable on admission.  Treated with Lasix.    Aspiration pneumonia (HCC) Likely from nausea/vomiting in the setting of dental  infection.  Blood cultures done on admission negative.    Protein-calorie malnutrition, severe Encourage PO intake. Body mass index is 25.63 kg/m.    Vascular dementia Continue Aricept.  Acute urinary retention Foley placed 05/29/17.  Flomax subsequently added. Foley subsequently discontinued.   Family Communication/Anticipated D/C date and plan/Code Status   DVT prophylaxis: Eliquis ordered. Code Status: DNR. Family Communication: Wife at bedside. Disposition Plan: Unclear. SNF vs. Discharge home.   Medical Consultants:    Neurology  Telepsychiatry   Anti-Infectives:    S/P 7 days of Unasyn.  Subjective:   Currently calm and without complaint except for being tired and wanting to take a nap.  Objective:    Vitals:   06/09/17 2232 06/10/17 0141 06/10/17 0436 06/10/17 0817  BP:   134/72   Pulse:  75 64   Resp:  16 15   Temp:   98 F (36.7 C)   TempSrc:   Oral   SpO2: 91% 94% 98% 93%  Weight:      Height:        Intake/Output Summary (Last 24 hours) at 06/10/2017 0911 Last data filed at 06/09/2017 1700 Gross per 24 hour  Intake -  Output 550 ml  Net -550 ml   Filed Weights   06/06/17 0500 06/07/17 0500 06/08/17 0500  Weight: 90.1 kg (198 lb 10.2 oz) 92.7 kg (204 lb 5.9 oz) 93 kg (205 lb 0.4 oz)    Exam: General: No acute distress. Cardiovascular: Heart sounds show a regular rate, and rhythm. No gallops or  rubs. No murmurs. No JVD. Lungs: Clear to auscultation bilaterally with good air movement. No rales, rhonchi or wheezes. Abdomen: Soft, nontender, nondistended with normal active bowel sounds. No masses. No hepatosplenomegaly. Neurological: Alert and oriented 1. Moves all extremities 4 with equal strength. Cranial nerves II through XII grossly intact. Skin: Warm and dry. No rashes or lesions. Extremities: No clubbing or cyanosis. No edema. Pedal pulses 2+. Psychiatric: Mood and affect are normal. Insight and judgment are poor.   Data Reviewed:   I have personally reviewed following labs and imaging studies:  Labs: Labs show the following:   Basic Metabolic Panel: Recent Labs  Lab 06/04/17 0343 06/05/17 0408 06/06/17 0516 06/07/17 0435 06/08/17 0450 06/10/17 0541  NA 143  --  143  --  144 143  K 3.7  --  3.4*  --  3.4* 3.4*  CL 106  --  107  --  108 109  CO2 27  --  26  --  28 25  GLUCOSE 115*  --  136*  --  170* 181*  BUN 25*  --  24*  --  28* 24*  CREATININE 0.92 0.91 0.88 0.85 0.87 0.91  CALCIUM 8.7*  --  8.4*  --  8.4* 8.6*  MG  --   --  1.9  --   --   --    GFR Estimated  Creatinine Clearance: 87.7 mL/min (by C-G formula based on SCr of 0.91 mg/dL).  CBC: Recent Labs  Lab 06/04/17 0343 06/06/17 0516 06/08/17 0450 06/10/17 0541  WBC 9.2 7.7 8.0 8.3  HGB 9.1* 8.8* 8.7* 8.5*  HCT 29.7* 29.8* 29.8* 28.6*  MCV 101.0* 102.1* 102.4* 102.1*  PLT 152 163 171 177   CBG: Recent Labs  Lab 06/09/17 0942 06/09/17 1134 06/09/17 1624 06/09/17 2211 06/10/17 0728  GLUCAP 201* 141* 167* 175* 229*   Microbiology Recent Results (from the past 240 hour(s))  MRSA PCR Screening     Status: None   Collection Time: 06/03/17  4:50 AM  Result Value Ref Range Status   MRSA by PCR NEGATIVE NEGATIVE Final    Comment:        The GeneXpert MRSA Assay (FDA approved for NASAL specimens only), is one component of a comprehensive MRSA colonization surveillance program. It is  not intended to diagnose MRSA infection nor to guide or monitor treatment for MRSA infections. Performed at [facility], [address], [place], [place] [address]     Procedures and diagnostic studies:  No results found.  Medications:   . apixaban  5 mg Oral BID  . atorvastatin  80 mg Oral QHS  . buPROPion  150 mg Oral BID  . donepezil  5 mg Oral q morning - 10a  . feeding supplement (ENSURE ENLIVE)  237 mL Oral TID BM  . insulin aspart  0-15 Units Subcutaneous TID WC  . insulin glargine  10 Units Subcutaneous QHS  . ipratropium-albuterol  3 mL Nebulization BID  . isosorbide mononitrate  30 mg Oral Daily  . losartan  50 mg Oral Daily  . mirtazapine  15 mg Oral QHS  . multivitamin with minerals  1 tablet Oral Daily  . nebivolol  20 mg Oral QHS  . pantoprazole  40 mg Oral BID AC  . QUEtiapine  200 mg Oral QPM  . senna-docusate  2 tablet Oral BID  . tamsulosin  0.4 mg Oral Daily  . thiamine  100 mg Oral Daily  . vitamin B-12  2,000 mcg Oral QHS   Continuous Infusions:   LOS: 11 days    Time based visit: I spent 45 minutes at the patient's bedside discussing the patient's care, treatment options and prognosis with the patient's wife.  Trula Ore Daisuke Bailey  [facility] Pager [phone]. If unable to reach me by pager, please call my cell phone at [phone].  *Please refer to [email or web address], password [email or web address] to get updated schedule on who will round on this patient, as hospitalists switch teams weekly. If 7PM-7AM, please contact night-coverage at [email or web address], password [email or web address] for any overnight needs.  06/10/2017, 9:11 AM

## 2017-06-10 NOTE — Progress Notes (Signed)
Physical Therapy Treatment Patient Details Name: Luis ImusDonald W Loyd MRN: [id number] DOB: 12/16/1945 Today's Date: 06/10/2017    History of Present Illness Luis Yu is a 72 y.o. male s/p acute CVA after admission with medical history significant of paroxysmal atrial fibrillation on Eliquis, chronic diastolic congestive heart failure, recurrent dental infections, presented to the hospital with fever.  Patient has had a prior stroke and has some residual cognitive effects.  History is mostly obtained from his wife.  She reports that he was in his usual state of health the day before going to hospital. The morning he went to the hospital he woke up confused, having chills.  He was noted to be trembling and shaking.  He began coughing.  No vomiting or diarrhea.  He was noted to be lethargic/confused.  He was brought to the emergency room for evaluation where he was noted to be mildly hypotensive/tachycardic.  He received IV fluids with correction of hemodynamics.  He was noted to be febrile with a temperature of 102.  He received Tylenol with improvement of fever.  Imaging has shown evidence of right sided dental infection    PT Comments    MM testing demonstrated good strength of LE.  PT tends to be impulsive and has significant decreased balance.  PT is limited due to becoming SOB with minimal exertion.    Follow Up Recommendations   SNF     Equipment Recommendations    none at this venue    Recommendations for Other Services  OT      Precautions / Restrictions Precautions Precautions: Fall Restrictions Weight Bearing Restrictions: No    Mobility  Bed Mobility Overal bed mobility: Needs Assistance Bed Mobility: Supine to Sit Rolling: Min assist;Mod assist     Sit to supine: Min assist   General bed mobility comments: verbal/tactile cues to push up to scoot in bed using arms, tends to lean backwards  Transfers Overall transfer level: Needs assistance Equipment used: Rolling walker  (2 wheeled) Transfers: Sit to/from Stand Sit to Stand: Min assist Stand pivot transfers: Mod assist       General transfer comment: PT now tending to lean posteriorly.  Completed sit to stand multiple times for activity tolerance; pt becomes SOB very easily.   Ambulation/Gait Ambulation/Gait assistance: Min assist Ambulation Distance (Feet): 4 Feet Assistive device: Rolling walker (2 wheeled) Gait Pattern/deviations: Decreased step length - right;Decreased step length - left;Decreased stride length     General Gait Details: PT tends to sit down without warning.    Stairs            Wheelchair Mobility    Modified Rankin (Stroke Patients Only)       Balance                                               Exercises General Exercises - Lower Extremity Ankle Circles/Pumps: Both;10 reps Quad Sets: Both;10 reps Gluteal Sets: Both;5 reps Long Arc Quad: Both;5 reps Toe Raises: Strengthening;Both;5 reps;Seated    General Comments        Pertinent Vitals/Pain Pain Assessment: No/denies pain    Home Living                      Prior Function   Lives at home.  Ambulates with rolling walker in household.  PT Goals (current goals can now be found in the care plan section)      Frequency     6x a week       PT Plan  Continue to work on dynamic standing balance activity     Co-evaluation                 End of Session               Time: 1530-1555 PT Time Calculation (min) (ACUTE ONLY): 25 min  Charges:  $Therapeutic Activity: 23-37 mins               Virgina Organ, PT CLT [phone] 06/10/2017, 3:56 PM

## 2017-06-11 ENCOUNTER — Inpatient Hospital Stay (HOSPITAL_COMMUNITY): Payer: Medicare Other

## 2017-06-11 LAB — GLUCOSE, CAPILLARY
Glucose-Capillary: 142 mg/dL — ABNORMAL HIGH (ref 65–99)
Glucose-Capillary: 141 mg/dL — ABNORMAL HIGH (ref 65–99)
Glucose-Capillary: 95 mg/dL (ref 65–99)
Glucose-Capillary: 183 mg/dL — ABNORMAL HIGH (ref 65–99)

## 2017-06-11 MED ORDER — FUROSEMIDE 40 MG PO TABS
40.0000 mg | ORAL_TABLET | Freq: Every day | ORAL | Status: DC
  Administered 2017-06-12: 40 mg via ORAL
  Filled 2017-06-11 (×2): qty 1

## 2017-06-11 MED ORDER — POTASSIUM CHLORIDE CRYS ER 20 MEQ PO TBCR
40.0000 meq | EXTENDED_RELEASE_TABLET | Freq: Once | ORAL | Status: AC
  Administered 2017-06-11: 40 meq via ORAL
  Filled 2017-06-11: qty 2

## 2017-06-11 MED ORDER — TAMSULOSIN HCL 0.4 MG PO CAPS
0.8000 mg | ORAL_CAPSULE | Freq: Every day | ORAL | Status: DC
  Administered 2017-06-12 – 2017-06-15 (×2): 0.8 mg via ORAL
  Filled 2017-06-11 (×3): qty 2

## 2017-06-11 MED ORDER — RISPERIDONE 0.5 MG PO TABS
0.5000 mg | ORAL_TABLET | Freq: Every day | ORAL | Status: DC
  Administered 2017-06-11: 0.5 mg via ORAL
  Filled 2017-06-11: qty 1

## 2017-06-11 MED ORDER — FUROSEMIDE 10 MG/ML IJ SOLN
40.0000 mg | Freq: Two times a day (BID) | INTRAMUSCULAR | Status: AC
  Administered 2017-06-11: 40 mg via INTRAVENOUS
  Filled 2017-06-11: qty 4

## 2017-06-11 MED ORDER — FUROSEMIDE 10 MG/ML IJ SOLN
40.0000 mg | Freq: Once | INTRAMUSCULAR | Status: AC
  Administered 2017-06-11: 40 mg via INTRAVENOUS
  Filled 2017-06-11: qty 4

## 2017-06-11 NOTE — Progress Notes (Addendum)
Chest x-Hersh ordered earlier personally reviewed and does show findings consistent with CHF. Patient was given 40 mg of IV Lasix. Returned to the room at 4:30 PM to reevaluate the patient and he remained restless and agitated. Abdomen noted to be distended. Asked nurse to bladder scan the patient and 870 mL of urine scan. I & O cath performed an over 1 L of fluid removed. Patient calmer after bladder decompressed. Will place patient on 40 mg of Lasix twice a day and I&O Cath Q 4 hours and PRN. Wife and friend updated.  Time in 4:30 Time out 5:00  Hillery AldoChristina Ritesh Opara 06/11/2017 5:04 PM

## 2017-06-11 NOTE — Progress Notes (Signed)
OT Cancellation Note  Patient Details Name: Luis Yu MRN: [id number] DOB: 1945/04/13   Cancelled Treatment:    Reason Eval/Treat Not Completed: Medical issues which prohibited therapy;Other (comment). Checked on pt for potential OT session this am, pt presents in 4 point restraints due to aggression with staff overnight. Pt appears confused this am, not acknowledging OT and pulling at restraints and attempting to get out of bed. Will check back on pt and attempt OT session at a later time when pt appropriate for participation.   Ezra SitesLeslie Troxler, OTR/L  [phone] 06/11/2017, 7:45 AM

## 2017-06-11 NOTE — Progress Notes (Signed)
Patient's wife brought to the attention of this RN that the patient's liquids for dinner were thin; which is a change from what the patient had been receiving. After investigating when the patient's diet order was changed to 2 gm Na+ thin liquids was selected. -provider alerted and waiting order change  Patient is very confused and has made multiple attempts to get of bed because as he states, "I just need to get my pants and shoes on and I'll be ready to go".  The patient's wife is very concerned and "scared" because of the time they have spent at the hospital and he (the patient) has not gotten any better but instead has gotten worse. Additionally, she is concerned about the manner in which he (the patient) has been treated. -an apology was issued by this RN and using therapeutic communication an attempt to reassure her (the patient's wife) was made.

## 2017-06-11 NOTE — Progress Notes (Signed)
Requested another medication to help calm patient and help with sleep.  Order for Risperdal given by Dr. Robb Matarrtiz.  Nurse Tech, Dodi and Dia CrawfordLisa Kalima Saylor, RN attempted to give patient medication and something to drink, patient became violent and hit cup splashing beverage all over bed.  Patient became increasing violent trying to hit and kick staff, grabbed staff and twisted arm, kicked staff members.  Order requested for restraints.  Family notified.

## 2017-06-11 NOTE — Progress Notes (Addendum)
Progress Note    Luis Yu  ZOX:[id number] DOB: 1945-04-24  DOA: 05/29/2017 PCP: Shirline FreesNafziger, [name], NP    Brief Narrative:   Chief complaint: F/U AMS/dental infection.  Medical records reviewed and are as summarized below:  Luis Yu is an 72 y.o. male with a PMH of PAF on Eliquis, chronic diastolic CHF, vascular dementia, recurrent dental infections who was admitted 05/29/17 with fever secondary to a recurrent dental infection. Despite appropriate therapy, his fevers persisted and further workup indicated evidence of aspiration pneumonia. He was evaluated by speech therapy and found to have dysphasia. He was slowly improving with IV antibiotics but then developed changes in his mental status. Workup revealed new right sided lacunar infarcts. Neurology was subsequently consulted. Hospital course further complicated by increasing agitation/sundowning, acute respiratory failure requiring BiPAP. Psychiatry is also following him for treatment of his agitation/sundowning and he has been placed on Seroquel for symptom management.  Pertinent events:  05/29/17: Admitted for treatment of dental abscess.  05/30/17: Noted to be agitated overnight.  Given Ativan and Haldol.  Ativan subsequently held due to somnolence.    Foley placed for urinary retention. PT evaluated patient and recommended SNF.  05/31/17: Placed on vancomycin and Zosyn for aspiration pneumonia after CT chest performed.  06/01/17: Remains encephalopathic.  Haldol dose decreased.  Required restraints.  06/02/17: Became combative.  CT of the brain ordered.  Placed on BiPAP due to respiratory failure after being     given Ativan/Haldol.  06/03/17: Remains combative/aggressive.  Continues to require restraint at times.  Transferred to the ICU.     Stroke workup initiated/neurology consulted.  Placed on Lovenox due to inability to swallow pills.  2D echo  showed EF 40% with basal inferior akinesis and hypokinesis elsewhere.  Carotid  Dopplers showed mild  stenosis on the right and moderate stenosis on the left.  06/04/17: Neurologist evaluated the patient.ST reports oral phase dysphagia, mild aspiration risk. Regular  diet/nectar thick liquids. Agitated in the pm, requiring Haldol/Ativan/restraint.  06/05/17: Foley D/C'd. Neurologist increased Seroquel & started Aricept. Remains confused/agitated at times.  06/06/17: Not a CIR candidate. Neurologist feels sundowning symptoms improving.  06/08/17: Verbally aggressive. Refusing meds.  06/09/17: Blood drawn due to scratching employee and screened for HIV. Back in restraints for  aggressive/combative behaviors.   06/10/17: TTS reviewed meds and does not recommend changes. [facility] Reyes IvanVillanova who declined transfer, felt patient more appropriate for  Rehab.  06/11/17: Agitated again last night. Given Risperdal. Noted to be dyspneic. Normally on Lasix at home, but not  taking here. CXR ordered and Lasix 40 mg IV given.  Suspect delirium is being triggered by CHF/dyspnea.  Assessment/Plan:   Principal Problem:   Acute CVA (cerebrovascular accident) (HCC) 2 small acute lacunar infarcts in the right hemisphere discovered on MRI 06/03/17 after an evaluation was undertaken due to patient's change in behavior with agitation which was documented on 05/30/17.  He was evaluated by neurology.  Felt to have an embolic stroke in the setting of atrial fibrillation and acute illness with sepsis.  Anticoagulation of course recommended.    Agitation/encephalopathydelirium. The patient does not have any history of sundowning behavior and I suspect his encephalopathy is from delirium, likely being triggered by dyspnea from CHF. He normally takes Lasix at home but this has not been continued in the hospital. [facility] and give him 40 mg of IV  Lasix now. RPR non-reactive 12/05/11. TSH WNL. B12.  Vitamin B12 levels  4667.  Continue Aricept and Seroquel.  Continues to get agitated and expressed violent behavior in the evening/night.  Required restraints again last night and was given Risperdal.  Active Problems:   DM (diabetes mellitus) type II uncontrolled with retinopathy Treated with glipizide and Lantus at home. Currently on moderate scale SSI with 4 units of meal coverage and 15 units of Lantus. CBGs 98-229.     Hyperlipidemia Continue statin.    OSA (obstructive sleep apnea) Continue BiPAP at bedtime.    Essential hypertension On Bystolic and losartan.    GERD Continue PPI.    Atrial fibrillation (HCC) CHADS2Vasc is 7. On Eliquis.  Rate controlled on Bystolic.    Coronary artery disease involving coronary bypass graft of native heart without angina pectoris Stable although 2 D echo does show decreased systolic function.    Dental infection Had right mandibular swelling on presentation and a CT done at that time showed infection without any drainable abscess.  Initially treated with Unasyn.    Chronic diastolic CHF (congestive heart failure) (HCC) Stable on admission.  Treated with Lasix.    Aspiration pneumonia (HCC) Likely from nausea/vomiting in the setting of dental  infection.  Blood cultures done on admission negative.    Protein-calorie malnutrition, severe Encourage PO intake. Body mass index is 25.63 kg/m.    Vascular dementia Continue Aricept.  Acute urinary retention Foley placed 05/29/17.  Flomax subsequently added. Foley subsequently discontinued.   Family Communication/Anticipated D/C date and plan/Code Status   DVT prophylaxis: Eliquis ordered. Code Status: DNR. Family Communication: Wife at bedside. Disposition Plan: Unclear. SNF vs. Discharge home.   Medical Consultants:    Neurology  Telepsychiatry   Anti-Infectives:    S/P 7 days of Unasyn.  Subjective:   Currently calm and without complaint except for being tired and wanting to take a  nap.  Objective:    Vitals:   06/10/17 2135 06/10/17 2137 06/11/17 0555 06/11/17 0803  BP: 132/81  136/80   Pulse: 74  (!) 49   Resp: 16  18   Temp: 98.4 F (36.9 C)  98 F (36.7 C)   TempSrc: Oral     SpO2: 99% 96% 99% 99%  Weight:      Height:        Intake/Output Summary (Last 24 hours) at 06/11/2017 0804 Last data filed at 06/10/2017 1803 Gross per 24 hour  Intake 240 ml  Output -  Net 240 ml   Filed Weights   06/06/17 0500 06/07/17 0500 06/08/17 0500  Weight: 90.1 kg (198 lb 10.2 oz) 92.7 kg (204 lb 5.9 oz) 93 kg (205 lb 0.4 oz)    Exam: General: No acute distress. Cardiovascular: Heart sounds show a regular rate, and rhythm. No gallops or rubs. No murmurs. No JVD. Lungs: Clear to auscultation bilaterally with good air movement. No rales, rhonchi or wheezes. Abdomen: Soft, nontender, nondistended with normal active bowel sounds. No masses. No hepatosplenomegaly. Neurological: Alert and oriented 1. Moves all extremities 4 with equal strength. Cranial nerves II through XII grossly intact. Skin: Warm and dry. No rashes or lesions. Extremities: No clubbing or cyanosis. No edema. Pedal pulses 2+. Psychiatric: Mood and affect are normal. Insight and judgment are poor.   Data Reviewed:   I have personally reviewed following labs and imaging studies:  Labs: Labs show the following:   Basic Metabolic Panel: Recent Labs  Lab 06/05/17 0408  06/06/17 0516  06/07/17 0435 06/08/17 0450 06/10/17 0541  NA  --   --  143  --  144 143  K  --    < > 3.4*  --  3.4* 3.4*  CL  --   --  107  --  108 109  CO2  --   --  26  --  28 25  GLUCOSE  --   --  136*  --  170* 181*  BUN  --   --  24*  --  28* 24*  CREATININE 0.91  --  0.88 0.85 0.87 0.91  CALCIUM  --   --  8.4*  --  8.4* 8.6*  MG  --   --  1.9  --   --   --    < > = values in this interval not displayed.   GFR Estimated Creatinine Clearance: 87.7 mL/min (by C-G formula based on SCr of 0.91 mg/dL).  CBC: Recent  Labs  Lab 06/06/17 0516 06/08/17 0450 06/10/17 0541  WBC 7.7 8.0 8.3  HGB 8.8* 8.7* 8.5*  HCT 29.8* 29.8* 28.6*  MCV 102.1* 102.4* 102.1*  PLT 163 171 177   CBG: Recent Labs  Lab 06/10/17 0728 06/10/17 1037 06/10/17 1125 06/10/17 1602 06/10/17 2056  GLUCAP 229* 186* 208* 98 154*   Microbiology Recent Results (from the past 240 hour(s))  MRSA PCR Screening     Status: None   Collection Time: 06/03/17  4:50 AM  Result Value Ref Range Status   MRSA by PCR NEGATIVE NEGATIVE Final    Comment:        The GeneXpert MRSA Assay (FDA approved for NASAL specimens only), is one component of a comprehensive MRSA colonization surveillance program. It is not intended to diagnose MRSA infection nor to guide or monitor treatment for MRSA infections. Performed at [facility], [address], [place], [place] [address]     Procedures and diagnostic studies:  No results found.  Medications:   . apixaban  5 mg Oral BID  . atorvastatin  80 mg Oral QHS  . buPROPion  150 mg Oral BID  . donepezil  5 mg Oral q morning - 10a  . feeding supplement (ENSURE ENLIVE)  237 mL Oral TID BM  . insulin aspart  0-15 Units Subcutaneous TID WC  . insulin aspart  4 Units Subcutaneous TID WC  . insulin glargine  15 Units Subcutaneous QHS  . ipratropium-albuterol  3 mL Nebulization BID  . isosorbide mononitrate  30 mg Oral Daily  . losartan  50 mg Oral Daily  . mirtazapine  15 mg Oral QHS  . multivitamin with minerals  1 tablet Oral Daily  . nebivolol  20 mg Oral QHS  . pantoprazole  40 mg Oral BID AC  . QUEtiapine  200 mg Oral QPM  . risperiDONE  0.5 mg Oral QHS  . senna-docusate  2 tablet Oral BID  . tamsulosin  0.4 mg Oral Daily  . thiamine  100 mg Oral Daily  . vitamin B-12  2,000 mcg Oral QHS   Continuous Infusions:   LOS: 12 days    Time based visit: I spent 60 minutes at the patient's bedside discussing the patient's care, treatment options and prognosis with the patient's wife.  The patient's wife requires a lot of reassurance and supportive listening due to being overwhelmed by the change in her husband's condition and concern about his ongoing delirium.  Time in: 1:30 pm Time out: 2:30 pm  Hillery Aldo  [facility] Pager [phone]. If unable to reach me by pager, please call my cell phone at [phone].  *Please refer to [email or web address], password [email or web address] to get updated schedule on who will round on this patient, as hospitalists switch teams weekly. If 7PM-7AM, please contact night-coverage at [email or web address], password [email or web address] for any overnight needs.  06/11/2017, 8:04 AM

## 2017-06-12 ENCOUNTER — Encounter (HOSPITAL_COMMUNITY): Payer: Self-pay | Admitting: Registered Nurse

## 2017-06-12 ENCOUNTER — Other Ambulatory Visit: Payer: Self-pay | Admitting: Adult Health

## 2017-06-12 ENCOUNTER — Inpatient Hospital Stay (HOSPITAL_COMMUNITY): Payer: Medicare Other

## 2017-06-12 DIAGNOSIS — E43 Unspecified severe protein-calorie malnutrition: Secondary | ICD-10-CM

## 2017-06-12 DIAGNOSIS — E1140 Type 2 diabetes mellitus with diabetic neuropathy, unspecified: Secondary | ICD-10-CM

## 2017-06-12 DIAGNOSIS — R451 Restlessness and agitation: Secondary | ICD-10-CM

## 2017-06-12 DIAGNOSIS — Z760 Encounter for issue of repeat prescription: Secondary | ICD-10-CM

## 2017-06-12 DIAGNOSIS — Z794 Long term (current) use of insulin: Principal | ICD-10-CM

## 2017-06-12 DIAGNOSIS — IMO0002 Reserved for concepts with insufficient information to code with codable children: Secondary | ICD-10-CM

## 2017-06-12 DIAGNOSIS — E1165 Type 2 diabetes mellitus with hyperglycemia: Principal | ICD-10-CM

## 2017-06-12 DIAGNOSIS — E1142 Type 2 diabetes mellitus with diabetic polyneuropathy: Secondary | ICD-10-CM

## 2017-06-12 LAB — BASIC METABOLIC PANEL
Anion gap: 11 (ref 5–15)
BUN: 24 mg/dL — ABNORMAL HIGH (ref 6–20)
CO2: 27 mmol/L (ref 22–32)
Calcium: 8.9 mg/dL (ref 8.9–10.3)
Chloride: 107 mmol/L (ref 101–111)
Creatinine, Ser: 1.12 mg/dL (ref 0.61–1.24)
GFR calc Af Amer: >60 mL/min (ref >60)
GFR calc non Af Amer: >60 mL/min (ref >60)
Glucose, Bld: 144 mg/dL — ABNORMAL HIGH (ref 65–99)
Potassium: 3.8 mmol/L (ref 3.5–5.1)
Sodium: 145 mmol/L (ref 135–145)

## 2017-06-12 LAB — GLUCOSE, CAPILLARY
Glucose-Capillary: 121 mg/dL — ABNORMAL HIGH (ref 65–99)
Glucose-Capillary: 186 mg/dL — ABNORMAL HIGH (ref 65–99)
Glucose-Capillary: 156 mg/dL — ABNORMAL HIGH (ref 65–99)

## 2017-06-12 MED ORDER — QUETIAPINE FUMARATE 25 MG PO TABS: 150.0000 mg | ORAL_TABLET | Freq: Every evening | ORAL | Status: DC

## 2017-06-12 MED ORDER — QUETIAPINE FUMARATE 100 MG PO TABS
200.0000 mg | ORAL_TABLET | Freq: Every evening | ORAL | Status: DC
  Administered 2017-06-12 – 2017-06-13 (×2): 200 mg via ORAL
  Filled 2017-06-12 (×3): qty 2

## 2017-06-12 MED ORDER — QUETIAPINE FUMARATE 25 MG PO TABS
25.0000 mg | ORAL_TABLET | Freq: Two times a day (BID) | ORAL | Status: DC | PRN
Start: 2017-06-12 — End: 2017-06-15
  Administered 2017-06-13 (×3): 25 mg via ORAL
  Filled 2017-06-12 (×3): qty 1

## 2017-06-12 NOTE — Progress Notes (Signed)
Patient had order for bladder scan. Bladder scan volume >200 ml x 3 scans. Printed report placed in chart. External catheter had 350 ml tea colored urine in drainage bag. Text paged Dr. Laural BenesJohnson to notify of results. Earnstine RegalAshley Itha Kroeker, RN

## 2017-06-12 NOTE — Progress Notes (Addendum)
Physical Therapy Treatment Patient Details Name: Luis Yu MRN: [id number] DOB: 31-Jan-1946 Today's Date: 06/12/2017    History of Present Illness Luis Yu is a 72 y.o. male s/p acute CVA after admission with medical history significant of paroxysmal atrial fibrillation on Eliquis, chronic diastolic congestive heart failure, recurrent dental infections, presented to the hospital with fever.  Patient has had a prior stroke and has some residual cognitive effects.  History is mostly obtained from his wife.  She reports that he was in his usual state of health the day before going to hospital. The morning he went to the hospital he woke up confused, having chills.  He was noted to be trembling and shaking.  He began coughing.  No vomiting or diarrhea.  He was noted to be lethargic/confused.  He was brought to the emergency room for evaluation where he was noted to be mildly hypotensive/tachycardic.  He received IV fluids with correction of hemodynamics.  He was noted to be febrile with a temperature of 102.  He received Tylenol with improvement of fever.  Imaging has shown evidence of right sided dental infection    PT Comments    Patient presents alert, restless and agreeable for therapy.  Patient demonstrates poor sitting balance with frequent falling backwards and to the right, able to hang onto to his knees to maintain sitting balance for up to 2-3 minutes before falling over, unable to attempt sit to stands or transfer due to poor balance, BLE weakness and fair/poor carryover for following instructions.  Patient put back to bed and demonstrates fair/good return for pull self up to head of bed using head board.  Patient will benefit from continued physical therapy in hospital and recommended venue below to increase strength, balance, endurance for safe ADLs and gait.    Follow Up Recommendations  SNF     Equipment Recommendations  None recommended by PT    Recommendations for Other  Services       Precautions / Restrictions Precautions Precautions: Fall Restrictions Weight Bearing Restrictions: No    Mobility  Bed Mobility Overal bed mobility: Needs Assistance Bed Mobility: Supine to Sit;Sit to Supine     Supine to sit: Mod assist Sit to supine: Mod assist   General bed mobility comments: very labored movement with fair/poor coordination  Transfers                    Ambulation/Gait                 Stairs            Wheelchair Mobility    Modified Rankin (Stroke Patients Only)       Balance Overall balance assessment: Needs assistance Sitting-balance support: Feet supported;No upper extremity supported Sitting balance-Leahy Scale: Poor Sitting balance - Comments: fair/poor when with BUE support, frequent falling backwards and to the right Postural control: Posterior lean;Right lateral lean                                  Cognition Arousal/Alertness: Awake/alert Behavior During Therapy: WFL for tasks assessed/performed Overall Cognitive Status: Impaired/Different from baseline Area of Impairment: Following commands;Safety/judgement                       Following Commands: Follows one step commands inconsistently              Exercises Other  Exercises Other Exercises: supine active assisted hip/knee/anke flexion/extension x 10 reps each Other Exercises: supine hip abduction/adduction x 10 reps each    General Comments        Pertinent Vitals/Pain Pain Assessment: No/denies pain    Home Living                      Prior Function            PT Goals (current goals can now be found in the care plan section) Acute Rehab PT Goals Patient Stated Goal: return home after rehab PT Goal Formulation: With patient/family Time For Goal Achievement: 06/26/17 Potential to Achieve Goals: Fair Progress towards PT goals: Progressing toward goals    Frequency    Min  6X/week      PT Plan Current plan remains appropriate    Co-evaluation              AM-PAC PT "6 Clicks" Daily Activity  Outcome Measure  Difficulty turning over in bed (including adjusting bedclothes, sheets and blankets)?: A Little Difficulty moving from lying on back to sitting on the side of the bed? : A Lot Difficulty sitting down on and standing up from a chair with arms (e.g., wheelchair, bedside commode, etc,.)?: Unable Help needed moving to and from a bed to chair (including a wheelchair)?: Total Help needed walking in hospital room?: Total Help needed climbing 3-5 steps with a railing? : Total 6 Click Score: 9    End of Session   Activity Tolerance: Patient tolerated treatment well;Patient limited by fatigue Patient left: in bed;with call bell/phone within reach;with bed alarm set Nurse Communication: Mobility status PT Visit Diagnosis: Unsteadiness on feet (R26.81);Other abnormalities of gait and mobility (R26.89);Muscle weakness (generalized) (M62.81)     Time: 1610-9604 PT Time Calculation (min) (ACUTE ONLY): 28 min  Charges:  $Therapeutic Activity: 23-37 mins                    G Codes:       3:14 PM, 07/08/2017 Ocie Bob, MPT Physical Therapist with [facility] [phone] office [phone] mobile phone

## 2017-06-12 NOTE — Progress Notes (Addendum)
Patient placed in 2 point restraints at 1026 by Elby ShowersJ. Mays, RN and M. Juanetta GoslingHawkins, RN after speaking with Dr. Laural BenesJohnson this am. Left ankle and right wrist soft restraints. Patient's wife Isa RankinGlenna Rathman arrived this am about 1130. Discussed with her that patient had been changed to 2 point restraints after discussing with physician. Verbalized understanding. Patient calm at that time and asleep. Notified Dr. Laural BenesJohnson, received order to try removing restraints since patient calm at this time. Order received to use all 4 bedrails if necessary for safety. 3 bed rails up at this time. bedalarm on, fall matt in place for safety, call light and personal items within reach. Lighting limited to reduce stimulation. Safety sitter rounding with patient as well. Discussed with patient's wife [name] that restraints have been removed and discussed fall prevention/safety plan. Verbalized understanding and stated would call for nursing assistance and not attempt helping him out of bed. Nursing to continue monitoring. Earnstine RegalAshley Raygan Skarda, RN Late entry for [phone]. Patient's wife [name] also requested update from MD and wanted call to her cell phone to avoid waking patient in room. notifed Dr. Laural BenesJohnson of this earlier when called as well. Earnstine RegalAshley Ivyrose Hashman, RN

## 2017-06-12 NOTE — Telephone Encounter (Signed)
Sent to the pharmacy by e-scribe. 

## 2017-06-12 NOTE — Progress Notes (Signed)
Speech Language Pathology Treatment: Dysphagia  Patient Details Name: Luis Yu MRN: [id number] DOB: December 26, 1945 Today's Date: 06/12/2017 Time: 7829-5621 SLP Time Calculation (min) (ACUTE ONLY): 22 min  Assessment / Plan / Recommendation Clinical Impression  Pt seen for ongoing diagnostic dysphagia intervention. His wife was present for session and feeding Pt egg salad. Pt eager to try thin liquids with SLP this date. Pt did not recall previous recommendations due to fluctuating mental status. SLP provided mod/max multimodality cues for implementation of chin tuck during thin liquid trials, which unfortunately elicited immediate coughing and throat clearing on most trials. SLP explained rationale for thickened liquids to Pt and spouse who verbalized understanding. Pt with occasional throat clearing with NTL Ensure. Pt with oral phase dysphagia with solids at this time likely due to reduced cognition as evidenced by prolonged mastication, oral scatter, and oral holding with egg salad. Reduced cognition continues to be a barrier to progress at this point. SLP will check back tomorrow.    HPI HPI: This is 72 year old white male who presents with fever and shaking along with facial swelling. It appears the patient had the dental abscess associated with cellulitis. He has been worked up extensively. Appears up blood cultures have not been positive. He has had x-Spearing showing the pneumonia presumably due to aspiration. Patient has been encephalopathic and agitated which resulted in the current consultation. He had a stroke about a year ago. The wife reports that he had significant right-sided weakness with a stroke and did have some residual problems from this. She tells me that he did have a footdrop on the right related to his stroke although he has had some neuropathy which may have contributed. Neuropathy is a more longstanding issue than the recent stroke a year ago. He is noted to have a mild  dysarthria which wife tells me fluctuates and is probably his baseline. The patient's agitation have been occurring during the day and also at nighttime but may have been worse at nighttime and worse with of his CPAP machine while he has been hospitalized. He appears to have improved significantly this morning however. She reports that he is pretty close to baseline. The patient himself has little recollection of the events while he is hospitalized in the for canal provide any history. However, the wife does not report any clear focal new symptoms such is worsening dysarthria or worsening weakness on the right side. No left-sided weakness is reported. No visual changes are reported. The review of systems otherwise negative. Multiple infarcts both acute and chronic with increased risk of vascular dementia. Wife reports that he often became "choked up" after eating or drinking. CT chest performed whichconfirmed evidence of aspiration pneumonia. Antibiotic coverage has been broadened to vancomycin and Zosyn. Speech therapy consulted for swallow evaluation.      SLP Plan  Continue with current plan of care       Recommendations  Diet recommendations: Regular;Nectar-thick liquid Liquids provided via: Cup Medication Administration: Whole meds with liquid Supervision: Staff to assist with self feeding;Full supervision/cueing for compensatory strategies Compensations: Slow rate;Small sips/bites;Clear throat intermittently Postural Changes and/or Swallow Maneuvers: Seated upright 90 degrees                Oral Care Recommendations: Oral care BID Follow up Recommendations: Skilled Nursing facility SLP Visit Diagnosis: Dysphagia, unspecified (R13.10) Plan: Continue with current plan of care       Thank you,  Havery Moros, CCC-SLP [phone]  Byrant Valent 06/12/2017, 2:38 PM

## 2017-06-12 NOTE — Progress Notes (Signed)
OT Cancellation Note  Patient Details Name: Doretha ImusDonald W Wanek MRN: [id number] DOB: 27-Nov-1945   Cancelled Treatment:    Reason Eval/Treat Not Completed: Other (comment). Attempted to see pt this am, pt pleasant but very confused and restless. Pt asking OT to get his pants (there are no pants on shelf pt indicated, which OT showed pt) and socks and help him up to leave. Pt unable to be redirected after multiple attempts.  OT provided total assist for donning socks as pt continues to be in 4 point restraints. Pt continuing to remove covers and attempt to get out of bed. Pt SOB when speaking to OT, OT assisted with correcting fit of nasal canula. Will attempt OT session at a later time if pt able to participate and follow directions.   Ezra SitesLeslie Troxler, OTR/L  [phone] 06/12/2017, 7:45 AM

## 2017-06-12 NOTE — Progress Notes (Addendum)
Nutrition Follow-up  DOCUMENTATION CODES:   Severe malnutrition in context of acute illness/injury  INTERVENTION:   - Recommend liberalizing diet to REGULAR given poor PO intake and refusal to eat Low Sodium meal trays (per pt's wife)  - Continue Ensure Enlive po TID, each supplement provides 350 kcal and 20 grams of protein  - Continue Magic cup TID with meals, each supplement provides 290 kcal and 9 grams of protein  - Continue MVI with minerals  - RD placed order for feeding assistance  NUTRITION DIAGNOSIS:   Severe Malnutrition related to acute illness(severe toxic encephalopathy with delirium) as evidenced by percent weight loss, energy intake < or equal to 50% for > or equal to 5 days(4% weight loss in 8 days).  Ongoing  GOAL:   Patient will meet greater than or equal to 90% of their needs  Progressing  MONITOR:   PO intake, Supplement acceptance, I & O's, Weight trends  ASSESSMENT:   72 year old male with PMH significant for atrial fibrillation, CHF, GERD, HLD, HTN, and DM type 2. Admitted 05/29/17 for severe toxic encephalopathy with delirium. Pt with increased agitation and delirium through hospital admission and was transferred to ICU.   RD unable to speak with pt or perform NFPE as pt's wife requested RD not enter room. Per pt's wife, pt had just fallen asleep. Pt's wife requested RD not enter room so as not to wake him. RD will re-attempt NFPE at a later date.  Per RN notes, pt continues to be combative and delirious. PA suspects delirium is being triggered by CHF/dyspnea. RD noted diet changed to low sodium. Per pt's wife, pt is refusing to eat low sodium meal trays. RD recommends liberalizing pt's diet to promote PO intake.  Spoke with RN who reported that pt did not eat breakfast as he would not wake up. RN attempted to try again with lunch, but pt's wife requested pt not be woken up. Per RN, pt needs assistance with feeding and drinking beverages. RN agreed  for RD to enter this as an order rather than RNs continuing to pass on to next shift. Per pt's wife, pt ate meatballs, rice, and red velvet cake yesterday for lunch. Pt's wife also states pt is enjoying Biochemist, clinicalMagic Cups (does not like vanilla flavor) and is "doing okay but getting tired of the Ensure."  Meal Completion: 0-75% since 06/10/17  Medications reviewed and include: 40 mg Lasix daily, sliding scale Novolog, 4 units Novolog TID with means, 15 units Lantus daily, 15 mg Remeron daily, MVI with minerals, 40 mg Protonix daily, 2 tables Senokot-S BID, 100 mg thiamine daily, 2000 mcg vitamin B-12 daily  Labs reviewed: BUN 24, hemoglobin 8.5, HCT 28.6, vitamin B-12 4667 CBGs: 121, 183, 95 x 24 hours  NUTRITION - FOCUSED PHYSICAL EXAM:  Unable to perform as pt's wife requested RD not enter room. RD will attempt at a later date.  Diet Order:  Diet 2 gram sodium Room service appropriate? Yes; Fluid consistency: Nectar Thick  EDUCATION NEEDS:   No education needs have been identified at this time  Skin:  Skin Assessment: Reviewed RN Assessment  Last BM:  06/11/17 medium type 6  Height:   Ht Readings from Last 1 Encounters:  06/03/17 6\' 3"  (1.905 m)    Weight:   Wt Readings from Last 1 Encounters:  06/08/17 205 lb 0.4 oz (93 kg)    Ideal Body Weight:  89.1 kg  BMI:  Body mass index is 25.63 kg/m.  Estimated Nutritional  Needs:   Kcal:  2300-2500 kcal/day  Protein:  110-125 grams/day  Fluid:  >2.0 L/day    Earma Reading, MS, RD, LDN Pager: [phone] Weekend/After Hours: [phone]

## 2017-06-12 NOTE — Progress Notes (Signed)
Patient had external catheter in place draining tea colored urine. External catheter fell off and patient had incontinence episode of large amount of urine. Soaked through bed pad and sheet. Patient bathed, new external catheter applied. Patient tolerated well. Alert to self and year, reoriented to place/situation. Able to state his name, clearer speech this afternoon and laughing with nursing staff. States "I want to try and eat." nursing staff will assist with supper. bedalarm remains on for safety, 3 side rails up, fall matts in place. Call light within reach and reinforced fall prevention/safety education with patient. Earnstine RegalAshley Chantay Whitelock, RN

## 2017-06-12 NOTE — Clinical Social Work Peds Assess (Signed)
Mrs. Luis Yu indicated that [place] and that she wanted to patient placed there. [facility] [place].  LCSW left a message [facility]Kelly [facility] requesting return contact regarding admission.  [facility]. LCSW could not find information on [place].     Bernice Mcauliffe, Juleen ChinaHeather D, LCSW

## 2017-06-12 NOTE — Progress Notes (Signed)
In and out cath of patient not executed on patient at this time as the patient is currently voiding via external cath.  -See I/O

## 2017-06-12 NOTE — Progress Notes (Signed)
In and out cath of patient not executed on patient at this time as the patient is currently voiding via external cath.

## 2017-06-12 NOTE — Care Management Important Message (Signed)
Important Message  Patient Details  Name: Luis ImusDonald W Yu MRN: [id number] Date of Birth: 1946/03/19   Medicare Important Message Given:  Yes    Renie OraHawkins, Alitzel Cookson Smith 06/12/2017, 11:18 AM

## 2017-06-12 NOTE — Progress Notes (Signed)
Progress Note    CONALL VANGORDER  ZOX:[id number] DOB: 11/14/1945  DOA: 05/29/2017 PCP: Shirline Frees, NP    Brief Narrative:   Chief complaint: F/U AMS/dental infection.  Medical records reviewed and are as summarized below:  Luis Yu is an 72 y.o. male with a PMH of PAF on Eliquis, chronic diastolic CHF, vascular dementia, recurrent dental infections who was admitted 05/29/17 with fever secondary to a recurrent dental infection. Despite appropriate therapy, his fevers persisted and further workup indicated evidence of aspiration pneumonia. He was evaluated by speech therapy and found to have dysphasia. He was slowly improving with IV antibiotics but then developed changes in his mental status. Workup revealed new right sided lacunar infarcts. Neurology was subsequently consulted. Hospital course further complicated by increasing agitation/sundowning, acute respiratory failure requiring BiPAP. Psychiatry is also following him for treatment of his agitation/sundowning and he has been placed on Seroquel for symptom management.  Pertinent events:  05/29/17: Admitted for treatment of dental abscess.  05/30/17: Noted to be agitated overnight.  Given Ativan and Haldol.  Ativan subsequently held due to somnolence.  Foley placed for urinary retention. PT evaluated patient and recommended SNF.  05/31/17: Placed on vancomycin and Zosyn for aspiration pneumonia after CT chest performed.  06/01/17: Remains encephalopathic.  Haldol dose decreased.  Required restraints.  06/02/17: Became combative.  CT of the brain ordered.  Placed on BiPAP due to respiratory failure after being  given Ativan/Haldol.  06/03/17: Remains combative/aggressive.  Continues to require restraint at times.  Transferred to the ICU.    Stroke workup initiated/neurology consulted.  Placed on Lovenox due to inability to swallow pills.  2D echo showed EF 40% with basal inferior akinesis and hypokinesis elsewhere.  Carotid Dopplers  showed mild  stenosis on the right and moderate stenosis on the left.  06/04/17: Neurologist evaluated the patient.ST reports oral phase dysphagia, mild aspiration risk. Regular diet/nectar thick liquids. Agitated in the pm, requiring Haldol/Ativan/restraint.  06/05/17: Foley D/C'd. Neurologist increased Seroquel & started Aricept. Remains confused/agitated at times.  06/06/17: Not a CIR candidate. Neurologist feels sundowning symptoms improving.  06/08/17: Verbally aggressive. Refusing meds.  06/09/17: Blood drawn due to scratching employee and screened for HIV. Back in restraints for aggressive/combative behaviors.   06/10/17: TTS reviewed meds and does not recommend changes. Wife requests transfer to hospital in [facility].  [facility] hospital and spoke with Dr. Reyes Ivan who declined transfer, felt patient more appropriate for Rehab.  06/11/17: Agitated again last night. Given Risperdal. Noted to be dyspneic. Normally on Lasix at home, but not taking here. CXR ordered and Lasix 40 mg IV given.  Suspect delirium is being triggered by CHF/dyspnea.  Assessment/Plan:     Acute CVA (cerebrovascular accident)  2 small acute lacunar infarcts in the right hemisphere discovered on MRI 06/03/17 after an evaluation was undertaken due to patient's change in behavior with agitation which was documented on 05/30/17.  He was evaluated by neurology.  Felt to have an embolic stroke in the setting of atrial fibrillation and acute illness with sepsis.  Anticoagulation recommended.    Agitation/encephalopathydelirium. The patient does not have any history of sundowning behavior and I suspect his encephalopathy is from delirium, likely being triggered by dyspnea from CHF. He normally takes Lasix at home but this has not been continued in the hospital. [facility] repeat a chest x-Nabi and give him 40 mg of IV Lasix now. RPR non-reactive 12/05/11. TSH WNL. B12.  Vitamin B12 levels 4667.  Continue  Aricept and Seroquel.  Continues to  get agitated and expressed violent behavior in the evening/night.  Required restraints again last night and was given Risperdal.    DM (diabetes mellitus) type 2 uncontrolled with retinopathy Treated with glipizide and Lantus at home. Currently on moderate scale SSI with 4 units of meal coverage and 15 units of Lantus. CBGs 98-229.  CBG (last 3)  Recent Labs    06/11/17 1658 06/11/17 2149 06/12/17 0800  GLUCAP 95 183* 121*     Hyperlipidemia Continue statin.    OSA (obstructive sleep apnea) Continue BiPAP at bedtime.    Essential hypertension On Bystolic and losartan.    GERD Continue PPI.    Atrial fibrillation (HCC) CHADS2Vasc is 7. On Eliquis.  Rate controlled on Bystolic.    Coronary artery disease involving coronary bypass graft of native heart without angina pectoris Stable although 2 D echo does show decreased systolic function.    Dental infection Had right mandibular swelling on presentation and a CT done at that time showed infection without any drainable abscess.  Initially treated with Unasyn.    Chronic diastolic CHF (congestive heart failure)  Stable on admission.  Treated with Lasix.    Aspiration pneumonia (HCC) Likely from nausea/vomiting in the setting of dental  infection.  Blood cultures done on admission negative.    Protein-calorie malnutrition, severe Encourage PO intake. Body mass index is 25.63 kg/m.    Vascular dementia Continue Aricept.  Acute urinary retention Foley placed 05/29/17.  Flomax subsequently added. Foley subsequently discontinued.   Family Communication/Anticipated D/C date and plan/Code Status   DVT prophylaxis: Eliquis ordered. Code Status: DNR. Family Communication: Wife at bedside. Disposition Plan: Unclear. SNF vs. Discharge home pending improvement and stabilization.   Medical Consultants:    Neurology  Telepsychiatry  Anti-Infectives:    S/P 7 days of Unasyn.  Subjective:   Awake, alert, cooperative.  Confused and disoriented.   Objective:    Vitals:   06/11/17 2127 06/12/17 0500 06/12/17 0838 06/12/17 1401  BP: 130/66 131/75  123/68  Pulse: 80 85  78  Resp: 20 20  20   Temp: 98 F (36.7 C) 98 F (36.7 C)  98.1 F (36.7 C)  TempSrc: Oral Oral  Oral  SpO2: 96% 98% 100% 96%  Weight:      Height:        Intake/Output Summary (Last 24 hours) at 06/12/2017 1553 Last data filed at 06/12/2017 1541 Gross per 24 hour  Intake 840 ml  Output 3225 ml  Net -2385 ml   Filed Weights   06/06/17 0500 06/07/17 0500 06/08/17 0500  Weight: 90.1 kg (198 lb 10.2 oz) 92.7 kg (204 lb 5.9 oz) 93 kg (205 lb 0.4 oz)   Exam: General: No acute distress. Cardiovascular: Heart sounds show a regular rate, and rhythm. No gallops or rubs. No murmurs. No JVD. Lungs: bibasilar crackles.  Abdomen: Soft, nontender, nondistended with normal active bowel sounds. No masses. No hepatosplenomegaly. Neurological: Alert and oriented 1. Moves all extremities 4 with equal strength. Cranial nerves II through XII grossly intact. Skin: Warm and dry. No rashes or lesions. Extremities: No clubbing or cyanosis. No edema. Pedal pulses 2+. Psychiatric: Mood and affect are normal. Insight and judgment are poor.   Data Reviewed:   I have personally reviewed following labs and imaging studies:  Labs: Labs show the following:   Basic Metabolic Panel: Recent Labs  Lab 06/06/17 0516 06/07/17 0435 06/08/17 0450 06/10/17 0541 06/12/17 0513  NA 143  --  144 143 145  K 3.4*  --  3.4* 3.4* 3.8  CL 107  --  108 109 107  CO2 26  --  28 25 27   GLUCOSE 136*  --  170* 181* 144*  BUN 24*  --  28* 24* 24*  CREATININE 0.88 0.85 0.87 0.91 1.12  CALCIUM 8.4*  --  8.4* 8.6* 8.9  MG 1.9  --   --   --   --    GFR Estimated Creatinine Clearance: 71.3 mL/min (by C-G formula based on SCr of 1.12 mg/dL).  CBC: Recent Labs  Lab 06/06/17 0516 06/08/17 0450 06/10/17 0541  WBC 7.7 8.0 8.3  HGB 8.8* 8.7* 8.5*  HCT 29.8*  29.8* 28.6*  MCV 102.1* 102.4* 102.1*  PLT 163 171 177   CBG: Recent Labs  Lab 06/11/17 0835 06/11/17 1110 06/11/17 1658 06/11/17 2149 06/12/17 0800  GLUCAP 142* 141* 95 183* 121*   Microbiology Recent Results (from the past 240 hour(s))  MRSA PCR Screening     Status: None   Collection Time: 06/03/17  4:50 AM  Result Value Ref Range Status   MRSA by PCR NEGATIVE NEGATIVE Final    Comment:        The GeneXpert MRSA Assay (FDA approved for NASAL specimens only), is one component of a comprehensive MRSA colonization surveillance program. It is not intended to diagnose MRSA infection nor to guide or monitor treatment for MRSA infections. Performed at [facility], [address], [place], [place] [address]     Procedures and diagnostic studies:  Dg Chest [facility] 1 View  Result Date: 06/12/2017 CLINICAL DATA:  CHF. History of coronary artery disease and CABG, CHF, diabetes, hypertension, nonsmoker. EXAM: PORTABLE CHEST 1 VIEW COMPARISON:  Portable chest x-[place] of June 11, 2017 FINDINGS: The lungs are adequately inflated. The interstitial markings remain increased and are slightly more conspicuous today. Hazy increased density is developed at the left lung base. The cardiac silhouette is enlarged. The pulmonary vascularity is engorged and less distinct. There are post CABG changes. IMPRESSION: Mild worsening of CHF. There is likely layering of pleural fluid posteriorly on the left. Electronically Signed   By: David  [place] M.D.   On: 06/12/2017 10:09   Dg Chest Port 1 View  Result Date: 06/11/2017 CLINICAL DATA:  Congestive failure EXAM: PORTABLE CHEST 1 VIEW COMPARISON:  05/31/2017 FINDINGS: Cardiac shadow is mildly enlarged. Postsurgical changes are again seen. Mild vascular congestion is noted. No sizable infiltrate or effusion is seen. No bony abnormality is noted. IMPRESSION: Mild congestive failure. Electronically Signed   By: Alcide Clever M.D.   On: 06/11/2017 14:31     Medications:   . apixaban  5 mg Oral BID  . atorvastatin  80 mg Oral QHS  . buPROPion  150 mg Oral BID  . donepezil  5 mg Oral q morning - 10a  . feeding supplement (ENSURE ENLIVE)  237 mL Oral TID BM  . furosemide  40 mg Oral Daily  . insulin aspart  0-15 Units Subcutaneous TID WC  . insulin aspart  4 Units Subcutaneous TID WC  . insulin glargine  15 Units Subcutaneous QHS  . isosorbide mononitrate  30 mg Oral Daily  . losartan  50 mg Oral Daily  . mirtazapine  15 mg Oral QHS  . multivitamin with minerals  1 tablet Oral Daily  . nebivolol  20 mg Oral QHS  . pantoprazole  40 mg Oral BID AC  . QUEtiapine  200 mg Oral  QPM  . senna-docusate  2 tablet Oral BID  . tamsulosin  0.8 mg Oral Daily  . thiamine  100 mg Oral Daily  . vitamin B-12  2,000 mcg Oral QHS   Continuous Infusions:   LOS: 13 days     Delmont Prosch [employer] Pager [phone].   Please refer to [email or web address], password [email or web address] to get updated schedule on who will round on this patient, as hospitalists switch teams weekly. If 7PM-7AM, please contact night-coverage at [email or web address], password [email or web address] for any overnight needs.  06/12/2017, 3:53 PM

## 2017-06-12 NOTE — Consult Note (Signed)
[facility] Consult   Reason for Consult:  agitation Referring Physician:  Kathie Dike, MD Patient Identification: Luis Yu MRN:  [id number] Principal Diagnosis: Acute CVA (cerebrovascular accident) [facility]) Diagnosis:   Patient Active Problem List   Diagnosis Date Noted  . Vascular dementia [F01.50] 06/07/2017  . Protein-calorie malnutrition, severe [E43] 06/06/2017  . Aspiration pneumonia ([place]) [J69.0] 06/01/2017  . Dental infection [K04.7] 05/29/2017  . Chronic diastolic CHF (congestive heart failure) ([place]) [I50.32] 05/29/2017  . Sleep apnea [G47.30]   . Postherpetic neuralgia [B02.29]   . Osteoarthritis [M19.90]   . Obesity [E66.9]   . Hypertension [I10]   . GERD (gastroesophageal reflux disease) [K21.9]   . Erectile dysfunction [N52.9]   . Diabetic peripheral neuropathy ([place]) [E11.42]   . Diabetes mellitus type II, uncontrolled ([place]) [E11.65]   . Depression [F32.9]   . CAD (coronary artery disease) [I25.10]   . Atrial flutter ([place]) [I48.92]   . Adjustment disorder with mixed emotional features [F43.29]   . Frontal lobe and executive function deficit following cerebral infarction [I69.314] 07/02/2016  . Neurologic gait disorder [R26.9]   . Coronary artery disease involving coronary bypass graft of native heart without angina pectoris [I25.810]   . Acute kidney injury ([place]) [N17.9]   . Nausea & vomiting [R11.2] 06/28/2016  . Ischemic stroke of frontal lobe ([place]) [I63.9] 06/28/2016  . Thrombocytopenia ([place]) [D69.6] 06/28/2016  . Renal failure (ARF), acute on chronic (HCC) [N17.9, N18.9] 06/28/2016  . Stroke (cerebrum) ([place]) [I63.9] 06/28/2016  . Acute CVA (cerebrovascular accident) ([place]) [I63.9] 06/28/2016  . Insomnia [G47.00] 02/02/2016  . Atrial fibrillation ([place]) [I48.91] 10/27/2015  . Uncoordinated movements [R27.0] 02/24/2014  . Unspecified hereditary and idiopathic peripheral neuropathy [G60.9] 02/24/2013  . Low back pain [M54.5] 02/24/2013  .  Abnormality of gait [R26.9] 02/09/2013  . Memory loss, short term [R41.3] 12/05/2011  . LUMBAR STRAIN, ACUTE [S33.5XXA] 05/05/2010  . OSA (obstructive sleep apnea) [G47.33] 09/14/2009  . BPH (benign prostatic hyperplasia) [N40.0] 07/27/2009  . CARDIOVASCULAR STUDIES, ABNORMAL [R94.30] 07/27/2009  . CHEST PAIN [R07.9] 06/08/2009  . OSTEOARTHRITIS [M19.90] 05/30/2009  . HEEL PAIN, LEFT [M79.609] 05/24/2009  . Dyspnea on exertion [R06.09] 05/24/2009  . ADJUSTMENT DISORDER WITH MIXED FEATURES [F43.23] 08/25/2008  . Diabetes type 2, uncontrolled ([place]) [E11.65] 02/24/2008  . ERECTILE DYSFUNCTION [F52.8] 02/10/2007  . Hyperlipidemia [E78.5] 10/09/2006  . OBESITY [E66.9] 10/09/2006  . Essential hypertension [I10] 10/09/2006  . Coronary atherosclerosis [I25.10] 10/09/2006  . GERD [K21.9] 10/09/2006  . DM (diabetes mellitus) type II uncontrolled with retinopathy [E11.39, E11.65] 06/10/2006    Total Time spent with patient: 45 minutes  Subjective:   Luis Yu is a 72 y.o. male patient presented to [facility] via EMS on 05/29/17 with complaints of chills, shakes, cough, and a history of tooth abscess with pain and swelling on right side of jaw that started 05/28/17.     HPI: Luis Yu, 72 y.o., male patient seen via face to face by this provider; chart reviewed and consulted with Dr. Dwyane Dee on 06/12/17.  Patient admitted to hospital with diagnosis Sepsis, toxic encephalopathy due to acute right sided dental infection and other medical conditions.  Since admission patient continues to be confused and has had episodes of agitation, aggressiveness, and restlessness.    On evaluation Luis Yu patient in bed sleeping with restraints family and nursing did not want patient to be woken.  Unable to interview patient related to patient being sleep; family and nursing stating patient has not had much  sleep and did not want him to be woken up. Nursing reports that patient is confused; wanting to get out of  the bed; has tried to pull out IV and Foley; and has been aggressive towards staff by hitting. Patient's wife is at the bedside along with a family friend both informed that patient had no psychiatric history; however patient does have a history of a stroke and after his stroke he did have somewhat of a behavioral change where patient would appear happy 1 minute and sullen the next.  Wife of patient states that patient has been in the hospital for 12 days and that he has not had much sleep since he has been here.  Also states that patient gets agitated when he is not allowed to get up out of bed or put his clothes on because he doesn't understand why he can't and he gets upset with staff because they won't let him.  No prior history or aggression, violence, or agitation.   States that patient has had adverse reaction to Ativan which makes the patient more aggressive and restless.  Also states that patient has not reacted well to Haldol; and because he is not being allowed to get out of bed.  Wife understands that patient needs assistance getting out of bed; and that he has some confusion; but she doesn't understand why he is being put in restraints.  She doesn't see it as a safety issue.  Wife states that she is feels patient has gotten worse since he has been in the hospital and that the Ativan and Haldol are making things worse. Medications reviewed; current psychotropic medications at this time are: Seroquel  200 mg nightly  Remeron 15 mg nightly Wellbutrin 150 mg twice daily Haldol 5 mg every 6 hours as needed agitation Spoke with patient's nurse Caryl Pina, RN and Dr. Wynetta Emery.   Inform Dr. Wynetta Emery of wife collateral information that patient has no psychiatric history and that confusion, agitation, and restlessness only started after admission.  Informed confusion is probably related to infection in the agitation and restlessness is probably related to patient not having any sleep and the adverse reaction to  the Ativan.  Recommended reducing bedtime Seroquel and starting Seroquel 25 mg twice daily as needed agitation and stopping Ativan and Haldol.   Past Psychiatric History: None  Risk to Self: Is patient at risk for suicide?: No Risk to Others:   Prior Inpatient Therapy:   Prior Outpatient Therapy:    Past Medical History:  Past Medical History:  Diagnosis Date  . Adjustment disorder with mixed emotional features   . Atrial flutter ([place])   . CAD (coronary artery disease)   . Depression   . Diabetes mellitus type II, uncontrolled ([place])   . Diabetic peripheral neuropathy ([place])   . Erectile dysfunction   . GERD (gastroesophageal reflux disease)   . Herpes zoster   . Hyperlipidemia   . Hypertension   . Obesity   . Osteoarthritis   . Postherpetic neuralgia   . Sleep apnea     Past Surgical History:  Procedure Laterality Date  . CARDIOVERSION N/A 11/10/2015   Procedure: CARDIOVERSION;  Surgeon: Larey Dresser, MD;  Location: [facility] ENDOSCOPY;  Service: Cardiovascular;  Laterality: N/A;  . CARDIOVERSION N/A 02/21/2016   Procedure: CARDIOVERSION;  Surgeon: Lelon Perla, MD;  Location: [facility] ENDOSCOPY;  Service: Cardiovascular;  Laterality: N/A;  . CORONARY ARTERY BYPASS GRAFT  1996   eith a LIMA to the LAD, saphenous vein graft  to the acute marginal, saphenous vein graft to the PDA and saphenous vein graft to the circumflex.  [name] KNEE ARTHROSCOPY  05/2004   right knee  . REPLACEMENT TOTAL KNEE BILATERAL    . SHOULDER SURGERY     Family History:  Family History  Problem Relation Age of Onset  . Coronary artery disease Mother        deceased at 34  . Coronary artery disease Father        died age 52  . Stroke Unknown        half brother  . Stroke Brother    Family Psychiatric  History: Patient wife denies psychiatric history Social History:  Social History   Substance and Sexual Activity  Alcohol Use Yes  . Alcohol/week: 1.2 oz  . Types: 2 Standard drinks or equivalent per  week   Comment: twice a month.     Social History   Substance and Sexual Activity  Drug Use No    Social History   Socioeconomic History  . Marital status: Married    Spouse name: Cathie Beams  . Number of children: 1  . Years of education: 41  . Highest education level: Not on file  Occupational History  . Occupation: Retired Magazine features editor: RETIRED  Social Needs  . Financial resource strain: Not on file  . Food insecurity:    Worry: Not on file    Inability: Not on file  . Transportation needs:    Medical: Not on file    Non-medical: Not on file  Tobacco Use  . Smoking status: Never Smoker  . Smokeless tobacco: Never Used  Substance and Sexual Activity  . Alcohol use: Yes    Alcohol/week: 1.2 oz    Types: 2 Standard drinks or equivalent per week    Comment: twice a month.  . Drug use: No  . Sexual activity: Yes    Partners: Female  Lifestyle  . Physical activity:    Days per week: Not on file    Minutes per session: Not on file  . Stress: Not on file  Relationships  . Social connections:    Talks on phone: Not on file    Gets together: Not on file    Attends religious service: Not on file    Active member of club or organization: Not on file    Attends meetings of clubs or organizations: Not on file    Relationship status: Not on file  Other Topics Concern  . Not on file  Social History Narrative   Married Cathie Beams)   Regular exercise: none   Caffeine use: cup of coffee daily    Retired Administrator   Caffeine- twice daily         Additional Social History:    Allergies:   Allergies  Allergen Reactions  . Lorazepam Other (See Comments)    Makes more agitated and delirious    Labs:  Results for orders placed or performed during the hospital encounter of 05/29/17 (from the past 48 hour(s))  Glucose, capillary     Status: None   Collection Time: 06/10/17  4:02 PM  Result Value Ref Range   Glucose-Capillary 98 65 - 99 mg/dL   Comment 1  Notify RN    Comment 2 Document in Chart   Vitamin B12     Status: Abnormal   Collection Time: 06/10/17  6:40 PM  Result Value Ref Range   Vitamin B-12 4,667 (H) 180 -  914 pg/mL    Comment: (NOTE) This assay is not validated for testing neonatal or myeloproliferative syndrome specimens for Vitamin B12 levels. Performed at [facility], [place] [address], [place], [name] [address]   Glucose, capillary     Status: Abnormal   Collection Time: 06/10/17  8:56 PM  Result Value Ref Range   Glucose-Capillary 154 (H) 65 - 99 mg/dL  Glucose, capillary     Status: Abnormal   Collection Time: 06/11/17  8:35 AM  Result Value Ref Range   Glucose-Capillary 142 (H) 65 - 99 mg/dL  Glucose, capillary     Status: Abnormal   Collection Time: 06/11/17 11:10 AM  Result Value Ref Range   Glucose-Capillary 141 (H) 65 - 99 mg/dL  Glucose, capillary     Status: None   Collection Time: 06/11/17  4:58 PM  Result Value Ref Range   Glucose-Capillary 95 65 - 99 mg/dL  Glucose, capillary     Status: Abnormal   Collection Time: 06/11/17  9:49 PM  Result Value Ref Range   Glucose-Capillary 183 (H) 65 - 99 mg/dL  Basic metabolic panel     Status: Abnormal   Collection Time: 06/12/17  5:13 AM  Result Value Ref Range   Sodium 145 135 - 145 mmol/L   Potassium 3.8 3.5 - 5.1 mmol/L   Chloride 107 101 - 111 mmol/L   CO2 27 22 - 32 mmol/L   Glucose, Bld 144 (H) 65 - 99 mg/dL   BUN 24 (H) 6 - 20 mg/dL   Creatinine, Ser 1.12 0.61 - 1.24 mg/dL   Calcium 8.9 8.9 - 10.3 mg/dL   GFR calc non Af Amer >60 >60 mL/min   GFR calc Af Amer >60 >60 mL/min    Comment: (NOTE) The eGFR has been calculated using the CKD EPI equation. This calculation has not been validated in all clinical situations. eGFR's persistently <60 mL/min signify possible Chronic Kidney Disease.    Anion gap 11 5 - 15    Comment: Performed at [facility], [address] [place], [place] [address]  Glucose, capillary     Status: Abnormal    Collection Time: 06/12/17  8:00 AM  Result Value Ref Range   Glucose-Capillary 121 (H) 65 - 99 mg/dL    Current Facility-Administered Medications  Medication Dose Route Frequency Provider Last Rate Last Dose  . acetaminophen (TYLENOL) tablet 650 mg  650 mg Oral Q6H PRN Kathie Dike, MD   650 mg at 05/31/17 1848   Or  . acetaminophen (TYLENOL) suppository 650 mg  650 mg Rectal Q6H PRN Kathie Dike, MD      . albuterol (PROVENTIL) (2.5 MG/3ML) 0.083% nebulizer solution 2.5 mg  2.5 mg Nebulization Q6H PRN Johnson, Clanford L, MD      . apixaban (ELIQUIS) tablet 5 mg  5 mg Oral BID Johnson, Clanford L, MD   5 mg at 06/12/17 1037  . atorvastatin (LIPITOR) tablet 80 mg  80 mg Oral QHS Kathie Dike, MD   80 mg at 06/11/17 2255  . buPROPion (WELLBUTRIN SR) 12 hr tablet 150 mg  150 mg Oral BID Kathie Dike, MD   150 mg at 06/12/17 1037  . donepezil (ARICEPT) tablet 5 mg  5 mg Oral q morning - 10a [place], Kofi, MD   5 mg at 06/12/17 1037  . feeding supplement (ENSURE ENLIVE) (ENSURE ENLIVE) liquid 237 mL  237 mL Oral TID BM Johnson, Clanford L, MD   237 mL at 06/12/17 1036  .  food thickener (THICK IT) powder   Oral PRN Johnson, Clanford L, MD      . furosemide (LASIX) tablet 40 mg  40 mg Oral Daily Rama, Venetia Maxon, MD   40 mg at 06/12/17 1037  . haloperidol lactate (HALDOL) injection 5 mg  5 mg Intravenous Q6H PRN Johnson, Clanford L, MD   5 mg at 06/11/17 1553  . insulin aspart (novoLOG) injection 0-15 Units  0-15 Units Subcutaneous TID WC Kathie Dike, MD   2 Units at 06/12/17 0836  . insulin aspart (novoLOG) injection 4 Units  4 Units Subcutaneous TID WC Rama, Venetia Maxon, MD   4 Units at 06/11/17 1818  . insulin glargine (LANTUS) injection 15 Units  15 Units Subcutaneous QHS Rama, Venetia Maxon, MD   15 Units at 06/11/17 2255  . isosorbide mononitrate (IMDUR) 24 hr tablet 30 mg  30 mg Oral Daily Kathie Dike, MD   30 mg at 06/12/17 1036  . losartan (COZAAR) tablet 50 mg  50 mg  Oral Daily Kathie Dike, MD   50 mg at 06/12/17 1036  . mirtazapine (REMERON) tablet 15 mg  15 mg Oral QHS Kathie Dike, MD   15 mg at 06/11/17 2255  . multivitamin with minerals tablet 1 tablet  1 tablet Oral Daily Johnson, Clanford L, MD   1 tablet at 06/12/17 1037  . nebivolol (BYSTOLIC) tablet 20 mg  20 mg Oral QHS Kathie Dike, MD   20 mg at 06/11/17 2255  . ondansetron (ZOFRAN) injection 4 mg  4 mg Intravenous Q6H PRN Kathie Dike, MD      . pantoprazole (PROTONIX) EC tablet 40 mg  40 mg Oral BID AC Kathie Dike, MD   40 mg at 06/12/17 1037  . QUEtiapine (SEROQUEL) tablet 150 mg  150 mg Oral QPM Johnson, Clanford L, MD      . QUEtiapine (SEROQUEL) tablet 25 mg  25 mg Oral BID PRN Johnson, Clanford L, MD      . senna-docusate (Senokot-S) tablet 2 tablet  2 tablet Oral BID Wynetta Emery, Clanford L, MD   2 tablet at 06/12/17 1036  . tamsulosin (FLOMAX) capsule 0.8 mg  0.8 mg Oral Daily Rama, Venetia Maxon, MD   0.8 mg at 06/12/17 1036  . thiamine (VITAMIN B-1) tablet 100 mg  100 mg Oral Daily Kathie Dike, MD   100 mg at 06/12/17 1037  . vitamin B-12 (CYANOCOBALAMIN) tablet 2,000 mcg  2,000 mcg Oral QHS Kathie Dike, MD   2,000 mcg at 06/11/17 2255    Musculoskeletal: Strength & Muscle Tone: Unable to assess at this time patient in bed sleeping with four-point restraints Gait & Station: Unable to assess at this time patient in bed sleeping with four-point restraints Patient leans: N/A  Psychiatric Specialty Exam:  Unable to assess at this time patient in bed sleeping with four-point restraints Physical Exam  ROS  Blood pressure 123/68, pulse 78, temperature 98.1 F (36.7 C), temperature source Oral, resp. rate 20, height _0  (1.905 m), weight 93 kg (205 lb 0.4 oz), SpO2 96 %.Body mass index is 25.63 kg/m.    Treatment Plan Summary: Plan Patient psychiatrically cleared and Medication recommendations for agitation   Recommendation: Stop Ativan and Haldol.  Start  Seroquel 25 mg twice daily as needed for agitation decrease Seroquel 200 mg nightly.  Continue Remeron and Wellbutrin.  Patient psychiatrically cleared related to confusion, agitation, and restlessness probably due to infection and not having any sleep.  Also may want to educate wife  and family on purpose of restraints; informing that it safety precautions to keep patient from falling; pulling out his Foley and IV; that it is for the patient safety that he does not harm himself.  Disposition: No evidence of imminent risk to self or others at present.   Patient does not meet criteria for psychiatric inpatient admission.  Shuvon Rankin, NP 06/12/2017 2:11 PM

## 2017-06-13 LAB — GLUCOSE, CAPILLARY
Glucose-Capillary: 285 mg/dL — ABNORMAL HIGH (ref 65–99)
Glucose-Capillary: 78 mg/dL (ref 65–99)

## 2017-06-13 MED ORDER — ENSURE ENLIVE PO LIQD
237.0000 mL | Freq: Two times a day (BID) | ORAL | Status: DC
  Administered 2017-06-13: 237 mL via ORAL

## 2017-06-13 MED ORDER — TUBERCULIN PPD 5 UNIT/0.1ML ID SOLN
5.0000 [IU] | Freq: Once | INTRADERMAL | Status: AC
  Administered 2017-06-13: 5 [IU] via INTRADERMAL
  Filled 2017-06-13: qty 0.1

## 2017-06-13 NOTE — Care Management Note (Signed)
Case Management Note  Patient Details  Name: Luis Yu MRN: [id number] Date of Birth: November 25, 1945  If discussed at Long Length of Stay Meetings, dates discussed:  06/13/17  Additional Comments:  Malcolm Metrohildress, Malkie Wille Demske, RN 06/13/2017, 12:55 PM

## 2017-06-13 NOTE — Progress Notes (Signed)
Family refused CBG this afternoon. Informed the importance of checking patient's CBG, family verbalized understanding, and stated" while is asleep,no CBG"s or blood pressures please." MD notified. Will continue to monitor patient.

## 2017-06-13 NOTE — Progress Notes (Signed)
OT Cancellation Note  Patient Details Name: Luis Yu MRN: [id number] DOB: 1945/07/21   Cancelled Treatment:    Reason Eval/Treat Not Completed: Fatigue/lethargy limiting ability to participate. Nsg tech in room on OT arrival, pt sleeping soundly. Tech reports pt had been up since 4am and had just fallen back asleep. Pt woke up briefly while OT in room, fell back asleep quickly. Will attempt to see pt at another time, possibly later in the day as mornings seem to be difficult for pt to participate.   Ezra SitesLeslie Keena Heesch, OTR/L  [phone] 06/13/2017, 8:38 AM

## 2017-06-13 NOTE — Progress Notes (Signed)
Progress Note    Luis Yu  ZOX:[id number] DOB: [birth year]  DOA: 05/29/2017 PCP: Shirline FreesNafziger, [name], NP    Brief Narrative:   Chief complaint: Follow Up CVA  Medical records reviewed and are as summarized below:  Luis ImusDonald W Yu is an 72 y.o. male with a PMH of PAF on Eliquis, chronic diastolic CHF, vascular dementia, recurrent dental infections who was admitted 05/29/17 with fever secondary to a recurrent dental infection. Despite appropriate therapy, his fevers persisted and further workup indicated evidence of aspiration pneumonia. He was evaluated by speech therapy and found to have dysphasia. He was slowly improving with IV antibiotics but then developed changes in his mental status. Workup revealed new right sided lacunar infarcts. Neurology was subsequently consulted. Hospital course further complicated by increasing agitation/sundowning, acute respiratory failure requiring BiPAP. Psychiatry is also following him for treatment of his agitation/sundowning and he has been placed on Seroquel for symptom management.  Pertinent events:  05/29/17: Admitted for treatment of dental abscess.  05/30/17: Noted to be agitated overnight.  Given Ativan and Haldol.  Ativan subsequently held due to somnolence.  Foley placed for urinary retention. PT evaluated patient and recommended SNF.  05/31/17: Placed on vancomycin and Zosyn for aspiration pneumonia after CT chest performed.  06/01/17: Remains encephalopathic.  Haldol dose decreased.  Required restraints.  06/02/17: Became combative.  CT of the brain ordered.  Placed on BiPAP due to respiratory failure after being  given Ativan/Haldol.  06/03/17: Remains combative/aggressive.  Continues to require restraint at times.  Transferred to the ICU.    Stroke workup initiated/neurology consulted.  Placed on Lovenox due to inability to swallow pills.  2D echo showed EF 40% with basal inferior akinesis and hypokinesis elsewhere.  Carotid Dopplers showed mild    stenosis on the right and moderate stenosis on the left.  06/04/17: Neurologist evaluated the patient.ST reports oral phase dysphagia, mild aspiration risk. Regular diet/nectar thick liquids. Agitated in the pm, requiring Haldol/Ativan/restraint.  06/05/17: Foley D/C'd. Neurologist increased Seroquel & started Aricept. Remains confused/agitated at times.  06/06/17: Not a CIR candidate. Neurologist feels sundowning symptoms improving.  06/08/17: Verbally aggressive. Refusing meds.  06/09/17: Blood drawn due to scratching employee and screened for HIV. Back in restraints for aggressive/combative behaviors.   06/10/17: TTS reviewed meds and does not recommend changes. [facility] Reyes IvanVillanova who declined transfer, felt patient more appropriate for Rehab.  06/11/17: Agitated again last night. Given Risperdal. Noted to be dyspneic. Normally on Lasix at home, but not taking here. CXR ordered and Lasix 40 mg IV given.  Suspect delirium is being triggered by CHF/dyspnea.  06/13/17: PPD placed.  Assessment/Plan:     Acute CVA (cerebrovascular accident)  2 small acute lacunar infarcts in the right hemisphere discovered on MRI 06/03/17 after an evaluation was undertaken due to patient's change in behavior with agitation which was documented on 05/30/17.  He was evaluated by neurology.  Felt to have an embolic stroke in the setting of atrial fibrillation and acute illness with sepsis.  Anticoagulation recommended.    Agitation/encephalopathydelirium. The patient does not have any history of sundowning behavior and I suspect his encephalopathy is from delirium, likely being triggered by dyspnea from CHF. He normally takes Lasix at home but this has not been continued in the hospital. [facility] and give him 40 mg of IV Lasix now. RPR non-reactive 12/05/11. TSH WNL. B12.  Vitamin  B12 levels 4667.  Continue Aricept and Seroquel.   Continues to get agitated and expressed violent behavior in the evening/night.  Added daytime seroquel as needed per behavioral health team.      DM (diabetes mellitus) type 2 uncontrolled with retinopathy Treated with glipizide and Lantus at home. Currently on moderate scale SSI with 4 units of meal coverage and 15 units of Lantus. CBGs 98-229.  CBG (last 3)  Recent Labs    06/12/17 1800 06/12/17 2034 06/13/17 1312  GLUCAP 186* 156* 285*     Hyperlipidemia Continue statin.    OSA (obstructive sleep apnea) Continue BiPAP at bedtime.    Essential hypertension On Bystolic and losartan.    GERD Continue PPI.    Atrial fibrillation (HCC) CHADS2Vasc is 7. On Eliquis.  Rate controlled on Bystolic.    Coronary artery disease involving coronary bypass graft of native heart without angina pectoris Stable although 2 D echo does show decreased systolic function.    Dental infection Had right mandibular swelling on presentation and a CT done at that time showed infection without any drainable abscess.  Initially treated with Unasyn which has completed.     Chronic diastolic CHF (congestive heart failure)  Stable on admission.  Treated with Lasix.    Aspiration pneumonia (HCC) Likely from nausea/vomiting in the setting of dental  infection.  Blood cultures done on admission negative.    Protein-calorie malnutrition, severe Encourage PO intake.  He did drink sure and we ordered for assistance with feeding today.     Vascular dementia Continue Aricept.  Acute urinary retention Foley placed 05/29/17.  Flomax subsequently added. Foley subsequently discontinued.  He is urinating.  We are monitoring this closely.    Family Communication/Anticipated D/C date and plan/Code Status   DVT prophylaxis: Eliquis ordered. Code Status: DNR. Family Communication: Wife at bedside. Disposition Plan: Unclear. SNF vs. Discharge home pending improvement and stabilization.   Medical Consultants:      Neurology  Telepsychiatry  Anti-Infectives:    S/P 7 days of Unasyn.  Subjective:   Awake, alert, cooperative. Confused and disoriented.   Objective:    Vitals:   06/12/17 0838 06/12/17 1401 06/12/17 2219 06/13/17 0522  BP:  123/68 128/78 (!) 130/58  Pulse:  78 64 67  Resp:  20 20 18   Temp:  98.1 F (36.7 C) 98.2 F (36.8 C) 98.4 F (36.9 C)  TempSrc:  Oral Oral Oral  SpO2: 100% 96% 95% 100%  Weight:      Height:        Intake/Output Summary (Last 24 hours) at 06/13/2017 1447 Last data filed at 06/13/2017 0536 Gross per 24 hour  Intake 520 ml  Output 1250 ml  Net -730 ml   Filed Weights   06/06/17 0500 06/07/17 0500 06/08/17 0500  Weight: 90.1 kg (198 lb 10.2 oz) 92.7 kg (204 lb 5.9 oz) 93 kg (205 lb 0.4 oz)   Exam: General: No acute distress. Cardiovascular: Heart sounds show a regular rate, and rhythm. No gallops or rubs. No murmurs. No JVD. Lungs: slight improvement in breath sounds.  BBS with crackles heard.   Abdomen: Soft, nontender, nondistended with normal active bowel sounds. No masses. No hepatosplenomegaly. Neurological: Alert and oriented 1. Moves all extremities 4 with equal strength. Cranial nerves II through XII grossly intact. Skin: Warm and dry. No rashes or lesions. Extremities: No clubbing or cyanosis. No edema. Pedal pulses 2+. Psychiatric: Mood and affect are normal. Insight and judgment are poor.   Data  Reviewed:   I have personally reviewed following labs and imaging studies:  Labs: Labs show the following:   Basic Metabolic Panel: Recent Labs  Lab 06/07/17 0435  06/08/17 0450 06/10/17 0541 06/12/17 0513  NA  --   --  144 143 145  K  --    < > 3.4* 3.4* 3.8  CL  --   --  108 109 107  CO2  --   --  28 25 27   GLUCOSE  --   --  170* 181* 144*  BUN  --   --  28* 24* 24*  CREATININE 0.85  --  0.87 0.91 1.12  CALCIUM  --   --  8.4* 8.6* 8.9   < > = values in this interval not displayed.   GFR Estimated Creatinine  Clearance: 71.3 mL/min (by C-G formula based on SCr of 1.12 mg/dL).  CBC: Recent Labs  Lab 06/08/17 0450 06/10/17 0541  WBC 8.0 8.3  HGB 8.7* 8.5*  HCT 29.8* 28.6*  MCV 102.4* 102.1*  PLT 171 177   CBG: Recent Labs  Lab 06/11/17 2149 06/12/17 0800 06/12/17 1800 06/12/17 2034 06/13/17 1312  GLUCAP 183* 121* 186* 156* 285*   Microbiology No results found for this or any previous visit (from the past 240 hour(s)).  Procedures and diagnostic studies:  Dg Chest Port 1 View  Result Date: 06/12/2017 CLINICAL DATA:  CHF. History of coronary artery disease and CABG, CHF, diabetes, hypertension, nonsmoker. EXAM: PORTABLE CHEST 1 VIEW COMPARISON:  Portable chest x-[place] of June 11, 2017 FINDINGS: The lungs are adequately inflated. The interstitial markings remain increased and are slightly more conspicuous today. Hazy increased density is developed at the left lung base. The cardiac silhouette is enlarged. The pulmonary vascularity is engorged and less distinct. There are post CABG changes. IMPRESSION: Mild worsening of CHF. There is likely layering of pleural fluid posteriorly on the left. Electronically Signed   By: David  [place] M.D.   On: 06/12/2017 10:09    Medications:   . apixaban  5 mg Oral BID  . atorvastatin  80 mg Oral QHS  . buPROPion  150 mg Oral BID  . donepezil  5 mg Oral q morning - 10a  . feeding supplement (ENSURE ENLIVE)  237 mL Oral BID BM  . furosemide  40 mg Oral Daily  . insulin aspart  0-15 Units Subcutaneous TID WC  . insulin aspart  4 Units Subcutaneous TID WC  . insulin glargine  15 Units Subcutaneous QHS  . isosorbide mononitrate  30 mg Oral Daily  . losartan  50 mg Oral Daily  . mirtazapine  15 mg Oral QHS  . multivitamin with minerals  1 tablet Oral Daily  . nebivolol  20 mg Oral QHS  . pantoprazole  40 mg Oral BID AC  . QUEtiapine  200 mg Oral QPM  . senna-docusate  2 tablet Oral BID  . tamsulosin  0.8 mg Oral Daily  . thiamine  100 mg Oral  Daily  . tuberculin  5 Units Intradermal Once  . vitamin B-12  2,000 mcg Oral QHS   Continuous Infusions:   LOS: 14 days     Kaeleigh Westendorf [employer] Pager [phone].   Please refer to [email or web address], password [email or web address] to get updated schedule on who will round on this patient, as hospitalists switch teams weekly. If 7PM-7AM, please contact night-coverage at [email or web address], password [email or web address] for any overnight needs.  06/13/2017, 2:47 PM

## 2017-06-13 NOTE — Clinical Social Work Note (Signed)
CSW following. LCSW Tretha SciaraHeather Settle working on referrals [facility] at [facility]. [facility] skin test to be placed and read as negative before they can admit a pt. Dr. Laural BenesJohnson stated he would order this today so that if we find a place, this issue will have been addressed.  CSW will continue to follow for dc planning.

## 2017-06-13 NOTE — Progress Notes (Signed)
Physical Therapy Treatment Patient Details Name: Luis Yu MRN: [id number] DOB: Feb 11, 1946 Today's Date: 06/13/2017    History of Present Illness Luis Yu is a 72 y.o. male s/p acute CVA after admission with medical history significant of paroxysmal atrial fibrillation on Eliquis, chronic diastolic congestive heart failure, recurrent dental infections, presented to the hospital with fever.  Patient has had a prior stroke and has some residual cognitive effects.  History is mostly obtained from his wife.  She reports that he was in his usual state of health the day before going to hospital. The morning he went to the hospital he woke up confused, having chills.  He was noted to be trembling and shaking.  He began coughing.  No vomiting or diarrhea.  He was noted to be lethargic/confused.  He was brought to the emergency room for evaluation where he was noted to be mildly hypotensive/tachycardic.  He received IV fluids with correction of hemodynamics.  He was noted to be febrile with a temperature of 102.  He received Tylenol with improvement of fever.  Imaging has shown evidence of right sided dental infection    PT Comments    Patient presents lethargic possibly due to sedation, declined to attempt sitting up at bedside, limited to a few active assisted BLE ROM exercises in bed and required Mod/max assist to scoot up in bed.  Patient will benefit from continued physical therapy in hospital and recommended venue below to increase strength, balance, endurance for safe ADLs and gait.   Follow Up Recommendations  SNF     Equipment Recommendations  None recommended by PT    Recommendations for Other Services       Precautions / Restrictions Precautions Precautions: Fall Restrictions Weight Bearing Restrictions: No    Mobility  Bed Mobility Overal bed mobility: Needs Assistance             General bed mobility comments: declined sitting up, Mod/max assist scooting up in  bed  Transfers                    Ambulation/Gait                 Stairs            Wheelchair Mobility    Modified Rankin (Stroke Patients Only)       Balance                                            Cognition Arousal/Alertness: Awake/alert Behavior During Therapy: WFL for tasks assessed/performed Overall Cognitive Status: Impaired/Different from baseline Area of Impairment: Following commands;Safety/judgement                       Following Commands: Follows one step commands inconsistently              Exercises General Exercises - Lower Extremity Heel Slides: Supine;AAROM;Strengthening;Both;10 reps Hip ABduction/ADduction: Supine;AAROM;Strengthening;Both;10 reps    General Comments        Pertinent Vitals/Pain Pain Assessment: Faces Faces Pain Scale: Hurts even more Pain Location: IV insertion in right hand Pain Descriptors / Indicators: Guarding;Grimacing;Jabbing Pain Intervention(s): Limited activity within patient's tolerance;Monitored during session    Home Living                      Prior Function  PT Goals (current goals can now be found in the care plan section) Acute Rehab PT Goals Patient Stated Goal: return home after rehab PT Goal Formulation: With patient/family Time For Goal Achievement: 06/26/17 Potential to Achieve Goals: Fair Progress towards PT goals: Not progressing toward goals - comment(due to lethargy)    Frequency    Min 6X/week      PT Plan Current plan remains appropriate    Co-evaluation              AM-PAC PT "6 Clicks" Daily Activity  Outcome Measure  Difficulty turning over in bed (including adjusting bedclothes, sheets and blankets)?: A Lot Difficulty moving from lying on back to sitting on the side of the bed? : A Lot Difficulty sitting down on and standing up from a chair with arms (e.g., wheelchair, bedside commode, etc,.)?:  Unable Help needed moving to and from a bed to chair (including a wheelchair)?: Total Help needed walking in hospital room?: Total Help needed climbing 3-5 steps with a railing? : Total 6 Click Score: 8    End of Session   Activity Tolerance: Patient limited by fatigue;Patient limited by lethargy Patient left: in bed;with call bell/phone within reach;with bed alarm set Nurse Communication: Mobility status PT Visit Diagnosis: Unsteadiness on feet (R26.81);Other abnormalities of gait and mobility (R26.89);Muscle weakness (generalized) (M62.81)     Time: [id number] PT Time Calculation (min) (ACUTE ONLY): 14 min  Charges:  $Therapeutic Exercise: 8-22 mins                    G Codes:       3:45 PM, 06/13/17 Ocie BobJames Marsia Cino, MPT Physical Therapist with [facility] [phone] office [phone] mobile phone

## 2017-06-13 NOTE — Progress Notes (Signed)
Nutrition Follow-up  DOCUMENTATION CODES:   Severe malnutrition in context of acute illness/injury  INTERVENTION:   - Spoke with Dr. Laural Benes who verbally approved changing fluid consistency back to Nectar Thick on diet order  - Continue Magic cup TID with meals, each supplement provides 290 kcal and 9 grams of protein (pt prefers orange flavor)  - Decreased order for Ensure Enlive from TID to BID given improvement in pt's PO intake, each supplement provides 350 kcal and 20 grams of protein  - Encourage PO intake  - Continue with feeding assistance  NUTRITION DIAGNOSIS:   Severe Malnutrition related to acute illness(severe toxic encephalopathy with delirium) as evidenced by mild muscle depletion, percent weight loss, energy intake < or equal to 50% for > or equal to 5 days.  Ongoing  GOAL:   Patient will meet greater than or equal to 90% of their needs  Improving  MONITOR:   PO intake, Supplement acceptance, I & O's, Weight trends  REASON FOR ASSESSMENT:   Malnutrition Screening Tool    ASSESSMENT:   72 year old male with PMH significant for atrial fibrillation, CHF, GERD, HLD, HTN, and DM type 2. Admitted 05/29/17 for severe toxic encephalopathy with delirium. Pt with increased agitation and delirium through hospital admission and was transferred to ICU.  Spoke with pt, wife, and friend at bedside. Pt eating brownie from lunch tray at time of visit. Pt states he enjoys the [employer] and desserts. Pt's wife reports that she has been bringing some items from home like egg salad and grits as pt was not eating from low sodium meal trays. Diet has been liberalized to regular. PO intake is improving.  Pt's wife states she noticed liquids on lunch tray were not nectar thick. Spoke with Dr. Laural Benes who approved changing diet order to reflect SLP recommendations.  Pt has not enjoyed Ensure as much as Biochemist, clinical. Will decrease frequency to BID.  RD able to perform NFPE at this  time as pt was awake and alert. Noted pt's weight is slowly increasing and is approaching admission weight.  Medications reviewed and include: Lasix (refused), sliding scale Novolog, 15 units Lantus, MVI with minerals (refused), Protonix (refused), Senokot-S (refused), thiamine (refused)  Labs reviewed: BUN 24 CBG's: 156, 186 x 24 hours  NUTRITION - FOCUSED PHYSICAL EXAM:    Most Recent Value  Orbital Region  No depletion  Upper Arm Region  No depletion  Thoracic and Lumbar Region  Mild depletion  Buccal Region  No depletion  Temple Region  Mild depletion  Clavicle Bone Region  Unable to assess  Clavicle and Acromion Bone Region  Mild depletion  Scapular Bone Region  Mild depletion  Dorsal Hand  No depletion  Patellar Region  Mild depletion  Anterior Thigh Region  No depletion  Posterior Calf Region  No depletion  Edema (RD Assessment)  None  Hair  Reviewed  Eyes  Reviewed  Mouth  Reviewed  Skin  Reviewed  Nails  Reviewed       Diet Order:  Diet regular Room service appropriate? Yes; Fluid consistency: Nectar Thick  EDUCATION NEEDS:   No education needs have been identified at this time  Skin:  Skin Assessment: Reviewed RN Assessment  Last BM:  06/11/17 medium type 6  Height:   Ht Readings from Last 1 Encounters:  06/03/17 6\' 3"  (1.905 m)    Weight:   Wt Readings from Last 1 Encounters:  06/08/17 205 lb 0.4 oz (93 kg)    Ideal Body  Weight:  89.1 kg  BMI:  Body mass index is 25.63 kg/m.  Estimated Nutritional Needs:   Kcal:  2300-2500 kcal/day  Protein:  110-125 grams/day  Fluid:  >2.0 L/day    Earma ReadingKate Jablonski Reginaldo Hazard, MS, RD, LDN Pager: [phone] Weekend/After Hours: [phone]

## 2017-06-14 LAB — GLUCOSE, CAPILLARY
Glucose-Capillary: 244 mg/dL — ABNORMAL HIGH (ref 65–99)
Glucose-Capillary: 75 mg/dL (ref 65–99)
Glucose-Capillary: 83 mg/dL (ref 65–99)

## 2017-06-14 LAB — CREATININE, SERUM
Creatinine, Ser: 0.91 mg/dL (ref 0.61–1.24)
GFR calc Af Amer: >60 mL/min (ref >60)
GFR calc non Af Amer: >60 mL/min (ref >60)

## 2017-06-14 NOTE — Progress Notes (Signed)
Pt given breakfast tray

## 2017-06-14 NOTE — Care Management Note (Signed)
Case Management Note  Patient Details  Name: Luis Yu MRN: [id number] Date of Birth: 1945/09/11  Expected Discharge Date:     06/15/17             Expected Discharge Plan:  Home w Home Health Services  In-House Referral:  Clinical Social Work  Discharge planning Services  CM Consult  Post Acute Care Choice:  Home Health Choice offered to:  Spouse  HH Arranged:  RN, PT HH [facility]  [facility]  Status of Service:  Completed, signed off  If discussed at Long Length of Stay Meetings, dates discussed:    Additional Comments: [facility]. [place], [place] [id number] They have [facility] (p. [phone]; [phone]). CM faxed pt info and called office to make referral. They will contact pt tomorrow and will make appointment to see pt Sunday or Monday. CM has called and made pt appoiintment with Dr. Jeanella CaraHuberty p.[phone] at [facility] [address] office for 07/15/17. Per pt HH rep has told family they will make pt appointment with another MD for Monday Morning. CM has asked wife to cancel appointment with [facility] if they make another appointment.     Malcolm Metrohildress, Arsenia Goracke Demske, RN 06/14/2017, 4:58 PM

## 2017-06-14 NOTE — Care Management (Signed)
Patient Information   Patient Name Chai, Routh ([id number]) Sex Male DOB 1945/08/11  Room Bed  A340 A340-01  Patient Demographics   Address [address] [name] [address] Phone [phone] (Home) [phone] (Mobile) *Preferred* E-mail Address [email or web address]  Patient Ethnicity & Race   Ethnic Group Patient Race  Not Hispanic or Latino White or Caucasian  Emergency Contact(s)   Name Relation Home Work Mobile  Cupples,[name] Spouse [phone]  [phone]  Documents on File    Status Date Received Description  Documents for the Patient  EMR Medication Summary Not Received    EMR Problem Summary Not Received    EMR Immunization Summary Not Received    EMR Patient Summary Not Received    [place] E-Signature HIPAA Notice of Privacy Spanish Received 05/03/10   Advance Directives/Living Will/HCPOA/POA Not Received    Driver's License Not Received  EXPIRED  Historic Radiology Documentation Not Received    Historic Radiology Documentation Not Received    AMB Correspondence Not Received  SLEEP 03/12 [facility] Sleep [facility]   AMB Correspondence Not Received  Auth 04/12 [facility]  AMB Correspondence Not Received  OPHT 06/12 [facility]  AMB Provider Completed Forms Not Received  06/12 [facility] [place]  HIM ROI Authorization Not Received    Financial Application Not Received    AMB HH/NH/Hospice Not Received  09/12 [facility]  AMB HH/NH/Hospice Not Received  04/13 [facility]  AMB Correspondence Not Received  07/13 Opht [facility]  AMB Correspondence Not Received  08/13 opht [facility]  HIM ROI Authorization Not Received    AMB Correspondence Not Received  08/13 opht [facility]  HIM ROI Authorization Not Received  [facility]  AMB Provider Completed Forms Not Received  11/13 Rx Auth [facility]  Release of Information Received 01/28/12 [facility]  AMB Provider  Completed Forms Not Received  order [facility]  AMB Provider Completed Forms Not Received  01/14 Phys order [facility]   AMB Correspondence Not Received  01/14 Statement Certifying Phy  [place] E-Signature HIPAA Notice of Privacy Received 11/19/13   AMB Correspondence Not Received  09/13 order [facility]  HIM ROI Authorization Not Received  [employer]  Release of Information Received 08/08/12 [employer]  HIM ROI Authorization Not Received  [facility]  Release of Information Received 08/25/12 [facility]  AMB Provider Completed Forms Not Received  06/14 order [facility]  AMB Provider Completed Forms Not Received  07/14 phsy order [facility]  Insurance Card Received 02/09/13 OLD CARD  HIM ROI Authorization Not Received    AMB Outside Hospital Record Not Received  06/14 d/s [facility]  AMB Outside Hospital Record Not Received  06/14 ed rpt [facility]  [place] HIPAA NOTICE OF PRIVACY - Scanned Not Received    Advanced Beneficiary Notice (ABN) Not Received    Insurance Card Received 04/24/13 OLD CARD  Insurance Card Received 10/22/14 OLD CARD  AMB Correspondence  05/04/13 02/15 Rx [facility]  AMB Outside Hospital Record  06/04/13 03/15 ED [facility]  AMB Correspondence  07/03/13 04/15 ophth [facility]  AMB Correspondence  08/06/13 05/15 OFFICENOTES [facility]  AMB Correspondence  08/07/13 RE-EVALUATION [facility]  AMB Correspondence  08/07/13 POC [facility]  AMB Correspondence  10/23/13 POC [facility]  AMB Correspondence  09/12/13 05/15 [facility]OC [facility]  AMB Correspondence  07/17/13 POC [facility] [place]  HIM ROI Authorization  11/04/13 [facility]/CMS Di[facility] Supplies DOS 06/18/2013  E-Signature AOB Spanish Not Received    Driver's License   EXPIRED  AMB Correspondence  12/14/13  OFFICE NOTE [facility]  AMB Correspondence  12/14/13 [facility]ICE [facility]  AMB Correspondence  12/28/13 LETTER [facility]  AMB Correspondence  01/07/14 LETTER [facility]  AMB Correspondence  11/12/13 [facility]  HIM ROI Authorization  01/18/14 [facility]:BRF:[id number]  AMB Correspondence  01/08/14 LETTER [employer]  HIM ROI Authorization (Expired) 04/06/14 [facility]  AMB Correspondence  04/22/14 RX [facility]  AMB Correspondence  44/03/206/27/15 CERT [facility]  AMB Correspondence  02/24/14 07/11-12/14 OFFICE NOTE [facility]  Insurance Card   OLD CARD  [place] HIPAA NOTICE OF PRIVACY - Scanned   301  HIM ROI Authorization (Expired) 05/20/14 [facility]  AMB Correspondence  11/19/13 BILLING POLICY [facility]  AMB Intake Forms/Questionnaires  06/11/14   Other Photo ID Not Received    AMB Correspondence  07/15/14 APPROVAL SILVER SCRIPT  AMB Correspondence  07/15/14 NOTICE OF DENIAL SILVER SCRIPT  AMB Correspondence  08/19/14 REQUEST SILVER SCRIPT  AMB Outside Consult Note  09/01/14 [facility]  AMB Correspondence  06/01/15 RX FOR TOPICAL PAIN [facility]  HIM ROI Authorization  08/16/15 CONTINUITY OF CARE  Driver's License Received 04/19/17 [facility]--SCDL  Insurance Card   OLD CARD  AMB Outside Hospital Record  08/14/15 D/C INSTRUCTIONS [facility]  Release of Information Received 10/31/15 chmgh/nl/ttf        DPR  Insurance Card   OLD CARD  AMB Intake Forms/Questionnaires  11/11/15   AMB Outside Consult Note  08/14/15 [facility]  AMB Outside Hospital Record  08/13/15 H&P [facility]  AMB Outside Hospital Record  08/15/15 D/S [facility]  AMB HH/NH/Hospice  11/15/15 [facility]  AMB Correspondence  12/29/15 STATEMENT CERT THERAPEUTIC SHOES [facility]   AMB Provider Completed Forms  01/02/16 LETTER [facility]  Insurance Card Received 03/22/16 OLD CARD  AMB HH/NH/Hospice  03/23/16 [facility]  [place] E-Signature HIPAA Notice of Privacy Received 06/27/16   Release of Information Received 07/16/16 [facility]  Release of Information  07/18/16   Consent Form  08/07/16 [facility] 08/02/16  AMB Provider Completed Forms  08/02/16 LETTER/STATEMENT FROM PHYS [facility]  AMB Correspondence  09/06/16 LETTER/CERTIFYING STATEMENT [facility]  AMB Correspondence  11/01/16 STATEMENT FOR CERTIFYING PHYS THERAPEAUTIC SHOES T  AMB Provider Completed Forms  11/30/16 IMMUNIZATION/VACCINE Myra RudeDERS NAFZIGER NP, C  Insurance Card Received 04/19/17 [facility]--MEDIACRE PRIMARY INS  Advanced Beneficiary Notice (ABN) Received 02/11/17 [facility]-ABN MEDICARE 2018  AMB HH/NH/Hospice Received 03/18/17 ORDER [facility]  Insurance Card Received 04/19/17 [facility]--[employer] [place] SECONDARY INS  AMB Correspondence Received 05/27/17 RX [facility]  AMB Provider Completed Forms Received 05/27/17 STATEMENT CERTIFYING PHYSICIAN [facility]  [place] E-Signature HIPAA Notice of Privacy Signed 05/29/17   [place] HIPAA NOTICE OF PRIVACY - Scanned Not Received (Deleted) 05/05/14 315  [place] E-Signature HIPAA Notice of Privacy Received (Deleted) 04/19/10   Driver's License Not Received (Deleted)    Driver's License (Deleted) 08/12/09   Release of Information Not Received (Deleted)    Insurance Card  Received (Deleted) 05/11/11   AMB Intake Forms/Questionnaires (Deleted) 06/13/10 SLEEP 03/12 [facility] Sleep [facility]   Release of Information Not Received (Deleted)    AMB HH/NH/Hospice (Deleted) 11/28/10   Insurance Card Not Received (Deleted)    Release of Information Not Received (Deleted)    AMB HH/NH/Hospice (Deleted) 96/04/54 09/81 Cert [facility]  Release of Information Not Received (Deleted)    Release of Information  Not Received (Deleted)    Insurance Card Not Received (Deleted)    Insurance Card Not Received (Deleted)    Insurance Card Not Received (Deleted)  315  Insurance Card Not Received (Deleted)    Insurance Card Not Received (Deleted)    Release of Information Not Received (Deleted)    Release of Information Not Received (Deleted)    Insurance Card Not Received (Deleted)    Insurance Card Not Received (Deleted)  BCBS.MCR  GNA  Insurance Card Not Received (Deleted)    Release of Information (Deleted) 09/08/13   AMB Correspondence (Deleted) 10/26/13 05/15 POC [facility]  AMB Correspondence (Deleted) 02/24/14 PROGRESS NOTE [facility]  AMB Correspondence (Deleted) 02/24/14 PROGRESS NOTE [facility]  Documents for the Encounter  AOB (Assignment of Insurance Benefits) Not Received    E-signature AOB Signed 05/29/17   MEDICARE RIGHTS Not Received    E-signature Medicare Rights Signed 05/29/17   ED Patient Billing Extract   ED PB Billing Extract  Cardiac Monitoring Strip Shift Summary Received 05/29/17   Cardiac Monitoring Strip Received 06/02/17   Ultrasound Received 06/03/17   Discharge Attachment   Ischemic Stroke Easy-to-Read (English)  Discharge Attachment   Stroke Prevention Easy-to-Read (English)  EKG Received 05/29/17   Study Attachment for Report   External Report  Admission Information   Attending Provider Admitting Provider Admission Type Admission Date/Time  Cleora Fleet, MD Erick Blinks, MD Emergency 05/29/17 [phone]  Discharge Date Hospital Service Auth/Cert Status Service Area   Internal Medicine Incomplete [facility]  Unit Room/Bed Admission Status   AP-DEPT 300 A340/A340-01 Admission (Confirmed)   Admission   Complaint  AMS  Hospital Account   Name Acct ID Class Status Primary Coverage  Kore, Madlock [id number] Inpatient Open MEDICARE - MEDICARE PART A AND B      Guarantor Account (for Hospital  Account [id number])   Name Relation to Pt Service Area Active? Acct Type  Doretha Imus Self CHSA Yes Personal/Family  Address Phone    [address] [place], [place] [address] [phone](H)        Coverage Information (for Hospital Account [id number])   1. MEDICARE/MEDICARE PART A AND B   F/O Payor/Plan Precert #  MEDICARE/MEDICARE PART A AND B   Subscriber Subscriber #  Shlomo, Seres [id number]  Address Phone  [address] [place], [place] [address]   2. [employer] [place] OF [facility] MCR SUP   F/O Payor/Plan Precert #  MUTUAL OF [facility] OF [facility] SUP   Subscriber Subscriber #  Vanna, Shavers [id number]  Address Phone  [address] [place], [place] [address] [phone]

## 2017-06-14 NOTE — Progress Notes (Signed)
Progress Note    Luis Yu  MWU:[id number] DOB: 1945/05/27  DOA: 05/29/2017 PCP: Shirline Frees, NP    Brief Narrative:   Chief complaint: Follow Up CVA  Medical records reviewed and are as summarized below:  Luis Yu is an 72 y.o. male with a PMH of PAF on Eliquis, chronic diastolic CHF, vascular dementia, recurrent dental infections who was admitted 05/29/17 with fever secondary to a recurrent dental infection. Despite appropriate therapy, his fevers persisted and further workup indicated evidence of aspiration pneumonia. He was evaluated by speech therapy and found to have dysphasia. He was slowly improving with IV antibiotics but then developed changes in his mental status. Workup revealed new right sided lacunar infarcts. Neurology was subsequently consulted. Hospital course further complicated by increasing agitation/sundowning, acute respiratory failure requiring BiPAP. Psychiatry is also following him for treatment of his agitation/sundowning and he has been placed on Seroquel for symptom management.  Pertinent events:  05/29/17: Admitted for treatment of dental abscess.  05/30/17: Noted to be agitated overnight.  Given Ativan and Haldol.  Ativan subsequently held due to somnolence.  Foley placed for urinary retention. PT evaluated patient and recommended SNF.  05/31/17: Placed on vancomycin and Zosyn for aspiration pneumonia after CT chest performed.  06/01/17: Remains encephalopathic.  Haldol dose decreased.  Required restraints.  06/02/17: Became combative.  CT of the brain ordered.  Placed on BiPAP due to respiratory failure after being  given Ativan/Haldol.  06/03/17: Remains combative/aggressive.  Continues to require restraint at times.  Transferred to the ICU.    Stroke workup initiated/neurology consulted.  Placed on Lovenox due to inability to swallow pills.  2D echo showed EF 40% with basal inferior akinesis and hypokinesis elsewhere.  Carotid Dopplers showed mild    stenosis on the right and moderate stenosis on the left.  06/04/17: Neurologist evaluated the patient.ST reports oral phase dysphagia, mild aspiration risk. Regular diet/nectar thick liquids. Agitated in the pm, requiring Haldol/Ativan/restraint.  06/05/17: Foley D/C'd. Neurologist increased Seroquel & started Aricept. Remains confused/agitated at times.  06/06/17: Not a CIR candidate. Neurologist feels sundowning symptoms improving.  06/08/17: Verbally aggressive. Refusing meds.  06/09/17: Blood drawn due to scratching employee and screened for HIV. Back in restraints for aggressive/combative behaviors.   06/10/17: TTS reviewed meds and does not recommend changes. Wife requests transfer to [facility].  [facility] hospital and spoke with Dr. Reyes Ivan who declined transfer, felt patient more appropriate for Rehab.  06/11/17: Agitated again last night. Given Risperdal. Noted to be dyspneic. Normally on Lasix at home, but not taking here. CXR ordered and Lasix 40 mg IV given.  Suspect delirium is being triggered by CHF/dyspnea.  06/13/17: PPD placed.  06/14/17: off restraints x 48 hours, sitting up in chair, ambulating more with PT.  Assessment/Plan:     Acute CVA (cerebrovascular accident)  2 small acute lacunar infarcts in the right hemisphere discovered on MRI 06/03/17 after an evaluation was undertaken due to patient's change in behavior with agitation which was documented on 05/30/17.  He was evaluated by neurology.  Felt to have an embolic stroke in the setting of atrial fibrillation and acute illness with sepsis.  Anticoagulation recommended.    Agitation/encephalopathydelirium. The patient does not have any history of sundowning behavior and I suspect his encephalopathy is from delirium, likely being triggered by dyspnea from CHF. He normally takes Lasix at home but this has not been continued in the hospital. [facility] repeat a chest x-Zogg and give  him 40 mg of IV Lasix now. RPR  non-reactive 12/05/11. TSH WNL. B12.  Vitamin B12 levels 4667.  Continue Aricept and Seroquel.  Added daytime seroquel as needed per behavioral health team.  Overall he seems to be improving.      DM (diabetes mellitus) type 2 uncontrolled with retinopathy Treated with glipizide and Lantus at home. Currently on moderate scale SSI with 4 units of meal coverage and 15 units of Lantus.  Unfortunately family had been refusing insulin, he did not receive any lantus yesterday and blood sugars are up this morning.   CBG (last 3)  Recent Labs    06/13/17 1312 06/13/17 2109 06/14/17 0741  GLUCAP 285* 78 244*     Hyperlipidemia Continue statin.    OSA (obstructive sleep apnea) Continue BiPAP at bedtime.    Essential hypertension On Bystolic and losartan.  His blood pressures have been well controlled.     GERD Continue PPI.    Atrial fibrillation CHADS2Vasc is 7. On Eliquis.  Rate controlled on Bystolic.    Coronary artery disease involving coronary bypass graft of native heart without angina pectoris Stable although 2 D echo does show decreased systolic function.    Dental infection Had right mandibular swelling on presentation and a CT done at that time showed infection without any drainable abscess.  Initially treated with Unasyn which has completed.     Chronic systolic CHF (congestive heart failure)  Continue to treat with Lasix.    Aspiration pneumonia  Likely from nausea/vomiting in the setting of dental  infection.  Blood cultures done on admission negative.  Treated with IV unasyn.     Protein-calorie malnutrition, severe Encourage PO intake.  He is eating a little better.  He does not like hospital food and refuses at times.     Vascular dementia Continue Aricept and seroquel.   Acute urinary retention Foley placed 05/29/17.  Flomax subsequently added. Foley subsequently discontinued.  He is urinating.  We are monitoring this closely.    Family Communication/Anticipated  D/C date and plan/Code Status   DVT prophylaxis: Eliquis ordered. Code Status: DNR. Family Communication: Wife at bedside. Disposition Plan: Unclear. SNF vs. Discharge home   Medical Consultants:    Neurology  Telepsychiatry  Anti-Infectives:    completed 7 days of Unasyn.  Subjective:   Awake, alert, cooperative. Pt is sitting up in a chair today.     Objective:    Vitals:   06/13/17 1721 06/13/17 2106 06/14/17 0538 06/14/17 0831  BP:  120/77 126/66   Pulse:  68 64   Resp:  18 20   Temp:  98.1 F (36.7 C) (!) 97.5 F (36.4 C)   TempSrc:  Oral Oral   SpO2: 96% 99% 98% 96%  Weight:      Height:        Intake/Output Summary (Last 24 hours) at 06/14/2017 1100 Last data filed at 06/14/2017 0900 Gross per 24 hour  Intake 660 ml  Output 1000 ml  Net -340 ml   Filed Weights   06/06/17 0500 06/07/17 0500 06/08/17 0500  Weight: 90.1 kg (198 lb 10.2 oz) 92.7 kg (204 lb 5.9 oz) 93 kg (205 lb 0.4 oz)   Exam: General: No acute distress. Cardiovascular: Heart sounds show a regular rate, and rhythm. No gallops or rubs. No murmurs. No JVD. Lungs: slight improvement in breath sounds.  BBS with crackles heard.   Abdomen: Soft, nontender, nondistended with normal active bowel sounds. No masses. No hepatosplenomegaly. Neurological: Alert  and oriented 1. Moves all extremities 4 with equal strength. Cranial nerves II through XII grossly intact. Skin: Warm and dry. No rashes or lesions. Extremities: No clubbing or cyanosis. No edema. Pedal pulses 2+. Psychiatric: Mood and affect are normal. Insight and judgment are poor.   Data Reviewed:   I have personally reviewed following labs and imaging studies:  Labs: Labs show the following:   Basic Metabolic Panel: Recent Labs  Lab 06/08/17 0450 06/10/17 0541 06/12/17 0513 06/14/17 0435  NA 144 143 145  --   K 3.4* 3.4* 3.8  --   CL 108 109 107  --   CO2 28 25 27   --   GLUCOSE 170* 181* 144*  --   BUN 28* 24* 24*   --   CREATININE 0.87 0.91 1.12 0.91  CALCIUM 8.4* 8.6* 8.9  --    GFR Estimated Creatinine Clearance: 86.4 mL/min (by C-G formula based on SCr of 0.91 mg/dL).  CBC: Recent Labs  Lab 06/08/17 0450 06/10/17 0541  WBC 8.0 8.3  HGB 8.7* 8.5*  HCT 29.8* 28.6*  MCV 102.4* 102.1*  PLT 171 177   CBG: Recent Labs  Lab 06/12/17 1800 06/12/17 2034 06/13/17 1312 06/13/17 2109 06/14/17 0741  GLUCAP 186* 156* 285* 78 244*   Microbiology No results found for this or any previous visit (from the past 240 hour(s)).  Procedures and diagnostic studies:  No results found.  Medications:   . apixaban  5 mg Oral BID  . atorvastatin  80 mg Oral QHS  . buPROPion  150 mg Oral BID  . donepezil  5 mg Oral q morning - 10a  . feeding supplement (ENSURE ENLIVE)  237 mL Oral BID BM  . furosemide  40 mg Oral Daily  . insulin aspart  0-15 Units Subcutaneous TID WC  . insulin aspart  4 Units Subcutaneous TID WC  . insulin glargine  15 Units Subcutaneous QHS  . isosorbide mononitrate  30 mg Oral Daily  . losartan  50 mg Oral Daily  . mirtazapine  15 mg Oral QHS  . multivitamin with minerals  1 tablet Oral Daily  . nebivolol  20 mg Oral QHS  . pantoprazole  40 mg Oral BID AC  . QUEtiapine  200 mg Oral QPM  . senna-docusate  2 tablet Oral BID  . tamsulosin  0.8 mg Oral Daily  . thiamine  100 mg Oral Daily  . tuberculin  5 Units Intradermal Once  . vitamin B-12  2,000 mcg Oral QHS    LOS: 15 days    Clanford AvayaJohnson  [facility] Pager [phone].   Please refer to [email or web address], password [email or web address] to get updated schedule on who will round on this patient, as hospitalists switch teams weekly. If 7PM-7AM, please contact night-coverage at [email or web address], password [email or web address] for any overnight needs.  06/14/2017, 11:00 AM

## 2017-06-14 NOTE — Progress Notes (Signed)
Covering MD sent note of patients combative behavior and  refusal of cbg's and nightly scheduled medications. Sitter at bedside . Will continue to monitor patient during shift

## 2017-06-14 NOTE — Progress Notes (Signed)
Refused protonix.  Sitter at bedside. Patient sitting on bed.

## 2017-06-14 NOTE — Progress Notes (Signed)
Patient continues to be combative with care, swinging punches often attempting to strike staff. Sitter at bedside. Patient at this time is refusing scheduled blood sugars and med's at this time. Patient encouraged and instructed on the importance of med administration and blood sugars as part  of care. Will make covering MD team aware

## 2017-06-14 NOTE — Care Management Important Message (Signed)
Important Message  Patient Details  Name: Luis Yu MRN: [id number] Date of Birth: [birth year]   Medicare Important Message Given:  Yes    Renie OraHawkins, Dreden Rivere Smith 06/14/2017, 12:03 PM

## 2017-06-14 NOTE — Progress Notes (Signed)
Inpatient Diabetes Program Recommendations  AACE/ADA: New Consensus Statement on Inpatient Glycemic Control (2019)  Target Ranges:  Prepandial:   less than 140 mg/dL      Peak postprandial:   less than 180 mg/dL (1-2 hours)      Critically ill patients:  140 - 180 mg/dL  Results for Luis Yu, Luis Yu (MRN [id number]) as of 06/14/2017 10:19  Ref. Range 06/12/2017 08:00 06/12/2017 18:00 06/12/2017 20:34 06/13/2017 13:12 06/13/2017 21:09 06/14/2017 07:41  Glucose-Capillary Latest Ref Range: 65 - 99 mg/dL 409121 (H) 811186 (H) 914156 (H) 285 (H) 78 244 (H)    Review of Glycemic Control  Current orders for Inpatient glycemic control: Lantus 15 units QHS, Novolog 4 units TID with meals, Novolog 0-15 units TID with meals  Inpatient Diabetes Program Recommendations:  Insulin - Basal: Noted Lantus was NOT GIVEN last night (charted as hold per family request). As a result fasting glucose 244 mg/dl this morning.   Thanks, Orlando PennerMarie Kojo Liby, RN, MSN, CDE Diabetes Coordinator Inpatient Diabetes Program [phone] (Team Pager from 8am to 5pm)

## 2017-06-14 NOTE — Progress Notes (Signed)
Changed linen on pts bed

## 2017-06-14 NOTE — Clinical Social Work Note (Signed)
[facility] to follow up on referrals and left messages for both facilities requesting return contact.     LCSW discussed with spouse that patient had received no bed offers nor [facility]. LCSW discussed that patient is appropriate for discharge. Mrs. Upshaw stated that if patient could ambulate she would feel more comfortable taking him home. [facility].  Mrs. Caban stated that [facility] and that would discharge discharge home with her friend and call LCSW back.     Arminta Gamm, Juleen ChinaHeather D, LCSW

## 2017-06-14 NOTE — Clinical Social Work Note (Signed)
LCSW sent updated clinicals to [facility] at [facility] earlier in the day.    LCSW, CM and attending met with daughter and friend. LCSW discussed that no offers have been made and that patient's confusion is likely excerbated by being in an unfamiliar environment with unfamiliar people. Family friend indicated that hospital staff continues to "bother" patient. LCSW discussed that medical orders for checking vitals, and medication administration would have to be followed as patient was in the hospital. It was further discussed that patient was medically stable for discharge and that his situation had turned in to a social situation rather than a medical situation. LCSW discussed other alternative placement options. Mrs. Sonoda declined to have patient's clinicals referred to other placement options. LCSW did advise family that when patient is discharged that APS would likely be contacted if they do not pick him up nor allow for alternative placement attempts.  Family friend voiced her displeasure with patient being her being "bothered" by medical staff who are providing medically ordered services. Family continues to desire patient to go to [facility].      Luis Yu, Clydene Pugh, LCSW

## 2017-06-14 NOTE — Progress Notes (Signed)
Patient refused am medications. Will continue to monitor patient.

## 2017-06-14 NOTE — Progress Notes (Signed)
OT Cancellation Note  Patient Details Name: Doretha ImusDonald W Winker MRN: [id number] DOB: 05/30/1945   Cancelled Treatment:    Reason Eval/Treat Not Completed: Other (comment). Pt seen with PT for transfer to chair this am. Pt provided with set-up for face washing once seated in chair, unwilling to complete any ADLs beyond this.  Pt left with sitter in room. Will continue to follow pt.   Ezra SitesLeslie Holden Maniscalco, OTR/L  [phone] 06/14/2017, 10:39 AM

## 2017-06-14 NOTE — Progress Notes (Signed)
Physical Therapy Treatment Patient Details Name: Luis Yu Berrett MRN: [id number] DOB: 1945/03/22 Today's Date: 06/14/2017    History of Present Illness Luis Yu Lair is a 72 y.o. male s/p acute CVA after admission with medical history significant of paroxysmal atrial fibrillation on Eliquis, chronic diastolic congestive heart failure, recurrent dental infections, presented to the hospital with fever.  Patient has had a prior stroke and has some residual cognitive effects.  History is mostly obtained from his wife.  She reports that he was in his usual state of health the day before going to hospital. The morning he went to the hospital he woke up confused, having chills.  He was noted to be trembling and shaking.  He began coughing.  No vomiting or diarrhea.  He was noted to be lethargic/confused.  He was brought to the emergency room for evaluation where he was noted to be mildly hypotensive/tachycardic.  He received IV fluids with correction of hemodynamics.  He was noted to be febrile with a temperature of 102.  He received Tylenol with improvement of fever.  Imaging has shown evidence of right sided dental infection    PT Comments    Patient presents alert, cooperative and agreeable to transfer to chair to finish eating breakfast.  Patient demonstrates labored movement for sitting up at bedside with 1 episode of falling backwards due to fatigue, able to self correct and stood with RW, limited 5-6 side steps to transfer to chair and tolerated sitting up in chair to work with occupational therapy for eating breakfast.  Patient will benefit from continued physical therapy in hospital and recommended venue below to increase strength, balance, endurance for safe ADLs and gait.   Follow Up Recommendations  SNF     Equipment Recommendations  None recommended by PT    Recommendations for Other Services       Precautions / Restrictions Precautions Precautions: Fall Restrictions Weight Bearing  Restrictions: No    Mobility  Bed Mobility Overal bed mobility: Needs Assistance Bed Mobility: Supine to Sit     Supine to sit: Min assist;Mod assist     General bed mobility comments: slightly labored movement with occasional falling backwards  Transfers Overall transfer level: Needs assistance Equipment used: Rolling walker (2 wheeled) Transfers: Sit to/from [employer]Stand;Stand Pivot Transfers Sit to Stand: Min assist Stand pivot transfers: Min assist;Mod assist       General transfer comment: unsteady on feet  Ambulation/Gait Ambulation/Gait assistance: Mod assist Ambulation Distance (Feet): 4 Feet Assistive device: Rolling walker (2 wheeled) Gait Pattern/deviations: Decreased step length - right;Decreased step length - left;Decreased stride length   Gait velocity interpretation: Below normal speed for age/gender General Gait Details: limited to 5-6 unsteady slow labored side steps due to poor standing balance, fatigue   Stairs            Wheelchair Mobility    Modified Rankin (Stroke Patients Only)       Balance Overall balance assessment: Needs assistance Sitting-balance support: Feet supported;No upper extremity supported Sitting balance-Leahy Scale: Fair   Postural control: Posterior lean Standing balance support: Bilateral upper extremity supported;During functional activity Standing balance-Leahy Scale: Poor Standing balance comment: fair/poor with RW                            Cognition Arousal/Alertness: Awake/alert Behavior During Therapy: WFL for tasks assessed/performed Overall Cognitive Status: Within Functional Limits for tasks assessed  Exercises      General Comments        Pertinent Vitals/Pain Pain Assessment: No/denies pain    Home Living                      Prior Function            PT Goals (current goals can now be found in the care plan  section) Acute Rehab PT Goals Patient Stated Goal: return home after rehab PT Goal Formulation: With patient Time For Goal Achievement: 06/26/17 Potential to Achieve Goals: Good Progress towards PT goals: Progressing toward goals    Frequency    Min 6X/week      PT Plan Current plan remains appropriate    Co-evaluation              AM-PAC PT "6 Clicks" Daily Activity  Outcome Measure  Difficulty turning over in bed (including adjusting bedclothes, sheets and blankets)?: A Little Difficulty moving from lying on back to sitting on the side of the bed? : A Lot Difficulty sitting down on and standing up from a chair with arms (e.g., wheelchair, bedside commode, etc,.)?: A Lot Help needed moving to and from a bed to chair (including a wheelchair)?: A Lot Help needed walking in hospital room?: A Lot Help needed climbing 3-5 steps with a railing? : Total 6 Click Score: 12    End of Session   Activity Tolerance: Patient tolerated treatment well;Patient limited by fatigue Patient left: in chair;with call bell/phone within reach;with nursing/sitter in room Nurse Communication: Mobility status PT Visit Diagnosis: Unsteadiness on feet (R26.81);Other abnormalities of gait and mobility (R26.89);Muscle weakness (generalized) (M62.81)     Time: 0981-1914 PT Time Calculation (min) (ACUTE ONLY): 16 min  Charges:  $Therapeutic Activity: 8-22 mins                    G Codes:       2:03 PM, June 17, 2017 Ocie Bob, MPT Physical Therapist with [facility] [phone] office [phone] mobile phone

## 2017-06-15 LAB — GLUCOSE, CAPILLARY: Glucose-Capillary: 229 mg/dL — ABNORMAL HIGH (ref 65–99)

## 2017-06-15 MED ORDER — DONEPEZIL HCL 5 MG PO TABS: 5.0000 mg | ORAL_TABLET | Freq: Every morning | ORAL | 0 refills | Status: AC

## 2017-06-15 MED ORDER — ADULT MULTIVITAMIN W/MINERALS CH: 1.0000 | ORAL_TABLET | Freq: Every day | ORAL | Status: AC

## 2017-06-15 MED ORDER — ENSURE ENLIVE PO LIQD: 237.0000 mL | Freq: Two times a day (BID) | ORAL | 0 refills | Status: AC

## 2017-06-15 MED ORDER — QUETIAPINE FUMARATE 200 MG PO TABS: 200.0000 mg | ORAL_TABLET | Freq: Every evening | ORAL | 0 refills | Status: AC

## 2017-06-15 MED ORDER — STARCH (THICKENING) PO POWD: ORAL | 0 refills | Status: AC

## 2017-06-15 MED ORDER — TAMSULOSIN HCL 0.4 MG PO CAPS: 0.8000 mg | ORAL_CAPSULE | Freq: Every day | ORAL | 0 refills | Status: AC

## 2017-06-15 NOTE — Discharge Instructions (Signed)
Follow with Primary MD  Shirline Frees, NP  and other consultants as instructed your Hospitalist MD  Please get a complete blood count and chemistry panel checked by your Primary MD at your next visit, and again as instructed by your Primary MD.  Get Medicines reviewed and adjusted: Please take all your medications with you for your next visit with your Primary MD  Laboratory/radiological data: Please request your Primary MD to go over all hospital tests and procedure/radiological results at the follow up, please ask your Primary MD to get all Hospital records sent to his/her office.  In some cases, they will be blood work, cultures and biopsy results pending at the time of your discharge. Please request that your primary care M.D. follows up on these results.  Also Note the following: If you experience worsening of your admission symptoms, develop shortness of breath, life threatening emergency, suicidal or homicidal thoughts you must seek medical attention immediately by calling 911 or calling your MD immediately  if symptoms less severe.  You must read complete instructions/literature along with all the possible adverse reactions/side effects for all the Medicines you take and that have been prescribed to you. Take any new Medicines after you have completely understood and accpet all the possible adverse reactions/side effects.   Do not drive when taking Pain medications or sleeping medications (Benzodaizepines)  Do not take more than prescribed Pain, Sleep and Anxiety Medications. It is not advisable to combine anxiety,sleep and pain medications without talking with your primary care practitioner  Special Instructions: If you have smoked or chewed Tobacco  in the last 2 yrs please stop smoking, stop any regular Alcohol  and or any Recreational drug use.  Wear Seat belts while driving.  Please note: You were cared for by a hospitalist during your hospital stay. Once you are discharged,  your primary care physician will handle any further medical issues. Please note that NO REFILLS for any discharge medications will be authorized once you are discharged, as it is imperative that you return to your primary care physician (or establish a relationship with a primary care physician if you do not have one) for your post hospital discharge needs so that they can reassess your need for medications and monitor your lab values.    Rehabilitation After a Stroke, Adult A stroke causes damage to the brain cells, which can affect your ability to walk, talk, or remember things. The impact of a stroke is different for everyone, and so is recovery. Some people have progress during the first few days after treatment. Others may take weeks or longer to make progress. Stroke rehabilitation includes a variety of treatments to help you recover and promote your independence after a stroke. You may not be able do everything that you did before the stroke, but you can learn ways to manage your lifestyle and be as independent as possible. Rehabilitation will start as soon as you are able to participate after your stroke, and it involves care from a team that may include:  Family and friends. Your loved ones know you best and can be very helpful in your recovery.  Physicians.  Nurses.  Physical and occupational therapists.  Speech-language therapists.  A nutritionist.  A psychologist.  A Child psychotherapist.  Keep open communication with all members of your care team. Share your medical records if needed, and take notes about each provider's recommendations. What is physical therapy? Physical therapists (PTs) help you to improve your coordination, balance, and muscle strength.  Physical therapy may involve:  Range of motion exercises.  Help to move between lying, sitting, and standing positions.  Walking with a cane or walker, if needed.  Help using stairs.  What is occupational  therapy? Occupational therapists (OTs) help you rebuild your ability to do everyday tasks, such as brushing your teeth, going to the bathroom, eating, and getting dressed. Occupational therapy may also help with:  Vision. Visual scanning is a technique that is used to prevent falls.  Memory and cognitive training. This therapy includes problem-solving techniques and relearning tasks like making a phone call.  Fine muscle movements such as buttoning a shirt or picking up small objects.  What is speech therapy? Speech-language therapists help you communicate. After a stroke, you may have problems understanding what people are saying, or you may have trouble writing, speaking, or finding the right word for what you want to say. You may also need speech therapy if you have difficulty swallowing while eating and drinking. Examples of speech-language therapies include:  Techniques to strengthen muscles used in swallowing.  Naming objects or describing pictures. This helps retrain the brain to recognize and remember words.  Exercises to strengthen the muscles involved in talking, including your tongue and lips.  Exercises to retrain your brain in understanding what you read and hear.  How often will I need therapy? Therapy will begin as soon as you are able to participate, which is often within the first few days after a stroke. Sessions will be frequent at first. For example, you may have therapy 2-3 hours a day on most days of the week during the first few months. The intensity depends on the type and severity of your stroke. You may need therapy for several months. Therapy may take place in the hospital, at a rehabilitation center, or in your home. Are there any side effects of therapy? Therapy is safe and is usually well-tolerated. You may feel physically and mentally tired after therapy, especially during the first few weeks. Rest before therapy sessions if you need to so you can get the most  out of your rehabilitation. Follow these instructions at home:  Involve your family and friends in your recovery, if possible. Having another person to encourage you is beneficial.  Follow instructions from your speech-language therapist, nutritionist, or health care provider about what you can safely eat and drink. Eat healthy foods. If your ability to swallow was affected by the stroke, you may need to take steps to avoid choking, such as: ? Taking small bites when eating. ? Eating foods that are soft or pureed. ? Drinking liquids that have been thickened.  Maintain social connections and interactions with friends, family, and community groups. This is an important part of your recovery. Communication challenges and physical challenges may cause you to feel isolated after a stroke.  Consider joining a support group that allows you to talk about the impact of stroke on your life. A psychologist or counselor may be recommended. Your emotional recovery from stroke is just as important as your physical recovery.  Keep all follow-up visits as told by your health care providers. This is important. Summary  Stroke rehabilitation includes a variety of treatments to help you recover and promote your independence after a stroke.  Rehabilitation will start as soon as you are able to participate after your stroke, and it includes care from a team of experts.  The intensity of therapy depends on the type and severity of your stroke. You may need  therapy for several months. This information is not intended to replace advice given to you by your health care provider. Make sure you discuss any questions you have with your health care provider. Document Released: 03/25/2007 Document Revised: 03/06/2016 Document Reviewed: 03/06/2016 Elsevier Interactive Patient Education  2018 [employer].    Hospital Discharge After a Stroke  Being discharged from the hospital after a stroke can feel overwhelming.  Many things may be different, and it is normal to feel scared or anxious. Some stroke survivors may be able to return to their homes, and others may need more specialized care on a temporary or permanent basis. Your stroke care team will work with you to develop a discharge plan that is best for you. Ask questions if you do not understand something. Invite a friend or family member to participate in discharge planning. Understanding and following your discharge plan can help to prevent another stroke or other problems. Understanding your medicines After a stroke, your health care provider may prescribe one or more types of medicine. It is important to take medicines exactly as told by your health care provider. Serious harm, such as another stroke, can happen if you are unable to take your medicine exactly as prescribed. Make sure you understand:  What medicine to take.  Why you are taking the medicine.  How and when to take it.  If it can be taken with your other medicines and herbal supplements.  Possible side effects.  When to call your health care provider if you have any side effects.  How you will get and pay for your medicines. Medical assistance programs may be able to help you pay for prescription medicines if you cannot afford them.  If you are taking an anticoagulant, be sure to take it exactly as told by your health care provider. This type of medicine can increase the risk of bleeding because it works to prevent blood from clotting. You may need to take certain precautions to prevent bleeding. You should contact your health care provider if you have:  Bleeding or bruising.  A fall or other injury to your head.  Blood in your urine or stool (feces).  Planning for home safety Take steps to prevent falls, such as installing grab bars or using a shower chair. Ask a friend or family member to get needed things in place before you go home if possible. A therapist can come to your  home to make recommendations for safety equipment. Ask your health care provider if you would benefit from this service or from home care. Getting needed equipment Ask your health care provider for a list of any medical equipment and supplies you will need at home. These may include items such as:  Walkers.  Canes.  Wheelchairs.  Hand-strengthening devices.  Special eating utensils.  Medical equipment can be rented or purchased, depending on your insurance coverage. Check with your insurance company about what is covered. Keeping follow-up visits After a stroke, you will need to follow up regularly with a health care provider. You may also need rehabilitation, which can include physical therapy, occupational therapy, or speech-language therapy. Keeping these appointments is very important to your recovery after a stroke. Be sure to bring your medicine list and discharge papers with you to your appointments. If you need help to keep track of your schedule, use a calendar or appointment reminder. Preventing another stroke Having a stroke puts you at risk for another stroke in the future. Ask your health care  provider what actions you can take to lower the risk. These may include:  Increasing how much you exercise.  Making a healthy eating plan.  Quitting smoking.  Managing other health conditions, such as high blood pressure, high cholesterol, or diabetes.  Limiting alcohol use.  Knowing the warning signs of a stroke Make sure you understand the signs of a stroke. Before you leave the hospital, you will receive information outlining the stroke warning signs. Share these with your friends and family members. "BE FAST" is an easy way to remember the main warning signs of a stroke:  B - Balance. Signs are dizziness, sudden trouble walking, or loss of balance.  E - Eyes. Signs are trouble seeing or a sudden change in vision.  F - Face. Signs are sudden weakness or numbness of the  face, or the face or eyelid drooping on one side.  A - Arms. Signs are weakness or numbness in an arm. This happens suddenly and usually on one side of the body.  S - Speech. Signs are sudden trouble speaking, slurred speech, or trouble understanding what people say.  T - Time. Time to call emergency services. Write down what time symptoms started.  Other signs of stroke may include:  A sudden, severe headache with no known cause.  Nausea or vomiting.  Seizure.  These symptoms may represent a serious problem that is an emergency. Do not wait to see if the symptoms will go away. Get medical help right away. Call your local emergency services (911 in the [place] Do not drive yourself to the hospital. Make note of the time that you had your first symptoms. Your emergency responders or emergency room staff will need to know this information. Summary  Being discharged from the hospital after a stroke can feel overwhelming. It is normal to feel scared or anxious.  Make sure you take medicines exactly as told by your health care provider.  Know the warning signs of a stroke, and get help right way if you have any of these symptoms. "BE FAST" is an easy way to remember the main warning signs of a stroke. This information is not intended to replace advice given to you by your health care provider. Make sure you discuss any questions you have with your health care provider. Document Released: 06/08/2016 Document Revised: 06/08/2016 Document Reviewed: 06/08/2016 Elsevier Interactive Patient Education  [employer].

## 2017-06-15 NOTE — Progress Notes (Signed)
PPD results re-read by RN this AM to right arm,  No raised or suspicious area noted to any area on right arm. Negative appearing PPD test.

## 2017-06-15 NOTE — Discharge Summary (Addendum)
Physician Discharge Summary  BURGESS SHERIFF ZOX:[id number] DOB: 19-Feb-1946 DOA: 05/29/2017  PCP: Shirline Frees, NP  Admit date: 05/29/2017 Discharge date: 06/15/2017  Admitted From: Home   Disposition: Home with Home Health  Pt was recommended for SNF placement but the primary family choices have declined to accept patient and family would not consider the other facilities in the area and decided to take him home with home health.  We were told that he is being taken to his home in [place], [place].  An appointment was made for patient with Dr. Jeanella Cara.  Home healthcare with [facility] was arranged for patient in [facility] and they plan to see patient tomorrow 06/16/17.  Wife and friend agree to provide 24/7 care.   Recommendations for Outpatient Follow-up:  1. Follow up with PCP next week for hospital Follow up  2. Establish care and follow up with a neurologist in 4-6 weeks 3. Please obtain BMP/CBC when follow up with PCP  Home Health: PT, RN  Discharge Condition: STABLE   CODE STATUS: DNR    Brief Hospitalization Summary: Please see all hospital notes, images, labs for full details of the hospitalization.  Luis Yu is an 72 y.o. male with a PMH of PAF on Eliquis, chronic diastolic CHF, vascular dementia, recurrent dental infections who was admitted 05/29/17 with fever secondary to a recurrent dental infection. Despite appropriate therapy, his fevers persisted and further workup indicated evidence of aspiration pneumonia. He was evaluated by speech therapy and found to have dysphagia. He was placed on nectar thickened liquids.  He was slowly improving with IV antibiotics but then developed changes in his mental status. Workup revealed new right sided lacunar infarcts. Neurology was subsequently consulted. Hospital course further complicated by increasing agitation/sundowning, acute respiratory failure requiring BiPAP. Psychiatry also followed him for treatment of his agitation/sundowning and  he has been placed on Seroquel for symptom management.  Pertinent events:  05/29/17: Admitted for treatment of dental abscess.  05/30/17: Noted to be agitated overnight.  Given Ativan and Haldol.  Ativan subsequently held due to somnolence.  Foley placed for urinary retention. PT evaluated patient and recommended SNF.  05/31/17: Placed on vancomycin and Zosyn for aspiration pneumonia after CT chest performed.  06/01/17: Remains encephalopathic.  Haldol dose decreased.  Required restraints.  06/02/17: Became combative.  CT of the brain ordered.  Placed on BiPAP due to respiratory failure after being  given Ativan/Haldol.  06/03/17: Remains combative/aggressive.  Continues to require restraint at times.  Transferred to the ICU.    Stroke workup initiated/neurology consulted.  Placed on Lovenox due to inability to swallow pills.  2D echo showed EF 40% with basal inferior akinesis and hypokinesis elsewhere.  Carotid Dopplers showed mild stenosis on the right and moderate stenosis on the left.  06/04/17: Neurologist evaluated the patient.ST reports oral phase dysphagia, mild aspiration risk. Regular diet/nectar thick liquids. Agitated in the pm, requiring Haldol/Ativan/restraint.  06/05/17: Foley D/C'd. Neurologist increased Seroquel & started Aricept. Remains confused/agitated at times.  06/06/17: Not a CIR candidate. Neurologist feels sundowning symptoms improving.  06/08/17: Verbally aggressive. Refusing meds.  06/09/17: Blood drawn due to scratching employee and screened for HIV. Back in restraints for aggressive/combative behaviors.   06/10/17: TTS reviewed meds and does not recommend changes. Wife requests transfer to hospital in [facility].  [facility] hospital and spoke with Dr. Reyes Ivan who declined transfer, felt patient more appropriate for Rehab.  06/11/17: Agitated again last night. Given Risperdal. Noted to be dyspneic. Normally on  Lasix at home, but not taking here. CXR ordered and Lasix 40 mg  IV given.  Suspect delirium is being triggered by CHF/dyspnea.  06/13/17: PPD placed.  06/14/17: off restraints x 48 hours, sitting up in chair, ambulating more with PT.  Mtg with wife and friend, she again declines to send his clinicals to other SNF facilities in the area.   She has elected to take patient home 3/31 to their home in [facility] and asked for [facility] to be arranged for him in [place].  The care management team made the arrangements.   Assessment/Plan:   Acute CVA (cerebrovascular accident)  2 small acute lacunar infarcts in the right hemisphere discovered on MRI 06/03/17 after an evaluation was undertaken due to patient's change in behavior with agitation which was documented on 05/30/17.  He was evaluated by neurology.  Felt to have an embolic stroke in the setting of atrial fibrillation and acute illness with sepsis.  Anticoagulation recommended.  The patient has been refusing to take his meds at times and he was counseled in addition to his wife that missing doses of eliquis increase his risk of having another stroke.  They verbalized understanding.    Carotid Doppler IMPRESSION: 1. Mild (1-49%) stenosis proximal right internal carotid artery secondary to heterogenous atherosclerotic plaque. 2. Moderate (50-69%) stenosis proximal left internal carotid artery secondary to heterogenous atherosclerotic plaque. 3. Vertebral arteries are patent with antegrade flow.  Agitation/encephalopathy/delirium. The patient reportedly does not have any history of sundowning behavior per wife and I suspect his encephalopathy is from delirium, but wife says that he wanders the house at night regularly at home and sleeps mostly during the day.  This is likely from his dementia. He takes Lasix intermittently at home per wife and has been refusing multiple doses in the hospital.  RPR non-reactive 12/05/11. TSH WNL. B12.  Vitamin B12 levels 4667.  Continue Aricept and Seroquel.  Added daytime  seroquel as needed per behavioral health team.  Overall he seems to be improving.   He refuses to take meds at times. He has been abusive to staff.  He called a staff member a "monkey."  He has refused to cooperate at times and accept care from others.   DM (diabetes mellitus) type 2 uncontrolled with retinopathy Treated with glipizide and Lantus at home. Currently on moderate scale SSI with 4 units of meal coverage and 15 units of Lantus.  Unfortunately patient and family had been refusing to allow staff to give him his scheduled or correction insulin, he did not receive any lantus yesterday and blood sugars are up this morning.   CBG (last 3)  Recent Labs    06/14/17 1117 06/14/17 1629 06/15/17 0745  GLUCAP 75 83 229*   Hyperlipidemia Continue statin.  Severe Sleep Apnea -central and obstructive Continue BiPAP at bedtime.  He has been refusing it in the hospital most nights. At home, wife says he uses it intermittently and only for a few hours if he does use it.    Essential hypertension His blood pressures have been well controlled.   GERD Treated with PPI for GI protection.  Paroxysmal Atrial fibrillation CHADS2Vasc is 7. On Eliquis.  Rate controlled on Bystolic.  Coronary artery disease involving coronary bypass graft of native heart without angina pectoris Stable, no chest pain symptoms.   Dental infection - Treated Had right mandibular swelling on presentation and a CT done at that time showed infection without any drainable abscess.  Initially treated with  Unasyn which has completed treatment course.   Chronic systolic CHF (congestive heart failure)  Pt has been taking lasix intermittently at home and refusing multiple doses in the hospital.  He is clinically compensated and does not appear fluid overloaded.  He should follow up with his PCP and cardiologist for further outpatient management.  His [place] [facility] [place] [facility] [place] and will have him  establish care there.    Echocardiogram Study Conclusions - Left ventricle: Difficult to assess LVEF even with Definity LVEF  is depressed at approximately 40% with basal inferior akinesis;  hypokinesis elsewhere. The cavity size was normal. Wall thickness  was increased in a pattern of moderate LVH. - Left atrium: The atrium was moderately to severely dilated. - Right ventricle: RV is mildly dilated and RVEF appears mildly reduced. - Right atrium: The atrium was mildly dilated.    Aspiration pneumonia  Likely from nausea/vomiting in the setting of dental  infection.  Blood cultures done on admission negative.  Treated with IV unasyn.   Protein-calorie malnutrition, severe Encourage PO intake and provided feeding assistance in hospital.  He is eating a little better.  He does not like hospital food and refuses at times.  Family says that they will care for him 24/7 and will make sure he receives adequate food and drink.    Vascular dementia Continue Aricept and seroquel.   Acute urinary retention Foley placed 05/29/17.  Flomax subsequently added. Foley subsequently discontinued.  He is urinating.  We are monitoring this closely and he has been urinating.    Family Communication/Anticipated D/C date and plan/Code Status   DVT prophylaxis: Eliquis  Code Status: DNR. Family Communication: Wife  Disposition Plan: Family decided to [place] [facility] [place] [facility]  Medical Consultants:    Neurology  Telepsychiatry  Anti-Infectives:    completed 7 days of Unasyn.  Discharge Diagnoses:  Principal Problem:   Acute CVA (cerebrovascular accident) (HCC) Active Problems:   DM (diabetes mellitus) type II uncontrolled with retinopathy   Hyperlipidemia   OSA (obstructive sleep apnea)   Essential hypertension   GERD   Atrial fibrillation (HCC)   Stroke (cerebrum) (HCC)   Coronary artery disease involving coronary bypass graft of native heart without  angina pectoris   Hypertension   GERD (gastroesophageal reflux disease)   Diabetic peripheral neuropathy (HCC)   Diabetes mellitus type II, uncontrolled (HCC)   Dental infection   Chronic diastolic CHF (congestive heart failure) (HCC)   Aspiration pneumonia (HCC)   Protein-calorie malnutrition, severe   Vascular dementia   Agitation  Discharge Instructions: Discharge Instructions    Call MD for:  difficulty breathing, headache or visual disturbances   Complete by:  As directed    Call MD for:  extreme fatigue   Complete by:  As directed    Call MD for:  persistant dizziness or light-headedness   Complete by:  As directed    Call MD for:  persistant nausea and vomiting   Complete by:  As directed    Call MD for:  severe uncontrolled pain   Complete by:  As directed    Increase activity slowly   Complete by:  As directed      Allergies as of 06/15/2017      Reactions   Lorazepam Other (See Comments)   Makes more agitated and delirious      Medication List    STOP taking these medications   glipiZIDE 10 MG  tablet Commonly known as:  GLUCOTROL   losartan 50 MG tablet Commonly known as:  COZAAR   metFORMIN 1000 MG tablet Commonly known as:  GLUCOPHAGE   penicillin v potassium 500 MG tablet Commonly known as:  VEETID     TAKE these medications   acetaminophen 500 MG tablet Commonly known as:  TYLENOL Take 2 tablets (1,000 mg total) by mouth every 6 (six) hours as needed. What changed:  reasons to take this   apixaban 5 MG Tabs tablet Commonly known as:  ELIQUIS Take 1 tablet (5 mg total) by mouth 2 (two) times daily.   ARTIFICIAL TEARS OP Apply 1 drop to eye every 2 (two) hours as needed (Dryness).   atorvastatin 80 MG tablet Commonly known as:  LIPITOR Take 1 tablet (80 mg total) by mouth daily.   BASAGLAR KWIKPEN 100 UNIT/ML Sopn Inject 0.25 mLs (25 Units total) into the skin at bedtime. What changed:  how much to take   buPROPion 150 MG 12 hr  tablet Commonly known as:  WELLBUTRIN SR Take 1 tablet (150 mg total) by mouth 2 (two) times daily.   BYSTOLIC 20 MG Tabs Generic drug:  Nebivolol HCl Take 1 tablet by mouth daily.   donepezil 5 MG tablet Commonly known as:  ARICEPT Take 1 tablet (5 mg total) by mouth every morning. Start taking on:  06/16/2017   feeding supplement (ENSURE ENLIVE) Liqd Take 237 mLs by mouth 2 (two) times daily between meals.   ferrous sulfate 325 (65 FE) MG tablet Take 325 mg by mouth every other day.   food thickener Powd Commonly known as:  THICK IT Use as directed to thicken all liquids to nectar consistency prior to drinking   furosemide 20 MG tablet Commonly known as:  LASIX Take 2 tablets (40 mg total) by mouth daily.   insulin aspart 100 UNIT/ML injection Commonly known as:  novoLOG Inject 5-15 Units into the skin 3 (three) times daily before meals. Per sliding scale   isosorbide mononitrate 30 MG 24 hr tablet Commonly known as:  IMDUR Take 1 tablet (30 mg total) by mouth daily.   mirtazapine 15 MG tablet Commonly known as:  REMERON Take 15 mg by mouth at bedtime.   multivitamin with minerals Tabs tablet Take 1 tablet by mouth daily. Start taking on:  06/16/2017   omeprazole 20 MG capsule Commonly known as:  PRILOSEC TAKE ONE CAPSULE TWICE A DAY BEFORE A MEAL   QUEtiapine 200 MG tablet Commonly known as:  SEROQUEL Take 1 tablet (200 mg total) by mouth every evening.   tamsulosin 0.4 MG Caps capsule Commonly known as:  FLOMAX Take 2 capsules (0.8 mg total) by mouth daily. Start taking on:  06/16/2017   thiamine 100 MG tablet Take 1 tablet (100 mg total) by mouth daily.   vitamin B-12 1000 MCG tablet Commonly known as:  CYANOCOBALAMIN Take 2 tablets (2,000 mcg total) by mouth daily.       Contact information for follow-up providers    Dr. Jeanella Cara. Schedule an appointment as soon as possible for a visit in 5 day(s).   Why:  Hospital Follow Up             Contact information for after-discharge care    Destination    [facility] .   Service:  Skilled Paramedic information: [address] [place] [address] [phone]  Allergies  Allergen Reactions  . Lorazepam Other (See Comments)    Makes more agitated and delirious   Allergies as of 06/15/2017      Reactions   Lorazepam Other (See Comments)   Makes more agitated and delirious      Medication List    STOP taking these medications   glipiZIDE 10 MG tablet Commonly known as:  GLUCOTROL   losartan 50 MG tablet Commonly known as:  COZAAR   metFORMIN 1000 MG tablet Commonly known as:  GLUCOPHAGE   penicillin v potassium 500 MG tablet Commonly known as:  VEETID     TAKE these medications   acetaminophen 500 MG tablet Commonly known as:  TYLENOL Take 2 tablets (1,000 mg total) by mouth every 6 (six) hours as needed. What changed:  reasons to take this   apixaban 5 MG Tabs tablet Commonly known as:  ELIQUIS Take 1 tablet (5 mg total) by mouth 2 (two) times daily.   ARTIFICIAL TEARS OP Apply 1 drop to eye every 2 (two) hours as needed (Dryness).   atorvastatin 80 MG tablet Commonly known as:  LIPITOR Take 1 tablet (80 mg total) by mouth daily.   BASAGLAR KWIKPEN 100 UNIT/ML Sopn Inject 0.25 mLs (25 Units total) into the skin at bedtime. What changed:  how much to take   buPROPion 150 MG 12 hr tablet Commonly known as:  WELLBUTRIN SR Take 1 tablet (150 mg total) by mouth 2 (two) times daily.   BYSTOLIC 20 MG Tabs Generic drug:  Nebivolol HCl Take 1 tablet by mouth daily.   donepezil 5 MG tablet Commonly known as:  ARICEPT Take 1 tablet (5 mg total) by mouth every morning. Start taking on:  06/16/2017   feeding supplement (ENSURE ENLIVE) Liqd Take 237 mLs by mouth 2 (two) times daily between meals.   ferrous sulfate 325 (65 FE) MG tablet Take 325 mg by mouth every other day.   food thickener  Powd Commonly known as:  THICK IT Use as directed to thicken all liquids to nectar consistency prior to drinking   furosemide 20 MG tablet Commonly known as:  LASIX Take 2 tablets (40 mg total) by mouth daily.   insulin aspart 100 UNIT/ML injection Commonly known as:  novoLOG Inject 5-15 Units into the skin 3 (three) times daily before meals. Per sliding scale   isosorbide mononitrate 30 MG 24 hr tablet Commonly known as:  IMDUR Take 1 tablet (30 mg total) by mouth daily.   mirtazapine 15 MG tablet Commonly known as:  REMERON Take 15 mg by mouth at bedtime.   multivitamin with minerals Tabs tablet Take 1 tablet by mouth daily. Start taking on:  06/16/2017   omeprazole 20 MG capsule Commonly known as:  PRILOSEC TAKE ONE CAPSULE TWICE A DAY BEFORE A MEAL   QUEtiapine 200 MG tablet Commonly known as:  SEROQUEL Take 1 tablet (200 mg total) by mouth every evening.   tamsulosin 0.4 MG Caps capsule Commonly known as:  FLOMAX Take 2 capsules (0.8 mg total) by mouth daily. Start taking on:  06/16/2017   thiamine 100 MG tablet Take 1 tablet (100 mg total) by mouth daily.   vitamin B-12 1000 MCG tablet Commonly known as:  CYANOCOBALAMIN Take 2 tablets (2,000 mcg total) by mouth daily.       Procedures/Studies: Ct Head Wo Contrast  Result Date: 06/02/2017 CLINICAL DATA:  72 year old male with unexplained altered mental status. EXAM: CT HEAD WITHOUT CONTRAST TECHNIQUE: Contiguous axial images  were obtained from the base of the skull through the vertex without intravenous contrast. COMPARISON:  Brain MRI and head CT without contrast 06/28/2016 FINDINGS: Brain: Stable cerebral volume. No midline shift, ventriculomegaly, mass effect, evidence of mass lesion, intracranial hemorrhage or evidence of cortically based acute infarction. Patchy and confluent left greater than right cerebral white matter hypodensity appears stable. Chronic hypodensity in the left thalamus is stable.  Hypodensity in the posterior limb of the right external capsule is new since 2018 on series 2, image 19 and sagittal image 22. No associated mass effect. Stable gray-white matter differentiation elsewhere. Vascular: Calcified atherosclerosis at the skull base. Skull: Stable.  No acute osseous abnormality identified. Sinuses/Orbits: Visualized paranasal sinuses and mastoids are stable and well pneumatized. Other: No acute orbit or scalp soft tissue findings. IMPRESSION: 1. Age indeterminate small vessel ischemia in the posterior right external capsule is new since April 2018. No associated hemorrhage or mass effect. 2. Elsewhere the noncontrast CT appearance of the brain is stable since 2018. Electronically Signed   By: Odessa Fleming M.D.   On: 06/02/2017 16:32   Ct Chest W Contrast  Result Date: 05/31/2017 CLINICAL DATA:  Inpatient.  Altered mental status.  Dyspnea. EXAM: CT CHEST WITH CONTRAST TECHNIQUE: Multidetector CT imaging of the chest was performed during intravenous contrast administration. CONTRAST:  75mL ISOVUE-300 IOPAMIDOL (ISOVUE-300) INJECTION 61% COMPARISON:  05/29/2017 chest radiograph.  05/24/2016 chest CT. FINDINGS: Cardiovascular: Borderline mildly enlarged heart. No significant pericardial fluid/thickening. Left main and 3 vessel coronary atherosclerosis status post CABG. Atherosclerotic nonaneurysmal thoracic aorta. Stable dilated main pulmonary artery (3.6 cm diameter). No central pulmonary emboli. Mediastinum/Nodes: No discrete thyroid nodules. Unremarkable esophagus. No pathologically enlarged axillary, mediastinal or hilar lymph nodes. Lungs/Pleura: No pneumothorax. Small dependent bilateral pleural effusions, left greater than right. Patchy consolidation and ground-glass attenuation throughout the dependent left lower lobe. Interlobular septal thickening throughout both lungs. Scattered tiny granulomas in the right lung are stable. No new significant pulmonary nodules. Upper abdomen:  Exophytic simple 1.4 cm anterior upper right renal cyst. Musculoskeletal: No aggressive appearing focal osseous lesions. Stable configuration of median sternotomy wires, several of which demonstrate discontinuities, unchanged. Moderate thoracic spondylosis. IMPRESSION: 1. Patchy consolidation and ground-glass attenuation in dependent left lower lobe, suggesting aspiration or pneumonia. 2. Spectrum of findings suggestive of mild congestive heart failure, with borderline mild cardiomegaly, small dependent bilateral pleural effusions and interlobular septal thickening compatible with pulmonary edema. Aortic Atherosclerosis (ICD10-I70.0). Electronically Signed   By: Delbert Phenix M.D.   On: 05/31/2017 19:31   Mr Shirlee Latch ZO Contrast  Result Date: 06/03/2017 CLINICAL DATA:  72 year old male with altered mental status and confusion for 2 days. Age indeterminate small vessel ischemia in the right external capsule on head CT yesterday. EXAM: MRI HEAD WITHOUT CONTRAST MRA HEAD WITHOUT CONTRAST TECHNIQUE: Multiplanar, multiecho pulse sequences of the brain and surrounding structures were obtained without intravenous contrast. Angiographic images of the head were obtained using MRA technique without contrast. COMPARISON:  Head CT without contrast 06/02/2017. Brain MRI and intracranial MRA 06/28/2016. FINDINGS: MRI HEAD FINDINGS The examination had to be discontinued prior to completion due to patient confusion. Axial and coronal diffusion weighted imaging, sagittal T1 and axial T2 weighted imaging only was obtained. Brain: The posterior limb right internal capsule area of hypodensity seen by CT does correspond to an oval 11 millimeter area of restricted diffusion (series 3, image 87). There is associated T2 hyperintensity, with no mass effect or evidence of associated hemorrhage. There is also a  tiny additional subcortical white matter focus of restricted diffusion in the superolateral right occipital lobe bordering the  posterior temporal lobe on series 3, image 85. No contralateral or posterior fossa restricted diffusion. Stable cerebral volume. Chronic lacunar infarcts in the left superior cerebellum, bilateral thalami, left corona radiata. No no midline shift, mass effect, evidence of mass lesion, ventriculomegaly, extra-axial collection or acute intracranial hemorrhage. Cervicomedullary junction and pituitary are within normal limits. Vascular: Major intracranial vascular flow voids are stable since 2018. Skull and upper cervical spine: Stable. Sinuses/Orbits: Stable. Other: Stable trace left mastoid fluid. Small volume retained secretions in the nasopharynx. MRA HEAD FINDINGS Stable antegrade flow in the posterior circulation. Chronic irregularity and mild stenosis in the right vertebral artery V4 segment. Both PICA origins remain patent. Negative distal left vertebral artery. Patent vertebrobasilar junction and basilar artery without stenosis. SCA and PCA origins remain normal. PCA branches are stable and within normal limits. Both posterior communicating arteries are present. Stable antegrade flow in both ICA siphons. Mild siphon irregularity with no stenosis. Ophthalmic and posterior communicating artery origins remain normal. Patent carotid termini. MCA and ACA origins remain normal. The left A1 segment is mildly dominant as before. The posterior communicating scratched at the anterior communicating artery is fenestrated, normal variant. Visible ACA branches are stable and within normal limits. The left MCA M1 segment and bifurcation remain patent. There is mild irregularity and stenosis at the distal M1 and bifurcation, more affecting the anterior left M2 origin. Stable visible left MCA branches. The right MCA M1 segment and trifurcation remain patent. There is mild irregularity and stenosis at the distal right M1 which is stable (series 103, image 11). The visible right MCA branches are stable. IMPRESSION: 1. Two small  acute lacunar infarcts in the right hemisphere, the larger at the right external capsule corresponding to the indeterminate CT finding yesterday. No associated hemorrhage or mass effect. 2. No intracranial artery occlusion identified. Mild intracranial atherosclerosis and stenosis in both the anterior and posterior circulation appears stable since 06/28/2016. 3. The MRI portion of the exam had to be discontinued prior to completion due to patient confusion. Electronically Signed   By: Odessa Fleming M.D.   On: 06/03/2017 10:33   US Carotid Bilateral  Result Date: 06/04/2017 CLINICAL DATA:  72 year old male with acute small right-sided lacunar infarcts. EXAM: BILATERAL CAROTID DUPLEX ULTRASOUND TECHNIQUE: Luckenbach scale imaging, color Doppler and duplex ultrasound were performed of bilateral carotid and vertebral arteries in the neck. COMPARISON:  None. FINDINGS: Criteria: Quantification of carotid stenosis is based on velocity parameters that correlate the residual internal carotid diameter with NASCET-based stenosis levels, using the diameter of the distal internal carotid lumen as the denominator for stenosis measurement. The following velocity measurements were obtained: RIGHT ICA:  111/28 cm/sec CCA:  84/14 cm/sec SYSTOLIC ICA/CCA RATIO:  1.3 DIASTOLIC ICA/CCA RATIO:  2.0 ECA:  89 cm/sec LEFT ICA:  138/36 cm/sec CCA:  78/15 cm/sec SYSTOLIC ICA/CCA RATIO:  1.8 DIASTOLIC ICA/CCA RATIO:  2.4 ECA:  107 cm/sec RIGHT CAROTID ARTERY: Heterogeneous atherosclerotic plaque throughout the common carotid artery extending into the internal carotid artery. By peak systolic velocity criteria, the estimated stenosis remains less than 50%. RIGHT VERTEBRAL ARTERY:  Patent with antegrade flow. LEFT CAROTID ARTERY: Heterogeneous atherosclerotic plaque in the proximal internal carotid artery. By peak systolic velocity criteria, the estimated stenosis falls in the 50-69% diameter range. LEFT VERTEBRAL ARTERY:  Patent with normal antegrade  flow. IMPRESSION: 1. Mild (1-49%) stenosis proximal right internal carotid artery secondary  to heterogenous atherosclerotic plaque. 2. Moderate (50-69%) stenosis proximal left internal carotid artery secondary to heterogenous atherosclerotic plaque. 3. Vertebral arteries are patent with antegrade flow. Signed, Sterling Big, MD [facility] Electronically Signed   By: Malachy Moan M.D.   On: 06/04/2017 08:01   Ct Maxillofacial W Contrast  Result Date: 05/29/2017 CLINICAL DATA:  Mass, lump, or swelling of the maxillofacial structures. EXAM: CT MAXILLOFACIAL WITH CONTRAST TECHNIQUE: Multidetector CT imaging of the maxillofacial structures was performed with intravenous contrast. Multiplanar CT image reconstructions were also generated. CONTRAST:  75mL ISOVUE-300 IOPAMIDOL (ISOVUE-300) INJECTION 61% COMPARISON:  Head CT 06/28/2016 FINDINGS: Osseous: Periapical erosions around teeth 23, 24, and 29. The latter periapical erosion extends through the buccal cortex and there is regional soft tissue inflammation. No drainable collection. Cervical facet spurring. Orbits: Bilateral cataract resection.  No acute finding. Sinuses: Clear of fluid levels. There is mild mucosal thickening on the floors of the maxillary sinuses, possible retention cysts. Soft tissues: Cellulitic changes around the right mandible as noted above. No floor of mouth inflammation. Atherosclerotic calcification. Limited intracranial: Negative IMPRESSION: Odontogenic soft tissue infection related to tooth 29. Negative for abscess. Electronically Signed   By: Marnee Spring M.D.   On: 05/29/2017 07:26   Dg Chest Port 1 View  Result Date: 06/12/2017 CLINICAL DATA:  CHF. History of coronary artery disease and CABG, CHF, diabetes, hypertension, nonsmoker. EXAM: PORTABLE CHEST 1 VIEW COMPARISON:  Portable chest x-[place] of June 11, 2017 FINDINGS: The lungs are adequately inflated. The  interstitial markings remain increased and are slightly more conspicuous today. Hazy increased density is developed at the left lung base. The cardiac silhouette is enlarged. The pulmonary vascularity is engorged and less distinct. There are post CABG changes. IMPRESSION: Mild worsening of CHF. There is likely layering of pleural fluid posteriorly on the left. Electronically Signed   By: David  [place] M.D.   On: 06/12/2017 10:09   Dg Chest Port 1 View  Result Date: 06/11/2017 CLINICAL DATA:  Congestive failure EXAM: PORTABLE CHEST 1 VIEW COMPARISON:  05/31/2017 FINDINGS: Cardiac shadow is mildly enlarged. Postsurgical changes are again seen. Mild vascular congestion is noted. No sizable infiltrate or effusion is seen. No bony abnormality is noted. IMPRESSION: Mild congestive failure. Electronically Signed   By: Alcide Clever M.D.   On: 06/11/2017 14:31   Dg Chest Port 1 View  Result Date: 05/29/2017 CLINICAL DATA:  Acute onset of fever, cough and vomiting. EXAM: PORTABLE CHEST 1 VIEW COMPARISON:  Chest radiograph performed 04/19/2017 FINDINGS: The lungs are well-aerated and clear. There is no evidence of focal opacification, pleural effusion or pneumothorax. The cardiomediastinal silhouette is borderline normal in size. The patient is status post median sternotomy, with evidence of prior CABG. No acute osseous abnormalities are seen. IMPRESSION: No acute cardiopulmonary process seen. Electronically Signed   By: Roanna Raider M.D.   On: 05/29/2017 06:04   Dg Swallowing Func-speech Pathology  Result Date: 06/04/2017 Objective Swallowing Evaluation: Type of Study: MBS-Modified Barium Swallow Study  Patient Details Name: LANDYNN DUPLER MRN: [id number] Date of Birth: 03/02/1946 Today's Date: 06/04/2017 Time: SLP Start Time (ACUTE ONLY): 1348 -SLP Stop Time (ACUTE ONLY): 1417 SLP Time Calculation (min) (ACUTE ONLY): 29 min Past Medical History: Past Medical History: Diagnosis Date . Adjustment disorder with mixed  emotional features  . Atrial flutter (HCC)  . CAD (coronary artery disease)  . Depression  . Diabetes mellitus type II, uncontrolled (HCC)  . Diabetic peripheral neuropathy (  HCC)  . Erectile dysfunction  . GERD (gastroesophageal reflux disease)  . Herpes zoster  . Hyperlipidemia  . Hypertension  . Obesity  . Osteoarthritis  . Postherpetic neuralgia  . Sleep apnea  Past Surgical History: Past Surgical History: Procedure Laterality Date . CARDIOVERSION N/A 11/10/2015  Procedure: CARDIOVERSION;  Surgeon: Laurey Morale, MD;  Location: [facility] ENDOSCOPY;  Service: Cardiovascular;  Laterality: N/A; . CARDIOVERSION N/A 02/21/2016  Procedure: CARDIOVERSION;  Surgeon: Lewayne Bunting, MD;  Location: [facility] ENDOSCOPY;  Service: Cardiovascular;  Laterality: N/A; . CORONARY ARTERY BYPASS GRAFT  1996  eith a LIMA to the LAD, saphenous vein graft to the acute marginal, saphenous vein graft to the PDA and saphenous vein graft to the circumflex. [name] KNEE ARTHROSCOPY  05/2004  right knee . REPLACEMENT TOTAL KNEE BILATERAL   . SHOULDER SURGERY   HPI: This is 72 year old white male who presents with fever and shaking along with facial swelling. It appears the patient had the dental abscess associated with cellulitis. He has been worked up extensively. Appears up blood cultures have not been positive. He has had x-[place] showing the pneumonia presumably due to aspiration. Patient has been encephalopathic and agitated which resulted in the current consultation. He had a stroke about a year ago. The wife reports that he had significant right-sided weakness with a stroke and did have some residual problems from this. She tells me that he did have a footdrop on the right related to his stroke although he has had some neuropathy which may have contributed. Neuropathy is a more longstanding issue than the recent stroke a year ago. He is noted to have a mild dysarthria which wife tells me fluctuates and is probably his baseline. The patient's  agitation have been occurring during the day and also at nighttime but may have been worse at nighttime and worse with of his CPAP machine while he has been hospitalized. He appears to have improved significantly this morning however. She reports that he is pretty close to baseline. The patient himself has little recollection of the events while he is hospitalized in the for canal provide any history. However, the wife does not report any clear focal new symptoms such is worsening dysarthria or worsening weakness on the right side. No left-sided weakness is reported. No visual changes are reported. The review of systems otherwise negative. Multiple infarcts both acute and chronic with increased risk of vascular dementia. Wife reports that he often became "choked up" after eating or drinking. CT chest performed whichconfirmed evidence of aspiration pneumonia. Antibiotic coverage has been broadened to vancomycin and Zosyn. Speech therapy consulted for swallow evaluation.  Subjective: "You haven't given me anything to eat." (SLP just did BSE with foods) Assessment / Plan / Recommendation CHL IP CLINICAL IMPRESSIONS 06/04/2017 Clinical Impression Pt presents with moderate pharyngeal phase dysphagia characterized by delay in swallow initiation with swallow trigger after filling the valleculae, reduced hyolaryngeal excursion and epiglottic deflection resulting in penetration during and after the swallow with thins, nectars- trace, and slight with honey-thick liquids. Chin tuck was implemented with mod/max verbal and tactile cues and was consistently effective in preventing aspiration with thin liquids, however Pt unable to complete without cues due to cognitive status. Pt with trace aspiration with large cups sips and straw sips after the swallow from penetrant moving down anterior aspect of vocal folds; penetration occurred in greater amounts with straw sips thin and was not removed until SLP cued Pt for strong  cough. Given current PNA and altered  mental status, will change diet to nectar-thick liquids and continue regular textures with follow up dysphagia intervention at receiving facility. SLP has not been able to speak with Pt's wife yet, however chart review reveals that wife has noted frequent coughing with meals in the recent past. Prognosis to upgrade to thin liquids is good post discharge as Pt has a very strong and effective cough, but needs cues to do so. A chin tuck was effective with thin liquids and can be used if Pt able to successfully implement (he is not able to at this time due to cognitive status). Pharyngeal phase of swallow was WNL with puree and regular textures. Recommend Regular textures and nectar-thick liquids with po meds whole one at a time with NTL or in puree. OK for thin liquid trials with SLP with strict use of chin tuck. SLP Visit Diagnosis Dysphagia, oropharyngeal phase (R13.12) Attention and concentration deficit following -- Frontal lobe and executive function deficit following -- Impact on safety and function Moderate aspiration risk   CHL IP TREATMENT RECOMMENDATION 06/04/2017 Treatment Recommendations Therapy as outlined in treatment plan below   Prognosis 06/04/2017 Prognosis for Safe Diet Advancement Fair Barriers to Reach Goals Cognitive deficits Barriers/Prognosis Comment -- CHL IP DIET RECOMMENDATION 06/04/2017 SLP Diet Recommendations Regular solids;Nectar thick liquid Liquid Administration via Cup;No straw Medication Administration Whole meds with liquid Compensations Slow rate;Small sips/bites;Clear throat intermittently Postural Changes Seated upright at 90 degrees;Remain semi-upright after after feeds/meals (Comment)   CHL IP OTHER RECOMMENDATIONS 06/04/2017 Recommended Consults -- Oral Care Recommendations Oral care BID Other Recommendations Clarify dietary restrictions   CHL IP FOLLOW UP RECOMMENDATIONS 06/04/2017 Follow up Recommendations Skilled Nursing facility;Inpatient  Rehab   CHL IP FREQUENCY AND DURATION 06/04/2017 Speech Therapy Frequency (ACUTE ONLY) min 2x/week Treatment Duration 1 week      CHL IP ORAL PHASE 06/04/2017 Oral Phase WFL Oral - Pudding Teaspoon -- Oral - Pudding Cup -- Oral - Honey Teaspoon -- Oral - Honey Cup -- Oral - Nectar Teaspoon -- Oral - Nectar Cup -- Oral - Nectar Straw -- Oral - Thin Teaspoon -- Oral - Thin Cup -- Oral - Thin Straw -- Oral - Puree -- Oral - Mech Soft -- Oral - Regular -- Oral - Multi-Consistency -- Oral - Pill -- Oral Phase - Comment --  CHL IP PHARYNGEAL PHASE 06/04/2017 Pharyngeal Phase Impaired Pharyngeal- Pudding Teaspoon -- Pharyngeal -- Pharyngeal- Pudding Cup -- Pharyngeal -- Pharyngeal- Honey Teaspoon -- Pharyngeal -- Pharyngeal- Honey Cup -- Pharyngeal -- Pharyngeal- Nectar Teaspoon -- Pharyngeal -- Pharyngeal- Nectar Cup -- Pharyngeal Material does not enter airway;Material enters airway, remains ABOVE vocal cords then ejected out;Material enters airway, remains ABOVE vocal cords and not ejected out Pharyngeal- Nectar Straw -- Pharyngeal -- Pharyngeal- Thin Teaspoon Delayed swallow initiation-vallecula Pharyngeal -- Pharyngeal- Thin Cup Delayed swallow initiation-vallecula;Reduced epiglottic inversion;Reduced anterior laryngeal mobility;Reduced laryngeal elevation;Reduced airway/laryngeal closure;Penetration/Aspiration during swallow;Penetration/Apiration after swallow;Trace aspiration;Pharyngeal residue - valleculae Pharyngeal Material does not enter airway;Material enters airway, remains ABOVE vocal cords then ejected out;Material enters airway, passes BELOW cords then ejected out;Material enters airway, remains ABOVE vocal cords and not ejected out;Material enters airway, CONTACTS cords and then ejected out Pharyngeal- Thin Straw Delayed swallow initiation-pyriform sinuses;Reduced anterior laryngeal mobility;Reduced laryngeal elevation;Reduced airway/laryngeal closure;Penetration/Aspiration during  swallow;Penetration/Apiration after swallow;Trace aspiration;Pharyngeal residue - valleculae;Pharyngeal residue - pyriform Pharyngeal Material enters airway, remains ABOVE vocal cords then ejected out;Material enters airway, CONTACTS cords and then ejected out;Material enters airway, CONTACTS cords and not ejected out;Material enters airway, passes BELOW cords then  ejected out Pharyngeal- Puree WFL Pharyngeal -- Pharyngeal- Mechanical Soft -- Pharyngeal -- Pharyngeal- Regular WFL Pharyngeal -- Pharyngeal- Multi-consistency -- Pharyngeal -- Pharyngeal- Pill WFL Pharyngeal -- Pharyngeal Comment --  CHL IP CERVICAL ESOPHAGEAL PHASE 06/04/2017 Cervical Esophageal Phase WFL Pudding Teaspoon -- Pudding Cup -- Honey Teaspoon -- Honey Cup -- Nectar Teaspoon -- Nectar Cup -- Nectar Straw -- Thin Teaspoon -- Thin Cup -- Thin Straw -- Puree -- Mechanical Soft -- Regular -- Multi-consistency -- Pill -- Cervical Esophageal Comment -- No flowsheet data found. Thank you, Havery Moros, CCC-SLP [phone] PORTER,DABNEY 06/04/2017, 6:34 PM              Mr Brain Limited Wo Contrast  Result Date: 06/03/2017 CLINICAL DATA:  72 year old male with altered mental status and confusion for 2 days. Age indeterminate small vessel ischemia in the right external capsule on head CT yesterday. EXAM: MRI HEAD WITHOUT CONTRAST MRA HEAD WITHOUT CONTRAST TECHNIQUE: Multiplanar, multiecho pulse sequences of the brain and surrounding structures were obtained without intravenous contrast. Angiographic images of the head were obtained using MRA technique without contrast. COMPARISON:  Head CT without contrast 06/02/2017. Brain MRI and intracranial MRA 06/28/2016. FINDINGS: MRI HEAD FINDINGS The examination had to be discontinued prior to completion due to patient confusion. Axial and coronal diffusion weighted imaging, sagittal T1 and axial T2 weighted imaging only was obtained. Brain: The posterior limb right internal capsule area of hypodensity  seen by CT does correspond to an oval 11 millimeter area of restricted diffusion (series 3, image 87). There is associated T2 hyperintensity, with no mass effect or evidence of associated hemorrhage. There is also a tiny additional subcortical white matter focus of restricted diffusion in the superolateral right occipital lobe bordering the posterior temporal lobe on series 3, image 85. No contralateral or posterior fossa restricted diffusion. Stable cerebral volume. Chronic lacunar infarcts in the left superior cerebellum, bilateral thalami, left corona radiata. No no midline shift, mass effect, evidence of mass lesion, ventriculomegaly, extra-axial collection or acute intracranial hemorrhage. Cervicomedullary junction and pituitary are within normal limits. Vascular: Major intracranial vascular flow voids are stable since 2018. Skull and upper cervical spine: Stable. Sinuses/Orbits: Stable. Other: Stable trace left mastoid fluid. Small volume retained secretions in the nasopharynx. MRA HEAD FINDINGS Stable antegrade flow in the posterior circulation. Chronic irregularity and mild stenosis in the right vertebral artery V4 segment. Both PICA origins remain patent. Negative distal left vertebral artery. Patent vertebrobasilar junction and basilar artery without stenosis. SCA and PCA origins remain normal. PCA branches are stable and within normal limits. Both posterior communicating arteries are present. Stable antegrade flow in both ICA siphons. Mild siphon irregularity with no stenosis. Ophthalmic and posterior communicating artery origins remain normal. Patent carotid termini. MCA and ACA origins remain normal. The left A1 segment is mildly dominant as before. The posterior communicating scratched at the anterior communicating artery is fenestrated, normal variant. Visible ACA branches are stable and within normal limits. The left MCA M1 segment and bifurcation remain patent. There is mild irregularity and  stenosis at the distal M1 and bifurcation, more affecting the anterior left M2 origin. Stable visible left MCA branches. The right MCA M1 segment and trifurcation remain patent. There is mild irregularity and stenosis at the distal right M1 which is stable (series 103, image 11). The visible right MCA branches are stable. IMPRESSION: 1. Two small acute lacunar infarcts in the right hemisphere, the larger at the right external capsule corresponding to the indeterminate CT finding yesterday. No  associated hemorrhage or mass effect. 2. No intracranial artery occlusion identified. Mild intracranial atherosclerosis and stenosis in both the anterior and posterior circulation appears stable since 06/28/2016. 3. The MRI portion of the exam had to be discontinued prior to completion due to patient confusion. Electronically Signed   By: Odessa Fleming M.D.   On: 06/03/2017 10:33     Subjective: Pt asking to leave the hospital after breakfast, no other complaints, no shortness of breath or chest pain.  Has been eating better, but still refusing to take any medications per nursing.   Discharge Exam: Vitals:   06/14/17 1539 06/15/17 0426  BP: 133/61 (!) 159/82  Pulse: 74 79  Resp: 19 18  Temp: 98 F (36.7 C) 98.5 F (36.9 C)  SpO2: 94% 98%   Vitals:   06/14/17 0831 06/14/17 1100 06/14/17 1539 06/15/17 0426  BP:   133/61 (!) 159/82  Pulse:   74 79  Resp:   19 18  Temp:   98 F (36.7 C) 98.5 F (36.9 C)  TempSrc:   Oral Oral  SpO2: 96% 100% 94% 98%  Weight:      Height:       General: Pt is alert, awake, not in acute distress, he is oriented to person, place, situation, confused about times Cardiovascular: normal S1/S2 +, no rubs, no gallops Respiratory: CTA bilaterally, no wheezing, no rhonchi Abdominal: Soft, NT, ND, bowel sounds + Extremities: no edema, no cyanosis. Psychiatry: Pt is calm, normal affect.    The results of significant diagnostics from this hospitalization (including imaging,  microbiology, ancillary and laboratory) are listed below for reference.     Microbiology: No results found for this or any previous visit (from the past 240 hour(s)).   Labs: BNP (last 3 results) Recent Labs    04/19/17 1915  BNP 495*   Basic Metabolic Panel: Recent Labs  Lab 06/10/17 0541 06/12/17 0513 06/14/17 0435  NA 143 145  --   K 3.4* 3.8  --   CL 109 107  --   CO2 25 27  --   GLUCOSE 181* 144*  --   BUN 24* 24*  --   CREATININE 0.91 1.12 0.91  CALCIUM 8.6* 8.9  --    Liver Function Tests: No results for input(s): AST, ALT, ALKPHOS, BILITOT, PROT, ALBUMIN in the last 168 hours. No results for input(s): LIPASE, AMYLASE in the last 168 hours. No results for input(s): AMMONIA in the last 168 hours. CBC: Recent Labs  Lab 06/10/17 0541  WBC 8.3  HGB 8.5*  HCT 28.6*  MCV 102.1*  PLT 177   Cardiac Enzymes: No results for input(s): CKTOTAL, CKMB, CKMBINDEX, TROPONINI in the last 168 hours. BNP: Invalid input(s): POCBNP CBG: Recent Labs  Lab 06/13/17 2109 06/14/17 0741 06/14/17 1117 06/14/17 1629 06/15/17 0745  GLUCAP 78 244* 75 83 229*   D-Dimer No results for input(s): DDIMER in the last 72 hours. Hgb A1c No results for input(s): HGBA1C in the last 72 hours. Lipid Profile No results for input(s): CHOL, HDL, LDLCALC, TRIG, CHOLHDL, LDLDIRECT in the last 72 hours. Thyroid function studies No results for input(s): TSH, T4TOTAL, T3FREE, THYROIDAB in the last 72 hours.  Invalid input(s): FREET3 Anemia work up No results for input(s): VITAMINB12, FOLATE, FERRITIN, TIBC, IRON, RETICCTPCT in the last 72 hours. Urinalysis    Component Value Date/Time   COLORURINE AMBER (A) 05/29/2017 0523   APPEARANCEUR HAZY (A) 05/29/2017 0523   LABSPEC 1.020 05/29/2017 0523   PHURINE  5.0 05/29/2017 0523   GLUCOSEU NEGATIVE 05/29/2017 0523   GLUCOSEU NEGATIVE 05/27/2007 0844   HGBUR NEGATIVE 05/29/2017 0523   HGBUR negative 05/05/2010 1041   BILIRUBINUR NEGATIVE  05/29/2017 0523   KETONESUR NEGATIVE 05/29/2017 0523   PROTEINUR 30 (A) 05/29/2017 0523   UROBILINOGEN 0.2 05/05/2010 1041   NITRITE NEGATIVE 05/29/2017 0523   LEUKOCYTESUR NEGATIVE 05/29/2017 0523   Sepsis Labs Invalid input(s): PROCALCITONIN,  WBC,  LACTICIDVEN Microbiology No results found for this or any previous visit (from the past 240 hour(s)).  Time coordinating discharge: 45 mins  SIGNED:  Standley Dakins, MD  [facility] 06/15/2017, 10:35 AM Pager [phone]  If 7PM-7AM, please contact night-coverage [email or web address] Password [email or web address]

## 2017-06-16 DIAGNOSIS — I10 Essential (primary) hypertension: Secondary | ICD-10-CM | POA: Diagnosis not present

## 2017-06-16 DIAGNOSIS — I2510 Atherosclerotic heart disease of native coronary artery without angina pectoris: Secondary | ICD-10-CM | POA: Diagnosis present

## 2017-06-16 DIAGNOSIS — R34 Anuria and oliguria: Secondary | ICD-10-CM | POA: Diagnosis not present

## 2017-06-16 DIAGNOSIS — Z515 Encounter for palliative care: Secondary | ICD-10-CM | POA: Diagnosis not present

## 2017-06-16 DIAGNOSIS — R531 Weakness: Secondary | ICD-10-CM | POA: Diagnosis not present

## 2017-06-16 DIAGNOSIS — E119 Type 2 diabetes mellitus without complications: Secondary | ICD-10-CM | POA: Diagnosis present

## 2017-06-16 DIAGNOSIS — R0602 Shortness of breath: Secondary | ICD-10-CM | POA: Diagnosis not present

## 2017-06-16 DIAGNOSIS — Z66 Do not resuscitate: Secondary | ICD-10-CM | POA: Diagnosis not present

## 2017-06-16 DIAGNOSIS — R42 Dizziness and giddiness: Secondary | ICD-10-CM | POA: Diagnosis not present

## 2017-06-16 DIAGNOSIS — I110 Hypertensive heart disease with heart failure: Secondary | ICD-10-CM | POA: Diagnosis not present

## 2017-06-16 DIAGNOSIS — I5032 Chronic diastolic (congestive) heart failure: Secondary | ICD-10-CM | POA: Diagnosis present

## 2017-06-16 DIAGNOSIS — R4182 Altered mental status, unspecified: Secondary | ICD-10-CM | POA: Diagnosis not present

## 2017-06-16 DIAGNOSIS — K5900 Constipation, unspecified: Secondary | ICD-10-CM | POA: Diagnosis present

## 2017-06-16 DIAGNOSIS — Z951 Presence of aortocoronary bypass graft: Secondary | ICD-10-CM | POA: Diagnosis not present

## 2017-06-16 DIAGNOSIS — F0391 Unspecified dementia with behavioral disturbance: Secondary | ICD-10-CM | POA: Diagnosis not present

## 2017-06-16 DIAGNOSIS — F0390 Unspecified dementia without behavioral disturbance: Secondary | ICD-10-CM | POA: Diagnosis not present

## 2017-06-16 DIAGNOSIS — I499 Cardiac arrhythmia, unspecified: Secondary | ICD-10-CM | POA: Diagnosis not present

## 2017-06-16 DIAGNOSIS — R296 Repeated falls: Secondary | ICD-10-CM | POA: Diagnosis not present

## 2017-06-16 DIAGNOSIS — E785 Hyperlipidemia, unspecified: Secondary | ICD-10-CM | POA: Diagnosis present

## 2017-06-16 DIAGNOSIS — R451 Restlessness and agitation: Secondary | ICD-10-CM | POA: Diagnosis not present

## 2017-06-16 DIAGNOSIS — E860 Dehydration: Secondary | ICD-10-CM | POA: Diagnosis not present

## 2017-06-16 DIAGNOSIS — Z9861 Coronary angioplasty status: Secondary | ICD-10-CM | POA: Diagnosis not present

## 2017-06-16 DIAGNOSIS — E1140 Type 2 diabetes mellitus with diabetic neuropathy, unspecified: Secondary | ICD-10-CM | POA: Diagnosis present

## 2017-06-16 DIAGNOSIS — E870 Hyperosmolality and hypernatremia: Secondary | ICD-10-CM | POA: Diagnosis not present

## 2017-06-16 DIAGNOSIS — Z96653 Presence of artificial knee joint, bilateral: Secondary | ICD-10-CM | POA: Diagnosis present

## 2017-06-17 ENCOUNTER — Telehealth: Payer: Self-pay | Admitting: Family Medicine

## 2017-06-17 NOTE — Telephone Encounter (Signed)
I tried to reach pt on cell & home number, no answer or option to leave a message, will try again later.

## 2017-06-18 NOTE — Telephone Encounter (Signed)
I spoke with wife and she declined the TCM call at this time, [place] [facility] [place] [facility], not sure when they will be back in [place], said to tell Kandee KeenCory thanks for all of his care given, they appreciate him.

## 2017-06-25 DIAGNOSIS — R0609 Other forms of dyspnea: Secondary | ICD-10-CM | POA: Diagnosis not present

## 2017-06-25 DIAGNOSIS — G4709 Other insomnia: Secondary | ICD-10-CM | POA: Diagnosis not present

## 2017-06-25 DIAGNOSIS — Z515 Encounter for palliative care: Secondary | ICD-10-CM | POA: Diagnosis not present

## 2017-06-25 DIAGNOSIS — Z66 Do not resuscitate: Secondary | ICD-10-CM | POA: Diagnosis not present

## 2017-06-25 DIAGNOSIS — F418 Other specified anxiety disorders: Secondary | ICD-10-CM | POA: Diagnosis not present

## 2017-06-28 DIAGNOSIS — R55 Syncope and collapse: Secondary | ICD-10-CM | POA: Diagnosis not present

## 2017-06-28 DIAGNOSIS — R0602 Shortness of breath: Secondary | ICD-10-CM | POA: Diagnosis not present

## 2017-06-29 DIAGNOSIS — I361 Nonrheumatic tricuspid (valve) insufficiency: Secondary | ICD-10-CM | POA: Diagnosis not present

## 2017-06-29 DIAGNOSIS — I517 Cardiomegaly: Secondary | ICD-10-CM | POA: Diagnosis not present

## 2017-06-29 DIAGNOSIS — I340 Nonrheumatic mitral (valve) insufficiency: Secondary | ICD-10-CM | POA: Diagnosis not present

## 2017-06-29 DIAGNOSIS — R4182 Altered mental status, unspecified: Secondary | ICD-10-CM | POA: Diagnosis not present

## 2017-06-29 DIAGNOSIS — F0391 Unspecified dementia with behavioral disturbance: Secondary | ICD-10-CM | POA: Diagnosis not present

## 2017-06-29 DIAGNOSIS — R001 Bradycardia, unspecified: Secondary | ICD-10-CM | POA: Diagnosis not present

## 2017-06-29 DIAGNOSIS — I482 Chronic atrial fibrillation: Secondary | ICD-10-CM | POA: Diagnosis not present

## 2017-06-29 DIAGNOSIS — I5033 Acute on chronic diastolic (congestive) heart failure: Secondary | ICD-10-CM | POA: Diagnosis not present

## 2017-06-29 DIAGNOSIS — R55 Syncope and collapse: Secondary | ICD-10-CM | POA: Diagnosis not present

## 2017-06-30 DIAGNOSIS — I482 Chronic atrial fibrillation: Secondary | ICD-10-CM | POA: Diagnosis not present

## 2017-06-30 DIAGNOSIS — R001 Bradycardia, unspecified: Secondary | ICD-10-CM | POA: Diagnosis not present

## 2017-06-30 DIAGNOSIS — I5033 Acute on chronic diastolic (congestive) heart failure: Secondary | ICD-10-CM | POA: Diagnosis not present

## 2017-06-30 DIAGNOSIS — R55 Syncope and collapse: Secondary | ICD-10-CM | POA: Diagnosis not present

## 2017-06-30 DIAGNOSIS — F0391 Unspecified dementia with behavioral disturbance: Secondary | ICD-10-CM | POA: Diagnosis not present

## 2017-07-01 DIAGNOSIS — I495 Sick sinus syndrome: Secondary | ICD-10-CM | POA: Diagnosis not present

## 2017-07-01 DIAGNOSIS — E1122 Type 2 diabetes mellitus with diabetic chronic kidney disease: Secondary | ICD-10-CM | POA: Diagnosis not present

## 2017-07-01 DIAGNOSIS — I10 Essential (primary) hypertension: Secondary | ICD-10-CM | POA: Diagnosis not present

## 2017-07-01 DIAGNOSIS — F0391 Unspecified dementia with behavioral disturbance: Secondary | ICD-10-CM | POA: Diagnosis not present

## 2017-07-01 DIAGNOSIS — R001 Bradycardia, unspecified: Secondary | ICD-10-CM | POA: Diagnosis not present

## 2017-07-01 DIAGNOSIS — I255 Ischemic cardiomyopathy: Secondary | ICD-10-CM | POA: Diagnosis not present

## 2017-07-01 DIAGNOSIS — I482 Chronic atrial fibrillation: Secondary | ICD-10-CM | POA: Diagnosis not present

## 2017-07-01 DIAGNOSIS — I5033 Acute on chronic diastolic (congestive) heart failure: Secondary | ICD-10-CM | POA: Diagnosis not present

## 2017-07-01 DIAGNOSIS — R55 Syncope and collapse: Secondary | ICD-10-CM | POA: Diagnosis not present

## 2017-07-02 DIAGNOSIS — I482 Chronic atrial fibrillation: Secondary | ICD-10-CM | POA: Diagnosis not present

## 2017-07-02 DIAGNOSIS — S6990XA Unspecified injury of unspecified wrist, hand and finger(s), initial encounter: Secondary | ICD-10-CM | POA: Diagnosis not present

## 2017-07-02 DIAGNOSIS — I5033 Acute on chronic diastolic (congestive) heart failure: Secondary | ICD-10-CM | POA: Diagnosis not present

## 2017-07-02 DIAGNOSIS — R55 Syncope and collapse: Secondary | ICD-10-CM | POA: Diagnosis not present

## 2017-07-02 DIAGNOSIS — R001 Bradycardia, unspecified: Secondary | ICD-10-CM | POA: Diagnosis not present

## 2017-07-02 DIAGNOSIS — F0391 Unspecified dementia with behavioral disturbance: Secondary | ICD-10-CM | POA: Diagnosis not present

## 2017-07-02 DIAGNOSIS — M79621 Pain in right upper arm: Secondary | ICD-10-CM | POA: Diagnosis not present

## 2017-07-03 DIAGNOSIS — F0391 Unspecified dementia with behavioral disturbance: Secondary | ICD-10-CM | POA: Diagnosis not present

## 2017-07-03 DIAGNOSIS — R001 Bradycardia, unspecified: Secondary | ICD-10-CM | POA: Diagnosis not present

## 2017-07-03 DIAGNOSIS — R55 Syncope and collapse: Secondary | ICD-10-CM | POA: Diagnosis not present

## 2017-07-03 DIAGNOSIS — I5033 Acute on chronic diastolic (congestive) heart failure: Secondary | ICD-10-CM | POA: Diagnosis not present

## 2017-07-04 ENCOUNTER — Ambulatory Visit: Payer: Self-pay | Admitting: Adult Health

## 2017-07-04 DIAGNOSIS — R55 Syncope and collapse: Secondary | ICD-10-CM | POA: Diagnosis not present

## 2017-07-04 DIAGNOSIS — F0391 Unspecified dementia with behavioral disturbance: Secondary | ICD-10-CM | POA: Diagnosis not present

## 2017-07-04 DIAGNOSIS — I5033 Acute on chronic diastolic (congestive) heart failure: Secondary | ICD-10-CM | POA: Diagnosis not present

## 2017-07-04 DIAGNOSIS — R001 Bradycardia, unspecified: Secondary | ICD-10-CM | POA: Diagnosis not present

## 2017-07-05 DIAGNOSIS — I5033 Acute on chronic diastolic (congestive) heart failure: Secondary | ICD-10-CM | POA: Diagnosis not present

## 2017-07-05 DIAGNOSIS — F0391 Unspecified dementia with behavioral disturbance: Secondary | ICD-10-CM | POA: Diagnosis not present

## 2017-07-05 DIAGNOSIS — R001 Bradycardia, unspecified: Secondary | ICD-10-CM | POA: Diagnosis not present

## 2017-07-05 DIAGNOSIS — R55 Syncope and collapse: Secondary | ICD-10-CM | POA: Diagnosis not present

## 2017-07-06 DIAGNOSIS — R55 Syncope and collapse: Secondary | ICD-10-CM | POA: Diagnosis not present

## 2017-07-06 DIAGNOSIS — R001 Bradycardia, unspecified: Secondary | ICD-10-CM | POA: Diagnosis not present

## 2017-07-06 DIAGNOSIS — I5033 Acute on chronic diastolic (congestive) heart failure: Secondary | ICD-10-CM | POA: Diagnosis not present

## 2017-07-06 DIAGNOSIS — F0391 Unspecified dementia with behavioral disturbance: Secondary | ICD-10-CM | POA: Diagnosis not present

## 2017-07-07 DIAGNOSIS — R55 Syncope and collapse: Secondary | ICD-10-CM | POA: Diagnosis not present

## 2017-07-07 DIAGNOSIS — I5033 Acute on chronic diastolic (congestive) heart failure: Secondary | ICD-10-CM | POA: Diagnosis not present

## 2017-07-07 DIAGNOSIS — F0391 Unspecified dementia with behavioral disturbance: Secondary | ICD-10-CM | POA: Diagnosis not present

## 2017-07-07 DIAGNOSIS — R001 Bradycardia, unspecified: Secondary | ICD-10-CM | POA: Diagnosis not present

## 2017-07-08 DIAGNOSIS — R55 Syncope and collapse: Secondary | ICD-10-CM | POA: Diagnosis not present

## 2017-07-08 DIAGNOSIS — I5033 Acute on chronic diastolic (congestive) heart failure: Secondary | ICD-10-CM | POA: Diagnosis not present

## 2017-07-08 DIAGNOSIS — F0391 Unspecified dementia with behavioral disturbance: Secondary | ICD-10-CM | POA: Diagnosis not present

## 2017-07-08 DIAGNOSIS — R001 Bradycardia, unspecified: Secondary | ICD-10-CM | POA: Diagnosis not present

## 2017-07-09 DIAGNOSIS — F0281 Dementia in other diseases classified elsewhere with behavioral disturbance: Secondary | ICD-10-CM | POA: Diagnosis not present

## 2017-07-09 DIAGNOSIS — I5033 Acute on chronic diastolic (congestive) heart failure: Secondary | ICD-10-CM | POA: Diagnosis not present

## 2017-07-09 DIAGNOSIS — I482 Chronic atrial fibrillation: Secondary | ICD-10-CM | POA: Diagnosis not present

## 2017-07-09 DIAGNOSIS — E0822 Diabetes mellitus due to underlying condition with diabetic chronic kidney disease: Secondary | ICD-10-CM | POA: Diagnosis not present

## 2017-07-09 DIAGNOSIS — R569 Unspecified convulsions: Secondary | ICD-10-CM | POA: Diagnosis not present

## 2017-07-09 DIAGNOSIS — E785 Hyperlipidemia, unspecified: Secondary | ICD-10-CM | POA: Diagnosis not present

## 2017-07-09 DIAGNOSIS — I10 Essential (primary) hypertension: Secondary | ICD-10-CM | POA: Diagnosis not present

## 2017-07-09 DIAGNOSIS — I639 Cerebral infarction, unspecified: Secondary | ICD-10-CM | POA: Diagnosis not present

## 2017-07-09 DIAGNOSIS — F0391 Unspecified dementia with behavioral disturbance: Secondary | ICD-10-CM | POA: Diagnosis not present

## 2017-07-09 DIAGNOSIS — R001 Bradycardia, unspecified: Secondary | ICD-10-CM | POA: Diagnosis not present

## 2017-07-09 DIAGNOSIS — R55 Syncope and collapse: Secondary | ICD-10-CM | POA: Diagnosis not present

## 2017-07-10 DIAGNOSIS — E0822 Diabetes mellitus due to underlying condition with diabetic chronic kidney disease: Secondary | ICD-10-CM | POA: Diagnosis not present

## 2017-07-10 DIAGNOSIS — I10 Essential (primary) hypertension: Secondary | ICD-10-CM | POA: Diagnosis not present

## 2017-07-10 DIAGNOSIS — I639 Cerebral infarction, unspecified: Secondary | ICD-10-CM | POA: Diagnosis not present

## 2017-07-10 DIAGNOSIS — I5033 Acute on chronic diastolic (congestive) heart failure: Secondary | ICD-10-CM | POA: Diagnosis not present

## 2017-07-10 DIAGNOSIS — I482 Chronic atrial fibrillation: Secondary | ICD-10-CM | POA: Diagnosis not present

## 2017-07-10 DIAGNOSIS — F0281 Dementia in other diseases classified elsewhere with behavioral disturbance: Secondary | ICD-10-CM | POA: Diagnosis not present

## 2017-07-11 DIAGNOSIS — F0281 Dementia in other diseases classified elsewhere with behavioral disturbance: Secondary | ICD-10-CM | POA: Diagnosis not present

## 2017-07-11 DIAGNOSIS — I10 Essential (primary) hypertension: Secondary | ICD-10-CM | POA: Diagnosis not present

## 2017-07-11 DIAGNOSIS — I5033 Acute on chronic diastolic (congestive) heart failure: Secondary | ICD-10-CM | POA: Diagnosis not present

## 2017-07-11 DIAGNOSIS — E0822 Diabetes mellitus due to underlying condition with diabetic chronic kidney disease: Secondary | ICD-10-CM | POA: Diagnosis not present

## 2017-07-11 DIAGNOSIS — I639 Cerebral infarction, unspecified: Secondary | ICD-10-CM | POA: Diagnosis not present

## 2017-07-11 DIAGNOSIS — I482 Chronic atrial fibrillation: Secondary | ICD-10-CM | POA: Diagnosis not present

## 2017-07-11 NOTE — Progress Notes (Deleted)
HPI: FU CAD and atrial flutter. Abdominal ultrasound in 2003 showed no aneurysm.Patient has had previous PCI of the saphenous vein graft to the PDA.Cardiac cath in Dec of 2011 revealed EF of 50. Continued patency internal mammary to the LAD. Occluded saphenous vein graft to the recent percutaneous intervention to the distal circumflex/PDA territory. Progressive disease in the saphenous vein graft to the OM. Patient evaluated by CVTS. Distal vessels felt to be poor targets. CABG could be considered in the future if PCI options were exhausted. Patient had PCI of the saphenous vein graft to the obtuse marginal. Nuclear study August 2017 showed ejection fraction 48% and no ischemia or infarction. Patient had cardioversion November 10 2015 and December 52017. Patient seen by Dr. Johney FrameAllred for consideration of ablation and rate control recommended. Tikosyn could be considered.Patient had CVA April 2018 and 3/19. Admitted 3/19 with dental infection; became confused; MRI showed CVA; also treated for aspiration pneumoniaEcho 3/19 showed EF 40, moderate LVH, moderate to severe LAE, mild RAE and RVE. Carotid dopplers 3/19 showed 1-49 R and 50-69 L. Since last seen,   Current Outpatient Medications  Medication Sig Dispense Refill  . acetaminophen (TYLENOL) 500 MG tablet Take 2 tablets (1,000 mg total) by mouth every 6 (six) hours as needed. (Patient taking differently: Take 1,000 mg by mouth every 6 (six) hours as needed for mild pain. ) 30 tablet 0  . apixaban (ELIQUIS) 5 MG TABS tablet Take 1 tablet (5 mg total) by mouth 2 (two) times daily. 180 tablet 3  . atorvastatin (LIPITOR) 80 MG tablet Take 1 tablet (80 mg total) by mouth daily. 90 tablet 3  . buPROPion (WELLBUTRIN SR) 150 MG 12 hr tablet Take 1 tablet (150 mg total) by mouth 2 (two) times daily. 180 tablet 1  . donepezil (ARICEPT) 5 MG tablet Take 1 tablet (5 mg total) by mouth every morning. 30 tablet 0  . feeding supplement, ENSURE ENLIVE,  (ENSURE ENLIVE) LIQD Take 237 mLs by mouth 2 (two) times daily between meals. 14220 mL 0  . ferrous sulfate 325 (65 FE) MG tablet Take 325 mg by mouth every other day.    . food thickener (THICK IT) POWD Use as directed to thicken all liquids to nectar consistency prior to drinking 850 g 0  . furosemide (LASIX) 20 MG tablet Take 2 tablets (40 mg total) by mouth daily. 180 tablet 3  . Hypromellose (ARTIFICIAL TEARS OP) Apply 1 drop to eye every 2 (two) hours as needed (Dryness).    . insulin aspart (NOVOLOG) 100 UNIT/ML injection Inject 5-15 Units into the skin 3 (three) times daily before meals. Per sliding scale    . Insulin Glargine (BASAGLAR KWIKPEN) 100 UNIT/ML SOPN Inject 0.25 mLs (25 Units total) into the skin at bedtime. (Patient taking differently: Inject 20 Units into the skin at bedtime. ) 3 pen 11  . isosorbide mononitrate (IMDUR) 30 MG 24 hr tablet Take 1 tablet (30 mg total) by mouth daily. 90 tablet 3  . mirtazapine (REMERON) 15 MG tablet Take 15 mg by mouth at bedtime.    . Multiple Vitamin (MULTIVITAMIN WITH MINERALS) TABS tablet Take 1 tablet by mouth daily.    . Nebivolol HCl (BYSTOLIC) 20 MG TABS Take 1 tablet by mouth daily.    [name]. omeprazole (PRILOSEC) 20 MG capsule TAKE ONE CAPSULE TWICE A DAY BEFORE A MEAL 180 capsule 0  . QUEtiapine (SEROQUEL) 200 MG tablet Take 1 tablet (200 mg total) by mouth every  evening. 30 tablet 0  . tamsulosin (FLOMAX) 0.4 MG CAPS capsule Take 2 capsules (0.8 mg total) by mouth daily. 60 capsule 0  . thiamine 100 MG tablet Take 1 tablet (100 mg total) by mouth daily. 90 tablet 3  . vitamin B-12 (CYANOCOBALAMIN) 1000 MCG tablet Take 2 tablets (2,000 mcg total) by mouth daily. 180 tablet 3   No current facility-administered medications for this visit.      Past Medical History:  Diagnosis Date  . Adjustment disorder with mixed emotional features   . Atrial flutter (HCC)   . CAD (coronary artery disease)   . Depression   . Diabetes mellitus type  II, uncontrolled (HCC)   . Diabetic peripheral neuropathy (HCC)   . Erectile dysfunction   . GERD (gastroesophageal reflux disease)   . Herpes zoster   . Hyperlipidemia   . Hypertension   . Obesity   . Osteoarthritis   . Postherpetic neuralgia   . Sleep apnea     Past Surgical History:  Procedure Laterality Date  . CARDIOVERSION N/A 11/10/2015   Procedure: CARDIOVERSION;  Surgeon: Laurey Morale, MD;  Location: [facility] ENDOSCOPY;  Service: Cardiovascular;  Laterality: N/A;  . CARDIOVERSION N/A 02/21/2016   Procedure: CARDIOVERSION;  Surgeon: Lewayne Bunting, MD;  Location: [facility] ENDOSCOPY;  Service: Cardiovascular;  Laterality: N/A;  . CORONARY ARTERY BYPASS GRAFT  1996   eith a LIMA to the LAD, saphenous vein graft to the acute marginal, saphenous vein graft to the PDA and saphenous vein graft to the circumflex.  [name] KNEE ARTHROSCOPY  05/2004   right knee  . REPLACEMENT TOTAL KNEE BILATERAL    . SHOULDER SURGERY      Social History   Socioeconomic History  . Marital status: Married    Spouse name: [name]  . Number of children: 1  . Years of education: 75  . Highest education level: Not on file  Occupational History  . Occupation: Retired Publishing rights manager: RETIRED  Social Needs  . Financial resource strain: Not on file  . Food insecurity:    Worry: Not on file    Inability: Not on file  . Transportation needs:    Medical: Not on file    Non-medical: Not on file  Tobacco Use  . Smoking status: Never Smoker  . Smokeless tobacco: Never Used  Substance and Sexual Activity  . Alcohol use: Yes    Alcohol/week: 1.2 oz    Types: 2 Standard drinks or equivalent per week    Comment: twice a month.  . Drug use: No  . Sexual activity: Yes    Partners: Female  Lifestyle  . Physical activity:    Days per week: Not on file    Minutes per session: Not on file  . Stress: Not on file  Relationships  . Social connections:    Talks on phone: Not on file    Gets together: Not  on file    Attends religious service: Not on file    Active member of club or organization: Not on file    Attends meetings of clubs or organizations: Not on file    Relationship status: Not on file  . Intimate partner violence:    Fear of current or ex partner: Not on file    Emotionally abused: Not on file    Physically abused: Not on file    Forced sexual activity: Not on file  Other Topics Concern  . Not on file  Social History Narrative   Married Counselling psychologist)   Regular exercise: none   Caffeine use: cup of coffee daily    Retired Naval architect   Caffeine- twice daily          Family History  Problem Relation Age of Onset  . Coronary artery disease Mother        deceased at 94  . Coronary artery disease Father        died age 39  . Stroke Unknown        half brother  . Stroke Brother     ROS: no fevers or chills, productive cough, hemoptysis, dysphasia, odynophagia, melena, hematochezia, dysuria, hematuria, rash, seizure activity, orthopnea, PND, pedal edema, claudication. Remaining systems are negative.  Physical Exam: Well-developed well-nourished in no acute distress.  Skin is warm and dry.  HEENT is normal.  Neck is supple.  Chest is clear to auscultation with normal expansion.  Cardiovascular exam is regular rate and rhythm.  Abdominal exam nontender or distended. No masses palpated. Extremities show no edema. neuro grossly intact  ECG- personally reviewed  A/P  1  Olga Millers, MD

## 2017-07-12 DIAGNOSIS — I482 Chronic atrial fibrillation: Secondary | ICD-10-CM | POA: Diagnosis not present

## 2017-07-12 DIAGNOSIS — F0281 Dementia in other diseases classified elsewhere with behavioral disturbance: Secondary | ICD-10-CM | POA: Diagnosis not present

## 2017-07-12 DIAGNOSIS — I5033 Acute on chronic diastolic (congestive) heart failure: Secondary | ICD-10-CM | POA: Diagnosis not present

## 2017-07-12 DIAGNOSIS — E0822 Diabetes mellitus due to underlying condition with diabetic chronic kidney disease: Secondary | ICD-10-CM | POA: Diagnosis not present

## 2017-07-12 DIAGNOSIS — I10 Essential (primary) hypertension: Secondary | ICD-10-CM | POA: Diagnosis not present

## 2017-07-12 DIAGNOSIS — I639 Cerebral infarction, unspecified: Secondary | ICD-10-CM | POA: Diagnosis not present

## 2017-07-13 DIAGNOSIS — I639 Cerebral infarction, unspecified: Secondary | ICD-10-CM | POA: Diagnosis not present

## 2017-07-13 DIAGNOSIS — F0281 Dementia in other diseases classified elsewhere with behavioral disturbance: Secondary | ICD-10-CM | POA: Diagnosis not present

## 2017-07-13 DIAGNOSIS — I482 Chronic atrial fibrillation: Secondary | ICD-10-CM | POA: Diagnosis not present

## 2017-07-13 DIAGNOSIS — I10 Essential (primary) hypertension: Secondary | ICD-10-CM | POA: Diagnosis not present

## 2017-07-13 DIAGNOSIS — I5033 Acute on chronic diastolic (congestive) heart failure: Secondary | ICD-10-CM | POA: Diagnosis not present

## 2017-07-13 DIAGNOSIS — E0822 Diabetes mellitus due to underlying condition with diabetic chronic kidney disease: Secondary | ICD-10-CM | POA: Diagnosis not present

## 2017-07-14 DIAGNOSIS — I5033 Acute on chronic diastolic (congestive) heart failure: Secondary | ICD-10-CM | POA: Diagnosis not present

## 2017-07-14 DIAGNOSIS — I482 Chronic atrial fibrillation: Secondary | ICD-10-CM | POA: Diagnosis not present

## 2017-07-14 DIAGNOSIS — I10 Essential (primary) hypertension: Secondary | ICD-10-CM | POA: Diagnosis not present

## 2017-07-14 DIAGNOSIS — F0281 Dementia in other diseases classified elsewhere with behavioral disturbance: Secondary | ICD-10-CM | POA: Diagnosis not present

## 2017-07-14 DIAGNOSIS — E0822 Diabetes mellitus due to underlying condition with diabetic chronic kidney disease: Secondary | ICD-10-CM | POA: Diagnosis not present

## 2017-07-14 DIAGNOSIS — I639 Cerebral infarction, unspecified: Secondary | ICD-10-CM | POA: Diagnosis not present

## 2017-07-15 DIAGNOSIS — I482 Chronic atrial fibrillation: Secondary | ICD-10-CM | POA: Diagnosis not present

## 2017-07-15 DIAGNOSIS — F0281 Dementia in other diseases classified elsewhere with behavioral disturbance: Secondary | ICD-10-CM | POA: Diagnosis not present

## 2017-07-15 DIAGNOSIS — I5033 Acute on chronic diastolic (congestive) heart failure: Secondary | ICD-10-CM | POA: Diagnosis not present

## 2017-07-15 DIAGNOSIS — I10 Essential (primary) hypertension: Secondary | ICD-10-CM | POA: Diagnosis not present

## 2017-07-15 DIAGNOSIS — I639 Cerebral infarction, unspecified: Secondary | ICD-10-CM | POA: Diagnosis not present

## 2017-07-15 DIAGNOSIS — E0822 Diabetes mellitus due to underlying condition with diabetic chronic kidney disease: Secondary | ICD-10-CM | POA: Diagnosis not present

## 2017-07-16 ENCOUNTER — Telehealth: Payer: Self-pay | Admitting: Cardiology

## 2017-07-16 DIAGNOSIS — I10 Essential (primary) hypertension: Secondary | ICD-10-CM | POA: Diagnosis not present

## 2017-07-16 DIAGNOSIS — E0822 Diabetes mellitus due to underlying condition with diabetic chronic kidney disease: Secondary | ICD-10-CM | POA: Diagnosis not present

## 2017-07-16 DIAGNOSIS — I482 Chronic atrial fibrillation: Secondary | ICD-10-CM | POA: Diagnosis not present

## 2017-07-16 DIAGNOSIS — I639 Cerebral infarction, unspecified: Secondary | ICD-10-CM | POA: Diagnosis not present

## 2017-07-16 DIAGNOSIS — I5033 Acute on chronic diastolic (congestive) heart failure: Secondary | ICD-10-CM | POA: Diagnosis not present

## 2017-07-16 DIAGNOSIS — F0281 Dementia in other diseases classified elsewhere with behavioral disturbance: Secondary | ICD-10-CM | POA: Diagnosis not present

## 2017-07-16 NOTE — Telephone Encounter (Signed)
Forward to [facility]

## 2017-07-16 NOTE — Telephone Encounter (Signed)
Left message for patient wife 

## 2017-07-16 NOTE — Telephone Encounter (Signed)
New Message:      Pt's wife is calling to let us know the pt has just gotten out of the hosp after having several strokes. Pt is currently in the hands of hospice and she wanted to let Dr. Ronne Binning know due to their excellent service they always provide to her husband. She says "thank you".

## 2017-07-17 ENCOUNTER — Ambulatory Visit: Payer: Self-pay | Admitting: Cardiology

## 2017-07-17 DIAGNOSIS — I5033 Acute on chronic diastolic (congestive) heart failure: Secondary | ICD-10-CM | POA: Diagnosis not present

## 2017-07-17 DIAGNOSIS — E0822 Diabetes mellitus due to underlying condition with diabetic chronic kidney disease: Secondary | ICD-10-CM | POA: Diagnosis not present

## 2017-07-17 DIAGNOSIS — F0281 Dementia in other diseases classified elsewhere with behavioral disturbance: Secondary | ICD-10-CM | POA: Diagnosis not present

## 2017-07-17 DIAGNOSIS — I10 Essential (primary) hypertension: Secondary | ICD-10-CM | POA: Diagnosis not present

## 2017-07-17 DIAGNOSIS — E785 Hyperlipidemia, unspecified: Secondary | ICD-10-CM | POA: Diagnosis not present

## 2017-07-17 DIAGNOSIS — I482 Chronic atrial fibrillation: Secondary | ICD-10-CM | POA: Diagnosis not present

## 2017-07-17 DIAGNOSIS — I639 Cerebral infarction, unspecified: Secondary | ICD-10-CM | POA: Diagnosis not present

## 2017-07-18 DIAGNOSIS — E0822 Diabetes mellitus due to underlying condition with diabetic chronic kidney disease: Secondary | ICD-10-CM | POA: Diagnosis not present

## 2017-07-18 DIAGNOSIS — I482 Chronic atrial fibrillation: Secondary | ICD-10-CM | POA: Diagnosis not present

## 2017-07-18 DIAGNOSIS — I10 Essential (primary) hypertension: Secondary | ICD-10-CM | POA: Diagnosis not present

## 2017-07-18 DIAGNOSIS — I5033 Acute on chronic diastolic (congestive) heart failure: Secondary | ICD-10-CM | POA: Diagnosis not present

## 2017-07-18 DIAGNOSIS — F0281 Dementia in other diseases classified elsewhere with behavioral disturbance: Secondary | ICD-10-CM | POA: Diagnosis not present

## 2017-07-18 DIAGNOSIS — I639 Cerebral infarction, unspecified: Secondary | ICD-10-CM | POA: Diagnosis not present

## 2017-07-19 ENCOUNTER — Telehealth: Payer: Self-pay | Admitting: Pulmonary Disease

## 2017-07-19 NOTE — Telephone Encounter (Signed)
Aware - reviewed chart

## 2017-07-19 NOTE — Telephone Encounter (Signed)
Spoke with patient's wife [name], offered my condolences. She just wanted to let Dr. Vassie Loll know. Will route to him so he is aware.

## 2017-07-23 ENCOUNTER — Ambulatory Visit: Payer: Self-pay | Admitting: Pulmonary Disease

## 2017-07-31 ENCOUNTER — Ambulatory Visit: Payer: Medicare Other | Admitting: Podiatry

## 2017-08-17 DEATH — deceased

## 2018-05-04 IMAGING — CT CT HEAD W/O CM
3 series · 14 of 47 positions shown, 16 images · non-contrast
Comparison: December 07, 2015.

CLINICAL DATA: Pain following fall

EXAM:
CT HEAD WITHOUT CONTRAST
TECHNIQUE: Contiguous axial images were obtained from the base of the skull
through the vertex without intravenous contrast.

[Series 2: head 5.0 h30s · axial · 0.46mm/px · z∈[-92,+43]mm · 8 of 33 slices shown, 10 images]
[im 3/33  brain]
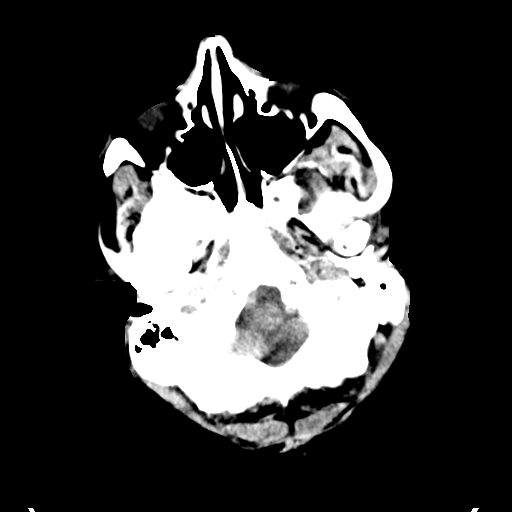
[im 3/33  bone]
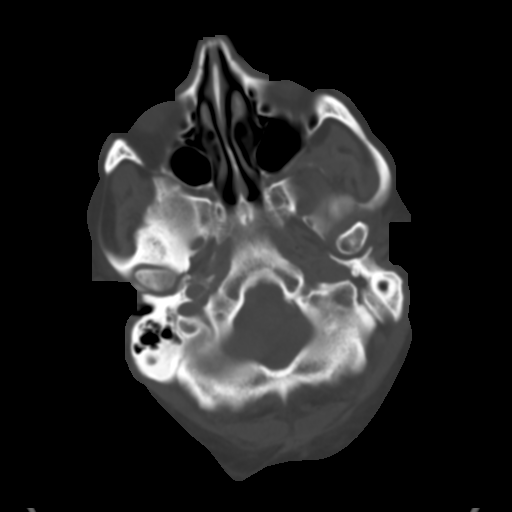
[im 7/33  brain]
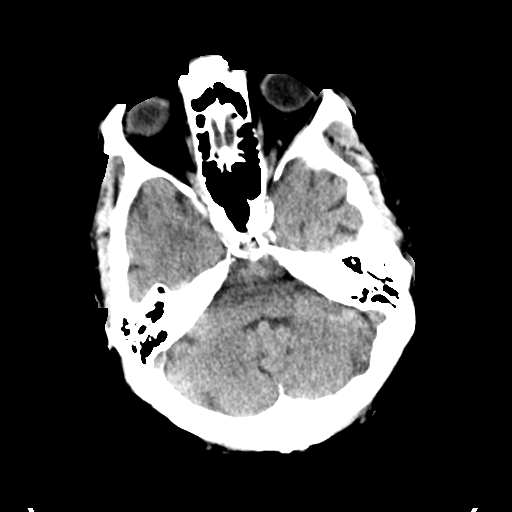
[im 10/33  brain]
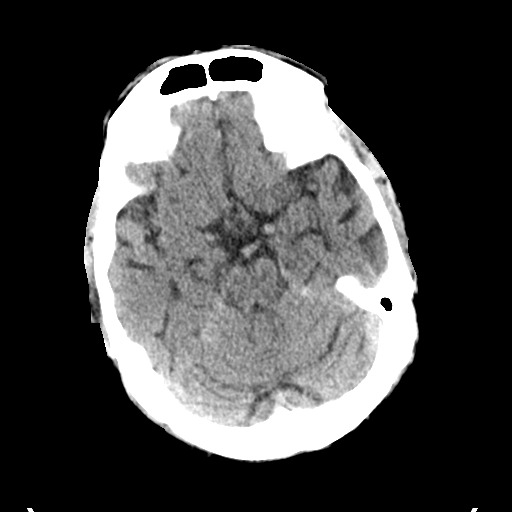
[im 15/33  brain]
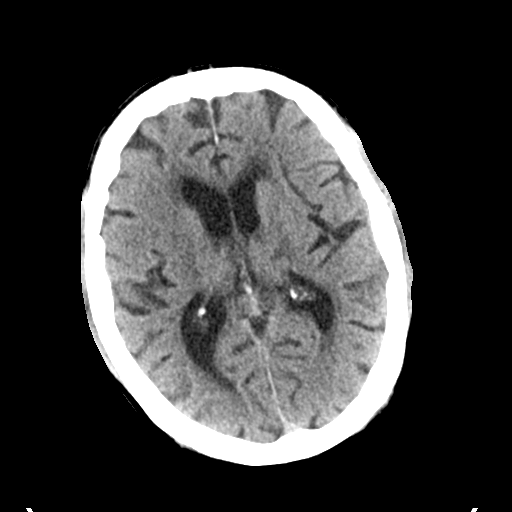
[im 18/33  brain]
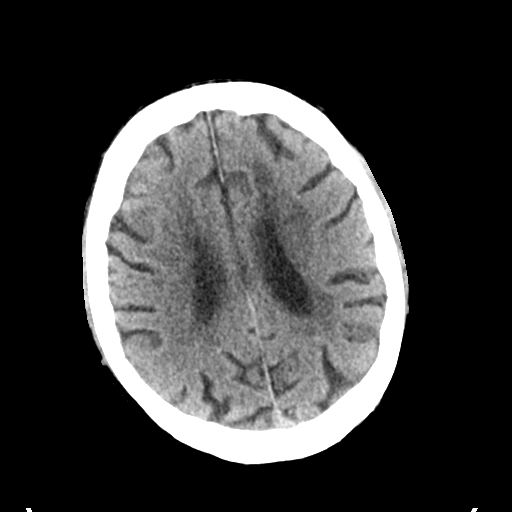
[im 18/33  bone]
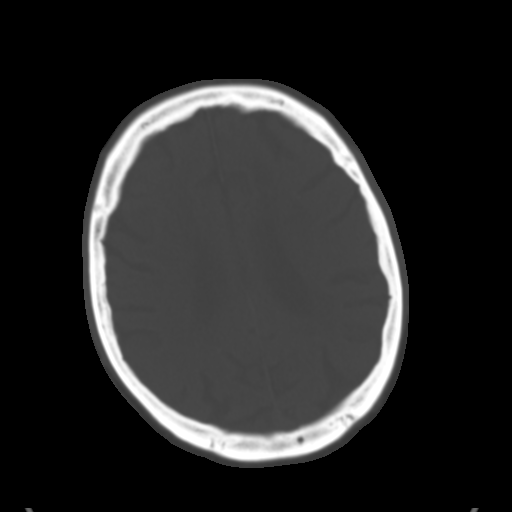
[im 23/33  brain]
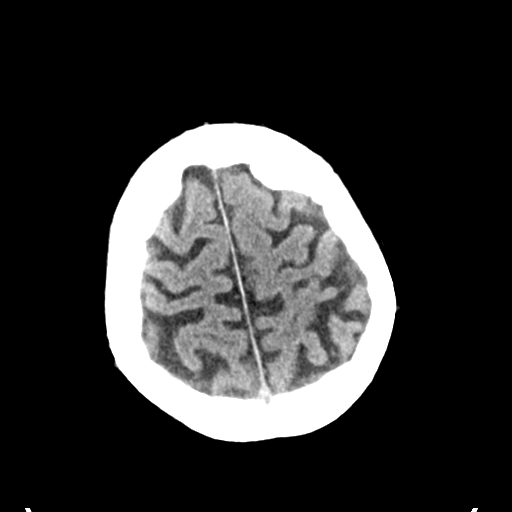
[im 26/33  brain]
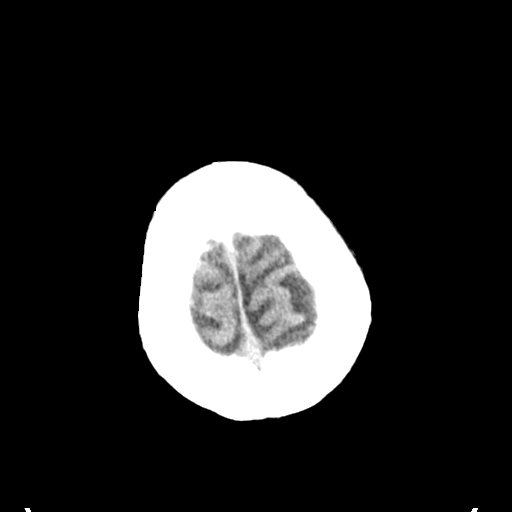
[im 30/33  brain]
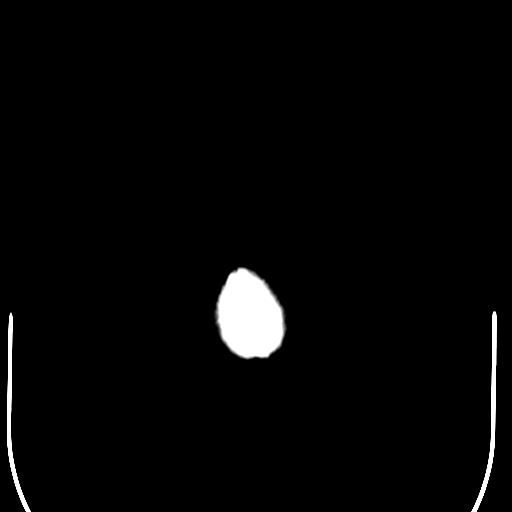

[Series 4: head 3.0 mpr cor · coronal · 0.35mm/px · 3 of 67 slices shown]
[im 23/67  brain]
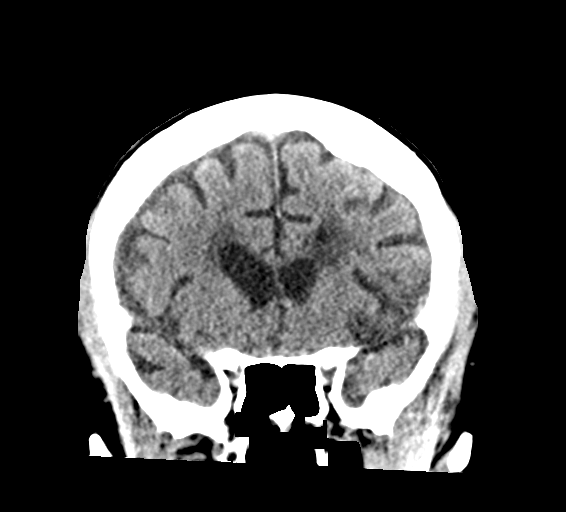
[im 30/67  brain]
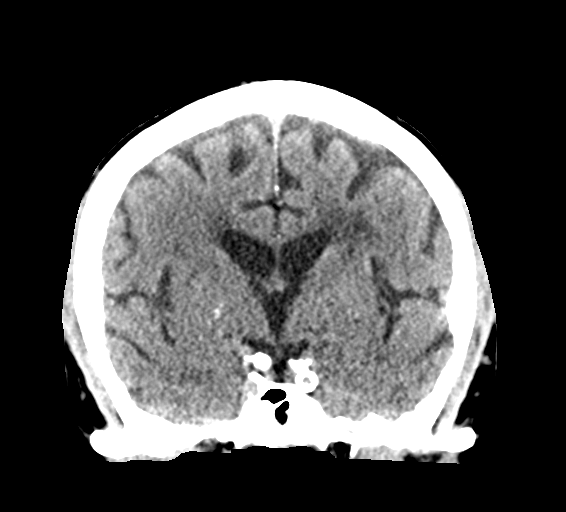
[im 37/67  brain]
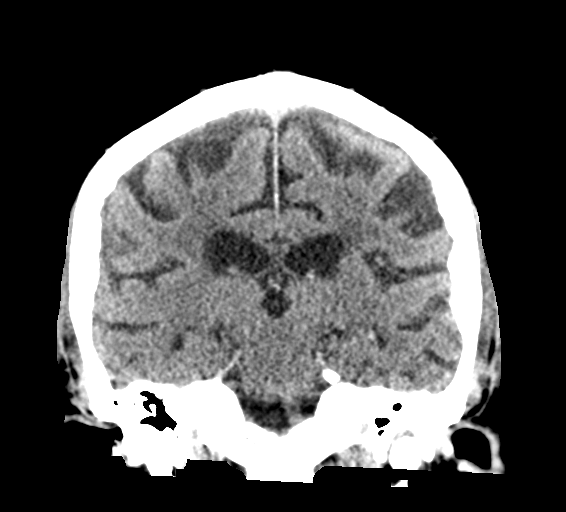

[Series 5: head 3.0 mpr sag · sagittal · 0.32mm/px · 3 of 67 slices shown]
[im 23/67  brain]
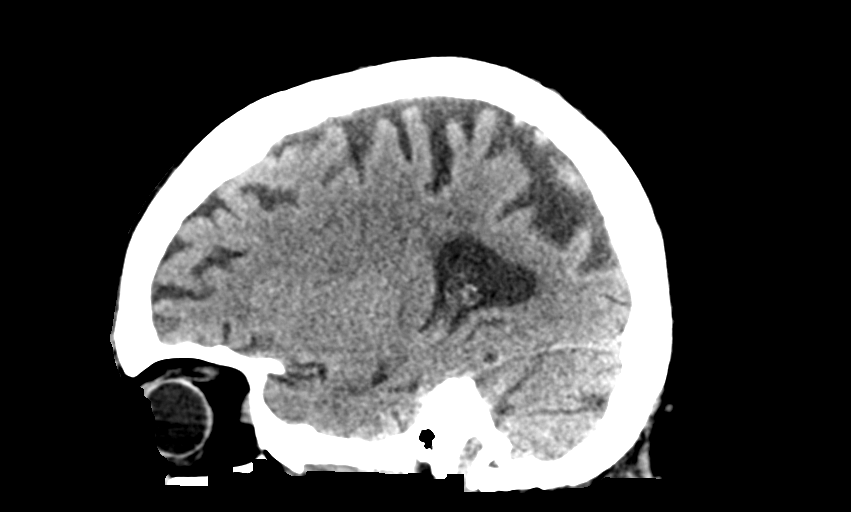
[im 34/67  brain]
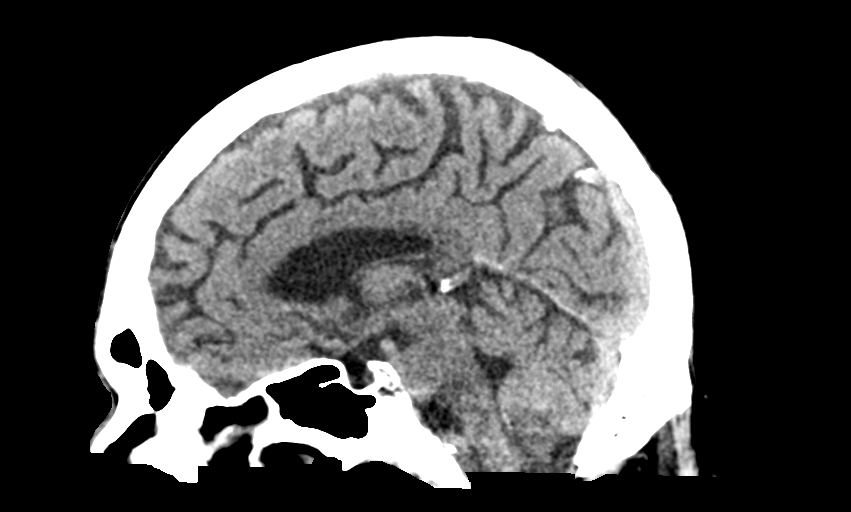
[im 45/67  brain]
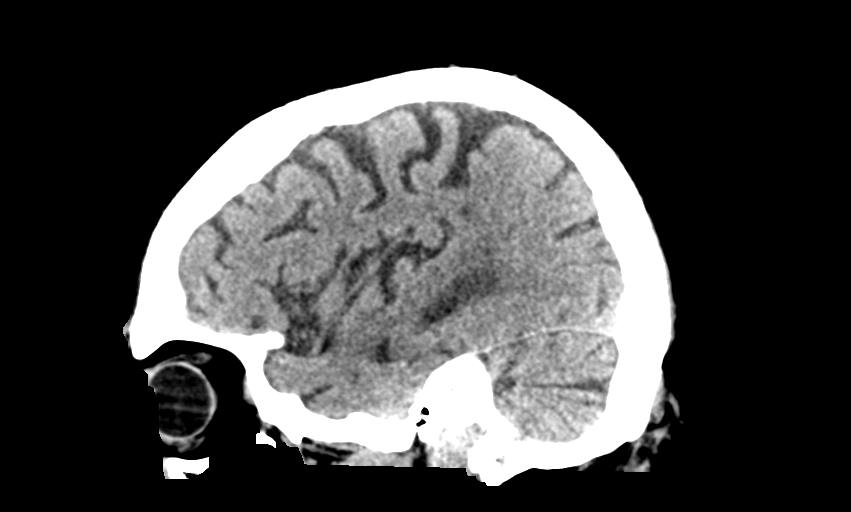

[14 of 47 positions shown; findings below may reference images not displayed]

FINDINGS: Brain: There is mild diffuse atrophy. There is no evident
intracranial mass, hemorrhage, extra-axial fluid collection, or
midline shift. There is patchy small vessel disease in the centra
semiovale bilaterally. Elsewhere gray-white compartments are normal.
No acute infarct evident.

Vascular: There is no hyperdense vessel. There is calcification in
each distal vertebral artery. There is also calcification in each
carotid siphon region.

Skull: The bony calvarium appears intact.

Sinuses/Orbits: Visualized paranasal sinuses clear. There is mild
rightward deviation of the nasal septum. Orbits appear symmetric
bilaterally.

Other: The mastoid air cells are clear. There is mild debris in the
right external auditory canal.
IMPRESSION: Mild atrophy with patchy periventricular small vessel disease. No
intracranial mass hemorrhage, or evidence of acute infarct. There
are foci of vascular calcification. There is probable cerumen left
external auditory canal.

## 2018-07-09 IMAGING — CR DG TOE GREAT 2+V*R*
3 series · 3 of 3 positions shown · non-contrast
Comparison: Right foot radiographs performed 09/22/2008

CLINICAL DATA: Stubbed right great toe on carpet, with diffuse
right great toe pain. Laceration at the root of the toenail. Initial
encounter.

EXAM:
RIGHT GREAT TOE

[toe ap]
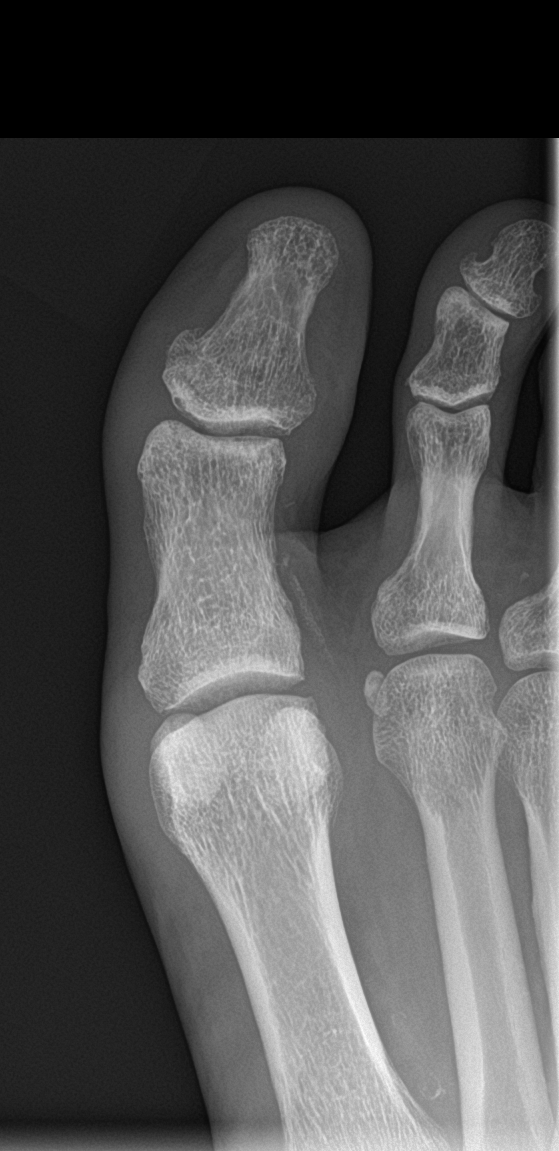

[toe obl]
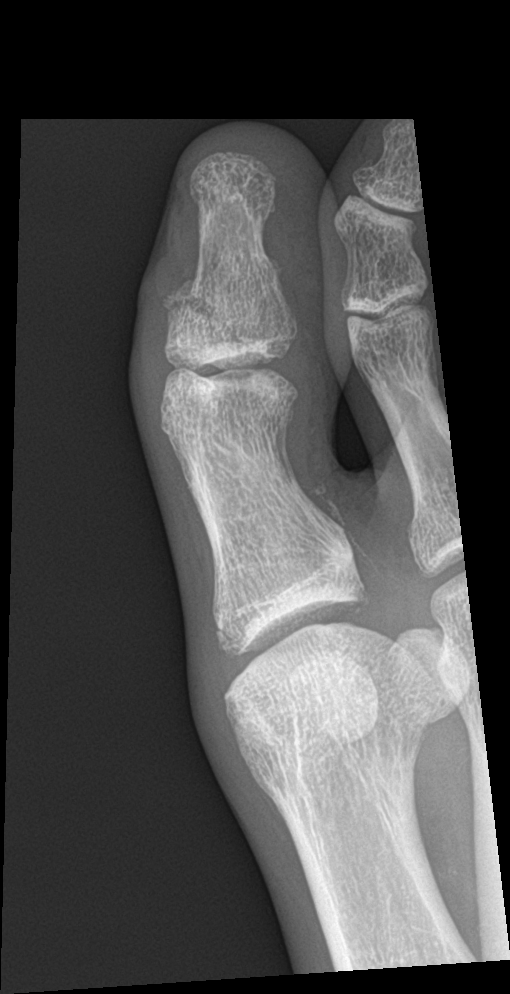

[toe lat]
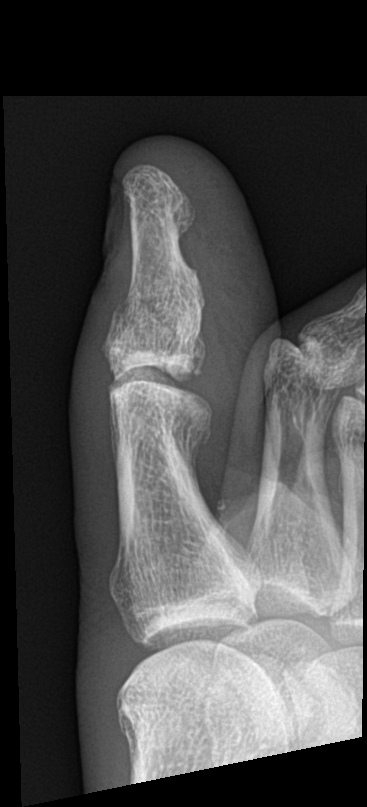

[3 of 3 positions shown; findings below may reference images not displayed]

FINDINGS: There is an oblique fracture through the medial base of the first
distal phalanx, with minimal displacement and mild overlying soft
tissue swelling. This extends to the first interphalangeal joint.

No additional fractures are seen. Scattered vascular calcifications
are noted.
IMPRESSION: 1. Oblique fracture through the medial base of the first distal
phalanx, with minimal displacement. This extends to the first
interphalangeal joint.
2. Scattered vascular calcifications seen.
